# Patient Record
Sex: Female | Born: 1960 | Race: Black or African American | Hispanic: No | Smoking: Former smoker
Health system: Southern US, Community
[De-identification: ages and names within clinical notes are randomized; demographics above are authoritative.]

## PROBLEM LIST (undated history)

## (undated) DIAGNOSIS — E1142 Type 2 diabetes mellitus with diabetic polyneuropathy: Secondary | ICD-10-CM

## (undated) DIAGNOSIS — J449 Chronic obstructive pulmonary disease, unspecified: Secondary | ICD-10-CM

## (undated) DIAGNOSIS — R42 Dizziness and giddiness: Secondary | ICD-10-CM

## (undated) DIAGNOSIS — R799 Abnormal finding of blood chemistry, unspecified: Secondary | ICD-10-CM

## (undated) DIAGNOSIS — Z973 Presence of spectacles and contact lenses: Secondary | ICD-10-CM

## (undated) DIAGNOSIS — E785 Hyperlipidemia, unspecified: Secondary | ICD-10-CM

## (undated) DIAGNOSIS — S82852A Displaced trimalleolar fracture of left lower leg, initial encounter for closed fracture: Secondary | ICD-10-CM

## (undated) DIAGNOSIS — M797 Fibromyalgia: Secondary | ICD-10-CM

## (undated) DIAGNOSIS — T8859XA Other complications of anesthesia, initial encounter: Secondary | ICD-10-CM

## (undated) DIAGNOSIS — D573 Sickle-cell trait: Secondary | ICD-10-CM

## (undated) DIAGNOSIS — R011 Cardiac murmur, unspecified: Secondary | ICD-10-CM

## (undated) DIAGNOSIS — T4145XA Adverse effect of unspecified anesthetic, initial encounter: Secondary | ICD-10-CM

## (undated) DIAGNOSIS — J42 Unspecified chronic bronchitis: Secondary | ICD-10-CM

## (undated) DIAGNOSIS — E0500 Thyrotoxicosis with diffuse goiter without thyrotoxic crisis or storm: Secondary | ICD-10-CM

## (undated) DIAGNOSIS — F419 Anxiety disorder, unspecified: Secondary | ICD-10-CM

## (undated) DIAGNOSIS — H9191 Unspecified hearing loss, right ear: Secondary | ICD-10-CM

## (undated) DIAGNOSIS — E039 Hypothyroidism, unspecified: Secondary | ICD-10-CM

## (undated) DIAGNOSIS — E119 Type 2 diabetes mellitus without complications: Secondary | ICD-10-CM

## (undated) DIAGNOSIS — L0390 Cellulitis, unspecified: Secondary | ICD-10-CM

## (undated) DIAGNOSIS — I10 Essential (primary) hypertension: Secondary | ICD-10-CM

## (undated) DIAGNOSIS — A630 Anogenital (venereal) warts: Secondary | ICD-10-CM

## (undated) DIAGNOSIS — M1990 Unspecified osteoarthritis, unspecified site: Secondary | ICD-10-CM

## (undated) DIAGNOSIS — R51 Headache: Secondary | ICD-10-CM

## (undated) HISTORY — PX: FRACTURE SURGERY: SHX138

## (undated) HISTORY — DX: Anogenital (venereal) warts: A63.0

## (undated) HISTORY — DX: Abnormal finding of blood chemistry, unspecified: R79.9

## (undated) HISTORY — DX: Unspecified hearing loss, right ear: H91.91

---

## 1984-09-30 HISTORY — PX: APPENDECTOMY: SHX54

## 1989-05-31 HISTORY — PX: DILATION AND CURETTAGE OF UTERUS: SHX78

## 1998-01-17 ENCOUNTER — Encounter: Admission: RE | Admit: 1998-01-17 | Discharge: 1998-01-17 | Payer: Self-pay | Admitting: Internal Medicine

## 1998-01-18 ENCOUNTER — Encounter: Admission: RE | Admit: 1998-01-18 | Discharge: 1998-01-18 | Payer: Self-pay | Admitting: Hematology and Oncology

## 1998-01-27 ENCOUNTER — Encounter: Admission: RE | Admit: 1998-01-27 | Discharge: 1998-01-27 | Payer: Self-pay | Admitting: Internal Medicine

## 1998-01-31 ENCOUNTER — Encounter: Admission: RE | Admit: 1998-01-31 | Discharge: 1998-05-01 | Payer: Self-pay | Admitting: Surgery

## 1998-02-28 ENCOUNTER — Encounter: Admission: RE | Admit: 1998-02-28 | Discharge: 1998-02-28 | Payer: Self-pay | Admitting: Hematology and Oncology

## 1998-05-17 ENCOUNTER — Encounter: Admission: RE | Admit: 1998-05-17 | Discharge: 1998-05-17 | Payer: Self-pay | Admitting: Internal Medicine

## 1998-10-05 ENCOUNTER — Encounter: Admission: RE | Admit: 1998-10-05 | Discharge: 1998-10-05 | Payer: Self-pay | Admitting: Internal Medicine

## 1998-11-01 ENCOUNTER — Encounter: Admission: RE | Admit: 1998-11-01 | Discharge: 1998-11-01 | Payer: Self-pay | Admitting: Surgery

## 1998-11-03 ENCOUNTER — Ambulatory Visit (HOSPITAL_COMMUNITY): Admission: RE | Admit: 1998-11-03 | Discharge: 1998-11-03 | Payer: Self-pay | Admitting: Internal Medicine

## 1998-11-03 ENCOUNTER — Encounter: Payer: Self-pay | Admitting: Internal Medicine

## 1998-11-20 ENCOUNTER — Encounter: Admission: RE | Admit: 1998-11-20 | Discharge: 1998-11-20 | Payer: Self-pay | Admitting: Internal Medicine

## 1999-01-03 ENCOUNTER — Encounter: Admission: RE | Admit: 1999-01-03 | Discharge: 1999-01-03 | Payer: Self-pay | Admitting: Internal Medicine

## 1999-01-04 ENCOUNTER — Encounter: Admission: RE | Admit: 1999-01-04 | Discharge: 1999-01-04 | Payer: Self-pay | Admitting: Hematology and Oncology

## 1999-06-13 ENCOUNTER — Encounter: Admission: RE | Admit: 1999-06-13 | Discharge: 1999-06-13 | Payer: Self-pay | Admitting: Hematology and Oncology

## 1999-10-31 ENCOUNTER — Encounter: Admission: RE | Admit: 1999-10-31 | Discharge: 1999-10-31 | Payer: Self-pay | Admitting: Hematology and Oncology

## 2000-03-21 ENCOUNTER — Encounter: Admission: RE | Admit: 2000-03-21 | Discharge: 2000-03-21 | Payer: Self-pay | Admitting: Internal Medicine

## 2000-04-18 ENCOUNTER — Encounter: Admission: RE | Admit: 2000-04-18 | Discharge: 2000-04-18 | Payer: Self-pay | Admitting: Internal Medicine

## 2000-10-10 ENCOUNTER — Encounter: Admission: RE | Admit: 2000-10-10 | Discharge: 2000-10-10 | Payer: Self-pay | Admitting: Internal Medicine

## 2000-10-10 ENCOUNTER — Ambulatory Visit (HOSPITAL_COMMUNITY): Admission: RE | Admit: 2000-10-10 | Discharge: 2000-10-10 | Payer: Self-pay | Admitting: Internal Medicine

## 2000-10-29 ENCOUNTER — Encounter: Admission: RE | Admit: 2000-10-29 | Discharge: 2000-10-29 | Payer: Self-pay | Admitting: Hematology and Oncology

## 2000-10-29 ENCOUNTER — Encounter: Admission: RE | Admit: 2000-10-29 | Discharge: 2001-01-27 | Payer: Self-pay | Admitting: *Deleted

## 2000-11-14 ENCOUNTER — Encounter: Admission: RE | Admit: 2000-11-14 | Discharge: 2000-11-14 | Payer: Self-pay | Admitting: Internal Medicine

## 2001-01-16 ENCOUNTER — Encounter: Admission: RE | Admit: 2001-01-16 | Discharge: 2001-01-16 | Payer: Self-pay

## 2001-03-05 ENCOUNTER — Encounter: Admission: RE | Admit: 2001-03-05 | Discharge: 2001-03-05 | Payer: Self-pay | Admitting: Obstetrics

## 2001-05-25 ENCOUNTER — Encounter: Admission: RE | Admit: 2001-05-25 | Discharge: 2001-05-25 | Payer: Self-pay | Admitting: Internal Medicine

## 2001-10-06 ENCOUNTER — Encounter: Admission: RE | Admit: 2001-10-06 | Discharge: 2001-10-06 | Payer: Self-pay | Admitting: Obstetrics & Gynecology

## 2001-10-19 ENCOUNTER — Encounter: Admission: RE | Admit: 2001-10-19 | Discharge: 2001-10-19 | Payer: Self-pay

## 2002-07-13 ENCOUNTER — Encounter: Admission: RE | Admit: 2002-07-13 | Discharge: 2002-07-13 | Payer: Self-pay | Admitting: Internal Medicine

## 2002-07-20 ENCOUNTER — Encounter: Admission: RE | Admit: 2002-07-20 | Discharge: 2002-07-20 | Payer: Self-pay | Admitting: Internal Medicine

## 2002-08-10 ENCOUNTER — Encounter: Admission: RE | Admit: 2002-08-10 | Discharge: 2002-08-10 | Payer: Self-pay | Admitting: Internal Medicine

## 2002-12-28 ENCOUNTER — Encounter: Admission: RE | Admit: 2002-12-28 | Discharge: 2002-12-28 | Payer: Self-pay | Admitting: Internal Medicine

## 2002-12-28 ENCOUNTER — Ambulatory Visit (HOSPITAL_COMMUNITY): Admission: RE | Admit: 2002-12-28 | Discharge: 2002-12-28 | Payer: Self-pay | Admitting: Internal Medicine

## 2003-01-11 ENCOUNTER — Encounter: Admission: RE | Admit: 2003-01-11 | Discharge: 2003-01-11 | Payer: Self-pay | Admitting: Internal Medicine

## 2003-01-25 ENCOUNTER — Ambulatory Visit (HOSPITAL_COMMUNITY): Admission: RE | Admit: 2003-01-25 | Discharge: 2003-01-25 | Payer: Self-pay | Admitting: Internal Medicine

## 2003-02-04 ENCOUNTER — Encounter: Admission: RE | Admit: 2003-02-04 | Discharge: 2003-02-04 | Payer: Self-pay | Admitting: Internal Medicine

## 2003-03-11 ENCOUNTER — Encounter: Admission: RE | Admit: 2003-03-11 | Discharge: 2003-03-11 | Payer: Self-pay | Admitting: Internal Medicine

## 2003-05-03 ENCOUNTER — Encounter: Admission: RE | Admit: 2003-05-03 | Discharge: 2003-05-03 | Payer: Self-pay | Admitting: Internal Medicine

## 2003-05-12 ENCOUNTER — Ambulatory Visit (HOSPITAL_COMMUNITY): Admission: RE | Admit: 2003-05-12 | Discharge: 2003-05-12 | Payer: Self-pay | Admitting: Internal Medicine

## 2004-01-05 ENCOUNTER — Encounter: Admission: RE | Admit: 2004-01-05 | Discharge: 2004-01-05 | Payer: Self-pay | Admitting: Internal Medicine

## 2004-01-05 ENCOUNTER — Ambulatory Visit (HOSPITAL_COMMUNITY): Admission: RE | Admit: 2004-01-05 | Discharge: 2004-01-05 | Payer: Self-pay | Admitting: Internal Medicine

## 2004-01-10 ENCOUNTER — Encounter: Admission: RE | Admit: 2004-01-10 | Discharge: 2004-01-10 | Payer: Self-pay | Admitting: Internal Medicine

## 2004-02-16 ENCOUNTER — Encounter: Admission: RE | Admit: 2004-02-16 | Discharge: 2004-02-16 | Payer: Self-pay | Admitting: Internal Medicine

## 2004-02-22 ENCOUNTER — Encounter: Admission: RE | Admit: 2004-02-22 | Discharge: 2004-02-22 | Payer: Self-pay | Admitting: Internal Medicine

## 2004-04-09 ENCOUNTER — Encounter: Admission: RE | Admit: 2004-04-09 | Discharge: 2004-04-09 | Payer: Self-pay | Admitting: Internal Medicine

## 2004-04-09 ENCOUNTER — Ambulatory Visit: Payer: Self-pay | Admitting: Internal Medicine

## 2004-10-29 ENCOUNTER — Ambulatory Visit: Payer: Self-pay | Admitting: Family Medicine

## 2005-04-18 ENCOUNTER — Ambulatory Visit: Payer: Self-pay | Admitting: Internal Medicine

## 2005-09-17 ENCOUNTER — Ambulatory Visit: Payer: Self-pay | Admitting: Internal Medicine

## 2005-09-19 ENCOUNTER — Ambulatory Visit: Payer: Self-pay | Admitting: Internal Medicine

## 2005-12-05 ENCOUNTER — Ambulatory Visit: Payer: Self-pay | Admitting: Internal Medicine

## 2006-05-08 ENCOUNTER — Ambulatory Visit: Payer: Self-pay | Admitting: Hospitalist

## 2006-05-16 ENCOUNTER — Ambulatory Visit (HOSPITAL_COMMUNITY): Admission: RE | Admit: 2006-05-16 | Discharge: 2006-05-16 | Payer: Self-pay | Admitting: Hospitalist

## 2006-07-31 DIAGNOSIS — G4700 Insomnia, unspecified: Secondary | ICD-10-CM

## 2006-07-31 DIAGNOSIS — E1165 Type 2 diabetes mellitus with hyperglycemia: Secondary | ICD-10-CM

## 2006-07-31 DIAGNOSIS — R809 Proteinuria, unspecified: Secondary | ICD-10-CM

## 2006-07-31 DIAGNOSIS — J988 Other specified respiratory disorders: Secondary | ICD-10-CM | POA: Insufficient documentation

## 2006-07-31 DIAGNOSIS — F32A Depression, unspecified: Secondary | ICD-10-CM | POA: Insufficient documentation

## 2006-07-31 DIAGNOSIS — E1140 Type 2 diabetes mellitus with diabetic neuropathy, unspecified: Secondary | ICD-10-CM | POA: Insufficient documentation

## 2006-07-31 DIAGNOSIS — F329 Major depressive disorder, single episode, unspecified: Secondary | ICD-10-CM

## 2006-07-31 DIAGNOSIS — M25519 Pain in unspecified shoulder: Secondary | ICD-10-CM | POA: Insufficient documentation

## 2006-07-31 DIAGNOSIS — D573 Sickle-cell trait: Secondary | ICD-10-CM | POA: Insufficient documentation

## 2006-07-31 DIAGNOSIS — G5600 Carpal tunnel syndrome, unspecified upper limb: Secondary | ICD-10-CM | POA: Insufficient documentation

## 2006-07-31 DIAGNOSIS — M7150 Other bursitis, not elsewhere classified, unspecified site: Secondary | ICD-10-CM | POA: Insufficient documentation

## 2006-07-31 HISTORY — DX: Proteinuria, unspecified: R80.9

## 2006-07-31 HISTORY — DX: Insomnia, unspecified: G47.00

## 2006-08-14 ENCOUNTER — Ambulatory Visit: Payer: Self-pay | Admitting: Hospitalist

## 2006-08-14 LAB — CONVERTED CEMR LAB
BUN: 9 mg/dL (ref 6–23)
CO2: 24 meq/L (ref 19–32)
Calcium: 10.1 mg/dL (ref 8.4–10.5)
Chloride: 104 meq/L (ref 96–112)
Cholesterol: 147 mg/dL (ref 0–200)
Creatinine, Ser: 0.6 mg/dL (ref 0.40–1.20)
Glucose, Bld: 104 mg/dL — ABNORMAL HIGH (ref 70–99)
HDL: 52 mg/dL (ref >39)
LDL Cholesterol: 85 mg/dL (ref 0–99)
Potassium: 4 meq/L (ref 3.5–5.3)
Sodium: 136 meq/L (ref 135–145)
TSH: 0.092 microintl units/mL — ABNORMAL LOW (ref 0.350–5.50)
Total CHOL/HDL Ratio: 2.8
Triglycerides: 49 mg/dL (ref <150)
VLDL: 10 mg/dL (ref 0–40)

## 2006-09-30 HISTORY — PX: ANKLE FRACTURE SURGERY: SHX122

## 2006-10-01 ENCOUNTER — Emergency Department (HOSPITAL_COMMUNITY): Admission: EM | Admit: 2006-10-01 | Discharge: 2006-10-02 | Payer: Self-pay | Admitting: Emergency Medicine

## 2006-10-13 DIAGNOSIS — M1289 Other specific arthropathies, not elsewhere classified, multiple sites: Secondary | ICD-10-CM | POA: Insufficient documentation

## 2006-10-13 DIAGNOSIS — D649 Anemia, unspecified: Secondary | ICD-10-CM | POA: Insufficient documentation

## 2006-10-13 DIAGNOSIS — E890 Postprocedural hypothyroidism: Secondary | ICD-10-CM | POA: Insufficient documentation

## 2006-11-25 ENCOUNTER — Telehealth (INDEPENDENT_AMBULATORY_CARE_PROVIDER_SITE_OTHER): Payer: Self-pay | Admitting: *Deleted

## 2007-01-05 ENCOUNTER — Telehealth: Payer: Self-pay | Admitting: *Deleted

## 2007-03-03 ENCOUNTER — Encounter (INDEPENDENT_AMBULATORY_CARE_PROVIDER_SITE_OTHER): Payer: Self-pay | Admitting: Hospitalist

## 2007-03-24 ENCOUNTER — Ambulatory Visit: Payer: Self-pay | Admitting: Hospitalist

## 2007-03-24 LAB — CONVERTED CEMR LAB
Blood Glucose, Fingerstick: 102
Hgb A1c MFr Bld: 8.5 %

## 2007-03-30 DIAGNOSIS — E785 Hyperlipidemia, unspecified: Secondary | ICD-10-CM | POA: Insufficient documentation

## 2007-03-30 LAB — CONVERTED CEMR LAB
ALT: 14 units/L (ref 0–35)
AST: 15 units/L (ref 0–37)
Albumin: 4.3 g/dL (ref 3.5–5.2)
Alkaline Phosphatase: 109 units/L (ref 39–117)
BUN: 7 mg/dL (ref 6–23)
Basophils Absolute: 0.1 10*3/uL (ref 0.0–0.1)
Basophils Relative: 1 % (ref 0–1)
CO2: 24 meq/L (ref 19–32)
Calcium: 9.9 mg/dL (ref 8.4–10.5)
Chloride: 105 meq/L (ref 96–112)
Cholesterol: 189 mg/dL (ref 0–200)
Creatinine, Ser: 0.74 mg/dL (ref 0.40–1.20)
Creatinine, Urine: 174.3 mg/dL
Eosinophils Absolute: 0.1 10*3/uL (ref 0.0–0.7)
Eosinophils Relative: 1 % (ref 0–5)
Glucose, Bld: 78 mg/dL (ref 70–99)
HCT: 37.9 % (ref 36.0–46.0)
HDL: 51 mg/dL (ref >39)
Hemoglobin: 12.6 g/dL (ref 12.0–15.0)
LDL Cholesterol: 122 mg/dL — ABNORMAL HIGH (ref 0–99)
Lymphocytes Relative: 30 % (ref 12–46)
Lymphs Abs: 2.7 10*3/uL (ref 0.7–3.3)
MCHC: 33.2 g/dL (ref 30.0–36.0)
MCV: 78 fL (ref 78.0–100.0)
Microalb Creat Ratio: 191.6 mg/g — ABNORMAL HIGH (ref 0.0–30.0)
Microalb, Ur: 33.4 mg/dL — ABNORMAL HIGH (ref 0.00–1.89)
Monocytes Absolute: 0.6 10*3/uL (ref 0.2–0.7)
Monocytes Relative: 7 % (ref 3–11)
Neutro Abs: 5.7 10*3/uL (ref 1.7–7.7)
Neutrophils Relative %: 62 % (ref 43–77)
Platelets: 249 10*3/uL (ref 150–400)
Potassium: 3.7 meq/L (ref 3.5–5.3)
RBC: 4.86 M/uL (ref 3.87–5.11)
RDW: 15.6 % — ABNORMAL HIGH (ref 11.5–14.0)
Sodium: 139 meq/L (ref 135–145)
TSH: 20.086 microintl units/mL — ABNORMAL HIGH (ref 0.350–5.50)
Total Bilirubin: 1.3 mg/dL — ABNORMAL HIGH (ref 0.3–1.2)
Total CHOL/HDL Ratio: 3.7
Total Protein: 7.9 g/dL (ref 6.0–8.3)
Triglycerides: 82 mg/dL (ref <150)
VLDL: 16 mg/dL (ref 0–40)
WBC: 9.2 10*3/uL (ref 4.0–10.5)

## 2007-04-01 ENCOUNTER — Ambulatory Visit (HOSPITAL_COMMUNITY): Admission: RE | Admit: 2007-04-01 | Discharge: 2007-04-01 | Payer: Self-pay | Admitting: Internal Medicine

## 2007-09-09 ENCOUNTER — Telehealth: Payer: Self-pay | Admitting: *Deleted

## 2007-09-21 ENCOUNTER — Telehealth: Payer: Self-pay | Admitting: *Deleted

## 2007-12-08 ENCOUNTER — Ambulatory Visit: Payer: Self-pay | Admitting: Hospitalist

## 2007-12-08 DIAGNOSIS — G619 Inflammatory polyneuropathy, unspecified: Secondary | ICD-10-CM | POA: Insufficient documentation

## 2007-12-08 DIAGNOSIS — J40 Bronchitis, not specified as acute or chronic: Secondary | ICD-10-CM | POA: Insufficient documentation

## 2007-12-08 DIAGNOSIS — G622 Polyneuropathy due to other toxic agents: Secondary | ICD-10-CM

## 2007-12-08 LAB — CONVERTED CEMR LAB
Blood Glucose, Fingerstick: 168
Hgb A1c MFr Bld: 8.1 %

## 2007-12-14 ENCOUNTER — Encounter (INDEPENDENT_AMBULATORY_CARE_PROVIDER_SITE_OTHER): Payer: Self-pay | Admitting: Hospitalist

## 2007-12-16 ENCOUNTER — Ambulatory Visit: Payer: Self-pay | Admitting: Hospitalist

## 2007-12-18 LAB — CONVERTED CEMR LAB
ALT: 15 units/L (ref 0–35)
AST: 17 units/L (ref 0–37)
Albumin: 4.3 g/dL (ref 3.5–5.2)
Alkaline Phosphatase: 99 units/L (ref 39–117)
BUN: 12 mg/dL (ref 6–23)
CO2: 22 meq/L (ref 19–32)
Calcium: 10.1 mg/dL (ref 8.4–10.5)
Chloride: 105 meq/L (ref 96–112)
Cholesterol: 185 mg/dL (ref 0–200)
Creatinine, Ser: 0.65 mg/dL (ref 0.40–1.20)
Glucose, Bld: 154 mg/dL — ABNORMAL HIGH (ref 70–99)
HCT: 36.5 % (ref 36.0–46.0)
HDL: 56 mg/dL (ref >39)
Hemoglobin: 11.9 g/dL — ABNORMAL LOW (ref 12.0–15.0)
LDL Cholesterol: 118 mg/dL — ABNORMAL HIGH (ref 0–99)
MCHC: 32.6 g/dL (ref 30.0–36.0)
MCV: 80.6 fL (ref 78.0–100.0)
Platelets: 281 10*3/uL (ref 150–400)
Potassium: 4.2 meq/L (ref 3.5–5.3)
RBC: 4.53 M/uL (ref 3.87–5.11)
RDW: 14.8 % (ref 11.5–15.5)
Sodium: 136 meq/L (ref 135–145)
TSH: 13.379 microintl units/mL — ABNORMAL HIGH (ref 0.350–5.50)
Total Bilirubin: 1 mg/dL (ref 0.3–1.2)
Total CHOL/HDL Ratio: 3.3
Total Protein: 8 g/dL (ref 6.0–8.3)
Triglycerides: 57 mg/dL (ref <150)
VLDL: 11 mg/dL (ref 0–40)
WBC: 7.6 10*3/uL (ref 4.0–10.5)

## 2008-02-15 ENCOUNTER — Encounter (INDEPENDENT_AMBULATORY_CARE_PROVIDER_SITE_OTHER): Payer: Self-pay | Admitting: Hospitalist

## 2008-06-10 ENCOUNTER — Telehealth: Payer: Self-pay | Admitting: Internal Medicine

## 2008-06-13 ENCOUNTER — Encounter (INDEPENDENT_AMBULATORY_CARE_PROVIDER_SITE_OTHER): Payer: Self-pay | Admitting: Internal Medicine

## 2008-06-13 ENCOUNTER — Ambulatory Visit: Payer: Self-pay | Admitting: Internal Medicine

## 2008-06-13 ENCOUNTER — Encounter: Payer: Self-pay | Admitting: Internal Medicine

## 2008-06-13 LAB — CONVERTED CEMR LAB
Blood Glucose, AC Bkfst: 62 mg/dL
Hgb A1c MFr Bld: 10.2 %

## 2008-06-14 LAB — CONVERTED CEMR LAB
Basophils Absolute: 0.1 10*3/uL (ref 0.0–0.1)
Basophils Relative: 1 % (ref 0–1)
Creatinine, Urine: 177.1 mg/dL
Eosinophils Absolute: 0.1 10*3/uL (ref 0.0–0.7)
Eosinophils Relative: 1 % (ref 0–5)
HCT: 36.1 % (ref 36.0–46.0)
Hemoglobin: 11.6 g/dL — ABNORMAL LOW (ref 12.0–15.0)
Lymphocytes Relative: 16 % (ref 12–46)
Lymphs Abs: 1.5 10*3/uL (ref 0.7–4.0)
MCHC: 32.1 g/dL (ref 30.0–36.0)
MCV: 79.2 fL (ref 78.0–100.0)
Microalb Creat Ratio: 129.9 mg/g — ABNORMAL HIGH (ref 0.0–30.0)
Microalb, Ur: 23 mg/dL — ABNORMAL HIGH (ref 0.00–1.89)
Monocytes Absolute: 0.8 10*3/uL (ref 0.1–1.0)
Monocytes Relative: 8 % (ref 3–12)
Neutro Abs: 7.2 10*3/uL (ref 1.7–7.7)
Neutrophils Relative %: 75 % (ref 43–77)
Platelets: 246 10*3/uL (ref 150–400)
RBC: 4.56 M/uL (ref 3.87–5.11)
RDW: 15.7 % — ABNORMAL HIGH (ref 11.5–15.5)
TSH: 12.641 microintl units/mL — ABNORMAL HIGH (ref 0.350–4.50)
WBC: 9.6 10*3/uL (ref 4.0–10.5)

## 2008-06-15 ENCOUNTER — Telehealth (INDEPENDENT_AMBULATORY_CARE_PROVIDER_SITE_OTHER): Payer: Self-pay | Admitting: *Deleted

## 2008-06-20 ENCOUNTER — Telehealth: Payer: Self-pay | Admitting: Internal Medicine

## 2008-07-07 ENCOUNTER — Encounter: Payer: Self-pay | Admitting: Internal Medicine

## 2008-07-07 ENCOUNTER — Ambulatory Visit: Payer: Self-pay | Admitting: Infectious Disease

## 2008-07-07 LAB — CONVERTED CEMR LAB
Blood Glucose, Fingerstick: 100
Blood Glucose, Home Monitor: 1 mg/dL

## 2008-07-11 LAB — CONVERTED CEMR LAB
Cholesterol: 166 mg/dL (ref 0–200)
Free T4: 1.63 ng/dL (ref 0.89–1.80)
HDL: 60 mg/dL (ref >39)
IgE (Immunoglobulin E), Serum: 8.2 intl units/mL (ref 0.0–180.0)
LDL Cholesterol: 96 mg/dL (ref 0–99)
T3, Free: 2.4 pg/mL (ref 2.3–4.2)
Total CHOL/HDL Ratio: 2.8
Triglycerides: 48 mg/dL (ref <150)
VLDL: 10 mg/dL (ref 0–40)

## 2008-07-19 ENCOUNTER — Encounter: Payer: Self-pay | Admitting: Internal Medicine

## 2008-07-21 ENCOUNTER — Encounter: Payer: Self-pay | Admitting: Internal Medicine

## 2008-07-21 ENCOUNTER — Ambulatory Visit: Payer: Self-pay | Admitting: Internal Medicine

## 2008-07-21 LAB — CONVERTED CEMR LAB
Anti Nuclear Antibody(ANA): NEGATIVE
BUN: 6 mg/dL (ref 6–23)
Blood Glucose, Fingerstick: 171
CO2: 26 meq/L (ref 19–32)
Calcium: 9.6 mg/dL (ref 8.4–10.5)
Chloride: 103 meq/L (ref 96–112)
Creatinine, Ser: 0.55 mg/dL (ref 0.40–1.20)
Glucose, Bld: 167 mg/dL — ABNORMAL HIGH (ref 70–99)
Potassium: 3.6 meq/L (ref 3.5–5.3)
Sed Rate: 15 mm/hr (ref 0–22)
Sodium: 135 meq/L (ref 135–145)

## 2008-07-26 ENCOUNTER — Ambulatory Visit (HOSPITAL_COMMUNITY): Admission: RE | Admit: 2008-07-26 | Discharge: 2008-07-26 | Payer: Self-pay | Admitting: Internal Medicine

## 2008-07-27 ENCOUNTER — Ambulatory Visit (HOSPITAL_COMMUNITY): Admission: RE | Admit: 2008-07-27 | Discharge: 2008-07-27 | Payer: Self-pay | Admitting: Internal Medicine

## 2008-09-08 ENCOUNTER — Ambulatory Visit: Payer: Self-pay | Admitting: Internal Medicine

## 2008-09-08 DIAGNOSIS — E663 Overweight: Secondary | ICD-10-CM | POA: Insufficient documentation

## 2008-09-08 LAB — CONVERTED CEMR LAB
Blood Glucose, Fingerstick: 326
Hgb A1c MFr Bld: 9.2 %

## 2008-10-21 ENCOUNTER — Telehealth (INDEPENDENT_AMBULATORY_CARE_PROVIDER_SITE_OTHER): Payer: Self-pay | Admitting: *Deleted

## 2008-12-12 ENCOUNTER — Telehealth: Payer: Self-pay | Admitting: Internal Medicine

## 2009-01-17 ENCOUNTER — Ambulatory Visit: Payer: Self-pay | Admitting: Internal Medicine

## 2009-01-17 LAB — CONVERTED CEMR LAB
Bilirubin Urine: NEGATIVE
Glucose, Urine, Semiquant: >=1000
Ketones, urine, test strip: NEGATIVE
Nitrite: NEGATIVE
Protein, U semiquant: 30
Specific Gravity, Urine: 1.01
Urobilinogen, UA: 0.2
WBC Urine, dipstick: NEGATIVE
pH: 5.5

## 2009-03-07 ENCOUNTER — Telehealth: Payer: Self-pay | Admitting: Internal Medicine

## 2009-03-20 ENCOUNTER — Telehealth (INDEPENDENT_AMBULATORY_CARE_PROVIDER_SITE_OTHER): Payer: Self-pay | Admitting: *Deleted

## 2009-06-21 ENCOUNTER — Encounter: Payer: Self-pay | Admitting: Internal Medicine

## 2009-06-21 ENCOUNTER — Ambulatory Visit: Payer: Self-pay | Admitting: Internal Medicine

## 2009-06-21 DIAGNOSIS — K644 Residual hemorrhoidal skin tags: Secondary | ICD-10-CM | POA: Insufficient documentation

## 2009-06-21 LAB — CONVERTED CEMR LAB
Beta hcg, urine, semiquantitative: NEGATIVE
Bilirubin Urine: NEGATIVE
Blood Glucose, Fingerstick: 307
Glucose, Urine, Semiquant: >=1000
Hgb A1c MFr Bld: 10.7 %
Nitrite: POSITIVE
Specific Gravity, Urine: 1.02
Urobilinogen, UA: 0.2
WBC Urine, dipstick: NEGATIVE
pH: 5

## 2009-06-22 ENCOUNTER — Encounter: Payer: Self-pay | Admitting: Internal Medicine

## 2009-06-22 LAB — CONVERTED CEMR LAB
Candida species: NEGATIVE
Chlamydia, DNA Probe: NEGATIVE
GC Probe Amp, Genital: NEGATIVE
Gardnerella vaginalis: NEGATIVE

## 2009-06-26 ENCOUNTER — Telehealth (INDEPENDENT_AMBULATORY_CARE_PROVIDER_SITE_OTHER): Payer: Self-pay | Admitting: *Deleted

## 2009-06-26 DIAGNOSIS — R87619 Unspecified abnormal cytological findings in specimens from cervix uteri: Secondary | ICD-10-CM | POA: Insufficient documentation

## 2009-06-28 ENCOUNTER — Telehealth: Payer: Self-pay | Admitting: *Deleted

## 2009-07-05 ENCOUNTER — Ambulatory Visit: Payer: Self-pay | Admitting: Internal Medicine

## 2009-07-05 ENCOUNTER — Encounter: Payer: Self-pay | Admitting: Internal Medicine

## 2009-07-05 LAB — CONVERTED CEMR LAB: Blood Glucose, Fingerstick: 170

## 2009-07-06 ENCOUNTER — Encounter: Payer: Self-pay | Admitting: Internal Medicine

## 2009-07-06 LAB — CONVERTED CEMR LAB
Candida species: NEGATIVE
Gardnerella vaginalis: NEGATIVE

## 2009-07-19 ENCOUNTER — Other Ambulatory Visit: Admission: RE | Admit: 2009-07-19 | Discharge: 2009-07-19 | Payer: Self-pay | Admitting: Obstetrics and Gynecology

## 2009-07-19 ENCOUNTER — Ambulatory Visit: Payer: Self-pay | Admitting: Obstetrics and Gynecology

## 2009-10-19 ENCOUNTER — Telehealth: Payer: Self-pay | Admitting: Internal Medicine

## 2009-10-27 ENCOUNTER — Telehealth: Payer: Self-pay | Admitting: Internal Medicine

## 2009-11-13 ENCOUNTER — Ambulatory Visit: Payer: Self-pay | Admitting: Internal Medicine

## 2009-11-13 LAB — CONVERTED CEMR LAB
ALT: 14 units/L (ref 0–35)
AST: 18 units/L (ref 0–37)
Albumin: 4 g/dL (ref 3.5–5.2)
Alkaline Phosphatase: 95 units/L (ref 39–117)
BUN: 7 mg/dL (ref 6–23)
Basophils Absolute: 0 10*3/uL (ref 0.0–0.1)
Basophils Relative: 1 % (ref 0–1)
Bilirubin Urine: NEGATIVE
Blood Glucose, AC Bkfst: 222 mg/dL
CO2: 23 meq/L (ref 19–32)
Calcium: 9.7 mg/dL (ref 8.4–10.5)
Casts: NONE SEEN /lpf
Chloride: 104 meq/L (ref 96–112)
Cholesterol: 174 mg/dL (ref 0–200)
Creatinine, Ser: 0.59 mg/dL (ref 0.40–1.20)
Creatinine, Urine: 121.3 mg/dL
Crystals: NONE SEEN
Eosinophils Absolute: 0.2 10*3/uL (ref 0.0–0.7)
Eosinophils Relative: 3 % (ref 0–5)
Glucose, Bld: 220 mg/dL — ABNORMAL HIGH (ref 70–99)
HCT: 30.9 % — ABNORMAL LOW (ref 36.0–46.0)
HDL: 46 mg/dL (ref >39)
Hemoglobin: 9.8 g/dL — ABNORMAL LOW (ref 12.0–15.0)
Hgb A1c MFr Bld: 8.4 %
Ketones, ur: 15 mg/dL — AB
LDL Cholesterol: 113 mg/dL — ABNORMAL HIGH (ref 0–99)
Leukocytes, UA: NEGATIVE
Lymphocytes Relative: 24 % (ref 12–46)
Lymphs Abs: 1.9 10*3/uL (ref 0.7–4.0)
MCHC: 31.7 g/dL (ref 30.0–36.0)
MCV: 71.4 fL — ABNORMAL LOW (ref >=78.0)
Microalb Creat Ratio: 135.4 mg/g — ABNORMAL HIGH (ref 0.0–30.0)
Microalb, Ur: 16.43 mg/dL — ABNORMAL HIGH (ref 0.00–1.89)
Monocytes Absolute: 0.7 10*3/uL (ref 0.1–1.0)
Monocytes Relative: 8 % (ref 3–12)
Neutro Abs: 5.1 10*3/uL (ref 1.7–7.7)
Neutrophils Relative %: 65 % (ref 43–77)
Nitrite: POSITIVE — AB
Platelets: 384 10*3/uL (ref 150–400)
Potassium: 4.3 meq/L (ref 3.5–5.3)
Protein, ur: 100 mg/dL — AB
RBC: 4.33 M/uL (ref 3.87–5.11)
RDW: 18 % — ABNORMAL HIGH (ref 11.5–15.5)
Sodium: 138 meq/L (ref 135–145)
Specific Gravity, Urine: 1.017 (ref 1.005–1.0)
Total Bilirubin: 0.9 mg/dL (ref 0.3–1.2)
Total CHOL/HDL Ratio: 3.8
Total Protein: 7.5 g/dL (ref 6.0–8.3)
Triglycerides: 73 mg/dL (ref <150)
Urine Glucose: >1000 mg/dL — AB
Urobilinogen, UA: 1 (ref 0.0–1.0)
VLDL: 15 mg/dL (ref 0–40)
WBC: 7.9 10*3/uL (ref 4.0–10.5)
pH: 5.5 (ref 5.0–8.0)

## 2009-12-12 ENCOUNTER — Telehealth: Payer: Self-pay | Admitting: *Deleted

## 2009-12-25 ENCOUNTER — Telehealth: Payer: Self-pay | Admitting: Internal Medicine

## 2009-12-28 ENCOUNTER — Telehealth: Payer: Self-pay | Admitting: Internal Medicine

## 2010-01-18 ENCOUNTER — Ambulatory Visit: Payer: Self-pay | Admitting: Obstetrics & Gynecology

## 2010-01-23 ENCOUNTER — Ambulatory Visit (HOSPITAL_COMMUNITY): Admission: RE | Admit: 2010-01-23 | Discharge: 2010-01-23 | Payer: Self-pay | Admitting: Obstetrics & Gynecology

## 2010-02-08 ENCOUNTER — Ambulatory Visit: Payer: Self-pay | Admitting: Internal Medicine

## 2010-02-08 LAB — CONVERTED CEMR LAB
ALT: 17 units/L (ref 0–35)
ANA Titer 1: 1:1280 {titer} — ABNORMAL HIGH
AST: 21 units/L (ref 0–37)
Albumin: 4.6 g/dL (ref 3.5–5.2)
Alkaline Phosphatase: 64 units/L (ref 39–117)
Anti Nuclear Antibody(ANA): POSITIVE — AB
BUN: 6 mg/dL (ref 6–23)
Basophils Absolute: 0.1 10*3/uL (ref 0.0–0.1)
Basophils Relative: 2 % — ABNORMAL HIGH (ref 0–1)
Blood Glucose, AC Bkfst: 81 mg/dL
CO2: 23 meq/L (ref 19–32)
Calcium: 10.4 mg/dL (ref 8.4–10.5)
Chloride: 101 meq/L (ref 96–112)
Creatinine, Ser: 0.74 mg/dL (ref 0.40–1.20)
Eosinophils Absolute: 0.3 10*3/uL (ref 0.0–0.7)
Eosinophils Relative: 5 % (ref 0–5)
Free T4: 0.48 ng/dL — ABNORMAL LOW (ref 0.80–1.80)
Glucose, Bld: 101 mg/dL — ABNORMAL HIGH (ref 70–99)
HCT: 31.9 % — ABNORMAL LOW (ref 36.0–46.0)
Hemoglobin: 10 g/dL — ABNORMAL LOW (ref 12.0–15.0)
Hgb A1c MFr Bld: 6.5 %
Lymphocytes Relative: 18 % (ref 12–46)
Lymphs Abs: 1 10*3/uL (ref 0.7–4.0)
MCHC: 31.3 g/dL (ref 30.0–36.0)
MCV: 71.4 fL — ABNORMAL LOW (ref >=78.0)
Monocytes Absolute: 0.5 10*3/uL (ref 0.1–1.0)
Monocytes Relative: 9 % (ref 3–12)
Neutro Abs: 4 10*3/uL (ref 1.7–7.7)
Neutrophils Relative %: 67 % (ref 43–77)
Platelets: 356 10*3/uL (ref 150–400)
Potassium: 3.9 meq/L (ref 3.5–5.3)
RBC: 4.47 M/uL (ref 3.87–5.11)
RDW: 18.6 % — ABNORMAL HIGH (ref 11.5–15.5)
Sodium: 137 meq/L (ref 135–145)
TSH: 69.887 microintl units/mL — ABNORMAL HIGH (ref 0.350–4.5)
Total Bilirubin: 1.7 mg/dL — ABNORMAL HIGH (ref 0.3–1.2)
Total Protein: 7.7 g/dL (ref 6.0–8.3)
Vitamin B-12: 497 pg/mL (ref 211–911)
WBC: 5.9 10*3/uL (ref 4.0–10.5)

## 2010-02-09 ENCOUNTER — Telehealth: Payer: Self-pay | Admitting: Internal Medicine

## 2010-02-09 ENCOUNTER — Encounter: Payer: Self-pay | Admitting: Internal Medicine

## 2010-02-09 DIAGNOSIS — R799 Abnormal finding of blood chemistry, unspecified: Secondary | ICD-10-CM

## 2010-02-09 HISTORY — DX: Abnormal finding of blood chemistry, unspecified: R79.9

## 2010-03-12 ENCOUNTER — Observation Stay (HOSPITAL_COMMUNITY): Admission: EM | Admit: 2010-03-12 | Discharge: 2010-03-12 | Payer: Self-pay | Admitting: Emergency Medicine

## 2010-03-12 ENCOUNTER — Emergency Department (HOSPITAL_COMMUNITY): Admission: EM | Admit: 2010-03-12 | Discharge: 2010-03-12 | Payer: Self-pay | Admitting: Emergency Medicine

## 2010-03-20 ENCOUNTER — Telehealth: Payer: Self-pay | Admitting: Internal Medicine

## 2010-03-20 ENCOUNTER — Telehealth: Payer: Self-pay | Admitting: *Deleted

## 2010-03-26 ENCOUNTER — Telehealth: Payer: Self-pay | Admitting: Internal Medicine

## 2010-03-29 ENCOUNTER — Telehealth: Payer: Self-pay | Admitting: Internal Medicine

## 2010-04-23 ENCOUNTER — Ambulatory Visit: Payer: Self-pay | Admitting: Internal Medicine

## 2010-04-23 LAB — CONVERTED CEMR LAB
ALT: 11 units/L (ref 0–35)
AST: 15 units/L (ref 0–37)
Albumin: 4.6 g/dL (ref 3.5–5.2)
Alkaline Phosphatase: 99 units/L (ref 39–117)
BUN: 6 mg/dL (ref 6–23)
Basophils Absolute: 0.1 10*3/uL (ref 0.0–0.1)
Basophils Relative: 1 % (ref 0–1)
CO2: 19 meq/L (ref 19–32)
Calcium: 10.6 mg/dL — ABNORMAL HIGH (ref 8.4–10.5)
Chloride: 103 meq/L (ref 96–112)
Creatinine, Ser: 0.8 mg/dL (ref 0.40–1.20)
ENA SM Ab Ser-aCnc: <1 (ref <30)
Eosinophils Absolute: 0.1 10*3/uL (ref 0.0–0.7)
Eosinophils Relative: 1 % (ref 0–5)
Free T4: 1.99 ng/dL — ABNORMAL HIGH (ref 0.80–1.80)
Glucose, Bld: 123 mg/dL — ABNORMAL HIGH (ref 70–99)
HCT: 29.3 % — ABNORMAL LOW (ref 36.0–46.0)
Hemoglobin: 9.2 g/dL — ABNORMAL LOW (ref 12.0–15.0)
Lymphocytes Relative: 22 % (ref 12–46)
Lymphs Abs: 2.1 10*3/uL (ref 0.7–4.0)
MCHC: 31.4 g/dL (ref 30.0–36.0)
MCV: 73.8 fL — ABNORMAL LOW (ref >=78.0)
Monocytes Absolute: 0.8 10*3/uL (ref 0.1–1.0)
Monocytes Relative: 8 % (ref 3–12)
Neutro Abs: 6.7 10*3/uL (ref 1.7–7.7)
Neutrophils Relative %: 69 % (ref 43–77)
Platelets: 390 10*3/uL (ref 150–400)
Potassium: 4.6 meq/L (ref 3.5–5.3)
RBC: 3.97 M/uL (ref 3.87–5.11)
RDW: 17 % — ABNORMAL HIGH (ref 11.5–15.5)
Sodium: 137 meq/L (ref 135–145)
T3, Free: 2.3 pg/mL (ref 2.3–4.2)
TSH: 3.895 microintl units/mL (ref 0.350–4.5)
Total Bilirubin: 1.2 mg/dL (ref 0.3–1.2)
Total Protein: 8 g/dL (ref 6.0–8.3)
WBC: 9.8 10*3/uL (ref 4.0–10.5)
ds DNA Ab: 3 (ref <30)

## 2010-05-04 ENCOUNTER — Telehealth: Payer: Self-pay | Admitting: *Deleted

## 2010-05-07 ENCOUNTER — Ambulatory Visit: Payer: Self-pay | Admitting: Internal Medicine

## 2010-05-07 LAB — CONVERTED CEMR LAB
Beta hcg, urine, semiquantitative: NEGATIVE
Hep B Core Total Ab: NEGATIVE
Mumps IgG: 1.64 — ABNORMAL HIGH
Rubella: 38.4 intl units/mL — ABNORMAL HIGH
Rubeola IgG: 5.58 — ABNORMAL HIGH

## 2010-05-08 ENCOUNTER — Telehealth: Payer: Self-pay | Admitting: *Deleted

## 2010-05-14 ENCOUNTER — Telehealth: Payer: Self-pay | Admitting: Internal Medicine

## 2010-05-16 ENCOUNTER — Ambulatory Visit (HOSPITAL_COMMUNITY): Admission: RE | Admit: 2010-05-16 | Discharge: 2010-05-16 | Payer: Self-pay | Admitting: Internal Medicine

## 2010-05-22 ENCOUNTER — Ambulatory Visit: Payer: Self-pay | Admitting: Internal Medicine

## 2010-05-22 ENCOUNTER — Encounter: Payer: Self-pay | Admitting: Internal Medicine

## 2010-05-22 LAB — CONVERTED CEMR LAB
Blood Glucose, Fingerstick: 176
Chlamydia, DNA Probe: NEGATIVE
GC Probe Amp, Genital: NEGATIVE

## 2010-05-23 ENCOUNTER — Telehealth: Payer: Self-pay | Admitting: Internal Medicine

## 2010-05-23 ENCOUNTER — Encounter: Payer: Self-pay | Admitting: Internal Medicine

## 2010-05-23 LAB — CONVERTED CEMR LAB
Candida species: NEGATIVE
Gardnerella vaginalis: NEGATIVE

## 2010-05-24 ENCOUNTER — Ambulatory Visit: Payer: Self-pay | Admitting: Internal Medicine

## 2010-05-24 LAB — CONVERTED CEMR LAB
ALT: 8 units/L (ref 0–35)
AST: 15 units/L (ref 0–37)
Albumin: 4.2 g/dL (ref 3.5–5.2)
Alkaline Phosphatase: 85 units/L (ref 39–117)
BUN: 7 mg/dL (ref 6–23)
Basophils Absolute: 0.1 10*3/uL (ref 0.0–0.1)
Basophils Relative: 1 % (ref 0–1)
Blood Glucose, AC Bkfst: 116 mg/dL
CO2: 23 meq/L (ref 19–32)
Calcium: 10 mg/dL (ref 8.4–10.5)
Chloride: 103 meq/L (ref 96–112)
Creatinine, Ser: 0.67 mg/dL (ref 0.40–1.20)
Eosinophils Absolute: 0.2 10*3/uL (ref 0.0–0.7)
Eosinophils Relative: 3 % (ref 0–5)
Glucose, Bld: 104 mg/dL — ABNORMAL HIGH (ref 70–99)
HCT: 30.1 % — ABNORMAL LOW (ref 36.0–46.0)
Hemoglobin: 9.4 g/dL — ABNORMAL LOW (ref 12.0–15.0)
Hgb A1c MFr Bld: 7 %
Lymphocytes Relative: 23 % (ref 12–46)
Lymphs Abs: 1.9 10*3/uL (ref 0.7–4.0)
MCHC: 31.2 g/dL (ref 30.0–36.0)
MCV: 70.7 fL — ABNORMAL LOW (ref >=78.0)
Monocytes Absolute: 0.6 10*3/uL (ref 0.1–1.0)
Monocytes Relative: 7 % (ref 3–12)
Neutro Abs: 5.3 10*3/uL (ref 1.7–7.7)
Neutrophils Relative %: 67 % (ref 43–77)
Platelets: 397 10*3/uL (ref 150–400)
Potassium: 4.1 meq/L (ref 3.5–5.3)
RBC: 4.26 M/uL (ref 3.87–5.11)
RDW: 16.4 % — ABNORMAL HIGH (ref 11.5–15.5)
Sodium: 137 meq/L (ref 135–145)
TSH: 24.085 microintl units/mL — ABNORMAL HIGH (ref 0.350–4.5)
Total Bilirubin: 0.9 mg/dL (ref 0.3–1.2)
Total Protein: 7.4 g/dL (ref 6.0–8.3)
WBC: 8 10*3/uL (ref 4.0–10.5)

## 2010-07-13 ENCOUNTER — Encounter: Payer: Self-pay | Admitting: Internal Medicine

## 2010-08-21 ENCOUNTER — Telehealth: Payer: Self-pay | Admitting: Internal Medicine

## 2010-08-28 ENCOUNTER — Telehealth: Payer: Self-pay | Admitting: Internal Medicine

## 2010-08-30 ENCOUNTER — Telehealth (INDEPENDENT_AMBULATORY_CARE_PROVIDER_SITE_OTHER): Payer: Self-pay | Admitting: *Deleted

## 2010-08-30 ENCOUNTER — Ambulatory Visit: Payer: Self-pay | Admitting: Internal Medicine

## 2010-08-30 LAB — CONVERTED CEMR LAB
Basophils Absolute: 0.1 10*3/uL (ref 0.0–0.1)
Basophils Relative: 1 % (ref 0–1)
Blood Glucose, Fingerstick: 64
Eosinophils Absolute: 0.2 10*3/uL (ref 0.0–0.7)
Eosinophils Relative: 2 % (ref 0–5)
Free T4: 1.2 ng/dL (ref 0.80–1.80)
HCT: 29.1 % — ABNORMAL LOW (ref 36.0–46.0)
Hemoglobin: 9 g/dL — ABNORMAL LOW (ref 12.0–15.0)
Hgb A1c MFr Bld: 7.1 %
Lymphocytes Relative: 24 % (ref 12–46)
Lymphs Abs: 1.8 10*3/uL (ref 0.7–4.0)
MCHC: 30.9 g/dL (ref 30.0–36.0)
MCV: 66.3 fL — ABNORMAL LOW (ref >=78.0)
Monocytes Absolute: 0.5 10*3/uL (ref 0.1–1.0)
Monocytes Relative: 7 % (ref 3–12)
Neutro Abs: 5.1 10*3/uL (ref 1.7–7.7)
Neutrophils Relative %: 67 % (ref 43–77)
Platelets: 424 10*3/uL — ABNORMAL HIGH (ref 150–400)
RBC: 4.39 M/uL (ref 3.87–5.11)
RDW: 19.1 % — ABNORMAL HIGH (ref 11.5–15.5)
TSH: 14.457 microintl units/mL — ABNORMAL HIGH (ref 0.350–4.5)
WBC: 7.6 10*3/uL (ref 4.0–10.5)

## 2010-09-27 ENCOUNTER — Telehealth: Payer: Self-pay | Admitting: *Deleted

## 2010-10-21 IMAGING — CT CT CHEST W/O CM
2 of 7 series · 15 of 36 positions shown, 18 images · non-contrast
Comparison: None

CLINICAL DATA: Chronic cough

CT CHEST WITHOUT CONTRAST
TECHNIQUE: Multidetector CT imaging of the chest was performed
following the standard protocol without IV contrast.

[Series 6: chest w/o 5.0 st · axial · non-contrast · 0.60mm/px · z∈[+1134,+1364]mm · 12 of 54 slices shown, 15 images]
[im 4/54  mediastinal]
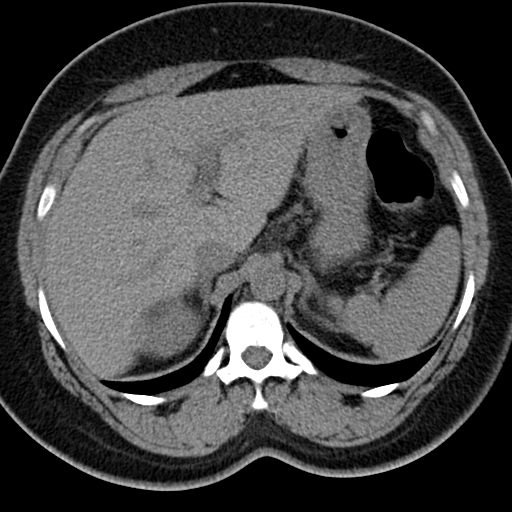
[im 4/54  lung]
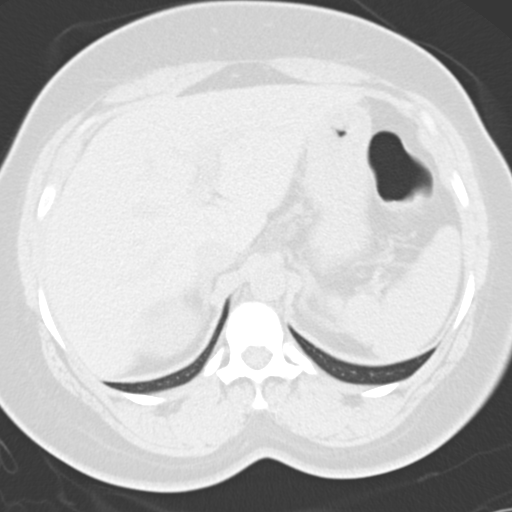
[im 8/54  lung]
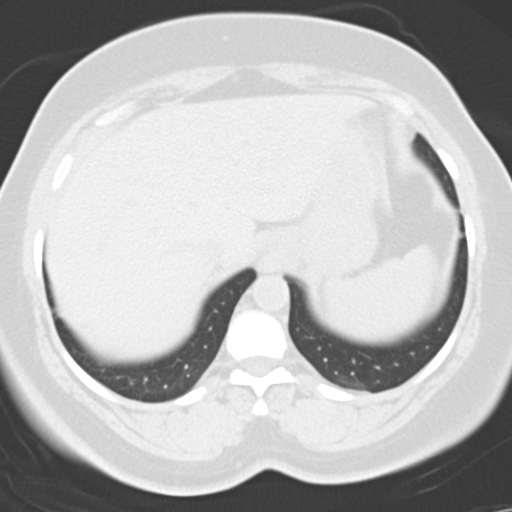
[im 12/54  lung]
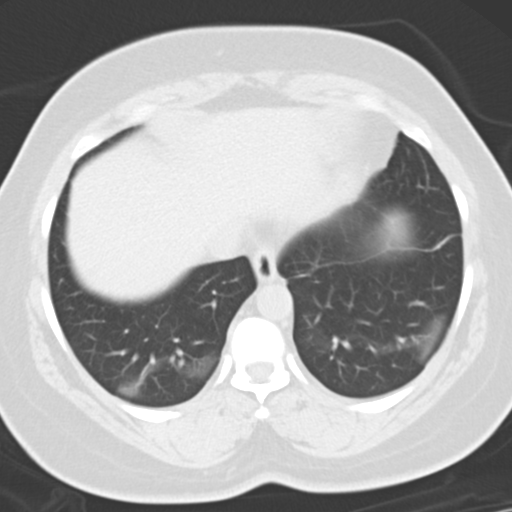
[im 16/54  lung]
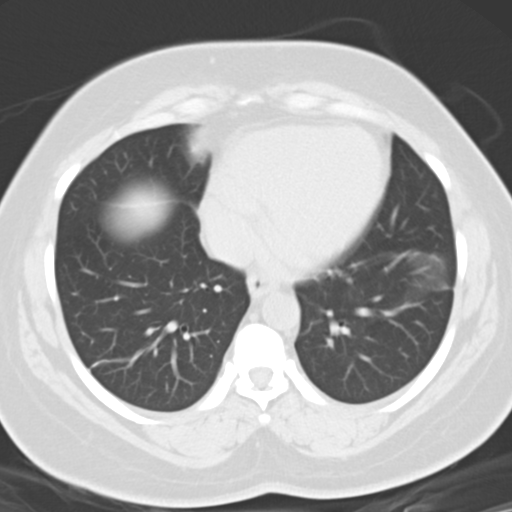
[im 19/54  mediastinal]
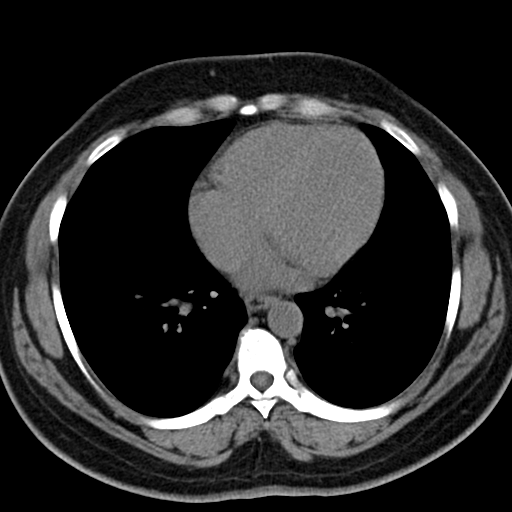
[im 19/54  lung]
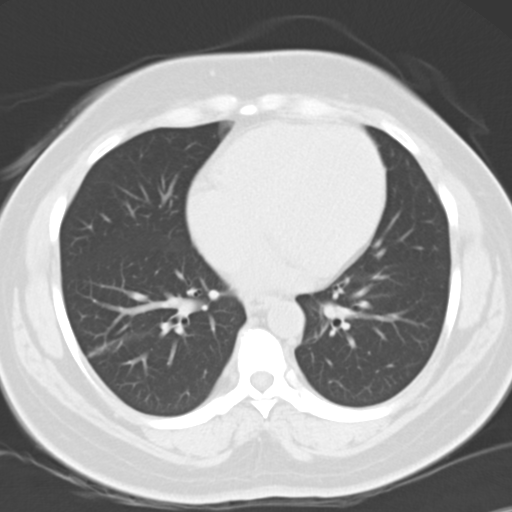
[im 23/54  lung]
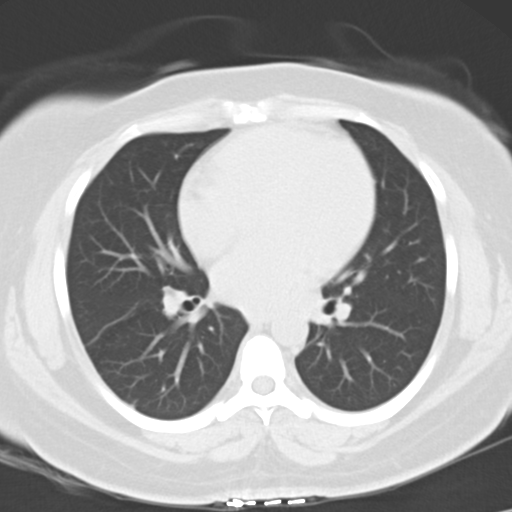
[im 31/54  lung]
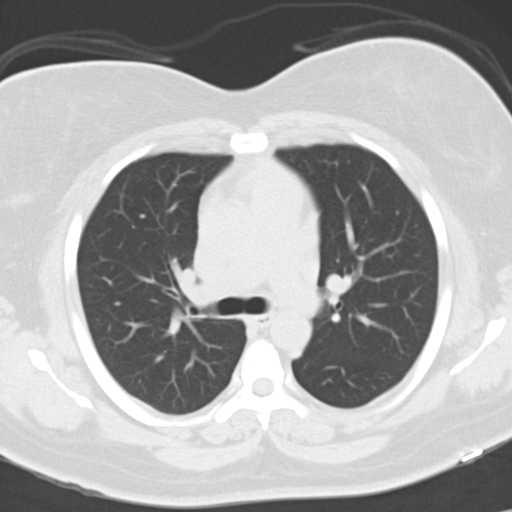
[im 35/54  lung]
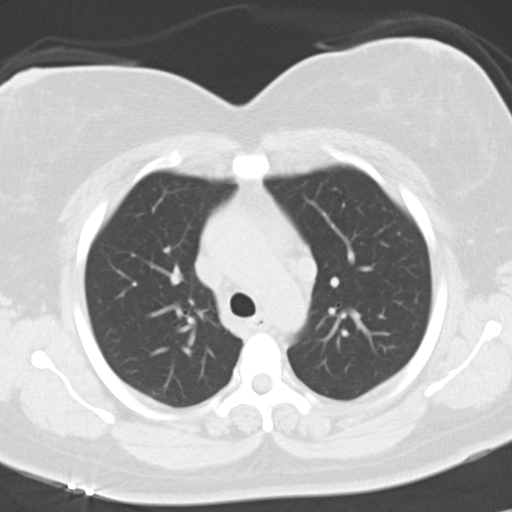
[im 38/54  mediastinal]
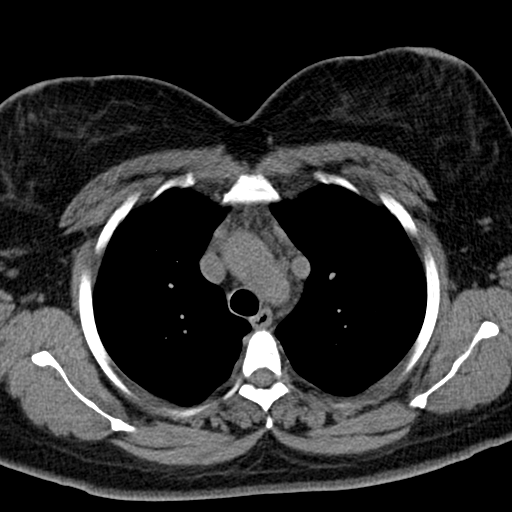
[im 38/54  lung]
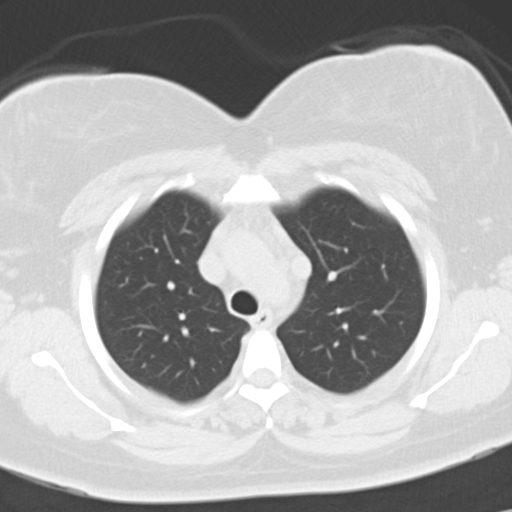
[im 42/54  lung]
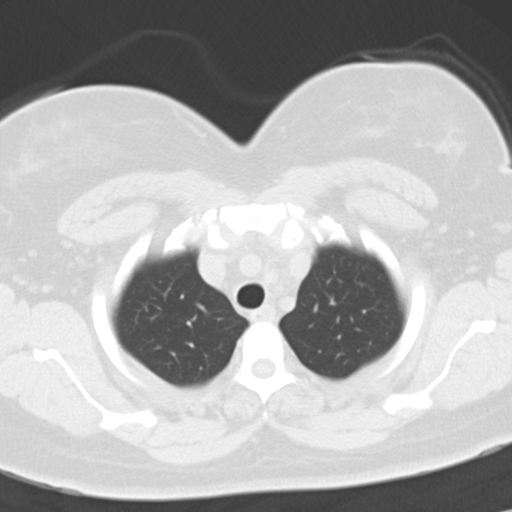
[im 46/54  lung]
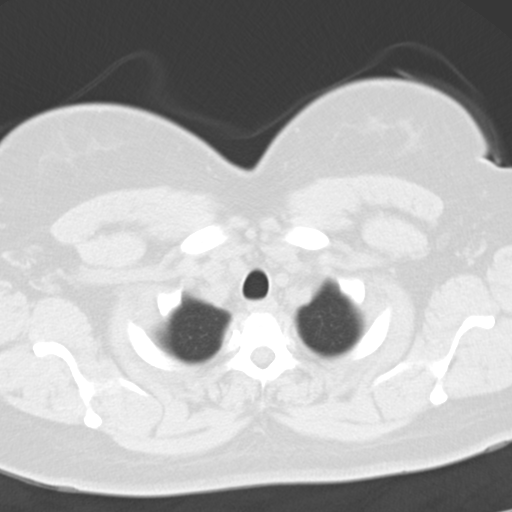
[im 50/54  lung]
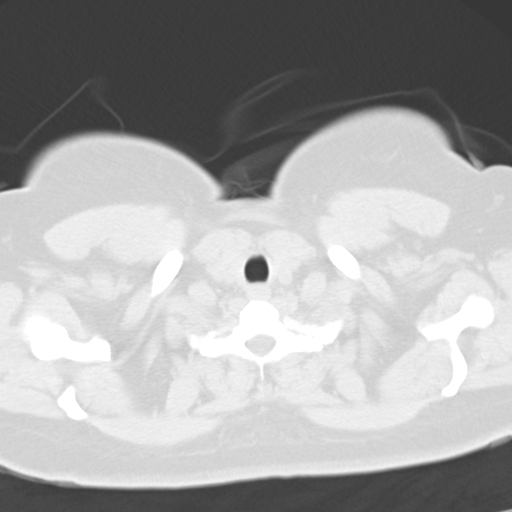

[Series 602: coronal · coronal · 0.60mm/px · 3 of 69 slices shown]
[im 14/69  lung]
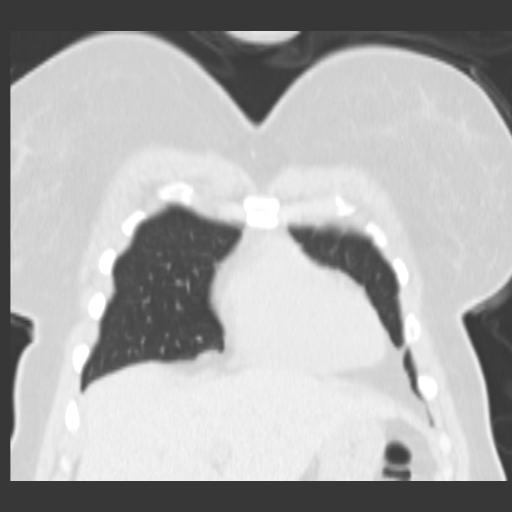
[im 28/69  lung]
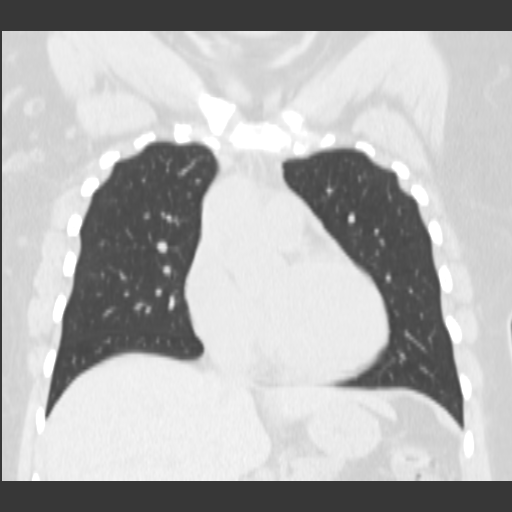
[im 41/69  lung]
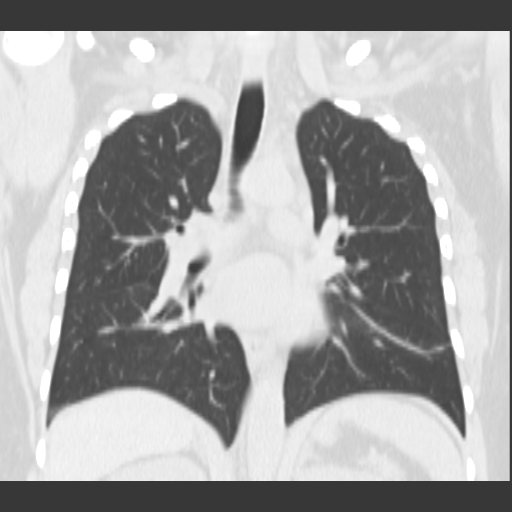

[15 of 36 positions shown; findings below may reference images not displayed]

FINDINGS: No enlarged axillary or supraclavicular lymph nodes.

There is a left sided superior vena cava.

No enlarged mediastinal or hilar lymph nodes.

No pericardial or pleural fluid.

Scar versus subsegmental atelectasis is noted at the lung bases.

The lungs are otherwise clear.  No pulmonary nodule or mass.

High resolution images shows no features to suggest interstitial
lung disease.  There is no honeycombing or bronchiectasis.

Review of the upper abdomen is unremarkable.
IMPRESSION: 1.  No active cardiopulmonary disease.
2.  No evidence for interstitial lung disease.
3.  Subsegmental atelectasis versus scarring at the lung bases.

## 2010-10-30 NOTE — Assessment & Plan Note (Signed)
Summary: acute-vaginal problems(golding)/cfb   Vital Signs:  Patient profile:   50 year old female Height:      63 inches (160.02 cm) Weight:      170.8 pounds (77.64 kg) BMI:     30.37 Temp:     98.3 degrees F oral Pulse rate:   61 / minute BP sitting:   150 / 73  (right arm)  Vitals Entered By: Morrison Old RN (May 22, 2010 2:45 PM) CC: Vaginal "bumps".  Right  "ovary"  pain. Also still has vaginal itching. Is Patient Diabetic? Yes Did you bring your meter with you today? No Pain Assessment Patient in pain? yes     Location: vaginal Intensity: 9 Type: burning/throbbing Onset of pain  Intermittent Nutritional Status BMI of > 30 = obese CBG Result 176  Have you ever been in a relationship where you felt threatened, hurt or afraid?No   Does patient need assistance? Functional Status Self care Ambulation Normal   Primary Care Provider:  Rhea Pink  DO  CC:  Vaginal "bumps".  Right  "ovary"  pain. Also still has vaginal itching.Marland Kitchen  History of Present Illness: 1. C/o painful and "itchy" "boil" to her vaginal labia for 4 days. Denies any dysuria, rash, fever, chills, abdominal or back pain. Denies other concerns.  Depression History:      The patient denies a depressed mood most of the day and a diminished interest in her usual daily activities.         Preventive Screening-Counseling & Management  Alcohol-Tobacco     Alcohol drinks/day: 0     Smoking Status: quit     Year Quit: 1990's  Caffeine-Diet-Exercise     Does Patient Exercise: yes     Type of exercise: walking     Times/week: 3  Problems Prior to Update: 1)  Monilial Vaginitis  (ICD-112.1) 2)  Bacterial Vaginitis  (ICD-616.10) 3)  Need Proph Vacc&inoculat Agnst Oth Spec Disease  (ICD-V05.8) 4)  Hepatitis B Immunity  (ICD-V05.8) 5)  Ana Positive  (ICD-790.99) 6)  Urinary Tract Infection  (ICD-599.0) 7)  Pap Smear, Abnormal  (ICD-795.00) 8)  Vaginal Discharge  (ICD-623.5) 9)  External  Hemorrhoids  (ICD-455.3) 10)  Obesity, Mild  (ICD-278.02) 11)  Diabetes Mellitus, Type II  (ICD-250.00) 12)  Proteinuria  (ICD-791.0) 13)  Neuropathy, Nos  (ICD-357.9) 14)  Hyperlipidemia  (ICD-272.4) 15)  Hypothyroidism  (ICD-244.9) 16)  Anemia-nos  (ICD-285.9) 17)  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) 18)  Airway Obstruction  (ICD-519.8) 19)  Bronchitis Not Specified As Acute or Chronic  (ICD-490) 20)  Shoulder Pain  (ICD-719.41) 21)  Bursitis  (ICD-727.3) 22)  Depression  (ICD-311) 23)  Insomnia  (ICD-780.52) 24)  Arthropathy Nec, 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) 25)  Carpal Tunnel Syndrome  (ICD-354.0)  Current Problems (verified): 1)  Vulvar Abscess  (ICD-616.4) 2)  Monilial Vaginitis  (ICD-112.1) 3)  Bacterial Vaginitis  (ICD-616.10) 4)  Need Proph Vacc&inoculat Agnst Oth Spec Disease  (ICD-V05.8) 5)  Hepatitis B Immunity  (ICD-V05.8) 6)  Ana Positive  (ICD-790.99) 7)  Urinary Tract Infection  (ICD-599.0) 8)  Pap Smear, Abnormal  (ICD-795.00) 9)  Vaginal Discharge  (ICD-623.5) 10)  External Hemorrhoids  (ICD-455.3) 11)  Obesity, Mild  (ICD-278.02) 12)  Diabetes Mellitus, Type II  (ICD-250.00) 13)  Proteinuria  (ICD-791.0) 14)  Neuropathy, 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 Take Two Tabs By Mouth Twice Daily 2)  Ambien 10 Mg  Tabs (Zolpidem Tartrate) .... Take 1 Tab By Mouth At Bedtime 3)  Synthroid 175 Mcg Tabs (Levothyroxine Sodium) .... Take 1 Tablet By Mouth Once A Day in The Morning  With An Empty Stomach. 4)  Famotidine 20 Mg Tabs (Famotidine) .... Take 1 Tablet By Mouth Two Times A Day 5)  Pen Needles 31g X 8 Mm Misc (Insulin Pen Needle) .... Use To Inject Insulin 4x Daily Before Meals and Bedtime 6)  Lantus Solostar 100 Unit/ml Soln (Insulin Glargine) .... Inject 30 Units Every Evening  ~ 9 Pm 7)  Metformin Hcl 1000 Mg Tabs (Metformin Hcl) .... Take 1 Tablet By Mouth Two Times A Day 8)  Glucotrol Xl 10 Mg Xr24h-Tab (Glipizide) .... Take 1 Tablet By Mouth Once A Day 9)  Wellbutrin Sr 150 Mg Xr12h-Tab (Bupropion Hcl) .... Take 1 Tablet By Mouth Two Times A Day 10)  Losartan Potassium 25 Mg Tabs (Losartan Potassium) .... Take 1 Tablet By Mouth Once A Day 11)  Tramadol Hcl 50 Mg Tabs (Tramadol Hcl) .... Take One Tab By Mouth Every 6 Hours As Needed Pain 12)  Lortab 10-500 Mg Tabs (Hydrocodone-Acetaminophen) .... Take 1 Tablet By Mouth Once A Day At Bedtime 13)  Novolog Flexpen 100 Unit/ml Soln (Insulin Aspart) .... Inject 3-4 Units 15 Minutes Prior To Eating Each Meal and At Bedtime As Directed 14)  Fluconazole 150 Mg Tabs (Fluconazole) .... Take One Tablet By Mouth Every Other Day X 3 15)  Metronidazole 500 Mg Tabs (Metronidazole) .... Take 1 Tablet By Mouth Two Times A Day For 7 Days 16)  Clotrimazole 1 % Crea (Clotrimazole) .... Apply To Rash and Intravaginall At Bedtime For 7 Days  Allergies (verified): 1)  ! 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 MULTIPLE SITES ARTHROPATHY -  L shoulder bursitis (subacromial/ subdeltoid) - MRI 05/16/06    - neg xray 2/00. Irreg contour acromion, otw nl- 4/05    - s/p steroid injection, 2/00    - s/p ortho referral  to [facility], Dr. 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  Risk Factors: Alcohol Use: 0 (05/22/2010) Exercise: yes (05/22/2010)  Risk Factors: Smoking Status: quit (05/22/2010)  Review of Systems  The patient denies anorexia, fever, weight loss, weight gain, vision loss, decreased hearing, hoarseness, chest pain, syncope, dyspnea on exertion, peripheral edema, prolonged cough, headaches, hemoptysis, abdominal pain, melena, hematochezia, severe indigestion/heartburn, hematuria, incontinence, muscle weakness, transient blindness, difficulty walking, depression, unusual weight change, abnormal bleeding, and enlarged lymph nodes.    Physical Exam  General:  Well-developed,well-nourished,in no acute distress; alert,appropriate and cooperative throughout examination; overweight-appearing.   Head:  no abnormalities observed.   Eyes:  vision grossly intact, corneas and lenses clear, and no injection.   Ears:  R ear  normal and L ear normal.   Nose:  no nasal discharge.   Mouth:  pharynx pink and moist, no erythema, no exudates, no posterior lymphoid hypertrophy, no lesions, and no erosions.   Neck:  No deformities, masses, or tenderness noted. Lungs:  Normal respiratory effort, chest expands symmetrically. Lungs are clear to auscultation, no crackles or wheezes. Heart:  Normal rate and regular rhythm. S1 and S2 normal without gallop, murmur, click, rub or other extra sounds. Abdomen:  Bowel sounds positive,abdomen soft and non-tender without masses, organomegaly or hernias noted. Rectal:  normal sphincter tone and external hemorrhoid(s).   Genitalia:  Right labia majora at 11 o'clock position a 5 cm in diameter abscess, erythematous, edematous and very TTP; no discharge. Also questionable left labia majora bartholin cyst infected. Vaginal canal with a very smll amount of clear discharge; moist and with rugae; no CMT; no adnexal massess palpated. Wet prep:+ "clue cells," no pseudohyphae. Msk:  no joint tenderness, no joint swelling, no joint warmth, no redness over joints, no joint instability, and no crepitation.   Pulses:  R and L carotid,radial,femoral,dorsalis pedis and posterior tibial pulses are full and equal bilaterally Extremities:  No clubbing, cyanosis, edema, or deformity noted with normal full range of motion of all joints.   Neurologic:  alert & oriented X3, strength normal in all extremities, and gait normal.   Skin:  Intact without suspicious lesions or rashes Cervical Nodes:  No lymphadenopathy noted Inguinal Nodes:  No significant adenopathy Psych:  Cognition and judgment appear intact. Alert and cooperative with normal attention span and concentration. No apparent delusions, illusions, hallucinations   Impression & Recommendations:  Problem # 1:  BACTERIAL VAGINITIS (ICD-616.10) per Wet prep, decreasednumber of clue cells; Whiff test +. Her updated medication list for this problem  includes:    Clindamycin Hcl 150 Mg Caps (Clindamycin hcl) .Marland Kitchen... Take 1 tablet by mouth two times a day for 10 days with meals  Discussed symptomatic relief and treatment options.   Problem # 2:  VULVAR ABSCESS (ICD-616.4)  Right labia majora abscess ->5cm in length and 1 cm deep; TTP; erythematous,edematous, no discharge. Refer to GYN for I&D. In meantime, will Rx clindamycin by mouth; pain managment; apply heat compresses to the area as needed.  Orders: Gynecologic Referral (Gyn)  Problem # 3:  DIABETES MELLITUS, TYPE II (ICD-250.00) Medications, diet, exercise, foot care reviewed with the patient. Will follow up in 3-4 months. Her updated medication list for this problem includes:  Lantus Solostar 100 Unit/ml Soln (Insulin glargine) ..... Inject 30 units every evening  ~ 9 pm    Metformin Hcl 1000 Mg Tabs (Metformin hcl) .Marland Kitchen... Take 1 tablet by mouth two times a day    Glucotrol Xl 10 Mg Xr24h-tab (Glipizide) .Marland Kitchen... Take 1 tablet by mouth once a day    Losartan Potassium 25 Mg Tabs (Losartan potassium) .Marland Kitchen... Take 1 tablet by mouth once a day    Novolog Flexpen 100 Unit/ml Soln (Insulin aspart) ..... Inject 3-4 units 15 minutes prior to eating each meal and at bedtime as directed  Orders: Capillary Blood Glucose/CBG [id number])  Labs Reviewed: Creat: 0.80 (04/23/2010)     Last Eye Exam: Results: Normal. Location:[facility] Dr. Mayford Knife (11/19/2007) Reviewed HgBA1c results: 7.0 (04/23/2010)  6.5 (02/08/2010)  Medications Added to Medication List This Visit: 1)  Metronidazole 500 Mg Tabs (Metronidazole) .... Take 1 tablet by mouth two times a day for 7 days 2)  Clindamycin Hcl 150 Mg Caps (Clindamycin hcl) .... Take 1 tablet by mouth two times a day for 10 days with meals 3)  Lidocaine-hydrocortisone Ace 3-0.5 % Crea (Lidocaine-hydrocortisone ace) .... Apply to vaginal labia q 6 hrs as needed for itching 4)  Fluconazole 150 Mg Tabs (Fluconazole) .... Take 1 tablet by  mouth once a dayx 1  Complete Medication List: 1)  Neurontin 600 Mg Tabs (Gabapentin) .... Take two tabs by mouth twice daily 2)  Ambien 10 Mg Tabs (Zolpidem tartrate) .... Take 1 tab by mouth at bedtime 3)  Synthroid 175 Mcg Tabs (Levothyroxine sodium) .... Take 1 tablet by mouth once a day in the morning with an empty stomach. 4)  Famotidine 20 Mg Tabs (Famotidine) .... Take 1 tablet by mouth two times a day 5)  Pen Needles 31g X 8 Mm Misc (Insulin pen needle) .... Use to inject insulin 4x daily before meals and bedtime 6)  Lantus Solostar 100 Unit/ml Soln (Insulin glargine) .... Inject 30 units every evening  ~ 9 pm 7)  Metformin Hcl 1000 Mg Tabs (Metformin hcl) .... Take 1 tablet by mouth two times a day 8)  Glucotrol Xl 10 Mg Xr24h-tab (Glipizide) .... Take 1 tablet by mouth once a day 9)  Wellbutrin Sr 150 Mg Xr12h-tab (Bupropion hcl) .... Take 1 tablet by mouth two times a day 10)  Losartan Potassium 25 Mg Tabs (Losartan potassium) .... Take 1 tablet by mouth once a day 11)  Tramadol Hcl 50 Mg Tabs (Tramadol hcl) .... Take one tab by mouth every 6 hours as needed pain 12)  Lortab 10-500 Mg Tabs (Hydrocodone-acetaminophen) .... Take 1 tablet by mouth once a day at bedtime 13)  Novolog Flexpen 100 Unit/ml Soln (Insulin aspart) .... Inject 3-4 units 15 minutes prior to eating each meal and at bedtime as directed 14)  Clotrimazole 1 % Crea (Clotrimazole) .... Apply to rash and intravaginall at bedtime for 7 days 15)  Clindamycin Hcl 150 Mg Caps (Clindamycin hcl) .... Take 1 tablet by mouth two times a day for 10 days with meals 16)  Lidocaine-hydrocortisone Ace 3-0.5 % Crea (Lidocaine-hydrocortisone ace) .... Apply to vaginal labia q 6 hrs as needed for itching 17)  Fluconazole 150 Mg Tabs (Fluconazole) .... Take 1 tablet by mouth once a dayx 1  Patient Instructions: 1)  Please, pick up your prescriptions from the pharmacy. 2)  Apply cream to vaginal labia every 8 hours as needed. 3)   Refill Fluconazole only if needed. 4)  Please, follow up  with your gynecologist. 5)  Call with any questions. 6)  Please, follow up with [place] on as needed basis; otherwise, in 4 months. Prescriptions: FLUCONAZOLE 150 MG TABS (FLUCONAZOLE) Take 1 tablet by mouth once a dayx 1  #1 x 0   Entered and Authorized by:   Milana Obey MD   Signed by:   Milana Obey MD on 05/22/2010   Method used:   Electronically to        [facility] [phone]* (retail)       [address] [place], [place]  [address]       Ph: [phone] or [phone]       Fax: [phone]   RxID:   [id number] METRONIDAZOLE 500 MG TABS (METRONIDAZOLE) Take 1 tablet by mouth two times a day for 7 days  #14 x 0   Entered and Authorized by:   Milana Obey MD   Signed by:   Milana Obey MD on 05/22/2010   Method used:   Electronically to        [facility] [phone]* (retail)       [address] [place], [place]  [address]       Ph: [phone] or [phone]       Fax: [phone]   RxID:   [id number] LIDOCAINE-HYDROCORTISONE ACE 3-0.5 % CREA (LIDOCAINE-HYDROCORTISONE ACE) apply to vaginal labia q 6 hrs as needed for itching  #45 gm x 0   Entered and Authorized by:   Milana Obey MD   Signed by:   Milana Obey MD on 05/22/2010   Method used:   Electronically to        [facility] [phone]* (retail)       [place], [place]  [address]       Ph: [phone] or [phone]       Fax: [phone]   RxID:   [id number] CLINDAMYCIN HCL 150 MG CAPS (CLINDAMYCIN HCL) Take 1 tablet by mouth two times a day for 10 days with meals  #20 x 0   Entered and Authorized by:   Milana Obey MD   Signed by:   Milana Obey MD on 05/22/2010   Method used:   Electronically to        [facility] [phone]* (retail)       [place],    [address]       Ph: [phone] or [phone]       Fax: [phone]   RxID:   [id number]   Prevention & Chronic Care Immunizations   Influenza vaccine: refuses  (07/07/2008)   Influenza vaccine due: 05/31/2010    Tetanus booster: 05/07/2010: Tdap   Tetanus booster due: 05/07/2020    Pneumococcal vaccine: Not documented  Other Screening   Pap smear: **GLANDULAR CELL ABNORMALITY: Atypical glandular cells, see  (06/21/2009)   Pap smear action/deferral: Ordered  (06/21/2009)    Mammogram: ASSESSMENT: Negative - BI-RADS 1^MM DIGITAL SCREENING  (05/16/2010)   Mammogram action/deferral: Ordered  (05/07/2010)   Mammogram due: 03/31/2009   Smoking status: quit  (05/22/2010)  Diabetes Mellitus   HgbA1C: 7.0  (04/23/2010)  Hemoglobin A1C due: 11/07/2010    Eye exam: Results: Normal. Location:[facility] Dr. Mayford Knife  (11/19/2007)   Diabetic eye exam action/deferral: Ophthalmology referral  (05/07/2010)   Eye exam due: 11/2008    Foot exam: yes  (05/07/2010)   High risk foot: No  (06/13/2008)   Foot care education: Not documented   Foot exam due: 08/07/2010    Urine microalbumin/creatinine ratio: 135.4  (11/13/2009)   Urine microalbumin/cr due: 11/13/2010  Lipids   Total Cholesterol: 174  (11/13/2009)   LDL: 113  (11/13/2009)   LDL Direct: Not documented   HDL: 46  (11/13/2009)   Triglycerides: 73  (11/13/2009)   Lipid panel due: 11/07/2010    SGOT (AST): 15  (04/23/2010)   SGPT (ALT): 11  (04/23/2010)   Alkaline phosphatase: 99  (04/23/2010)   Total bilirubin: 1.2  (04/23/2010)   Liver panel due: 11/07/2010  Self-Management Support :   Personal Goals (by the next clinic visit) :     Personal A1C goal: 7  (02/08/2010)     Personal blood pressure goal: 130/80  (02/08/2010)     Personal LDL goal: 100  (02/08/2010)    Patient will work on the following items until the next clinic visit to reach self-care goals:     Medications and monitoring: take  my medicines every day, bring all of my medications to every visit  (05/22/2010)     Eating: eat more vegetables, use fresh or frozen vegetables, eat baked foods instead of fried foods  (05/22/2010)     Activity: take a 30 minute walk every day  (05/22/2010)    Diabetes self-management support: Written self-care plan  (05/22/2010)   Diabetes care plan printed   Last medical nutrition therapy: 07/07/2008    Lipid self-management support: , Written self-care plan  (05/22/2010)   Lipid self-care plan printed.

## 2010-10-30 NOTE — Progress Notes (Signed)
Summary: prior authorization-Cozaar/gp  Phone Note Outgoing Call   Summary of Call: Prior Authorization for Cozaar 25mg  (Losartan Potassium) has been approved x 1 yr; from 03/20/10 to 03/20/11.  [place] made awared.  Initial call taken by: Morrison Old RN,  March 20, 2010 3:32 PM

## 2010-10-30 NOTE — Progress Notes (Signed)
Summary: refill/gg  Phone Note Refill Request  on October 19, 2009 3:36 PM  Refills Requested: Medication #1:  METFORMIN HCL 500 MG TABS Take 1 tablet by mouth two times a day   Last Refilled: 09/19/2009  Medication #2:  GLIPIZIDE 5 MG TABS Take 1 tablet by mouth once a day.   Last Refilled: 09/19/2009  Method Requested: Electronic Initial call taken by: Gevena Cotton RN,  October 19, 2009 3:36 PM  Follow-up for Phone Call        Refill approved-nurse to complete    Prescriptions: GLIPIZIDE 5 MG TABS (GLIPIZIDE) Take 1 tablet by mouth once a day  #30 x 3   Entered and Authorized by:   Rhea Pink  DO   Signed by:   Rhea Pink  DO on 10/19/2009   Method used:   Electronically to        [facility] [phone]* (retail)       [place], [place]  [address]       Ph: [phone] or [phone]       Fax: [phone]   RxID:   [id number] METFORMIN HCL 500 MG TABS (METFORMIN HCL) Take 1 tablet by mouth two times a day  #60 x 3   Entered and Authorized by:   Rhea Pink  DO   Signed by:   Rhea Pink  DO on 10/19/2009   Method used:   Electronically to        [facility] [phone]* (retail)       [place], [place]  [address]       Ph: [phone] or [phone]       Fax: [phone]   RxID:   [id number]

## 2010-10-30 NOTE — Progress Notes (Signed)
Summary: RTC  Phone Note Call from Patient Message from:  Patient  Caller: Patient Call For: Rhea Pink  DO Summary of Call: Pt called said that Dr. Hilma Favors had called her and left a message for her to call the [facility]. Sander Nephew RN  Feb 09, 2010 4:44 PM   Follow-up for Phone Call        Please tell Sistine that [facility] EVERYDAY no exceptions- she essentially has no thyroid function based on teh labs I have gotten and will need to medicine daily- no wonder she feels so bad!!! Follow-up by: Rhea Pink  DO,  Feb 13, 2010 2:36 PM  Additional Follow-up for Phone Call Additional follow up Details #1::        Patient has returned phone call several times.  She states she has not spoken with anyone yet about her labs.  She states that she is very concerned that she has not heard from anyone and she also has some questions.   She can be reached at the number listed [phone]. Additional Follow-up by: Enedina Finner,  Feb 21, 2010 10:47 AM  New Problems: ANA POSITIVE (ICD-790.99)   Additional Follow-up for Phone Call Additional follow up Details #2::    I spoke with Issabelle at length this afternoon about her abnormal lab findings.Please have her schedule in July and call her to come in for more labs(enerted) in next few days. Follow-up by: Rhea Pink  DO,  March 01, 2010 2:47 PM  Additional Follow-up for Phone Call Additional follow up Details #3:: Details for Additional Follow-up Action Taken: Call to pt to schedule a lab appointment as soon as possible.  Message left for pt to call the Clinics ad to ask for ladys  Called patient and left follow up appointment per dr Hilma Favors for July 04/05/2010 at 10:00am on patient answering machine. Additional Follow-up by: Sander Nephew RN,  March 01, 2010 4:22 PM  New Problems: ANA POSITIVE (ICD-790.99)

## 2010-10-30 NOTE — Progress Notes (Signed)
  Phone Note Outgoing Call   Action Taken: Phone Call Completed Reason for Call: Confirm/change Appt Details for Reason: Diabetes F/u Details of Request: Sch/Appt Summary of Call: Called pt to sch and appt with her PCP Dr. Hilma Favors for her Diabetes Check up.  Pt did sch her appt for 11/10/2008. Initial call taken by: Enedina Finner,  October 21, 2008 11:32 AM

## 2010-10-30 NOTE — Miscellaneous (Signed)
  Clinical Lists Changes  Orders: Added new Test order of T- GC Chlamydia [id number]) - Signed Added new Test order of T-Wet Prep by [facility] [phone]) - Signed

## 2010-10-30 NOTE — Progress Notes (Signed)
Summary: Refill/gh  Phone Note Refill Request Message from:  Fax from Pharmacy on March 07, 2009 11:06 AM  Refills Requested: Medication #1:  AMBIEN 10 MG  TABS Take 1 tab by mouth at bedtime   Last Refilled: 0  Method Requested: Fax to [facility] Initial call taken by: Sander Nephew RN,  March 07, 2009 11:06 AM  Follow-up for Phone Call        Refill approved-nurse to complete Follow-up by: Rhea Pink  DO,  March 08, 2009 8:23 AM  Additional Follow-up for Phone Call Additional follow up Details #1::        Rx faxed to pharmacy Additional Follow-up by: Sander Nephew RN,  March 08, 2009 11:39 AM      Prescriptions: AMBIEN 10 MG  TABS (ZOLPIDEM TARTRATE) Take 1 tab by mouth at bedtime  #30 x 3   Entered and Authorized by:   Rhea Pink  DO   Signed by:   Rhea Pink  DO on 03/08/2009   Method used:   Telephoned to ...       [facility] [phone]* (retail)       [place], [place]  [address]       Ph: [phone] or [phone]       Fax: [phone]   RxID:   [id number]

## 2010-10-30 NOTE — Assessment & Plan Note (Signed)
Summary: checkup, needs med refills/ pcp-Ahlijah Raia/hla   Vital Signs:  Patient profile:   50 year old female Height:      63 inches (160.02 cm) Weight:      195.04 pounds (88.65 kg) BMI:     34.67 Temp:     99.1 degrees F (37.28 degrees C) oral Pulse rate:   108 / minute BP sitting:   125 / 69  (right arm)  Vitals Entered By: Sander Nephew RN (November 13, 2009 11:13 AM) CC: Depression Is Patient Diabetic? No Pain Assessment Patient in pain? yes     Location: fingers, toes Intensity: 4 Type: numbness Onset of pain  Intermittent Nutritional Status BMI of > 30 = obese  Have you ever been in a relationship where you felt threatened, hurt or afraid?No   Does patient need assistance? Functional Status Self care Ambulation Normal Comments Check up.     Primary Care Provider:  Rhea Pink  DO  CC:  Depression.  History of Present Illness: Prentice comes in today for routine follow-up. I have not seen her in quite sometime and she has not been managing her chronic disease well at all. She has not been taking any of her medications. In the past I have done an extensive work-up for a chronic cough which has resolved on its own. 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  Worsening problems with depression since her mother died in 01/20/2007, her brother died in Jan 19, 2009. Significant depression. Also complains of urinary symptoms- dysuria and ?hematuria.  Depression History:      The patient denies a depressed mood most of the day but notes a diminished interest in her usual daily activities.  Positive alarm features for depression include insomnia, fatigue (loss of energy), feelings of worthlessness (guilt), and impaired concentration (indecisiveness).        The patient denies that she feels like life is not worth living, denies that she wishes that she were dead, and denies that she has thought about ending her life.        Comments:  Out of work.  Preventive Screening-Counseling &  Management  Alcohol-Tobacco     Alcohol drinks/day: 0     Smoking Status: quit     Year Quit: 1990's  Current Medications (verified): 1)  Neurontin 600 Mg  Tabs (Gabapentin) .... Take One Tablet Twice Daily 2)  Trazodone Hcl 100 Mg Tabs (Trazodone Hcl) .... Take 1 Tab By Mouth At Bedtime 3)  Ambien 10 Mg  Tabs (Zolpidem Tartrate) .... Take 1 Tab By Mouth At Bedtime 4)  Synthroid 175 Mcg Tabs (Levothyroxine Sodium) .... Take 1 Tablet By Mouth Once A Day in The Morning With An Empty Stomach. 5)  Famotidine 20 Mg Tabs (Famotidine) .... Take 1 Tablet By Mouth Two Times A Day 6)  Pen Needles 31g X 8 Mm Misc (Insulin Pen Needle) .... Use To Inject Insulin 4x Daily Before Meals and Bedtime 7)  Lantus Solostar 100 Unit/ml Soln (Insulin Glargine) .... Inject 15 Units Every Evening  ~ 9 Pm 8)  Metformin Hcl 1000 Mg Tabs (Metformin Hcl) .... Take 1 Tablet By Mouth Two Times A Day 9)  Glucotrol Xl 10 Mg Xr24h-Tab (Glipizide) .... Take 1 Tablet By Mouth Once A Day 10)  Ambien 10 Mg Tabs (Zolpidem Tartrate) .... Take 1 Tablet By Mouth Once A Day At Bedtime 11)  Wellbutrin Sr 150 Mg Xr12h-Tab (Bupropion Hcl) .... Take 1 Tablet By Mouth Two Times A Day  Allergies (verified): 1)  ! Ace Inhibitors  Past History:  Past Medical History: Reviewed history from 09/08/2008 and no changes required. 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 MULTIPLE SITES ARTHROPATHY -  L shoulder bursitis (subacromial/ subdeltoid) - MRI 05/16/06    - neg xray 2/00. Irreg contour acromion, otw nl- 4/05    - s/p steroid injection, 2/00    - s/p ortho referral to [place], Dr. Shanna Cisco, 12/00 -  R shoulder pain 6/01 w/ neg RF, ANA, ACE nl, CRP nl    - MRI- supraspinatus/infraspinats tendinopathy- 05/16/06 - Neg R and lower  knee 2 view / bilateral knee pain RIGHT SHOULDER LIPOMA 8/07  ANEMIA-NOS  SICKLE-CELL TRAIT, per pt report testing in 70 or 80's ANEMIA, MICROCYTIC  CARPAL TUNNEL SYNDROME - no records AIRWAY OBSTRUCTION w/ BD response (see cough description below)  PROTEINURIA  COUGH, CHRONIC -  Neg CXR, 3/04 -  FEV65, FVC73, Ratio 69- Spiro 4/04  INSOMNIA - failed Ambien. Currently on Ambien CR trial APPENDECTOMY, HX OF  DEPRESSION, since 4/00 -  Elavil 4 wks 4/00- 5/00 then 8/00, pt stopped on own -  Paxil 5/00- 6/01, swithced to Zoloft 1/03 -  Currently off meds  Review of Systems  The patient denies anorexia, weight loss, vision loss, hoarseness, chest pain, syncope, peripheral edema, prolonged cough, headaches, hemoptysis, and severe indigestion/heartburn.    Physical Exam  General:  Well-developed,well-nourished,in no acute distress; alert,appropriate and cooperative throughout examination Lungs:  Normal respiratory effort, chest expands symmetrically. Lungs are clear to auscultation, no crackles or wheezes. Heart:  Normal rate and regular rhythm. S1 and S2 normal without gallop, murmur, click, rub or other extra sounds. Abdomen:  soft, non-tender, and normal bowel sounds.   Extremities:  No clubbing, cyanosis, edema, or deformity noted with normal full range of motion of all joints.    Diabetes Management Exam:    Foot Exam (with socks and/or shoes not present):       Sensory-Pinprick/Light touch:          Left medial foot (L-4): normal          Left dorsal foot (L-5): normal          Left lateral foot (S-1): normal       Sensory-Monofilament:          Left foot: normal       Inspection:          Left foot: normal       Nails:          Left foot: normal    Foot Exam by Podiatrist:       Date: 11/21/2009       Results: no diabetic findings       Done by: Nance Pew Exam:       Eye Exam not due   Impression & Recommendations:  Problem # 1:  DEPRESSION (ICD-311) Will  start her on trial of Wellbutrin today for depression. Will add on trazadone for sleep.  Her updated medication list for this problem includes:    Trazodone Hcl 100 Mg Tabs (Trazodone hcl) .Marland Kitchen... Take 1 tab by mouth at bedtime    Wellbutrin Sr 150 Mg Xr12h-tab (Bupropion hcl) .Marland Kitchen... Take 1 tablet by mouth two times a day  Problem # 2:  DIABETES MELLITUS, TYPE II (ICD-250.00) Better, but still poorly controlled with an A1C of 8.4. Will check her urine for protien today. Encouraged her to consistently use her medication.  Her updated medication list for this problem includes:    Lantus Solostar 100 Unit/ml Soln (Insulin glargine) ..... Inject 15 units every evening  ~ 9 pm    Metformin Hcl 1000 Mg Tabs (Metformin hcl) .Marland Kitchen... Take 1 tablet by mouth two times a day    Glucotrol Xl 10 Mg Xr24h-tab (Glipizide) .Marland Kitchen... Take 1 tablet by mouth once a day    Losartan Potassium 25 Mg Tabs (Losartan potassium) .Marland Kitchen... Take 1 tablet by mouth once a day  Orders: T- Capillary Blood Glucose [id number]) T-Hgb A1C (in-house) [id number]) T-Culture, Urine [id number]) T-Comprehensive Metabolic Panel ([id number]) T-Lipid Profile [id number]) T-CBC w/Diff [id number]) T-Urinalysis [id number]) T-Urine Microalbumin w/creat. ratio [phone])  Labs Reviewed: Creat: 0.55 (07/21/2008)     Last Eye Exam: Results: Normal. Location:[facility] Dr[facility] Mayford Knife (11/19/2007) Reviewed HgBA1c results: 8.4 (11/13/2009)  10.7 (06/21/2009)  Problem # 3:  URINARY TRACT INFECTION (ICD-599.0)  Will check UA today.Encouraged to push clear liquids, get enough rest, and take acetaminophen as needed. Will call her with results.  Problem # 4:  PROTEINURIA (ICD-791.0) will check Urine microalbumin today- will likely need to be on an ACE   Problem # 5:  HYPERLIPIDEMIA (ICD-272.4)  Will check lipids- has not been taking a statin.  Labs Reviewed: SGOT: 17 (12/16/2007)   SGPT: 15 (12/16/2007)   HDL:60  (07/07/2008), 56 (12/16/2007)  LDL:96 (07/07/2008), 118 (12/16/2007)  Chol:166 (07/07/2008), 185 (12/16/2007)  Trig:48 (07/07/2008), 57 (12/16/2007)  Complete Medication List: 1)  Neurontin 600 Mg Tabs (Gabapentin) .... Take one tablet twice daily 2)  Trazodone Hcl 100 Mg Tabs (Trazodone hcl) .... Take 1 tab by mouth at bedtime 3)  Ambien 10 Mg Tabs (Zolpidem tartrate) .... Take 1 tab by mouth at bedtime 4)  Synthroid 175 Mcg Tabs (Levothyroxine sodium) .... Take 1 tablet by mouth once a day in the morning with an empty stomach. 5)  Famotidine 20 Mg Tabs (Famotidine) .... Take 1 tablet by mouth two times a day 6)  Pen Needles 31g X 8 Mm Misc (Insulin pen needle) .... Use to inject insulin 4x daily before meals and bedtime 7)  Lantus Solostar 100 Unit/ml Soln (Insulin glargine) .... Inject 15 units every evening  ~ 9 pm 8)  Metformin Hcl 1000 Mg Tabs (Metformin hcl) .... Take 1 tablet by mouth two times a day 9)  Glucotrol Xl 10 Mg Xr24h-tab (Glipizide) .... Take 1 tablet by mouth once a day 10)  Ambien 10 Mg Tabs (Zolpidem tartrate) .... Take 1 tablet by mouth once a day at bedtime 11)  Wellbutrin Sr 150 Mg Xr12h-tab (Bupropion hcl) .... Take 1 tablet by mouth two times a day 12)  Losartan Potassium 25 Mg Tabs (Losartan potassium) .... Take 1 tablet by mouth once a day    Patient Instructions: 1)  Please schedule a follow-up appointment in 1 month. Prescriptions: AMBIEN 10 MG  TABS (ZOLPIDEM TARTRATE) Take 1 tab by mouth at bedtime  #30 x 3   Entered and Authorized by:   Rhea Pink  DO   Signed by:   Rhea Pink  DO on 11/13/2009   Method used:   Print then Give to Patient   RxID:   [id number] WELLBUTRIN SR 150 MG XR12H-TAB (BUPROPION HCL) Take 1 tablet by mouth two times a day  #60  x 1   Entered and Authorized by:   Rhea Pink  DO   Signed by:   Rhea Pink  DO on 11/13/2009   Method used:   Print then Give to Patient   RxID:   [id number] GLUCOTROL XL 10 MG XR24H-TAB  (GLIPIZIDE) Take 1 tablet by mouth once a day  #31 x 3   Entered and Authorized by:   Rhea Pink  DO   Signed by:   Rhea Pink  DO on 11/13/2009   Method used:   Print then Give to Patient   RxID:   [id number] METFORMIN HCL 1000 MG TABS (METFORMIN HCL) Take 1 tablet by mouth two times a day  #60 x 3   Entered and Authorized by:   Rhea Pink  DO   Signed by:   Rhea Pink  DO on 11/13/2009   Method used:   Print then Give to Patient   RxID:   [id number] TRAZODONE HCL 100 MG TABS (TRAZODONE HCL) Take 1 tab by mouth at bedtime  #30 x 3   Entered and Authorized by:   Rhea Pink  DO   Signed by:   Rhea Pink  DO on 11/13/2009   Method used:   Print then Give to Patient   RxID:   [id number]   Prevention & Chronic Care Immunizations   Influenza vaccine: refuses  (07/07/2008)    Tetanus booster: Not documented    Pneumococcal vaccine: Not documented  Other Screening   Pap smear: **GLANDULAR CELL ABNORMALITY: Atypical glandular cells, see  (06/21/2009)   Pap smear action/deferral: Ordered  (06/21/2009)    Mammogram:  Assessment: BIRADS 1. Location: [place].     (04/01/2007)   Mammogram action/deferral: Screening mammogram in 1 year.     (04/01/2007)   Mammogram due: 03/2008   Smoking status: quit  (11/13/2009)  Diabetes Mellitus   HgbA1C: 8.4  (11/13/2009)    Eye exam: Results: Normal. Location:[facility] Dr. Mayford Knife  (11/19/2007)   Eye exam due: 11/2008    Foot exam: yes  (11/13/2009)   High risk foot: No  (06/13/2008)   Foot care education: Not documented    Urine microalbumin/creatinine ratio: 129.9  (06/13/2008)  Lipids   Total Cholesterol: 166  (07/07/2008)   LDL: 96  (07/07/2008)   LDL Direct: Not documented   HDL: 60  (07/07/2008)   Triglycerides: 48  (07/07/2008)    SGOT (AST): 17  (12/16/2007)   SGPT (ALT): 15  (12/16/2007) CMP ordered    Alkaline phosphatase: 99  (12/16/2007)    Total bilirubin: 1.0  (12/16/2007)  Self-Management Support :    Patient will work on the following items until the next clinic visit to reach self-care goals:     Medications and monitoring: take my medicines every day, check my blood sugar, bring all of my medications to every visit, examine my feet every day  (11/13/2009)     Eating: drink diet soda or water instead of juice or soda, eat more vegetables, eat foods that are low in salt, eat baked foods instead of fried foods  (11/13/2009)     Activity: take a 30 minute walk every day  (11/13/2009)    Diabetes self-management support: Not documented   Last medical nutrition therapy: 07/07/2008    Lipid self-management support: Not documented   Process Orders Check Orders Results:     [facility]: ABN not required for this insurance Tests Sent for requisitioning (November 21, 2009 4:19  PM):     11/13/2009: [facility] -- T-Culture, Urine [id number] (signed)     11/13/2009: [facility] -- T-Comprehensive Metabolic Panel [id number] (signed)     11/13/2009: [facility] -- T-Lipid Profile [phone] (signed)     11/13/2009: [facility] -- T-CBC w/Diff [id number] (signed)     11/13/2009: [facility] -- T-Urinalysis [id number] (signed)     11/13/2009: [facility] -- T-Urine Microalbumin w/creat. ratio [[id number]] (signed)    Laboratory Results   Blood Tests   Date/Time Received: November 13, 2009 11:57 AM Date/Time Reported: Maryan Rued  November 13, 2009 11:57 AM  HGBA1C: 8.4%   (Normal Range: Non-Diabetic - 3-6%   Control Diabetic - 6-8%) CBG Fasting:: 222mg /dL

## 2010-10-30 NOTE — Assessment & Plan Note (Signed)
Summary: ACUTE-VAGINAL DISCHARGE AND HEMMORRHOIDS/(GOLDING)/CFB   Vital Signs:  Patient profile:   50 year old female Height:      63 inches (160.02 cm) Weight:      186.5 pounds (89.86 kg) BMI:     35.15 Temp:     98.8 degrees F (37.11 degrees C) oral Pulse rate:   109 / minute BP sitting:   144 / 71  (right arm) Cuff size:   regular  Vitals Entered By: Lucky Rathke NT II (June 21, 2009 9:25 AM) CC: HEMMORRHOIDS /  ? YEAST INFECTION     /  UNABLE TO AFFORD MEDICATIONS  /  PATIENT TO SEE [facility]  / REFUSE FLU SHOT Is Patient Diabetic? Yes  Pain Assessment Patient in pain? yes     Location: butt/throat/ ear Intensity:     8.5 Type: sharp Onset of pain  last week Nutritional Status BMI of > 30 = obese CBG Result 307  Have you ever been in a relationship where you felt threatened, hurt or afraid?No   Does patient need assistance? Functional Status Self care Ambulation Normal Comments HENMMORRHOIDS   / ? YEAST INFECTION    / UNABLE TO AFFORD MEDICATIONS  / PATIENT TO SEE [facility]  /  REFRUSE FLU SHOT   CC:  HEMMORRHOIDS /  ? YEAST INFECTION     /  UNABLE TO AFFORD MEDICATIONS  /  PATIENT TO SEE [facility]  / REFUSE FLU SHOT.  History of Present Illness: 50 yr old woman with pmhx as described below comes to the clinic complaining of vaginal discharge, itching, and pain on defecation since 2 weeks. No new partners. Associated with diarrhea, 3-4 times a day for a week resolved on sunday. Patient states to have had a fever of 100 two weeks ago. Fever was only experienced for one day. Denies chills, abdominal pain, n/v. Last time patient had sexual intercourse was 4 months ago.  Last menstrual cycle was the end of August. Patient is regular. Period last for 5-7 days.   Patient can not afford humulin or lantus.   Patient always has diarrhea the week before her period.  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 Take One Tablet Twice Daily 2)  Amitriptyline Hcl 25 Mg  Tabs (Amitriptyline Hcl) .... Take Three  Tablets At Bedtime 3)  Ambien 10 Mg  Tabs (Zolpidem Tartrate) .... Take 1 Tab By Mouth At Bedtime 4)  Synthroid 175 Mcg Tabs (Levothyroxine Sodium) .... Take 1 Tablet By Mouth Once A Day in The Morning  With An Empty Stomach. 5)  Albuterol 90 Mcg/act  Aers (Albuterol) .... One Puff Every 4 Hours As Needed 6)  Tussionex Pennkinetic Er 8-10 Mg/22ml Lqcr (Chlorpheniramine-Hydrocodone) .Marland Kitchen.. 1 Tsp Every 12 Hours 7)  Simply Saline 0.9 % Aers (Saline) .... Use Daily As Directed and As Needed 8)  Famotidine 20 Mg Tabs (Famotidine) .... Take 1 Tablet By Mouth Two Times A Day 9)  Pen Needles 31g X 8 Mm Misc (Insulin Pen Needle) .... Use To Inject Insulin 4x Daily Before Meals and Bedtime 10)  Humalog Kwikpen 100 Unit/ml Soln (Insulin Lispro (Human)) .... Inject 15 Units Before Breakfast, Lunch and Dinner. Keep Carbohydrate Intake Between 50-60 Grams Each Meal. 11)  Lantus Solostar 100 Unit/ml Soln (Insulin Glargine) .... Inject 50 Units Every Evening  ~ 9 Pm  Current Medications (verified): 1)   Neurontin 600 Mg  Tabs (Gabapentin) .... Take One Tablet Twice Daily 2)  Amitriptyline Hcl 25 Mg  Tabs (Amitriptyline Hcl) .... Take Three  Tablets At Bedtime 3)  Ambien 10 Mg  Tabs (Zolpidem Tartrate) .... Take 1 Tab By Mouth At Bedtime 4)  Synthroid 175 Mcg Tabs (Levothyroxine Sodium) .... Take 1 Tablet By Mouth Once A Day in The Morning With An Empty Stomach. 5)  Albuterol 90 Mcg/act  Aers (Albuterol) .... One Puff Every 4 Hours As Needed 6)  Simply Saline 0.9 % Aers (Saline) .... Use Daily As Directed and As Needed 7)  Famotidine 20 Mg Tabs (Famotidine) .... Take 1 Tablet By Mouth Two Times A Day 8)  Pen Needles 31g X 8 Mm Misc (Insulin Pen Needle) .... Use To Inject Insulin 4x Daily Before Meals and Bedtime 9)  Humalog Kwikpen 100 Unit/ml Soln (Insulin Lispro (Human)) .... Inject 15 Units Before Breakfast, Lunch and Dinner. Keep Carbohydrate Intake Between 50-60 Grams Each Meal. 10)  Lantus Solostar 100 Unit/ml Soln (Insulin Glargine) .... Inject 50 Units Every Evening  ~ 9 Pm  Allergies: 1)  ! 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 MULTIPLE SITES ARTHROPATHY -  L shoulder bursitis (subacromial/ subdeltoid) - MRI 05/16/06    - neg xray 2/00. Irreg contour acromion, otw nl- 4/05    - s/p steroid injection, 2/00    - s/p ortho referral to [facility], Dr. Shanna Cisco, 12/00 -  R shoulder pain 6/01 w/ neg RF, ANA, ACE nl, CRP nl    - MRI- supraspinatus/infraspinats tendinopathy- 05/16/06 - Neg R and lower knee 2 view / bilateral knee pain RIGHT SHOULDER LIPOMA 8/07  ANEMIA-NOS  SICKLE-CELL TRAIT, per pt report testing in 70 or 80's ANEMIA, MICROCYTIC  CARPAL TUNNEL SYNDROME - no records AIRWAY OBSTRUCTION  w/ BD response (see cough description below)  PROTEINURIA  COUGH, CHRONIC -  Neg CXR, 3/04 -  FEV65, FVC73, Ratio 69- Spiro 4/04  INSOMNIA - failed Ambien. 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  Risk Factors: Alcohol Use: 0 (06/21/2009) Exercise: yes (06/21/2009)  Risk Factors: Smoking Status: quit (06/21/2009)  Family History: Reviewed history from 01/17/2009 and no changes required. Cancer on father side, unknown Mother: HTN, thyroid problem  Social History: Reviewed history from 07/07/2008 and no changes required. 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  Review of Systems       The patient complains of headaches.  The patient denies fever, chest pain, dyspnea on exertion, hemoptysis, abdominal pain, melena, hematochezia, hematuria, muscle weakness, difficulty walking, and unusual weight change.    Physical Exam  General:  NAD Mouth:  MMM Neck:  supple.   Lungs:  normal respiratory effort, no intercostal retractions, no accessory muscle use, normal breath sounds, no crackles, and no wheezes.   Heart:  normal rate, regular rhythm, no murmur, and no JVD.   Abdomen:  soft, non-tender, and normal bowel sounds.   Rectal:  external hemorrhoid(s).   Genitalia:  vaginal discharge.  blood from cervical os, skin tear between labia majora and minora Msk:  normal ROM, no joint tenderness, no joint swelling, no joint warmth, and no redness over joints.    Extremities:  no edema or cyanosis Neurologic:  Nonfocal Psych:  normally interactive.     Impression & Recommendations:  Problem # 1:  VAGINAL DISCHARGE (ICD-623.5)  Exam suggestive of  Bacterial Vaginosis. Will start treatment with Flagyl 500mg  by mouth two times a day for 7 days. Will review labs and reasses. Patient will return to clinic in two weeks. Patient also have a skin tear between labia majora and minora. On follow up with also order HSV and HIV test.  Orders: T-Culture, Urine [id number]) T-Urinalysis Dipstick only [id number]) T-Chlamydia & GC Probe, Genital ([id number]) T-Urine Pregnancy (in -house) [id number]) T-Wet Prep by Molecular Probe [phone])  Problem # 2:  EXTERNAL HEMORRHOIDS (ICD-455.3) On exam external hemorrhoids where noted but also patient complains of pain on defecation. Patient will be started on stool softeners, told to do sitz baths, and use preparation H. Will reasses in two weeks.  Problem # 3:  DIABETES MELLITUS, TYPE II (ICD-250.00) Patient was instructed to call the MAP program to get medications. She was also told to check sugars at least two times a day and  bring meter with her during the next visit. Will reasses in two weeks.  Her updated medication list for this problem includes:    Humalog Kwikpen 100 Unit/ml Soln (Insulin lispro (human)) ..... Inject 15 units before breakfast, lunch and dinner. keep carbohydrate intake between 50-60 grams each meal.    Lantus Solostar 100 Unit/ml Soln (Insulin glargine) ..... Inject 50 units every evening  ~ 9 pm  Orders: T- Capillary Blood Glucose [id number]) T-Hgb A1C (in-house) [id number])  Labs Reviewed: Creat: 0.55 (07/21/2008)     Last Eye Exam: Results: Normal. Location:[facility] Kn[facility] (11/19/2007) Reviewed HgBA1c results: 10.7 (06/21/2009)  9.2 (09/08/2008)  Problem # 4:  HYPERLIPIDEMIA (ICD-272.4) Will check FLP and cmet during the next visit.  Labs Reviewed:  SGOT: 17 (12/16/2007)   SGPT: 15 (12/16/2007)   HDL:60 (07/07/2008), 56 (12/16/2007)  LDL:96 (07/07/2008), 118 (12/16/2007)  Chol:166 (07/07/2008), 185 (12/16/2007)  Trig:48 (07/07/2008), 57 (12/16/2007)  Problem # 5:  HYPOTHYROIDISM (ICD-244.9) Will recheck TSH during next visit and reasses.  Her updated medication list for this problem includes:  Synthroid 175 Mcg Tabs (Levothyroxine sodium) .Marland Kitchen... Take 1 tablet by mouth once a day in the morning with an empty stomach.  Labs Reviewed: TSH: 12.641 (06/13/2008)    HgBA1c: 10.7 (06/21/2009) Chol: 166 (07/07/2008)   HDL: 60 (07/07/2008)   LDL: 96 (07/07/2008)   TG: 48 (07/07/2008)  Problem # 6:  Preventive Health Care (ICD-V70.0) Will order mammogram during the next visit.  Complete Medication List: 1)  Neurontin 600 Mg Tabs (Gabapentin) .... Take one tablet twice daily 2)  Amitriptyline Hcl 25 Mg Tabs (Amitriptyline hcl) .... Take three  tablets at bedtime 3)  Ambien 10 Mg Tabs (Zolpidem tartrate) .... Take 1 tab by mouth at bedtime 4)  Synthroid 175 Mcg Tabs (Levothyroxine sodium) .... Take 1 tablet by mouth once a day in the morning with an empty stomach. 5)  Albuterol 90 Mcg/act Aers (Albuterol) .... One puff every 4 hours as needed 6)  Simply Saline 0.9 % Aers (Saline) .... Use daily as directed and as needed 7)  Famotidine 20 Mg Tabs (Famotidine) .... Take 1 tablet by mouth two times a day 8)  Pen Needles 31g X 8 Mm Misc (Insulin pen needle) .... Use to inject insulin 4x daily before meals and bedtime 9)  Humalog Kwikpen 100 Unit/ml Soln (Insulin lispro (human)) .... Inject 15 units before breakfast, lunch and dinner. keep carbohydrate intake between 50-60 grams each meal. 10)  Lantus Solostar 100 Unit/ml Soln (Insulin glargine) .... Inject 50 units every evening  ~ 9 pm 11)  Colace 100 Mg Caps (Docusate sodium) .... Take 1 tablet by mouth twice a day 12)  Flagyl 500 Mg Tabs (Metronidazole) .... Take 1 tablet by mouth twice a day  for 7 days  Other Orders: T-PAP [facility]) [phone])  Patient Instructions: 1)  Please schedule a follow-up appointment in 2 weeks. 2)  Take all medication as indicated. 3)  You will be called with any abnormalities in the tests scheduled or performed today.  If you don't hear from [place] within a week from when the test was performed, you can assume that your test was normal.  4)  Call MAP program for possible assistance with medication. 5)  Do warm sitz baths. Prescriptions: FLAGYL 500 MG TABS (METRONIDAZOLE) Take 1 tablet by mouth twice a day for 7 days  #14 x 0   Entered and Authorized by:   Rudie Meyer MD   Signed by:   Rudie Meyer MD on 06/22/2009   Method used:   Electronically to        [facility] [phone]* (retail)       [place], [place]  [address]       Ph: [phone] or [phone]       Fax: [phone]   RxID:   [id number] COLACE 100 MG CAPS (DOCUSATE SODIUM) Take 1 tablet by mouth twice a day  #60 x 3   Entered and Authorized by:   Rudie Meyer MD   Signed by:   Rudie Meyer MD on 06/21/2009   Method used:   Print then Give to Patient   RxID:   [id number]   Laboratory Results   Urine Tests  Date/Time Received: June 21, 2009 11:37 AM  Date/Time Reported: lela s.  June 21, 2009 11:38 AM   Routine Urinalysis   Color: yellow Appearance: Clear Glucose: >=1000   (Normal Range: Negative) Bilirubin: negative   (  Normal Range: Negative) Ketone: large (80)   (Normal Range: Negative) Spec. Gravity: 1.020   (Normal Range: 1.003-1.035) Blood: trace-lysed   (Normal Range: Negative) pH: 5.0   (Normal Range: 5.0-8.0) Protein: trace   (Normal Range: Negative) Urobilinogen: 0.2   (Normal Range: 0-1) Nitrite: positive   (Normal Range: Negative) Leukocyte Esterace: negative   (Normal Range: Negative)    Urine HCG: negative Comments: Urine Specific Gravity 1.026  Melvia Heaps  June 21, 2009 11:38 AM   Blood Tests   Date/Time Received: June 21, 2009 9:57 AM  Date/Time Reported: Melvia Heaps  June 21, 2009 9:57 AM   HGBA1C: 10.7%   (Normal Range: Non-Diabetic - 3-6%   Control Diabetic - 6-8%) CBG Random:: 307mg /dL  Date/Time Received:  Date/Time Reported:     Process Orders Check Orders Results:     [facility]: [id number] not required for this insurance Tests Sent for requisitioning (June 22, 2009 4:50 PM):     06/21/2009: [facility] -- T-Culture, Urine [id number] (signed)     06/21/2009: [facility] -- T-Chlamydia & GC Probe, Genital [[id number]] (signed)     06/21/2009: [facility] -- T-Wet Prep by Molecular Probe [phone] (signed)    Prevention & Chronic Care Immunizations   Influenza vaccine: refuses  (07/07/2008)    Tetanus booster: Not documented    Pneumococcal vaccine: Not documented  Other Screening   Pap smear: Not documented   Pap smear action/deferral: Ordered  (06/21/2009)    Mammogram:  Assessment: BIRADS 1. Location: [facility].     (04/01/2007)   Mammogram action/deferral: Screening mammogram in 1 year.     (04/01/2007)   Mammogram due: 03/2008   Smoking status: quit  (06/21/2009)  Diabetes Mellitus   HgbA1C: 10.7  (06/21/2009)    Eye exam: Results: Normal. Location:[facility] Dr. Mayford Knife  (11/19/2007)   Eye exam due: 11/2008    Foot exam: yes  (01/17/2009)   High risk foot: No  (06/13/2008)   Foot care education: Not documented    Urine microalbumin/creatinine ratio: 129.9  (06/13/2008)  Lipids   Total Cholesterol: 166  (07/07/2008)   LDL: 96  (07/07/2008)   LDL Direct: Not documented   HDL: 60  (07/07/2008)   Triglycerides: 48  (07/07/2008)    SGOT (AST): 17  (12/16/2007)   SGPT (ALT): 15  (12/16/2007)   Alkaline phosphatase: 99  (12/16/2007)   Total  bilirubin: 1.0  (12/16/2007)  Self-Management Support :    Diabetes self-management support: Not documented   Last medical nutrition therapy: 07/07/2008    Lipid self-management support: Not documented    Nursing Instructions: Pap smear today

## 2010-10-30 NOTE — Progress Notes (Signed)
Summary: refill/ hla  Phone Note Refill Request Message from:  Fax from Pharmacy on December 25, 2009 11:16 AM  Refills Requested: Medication #1:  SYNTHROID 175 MCG TABS Take 1 tablet by mouth once a day In the morning with an empty stomach.   Last Refilled: 05/25/2009 Initial call taken by: Freddy Finner RN,  December 25, 2009 11:16 AM  Follow-up for Phone Call        Refill approved-nurse to complete Follow-up by: Rhea Pink  DO,  December 25, 2009 4:17 PM    Prescriptions: SYNTHROID 175 MCG TABS (LEVOTHYROXINE SODIUM) Take 1 tablet by mouth once a day In the morning with an empty stomach.  #31 x 11   Entered and Authorized by:   Rhea Pink  DO   Signed by:   Rhea Pink  DO on 12/25/2009   Method used:   Electronically to        [facility] [phone]* (retail)       [address] [place], [place]  [address]       Ph: [phone] or [phone]       Fax: [phone]   RxID:   [id number]

## 2010-10-30 NOTE — Progress Notes (Signed)
Summary: prior authorization-Zolpidem/gp  Phone Note Other Incoming   Summary of Call: Prior Authorization is required for Zolpidem 10mg ; [place] form needs to be completed and signed.  I will put in MD's box. Thanks Initial call taken by: Morrison Old RN,  March 20, 2010 3:37 PM  Follow-up for Phone Call        quantity limit exceeded. Will hold med and re-evaluate patient. Follow-up by: Rhea Pink  DO,  April 17, 2010 2:56 PM

## 2010-10-30 NOTE — Progress Notes (Signed)
  Phone Note Outgoing Call   Call placed by: Lucky Rathke NT II,  May 08, 2010 3:39 PM Call placed to: Patient Details for Reason: EYE REFERRAL Summary of Call: SPOKE WITH Kayla [name], SHE SAID SHE WILL MAKE HER OWN APPT . SHE WILL CALL [facility] WITH THE APPT. Woodward Klem NT II  May 08, 2010 3:40 PM

## 2010-10-30 NOTE — Progress Notes (Signed)
Summary: refill/gg     [facility]  Phone Note Refill Request  on September 09, 2007 12:59 PM  Refills Requested: Medication #1:  AMBIEN CR 12.5 MG  TBCR Take one tablet at bedtime   Last Refilled: 03/18/2007 Refill is for zolpidem 10 mg take 1/2 to 1 tab at hs  Initial call taken by: Gevena Cotton RN,  September 09, 2007 1:05 PM  Follow-up for Phone Call        Please contact pharmacy to find out which one she has been getting filled the regular AMBIEN or the AMBIEN CR.  Follow-up by: Luane School MD,  September 14, 2007 10:10 AM  Additional Follow-up for Phone Call Additional follow up Details #1::        Pt got  zolpidem 10 mg in June.  Then she just filled ambien 12.5mg  on 09/03/07 per pharmacy. So she does not need this refill. Additional Follow-up by: Gevena Cotton RN,  September 14, 2007 3:26 PM    Additional Follow-up for Phone Call Additional follow up Details #2::    Ok, we will deny new refill request.  Follow-up by: Luane School MD,  September 14, 2007 11:40 PM  Additional Follow-up for Phone Call Additional follow up Details #3:: Details for Additional Follow-up Action Taken: done Additional Follow-up by: Gevena Cotton RN,  September 15, 2007 9:22 AM    Prescriptions: AMBIEN CR 12.5 MG  TBCR (ZOLPIDEM TARTRATE) Take one tablet at bedtime  #30 x 3   Entered and Authorized by:   Luane School MD   Signed by:   Luane School MD on 09/14/2007   Method used:   Telephoned to ...         [id number]

## 2010-10-30 NOTE — Progress Notes (Signed)
Summary: med refill/gp  Phone Note Refill Request Message from:  Fax from Pharmacy on December 28, 2009 9:54 AM  Refills Requested: Medication #1:  NEURONTIN 600 MG  TABS Take one tablet twice daily   Last Refilled: 03/22/2009  Method Requested: Electronic Initial call taken by: Morrison Old RN,  December 28, 2009 9:54 AM  Follow-up for Phone Call        Refill approved-nurse to complete Follow-up by: Rhea Pink  DO,  December 28, 2009 1:39 PM    Prescriptions: NEURONTIN 600 MG  TABS (GABAPENTIN) Take one tablet twice daily  #90 x 0   Entered and Authorized by:   Rhea Pink  DO   Signed by:   Rhea Pink  DO on 12/28/2009   Method used:   Electronically to        [facility] [phone]* (retail)       [place], [place]  [address]       Ph: [phone] or [phone]       Fax: [phone]   RxID:   [id number]

## 2010-10-30 NOTE — Progress Notes (Signed)
Summary: PREVENTIVE COLONOSCOPY  Phone Note Outgoing Call   Summary of Call: Patient is still under the age of 25.  No information in regards to a colonoscopy ever being sch or performed in EMR or ECHART. Initial call taken by: Enedina Finner,  August 30, 2010 10:29 AM

## 2010-10-30 NOTE — Progress Notes (Signed)
Summary: med refill/gp  Phone Note Refill Request Message from:  Patient on March 26, 2010 1:50 PM  Refills Requested: Medication #1:  NEURONTIN 600 MG  TABS Take two tabs by mouth twice daily  Medication #2:  GLUCOTROL XL 10 MG XR24H-TAB Take 1 tablet by mouth once a day Also wants to be restarted on Humalog Kwikpen - she has medicaid now and states she can afford the kwipen now.   Method Requested: Electronic Initial call taken by: Morrison Old RN,  March 26, 2010 1:51 PM  Follow-up for Phone Call        Refill approved-nurse to complete Follow-up by: Rhea Pink  DO,  March 27, 2010 11:35 AM    New/Updated Medications: HUMALOG KWIKPEN 100 UNIT/ML SOLN (INSULIN LISPRO (HUMAN)) Inject 3-4 units with meals as directed Prescriptions: GLUCOTROL XL 10 MG XR24H-TAB (GLIPIZIDE) Take 1 tablet by mouth once a day  #31 x 3   Entered and Authorized by:   Rhea Pink  DO   Signed by:   Rhea Pink  DO on 03/27/2010   Method used:   Electronically to        [facility] [phone]* (retail)       [address] [place], [place]  [address]       Ph: [phone] or [phone]       Fax: [phone]   RxID:   [id number] NEURONTIN 600 MG  TABS (GABAPENTIN) Take two tabs by mouth twice daily  #120 x 5   Entered and Authorized by:   Rhea Pink  DO   Signed by:   Rhea Pink  DO on 03/27/2010   Method used:   Electronically to        [facility] [phone]* (retail)       [place], [place]  [address]       Ph: [phone] or [phone]       Fax: [phone]   RxID:   [id number] HUMALOG KWIKPEN 100 UNIT/ML SOLN (INSULIN LISPRO (HUMAN)) Inject 3-4 units with meals as directed  #1 box x 11   Entered and Authorized by:   Rhea Pink  DO   Signed by:   Rhea Pink  DO on 03/27/2010   Method used:   Electronically to        [facility] [phone]* (retail)       [place], [place]  [address]       Ph: [phone] or [phone]       Fax: [phone]   RxID:   [id number]

## 2010-10-30 NOTE — Progress Notes (Signed)
Summary: refill/gg  Phone Note Refill Request  on March 20, 2009 3:35 PM  Refills Requested: Medication #1:  NEURONTIN 600 MG  TABS Take one tablet twice daily   Last Refilled: 02/12/2009  Method Requested: Electronic Initial call taken by: Gevena Cotton RN,  March 20, 2009 3:37 PM  Follow-up for Phone Call        Prescription sent electronically to patient's pharmacy.  Follow-up by: Rico Sheehan DO,  March 22, 2009 3:50 PM      Prescriptions: NEURONTIN 600 MG  TABS (GABAPENTIN) Take one tablet twice daily  #90 x 0   Entered and Authorized by:   Rico Sheehan DO   Signed by:   Rico Sheehan DO on 03/22/2009   Method used:   Electronically to        [facility] [phone]* (retail)       [place], [place]  [address]       Ph: [phone] or [phone]       Fax: [phone]   RxID:   [id number]

## 2010-10-30 NOTE — Progress Notes (Signed)
Summary: phone/gg  *  Phone Note Call from Patient   Caller: Patient Summary of Call: Pt called and states the pills she was given for infection has not stopped the external itching and she is getting raw. Can we give her something else? Pt # [id number] Initial call taken by: Gevena Cotton RN,  May 14, 2010 12:57 PM  Follow-up for Phone Call        Rx Called In Follow-up by: Milana Obey MD,  May 14, 2010 4:59 PM    New/Updated Medications: CLOTRIMAZOLE 1 % CREA (CLOTRIMAZOLE) apply to rash and intravaginall at bedtime for 7 days Prescriptions: CLOTRIMAZOLE 1 % CREA (CLOTRIMAZOLE) apply to rash and intravaginall at bedtime for 7 days  #1 tub x 0   Entered and Authorized by:   Milana Obey MD   Signed by:   Milana Obey MD on 05/14/2010   Method used:   Electronically to        [facility] [phone]* (retail)       [address] [place], [place]  [address]       Ph: [phone] or [phone]       Fax: [phone]   RxID:   [id number]

## 2010-10-30 NOTE — Progress Notes (Signed)
Summary: Cough  Phone Note Call from Patient   Caller: Patient Call For: Rhea Pink  DO Summary of Call: Call frompt left message with Lela that she has a bad cough and wants to get something for it.  Would like the cough medication that she was prescribed before.   Pt uses the [facility] in [place]. Sander Nephew RN  August 21, 2010 1:53 PM  Initial call taken by: Sander Nephew RN,  August 21, 2010 1:53 PM  Follow-up for Phone Call        ok to call in cough medicine. If symptoms don not improve in 5-7 days she can get teh antibiotic filled. if she has fevers or shortness of breath she needs to come in for evaluation. Follow-up by: Rhea Pink  DO,  August 21, 2010 3:19 PM  Additional Follow-up for Phone Call Additional follow up Details #1::        scripts sent electronically Additional Follow-up by: Rhea Pink  DO,  August 22, 2010 10:45 AM    Additional Follow-up for Phone Call Additional follow up Details #2::    Rx called in and pt informed of instructions. Patient/caller verbalizes understanding of these instructions.  Follow-up by: Gevena Cotton RN,  August 22, 2010 11:52 AM  New/Updated Medications: [name] ER 10-8 MG/5ML LQCR (HYDROCOD POLST-CHLORPHEN POLST) Take 1 tsp by mouth at bedtime for cough Prescriptions: TUSSIONEX PENNKINETIC ER 10-8 MG/5ML LQCR (HYDROCOD POLST-CHLORPHEN POLST) Take 1 tsp by mouth at bedtime for cough  #150 x 0   Entered and Authorized by:   Rhea Pink  DO   Signed by:   Rhea Pink  DO on 08/21/2010   Method used:   Telephoned to ...       [facility] [phone]* (retail)       [place], [place]  [address]       Ph: [phone] or [phone]       Fax: [phone]   RxID:   [id number]

## 2010-10-30 NOTE — Assessment & Plan Note (Signed)
Summary: (ACUTE-GOLDING)2 WEEK F/U PER VEGA/CH   Vital Signs:  Patient profile:   50 year old female Height:      63 inches (160.02 cm) Weight:      190.3 pounds (84.77 kg) BMI:     33.16 Temp:     99.0 degrees F (37.22 degrees C) oral Pulse rate:   104 / minute BP sitting:   118 / 75  (right arm) Cuff size:   large  Vitals Entered By: Lucky Rathke NT II (July 05, 2009 11:39 AM) CC: FOLLOW UP APPT  / HAS APPT FOR THE GYN CLINIC  / STILL HAVE VAG. [facility] Is Patient Diabetic? Yes  Nutritional Status BMI of > 30 = obese CBG Result 170  Have you ever been in a relationship where you felt threatened, hurt or afraid?No   Does patient need assistance? Functional Status Self care Ambulation Normal Comments DM  / FOLLOW UP APPT   Primary Care Provider:  Rhea Pink  DO  CC:  FOLLOW UP APPT  / HAS APPT FOR THE GYN CLINIC  / STILL HAVE VAG. ITCH.  History of Present Illness: 50 yr old woman with pmhx as described below comes to the clinic complaining of vaginal discharge, itching, and pain on defecation since 2 weeks. No new partners. Denies chills, abdominal pain, n/v. Last time patient had sexual intercourse was 4 months ago.  Last menstrual cycle was the end of August. Patient is regular. Period last for 5-7 days.   Patient can not afford humulin or lantus and hence has not been taking any insulin for the last 2 months. She states that she has applied for the MAP and wasn't qualified. She denies any other complaints.  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 Take One Tablet Twice Daily 2)  Amitriptyline Hcl 25 Mg  Tabs (Amitriptyline Hcl) .... Take Three  Tablets At Bedtime 3)  Ambien 10 Mg  Tabs (Zolpidem Tartrate) .... Take 1 Tab By Mouth At Bedtime 4)  Synthroid 175 Mcg Tabs (Levothyroxine Sodium) .... Take 1 Tablet By Mouth Once A Day in The Morning With An Empty Stomach. 5)  Albuterol 90 Mcg/act  Aers (Albuterol) .... One Puff Every 4 Hours As Needed 6)  Simply Saline 0.9 % Aers (Saline) .... Use Daily As Directed and As Needed 7)  Famotidine 20 Mg Tabs (Famotidine) .... Take 1 Tablet By Mouth Two Times A Day 8)  Pen  Needles 31g X 8 Mm Misc (Insulin Pen Needle) .... Use To Inject Insulin 4x Daily Before Meals and Bedtime 9)  Humalog Kwikpen 100 Unit/ml Soln (Insulin Lispro (Human)) .... Inject 15 Units Before Breakfast, Lunch and Dinner. Keep Carbohydrate Intake Between 50-60 Grams Each Meal. 10)  Lantus Solostar 100 Unit/ml Soln (Insulin Glargine) .... Inject 50 Units Every Evening  ~ 9 Pm 11)  Colace 100 Mg Caps (Docusate Sodium) .... Take 1 Tablet By Mouth Twice A Day 12)  Flagyl 500 Mg Tabs (Metronidazole) .... Take 1 Tablet By Mouth Twice A Day For 7 Days  Allergies: 1)  ! 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 MULTIPLE SITES ARTHROPATHY -  L shoulder bursitis (subacromial/ subdeltoid) - MRI 05/16/06    - neg xray 2/00. Irreg contour acromion, otw nl- 4/05    - s/p steroid injection, 2/00    - s/p ortho referral to [place], Dr. 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 Currently on Ambien CR trial APPENDECTOMY, HX OF  DEPRESSION, since 4/00 -  Elavil 4 wks 4/00- 5/00 then 8/00, pt stopped on own -  Paxil 5/00- 6/01, swithced to Zoloft 1/03 -  Currently off meds  Family History: Last updated: 01/17/2009 Cancer on father side, unknown Mother: HTN, thyroid problem  Family History: Reviewed history from 01/17/2009 and no changes required. Cancer on father side, unknown Mother: HTN, thyroid problem  Social History: Reviewed history from 07/07/2008 and no changes required. 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  Review of Systems      See HPI  Physical Exam  General:  Well-developed,well-nourished,in no acute distress; alert,appropriate and cooperative throughout examination Neck:  No deformities, masses, or tenderness noted. Lungs:  Normal respiratory effort, chest expands symmetrically. Lungs are clear to auscultation, no crackles or wheezes. Heart:  Normal rate and regular rhythm. S1 and S2 normal without gallop, murmur, click, rub  or other extra sounds. Abdomen:  soft, non-tender, and normal bowel sounds.   Genitalia:  normal introitus, no external lesions, no vaginal or cervical lesions, no vaginal atrophy, no friaility or hemorrhage, and whitish colored thick vaginal discharge noted and sent for labs. Msk:  No deformity or scoliosis noted of thoracic or lumbar spine.   Pulses:  R radial normal.   Extremities:  No clubbing, cyanosis, edema, or deformity noted with normal full range of motion of all joints.   Neurologic:  alert & oriented X3, strength normal in all extremities, and gait normal.     Impression & Recommendations:  Problem # 1:  VAGINAL DISCHARGE (ICD-623.5)  Thick whitish colored discharge. Given itching and discharge, suspect yeast infection. Will send wet prep and  willfollow up. Will treat accordingly.  Orders: T-Wet Prep by [facility] [phone])  Problem # 2:  DIABETES MELLITUS, TYPE II (ICD-250.00)  Uncontrolled. Patient doesn't like insulin syringes and only prefers pens. Will fax her details to [facility] and see if she gets any help from there. Will start her on Metformin and Glipizide therapy. Will give Lantus solostar samples from the clinic. Since I am starting her on oral agents, will decrease the lantus strength to 15 units. Will follow up in 1 month. Will also stop the humalog as she is not taking it. Discussed with Barnabas Harries, who also agreed with the plan. The following medications were removed from the medication list:    Humalog Kwikpen 100 Unit/ml Soln (Insulin lispro (human)) ..... Inject 15 units before breakfast, lunch and dinner. keep carbohydrate intake between 50-60 grams each meal. Her updated medication list for this problem includes:    Lantus Solostar 100 Unit/ml Soln (Insulin glargine) ..... Inject 15 units every evening  ~ 9 pm    Metformin Hcl 500 Mg Tabs (Metformin hcl) .Marland Kitchen... Take 1 tablet by mouth two times a day    Glipizide 5 Mg Tabs  (Glipizide) .Marland Kitchen... Take 1 tablet by mouth once a day  Orders: Capillary Blood Glucose/CBG [id number])  The following medications were removed from the medication list:    Humalog Kwikpen 100 Unit/ml Soln (Insulin lispro (human)) ..... Inject 15 units before breakfast, lunch and dinner. keep carbohydrate intake between 50-60 grams each meal. Her updated medication list for this problem includes:    Lantus Solostar 100 Unit/ml Soln (Insulin glargine) ..... Inject 15 units every evening  ~ 9 pm    Metformin Hcl 500 Mg Tabs (Metformin hcl) .Marland Kitchen... Take 1 tablet by mouth two times a day    Glipizide 5 Mg Tabs (Glipizide) .Marland Kitchen... Take 1 tablet by mouth once a day   Labs Reviewed: Creat: 0.55 (07/21/2008)     Last Eye Exam: Results: Normal. Location:[facility] Dr. Mayford Knife (11/19/2007) Reviewed HgBA1c results: 10.7 (06/21/2009)  9.2 (09/08/2008)   Problem # 3:  PAP SMEAR, ABNORMAL (ICD-795.00) Has an appointment with Gyn on oct 20th. Patient is aware of this. Will fllow up on their recs.  Problem # 4:  EXTERNAL HEMORRHOIDS (ICD-455.3) No active issues.  Complete Medication List: 1)  Neurontin 600 Mg Tabs (Gabapentin) .... Take one tablet twice daily 2)  Amitriptyline Hcl 25 Mg Tabs (Amitriptyline hcl) .... Take three  tablets at bedtime 3)  Ambien 10 Mg Tabs (Zolpidem tartrate) .... Take 1 tab by mouth at bedtime 4)  Synthroid 175 Mcg Tabs (Levothyroxine sodium) .... Take 1 tablet by mouth once a day in the morning with an empty stomach. 5)  Albuterol 90 Mcg/act Aers (Albuterol) .... One puff every 4 hours as needed 6)  Simply Saline 0.9 % Aers (Saline) .... Use daily as directed and as needed 7)  Famotidine 20 Mg Tabs (Famotidine) .... Take 1 tablet by mouth two times a day 8)  Pen Needles 31g X 8 Mm Misc (Insulin pen needle) .... Use to inject insulin 4x daily before meals and bedtime 9)  Lantus Solostar 100 Unit/ml Soln (Insulin glargine) .... Inject 15 units every evening  ~  9 pm 10)  Colace 100 Mg Caps (Docusate sodium) .... Take 1 tablet by mouth twice a day 11)  Flagyl 500 Mg Tabs (Metronidazole) .... Take 1 tablet by mouth twice a day for 7 days 12)  Metformin Hcl 500 Mg  Tabs (Metformin hcl) .... Take 1 tablet by mouth two times a day 13)  Glipizide 5 Mg Tabs (Glipizide) .... Take 1 tablet by mouth once a day Capillary Blood Glucose/CBG [id number]) T-Wet Prep by [facility] [phone])  Patient Instructions: 1)  Please schedule a follow-up appointment in 1 month. 2)  Start taking Metformin and Glipizide as advised below. 3)  It is important that you exercise regularly at least 20 minutes 5 times a week. If you develop chest pain, have severe difficulty breathing, or feel very tired , stop exercising immediately and seek medical attention. 4)  You need to lose weight. Consider a lower calorie diet and regular exercise.  5)  Check your blood sugars regularly. If your readings are usually above : or below 70 you should contact our office. Prescriptions: GLIPIZIDE 5 MG TABS (GLIPIZIDE) Take 1 tablet by mouth once a day  #30 x 3   Entered and Authorized by:   Yolonda Kida MD   Signed by:   Yolonda Kida MD on 07/05/2009   Method used:   Print then Give to Patient   RxID:   [id number] METFORMIN HCL 500 MG TABS (METFORMIN HCL) Take 1 tablet by mouth two times a day  #60 x 3   Entered and Authorized by:   Yolonda Kida MD   Signed by:   Yolonda Kida MD on 07/05/2009   Method used:   Print then Give to Patient   [id number]  Process Orders Check Orders Results:     [facility]: [id number] not required for this insurance Tests Sent for requisitioning (July 05, 2009 1:47 PM):     07/05/2009: [facility] -- T-Wet Prep by [facility] [phone] (signed)    Process Orders Check Orders Results:     [facility]: [id number] not required for this insurance Tests Sent for requisitioning (July 05, 2009 1:47 PM):     07/05/2009: [facility] -- T-Wet Prep [facility] [phone] (signed)     Impression & Recommendations:  Orders: T-Wet Prep by [facility] [phone])  The following medications were removed from the medication list:    Humalog Kwikpen 100 Unit/ml Soln (Insulin lispro (human)) ..... Inject 15 units before breakfast, lunch and dinner. keep carbohydrate intake between 50-60 grams each meal. Her updated medication list for this problem includes:    Lantus Solostar 100 Unit/ml Soln (Insulin glargine) ..... Inject 15 units every evening  ~ 9 pm    Metformin Hcl 500 Mg Tabs (Metformin hcl) .Marland Kitchen... Take 1 tablet by mouth two times a day    Glipizide 5 Mg Tabs (Glipizide) .Marland Kitchen... Take 1 tablet by mouth once a day Orders: Capillary Blood Glucose/CBG [id number])  The following medications were removed from the medication list:    Humalog Kwikpen 100 Unit/ml Soln (Insulin lispro (human)) ..... Inject 15 units before breakfast, lunch and dinner. keep carbohydrate intake between 50-60 grams each meal.  Her updated medication list for this problem includes:    Lantus Solostar 100 Unit/ml Soln (Insulin glargine) ..... Inject 15 units every evening  ~ 9 pm    Metformin Hcl 500 Mg Tabs (Metformin hcl) .Marland Kitchen... Take 1 tablet by mouth two times a day    Glipizide 5 Mg Tabs (Glipizide) .Marland Kitchen... Take 1 tablet by mouth once a day  The following medications were removed from the medication list:    Humalog Kwikpen 100 Unit/ml Soln (Insulin lispro (human)) ..... Inject 15 units before  breakfast, lunch and dinner. keep carbohydrate intake between 50-60 grams each meal.  Her updated medication list for this problem includes:    Lantus Solostar 100 Unit/ml Soln (Insulin glargine) ..... Inject 15 units every evening  ~ 9 pm    Metformin Hcl 500 Mg Tabs (Metformin hcl) .Marland Kitchen... Take 1 tablet by mouth two times a day    Glipizide 5 Mg Tabs (Glipizide) .Marland Kitchen... Take 1 tablet by  mouth once a day   Complete Medication List: 1)  Neurontin 600 Mg Tabs (Gabapentin) .... Take one tablet twice daily 2)  Amitriptyline Hcl 25 Mg Tabs (Amitriptyline hcl) .... Take three  tablets at bedtime 3)  Ambien 10 Mg Tabs (Zolpidem tartrate) .... Take 1 tab by mouth at bedtime 4)  Synthroid 175 Mcg Tabs (Levothyroxine sodium) .... Take 1 tablet by mouth once a day in the morning with an empty stomach. 5)  Albuterol 90 Mcg/act Aers (Albuterol) .... One puff every 4 hours as needed 6)  Simply Saline 0.9 % Aers (Saline) .... Use daily as directed and as needed 7)  Famotidine 20 Mg Tabs (Famotidine) .... Take 1 tablet by mouth two times a day 8)  Pen Needles 31g X 8 Mm Misc (Insulin pen needle) .... Use to inject insulin 4x daily before meals and bedtime 9)  Lantus Solostar 100 Unit/ml Soln (Insulin glargine) .... Inject 15 units every evening  ~ 9 pm 10)  Colace 100 Mg Caps (Docusate sodium) .... Take 1 tablet by mouth twice a day 11)  Flagyl 500 Mg Tabs (Metronidazole) .... Take 1 tablet by mouth twice a day for 7 days 12)  Metformin Hcl 500 Mg Tabs (Metformin hcl) .... Take 1 tablet by mouth two times a day 13)  Glipizide 5 Mg Tabs (Glipizide) .... Take 1 tablet by mouth once a day

## 2010-10-30 NOTE — Progress Notes (Signed)
Summary: refill request/dms  Phone Note Refill Request Message from:  Fax from Pharmacy on November 25, 2006 9:02 AM  Refills Requested: Medication #1:  AMBIEN 5 MG TABS take by mouth 5-10mg  tablet at bedtime an needed. Preload says 5mg . Request says 10mg . Last dictation says 12.5 CR. Please change/refill dose as appropriate.   Method Requested: Fax to [facility] Initial call taken by: Pollyann Samples,  November 25, 2006 9:04 AM  Follow-up for Phone Call        Continue Ambien 10 mg tablets- 1-2 tablets at bedtime as needed. Refill approved. Nurse to complete.  Follow-up by: Luane School MD,  November 25, 2006 9:39 AM  Additional Follow-up for Phone Call Additional follow up Details #1::        Rx faxed to pharmacy Additional Follow-up by: Pollyann Samples,  November 25, 2006 11:25 AM  New/Updated Medications: AMBIEN 10 MG TABS (ZOLPIDEM TARTRATE) Take 1/2 to 1 tablet at bedtime as needed for sleep  New/Updated Medications: AMBIEN 10 MG TABS (ZOLPIDEM TARTRATE) Take 1/2 to 1 tablet at bedtime as needed for sleep  Prescriptions: AMBIEN 10 MG TABS (ZOLPIDEM TARTRATE) Take 1/2 to 1 tablet at bedtime as needed for sleep  #30 x 3   Entered by:   Luane School MD   Authorized by:   Bertha Stakes MD   Signed by:   Luane School MD on 11/25/2006   Method used:   Telephoned to ...       [facility]       [address] [place], [place]  [address]       Ph: [phone] or [phone]       Fax: [phone]   RxID:   [id number]

## 2010-10-30 NOTE — Assessment & Plan Note (Signed)
Summary: check up [mkj]   Vital Signs:  Patient Profile:   50 Years Old Female Height:     63 inches (160.02 cm) Weight:      214.6 pounds (97.55 kg) BMI:     38.15 Temp:     98.0 degrees F (36.67 degrees C) oral Pulse rate:   93 / minute BP sitting:   118 / 70  (right arm)  Pt. in pain?   no  Vitals Entered By: Hilda Blades Ditzler RN (July 07, 2008 9:27 AM)              Is Patient Diabetic? Yes  Nutritional Status BMI of > 30 = obese CBG Result 100  Have you ever been in a relationship where you felt threatened, hurt or afraid?denies   Does patient need assistance? Functional Status Self care Ambulation Normal     Chief Complaint:  To meet Dr Hilma Favors, cough x 10 years, and tingling in hands and feet and to ck right upper back - inc in size.Marland Kitchen  History of Present Illness: Kayla [name] is a 50 year old w/PMH sig for DM2, Hypothyroidism, and HLD who comes in today for routine care and for continued evaluation of a chronic cough. She has a long hx of chronic cough which has had a very disjointed evaluation. 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 She says that chemicals, perfumes, or damp moldy environments will make her have severe dyspnea, wheezing and nasal congestion. Her cough has been present for months to >year. She is s/p radiation for Graves dx. She has never been evaluated for GERD. No documented PFTs. Only empiric tx for allergies and asthma and occasional treatment of acute bronchitis with short abx courses. Her cough preceded starting an ACE Inhibitor. She reports overall poor control of her DM2 and is frustrated by her current regimen.    Updated Prior Medication List: NEURONTIN 600 MG  TABS (GABAPENTIN) Take one tablet twice daily AMITRIPTYLINE HCL 25 MG  TABS (AMITRIPTYLINE HCL) Take three  tablets at  bedtime AMBIEN 10 MG  TABS (ZOLPIDEM TARTRATE) Take 1 tab by mouth at bedtime SYNTHROID 175 MCG TABS (LEVOTHYROXINE SODIUM) Take 1 tablet by mouth once a day In the morning with an empty stomach. ACTOS 45 MG  TABS (PIOGLITAZONE HCL) Take 1 tablet by mouth once a day ALBUTEROL 90 MCG/ACT  AERS (ALBUTEROL) One puff every 4 hours as needed PRAVASTATIN SODIUM 40 MG  TABS (PRAVASTATIN SODIUM) Take 1 tablet by mouth once a day LISINOPRIL 5 MG TABS (LISINOPRIL) Take 1 tablet by mouth once a day TUSSIONEX PENNKINETIC ER 8-10 MG/5ML LQCR (CHLORPHENIRAMINE-HYDROCODONE) 1 tsp every 12 hours SIMPLY SALINE 0.9 % AERS (SALINE) use daily as directed and as needed ATROVENT 0.06 % SOLN (IPRATROPIUM BROMIDE) Use Intra-nasally two times a day as directed  Current Allergies (reviewed today): ! 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       MULTIPLE SITES ARTHROPATHY       -  L shoulder bursitis (subacromial/ subdeltoid) - MRI 05/16/06          - neg xray 2/00. Irreg contour acromion, otw nl- 4/05          - s/p steroid injection, 2/00          - s/p ortho referral to [facility], Dr. 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   Risk Factors: Tobacco use:  quit    Year quit:  1990's Alcohol use:  no Exercise:  yes    Times per week:  3    Type:  walking Seatbelt use:  100 %  Mammogram History:    Date of Last Mammogram:  04/01/2007   Review of Systems      See HPI   Physical Exam  General:     Well-developed,well-nourished,in no acute distress; alert,appropriate and cooperative throughout examination Mouth:     tonsilar erythema no plaques Neck:     supple, no adenopathy Lungs:     Normal respiratory effort, chest expands symmetrically. Lungs are clear to auscultation, no crackles or wheezes. Heart:     Normal rate and regular rhythm. S1 and S2 normal without gallop, murmur, click, rub or other extra sounds. Abdomen:     soft and non-tender.   Extremities:     No edema  Skin:     Intact without suspicious lesions or rashes    Impression & Recommendations:  Problem # 1:  HYPOTHYROIDISM (ICD-244.9) Hx of radiation ablation and resulting hypothyroid state. She has been very hypothyroid on prior visits with a TSH of >13. She has had lethargy and weight gain as well.  Will recheck thyroid levels today.  F/U in 1-2 weeks.  Her updated medication list for this problem includes:  Synthroid 175 Mcg Tabs (Levothyroxine sodium) .Marland Kitchen... Take 1 tablet by mouth once a day in the morning with an empty stomach.  Orders: T-T4, Free [phone]) T3, Free [phone]) T-T3, Free [phone])   Problem # 2:  BRONCHITIS NOT SPECIFIED AS ACUTE OR CHRONIC  (ICD-490) Recurrent. Diagnosis update to reflect a Chronic Cough. No active infection.  Her updated medication list for this problem includes:    Albuterol 90 Mcg/act Aers (Albuterol) ..... One puff every 4 hours as needed    Tussionex Pennkinetic Er 8-10 Mg/31ml Lqcr (Chlorpheniramine-hydrocodone) .Marland Kitchen... 1 tsp every 12 hours  Orders: PFT Baseline-Pre/Post Bronchodiolator (PFT Baseline-Pre/Pos)   Problem # 3:  DIABETES MELLITUS, TYPE II (ICD-250.00) Not on an ideal regimen.She is on Actos and 70/30. Her A1C was 10 last month. She has increased her insulin on her own.  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 She is concerned about wt. gain and insulin. She does not routinely check her glucose levels. Other than her [id number] I am not certian what her daily/weekly trends are as a whole.  Plan: DME F/U in 1-2 weeks -->at follow up we need to verify that all of her yearly screenings have been completed.    The following medications were removed from the medication list:    Humalog Mix 75/25 Pen 75-25 % Susp (Insulin lisp prot & lisp (hum)) .Marland Kitchen... Take 65 units two times a day  Her updated medication list for this problem includes:    Actos 45 Mg Tabs (Pioglitazone hcl) .Marland Kitchen... Take 1 tablet by mouth once a day    Lisinopril 5 Mg Tabs (Lisinopril) .Marland Kitchen... Take 1 tablet by mouth once a day    Humalog Kwikpen 100 Unit/ml Soln (Insulin lispro (human)) ..... Inject 15 units before breakfast, lunch and dinner. keep carbohydrate intake between 50-60 grams each meal.    Lantus Solostar 100 Unit/ml Soln (Insulin glargine) ..... Inject 40 units every evening  ~ 9 pm  Orders: Capillary Blood Glucose [id number]) Fingerstick [id number])  Labs Reviewed: HgBA1c:  10.2 (06/13/2008)   Creat: 0.65 (12/16/2007)   Microalbumin: 23.00 (06/13/2008)  Last Eye Exam: Results: Normal. Location:[facility] Dr. Mayford Knife (11/19/2007)   Problem # 4:  COUGH, CHRONIC (ICD-786.2) Her cough has been present for months to >year. Cough is worse w/ exposures in environment and at night but persists through most of the day. She is s/p radiation for Graves dx. She has never been evaluated for GERD or empirically tried treatment. No documented PFTs. Only empiric tx for allergies and asthma and occasional treatment of acute bronchitis with short abx courses. Her cough preceded starting on an ACE. Last CXR was in 2004-normal. Minimal relief with antihistamines. Brochodilators are intermittently helpful. She has never taken oral steroids. No relief with nasal steroids for post nasal gtt. No formal allergy eval.  I think her cough is mutifactorial. She has some clinical features that make me think reflux might be playing a role such as the irritated throat and worsening of symptoms at night--she may need ph monitoring since she asymptomatic other than her cough. She also clearly has a reative airway due to environmental exposures.  Plan: Obtain PFT's and verify Asthma Check IgE Refer to Allergy Specialist for full evaluation at  her next visit pending results Avoid inhaled steroids/dry powders as to not worsen irritation and cause infection Tussionex for hs symptom control Famotidine for reflux-->can try PPI if this is not helpful-->may eventually need pH monitoring Continue albuterol Ok to continue ACE Use twice daily saline sinus flush as directed for PND  Problem # 5:  Preventive Health Care (ICD-V70.0) Last mammogram 7/08. Schedule at next appointment. Check Lipids when fasting.  Complete Medication List: 1)  Neurontin 600 Mg Tabs (Gabapentin) .... Take one tablet twice daily 2)  Amitriptyline Hcl 25 Mg Tabs (Amitriptyline hcl) .... Take three  tablets at  bedtime 3)  Ambien 10 Mg Tabs (Zolpidem tartrate) .... Take 1 tab by mouth at bedtime 4)  Synthroid 175 Mcg Tabs (Levothyroxine sodium) .... Take 1 tablet by mouth once a day in the morning with an empty stomach. 5)  Actos 45 Mg Tabs (Pioglitazone hcl) .... Take 1 tablet by mouth once a day 6)  Albuterol 90 Mcg/act Aers (Albuterol) .... One puff every 4 hours as needed 7)  Pravastatin Sodium 40 Mg Tabs (Pravastatin sodium) .... Take 1 tablet by mouth once a day 8)  Lisinopril 5 Mg Tabs (Lisinopril) .... Take 1 tablet by mouth once a day 9)  Tussionex Pennkinetic Er 8-10 Mg/42ml Lqcr (Chlorpheniramine-hydrocodone) .Marland Kitchen.. 1 tsp every 12 hours 10)  Simply Saline 0.9 % Aers (Saline) .... Use daily as directed and as needed 11)  Atrovent 0.06 % Soln (Ipratropium bromide) .... Use intra-nasally two times a day as directed 12)  Famotidine 20 Mg Tabs (Famotidine) .... Take 1 tablet by mouth two times a day 13)  Pen Needles 31g X 8 Mm Misc (Insulin pen needle) .... Use to inject insulin 4x daily before meals and bedtime 14)  Humalog Kwikpen 100 Unit/ml Soln (Insulin lispro (human)) .... Inject 15 units before breakfast, lunch and dinner. keep carbohydrate intake between 50-60 grams each meal. 15)  Lantus Solostar 100 Unit/ml Soln (Insulin glargine) .... Inject 40 units every evening  ~ 9 pm  Other Orders: T-IgE (Immunoglobulin E) [id number])   Patient Instructions: 1)  Meet with Diabetes Education to explore choices of Insulin/medication. 2)  Will set up PFT's. 3)  Start Ipatropium nasal spray. Continue albuterol. 4)  We are checking T4,T3 today. 5)  Use SIMPLY SALINE NASAL SPAY daily. 6)  Refilled Tussionex today.   Prescriptions: LANTUS SOLOSTAR 100 UNIT/ML SOLN (INSULIN GLARGINE) inject 40 units every evening  ~ 9 pm Brand medically necessary #1 box x 6   Entered by:   Barnabas Harries RD,CDE   Authorized by:   Rhea Pink  DO   Signed by:   Rhea Pink  DO on 07/07/2008   Method used:    Electronically to        [facility] [phone]* (retail)       [place], [place]  [address]       Ph: [phone] or [phone]       Fax: [phone]   RxID:   [id number] HUMALOG KWIKPEN 100 UNIT/ML SOLN (INSULIN LISPRO (HUMAN)) inject 15 units before breakfast, lunch and dinner. keep carbohydrate intake between 50-60 grams each meal.  #1 box x 6   Entered by:   Barnabas Harries RD,CDE   Authorized by:   Rhea Pink  DO   Signed by:   Rhea Pink  DO on 07/07/2008   Method used:   Electronically  to        [facility] [phone]* (retail)       [place], [place]  [address]       Ph: [phone] or [phone]       Fax: [phone]   RxID:   [id number] PEN NEEDLES 31G X 8 MM MISC (INSULIN PEN NEEDLE) use to inject insulin 4x daily before meals and bedtime  #200 x 7   Entered by:   Barnabas Harries RD,CDE   Authorized by:   Rhea Pink  DO   Signed by:   Rhea Pink  DO on 07/07/2008   Method used:   Telephoned to ...       [facility] (retail)       [address]       [place], [place]  [address]       Ph: [phone]       Fax: [phone]   RxID:   [id number] AMBIEN 10 MG  TABS (ZOLPIDEM TARTRATE) Take 1 tab by mouth at bedtime  #30 x 3   Entered and Authorized by:   Rhea Pink  DO   Signed by:   Rhea Pink  DO on 07/07/2008   Method used:   Print then Give to Patient   RxID:   [id number] AMBIEN 10 MG  TABS (ZOLPIDEM TARTRATE) Take 1 tab by mouth at bedtime  #30 x 3   Entered and Authorized by:   Rhea Pink  DO   Signed by:   Rhea Pink  DO on 07/07/2008   Method used:   Print then Give to Patient   [id number]   Handout requested. FAMOTIDINE 20 MG TABS (FAMOTIDINE) Take 1 tablet by mouth two times a day  #60 x 3   Entered and Authorized by:   Rhea Pink  DO   Signed by:   Rhea Pink  DO on 07/07/2008   Method used:    Print then Give to Patient   RxID:   [id number] ATROVENT 0.06 % SOLN (IPRATROPIUM BROMIDE) Use Intra-nasally two times a day as directed  #1 x 6   Entered and Authorized by:   Rhea Pink  DO   Signed by:   Rhea Pink  DO on 07/07/2008   Method used:   Print then Give to Patient   [id number] SIMPLY SALINE 0.9 % AERS (SALINE) use daily as directed and as needed  #1 x prn   Entered and Authorized by:   Rhea Pink  DO   Signed by:   Rhea Pink  DO on 07/07/2008   Method used:   Print then Give to Patient   RxID:   [id number] [place] ER 8-10 MG/5ML LQCR (CHLORPHENIRAMINE-HYDROCODONE) 1 tsp every 12 hours  #4 oz x 3   Entered and Authorized by:   Rhea Pink  DO   Signed by:   Rhea Pink  DO on 07/07/2008   Method used:   Print then Give to Patient   [id number]  ]

## 2010-10-30 NOTE — Miscellaneous (Signed)
Summary: Patient arrived 45 minutes late for appointment  Clinical Lists Changes  Orders: Added new Test order of T- * Misc. Laboratory test [phone]) - Signed Added new Test order of T-Hgb A1C (in-house) [phone]) - Signed  Appended Document: Results of HGB A1C    Lab Visit  Laboratory Results   Blood Tests   Date/Time Received: April 23, 2010 11:20 AM  Date/Time Reported: Kayla [name]  April 23, 2010 11:20 AM   HGBA1C: 7.0%   (Normal Range: Non-Diabetic - 3-6%   Control Diabetic - 6-8%)    Orders Today:

## 2010-10-30 NOTE — Assessment & Plan Note (Signed)
Summary: vaginal itching/gg   Vital Signs:  Patient profile:   50 year old female Height:      63 inches (160.02 cm) Weight:      174.4 pounds (79.27 kg) BMI:     31.01 Temp:     98.8 degrees F (37.11 degrees C) oral Pulse rate:   87 / minute BP sitting:   126 / 69  (right arm) Cuff size:   regular  Vitals Entered By: Lucky Rathke NT II (May 07, 2010 2:22 PM) CC: DM / VAGINAL ITCHING / SMELL TO URINE  / URINE OBTAINED FOR DIPPING/ MAMMOGRAM / FOOT REFERRAL-DM Is Patient Diabetic? Yes Did you bring your meter with you today? No Pain Assessment Patient in pain? no      Nutritional Status BMI of > 30 = obese  Have you ever been in a relationship where you felt threatened, hurt or afraid?No   Does patient need assistance? Functional Status Self care Ambulation Normal   Primary Care Provider:  Rhea Pink  DO  CC:  DM / VAGINAL ITCHING / SMELL TO URINE  / URINE OBTAINED FOR DIPPING/ MAMMOGRAM / FOOT REFERRAL-DM.  History of Present Illness: Vaginal irritation for 4 days with thick, yelowish and malodorous discharge. Patient is not sexually active for 2 years. LMP 2 months ago--irregular"lately." Denies dyparenunia, hematuria, dyysuria,frequency or urgency. patient is a diabetic.  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) 10)  Hyperlipidemia  (ICD-272.4) 11)  Hypothyroidism  (ICD-244.9) 12)  Anemia-nos   (ICD-285.9) 13)  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) 14)  Airway Obstruction  (ICD-519.8) 15)  Bronchitis Not Specified As Acute or Chronic  (ICD-490) 16)  Shoulder Pain  (ICD-719.41) 17)  Bursitis  (ICD-727.3) 18)  Depression  (ICD-311) 19)  Insomnia  (ICD-780.52) 20)  Arthropathy Nec, 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) 21)  Carpal Tunnel Syndrome  (ICD-354.0)  Current Problems (verified): 1)  Monilial Vaginitis  (ICD-112.1) 2)  Bacterial Vaginitis  (ICD-616.10) 3)  Need Proph Vacc&inoculat Agnst Oth Spec Disease  (ICD-V05.8) 4)  Hepatitis B Immunity  (ICD-V05.8) 5)  Ana Positive  (ICD-790.99) 6)  Urinary Tract Infection  (ICD-599.0) 7)  Pap Smear, Abnormal  (ICD-795.00) 8)  Vaginal Discharge  (ICD-623.5) 9)  External Hemorrhoids  (ICD-455.3) 10)  Obesity, Mild  (ICD-278.02) 11)  Diabetes Mellitus, Type II  (ICD-250.00) 12)  Proteinuria  (ICD-791.0) 13)  Neuropathy, 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 Take Two Tabs By Mouth Twice Daily 2)  Ambien 10 Mg  Tabs (Zolpidem Tartrate) .... Take 1 Tab By Mouth At Bedtime 3)  Synthroid 175 Mcg Tabs (Levothyroxine Sodium) .... Take 1 Tablet By Mouth Once A Day in The Morning With An Empty Stomach. 4)  Famotidine 20 Mg Tabs (Famotidine) .... Take 1 Tablet By Mouth Two Times A Day 5)  Pen Needles 31g X 8 Mm Misc (Insulin Pen Needle) .... Use To Inject Insulin 4x Daily Before Meals and Bedtime 6)  Lantus Solostar 100 Unit/ml Soln (Insulin Glargine) .... Inject 30  Units Every Evening  ~ 9 Pm 7)  Metformin Hcl 1000 Mg Tabs (Metformin Hcl) .... Take 1 Tablet By Mouth Two Times A Day 8)  Glucotrol Xl 10 Mg Xr24h-Tab (Glipizide) .... Take 1 Tablet By Mouth Once A Day 9)  Wellbutrin Sr 150 Mg Xr12h-Tab (Bupropion Hcl) .... Take 1 Tablet By Mouth Two Times A Day 10)  Losartan Potassium 25 Mg Tabs (Losartan Potassium) .... Take 1 Tablet By Mouth Once A Day 11)  Tramadol Hcl 50 Mg Tabs (Tramadol Hcl) .... Take One Tab By Mouth Every 6 Hours As Needed Pain 12)  Lortab 10-500 Mg Tabs (Hydrocodone-Acetaminophen) .... Take 1 Tablet By Mouth Once A Day At Bedtime 13)  Novolog Flexpen 100 Unit/ml Soln (Insulin Aspart) .... Inject 3-4 Units 15 Minutes Prior To Eating Each Meal and At Bedtime As Directed  Current Medications (verified): 1)  Neurontin 600 Mg  Tabs (Gabapentin) .... Take Two Tabs By Mouth Twice Daily 2)  Ambien 10 Mg  Tabs (Zolpidem Tartrate) .... Take 1 Tab By Mouth At Bedtime 3)  Synthroid 175 Mcg Tabs (Levothyroxine Sodium) .... Take 1 Tablet By Mouth Once A Day in The Morning With An Empty Stomach. 4)  Famotidine 20 Mg Tabs (Famotidine) .... Take 1 Tablet By Mouth Two Times A Day 5)  Pen Needles 31g X 8 Mm Misc (Insulin Pen Needle) .... Use To Inject Insulin 4x Daily Before Meals and Bedtime 6)  Lantus Solostar 100 Unit/ml Soln (Insulin Glargine) .... Inject 30 Units Every Evening  ~ 9 Pm 7)  Metformin Hcl 1000 Mg Tabs (Metformin Hcl) .... Take 1 Tablet By Mouth Two Times A Day 8)  Glucotrol Xl 10 Mg Xr24h-Tab (Glipizide) .... Take 1 Tablet By Mouth Once A Day 9)  Wellbutrin Sr 150 Mg Xr12h-Tab (Bupropion Hcl) .... Take 1 Tablet By Mouth Two Times A Day 10)  Losartan Potassium 25 Mg Tabs (Losartan Potassium) .... Take 1 Tablet By Mouth Once A Day 11)  Tramadol Hcl 50 Mg Tabs (Tramadol Hcl) .... Take One Tab By Mouth Every 6 Hours As Needed Pain 12)  Lortab 10-500 Mg Tabs (Hydrocodone-Acetaminophen) .... Take 1 Tablet By Mouth Once A Day At Bedtime 13)   Novolog Flexpen 100 Unit/ml Soln (Insulin Aspart) .... Inject 3-4 Units 15 Minutes Prior To Eating Each Meal and At Bedtime As Directed 14)  Fluconazole 150 Mg Tabs (Fluconazole) .... Take One Tablet By Mouth Every Other Day X 3 15)  Metronidazole 500 Mg Tabs (Metronidazole) .... Take 1 Tablet By Mouth Two Times A Day For 7 Days  Allergies (verified): 1)  ! 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 MULTIPLE SITES ARTHROPATHY -  L shoulder bursitis (subacromial/ subdeltoid) - MRI 05/16/06    - neg xray 2/00. Irreg contour acromion, otw nl- 4/05    - s/p steroid injection, 2/00    - s/p ortho referral to [place], Dr. 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  Risk Factors: Alcohol Use: 0 (05/07/2010) Exercise: yes (05/07/2010)  Risk Factors: Smoking Status: quit (05/07/2010)  Review of Systems       per HPI  Physical Exam  General:  Well-developed,well-nourished,in no acute distress; alert,appropriate and cooperative throughout examination Head:  Normocephalic and atraumatic without obvious abnormalities. No apparent alopecia or balding. Eyes:  No corneal or conjunctival inflammation noted. EOMI. Perrla. Funduscopic exam benign, without hemorrhages, exudates or papilledema. Vision grossly normal. Ears:  External ear exam shows no significant lesions or deformities.  Otoscopic examination reveals clear canals, tympanic membranes are intact bilaterally without bulging, retraction, inflammation or discharge. Hearing is grossly normal bilaterally. Nose:  External nasal examination shows no deformity or inflammation. Nasal mucosa are pink and moist without lesions or exudates. Mouth:  Oral mucosa and oropharynx without lesions or exudates.  Teeth in good repair. Neck:  No deformities, masses, or tenderness noted. Lungs:  Normal respiratory effort, chest expands symmetrically. Lungs are clear to auscultation, no crackles or wheezes. Heart:  Normal rate and regular rhythm. S1 and S2 normal without gallop, murmur, click, rub or other extra sounds. Abdomen:  Bowel sounds positive,abdomen soft and non-tender without masses, organomegaly or hernias noted. Rectal:  No external abnormalities noted. Normal sphincter tone. No rectal masses or tenderness. Genitalia:  normal introitus, no external lesions, mucosa pink and moist, no vaginal or cervical lesions, no vaginal atrophy, no friaility or hemorrhage, normal uterus size and position, no adnexal masses or tenderness, and vaginal discharge.  normal introitus, no external lesions, mucosa pink and moist, no vaginal or cervical lesions, no  vaginal atrophy, no friaility or hemorrhage, normal uterus size and position, no adnexal masses or tenderness, and vaginal discharge.  normal introitus, no external lesions, mucosa pink and moist, no vaginal or cervical lesions, no vaginal atrophy, no friaility or hemorrhage, normal uterus size and position, and no adnexal masses or tenderness.   Msk:  No deformity or scoliosis noted of thoracic or lumbar spine.   Pulses:  R and L carotid,radial,femoral,dorsalis pedis and posterior tibial pulses are full and equal bilaterally Extremities:  No clubbing, cyanosis,  edema, or deformity noted with normal full range of motion of all joints.   Neurologic:  No cranial nerve deficits noted. Station and gait are normal. Plantar reflexes are down-going bilaterally. DTRs are symmetrical throughout. Sensory, motor and coordinative functions appear intact. Skin:  Intact without suspicious lesions or rashes Psych:  Cognition and judgment appear intact. Alert and cooperative with normal attention span and concentration. No apparent delusions, illusions, hallucinations  Diabetes Management Exam:    Foot Exam (with socks and/or shoes not present):       Sensory-Pinprick/Light touch:          Left medial foot (L-4): diminished          Left dorsal foot (L-5): normal          Left lateral foot (S-1): normal          Right medial foot (L-4): normal          Right dorsal foot (L-5): diminished          Right lateral foot (S-1): diminished       Sensory-Monofilament:          Right foot: normal       Inspection:          Left foot: normal          Right foot: normal       Nails:          Left foot: thickened          Right foot: thickened   Impression & Recommendations:  Problem # 1:  MONILIAL VAGINITIS (ICD-112.1)  Her updated medication list for this problem includes:    Fluconazole 150 Mg Tabs (Fluconazole) .Marland Kitchen... Take one tablet by mouth every other day x 3  Discussed treatment regimen and preventive  measures.   Her updated medication list for this problem includes:    Fluconazole 150 Mg Tabs (Fluconazole) .Marland Kitchen... Take one tablet by mouth every other day x 3  Her updated medication list for this problem includes:    Fluconazole 150 Mg Tabs (Fluconazole) .Marland Kitchen... Take one tablet by mouth every other day x 3  Problem # 2:  BACTERIAL VAGINITIS (ICD-616.10)  Her updated medication list for this problem includes:    Metronidazole 500 Mg Tabs (Metronidazole) .Marland Kitchen... Take 1 tablet by mouth two times a day for 7 days  Discussed symptomatic relief and treatment options.   Her updated medication list for this problem includes:    Metronidazole 500 Mg Tabs (Metronidazole) .Marland Kitchen... Take 1 tablet by mouth two times a day for 7 days    Her updated medication list for this problem includes:    Metronidazole 500 Mg Tabs (Metronidazole) .Marland Kitchen... Take 1 tablet by mouth two times a day for 7 days  Orders: T-Urine Pregnancy (in -house) [phone])  Problem # 3:  DIABETES MELLITUS, TYPE II (ICD-250.00)  Her updated medication list for this problem includes:    Lantus Solostar 100 Unit/ml Soln (Insulin glargine) ..... Inject 30 units every evening  ~ 9 pm    Metformin Hcl 1000 Mg Tabs (Metformin hcl) .Marland Kitchen... Take 1 tablet by mouth two times a day    Glucotrol Xl 10 Mg Xr24h-tab (Glipizide) .Marland Kitchen... Take 1 tablet by mouth once a day    Losartan Potassium 25 Mg Tabs (Losartan potassium) .Marland Kitchen... Take 1 tablet by mouth once a day    Novolog Flexpen 100 Unit/ml Soln (Insulin aspart) ..... Inject 3-4 units 15 minutes prior to eating each meal and at  bedtime as directed  Orders: Ophthalmology Referral (Ophthalmology) Podiatry Referral (Podiatry)  Labs Reviewed: Creat: 0.80 (04/23/2010)     Last Eye Exam: Results: Normal. Location:[facility] Knife (11/19/2007) Reviewed HgBA1c results: 7.0 (04/23/2010)  6.5 (02/08/2010)  Her updated medication list for this problem includes:    Lantus Solostar  100 Unit/ml Soln (Insulin glargine) ..... Inject 30 units every evening  ~ 9 pm    Metformin Hcl 1000 Mg Tabs (Metformin hcl) .Marland Kitchen... Take 1 tablet by mouth two times a day    Glucotrol Xl 10 Mg Xr24h-tab (Glipizide) .Marland Kitchen... Take 1 tablet by mouth once a day    Losartan Potassium 25 Mg Tabs (Losartan potassium) .Marland Kitchen... Take 1 tablet by mouth once a day    Novolog Flexpen 100 Unit/ml Soln (Insulin aspart) ..... Inject 3-4 units 15 minutes prior to eating each meal and at bedtime as directed  Her updated medication list for this problem includes:    Lantus Solostar 100 Unit/ml Soln (Insulin glargine) ..... Inject 30 units every evening  ~ 9 pm    Metformin Hcl 1000 Mg Tabs (Metformin hcl) .Marland Kitchen... Take 1 tablet by mouth two times a day    Glucotrol Xl 10 Mg Xr24h-tab (Glipizide) .Marland Kitchen... Take 1 tablet by mouth once a day    Losartan Potassium 25 Mg Tabs (Losartan potassium) .Marland Kitchen... Take 1 tablet by mouth once a day    Novolog Flexpen 100 Unit/ml Soln (Insulin aspart) ..... Inject 3-4 units 15 minutes prior to eating each meal and at bedtime as directed  Complete Medication List: 1)  Neurontin 600 Mg Tabs (Gabapentin) .... Take two tabs by mouth twice daily 2)  Ambien 10 Mg Tabs (Zolpidem tartrate) .... Take 1 tab by mouth at bedtime 3)  Synthroid 175 Mcg Tabs (Levothyroxine sodium) .... Take 1 tablet by mouth once a day in the morning with an empty stomach. 4)  Famotidine 20 Mg Tabs (Famotidine) .... Take 1 tablet by mouth two times a day 5)  Pen Needles 31g X 8 Mm Misc (Insulin pen needle) .... Use to inject insulin 4x daily before meals and bedtime 6)  Lantus Solostar 100 Unit/ml Soln (Insulin glargine) .... Inject 30 units every evening  ~ 9 pm 7)  Metformin Hcl 1000 Mg Tabs (Metformin hcl) .... Take 1 tablet by mouth two times a day 8)  Glucotrol Xl 10 Mg Xr24h-tab (Glipizide) .... Take 1 tablet by mouth once a day 9)  Wellbutrin Sr 150 Mg Xr12h-tab (Bupropion hcl) .... Take 1 tablet by mouth two times a  day 10)  Losartan Potassium 25 Mg Tabs (Losartan potassium) .... Take 1 tablet by mouth once a day 11)  Tramadol Hcl 50 Mg Tabs (Tramadol hcl) .... Take one tab by mouth every 6 hours as needed pain 12)  Lortab 10-500 Mg Tabs (Hydrocodone-acetaminophen) .... Take 1 tablet by mouth once a day at bedtime 13)  Novolog Flexpen 100 Unit/ml Soln (Insulin aspart) .... Inject 3-4 units 15 minutes prior to eating each meal and at bedtime as directed 14)  Fluconazole 150 Mg Tabs (Fluconazole) .... Take one tablet by mouth every other day x 3 15)  Metronidazole 500 Mg Tabs (Metronidazole) .... Take 1 tablet by mouth two times a day for 7 days  Other Orders: Mammogram (Screening) (Mammo) T-Measles (Rubeola) Antibody IgG [id number]) T-Mumps Virus Antibody, IgG [id number]) T-Hepatitis B Core Antibody [id number]) TB Skin Test [id number]) Admin 1st Vaccine [id number]) Tdap => 35yrs IM [id number]) Admin of Any Addtl Vaccine [id number])  T-Rubella Antibody [id number])  Patient Instructions: 1)  Please avoid drinking alcohol while taking antibiotics. First take metronidasole. 2)  Once it is finished, sstart taking Fluconazole. 3)  Please, follow up in 7 days if noimprovement. Prescriptions: METRONIDAZOLE 500 MG TABS (METRONIDAZOLE) Take 1 tablet by mouth two times a day for 7 days  #14 x 0   Entered and Authorized by:   Milana Obey MD   Signed by:   Milana Obey MD on 05/07/2010   Method used:   Electronically to        [facility] [phone]* (retail)       [place], [place]  [address]       Ph: [phone] or [phone]       Fax: [phone]   RxID:   [id number] FLUCONAZOLE 150 MG TABS (FLUCONAZOLE) Take one tablet by mouth every other day x 3  #3 x 0   Entered and Authorized by:   Milana Obey MD   Signed by:   Milana Obey MD on 05/07/2010   Method used:   Electronically to        [facility] [phone]* (retail)       [place], [place]  [address]       Ph: [phone] or [phone]       Fax: [phone]   RxID:   [id number]  Process Orders Check Orders Results:     [facility]: ABN not required for this insurance Order queued for requisitioning for [facility]: May 07, 2010 3:56 PM  Tests Sent for requisitioning (May 07, 2010 4:23 PM):     05/07/2010: [facility] -- T-Measles (Rubeola) Antibody IgG [phone] (signed)     05/07/2010: [facility] -- T-Mumps Virus Antibody, IgG [id number] (signed)     05/07/2010: [facility] -- T-Hepatitis B Core Antibody [id number] (signed)     05/07/2010: [facility] -- Ludger Nutting Antibody [id number] (signed)     Prevention & Chronic Care Immunizations   Influenza vaccine: refuses  (07/07/2008)   Influenza vaccine due: 05/31/2010    Tetanus booster: 05/07/2010: Tdap   Tetanus booster due: 05/07/2020    Pneumococcal vaccine: Not documented  Other Screening   Pap smear: **GLANDULAR CELL ABNORMALITY: Atypical glandular cells, see  (06/21/2009)   Pap smear action/deferral: Ordered  (06/21/2009)    Mammogram:  Assessment: BIRADS 1. Location: [place].     (04/01/2007)   Mammogram action/deferral: Ordered  (05/07/2010)   Mammogram due: 03/31/2009   Smoking status: quit  (05/07/2010)  Diabetes Mellitus   HgbA1C: 7.0  (04/23/2010)   Hemoglobin A1C due: 11/07/2010    Eye exam: Results: Normal. Location:[facility] Dr[facility] May[facility] Knife  (11/19/2007)   Diabetic eye exam action/deferral: Ophthalmology referral  (05/07/2010)   Eye exam due: 11/2008    Foot exam: yes  (05/07/2010)   High risk foot: No  (06/13/2008)   Foot care education: Not documented   Foot exam due: 08/07/2010    Urine microalbumin/creatinine ratio: 135.4  (11/13/2009)   Urine microalbumin/cr due: 11/13/2010    Diabetes  flowsheet reviewed?: Yes   Progress toward A1C goal: Unchanged  Lipids   Total Cholesterol: 174  (11/13/2009)   LDL: 113  (11/13/2009)   LDL Direct: Not documented  HDL: 46  (11/13/2009)   Triglycerides: 73  (11/13/2009)   Lipid panel due: 11/07/2010    SGOT (AST): 15  (04/23/2010)   SGPT (ALT): 11  (04/23/2010)   Alkaline phosphatase: 99  (04/23/2010)   Total bilirubin: 1.2  (04/23/2010)   Liver panel due: 11/07/2010    Lipid flowsheet reviewed?: Yes   Progress toward LDL goal: At goal    Stage of readiness to change (lipid management): Maintenance  Self-Management Support :   Personal Goals (by the next clinic visit) :     Personal A1C goal: 7  (02/08/2010)     Personal blood pressure goal: 130/80  (02/08/2010)     Personal LDL goal: 100  (02/08/2010)    Patient will work on the following items until the next clinic visit to reach self-care goals:     Medications and monitoring: take my medicines every day, check my blood sugar, examine my feet every day  (05/07/2010)     Eating: drink diet soda or water instead of juice or soda, eat more vegetables, use fresh or frozen vegetables, eat foods that are low in salt, eat baked foods instead of fried foods, eat fruit for snacks and desserts, limit or avoid alcohol  (05/07/2010)     Activity: take a 30 minute walk every day  (11/13/2009)    Diabetes self-management support: Resources for patients handout  (05/07/2010)   Last medical nutrition therapy: 07/07/2008    Lipid self-management support: Resources for patients handout  (05/07/2010)        Resource handout printed.   Nursing Instructions: Give tetanus booster today Schedule screening mammogram (see order) Refer for screening diabetic eye exam (see order) MMR titer TDap Heb B (1st dose)     Immunizations Administered:  PPD Skin Test:    Vaccine Type: PPD    Site: left forearm    Mfr: Sanofi Pasteur    Dose: 0.1 ml    Route: ID    Given by: Sander Nephew  RN    Exp. Date: 08/02/2011    Lot #: [id number]  Tetanus Vaccine:    Vaccine Type: Tdap    Site: left deltoid    Mfr: GlaxoSmithKline    Dose: 0.5 ml    Route: IM    Given by: Sander Nephew RN    Exp. Date: 03/29/2012    Lot #: [id number]    VIS given: 08/18/07 version given May 07, 2010.    Vital Signs:  Patient profile:   50 year old female Height:      63 inches (160.02 cm) Weight:      174.4 pounds (79.27 kg) BMI:     31.01 Temp:     98.8 degrees F (37.11 degrees C) oral Pulse rate:   87 / minute BP sitting:   126 / 69  (right arm) Cuff size:   regular  Vitals Entered By: Lucky Rathke NT II (May 07, 2010 2:22 PM)   Process Orders Check Orders Results:     [facility]: ABN not required for this insurance Order queued for requisitioning for [facility]: May 07, 2010 3:56 PM  Tests Sent for requisitioning (May 07, 2010 4:23 PM):     05/07/2010: [facility] -- T-Measles (Rubeola) Antibody IgG [phone] (signed)     05/07/2010: [facility] -- T-Mumps Virus Antibody, IgG [id number] (signed)     05/07/2010: [facility] -- T-Hepatitis B Core Antibody [id number] (signed)     05/07/2010:  [facility] -- T-Rubella Antibody [id number] (signed)    Laboratory Results   Urine Tests  Date/Time Received: May 07, 2010 4:04 PM Date/Time Reported: Maryan Rued  May 07, 2010 4:04 PM     Urine HCG: negative    Appended Document: vaginal itching/gg    Laboratory Results   Urine Tests  Date/Time Received: 08/08/011     14:43 Date/Time Reported: 08/08/011     14:46  Routine Urinalysis   Color: yellow Appearance: Clear Glucose: negative   (Normal Range: Negative) Bilirubin: negative   (Normal Range: Negative) Ketone: negative   (Normal Range: Negative) Spec. Gravity: <1.005   (Normal Range: 1.003-1.035) Blood: negative   (Normal Range: Negative) pH: 5.5    (Normal Range: 5.0-8.0) Protein: negative   (Normal Range: Negative) Urobilinogen: 0.2   (Normal Range: 0-1) Nitrite: negative   (Normal Range: Negative) Leukocyte Esterace: trace   (Normal Range: Negative)        Appended Document: vaginal itching/gg TB skin test read and was NEGATIVE

## 2010-10-30 NOTE — Letter (Signed)
Summary: Generic Letter  [facility]  [address]   [place], [place] [address]   Phone: [phone]  Fax: [phone]    [phone]  Immunization Certification  PATIENT: [facility]   Ms Updegraff has the following as of 05/23/10:  Varicella Antibody Titer: Positive 05/09/10 Measles titer:positive 05/09/10 Mumps titer: positive 05/09/10 Rubella Titers: Positive 05/09/10 Completion of Hep B Vaccination series. Booster given 05/23/10. 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  Sincerely,   Rhea Pink  DO

## 2010-10-30 NOTE — Consult Note (Signed)
Summary: [facility]:Dr. Aplington  [facility]:Dr. Aplington   Imported By: Bonner Puna 05/07/2007 10:43:00  _____________________________________________________________________  External Attachment:    Type:   Image     Comment:   External Document

## 2010-10-30 NOTE — Progress Notes (Signed)
Summary: phone/gg  Phone Note Call from Patient   Caller: Patient Summary of Call: pt called with c/o vaginal itching and small amount of white d/c.  She has tried [employer] without relief.   Appointment given for monday for evaluation. Initial call taken by: Gevena Cotton RN,  May 04, 2010 2:11 PM

## 2010-10-30 NOTE — Progress Notes (Signed)
Summary: Refill/gh  Phone Note Refill Request Message from:  Pharmacy on June 15, 2008 2:35 PM  Refills Requested: Medication #1:  HUMALOG MIX 75/25 PEN 75-25 %  SUSP Take 65 Units two times a day Pt would like # 30 instead of 15.    Initial call taken by: Sander Nephew RN,  June 15, 2008 2:36 PM  Follow-up for Phone Call        e-script sent      Prescriptions: HUMALOG MIX 75/25 PEN 75-25 %  SUSP (INSULIN LISP PROT & LISP (HUM)) Take 65 Units two times a day  #1 bottle x 3   Entered and Authorized by:   Thomes Lolling MD   Signed by:   Thomes Lolling MD on 06/15/2008   Method used:   Electronically to        [facility] [phone]* (retail)       [place], [place]  [address]       Ph: [phone] or [phone]       Fax: [phone]   RxID:   [id number]

## 2010-10-30 NOTE — Assessment & Plan Note (Signed)
Summary: EST-ROUTINE CHECKUP/CH   Vital Signs:  Patient profile:   50 year old female Height:      63 inches (160.02 cm) Weight:      171.9 pounds (78.14 kg) BMI:     30.56 Temp:     97.8 degrees F (36.56 degrees C) oral Pulse rate:   72 / minute BP sitting:   127 / 65  (left arm)  Vitals Entered By: Mateo Flow Deborra Medina) (May 24, 2010 11:20 AM) CC: pt c/o bilateral foot pain off/on x 1 yr, wants to know why her lips/gums turning pink, still having vaginal "issues", needs forms completed Is Patient Diabetic? Yes Did you bring your meter with you today? Yes Pain Assessment Patient in pain? yes     Location: feet Intensity: 7 Type: throbbing/tingling Onset of pain  Intermittent x 1 yr Nutritional Status BMI of > 30 = obese  Have you ever been in a relationship where you felt threatened, hurt or afraid?No   Does patient need assistance? Functional Status Self care Ambulation Normal   Primary Care Provider:  Rhea Pink  DO  CC:  pt c/o bilateral foot pain off/on x 1 yr, wants to know why her lips/gums turning pink, still having vaginal "issues", and needs forms completed.  History of Present Illness: Kayla [name] comes in today for a follow-up appointment. She has multiple issues today.  1st major issue: Unresolved labial/vulvar abscess, much worse since her last visit. She did not get her antibiotics filled and was never able to get a GYN appointment. She also refused to go to the ED at [facility] for evaluation. Today we are dealing with crisis managment. Pain is worse, lesion is larger, now has extensive inguinal adenopathy and even some lower pelvic pain. She has pain with sitting and walking, she uses warm compresses several times a day for relief. She has been treated with Fluconazole, Metronidazole, topical clotrimazole, lidocaine/HC topical, and lastly a script was given to her for oral Clindamycin. She did not follow through with teh plan of care at her last appointment  with Dr. Stanford Scotland by going to the ED.  2nd issue: peripheral neuropathy. Stocking glove distribution. pain, numbness and tingling in feet and hands. On large doses of neurontin with moderate pain relief. Tramadol helpful as well.  3rd issue: Profound Hypothyroidism. s/p Thyroid RAI X2. Has fluctuated between hypo and hyper for several years. Autoimmune Thyroiditis.  4th issue: Diabetes- was under very poor control secondary to severe depression. She is doing much better and has gotten her A1C down to 7 at last check. Using Lantus and Novolog, doesnt reliably check her CBGs. Understands nutritional goals. Has refused DM edu.  5th issue: Depression- much better on Wellbutrin. Life issues have also settled down as well.  6th issue: Vaccination records. Requesting information for starting Phlebotomy classses. No records of her immunizations here- titers drawn at her last visit. She needs Hep B booster.  Current Medications (verified): 1)  Neurontin 600 Mg  Tabs (Gabapentin) .... Take Two Tabs By Mouth Twice Daily 2)  Ambien Cr 12.5 Mg Cr-Tabs (Zolpidem Tartrate) .... Take 1 Tablet By Mouth Once A Day 3)  Synthroid 175 Mcg Tabs (Levothyroxine Sodium) .... Take 1 Tablet By Mouth Once A Day in The Morning With An Empty Stomach. 4)  Famotidine 20 Mg Tabs (Famotidine) .... Take 1 Tablet By Mouth Two Times A Day 5)  Pen Needles 31g X 8 Mm Misc (Insulin Pen Needle) .... Use To Inject Insulin 4x Daily Before  Meals and Bedtime 6)  Lantus Solostar 100 Unit/ml Soln (Insulin Glargine) .... Inject 30 Units Every Evening  ~ 9 Pm 7)  Metformin Hcl 1000 Mg Tabs (Metformin Hcl) .... Take 1 Tablet By Mouth Two Times A Day 8)  Glucotrol Xl 10 Mg Xr24h-Tab (Glipizide) .... Take 1 Tablet By Mouth Once A Day 9)  Wellbutrin Sr 150 Mg Xr12h-Tab (Bupropion Hcl) .... Take 1 Tablet By Mouth Two Times A Day 10)  Losartan Potassium 25 Mg Tabs (Losartan Potassium) .... Take 1 Tablet By Mouth Once A Day 11)  Tramadol Hcl 50 Mg  Tabs (Tramadol Hcl) .... Take One Tab By Mouth Every 6 Hours As Needed Pain 12)  Lortab 10-500 Mg Tabs (Hydrocodone-Acetaminophen) .... Take 1 Tablet By Mouth Once A Day At Bedtime 13)  Novolog Flexpen 100 Unit/ml Soln (Insulin Aspart) .... Inject 3-4 Units 15 Minutes Prior To Eating Each Meal and At Bedtime As Directed 14)  Clotrimazole 1 % Crea (Clotrimazole) .... Apply To Rash and Intravaginall At Bedtime For 7 Days 15)  Clindamycin Hcl 150 Mg Caps (Clindamycin Hcl) .... Take 1 Tablet By Mouth Two Times A Day For 10 Days With Meals 16)  Lidocaine-Hydrocortisone Ace 3-0.5 % Crea (Lidocaine-Hydrocortisone Ace) .... Apply To Vaginal Labia Q 6 Hrs As Needed For Itching 17)  Fluconazole 150 Mg Tabs (Fluconazole) .... Take 1 Tablet By Mouth Once A Dayx 1 18)  Bactrim Ds 800-160 Mg Tabs (Sulfamethoxazole-Trimethoprim) .... Take 1 Tablet By Mouth Two Times A Day 19)  Hydrocodone-Acetaminophen 10-660 Mg Tabs (Hydrocodone-Acetaminophen) .... Take 1 Tablet By Mouth Every 6 Hours As Needed Pain  Allergies (verified): 1)  ! 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 Current Student in Phlebotomy. Mother of 2 sons. No ETOH No illicit drugs No remote substance abuse  Review of Systems      See HPI  Physical Exam  General:  Well-developed,well-nourished,in no acute distress; alert,appropriate and cooperative throughout examination; overweight-appearing.   Neck:  Supple, no masses, thyroid soft, ?mildly enlarged Chest Wall:  no tenderness and no mass.   Lungs:  normal respiratory effort and normal breath sounds.   Heart:  normal rate, regular rhythm, and no murmur.   Abdomen:  soft, non-tender, no distention, and no masses.   Genitalia:  There is a very large, tense abscess on the upper portion of her right labia. Very tender, guarding. Central area of purulent material entraped and no active drainage, loculated appearing.  Excoriations along perineum and skin folds, vaginal opening is inflammed, no obvious discharge. Msk:  no joint swelling and no joint deformities.   Pulses:  R and L carotid,radial,femoral,dorsalis pedis and posterior tibial pulses are full and equal bilaterally Extremities:  No clubbing, cyanosis, edema, or deformity noted with normal full range of motion of all joints.   Neurologic:  alert & oriented X3, strength normal in all extremities, and gait normal.    Slightly diminished sensation to light touch distal phalanges and toes. No specific deratomal distribution and bilateral. Skin:  see GNY Cervical Nodes:  neg Axillary Nodes:  neg Inguinal Nodes:  Bilateral painfully enlarged nodes, warm to touch, no redness   Impression & Recommendations:  Problem # 1:  VULVAR ABSCESS (ICD-616.4) I am very concerned about the status of this untreated vulvar/labial abscess, there is likely deep extension of this lesion into the deeper tissue layers possibly fascia with tracking and purlulent loculations. She may need surgical I&D and for  this to be done under sedation at least given teh high level of her current pain just with palpation. At her last appointment she was advised to go to the ED because community GYN's were not taking new Medicaid patients. She sis not do this and also has not yet started an antibiotic- this has undoubtedly progressed significantly since her last appointment as this is now a very urgent issue. I prepared today to do the I&D to provide her with at least some relief and drainage, but fortunately we were able to get her an appointment tomorrow AM with Dr. Mancel Bale, a local GYN for a complete evaluation. I have stressed to her the importance of starting her antibiotics immediately when she leaves the office today. Will use Bactrim DS for 7 days in addition to Clindamycin previously prescribed. This will cover possible MRSA and Anerobes.   She had negative wet prep and STD testing. She  has never been test for genital herpes. Not sexually active in last 6 months. I think her external excoriations and skin friability are from over use of hot/warm compresses, cleansers and general inflammation being caused by the chronicity of the labial abscess- likely a secondary dermatitis.     Problem # 2:  ANA POSITIVE (ICD-790.99) Likely related and completely explained by her autoimmune thyroid disease. Anti DSDNA neg as well as all other markers for rhuem dx.  Problem # 3:  DIABETES MELLITUS, TYPE II (ICD-250.00) Will check her A1C today and a CBG. Doing much better with insulin dosing. No CBGs records available for review today- multiple acute issues today.  Her updated medication list for this problem includes:    Lantus Solostar 100 Unit/ml Soln (Insulin glargine) ..... Inject 30 units every evening  ~ 9 pm    Metformin Hcl 1000 Mg Tabs (Metformin hcl) .Marland Kitchen... Take 1 tablet by mouth two times a day    Glucotrol Xl 10 Mg Xr24h-tab (Glipizide) .Marland Kitchen... Take 1 tablet by mouth once a day    Losartan Potassium 25 Mg Tabs (Losartan potassium) .Marland Kitchen... Take 1 tablet by mouth once a day    Novolog Flexpen 100 Unit/ml Soln (Insulin aspart) ..... Inject 3-4 units 15 minutes prior to eating each meal and at bedtime as directed  Orders: T-Hgb A1C (in-house) [id number]) T- Capillary Blood Glucose [id number])  Labs Reviewed: Creat: 0.80 (04/23/2010)     Last Eye Exam: Results: Normal. Location:[facility] Dr. Mayford Knife (11/19/2007) Reviewed HgBA1c results: 7.0 (05/24/2010)  7.0 (04/23/2010)  Problem # 4:  HYPOTHYROIDISM (ICD-244.9) Will check TSH today.   Her updated medication list for this problem includes:    Synthroid 175 Mcg Tabs (Levothyroxine sodium) .Marland Kitchen... Take 1 tablet by mouth once a day in the morning with an empty stomach.  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 Will increase neurontin to three times a day dosing. Will need to review PN work-up from her past records.  Problem # 6:  SICKLE-CELL TRAIT (ICD-282.5) She appears to have a chronic baseline anemia. 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 Will check CBC today.  Problem # 7:  Preventive Health Care (ICD-V70.0) Flu shot today. Immunization titers review and school paperwork completed.  Complete Medication List: 1)  Neurontin 600 Mg Tabs (Gabapentin) .... Take two tabs by mouth twice daily 2)  Ambien Cr 12.5 Mg Cr-tabs (Zolpidem tartrate) .... Take 1 tablet by mouth once a day 3)  Synthroid 175 Mcg Tabs (  Levothyroxine sodium) .... Take 1 tablet by mouth once a day in the morning with an empty stomach. 4)  Famotidine 20 Mg Tabs (Famotidine) .... Take 1 tablet by mouth two times a day 5)  Pen Needles 31g X 8 Mm Misc (Insulin pen needle) .... Use to inject insulin 4x daily before meals and bedtime 6)  Lantus Solostar 100 Unit/ml Soln (Insulin glargine) .... Inject 30 units every evening  ~ 9 pm 7)  Metformin Hcl 1000 Mg Tabs (Metformin hcl) .... Take 1 tablet by mouth two times a day 8)  Glucotrol Xl 10 Mg Xr24h-tab (Glipizide) .... Take 1 tablet by mouth once a day 9)  Wellbutrin Sr 150 Mg Xr12h-tab (Bupropion hcl) .... Take 1 tablet by mouth two times a day 10)  Losartan Potassium 25 Mg Tabs (Losartan potassium) .... Take 1 tablet by mouth once a day 11)  Tramadol Hcl 50 Mg Tabs (Tramadol hcl) .... Take one tab by mouth every 6 hours as needed pain 12)  Lortab 10-500 Mg Tabs (Hydrocodone-acetaminophen) .... Take 1 tablet by mouth once a day at bedtime 13)  Novolog Flexpen 100 Unit/ml Soln (Insulin aspart) .... Inject 3-4 units 15 minutes prior to eating each meal and at bedtime as directed 14)  Clotrimazole 1 % Crea (Clotrimazole) .... Apply to rash and intravaginall at bedtime for 7 days 15)  Clindamycin Hcl 150 Mg Caps (Clindamycin hcl) .... Take 1 tablet by mouth two times a day for 10  days with meals 16)  Lidocaine-hydrocortisone Ace 3-0.5 % Crea (Lidocaine-hydrocortisone ace) .... Apply to vaginal labia q 6 hrs as needed for itching 17)  Fluconazole 150 Mg Tabs (Fluconazole) .... Take 1 tablet by mouth once a dayx 1 18)  Bactrim Ds 800-160 Mg Tabs (Sulfamethoxazole-trimethoprim) .... Take 1 tablet by mouth two times a day 19)  Hydrocodone-acetaminophen 10-660 Mg Tabs (Hydrocodone-acetaminophen) .... Take 1 tablet by mouth every 6 hours as needed pain  Other Orders: T-CBC w/Diff [id number]) Influenza Vaccine NON MCR [id number]) Hepatitis B Vaccine >89yrs [id number]) Admin 1st Vaccine [id number])  Patient Instructions: 1)  Keep appointment with OB/GYN tomorrow. 2)  Start antibiotic immediately. 3)  Warm compresses. 4)  Demaplast benzocaine topical spray. Prescriptions: AMBIEN CR 12.5 MG CR-TABS (ZOLPIDEM TARTRATE) Take 1 tablet by mouth once a day  #30 x 3   Entered and Authorized by:   Rhea Pink  DO   Signed by:   Rhea Pink  DO on 05/24/2010   Method used:   Print then Give to Patient   RxID:   [id number] HYDROCODONE-ACETAMINOPHEN 10-660 MG TABS (HYDROCODONE-ACETAMINOPHEN) Take 1 tablet by mouth every 6 hours as needed pain  #60 x 1   Entered and Authorized by:   Rhea Pink  DO   Signed by:   Rhea Pink  DO on 05/24/2010   Method used:   Print then Give to Patient   RxID:   [id number] BACTRIM DS 800-160 MG TABS (SULFAMETHOXAZOLE-TRIMETHOPRIM) Take 1 tablet by mouth two times a day  #14 x 0   Entered and Authorized by:   Rhea Pink  DO   Signed by:   Rhea Pink  DO on 05/24/2010   Method used:   Electronically to        [facility] [phone]* (retail)       [address] [place], [place]  [address]       Ph: [phone] or  [phone]       Fax: [phone]   RxID:   [id number]  Process Orders Check Orders Results:     [facility]: ABN not required for this insurance Tests Sent for  requisitioning (May 24, 2010 11:48 PM):     05/24/2010: [facility] -- T-Comprehensive Metabolic Panel [id number] (signed)     05/24/2010: [facility] -- T-CBC w/Diff [id number] (signed)     05/24/2010: [facility] -- T-TSH [phone] (signed)     Prevention & Chronic Care Immunizations   Influenza vaccine: Fluvax Non-MCR  (05/24/2010)   Influenza vaccine due: 05/31/2010    Tetanus booster: 05/07/2010: Tdap   Tetanus booster due: 05/07/2020    Pneumococcal vaccine: Not documented  Other Screening   Pap smear: **GLANDULAR CELL ABNORMALITY: Atypical glandular cells, see  (06/21/2009)   Pap smear action/deferral: Ordered  (06/21/2009)    Mammogram: ASSESSMENT: Negative - BI-RADS 1^MM DIGITAL SCREENING  (05/16/2010)   Mammogram action/deferral: Ordered  (05/07/2010)   Mammogram due: 03/31/2009   Smoking status: quit  (05/22/2010)  Diabetes Mellitus   HgbA1C: 7.0  (05/24/2010)   Hemoglobin A1C due: 11/07/2010    Eye exam: Results: Normal. Location:[facility] Dr. Mayford Knife  (11/19/2007)   Diabetic eye exam action/deferral: Ophthalmology referral  (05/07/2010)   Eye exam due: 11/2008    Foot exam: yes  (05/07/2010)   High risk foot: No  (06/13/2008)   Foot care education: Not documented   Foot exam due: 08/07/2010    Urine microalbumin/creatinine ratio: 135.4  (11/13/2009)   Urine microalbumin/cr due: 11/13/2010  Lipids   Total Cholesterol: 174  (11/13/2009)   LDL: 113  (11/13/2009)   LDL Direct: Not documented   HDL: 46  (11/13/2009)   Triglycerides: 73  (11/13/2009)   Lipid panel due: 11/07/2010    SGOT (AST): 15  (04/23/2010)   SGPT (ALT): 11  (04/23/2010) CMP ordered    Alkaline phosphatase: 99  (04/23/2010)   Total bilirubin: 1.2  (04/23/2010)   Liver panel due: 11/07/2010  Self-Management Support :   Personal Goals (by the next clinic visit) :     Personal A1C goal: 7   (02/08/2010)     Personal blood pressure goal: 130/80  (02/08/2010)     Personal LDL goal: 100  (02/08/2010)    Patient will work on the following items until the next clinic visit to reach self-care goals:     Medications and monitoring: take my medicines every day  (05/24/2010)     Eating: eat foods that are low in salt, eat baked foods instead of fried foods  (05/24/2010)     Activity: take a 30 minute walk every day  (05/24/2010)    Diabetes self-management support: Written self-care plan  (05/22/2010)   Last medical nutrition therapy: 07/07/2008    Lipid self-management support: , Written self-care plan  (05/22/2010)    Nursing Instructions: Needs Flu shot Need Hep B shot #1 or instructions on where top get it done.   Laboratory Results   Blood Tests   Date/Time Received: May 24, 2010 1:09 PM  Date/Time Reported: Lenoria Farrier  May 24, 2010 1:10 PM   HGBA1C: 7.0%   (Normal Range: Non-Diabetic - 3-6%   Control Diabetic - 6-8%) CBG Fasting:: 116mg /dL       Immunizations Administered:  Influenza Vaccine # 1:    Vaccine Type: Fluvax Non-MCR    Site: right deltoid    Mfr: GlaxoSmithKline    Dose: 0.5 ml  Route: IM    Given by: Mateo Flow ([place])    Exp. Date: 03/30/2011    Lot #: [id number]    VIS given: 04/23/07 version given May 24, 2010.  Hepatitis B Vaccine # 4:    Vaccine Type: HepB Adult    Site: left deltoid    Mfr: Merck    Dose: 1.0 ml    Route: IM    Given by: Mateo Flow ([place])    Exp. Date: 01/28/2012    Lot #: [id number]    VIS given: 04/16/06 version given May 24, 2010.  Flu Vaccine Consent Questions:    Do you have a history of severe allergic reactions to this vaccine? no    Any prior history of allergic reactions to egg and/or gelatin? no    Do you have a sensitivity to the preservative Thimersol? no    Do you have a past history of Guillan-Barre Syndrome? no    Do you currently have an acute febrile illness?  no    Have you ever had a severe reaction to latex? no    Vaccine information given and explained to patient? yes    Are you currently pregnant? no

## 2010-10-30 NOTE — Consult Note (Signed)
Summary: [facility]: Eye Health Exam  [facility]: Eye Health Exam   Imported By: Bonner Puna 02/15/2008 16:32:11  _____________________________________________________________________  External Attachment:    Type:   Image     Comment:   External Document

## 2010-10-30 NOTE — Assessment & Plan Note (Signed)
Summary: NEED MEDICATION/ SB.   Vital Signs:  Patient Profile:   50 Years Old Female Height:     63 inches (160.02 cm) Weight:      215.5 pounds (97.95 kg) BMI:     38.31 Temp:     99.1 degrees F (37.28 degrees C) oral Pulse rate:   100 / minute BP sitting:   114 / 63  (right arm)  Pt. in pain?   yes    Location:   headache    Intensity:   8    Type:       sharp  Vitals Entered By: Nadine Counts Deborra Medina) (December 08, 2007 2:57 PM)              Is Patient Diabetic? Yes  Nutritional Status BMI of > 30 = obese CBG Result 168  Have you ever been in a relationship where you felt threatened, hurt or afraid?No   Does patient need assistance? Functional Status Self care Ambulation Normal     Chief Complaint:  c/o headache for 3 days, med refill, tiredness, numbness in fingers, and toes tingled.  History of Present Illness: Today is December 08, 2007. Kayla [name] was last seen June 2008. 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 The MRI also commented on bursitis and tendinopathy.   She continues to have bilateral numbness in hands and down both legs. Sugars have been running 150-170. She takes 55 units of insulin at night, but will not take more because she says that she feels woozy if she does.  She also complains of 2 weeks of symptomatic bronchitis. She does not feel like her symptoms are improving. She has not smoked since the 1990's.        Prior Medication List:  NEURONTIN 600 MG  TABS (GABAPENTIN) Take one tablet 2-3 times daily AMITRIPTYLINE HCL 25 MG  TABS (AMITRIPTYLINE HCL) Take one tablet at bedtime AMBIEN 10 MG  TABS (ZOLPIDEM TARTRATE) Take 1 tab by mouth at bedtime LEVOTHROID 150 MCG  TABS (LEVOTHYROXINE  SODIUM) Take one tablet daily ACTOS 45 MG  TABS (PIOGLITAZONE HCL) Take 1 tablet by mouth once a day HUMALOG MIX 75/25 PEN 75-25 %  SUSP (INSULIN LISP PROT & LISP (HUM)) Take 40 Units two times a day   Updated Prior Medication List: NEURONTIN 600 MG  TABS (GABAPENTIN) Take one tablet twice daily AMITRIPTYLINE HCL 25 MG  TABS (AMITRIPTYLINE HCL) Take three  tablets at bedtime AMBIEN 10 MG  TABS (ZOLPIDEM TARTRATE) Take 1 tab by mouth at bedtime LEVOTHROID 150 MCG  TABS (LEVOTHYROXINE SODIUM) Take one tablet daily ACTOS 45 MG  TABS (PIOGLITAZONE HCL) Take 1 tablet by mouth once a day HUMALOG MIX 75/25 PEN 75-25 %  SUSP (INSULIN LISP PROT & LISP (HUM)) Take 55 Units two times a day AMBIEN 10 MG  TABS (ZOLPIDEM TARTRATE) Take one pill at bedtime  Current Allergies (reviewed today): ! ACE INHIBITORS  Past Medical History:    Reviewed history from 07/31/2006 and no changes required:       HYPOTHYROIDISM secondary to radiation for Grave's disease 1997       DIABETES MELLITUS, TYPE II        -  Microalbuminuria- M/C 258mg /g 12/06       -  Nl  foot exam 12/06       -  Nutrition education 5/99       -  Eye exam 7/07- per pt report, awaiting records, ?h/o retinopathy?       MULTIPLE SITES ARTHROPATHY       -  L shoulder bursitis (subacromial/ subdeltoid) - MRI 05/16/06          - neg xray 2/00. Irreg contour acromion, otw nl- 4/05          - s/p steroid injection, 2/00          - s/p ortho referral to [place], Dr. 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 Currently on Ambien CR trial       APPENDECTOMY, HX OF        DEPRESSION, since 4/00       -  Elavil 4 wks 4/00- 5/00 then 8/00, pt stopped on own       -  Paxil 5/00- 6/01, swithced to Zoloft 1/03       -  Currently off meds         Past Surgical History:    Reviewed history from 07/31/2006 and no changes required:       Appendectomy, year unknown    Risk Factors: Tobacco use:  quit    Year quit:  1990's Alcohol use:  no Exercise:  yes    Times per week:  3    Type:  walking Seatbelt use:  100 %  Mammogram History:     Date of Last Mammogram:  04/01/2007    Results:   Assessment: BIRADS 1. Location: [facility].        Physical Exam  General:     Well-developed,well-nourished,in no acute distress; alert,appropriate and cooperative throughout examination Lungs:     Normal respiratory effort, chest expands symmetrically. Lungs are clear to auscultation, no crackles or wheezes. Heart:     normal rate, regular rhythm, and no murmur.   Msk:     She has a large right mass on the right shoulder that sits right under the bra strap. It is non-tender and non-erythematous.  Extremities:     No edema     Impression & Recommendations:  Problem # 1:  DIABETES MELLITUS, TYPE II (ICD-250.00) Assessment: Unchanged Her last HgbA1c was 8.5 in June 2008. Today, it is better, but we discussed getting it below 7.0. I think that her neuropathy symptoms are a complication of her Diabetes. I did tell her  that she could take increased doses of Amitriptyline from 50 to 75 mg at bedtime. She was also informed that she could increase her Neurontin to two times a day or three times a day. I will have her return in 3 MONTHS for f/u. I will also have her talk to Barnabas Harries for other means of improving her diabetes. The regimen that she is taking for her DM is a bit unorthodox. She  changed it because she states that she was having low sugars on the two times a day dosing. I did not make any changes to her medicines today. We will need to establish more continuity between visits in order to best adjust her insulin.   Her updated medication list for this problem includes:    Actos 45 Mg Tabs (Pioglitazone hcl) .Marland Kitchen... Take 1 tablet by mouth once a day    Humalog Mix 75/25 Pen 75-25 % Susp (Insulin lisp prot & lisp (hum)) .Marland Kitchen... Take 55 units two times a day  Orders: T-Hgb A1C (in-house) [phone]) T- Capillary Blood Glucose [id number]) Diabetic Clinic Referral (Diabetic)  Labs Reviewed: HgBA1c: 8.1 (12/08/2007)   Creat: 0.74 (03/24/2007)      Problem # 2:  HYPOTHYROIDISM (ICD-244.9) Assessment: Comment Only Check a TSH.  Her updated medication list for this problem includes:    Levothroid 150 Mcg Tabs (Levothyroxine sodium) .Marland Kitchen... Take one tablet daily  Orders: T-TSH [id number])   Problem # 3:  SHOULDER PAIN (ICD-719.41) Assessment: Deteriorated Her right shoulder mass that sits right under her bra strap continues to expand in size per her report. She has seen Dr. Shellia Carwin for a ankle fracture in the past. I will set her up with him again, given the location of her mass so close to the shoulder. Her MRI in 2007, was not helpful, and further imaging does not seem warranted at this time. Given the close proximity to her right shoulder, I suspect that orthopaedics can further evaluate. If not, she may need a general surgery referral.    Problem # 4:  Preventive Health Care (ICD-V70.0) TC:189 (03/24/2007 9:35:00 PM) HDL:51 (03/24/2007 9:35:00 PM) [id number] (03/24/2007 9:35:00 PM) Trig:82 (03/24/2007 9:35:00 PM)  SGOT:15 (03/24/2007 9:35:00 PM) SPGT:14 (03/24/2007 9:35:00 PM)   Mammogram:April 01, 2007 [id number] TO BE DONE Flu: Vax:Did not come during flu season last year   Problem # 5:  BRONCHITIS NOT SPECIFIED AS ACUTE OR CHRONIC (ICD-490) Assessment:  Deteriorated Patient's symptoms have not improved in 2 wks. Unclear if this represents a viral process vs an atypical infection (such as pertussis). Given her health care exposure, the worsening of symptoms over the past 2 wks, I will treat with 6 days of antibiotics. Patient also given albuterol for the bronchospastic component.   Her updated medication list for this problem includes:    Albuterol 90 Mcg/act Aers (Albuterol) ..... One puff every 4 hours as needed    Zithromax Z-pak 250 Mg Tabs (Azithromycin)   Problem # 6:  HYPERLIPIDEMIA (ICD-272.4) Assessment: Comment Only We will have her return for labs since she is not fasting today.  Future Orders: T-Comprehensive Metabolic Panel ([id number]) ... 12/15/2007 T-Lipid Profile [id number]) ... 12/15/2007  Labs Reviewed: Chol: 189 (03/24/2007)   HDL: 51 (03/24/2007)   LDL: 122 (03/24/2007)   TG: 82 (03/24/2007) SGOT: 15 (03/24/2007)   SGPT: 14 (03/24/2007)   Problem # 7:  ANEMIA-NOS (ICD-285.9) Assessment: Comment Only Will check CBC today.   Problem # 8:  INSOMNIA (ICD-780.52) Assessment: Unchanged Her pharmacy stated that she needs  2 prescriptions. They only give her one at a time when 30 tablets are written for. However, she needs a stated that she needs a separate prescription for the medicines that she must pay for on her own. I therefore, gave one prescription for 30 tablets and another for 20 tablets. She is also on Amitriptyline (for neuropathy) at night which will likely help as well. I do not feel comfortable going beyond 75 mg of Amitriptyline at this time while she is on the Ambien. Of note, she is also taking Neurontin during the day, although she states that she has no sleepiness with this medicine.   Her updated medication list for this problem includes:    Ambien 10 Mg Tabs (Zolpidem tartrate) .Marland Kitchen... Take 1 tab by mouth at bedtime    Ambien 10 Mg Tabs (Zolpidem tartrate) .Marland Kitchen... Take one pill at bedtime   Problem #  9:  NEUROPATHY, NOS (ICD-357.9) Assessment: Deteriorated Likely related to her DM. I performed NCS in the past, but the records are not in EMR. I will need to review her paper chart to see if we can have the records scanned into the system. Continue her Neurontin and Amitriptyline as per above. Her Neurontin can be used two times a day and even three times a day if necessary. For now, she was instructed to increase to two times a day, but I gave her the liberty to increase over time, if there is no change in her symptoms. She was also told that she could take 3 Amitriptyline tablets instead of 2 if necessary (50 to 75 mg). Of course, the most important thing is to control her sugars. We talked at length about this.   Complete Medication List: 1)  Neurontin 600 Mg Tabs (Gabapentin) .... Take one tablet twice daily 2)  Amitriptyline Hcl 25 Mg Tabs (Amitriptyline hcl) .... Take three  tablets at bedtime 3)  Ambien 10 Mg Tabs (Zolpidem tartrate) .... Take 1 tab by mouth at bedtime 4)  Levothroid 150 Mcg Tabs (Levothyroxine sodium) .... Take one tablet daily 5)  Actos 45 Mg Tabs (Pioglitazone hcl) .... Take 1 tablet by mouth once a day 6)  Humalog Mix 75/25 Pen 75-25 % Susp (Insulin lisp prot & lisp (hum)) .... Take 55 units two times a day 7)  Ambien 10 Mg Tabs (Zolpidem tartrate) .... Take one pill at bedtime 8)  Albuterol 90 Mcg/act Aers (Albuterol) .... One puff every 4 hours as needed 9)  Zithromax Z-pak 250 Mg Tabs (Azithromycin)  Other Orders: T-CBC No Diff [id number])   Patient Instructions: 1)  Please schedule a follow-up appointment in 3 months.    Prescriptions: AMBIEN 10 MG  TABS (ZOLPIDEM TARTRATE) Take one pill at bedtime  #20 x 1   Entered and Authorized by:   Luane School MD   Signed by:   Luane School MD on 12/08/2007   Method used:   Print then Give to Patient   RxID:   [id number] AMBIEN 10 MG  TABS (ZOLPIDEM TARTRATE) Take 1 tab by mouth at bedtime  #31 x 1    Entered and Authorized by:   Luane School MD   Signed by:   Luane School MD on 12/08/2007   Method used:   Print then Give to Patient   [id number] AMITRIPTYLINE HCL 25 MG  TABS (AMITRIPTYLINE HCL) Take three  tablets at bedtime  #90 x 3   Entered and Authorized by:   Luane School MD  Signed by:   Luane School MD on 12/08/2007   Method used:   Print then Give to Patient   RxID:   [id number] NEURONTIN 600 MG  TABS (GABAPENTIN) Take one tablet twice daily  #62 x 6   Entered and Authorized by:   Luane School MD   Signed by:   Luane School MD on 12/08/2007   Method used:   Print then Give to Patient   RxID:   [id number] ZITHROMAX Z-PAK 250 MG  TABS (AZITHROMYCIN)   #6 x 0   Entered and Authorized by:   Luane School MD   Signed by:   Luane School MD on 12/08/2007   Method used:   Electronically sent to ...       [facility] [id number]       [place], [place]  [address]       Ph: [phone] or [phone]       Fax: [phone]   RxID:   [id number] ALBUTEROL 90 MCG/ACT  AERS (ALBUTEROL) One puff every 4 hours as needed  #1 x 3   Entered and Authorized by:   Luane School MD   Signed by:   Luane School MD on 12/08/2007   Method used:   Electronically sent to ...       [facility] [id number]*       [place], [place]  [address]       Ph: [phone] or [phone]       Fax: [phone]   RxID:   [id number] HUMALOG MIX 75/25 PEN 75-25 %  SUSP (INSULIN LISP PROT & LISP (HUM)) Take 55 Units two times a day  #1 mo supply x 6   Entered and Authorized by:   Luane School MD   Signed by:   Luane School MD on 12/08/2007   Method used:   Electronically sent to ...       [facility] [id number]       [place], [place]  [address]       Ph: [phone] or [phone]       Fax: [phone]   RxID:    [id number] ACTOS 45 MG  TABS (PIOGLITAZONE HCL) Take 1 tablet by mouth once a day  #30 x 6   Entered and Authorized by:   Luane School MD   Signed by:   Luane School MD on 12/08/2007   Method used:   Electronically sent to ...       [facility] [id number]*       [place], [place]  [address]       Ph: [phone] or [phone]       Fax: [phone]   RxID:   [id number] LEVOTHROID 150 MCG  TABS (LEVOTHYROXINE SODIUM) Take one tablet daily  #31 x 6   Entered and Authorized by:   Luane School MD   Signed by:   Luane School MD on 12/08/2007   Method used:   Electronically sent to ...       [facility] [address] [phone]*  [place],   [address]       Ph: [phone] or [phone]       Fax: [phone]   RxID:   [id number]  ]  Last LDL:                                                 122 (03/24/2007 9:35:00 PM)        Diabetic Foot Exam Foot Inspection Is there a history of a foot ulcer?              No Is there a foot ulcer now?              No Can the patient see the bottom of their feet?          Yes Are the shoes appropriate in style and fit?          Yes Is there swelling or an abnormal foot shape?          No Are the toenails long?                No Are the toenails thick?                Yes Are the toenails ingrown?              No Is there heavy callous build-up?              Yes Is there pain in the calf muscle (Intermittent claudication) when walking?    No    10-g (5.07) Semmes-Weinstein Monofilament Test Performed by: CMA (AAMA)          Right Foot          Left Foot Site 1         normal         normal Site 4         normal         normal Site 5         normal         normal Site 6         normal         normal  Laboratory Results   Blood Tests   Date/Time Recieved: December 08, 2007 3:01 PM Date/Time Reported:  ..................................................................Marland KitchenMaryan Rued  December 08, 2007 3:01 PM    HGBA1C: 8.1%   (Normal Range: Non-Diabetic - 3-6%   Control Diabetic - 6-8%) CBG Random: 168       Procedures Next Due Date:    Mammogram: 03/2008   Procedures Next Due Date:    Mammogram: 03/2008  Appended Document: NEED MEDICATION/ SB.    Clinical Lists Changes  Medications: Added new medication of TUSSIONEX PENNKINETIC ER 8-10 MG/5ML  LQCR (CHLORPHENIRAMINE-HYDROCODONE) Take 70mL by mouth every 12 hours as needed for cough (max is 10 mL per day). - Signed Changed medication from ZITHROMAX Z-PAK 250 MG  TABS (AZITHROMYCIN) to ZITHROMAX Z-PAK 250 MG  TABS (AZITHROMYCIN) Take one pill by mouth daily - Signed Rx of TUSSIONEX PENNKINETIC ER 8-10 MG/5ML  LQCR (CHLORPHENIRAMINE-HYDROCODONE) Take 70mL by mouth every 12 hours as needed for cough (max is 10 mL per day).;  #150 mL x 0;  Signed;  Entered by:  Luane School MD;  Authorized by: Luane School MD;  Method used: Telephoned to [facility] [phone]*, [address] [place], [place], [place]  [address], Ph: [phone] or [phone], Fax: [phone] Rx of ZITHROMAX Z-PAK 250 MG  TABS (AZITHROMYCIN) Take one pill by mouth daily;  #6 x 0;  Signed;  Entered by: Luane School MD;  Authorized by: Luane School MD;  Method used: Telephoned to [facility] [address] [phone]*, [address], [place], [place], [place]  [address], Ph: [phone] or [phone], Fax: [phone]    Prescriptions: ZITHROMAX Z-PAK 250 MG  TABS (AZITHROMYCIN) Take one pill by mouth daily  #6 x 0   Entered and Authorized by:   Luane School MD   Signed by:   Luane School MD on 12/14/2007   Method used:   Telephoned to ...       [facility] [id number]*       [place], [place]  [address]       Ph: [phone] or [phone]       Fax: [phone]   RxID:   [id number] [name] ER 8-10 MG/5ML  LQCR (CHLORPHENIRAMINE-HYDROCODONE) Take 23mL by mouth every 12 hours as needed for cough (max is 10 mL per day).  #150 mL x 0   Entered and Authorized by:   Luane School MD   Signed by:   Luane School MD on 12/14/2007   Method used:   Telephoned to ...       [facility] [id number]*       [place], [place]  [address]       Ph: [phone] or [phone]       Fax: [phone]   RxID:   [id number]  Prescriptions for Tussionex ER 8-10 mg/5 ml by mouth every 12 hours as needed fro cough(max of 10 ml per day.  dispense 150 ml with no refills. and Zithromax Z pak 250 mg 1 tablet dail # 6 with no refills called to [facility] at [phone] per order of Dr. Sharlet Salina.  ..................................................................Marland KitchenSander Nephew RN  December 14, 2007 2:38 PM

## 2010-10-30 NOTE — Progress Notes (Signed)
Summary: Prior Authorization-Denial Humalog Flex Pen  Phone Note Outgoing Call   Call placed by: Sander Nephew RN,  March 29, 2010 2:30 PM Call placed to: Insurer Summary of Call: Call for Prior Authorization of Humalog Flexpen.  Denied pt will need to try for 30 days Novalog Flexpen, Novalog Vial or the Humalog vial.  Sander Nephew RN  March 29, 2010 2:32 PM  Initial call taken by: Sander Nephew RN,  March 29, 2010 2:32 PM  Follow-up for Phone Call        Will change to Novolog Follow-up by: Rhea Pink  DO,  April 23, 2010 10:42 AM    New/Updated Medications: NOVOLOG FLEXPEN 100 UNIT/ML SOLN (INSULIN ASPART) Inject 3-4 units 15 minutes prior to eating each meal and at bedtime as directed Prescriptions: NOVOLOG FLEXPEN 100 UNIT/ML SOLN (INSULIN ASPART) Inject 3-4 units 15 minutes prior to eating each meal and at bedtime as directed  #1 mo x 11   Entered and Authorized by:   Rhea Pink  DO   Signed by:   Rhea Pink  DO on 04/23/2010   Method used:   Electronically to        [facility] [phone]* (retail)       [place], [place]  [address]       Ph: [phone] or [phone]       Fax: [phone]   RxID:   [id number]

## 2010-10-30 NOTE — Progress Notes (Signed)
Summary: cough syrup change due to cost/ hla  Phone Note Call from Patient   Summary of Call: pt calls to say ins will not pay for cough syrup ordered, it is $90.00, could you prescribe something cheaper. Initial call taken by: Freddy Finner RN,  August 28, 2010 11:35 AM  Follow-up for Phone Call        addressed at visit Follow-up by: Rhea Pink  DO,  September 02, 2010 10:33 PM

## 2010-10-30 NOTE — Assessment & Plan Note (Signed)
Summary: EST-CK/FU/MEDS/CFB   Vital Signs:  Patient Profile:   50 Years Old Female Height:     63 inches (160.02 cm) Weight:      206.0 pounds (93.64 kg) BMI:     36.62 Temp:     98.0 degrees F (36.67 degrees C) oral Pulse rate:   88 / minute BP sitting:   109 / 75  (right arm) Cuff size:   large  Pt. in pain?   yes    Location:   ankle/hand    Intensity:   6    Type:       aching  Vitals Entered By: Lucky Rathke (March 24, 2007 1:55 PM)              Is Patient Diabetic? Yes  Nutritional Status normal CBG Result 102  Does patient need assistance? Functional Status Self care Ambulation Normal   Chief Complaint:  medication refill/ CBG_A1c done / routine checkup DM / followup on thyroid / patient is fasting.Marland Kitchen  History of Present Illness: Kayla [name] is a 50 yo female with DM, Hypothyroidism, Anemia. She continues to have bilateral numbness in hands and down both legs. Sugars have been running >200-300. Lowest in low 100's. Has been on 50 Units and the 180's. Goes to [facility] in [place]. She is uptodate on her eye exam, and has not had a mammogram in 2 years. She is also past due on her pap smear. She wants to know about losing weight, despite exercising (walking) and eating frequent small meals.   Current Allergies: ! ACE INHIBITORS    Risk Factors:  Tobacco use:  quit    Year quit:  1990's Alcohol use:  no Exercise:  yes    Times per week:  3    Type:  walking Seatbelt use:  100 %    Physical Exam  General:     Well-developed,well-nourished,in no acute distress; alert,appropriate and cooperative throughout examination Mouth:     Oral mucosa and oropharynx without lesions or exudates.  Teeth in good repair. Lungs:     Normal respiratory effort, chest expands symmetrically. Lungs are clear to auscultation, no crackles or wheezes. Heart:     Normal rate and regular rhythm. S1 and S2 normal without gallop, murmur, click, rub or other extra sounds. Extremities:      trace left pedal edema and trace right pedal edema.  She has an ACE bandage on the Right ankle secondary to previous fx.     Impression & Recommendations:  Problem # 1:  DIABETES MELLITUS, TYPE II (ICD-250.00) Increase Insulin to 50 units two times a day. She has sugars in the 200-300 range. She has self-titrate to 50, and states that her sugars are better controlled at this dose. Return in 3 months.  The following medications were removed from the medication list:    Avandia 4 Mg Tabs (Rosiglitazone maleate) .Marland Kitchen... Take one tablet by mouth two times a day  Her updated medication list for this problem includes:    Actos 45 Mg Tabs (Pioglitazone hcl) .Marland Kitchen... Take 1 tablet by mouth once a day    Humalog Mix 75/25 Pen 75-25 % Susp (Insulin lisp prot & lisp (hum)) .Marland Kitchen... Take 40 units two times a day  Orders: T- Capillary Blood Glucose [id number]) T-Hgb A1C (in-house) [id number])  Labs Reviewed: HgBA1c: 8.5 (03/24/2007)   Creat: 0.60 (08/14/2006)      Problem # 2:  HYPOTHYROIDISM (ICD-244.9) Last TSH was low, and her levothyroxine was decreased from 175  to 150 micrograms.Check TSH today. Her updated medication list for this problem includes:    Levothroid 150 Mcg Tabs (Levothyroxine sodium) .Marland Kitchen... Take one tablet daily  Labs Reviewed: TSH: 0.092 (08/14/2006)    HgBA1c: 8.5 (03/24/2007) Chol: 147 (08/14/2006)   HDL: 52 (08/14/2006)   LDL: 85 (08/14/2006)   TG: 49 (08/14/2006)  Orders: T-TSH [id number])   Problem # 3:  ANEMIA-NOS (ICD-285.9) Check her CBC today to followup. It was 11 in August 2007. TSH: 0.092 (08/14/2006)  Orders: T-CBC w/Diff [id number])   Problem # 4:  FRACTURE, ANKLE, RIGHT (ICD-824.8) Assessment: Unchanged History of this since our last visit. Nothing to do today. Obtain records from Ortho.   Problem # 5:  OBESITY NOS (ICD-278.00) Discussed techniques to lose weight. Unclear if insulin is contributing. Continue low calorie diet, frequent small meals,  and exercise. May need to increase exercise intensity to lose more weight. Currently walks daily. Has recent fx, which prevents her from doing more strenuous  exercises.   Medications Added to Medication List This Visit: 1)  Neurontin 600 Mg Tabs (Gabapentin) .... Take one tablet 2-3 times daily 2)  Amitriptyline Hcl 25 Mg Tabs (Amitriptyline hcl) .... Take one tablet at bedtime 3)  Ambien Cr 12.5 Mg Tbcr (Zolpidem tartrate) .... Take one tablet at bedtime 4)  Levothroid 150 Mcg Tabs (Levothyroxine sodium) .... Take one tablet daily 5)  Actos 45 Mg Tabs (Pioglitazone hcl) .... Take 1 tablet by mouth once a day 6)  Humalog Mix 75/25 Pen 75-25 % Susp (Insulin lisp prot & lisp (hum)) .... Take 40 units two times a day  Other Orders: T-Comprehensive Metabolic Panel ([id number]) T-Lipid Profile [id number]) T-Urine Microalbumin w/creat. ratio [id number] / [id number])   Patient Instructions: 1)  Please schedule a follow-up appointment in 3 months. 2)  It is important that you exercise regularly at least 20 minutes 5 times a week. If you develop chest pain, have severe difficulty breathing, or feel very tired , stop exercising immediately and seek medical attention.  3)  For further weight loss, consider a lower calorie diet. Watch the carbohydrates and continue frequent small meals. Your goal is 1 lb a week. In one month, therefore, you should lose about 4-5 lbs.  4)  Check your blood sugars regularly. If your readings are usually above : or below 70 you should contact our office. 5)  It is important that your Diabetic A1c level is checked every 3 months. 6)  See your eye doctor yearly to check for diabetic eye damage. 7)  Check your feet each night for sore areas, calluses or signs of infection.    Prescriptions: HUMALOG MIX 75/25 PEN 75-25 %  SUSP (INSULIN LISP PROT & LISP (HUM)) Take 40 Units two times a day  #1 mo supply x 5   Entered and Authorized by:   Luane School MD   Signed by:    Luane School MD on 03/24/2007   Method used:   Print then Give to Patient   RxID:   [id number] ACTOS 45 MG  TABS (PIOGLITAZONE HCL) Take 1 tablet by mouth once a day  #30 x 5   Entered and Authorized by:   Luane School MD   Signed by:   Luane School MD on 03/24/2007   Method used:   Print then Give to Patient   [id number] LEVOTHROID 150 MCG  TABS (LEVOTHYROXINE SODIUM) Take one tablet daily  #30 x 5   Entered and Authorized by:  Luane School MD   Signed by:   Luane School MD on 03/24/2007   Method used:   Print then Give to Patient   RxID:   [id number] AMBIEN CR 12.5 MG  TBCR (ZOLPIDEM TARTRATE) Take one tablet at bedtime  #30 x 3   Entered and Authorized by:   Luane School MD   Signed by:   Luane School MD on 03/24/2007   Method used:   Print then Give to Patient   [id number] AMITRIPTYLINE HCL 25 MG  TABS (AMITRIPTYLINE HCL) Take one tablet at bedtime  #30 x 5   Entered and Authorized by:   Luane School MD   Signed by:   Luane School MD on 03/24/2007   Method used:   Print then Give to Patient   RxID:   [id number] NEURONTIN 600 MG  TABS (GABAPENTIN) Take one tablet 2-3 times daily  #90 x 5   Entered and Authorized by:   Luane School MD   Signed by:   Luane School MD on 03/24/2007   Method used:   Print then Give to Patient   RxID:   [id number]    Vital Signs:  Patient Profile:   50 Years Old Female Height:     63 inches (160.02 cm) Weight:      206.0 pounds (93.64 kg) BMI:     36.62 Temp:     98.0 degrees F (36.67 degrees C) oral Pulse rate:   88 / minute BP sitting:   109 / 75 Cuff size:   large    Location:   ankle/hand    Intensity:   6    Type:       aching             CBG Result 102     Laboratory Results   Blood Tests   Date/Time Recieved: March 24, 2007 2:12 PM  Date/Time Reported: ..................................................................Marland KitchenMeta Moore  March 24, 2007 2:12  PM   HGBA1C: 8.5%   (Normal Range: Non-Diabetic - 3-6%   Control Diabetic - 6-8%) CBG Random: 102  CBC

## 2010-10-30 NOTE — Progress Notes (Signed)
  Phone Note Outgoing Call   Call placed by: Lucky Rathke NT II,  June 28, 2009 2:11 PM Call placed to: Patient Details for Reason: GYN APPT Summary of Call: SPO[facility] [place], [place] / [place] / OCT 20, 010, @ 2:45 PM.  PATIENT IS AWARE OF THIS APPT/ APPT LETTER MAILED TO THE PATIENT.  LELA STURDIVANT NTII

## 2010-10-30 NOTE — Assessment & Plan Note (Signed)
Summary: C/O foul smelling urine.   Vital Signs:  Patient profile:   50 year old female Height:      63 inches (160.02 cm) Weight:      197.7 pounds (89.86 kg) BMI:     35.15 Temp:     97.4 degrees F (36.33 degrees C) oral Pulse rate:   84 / minute BP sitting:   131 / 79  (right arm)  Vitals Entered By: Hilda Blades Ditzler RN (January 17, 2009 3:37 PM) Is Patient Diabetic? Yes  Pain Assessment Patient in pain? yes     Location: legs Intensity: 5 Nutritional Status BMI of > 30 = obese Nutritional Status Detail appetite ok  Have you ever been in a relationship where you felt threatened, hurt or afraid?denies   Does patient need assistance? Functional Status Self care Ambulation Normal Comments Pt does not want CBG or A1C done today. Past  2 days - smell to urine. Denies burning , back pain, vag discharge or itching. Just finish pd.   History of Present Illness: 50 yo female with hypothyroidism, DM2, HLD, HTN, sickle cell trait came in for regular follow up. She reports feeling constantly tired and she contributes it to stress and her hypothyroidism. She also report foul smelling urine she has noticed since last week. She denies dysuria, hematuria, frequency, or urgency, no fever, no chills, no other systemic concerns. She does not want any antibiotics since she is tired of medications and she reports stopping Lisinopril, pravastatin, spiriva, atrovent. She thinks she is taking too many medicines and they all make her feel worse. She denies depression, no recent weight loss or gain. No recent sicknesses.   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 Take One Tablet Twice Daily 2)  Amitriptyline Hcl 25 Mg  Tabs (Amitriptyline Hcl) .... Take Three  Tablets At Bedtime 3)  Ambien 10 Mg  Tabs (Zolpidem Tartrate) .... Take 1 Tab By Mouth At Bedtime 4)  Synthroid 175 Mcg Tabs (Levothyroxine Sodium) .... Take 1 Tablet By Mouth Once A Day in The Morning With An Empty Stomach. 5)  Actos 45 Mg  Tabs (Pioglitazone Hcl) .... Take 1 Tablet By Mouth Once A Day 6)  Albuterol 90 Mcg/act  Aers (Albuterol) .... One Puff Every 4 Hours As Needed 7)  Pravastatin Sodium 40 Mg  Tabs (Pravastatin Sodium) .... Take 1 Tablet By Mouth Once A Day 8)  Lisinopril 5 Mg Tabs (Lisinopril) .... Take 1  Tablet By Mouth Once A Day 9)  Tussionex Pennkinetic Er 8-10 Mg/8ml Lqcr (Chlorpheniramine-Hydrocodone) .Marland Kitchen.. 1 Tsp Every 12 Hours 10)  Simply Saline 0.9 % Aers (Saline) .... Use Daily As Directed and As Needed 11)  Atrovent 0.06 % Soln (Ipratropium Bromide) .... Use Intra-Nasally Two Times A Day As Directed 12)  Famotidine 20 Mg Tabs (Famotidine) .... Take 1 Tablet By Mouth Two Times A Day 13)  Pen Needles 31g X 8 Mm Misc (Insulin Pen Needle) .... Use To Inject Insulin 4x Daily Before Meals and Bedtime 14)  Humalog Kwikpen 100 Unit/ml Soln (Insulin Lispro (Human)) .... Inject 15 Units Before Breakfast, Lunch and Dinner. Keep Carbohydrate Intake Between  50-60 Grams Each Meal. 15)  Lantus Solostar 100 Unit/ml Soln (Insulin Glargine) .... Inject 40 Units Every Evening  ~ 9 Pm 16)  Prednisone (Pak) 10 Mg Tabs (Prednisone) .... Start At 60mg  Taper Over 6 Days- Pak 17)  Spiriva Handihaler 18 Mcg Caps (Tiotropium Bromide Monohydrate) .... One Puff Daily As Directed  Current Medications (verified): 1)  Neurontin 600 Mg  Tabs (Gabapentin) .... Take One Tablet Twice Daily 2)  Amitriptyline Hcl 25 Mg  Tabs (Amitriptyline Hcl) .... Take Three  Tablets At Bedtime 3)  Ambien 10 Mg  Tabs (Zolpidem Tartrate) .... Take 1 Tab By Mouth At Bedtime 4)  Synthroid 175 Mcg Tabs (Levothyroxine Sodium) .... Take 1 Tablet By Mouth Once A Day in The Morning With An Empty Stomach. 5)  Albuterol 90 Mcg/act  Aers (Albuterol) .... One Puff Every 4 Hours As Needed 6)  Tussionex Pennkinetic Er 8-10 Mg/30ml Lqcr (Chlorpheniramine-Hydrocodone) .Marland Kitchen.. 1 Tsp Every 12 Hours 7)  Simply Saline 0.9 % Aers (Saline) .... Use Daily As Directed and As Needed 8)  Famotidine 20 Mg Tabs (Famotidine) .... Take 1 Tablet By Mouth Two Times A Day 9)  Pen Needles 31g X 8 Mm Misc (Insulin Pen Needle) .... Use To Inject Insulin 4x Daily Before Meals and Bedtime 10)  Humalog Kwikpen 100 Unit/ml Soln (Insulin Lispro (Human)) .... Inject 15 Units Before Breakfast, Lunch and Dinner. Keep Carbohydrate Intake Between 50-60 Grams Each Meal. 11)  Lantus Solostar 100 Unit/ml Soln (Insulin Glargine) .... Inject 50 Units Every Evening  ~ 9 Pm  Allergies: 1)  ! 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    MULTIPLE SITES ARTHROPATHY    -  L shoulder bursitis (subacromial/ subdeltoid) - MRI 05/16/06       - neg xray 2/00. Irreg  contour acromion, otw nl- 4/05       - s/p steroid injection, 2/00       - s/p ortho referral to [place], Dr. Shanna Cisco, 12/00    -  R shoulder pain 6/01 w/ neg RF, ANA, ACE nl, CRP nl       - MRI- supraspinatus/infraspinats tendinopathy- 05/16/06    - Neg R and lower knee 2 view / bilateral knee pain    RIGHT SHOULDER LIPOMA 8/07     ANEMIA-NOS     SICKLE-CELL TRAIT, per pt report testing in 70 or 80's    ANEMIA, MICROCYTIC     CARPAL TUNNEL SYNDROME -  no records    AIRWAY OBSTRUCTION w/ BD response (see cough description below)     PROTEINURIA     COUGH, CHRONIC    -  Neg CXR, 3/04    -  FEV65, FVC73, Ratio 69- Spiro 4/04     INSOMNIA - failed Ambien. 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  Review of Systems  The patient denies fever, weight loss, weight gain, chest pain, syncope,  dyspnea on exertion, peripheral edema, abdominal pain, difficulty walking, depression, unusual weight change, and abnormal bleeding.    Physical Exam  General:  alert, well-developed, well-nourished, and well-hydrated.   Lungs:  normal respiratory effort, no intercostal retractions, no accessory muscle use, normal breath sounds, no crackles, and no wheezes.   Heart:  normal rate, regular rhythm, no murmur, and no JVD.   Abdomen:  soft, non-tender, normal bowel sounds, no distention, no masses, no guarding, and no rigidity.   Pulses:  R and L carotid,radial,femoral,dorsalis pedis and posterior tibial pulses are full and equal bilaterally Extremities:  No clubbing, cyanosis, edema, or deformity noted with normal full range of motion of all joints.   Psych:  Oriented X3, memory intact for recent and remote, normally interactive, good eye contact, not anxious appearing, and not depressed appearing.    Diabetes Management Exam:    Foot Exam (with socks and/or shoes not present):       Sensory-Pinprick/Light touch:          Left medial foot (L-4): normal          Left dorsal foot (L-5): normal          Left lateral foot (S-1): normal          Right medial foot (L-4): normal          Right dorsal foot (L-5): normal          Right lateral foot (S-1): normal       Sensory-Monofilament:          Left foot: normal  Right foot: normal       Inspection:          Left foot: normal          Right foot: normal       Nails:          Left foot: normal          Right foot: normal   Impression & Recommendations:  Problem # 1:  DIABETES MELLITUS, TYPE II (ICD-250.00) Patient reports being told she does not need to take Actos any longer and looking back through the records, I cant's find any statement indicating this so I am not sure where did she get this idea from. In addition the patient has been taking 50 units of Lantus instead of perscribed 40 units once daily. Also instead of taking  Humalog as perscribed she  takes 50 units all at once before her largest meal. Again, none of these were recommendations so I am not sure why is this patient doing so. She seem to have rather good insight into her problem and wants to have her sugar levels better controlled. I have discussed with her the importance of taking her insulin exactly as perscribed since what she is doing is very dangerous and can result in death if she takes too much at one time. She did not bring her monitor with her and reports not checking her sugars regularly for the past few weeks since she has been busy. She also refuses HgBA1C check today. I have made an agreement with her to come back in 2 weeks and until than she should monitor her sugar levels at least 3 times per day. We also agrred that she will take humalog exactly as perscribed with specific attention to proper timing (15 units 15 minutes before meals). On next appointment will check HgBA1C and will review her glucose meter and will adjust the insulin dosing as necessary. She is refusing to take Actos so I told her to leave it as it is for now and will discuss this issue on her next appointment when we review glucose levels.  The following medications were removed from the medication list:    Actos 45 Mg Tabs (Pioglitazone hcl) .Marland Kitchen... Take 1 tablet by mouth once a day Her updated medication list for this problem includes:    Humalog Kwikpen 100 Unit/ml Soln (Insulin lispro (human)) ..... Inject 15 units before breakfast, lunch and dinner. keep carbohydrate intake between 50-60 grams each meal.    Lantus Solostar 100 Unit/ml Soln (Insulin glargine) ..... Inject 50 units every evening  ~ 9 pm  Problem # 2:  PROTEINURIA (ICD-791.0) Refuses to have any labs done today, so will assess the urine  microalb/Cr next time.   Problem # 3:  HYPERLIPIDEMIA (B2193296.4) She stopped taking pravastatin so I am not sure what the FLP status is at this point. Again she refuses any lab  today but has agreed to have it done on her next visit.  The following medications were removed from the medication list:    Pravastatin Sodium 40 Mg Tabs (Pravastatin sodium) .Marland Kitchen... Take 1 tablet by mouth once a day  Labs Reviewed: SGOT: 17 (12/16/2007)   SGPT: 15 (12/16/2007)   HDL:60 (07/07/2008), 56 (12/16/2007)  LDL:96 (07/07/2008), 118 (12/16/2007)  Chol:166 (07/07/2008), 185 (12/16/2007)  Trig:48 (07/07/2008), 57 (12/16/2007)  Problem # 4:  HYPOTHYROIDISM (ICD-244.9) Will check TSH in 2 weeks.  Her updated medication list for this problem includes:    Synthroid 175 Mcg  Tabs (Levothyroxine sodium) .Marland Kitchen... Take 1 tablet by mouth once a day in the morning with an empty stomach.  Labs Reviewed: TSH: 12.641 (06/13/2008)    HgBA1c: 9.2 (09/08/2008) Chol: 166 (07/07/2008)   HDL: 60 (07/07/2008)   LDL: 96 (07/07/2008)   TG: 48 (07/07/2008)  Problem # 5:  ANEMIA-NOS (ICD-285.9) Will check CBC in 2 weeks as well.  Hgb: 11.6 (06/13/2008)   Hct: 36.1 (06/13/2008)   Platelets: 246 (06/13/2008) RBC: 4.56 (06/13/2008)   RDW: 15.7 (06/13/2008)   WBC: 9.6 (06/13/2008) MCV: 79.2 (06/13/2008)   MCHC: 32.1 (06/13/2008) TSH: 12.641 (06/13/2008)  Problem # 6:  INSOMNIA (ICD-780.52) Continue same regimen.  Her updated medication list for this problem includes:    Ambien 10 Mg Tabs (Zolpidem tartrate) .Marland Kitchen... Take 1 tab by mouth at bedtime  Problem # 7:  Preventive Health Care (ICD-V70.0) Due for Pap Smear at this time but refuses it. Has agreed to have it done on her next visit.   Complete Medication List: 1)  Neurontin 600 Mg Tabs (Gabapentin) .... Take one tablet twice daily 2)  Amitriptyline Hcl 25 Mg Tabs (Amitriptyline hcl) .... Take three  tablets at bedtime 3)  Ambien 10 Mg Tabs (Zolpidem tartrate) .... Take 1 tab by mouth at bedtime 4)  Synthroid 175 Mcg Tabs (Levothyroxine sodium) .... Take 1 tablet by mouth once a day in the morning with an empty stomach. 5)  Albuterol 90 Mcg/act Aers  (Albuterol) .... One puff every 4 hours as needed 6)  Tussionex Pennkinetic Er 8-10 Mg/75ml Lqcr (Chlorpheniramine-hydrocodone) .Marland Kitchen.. 1 tsp every 12 hours 7)  Simply Saline 0.9 % Aers (Saline) .... Use daily as directed and as needed 8)  Famotidine 20 Mg Tabs (Famotidine) .... Take 1 tablet by mouth two times a day 9)  Pen Needles 31g X 8 Mm Misc (Insulin pen needle) .... Use to inject insulin 4x daily before meals and bedtime 10)  Humalog Kwikpen 100 Unit/ml Soln (Insulin lispro (human)) .... Inject 15 units before breakfast, lunch and dinner. keep carbohydrate intake between 50-60 grams each meal. 11)  Lantus Solostar 100 Unit/ml Soln (Insulin glargine) .... Inject 50 units every evening  ~ 9 pm  Patient Instructions: 1)  Please schedule a follow-up appointment in 2 - 3 weeks for labs. 2)  CMP prior to visit, ICD-9: 3)  Lipid Panel prior to visit, ICD-9: 4)  TSH prior to visit, ICD-9: 5)  CBC w/ Diff prior to visit, ICD-9: 6)  HbgA1C prior to visit, ICD-9: 7)  Urine Microalbumin prior to visit, ICD-9: 8)  You need to have a Pap Smear to prevent cervical cancer, on next visit.   Laboratory Results   Urine Tests  Date/Time Received: 01/17/09 3:54PM Date/Time Reported: same  Routine Urinalysis   Color: yellow Appearance: Clear Glucose: >=1000   (Normal Range: Negative) Bilirubin: negative   (Normal Range: Negative) Ketone: negative   (Normal Range: Negative) Spec. Gravity: 1.010   (Normal Range: 1.003-1.035) Blood: small   (Normal Range: Negative) pH: 5.5   (Normal Range: 5.0-8.0) Protein: 30   (Normal Range: Negative) Urobilinogen: 0.2   (Normal Range: 0-1) Nitrite: negative   (Normal Range: Negative) Leukocyte Esterace: negative   (Normal Range: Negative)

## 2010-10-30 NOTE — Medication Information (Signed)
Summary: ZOLPIDEM  ZOLPIDEM   Imported By: Garlan Fillers 08/13/2010 14:46:44  _____________________________________________________________________  External Attachment:    Type:   Image     Comment:   External Document

## 2010-10-30 NOTE — Progress Notes (Signed)
Summary: Refill/gh  Phone Note Refill Request Message from:  Fax from Pharmacy on October 27, 2009 3:40 PM  Refills Requested: Medication #1:  AMBIEN 10 MG  TABS Take 1 tab by mouth at bedtime   Last Refilled: 09/14/2009  Method Requested: Electronic Initial call taken by: Sander Nephew RN,  October 27, 2009 3:41 PM  Follow-up for Phone Call        patient needs to be seen prior to refill. Follow-up by: Rhea Pink  DO,  November 02, 2009 2:23 PM  Additional Follow-up for Phone Call Additional follow up Details #1::        Flag to C. [facility]. Additional Follow-up by: Sander Nephew RN,  November 03, 2009 4:46 PM

## 2010-10-30 NOTE — Consult Note (Signed)
Summary: Opthamology Report    Ophthalmology Exam  Procedure date:  11/19/2007  Findings:      Results: Normal. Location:[facility]  Procedures Next Due Date:    Ophthalmology Exam: 11/2008   Ophthalmology Exam  Procedure date:  11/19/2007  Findings:      Results: Normal. Location:[facility] [address]  Procedures Next Due Date:    Ophthalmology Exam: 11/2008

## 2010-10-30 NOTE — Letter (Signed)
Summary: [facility] Dept.: Referral  [facility] Dept.: Referral   Imported By: Bonner Puna 07/06/2009 15:00:14  _____________________________________________________________________  External Attachment:    Type:   Image     Comment:   External Document

## 2010-10-30 NOTE — Progress Notes (Signed)
  Phone Note Call from Patient   Caller: [facility] [place] Call For: [facility] Summary of Call: [facility] CALLED AND WOULD LIKE FOR DR Clare AT MR:[id number]. SENT A FLAG TO DR Hilma Favors. Lela Sturdivant NT II  December 12, 2009 4:43 PM

## 2010-10-30 NOTE — Assessment & Plan Note (Signed)
Summary: 2WK FU/PER DR Alin Chavira/VS   Vital Signs:  Patient Profile:   50 Years Old Female Height:     63 inches (160.02 cm) Weight:      210.0 pounds (95.45 kg) BMI:     37.33 Temp:     97.8 degrees F (36.56 degrees C) oral Pulse rate:   97 / minute BP sitting:   131 / 82  (right arm)  Pt. in pain?   no  Vitals Entered By: Hilda Blades Ditzler RN (July 21, 2008 10:55 AM)              Is Patient Diabetic? Yes Nutritional Status BMI of > 30 = obese Nutritional Status Detail appetite good CBG Result 171  Have you ever been in a relationship where you felt threatened, hurt or afraid?denies   Does patient need assistance? Functional Status Self care Ambulation Normal     Chief Complaint:  FU -  no change with cough. PFT resch 07/26/08 and hands still tingle.Marland Kitchen  History of Present Illness: Neyla comes in today for follow up on her chronic cough. On her last visit, I started her on Combivent, H2 Blocker, Saline nasal flushes -all at an empiric attempt to treat an underlying sourse for her cough such as asthma, GERD, or post-nasal gtt. She is not improved today- essentially no different. See detailed prior note.    Prior Medication List:  NEURONTIN 600 MG  TABS (GABAPENTIN) Take one tablet twice daily AMITRIPTYLINE HCL 25 MG  TABS (AMITRIPTYLINE HCL) Take three  tablets at bedtime AMBIEN 10 MG  TABS (ZOLPIDEM TARTRATE) Take 1 tab by mouth at bedtime SYNTHROID 175 MCG TABS (LEVOTHYROXINE SODIUM) Take 1 tablet by mouth once a day In the morning with an empty stomach. ACTOS 45 MG  TABS (PIOGLITAZONE HCL) Take 1 tablet by mouth once a day ALBUTEROL 90 MCG/ACT  AERS (ALBUTEROL) One puff every 4 hours as needed PRAVASTATIN SODIUM 40 MG  TABS (PRAVASTATIN SODIUM) Take 1 tablet by mouth once a day LISINOPRIL 5 MG TABS (LISINOPRIL) Take 1 tablet by mouth once a day TUSSIONEX PENNKINETIC ER 8-10 MG/5ML LQCR (CHLORPHENIRAMINE-HYDROCODONE) 1 tsp every 12 hours SIMPLY SALINE 0.9 % AERS (SALINE)  use daily as directed and as needed ATROVENT 0.06 % SOLN (IPRATROPIUM BROMIDE) Use Intra-nasally two times a day as directed FAMOTIDINE 20 MG TABS (FAMOTIDINE) Take 1 tablet by mouth two times a day PEN NEEDLES 31G X 8 MM MISC (INSULIN PEN NEEDLE) use to inject insulin 4x daily before meals and bedtime HUMALOG KWIKPEN 100 UNIT/ML SOLN (INSULIN LISPRO (HUMAN)) inject 15 units before breakfast, lunch and dinner. keep carbohydrate intake between 50-60 grams each meal. LANTUS SOLOSTAR 100 UNIT/ML SOLN (INSULIN GLARGINE) inject 40 units every evening  ~ 9 pm [BMN]   Current Allergies (reviewed today): ! ACE INHIBITORS    Risk Factors: Tobacco use:  quit    Year quit:  1990's Alcohol use:  no Exercise:  yes    Times per week:  3    Type:  walking Seatbelt use:  100 %  Mammogram History:    Date of Last Mammogram:  04/01/2007   Review of Systems      See HPI   Physical Exam  General:     Well-developed,well-nourished,in no acute distress; alert,appropriate and cooperative throughout examination Lungs:     Normal respiratory effort, chest expands symmetrically. Lungs are clear to auscultation, no crackles or wheezes. Heart:     Normal rate and regular rhythm. S1 and S2  normal without gallop, murmur, click, rub or other extra sounds. Msk:     normal ROM and no joint deformities.   Extremities:     No edema     Impression & Recommendations:  Problem # 1:  COUGH, CHRONIC (ICD-786.2) Will now do a short course of prednisone - Pred Pak and obtain a high resolution CT of her chest. Otherwise will refer to an Allergy Specialist. 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   Plan: Pred pak taper- education re: blood sugars High res CT of chest ANA, sed rate to start  Await PFT results  Orders:  T-Sed Rate (Automated) [id number]) Janit Pagan ([id number]) T-Basic Metabolic Panel (99991111) CT (CT)  Orders: T-Sed Rate (Automated) [id number]) Janit Pagan [id number]) CT (CT) T-Basic Metabolic Panel (99991111)   Problem # 2:  DIABETES MELLITUS, TYPE II (ICD-250.00) She met with Butch Penny - she thinks she is doing much better. Will recheck A1C in 2 months.  Her updated medication list for this problem includes:    Actos 45 Mg Tabs (Pioglitazone hcl) .Marland Kitchen... Take 1 tablet by mouth once a day    Lisinopril 5 Mg Tabs (Lisinopril) .Marland Kitchen... Take 1 tablet by mouth once a day    Humalog Kwikpen 100 Unit/ml Soln (Insulin lispro (human)) ..... Inject 15 units before breakfast, lunch and dinner. keep carbohydrate intake between 50-60 grams each meal.    Lantus Solostar 100 Unit/ml Soln (Insulin glargine) ..... Inject 40 units every evening  ~ 9 pm  Orders: Capillary Blood Glucose ([id number]) Fingerstick [id number])   Problem # 3:  HYPERLIPIDEMIA (ICD-272.4) Continue statin at current dose. Near goal. LFT's normal.  Her updated medication list for this problem includes:    Pravastatin Sodium 40 Mg Tabs (Pravastatin sodium) .Marland Kitchen... Take 1 tablet by mouth once a day  Labs Reviewed: Chol: 166 (07/07/2008)   HDL: 60 (07/07/2008)   LDL: 96 (07/07/2008)   TG: 48 (07/07/2008) SGOT: 17 (12/16/2007)   SGPT: 15 (12/16/2007)   Complete Medication List: 1)  Neurontin 600 Mg Tabs (Gabapentin) .... Take one tablet twice daily 2)  Amitriptyline Hcl 25 Mg Tabs (Amitriptyline hcl) .... Take three  tablets at bedtime 3)  Ambien 10 Mg Tabs (Zolpidem tartrate) .... Take 1 tab by mouth at bedtime 4)  Synthroid 175 Mcg Tabs (Levothyroxine sodium) .... Take 1 tablet by mouth once a day in the morning with an empty stomach. 5)  Actos 45 Mg Tabs (Pioglitazone hcl) .... Take 1 tablet by mouth once a day 6)  Albuterol 90 Mcg/act Aers (Albuterol) .... One puff every 4 hours as needed 7)  Pravastatin Sodium 40 Mg Tabs  (Pravastatin sodium) .... Take 1 tablet by mouth once a day 8)  Lisinopril 5 Mg Tabs (Lisinopril) .... Take 1 tablet by mouth once a day 9)  Tussionex Pennkinetic Er 8-10 Mg/30ml Lqcr (Chlorpheniramine-hydrocodone) .Marland Kitchen.. 1 tsp every 12 hours 10)  Simply Saline 0.9 % Aers (Saline) .... Use daily as directed and as needed 11)  Atrovent 0.06 % Soln (Ipratropium bromide) .... Use intra-nasally two times a day as directed 12)  Famotidine 20 Mg Tabs (Famotidine) .... Take 1 tablet by mouth two times a day 13)  Pen Needles 31g X 8 Mm Misc (Insulin pen needle) .... Use to inject insulin 4x daily before meals and bedtime 14)  Humalog Kwikpen 100  Unit/ml Soln (Insulin lispro (human)) .... Inject 15 units before breakfast, lunch and dinner. keep carbohydrate intake between 50-60 grams each meal. 15)  Lantus Solostar 100 Unit/ml Soln (Insulin glargine) .... Inject 40 units every evening  ~ 9 pm 16)  Levaquin 500 Mg Tabs (Levofloxacin) .... Take 1 tablet by mouth once a day 17)  Prednisone (pak) 10 Mg Tabs (Prednisone) .... Start at 60mg  taper over 6 days- pak   Patient Instructions: 1)  Please schedule a follow-up appointment in 1-2 months sooner if not better   Prescriptions: PEN NEEDLES 31G X 8 MM MISC (INSULIN PEN NEEDLE) use to inject insulin 4x daily before meals and bedtime  #1 box x 11   Entered and Authorized by:   Rhea Pink  DO   Signed by:   Rhea Pink  DO on 07/21/2008   Method used:   Printed then faxed to ...       [facility] (retail)       [address]       [place], [place]  [address]       Ph: [phone]       Fax: [phone]   RxID:   [id number] PREDNISONE (PAK) 10 MG TABS (PREDNISONE) Start at 60mg  taper over 6 days- pak  #1 pak x 0   Entered and Authorized by:   Rhea Pink  DO   Signed by:   Rhea Pink  DO on 07/21/2008   Method used:   Electronically to        [facility] [phone]* (retail)       [place], [place]  [address]       Ph: [phone] or [phone]       Fax: [phone]   RxID:   [id number] LEVAQUIN 500 MG TABS (LEVOFLOXACIN) Take 1 tablet by mouth once a day  #7 x 0   Entered and Authorized by:   Rhea Pink  DO   Signed by:   Rhea Pink  DO on 07/21/2008   Method used:   Electronically to        [facility] [phone]* (retail)       [place], [place]  [address]       Ph: [phone] or [phone]       Fax: [phone]   RxID:   [id number]  ]

## 2010-10-30 NOTE — Progress Notes (Signed)
Summary: phone note-GYN referral/gp  Phone Note Outgoing Call   Summary of Call: [facility]. clinic was called this am for a GYN referral appt. ordered per Dr. Stanford Scotland; pt. needs to be seen this week.  [facility] cannot see pt. this week; suggested pt. go to [facility] then f/u with them. This was discussed w/the pt. yesterday as an option. [facility] was unable to see pt. this week.  I called pt. and left message (pt.'s request)  to go to [facility] and to call if she has any questions. Initial call taken by: Morrison Old RN,  May 23, 2010 9:06 AM  Follow-up for Phone Call        Provider notified Follow-up by: Milana Obey MD,  May 23, 2010 9:13 AM

## 2010-10-30 NOTE — Progress Notes (Signed)
Summary: Refill/gh  Phone Note Refill Request Message from:  Fax from Pharmacy on June 26, 2009 9:52 AM  Refills Requested: Medication #1:  SYNTHROID 175 MCG TABS Take 1 tablet by mouth once a day In the morning with an empty stomach.   Last Refilled: 05/25/2009  Method Requested: Electronic Initial call taken by: Sander Nephew RN,  June 26, 2009 9:53 AM  Follow-up for Phone Call        RX denied. She needs a visit to check her TSH - last one was 12 in 9/09 Follow-up by: Adrian Prows MD,  June 26, 2009 10:01 AM  Additional Follow-up for Phone Call Additional follow up Details #1::        Pt has an appointment 07/05/2009. Additional Follow-up by: Sander Nephew RN,  June 28, 2009 9:12 AM    Prescriptions: SYNTHROID 175 MCG TABS (LEVOTHYROXINE SODIUM) Take 1 tablet by mouth once a day In the morning with an empty stomach.  #31 x 11   Entered and Authorized by:   Adrian Prows MD   Signed by:   Adrian Prows MD on 06/28/2009   Method used:   Electronically to        [facility] [phone]* (retail)       [address] [place], [place]  [address]       Ph: [phone] or [phone]       Fax: [phone]   RxID:   [id number]

## 2010-10-30 NOTE — Progress Notes (Signed)
Summary: Medication  Phone Note Call from Patient   Caller: Patient Call For: Kayla Pink  DO Summary of Call: Pt called would like to get a liquid cough medication instead of the pills.  Is doing a lot of coughing to the point of throwing up.  The liquid per pt does better. Sander Nephew RN  June 20, 2008 9:50 AM  Initial call taken by: Sander Nephew RN,  June 20, 2008 9:50 AM    New/Updated Medications: [name] ER 8-10 MG/5ML LQCR (CHLORPHENIRAMINE-HYDROCODONE) 1 tsp every 12 hours   Prescriptions: TUSSIONEX PENNKINETIC ER 8-10 MG/5ML LQCR (CHLORPHENIRAMINE-HYDROCODONE) 1 tsp every 12 hours  #4 oz x 1   Entered and Authorized by:   Milta Deiters MD   Signed by:   Milta Deiters MD on 06/22/2008   Method used:   Print then Give to Patient   RxID:   [id number]

## 2010-10-30 NOTE — Assessment & Plan Note (Signed)
Summary: EST-CK/FU/MEDS/CFB   Vital Signs:  Patient profile:   50 year old female Weight:      183.1 pounds (83.23 kg) BMI:     32.55 Temp:     98.3 degrees F (36.83 degrees C) oral BP sitting:   123 / 61  (left arm) Cuff size:   regular  Vitals Entered By: Lucky Rathke NT II (Feb 08, 2010 11:13 AM) CC:  ALL OVER PAIN  / MEDICATION REFILL   Is Patient Diabetic? Yes Did you bring your meter with you today? No Pain Assessment Patient in pain? yes     Location: ALL OVER Intensity: 10+ Type: SHARP/ACHE /TINGLING Onset of pain  Chronic Nutritional Status BMI of > 30 = obese  Have you ever been in a relationship where you felt threatened, hurt or afraid? NO   Does patient need assistance? Functional Status Self care Ambulation Normal   Primary Care Provider:  Rhea Pink  DO  CC:   ALL OVER PAIN  / MEDICATION REFILL  .  History of Present Illness: Kayla [name] comes in today with multiple complaints as follows: Significantly reduced appetite Chronic neuropathic pain in feet and hands Severe fatigue and depression Elevated CBGs    Preventive Screening-Counseling & Management  Alcohol-Tobacco     Alcohol drinks/day: 0     Smoking Status: quit     Year Quit: 1990's  Caffeine-Diet-Exercise     Does Patient Exercise: yes     Type of exercise: walking     Times/week: 3  Current Medications (verified): 1)  Neurontin 600 Mg  Tabs (Gabapentin) .... Take Two Tabs By Mouth Twice Daily 2)  Ambien 10 Mg  Tabs (Zolpidem Tartrate) .... Take 1 Tab By Mouth At Bedtime 3)  Synthroid 175 Mcg Tabs (Levothyroxine Sodium) .... Take 1 Tablet By Mouth Once A Day in The Morning With An Empty Stomach. 4)  Famotidine 20 Mg Tabs (Famotidine) .... Take 1 Tablet By Mouth Two Times A Day 5)  Pen Needles 31g X 8 Mm Misc (Insulin Pen Needle) .... Use To Inject Insulin 4x Daily Before Meals and Bedtime 6)  Lantus Solostar 100 Unit/ml Soln (Insulin Glargine) .... Inject 30 Units Every Evening  ~  9 Pm 7)  Metformin Hcl 1000 Mg Tabs (Metformin Hcl) .... Take 1 Tablet By Mouth Two Times A Day 8)  Glucotrol Xl 10 Mg Xr24h-Tab (Glipizide) .... Take 1 Tablet By Mouth Once A Day 9)  Wellbutrin Sr 150 Mg Xr12h-Tab (Bupropion Hcl) .... Take 1 Tablet By Mouth Two Times A Day 10)  Losartan Potassium 25 Mg Tabs (Losartan Potassium) .... Take 1 Tablet By Mouth Once A Day 11)  Tramadol Hcl 50 Mg Tabs (Tramadol Hcl) .... Take One Tab By Mouth Every 6 Hours As Needed Pain 12)  Lortab 10-500 Mg Tabs (Hydrocodone-Acetaminophen) .... Take 1 Tablet By Mouth Once A Day At Bedtime  Allergies (verified): 1)  ! Ace Inhibitors  Review of Systems      See HPI  Physical Exam  General:  Well-developed,well-nourished,in no acute distress; alert,appropriate and cooperative throughout examination Neck:  No deformities, masses, or tenderness noted. Lungs:  Normal respiratory effort, chest expands symmetrically. Lungs are clear to auscultation, no crackles or wheezes. Heart:  Normal rate and regular rhythm. S1 and S2 normal without gallop, murmur, click, rub or other extra sounds. Abdomen:  soft, non-tender, and normal bowel sounds.   Msk:  No deformity or scoliosis noted of thoracic or lumbar spine.   Extremities:  trace  LE edema Neurologic:  alert & oriented X3, cranial nerves II-XII intact, and strength normal in all extremities.  Hyporeflexive. Skin:  no rashes.   Psych:   depressed affect.     Impression & Recommendations:  Problem # 1:  DIABETES MELLITUS, TYPE II (ICD-250.00) Will check A1C today. Has been out of insulin for a few days. Reduced apetite, but higher than expected CBGs. Severe neuropathic pain in hands and feet.  Her updated medication list for this problem includes:    Lantus Solostar 100 Unit/ml Soln (Insulin glargine) ..... Inject 30 units every evening  ~ 9 pm    Metformin Hcl 1000 Mg Tabs (Metformin hcl) .Marland Kitchen... Take 1 tablet by mouth two times a day    Glucotrol Xl 10 Mg Xr24h-tab  (Glipizide) .Marland Kitchen... Take 1 tablet by mouth once a day    Losartan Potassium 25 Mg Tabs (Losartan potassium) .Marland Kitchen... Take 1 tablet by mouth once a day  Orders: T- Capillary Blood Glucose [id number]) T-Hgb A1C (in-house) [id number]) T-CBC w/Diff [id number]) T-Comprehensive Metabolic Panel ([id number]) T-Vitamin B12 [phone]) T-Antinuclear Antib (ANA) [id number])  Labs Reviewed: Creat: 0.59 (11/13/2009)     Last Eye Exam: Results: Normal. Location: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 Will start Gabapentin for neuropathic pain.  Problem # 3:  HYPOTHYROIDISM (ICD-244.9) Will check thyroid today. Patient reports not taking her thyroid medicine for at [facility] 3 months. Could explain her symptoms.  Her updated medication list for this problem includes:    Synthroid 175 Mcg Tabs (Levothyroxine sodium) .Marland Kitchen... Take 1 tablet by mouth once a day in the morning with an empty stomach.  Problem # 4:  ANEMIA-NOS (ICD-285.9) Will repeat CBC today.  Problem # 5:  HYPERLIPIDEMIA (P102836.4) Recheck lipids in 6 months. Ideally need to get LDL below 100. Will wait to start stain until we get her baseline problems resolved. Labs Reviewed: SGOT: 18 (11/13/2009)   SGPT: 14 (11/13/2009)   HDL:46 (11/13/2009), 60 (07/07/2008)  LDL:113 (11/13/2009), 96 (07/07/2008)  Chol:174 (11/13/2009), 166 (07/07/2008)  Trig:73 (11/13/2009), 48 (07/07/2008)  Problem # 6:  PROTEINURIA (ICD-791.0) Will Start Losartan- cough with ACE in past.  Problem # 7:  CARPAL TUNNEL SYNDROME (ICD-354.0) Recommended hand stretching, occasional NSAIDS and ice. Labs Reviewed: TSH: 12.641 (06/13/2008)   HgBA1c: 6.5 (02/08/2010)  Complete Medication List: 1)  Neurontin 600 Mg Tabs (Gabapentin) .... Take two tabs by mouth twice daily 2)  Ambien 10 Mg Tabs (Zolpidem tartrate) .... Take 1 tab by mouth at  bedtime 3)  Synthroid 175 Mcg Tabs (Levothyroxine sodium) .... Take 1 tablet by mouth once a day in the morning with an empty stomach. 4)  Famotidine 20 Mg Tabs (Famotidine) .... Take 1 tablet by mouth two times a day 5)  Pen Needles 31g X 8 Mm Misc (Insulin pen needle) .... Use to inject insulin 4x daily before meals and bedtime 6)  Lantus Solostar 100 Unit/ml Soln (Insulin glargine) .... Inject 30 units every evening  ~ 9 pm 7)  Metformin Hcl 1000 Mg Tabs (Metformin hcl) .... Take 1 tablet by mouth two times a day 8)  Glucotrol Xl 10 Mg Xr24h-tab (Glipizide) .... Take 1 tablet by mouth once a day 9)  Wellbutrin Sr 150 Mg Xr12h-tab (Bupropion hcl) .... Take 1 tablet by mouth two times a day 10)  Losartan Potassium 25 Mg Tabs (Losartan potassium) .... Take 1 tablet by mouth once a day  11)  Tramadol Hcl 50 Mg Tabs (Tramadol hcl) .... Take one tab by mouth every 6 hours as needed pain 12)  Lortab 10-500 Mg Tabs (Hydrocodone-acetaminophen) .... Take 1 tablet by mouth once a day at bedtime  Other Orders: T-Thyroid Binding Globulin [id number]) T-TSH [id number]) T-T4, Free [id number])  Patient Instructions: 1)  Please schedule a follow-up appointment in 1 month. 2)  Make appointment with Barnabas Harries. Prescriptions: SYNTHROID 175 MCG TABS (LEVOTHYROXINE SODIUM) Take 1 tablet by mouth once a day In the morning with an empty stomach.  #31 x 11   Entered and Authorized by:   Rhea Pink  DO   Signed by:   Rhea Pink  DO on 02/13/2010   Method used:   Electronically to        [facility] [phone]* (retail)       [address] [place], [place]  [address]       Ph: [phone] or [phone]       Fax: [phone]   RxID:   [id number] LORTAB 10-500 MG TABS (HYDROCODONE-ACETAMINOPHEN) Take 1 tablet by mouth once a day at bedtime  #31 x 3   Entered and Authorized by:   Rhea Pink  DO   Signed by:   Rhea Pink  DO on 02/08/2010   Method used:    Print then Give to Patient   RxID:   [id number] TRAMADOL HCL 50 MG TABS (TRAMADOL HCL) Take one tab by mouth every 6 hours as needed pain  #90 x 0   Entered and Authorized by:   Rhea Pink  DO   Signed by:   Rhea Pink  DO on 02/08/2010   Method used:   Print then Give to Patient   RxID:   [id number] AMBIEN 10 MG  TABS (ZOLPIDEM TARTRATE) Take 1 tab by mouth at bedtime  #31 x 5   Entered and Authorized by:   Rhea Pink  DO   Signed by:   Rhea Pink  DO on 02/08/2010   Method used:   Print then Give to Patient   RxID:   [id number] NEURONTIN 600 MG  TABS (GABAPENTIN) Take two tabs by mouth twice daily  #120 x 3   Entered and Authorized by:   Rhea Pink  DO   Signed by:   Rhea Pink  DO on 02/08/2010   Method used:   Electronically to        [facility] [phone]* (retail)       [place], [place]  [address]       Ph: [phone] or [phone]       Fax: [phone]   RxID:   [id number]  Process Orders Check Orders Results:     [facility]: ABN not required for this insurance Tests Sent for requisitioning (March 12, 2010 10:31 AM):     02/08/2010: [facility] -- T-Thyroid Binding Globulin [id number] (signed)     02/08/2010: [facility] -- T-TSH [id number] (signed)     02/08/2010: [facility] -- [place], [place] [[id number]] (signed)     02/08/2010: [facility] -- T-CBC w/Diff [id number] (signed)     02/08/2010: [facility] -- T-Comprehensive Metabolic Panel [id number] (signed)     02/08/2010: [facility] -- T-Vitamin B12 [phone] (signed)  02/08/2010: [facility] -- T-Antinuclear Antib (ANA) [id number] (signed)    Prevention & Chronic Care Immunizations   Influenza vaccine: refuses  (07/07/2008)    Tetanus booster: Not documented    Pneumococcal vaccine:  Not documented  Other Screening   Pap smear: **GLANDULAR CELL ABNORMALITY: Atypical glandular cells, see  (06/21/2009)   Pap smear action/deferral: Ordered  (06/21/2009)    Mammogram:  Assessment: BIRADS 1. Location: [facility].     (04/01/2007)   Mammogram action/deferral: Screening mammogram in 1 year.     (04/01/2007)   Mammogram due: 03/2008   Smoking status: quit  (02/08/2010)  Diabetes Mellitus   HgbA1C: 6.5  (02/08/2010)    Eye exam: Results: Normal. Location:[facility] Knife  (11/19/2007)   Eye exam due: 11/2008    Foot exam: yes  (11/13/2009)   High risk foot: No  (06/13/2008)   Foot care education: Not documented    Urine microalbumin/creatinine ratio: 135.4  (11/13/2009)  Lipids   Total Cholesterol: 174  (11/13/2009)   LDL: 113  (11/13/2009)   LDL Direct: Not documented   HDL: 46  (11/13/2009)   Triglycerides: 73  (11/13/2009)    SGOT (AST): 18  (11/13/2009)   SGPT (ALT): 14  (11/13/2009) CMP ordered    Alkaline phosphatase: 95  (11/13/2009)   Total bilirubin: 0.9  (11/13/2009)  Self-Management Support :   Personal Goals (by the next clinic visit) :     Personal A1C goal: 7  (02/08/2010)     Personal blood pressure goal: 130/80  (02/08/2010)     Personal LDL goal: 100  (02/08/2010)    Patient will work on the following items until the next clinic visit to reach self-care goals:     Medications and monitoring: take my medicines every day, check my blood sugar, examine my feet every day  (02/08/2010)     Eating: drink diet soda or water instead of juice or soda, use fresh or frozen vegetables, eat foods that are low in salt, eat baked foods instead of fried foods, eat fruit for snacks and desserts, limit or avoid alcohol  (02/08/2010)     Activity: take a 30 minute walk every day  (11/13/2009)    Diabetes self-management support: Resources for patients handout  (02/08/2010)   Last medical nutrition therapy:  07/07/2008    Lipid self-management support: Resources for patients handout  (02/08/2010)     Self-management comments: PATIENT UNABLE TO [place] handout printed.  Laboratory Results   Blood Tests   Date/Time Received: Feb 08, 2010 11:36 AM Date/Time Reported: Maryan Rued  Feb 08, 2010 11:36 AM   HGBA1C: 6.5%   (Normal Range: Non-Diabetic - 3-6%   Control Diabetic - 6-8%) CBG Fasting:: 81mg /dL

## 2010-11-01 NOTE — Progress Notes (Signed)
Summary: prior authorization-Ambien ER (changed)//kg  Phone Note From Pharmacy   Caller: [facility] [phone]* Reason for Call: Medication not on formulary Request: Needs authorization from insurer Summary of Call: Received faxed request for prior authorization on pt's ambien  er 12.5mg .  Contacted pharmacy to inquire about med and ambien 10mg  was filled on 09/06/10 with 3 additional refills by Dr Lynnae January.  PA was only needed on the extended release.  No Pa needed at this time, phone call complete. Initial call taken by: Mateo Flow Deborra Medina),  September 27, 2010 1:46 PM

## 2010-11-01 NOTE — Assessment & Plan Note (Signed)
Summary: EST-ROUTINE CHECKUP/CH   Vital Signs:  Patient profile:   50 year old female Height:      63 inches (160.02 cm) Weight:      166.7 pounds (75.77 kg) BMI:     29.64 Temp:     97.7 degrees F (36.50 degrees C) Pulse rate:   63 / minute BP sitting:   128 / 70  (right arm) Cuff size:   regular  Vitals Entered By: Mateo Flow Deborra Medina) (August 30, 2010 2:08 PM) CC: f/u cough, Depression, med refill Is Patient Diabetic? No Pain Assessment Patient in pain? yes     Location: "all over" Intensity: 6 Onset of pain  Chronic CBG Result 64  Does patient need assistance? Functional Status Self care Ambulation Normal   Primary Care Provider:  Rhea Pink  DO  CC:  f/u cough, Depression, and med refill.  History of Present Illness: Kayla [name] comes in for routine f/u on her DM and thyroid issues. doing fairly well- still having difficulty with sleep and neuropathy.  Depression History:      The patient is having a depressed mood most of the day but denies diminished interest in her usual daily activities.        The patient denies that she has thought about ending her life.        Preventive Screening-Counseling & Management  Alcohol-Tobacco     Alcohol drinks/day: 0     Smoking Status: quit     Year Quit: 1990's  Current Medications (verified): 1)  Neurontin 600 Mg  Tabs (Gabapentin) .... Take Two Tabs By Mouth Twice Daily 2)  Ambien Cr 12.5 Mg Cr-Tabs (Zolpidem Tartrate) .... Take 1 Tablet By Mouth Once A Day 3)  Synthroid 175 Mcg Tabs (Levothyroxine Sodium) .... Take 1 Tablet By Mouth Once A Day in The Morning With An Empty Stomach. 4)  Famotidine 20 Mg Tabs (Famotidine) .... Take 1 Tablet By Mouth Two Times A Day 5)  Pen Needles 31g X 8 Mm Misc (Insulin Pen Needle) .... Use To Inject Insulin 4x Daily Before Meals and Bedtime 6)  Lantus Solostar 100 Unit/ml Soln (Insulin Glargine) .... Inject 30 Units Every Evening  ~ 9 Pm 7)  Metformin Hcl 1000 Mg Tabs (Metformin  Hcl) .... Take 1 Tablet By Mouth Two Times A Day 8)  Glucotrol Xl 10 Mg Xr24h-Tab (Glipizide) .... Take 1 Tablet By Mouth Once A Day 9)  Wellbutrin Sr 150 Mg Xr12h-Tab (Bupropion Hcl) .... Take 1 Tablet By Mouth Two Times A Day 10)  Losartan Potassium 25 Mg Tabs (Losartan Potassium) .... Take 1 Tablet By Mouth Once A Day 11)  Tramadol Hcl 50 Mg Tabs (Tramadol Hcl) .... Take One Tab By Mouth Every 6 Hours As Needed Pain 12)  Lortab 10-500 Mg Tabs (Hydrocodone-Acetaminophen) .... Take 1 Tablet By Mouth Once A Day At Bedtime 13)  Novolog Flexpen 100 Unit/ml Soln (Insulin Aspart) .... Inject 3-4 Units 15 Minutes Prior To Eating Each Meal and At Bedtime As Directed 14)  Clotrimazole 1 % Crea (Clotrimazole) .... Apply To Rash and Intravaginall At Bedtime For 7 Days 15)  Clindamycin Hcl 150 Mg Caps (Clindamycin Hcl) .... Take 1 Tablet By Mouth Two Times A Day For 10 Days With Meals 16)  Fluconazole 150 Mg Tabs (Fluconazole) .... Take 1 Tablet By Mouth Once A Dayx 1 17)  Hydrocodone-Acetaminophen 10-660 Mg Tabs (Hydrocodone-Acetaminophen) .... Take 1 Tablet By Mouth Every 6 Hours As Needed Pain 18)  Cheratussin Ac 100-10  Mg/81ml Syrp (Guaifenesin-Codeine) .... Take One Tsp Q6 Hours As Needed For Cough 19)  Benzonatate 200 Mg Caps (Benzonatate) .... Take 1 Tablet By Mouth Two Times A Day As Needed For Cough  Allergies (verified): 1)  ! Ace Inhibitors  Review of Systems      See HPI   Impression & Recommendations:  Problem # 1:  VULVAR ABSCESS (ICD-616.4) Resolved-followed by GYN.  Problem # 2:  DIABETES MELLITUS, TYPE II (ICD-250.00) Much better control. No change to current regimen. 7.0 is a good goal for her right now. Greater than 25 minutes spent in face to face discussion with this patient and in review of the medical record. Greater than 50% of this time was spent in education, counseling and providing instruction on complex medication and medical management issues related to her DM.  Her  updated medication list for this problem includes:    Lantus Solostar 100 Unit/ml Soln (Insulin glargine) ..... Inject 30 units every evening  ~ 9 pm    Metformin Hcl 1000 Mg Tabs (Metformin hcl) .Marland Kitchen... Take 1 tablet by mouth two times a day    Glucotrol Xl 10 Mg Xr24h-tab (Glipizide) .Marland Kitchen... Take 1 tablet by mouth once a day    Losartan Potassium 25 Mg Tabs (Losartan potassium) .Marland Kitchen... Take 1 tablet by mouth once a day    Novolog Flexpen 100 Unit/ml Soln (Insulin aspart) ..... Inject 3-4 units 15 minutes prior to eating each meal and at bedtime as directed  Orders: T- Capillary Blood Glucose [id number]) T-Hgb A1C (in-house) [id number])  Labs Reviewed: Creat: 0.67 (05/24/2010)     Last Eye Exam: Results: Normal. Location:[facility] Kn[facility] (11/19/2007) Reviewed HgBA1c results: 7.1 (08/30/2010)  7.0 (05/24/2010)  Problem # 3:  ANA POSITIVE (ICD-790.99) Due to thyroid disease. Will consider referral to Endocrinology.  Problem # 4:  HYPOTHYROIDISM (ICD-244.9) Needs f/u labs today.  Her updated medication list for this problem includes:    Synthroid 175 Mcg Tabs (Levothyroxine sodium) .Marland Kitchen... Take 1 tablet by mouth once a day in the morning with an empty stomach.  Orders: T-TSH [id number]) T-T4, Free [id number])  Problem # 5:  URI (ICD-465.9) Will treat symptomatically. The following medications were removed from the medication list:    Tussionex Pennkinetic Er 10-8 Mg/60ml Lqcr (Hydrocod polst-chlorphen polst) .Marland Kitchen... Take 1 tsp by mouth at bedtime for cough Her updated medication list for this problem includes:    Cheratussin Ac 100-10 Mg/89ml Syrp (Guaifenesin-codeine) .Marland Kitchen... Take one tsp q6 hours as needed for cough    Benzonatate 200 Mg Caps (Benzonatate) .Marland Kitchen... Take 1 tablet by mouth two times a day as needed for cough  Complete Medication List: 1)  Neurontin 600 Mg Tabs (Gabapentin) .... Take two tabs by mouth twice daily 2)  Ambien 10 Mg Tabs (Zolpidem tartrate)  .... Take one pill by mouth at bedtime as needed for insomnia. 3)  Synthroid 175 Mcg Tabs (Levothyroxine sodium) .... Take 1 tablet by mouth once a day in the morning with an empty stomach. 4)  Famotidine 20 Mg Tabs (Famotidine) .... Take 1 tablet by mouth two times a day 5)  Pen Needles 31g X 8 Mm Misc (Insulin pen needle) .... Use to inject insulin 4x daily before meals and bedtime 6)  Lantus Solostar 100 Unit/ml Soln (Insulin glargine) .... Inject 30 units every evening  ~ 9 pm 7)  Metformin Hcl 1000 Mg Tabs (Metformin hcl) .... Take 1 tablet by mouth two times a day 8)  Glucotrol Xl  10 Mg Xr24h-tab (Glipizide) .... Take 1 tablet by mouth once a day 9)  Wellbutrin Sr 150 Mg Xr12h-tab (Bupropion hcl) .... Take 1 tablet by mouth two times a day 10)  Losartan Potassium 25 Mg Tabs (Losartan potassium) .... Take 1 tablet by mouth once a day 11)  Tramadol Hcl 50 Mg Tabs (Tramadol hcl) .... Take one tab by mouth every 6 hours as needed pain 12)  Lortab 10-500 Mg Tabs (Hydrocodone-acetaminophen) .... Take 1 tablet by mouth once a day at bedtime 13)  Novolog Flexpen 100 Unit/ml Soln (Insulin aspart) .... Inject 3-4 units 15 minutes prior to eating each meal and at bedtime as directed 14)  Clotrimazole 1 % Crea (Clotrimazole) .... Apply to rash and intravaginall at bedtime for 7 days 15)  Clindamycin Hcl 150 Mg Caps (Clindamycin hcl) .... Take 1 tablet by mouth two times a day for 10 days with meals 16)  Fluconazole 150 Mg Tabs (Fluconazole) .... Take 1 tablet by mouth once a dayx 1 17)  Hydrocodone-acetaminophen 10-660 Mg Tabs (Hydrocodone-acetaminophen) .... Take 1 tablet by mouth every 6 hours as needed pain 18)  Cheratussin Ac 100-10 Mg/50ml Syrp (Guaifenesin-codeine) .... Take one tsp q6 hours as needed for cough 19)  Benzonatate 200 Mg Caps (Benzonatate) .... Take 1 tablet by mouth two times a day as needed for cough  Other Orders: T-CBC w/Diff [id number])   Patient Instructions: 1)  Please  schedule a follow-up appointment in 3 months. Prescriptions: AMBIEN CR 12.5 MG CR-TABS (ZOLPIDEM TARTRATE) Take 1 tablet by mouth once a day  #30 x 3   Entered and Authorized by:   Rhea Pink  DO   Signed by:   Rhea Pink  DO on 08/30/2010   Method used:   Print then Give to Patient   RxID:   [id number] BENZONATATE 200 MG CAPS (BENZONATATE) Take 1 tablet by mouth two times a day as needed for cough  #30 x 0   Entered and Authorized by:   Rhea Pink  DO   Signed by:   Rhea Pink  DO on 08/30/2010   Method used:   Print then Give to Patient   RxID:   [id number] CHERATUSSIN AC 100-10 MG/5ML SYRP (GUAIFENESIN-CODEINE) Take one tsp q6 hours as needed for cough  #230ml x 0   Entered and Authorized by:   Rhea Pink  DO   Signed by:   Rhea Pink  DO on 08/30/2010   Method used:   Print then Give to Patient   RxID:   [id number]    Orders Added: 1)  T- Capillary Blood Glucose [[id number]] 2)  T-Hgb A1C (in-house) [[id number]] 3)  T-CBC w/Diff [id number] 4)  T-TSH [id number] 5)  T-T4, Free [id number] 6)  Est. Patient Level IV [id number]   Process Orders Check Orders Results:     [facility]: [id number] not required for this insurance Tests Sent for requisitioning (October 11, 2010 10:28 PM):     08/30/2010: [facility] -- T-CBC w/Diff [id number] (signed)     08/30/2010: [facility] -- T-TSH [phone] (signed)     08/30/2010: [facility] -- T-T4, [place] [[id number]] (signed)     Process Orders Check Orders Results:     [facility]: ABN not required for this insurance Tests Sent for requisitioning (October 11, 2010 10:28 PM):     08/30/2010: [facility] -- T-CBC w/Diff [id number] (signed)     08/30/2010: [facility] -- T-TSH [phone] (signed)  08/30/2010: [facility] -- [place], [place] [[id number]] (signed)    Laboratory  Results   Blood Tests   Date/Time Received: August 30, 2010 2:46 PM Date/Time Reported: Lenoria Farrier  August 30, 2010 2:46 PM   HGBA1C: 7.1%   (Normal Range: Non-Diabetic - 3-6%   Control Diabetic - 6-8%) CBG Random:: 64mg /dL  Comments: CBG repeated for verification. 2nd collection result of 71.  Reported to [place]  by Maryan Rued, PBT @ [facility]  August 30, 2010 2:49 PM

## 2010-12-11 LAB — GLUCOSE, CAPILLARY
Glucose-Capillary: 64 mg/dL — ABNORMAL LOW (ref 70–99)
Glucose-Capillary: 71 mg/dL (ref 70–99)

## 2010-12-14 LAB — GLUCOSE, CAPILLARY
Glucose-Capillary: 116 mg/dL — ABNORMAL HIGH (ref 70–99)
Glucose-Capillary: 176 mg/dL — ABNORMAL HIGH (ref 70–99)

## 2010-12-17 LAB — CBC
HCT: 24.2 % — ABNORMAL LOW (ref 36.0–46.0)
Hemoglobin: 8.1 g/dL — ABNORMAL LOW (ref 12.0–15.0)
MCHC: 33.3 g/dL (ref 30.0–36.0)
MCV: 75.2 fL — ABNORMAL LOW (ref 78.0–100.0)
Platelets: 298 10*3/uL (ref 150–400)
RBC: 3.22 MIL/uL — ABNORMAL LOW (ref 3.87–5.11)
RDW: 20.8 % — ABNORMAL HIGH (ref 11.5–15.5)
WBC: 18.1 10*3/uL — ABNORMAL HIGH (ref 4.0–10.5)

## 2010-12-17 LAB — POCT I-STAT 3, VENOUS BLOOD GAS (G3P V)
Acid-base deficit: 3 mmol/L — ABNORMAL HIGH (ref 0.0–2.0)
Bicarbonate: 22.8 mEq/L (ref 20.0–24.0)
O2 Saturation: 28 %
TCO2: 24 mmol/L (ref 0–100)
pCO2, Ven: 40.7 mmHg — ABNORMAL LOW (ref 45.0–50.0)
pH, Ven: 7.357 — ABNORMAL HIGH (ref 7.250–7.300)
pO2, Ven: 19 mmHg — CL (ref 30.0–45.0)

## 2010-12-17 LAB — DIFFERENTIAL
Band Neutrophils: 0 % (ref 0–10)
Basophils Absolute: 0 10*3/uL (ref 0.0–0.1)
Basophils Relative: 0 % (ref 0–1)
Blasts: 0 %
Eosinophils Absolute: 0 10*3/uL (ref 0.0–0.7)
Eosinophils Relative: 0 % (ref 0–5)
Lymphocytes Relative: 10 % — ABNORMAL LOW (ref 12–46)
Lymphs Abs: 1.8 10*3/uL (ref 0.7–4.0)
Metamyelocytes Relative: 0 %
Monocytes Absolute: 1.8 10*3/uL — ABNORMAL HIGH (ref 0.1–1.0)
Monocytes Relative: 10 % (ref 3–12)
Myelocytes: 0 %
Neutro Abs: 14.5 10*3/uL — ABNORMAL HIGH (ref 1.7–7.7)
Neutrophils Relative %: 80 % — ABNORMAL HIGH (ref 43–77)
Promyelocytes Absolute: 0 %
nRBC: 0 /100 WBC

## 2010-12-17 LAB — BASIC METABOLIC PANEL
BUN: 13 mg/dL (ref 6–23)
CO2: 20 mEq/L (ref 19–32)
Calcium: 10.5 mg/dL (ref 8.4–10.5)
Chloride: 103 mEq/L (ref 96–112)
Creatinine, Ser: 1.09 mg/dL (ref 0.4–1.2)
GFR calc Af Amer: >60 mL/min (ref >60)
GFR calc non Af Amer: 54 mL/min — ABNORMAL LOW (ref >60)
Glucose, Bld: 335 mg/dL — ABNORMAL HIGH (ref 70–99)
Potassium: 3.6 mEq/L (ref 3.5–5.1)
Sodium: 135 mEq/L (ref 135–145)

## 2010-12-17 LAB — GLUCOSE, CAPILLARY
Glucose-Capillary: 291 mg/dL — ABNORMAL HIGH (ref 70–99)
Glucose-Capillary: 322 mg/dL — ABNORMAL HIGH (ref 70–99)
Glucose-Capillary: 333 mg/dL — ABNORMAL HIGH (ref 70–99)
Glucose-Capillary: 336 mg/dL — ABNORMAL HIGH (ref 70–99)
Glucose-Capillary: 392 mg/dL — ABNORMAL HIGH (ref 70–99)

## 2010-12-17 LAB — POCT I-STAT, CHEM 8
BUN: 13 mg/dL (ref 6–23)
Calcium, Ion: 1.4 mmol/L — ABNORMAL HIGH (ref 1.12–1.32)
Chloride: 104 mEq/L (ref 96–112)
Creatinine, Ser: 0.9 mg/dL (ref 0.4–1.2)
Glucose, Bld: 407 mg/dL — ABNORMAL HIGH (ref 70–99)
HCT: 28 % — ABNORMAL LOW (ref 36.0–46.0)
Hemoglobin: 9.5 g/dL — ABNORMAL LOW (ref 12.0–15.0)
Potassium: 4 mEq/L (ref 3.5–5.1)
Sodium: 132 mEq/L — ABNORMAL LOW (ref 135–145)
TCO2: 20 mmol/L (ref 0–100)

## 2010-12-17 LAB — POCT URINALYSIS DIP (DEVICE)
Bilirubin Urine: NEGATIVE
Glucose, UA: >=1000 mg/dL — AB
Ketones, ur: 80 mg/dL — AB
Nitrite: POSITIVE — AB
Protein, ur: 30 mg/dL — AB
Specific Gravity, Urine: 1.01 (ref 1.005–1.030)
Urobilinogen, UA: 0.2 mg/dL (ref 0.0–1.0)
pH: 6 (ref 5.0–8.0)

## 2010-12-17 LAB — POCT PREGNANCY, URINE: Preg Test, Ur: NEGATIVE

## 2010-12-18 LAB — GLUCOSE, CAPILLARY: Glucose-Capillary: 81 mg/dL (ref 70–99)

## 2010-12-19 LAB — GLUCOSE, CAPILLARY: Glucose-Capillary: 222 mg/dL — ABNORMAL HIGH (ref 70–99)

## 2011-01-03 LAB — POCT PREGNANCY, URINE: Preg Test, Ur: NEGATIVE

## 2011-01-03 LAB — GLUCOSE, CAPILLARY: Glucose-Capillary: 170 mg/dL — ABNORMAL HIGH (ref 70–99)

## 2011-01-04 ENCOUNTER — Other Ambulatory Visit: Payer: Self-pay | Admitting: Internal Medicine

## 2011-01-04 LAB — GLUCOSE, CAPILLARY: Glucose-Capillary: 307 mg/dL — ABNORMAL HIGH (ref 70–99)

## 2011-01-10 ENCOUNTER — Ambulatory Visit: Payer: Self-pay | Admitting: Internal Medicine

## 2011-01-14 ENCOUNTER — Ambulatory Visit (INDEPENDENT_AMBULATORY_CARE_PROVIDER_SITE_OTHER): Payer: Medicaid Other | Admitting: Internal Medicine

## 2011-01-14 ENCOUNTER — Ambulatory Visit: Payer: Self-pay | Admitting: Internal Medicine

## 2011-01-14 ENCOUNTER — Other Ambulatory Visit (HOSPITAL_COMMUNITY)
Admission: RE | Admit: 2011-01-14 | Discharge: 2011-01-14 | Disposition: A | Discharge status: Home / Self Care | Payer: Medicaid Other | Source: Ambulatory Visit | Attending: Internal Medicine | Admitting: Internal Medicine

## 2011-01-14 ENCOUNTER — Telehealth: Payer: Self-pay | Admitting: *Deleted

## 2011-01-14 VITALS — BP 141/72 | HR 79 | Temp 98.9°F | Wt 166.5 lb

## 2011-01-14 DIAGNOSIS — Z124 Encounter for screening for malignant neoplasm of cervix: Secondary | ICD-10-CM

## 2011-01-14 DIAGNOSIS — N390 Urinary tract infection, site not specified: Secondary | ICD-10-CM

## 2011-01-14 DIAGNOSIS — Z01419 Encounter for gynecological examination (general) (routine) without abnormal findings: Secondary | ICD-10-CM | POA: Insufficient documentation

## 2011-01-14 DIAGNOSIS — E119 Type 2 diabetes mellitus without complications: Secondary | ICD-10-CM

## 2011-01-14 DIAGNOSIS — N898 Other specified noninflammatory disorders of vagina: Secondary | ICD-10-CM

## 2011-01-14 LAB — POCT URINALYSIS DIPSTICK
Bilirubin, UA: NEGATIVE
Glucose, UA: >=1000
Ketones, UA: NEGATIVE
Nitrite, UA: NEGATIVE
Protein, UA: 30
Spec Grav, UA: 1.01
Urobilinogen, UA: 0.2
pH, UA: 6.5

## 2011-01-14 LAB — GLUCOSE, CAPILLARY: Glucose-Capillary: 435 mg/dL — ABNORMAL HIGH (ref 70–99)

## 2011-01-14 LAB — POCT GLYCOSYLATED HEMOGLOBIN (HGB A1C): Hemoglobin A1C: 8.6

## 2011-01-14 MED ORDER — CLOTRIMAZOLE 1 % VA CREA: 1.0000 | TOPICAL_CREAM | Freq: Two times a day (BID) | VAGINAL | Status: AC

## 2011-01-14 MED ORDER — PHENAZOPYRIDINE HCL 100 MG PO TABS: 100.0000 mg | ORAL_TABLET | Freq: Three times a day (TID) | ORAL | Status: AC | PRN

## 2011-01-14 MED ORDER — FLUCONAZOLE 150 MG PO TABS: 150.0000 mg | ORAL_TABLET | Freq: Once | ORAL | Status: AC

## 2011-01-14 MED ORDER — NITROFURANTOIN MONOHYD MACRO 100 MG PO CAPS: 100.0000 mg | ORAL_CAPSULE | Freq: Two times a day (BID) | ORAL | Status: AC

## 2011-01-14 NOTE — Patient Instructions (Signed)
Urinary Tract Infection (UTI) Infections of the urinary tract can start in several places. A bladder infection (cystitis), a kidney infection (pyelonephritis), and a prostate infection (prostatitis) are different types of urinary tract infections. They usually get better if treated with medicines (antibiotics) that kill germs. Take all the medicine until it is gone. You or your child may feel better in a few days, but TAKE ALL MEDICINE or the infection may not respond and may become more difficult to treat. HOME CARE INSTRUCTIONS  Drink enough water and fluids to keep the urine clear or pale yellow. Cranberry juice is especially recommended, in addition to large amounts of water.   Avoid caffeine, tea, and carbonated beverages. They tend to irritate the bladder.   Alcohol may irritate the prostate.   Only take over-the-counter or prescription medicines for pain, discomfort, or fever as directed by your caregiver.  FINDING OUT THE RESULTS OF YOUR TEST Not all test results are available during your visit. If your or your child's test results are not back during the visit, make an appointment with your caregiver to find out the results. Do not assume everything is normal if you have not heard from your caregiver or the medical facility. It is important for you to follow up on all test results. TO PREVENT FURTHER INFECTIONS:  Empty the bladder often. Avoid holding urine for long periods of time.   After a bowel movement, women should cleanse from front to back. Use each tissue only once.   Empty the bladder before and after sexual intercourse.  SEEK MEDICAL CARE IF:  There is back pain.   You or your child has an oral temperature above 101F.   Your baby is older than 3 months with a rectal temperature of 100.5 F (38.1 C) or higher for more than 1 day.   Your or your child's problems (symptoms) are no better in 3 days. Return sooner if you or your child is getting worse.  SEEK IMMEDIATE  MEDICAL CARE IF:  There is severe back pain or lower abdominal pain.   You or your child develops chills.   You or your child has an oral temperature above 101F, not controlled by medicine.   Your baby is older than 3 months with a rectal temperature of 102 F (38.9 C) or higher.   Your baby is 53 months old or younger with a rectal temperature of 100.4 F (38 C) or higher.   There is nausea or vomiting.   There is continued burning or discomfort with urination.  MAKE SURE YOU:  Understand these instructions.   Will watch this condition.   Will get help right away if you or your child is not doing well or gets worse.  Document Released: 06/26/2005 Document Re-Released: 12/11/2009 [facility] Patient Information 2011 [place].   See your PCP next week for a follow up appointment. Seek medicare care if you feel worse by calling clinic.

## 2011-01-14 NOTE — Telephone Encounter (Signed)
Pt calls and states she must be seen, states she has for 2 wks, itching burning, vaginally, possibly increased frequency urinating, she states now she is having a thick discharge, her labia and vagina are swollen and it hurts when she voids. Denies fevers, denies anything being used to alleviate, voiding aggravates. appt given w/ dr Cathren Laine at 1045.

## 2011-01-15 LAB — GC/CHLAMYDIA PROBE AMP, GENITAL
Chlamydia, DNA Probe: NEGATIVE
GC Probe Amp, Genital: NEGATIVE

## 2011-01-15 LAB — WET PREP BY MOLECULAR PROBE
Candida species: POSITIVE — AB
Gardnerella vaginalis: POSITIVE — AB
Trichomonas vaginosis: NEGATIVE

## 2011-01-15 NOTE — Assessment & Plan Note (Signed)
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 Patient mentions that she has been missing her doses on Lantus and regular NovoLog with meals. Patient agrees to be more compliant with her regimen.

## 2011-01-15 NOTE — Progress Notes (Signed)
  Subjective:    Patient ID: Kayla [name], female    DOB: [birth year], 50 y.o.   MRN: [id number]  HPI Patient is a 50 year old female who comes in with chief complaint of vaginal discharge. The discharge is described as white cheesy and is not associated with any foul smell. Discharge is associated with itching. Patient has had similar type of discharge and the past and was given Diflucan. Patient also complains that she has been urinating more frequently in the past few days. No complaint of abdominal pain. There is no blood noted from the discharge or in urine. Patient is also due for a Pap smear. Patient's diabetes is poorly controlled with A1c of 8.6 today. 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 Patient requests GC chlamydia and says that she is still sexually active.   Review of Systems  Constitutional: Negative for fever, activity change and appetite change.  HENT: Negative for sore throat.   Respiratory: Negative for cough and shortness of breath.   Cardiovascular: Negative for chest pain and leg swelling.  Gastrointestinal: Negative for nausea, abdominal pain, diarrhea, constipation and abdominal distention.  Genitourinary: Positive for dysuria, urgency, frequency, vaginal discharge and difficulty urinating. Negative for hematuria.  Neurological: Negative for dizziness and headaches.  Psychiatric/Behavioral: Negative for suicidal ideas and behavioral problems.       Objective:   Physical Exam  Constitutional: She is oriented to person, place, and time. She appears well-developed and well-nourished.  HENT:  Head: Normocephalic and atraumatic.  Eyes: Conjunctivae and EOM are normal. Pupils are equal, round, and reactive to light. No scleral icterus.  Neck: Normal range of motion. Neck supple. No JVD present. No thyromegaly present.  Cardiovascular: Normal rate, regular rhythm, normal heart sounds and intact distal pulses.  Exam reveals no gallop and no  friction rub.   No murmur heard. Pulmonary/Chest: Effort normal and breath sounds normal. No respiratory distress. She has no wheezes. She has no rales.  Abdominal: Soft. Bowel sounds are normal. She exhibits no distension and no mass. There is no tenderness. There is no rebound and no guarding.  Genitourinary: Vaginal discharge found.       White, cheesy vaginal discharge noted, pap smear done from the cervix, mild bleeding noted from the cervical aperture.  Musculoskeletal: Normal range of motion. She exhibits no edema and no tenderness.  Lymphadenopathy:    She has no cervical adenopathy.  Neurological: She is alert and oriented to person, place, and time.  Psychiatric: She has a normal mood and affect. Her behavior is normal.          Assessment & Plan:

## 2011-01-15 NOTE — Assessment & Plan Note (Signed)
Given that patient's diabetes is poorly controlled and the typical characteristics, I think this is most likely an yeast infection. I would prescribe a one-time dose of Diflucan and clotrimazole vaginal cream for 7 days. I have checked GC, chlamydia just because patient is still sexually active. Since patient is due for a Pap smear I would go ahead and do it today. Patient was advised to control her blood glucose better to decrease her future episodes of yeast infection.

## 2011-01-16 ENCOUNTER — Telehealth: Payer: Self-pay | Admitting: Internal Medicine

## 2011-01-16 NOTE — Telephone Encounter (Signed)
We tried to contact the patient multiple times but the numbers which are in the chart are unreachable. Patient's Gardnerella vaginalis was positive and she is supposed to be started on Flagyl 500 mg twice a day for next 7 days. I will try to send a letter out to her house explaining about the results.

## 2011-01-17 NOTE — Telephone Encounter (Signed)
She has an OBGYN-I think Jannifer Rodney needs to be seen for this ASAP. If GYN cannot see her she will need to be seen by first available resident.

## 2011-01-24 ENCOUNTER — Ambulatory Visit (INDEPENDENT_AMBULATORY_CARE_PROVIDER_SITE_OTHER): Payer: Medicaid Other | Admitting: Internal Medicine

## 2011-01-24 ENCOUNTER — Encounter: Payer: Self-pay | Admitting: Internal Medicine

## 2011-01-24 ENCOUNTER — Other Ambulatory Visit: Payer: Self-pay | Admitting: *Deleted

## 2011-01-24 DIAGNOSIS — N390 Urinary tract infection, site not specified: Secondary | ICD-10-CM

## 2011-01-24 DIAGNOSIS — F329 Major depressive disorder, single episode, unspecified: Secondary | ICD-10-CM

## 2011-01-24 DIAGNOSIS — R809 Proteinuria, unspecified: Secondary | ICD-10-CM

## 2011-01-24 DIAGNOSIS — K5900 Constipation, unspecified: Secondary | ICD-10-CM

## 2011-01-24 DIAGNOSIS — IMO0002 Reserved for concepts with insufficient information to code with codable children: Secondary | ICD-10-CM

## 2011-01-24 DIAGNOSIS — M792 Neuralgia and neuritis, unspecified: Secondary | ICD-10-CM

## 2011-01-24 DIAGNOSIS — E119 Type 2 diabetes mellitus without complications: Secondary | ICD-10-CM

## 2011-01-24 DIAGNOSIS — H9190 Unspecified hearing loss, unspecified ear: Secondary | ICD-10-CM

## 2011-01-24 DIAGNOSIS — N760 Acute vaginitis: Secondary | ICD-10-CM

## 2011-01-24 DIAGNOSIS — H6690 Otitis media, unspecified, unspecified ear: Secondary | ICD-10-CM

## 2011-01-24 DIAGNOSIS — IMO0001 Reserved for inherently not codable concepts without codable children: Secondary | ICD-10-CM

## 2011-01-24 DIAGNOSIS — A499 Bacterial infection, unspecified: Secondary | ICD-10-CM

## 2011-01-24 DIAGNOSIS — B9689 Other specified bacterial agents as the cause of diseases classified elsewhere: Secondary | ICD-10-CM

## 2011-01-24 DIAGNOSIS — R030 Elevated blood-pressure reading, without diagnosis of hypertension: Secondary | ICD-10-CM

## 2011-01-24 DIAGNOSIS — E039 Hypothyroidism, unspecified: Secondary | ICD-10-CM

## 2011-01-24 LAB — GLUCOSE, CAPILLARY: Glucose-Capillary: 250 mg/dL — ABNORMAL HIGH (ref 70–99)

## 2011-01-24 LAB — LIPID PANEL
Cholesterol: 217 mg/dL — ABNORMAL HIGH (ref 0–200)
HDL: 59 mg/dL (ref >39)
LDL Cholesterol: 138 mg/dL — ABNORMAL HIGH (ref 0–99)
Total CHOL/HDL Ratio: 3.7 Ratio
Triglycerides: 102 mg/dL (ref <150)
VLDL: 20 mg/dL (ref 0–40)

## 2011-01-24 LAB — CBC WITH DIFFERENTIAL/PLATELET
Basophils Absolute: 0.1 10*3/uL (ref 0.0–0.1)
Basophils Relative: 1 % (ref 0–1)
Eosinophils Absolute: 0.1 10*3/uL (ref 0.0–0.7)
Eosinophils Relative: 1 % (ref 0–5)
HCT: 31.2 % — ABNORMAL LOW (ref 36.0–46.0)
Hemoglobin: 9.9 g/dL — ABNORMAL LOW (ref 12.0–15.0)
Lymphocytes Relative: 19 % (ref 12–46)
Lymphs Abs: 2 10*3/uL (ref 0.7–4.0)
MCH: 24.1 pg — ABNORMAL LOW (ref 26.0–34.0)
MCHC: 31.7 g/dL (ref 30.0–36.0)
MCV: 76.1 fL — ABNORMAL LOW (ref 78.0–100.0)
Monocytes Absolute: 0.5 10*3/uL (ref 0.1–1.0)
Monocytes Relative: 5 % (ref 3–12)
Neutro Abs: 7.9 10*3/uL — ABNORMAL HIGH (ref 1.7–7.7)
Neutrophils Relative %: 74 % (ref 43–77)
Platelets: 276 10*3/uL (ref 150–400)
RBC: 4.1 MIL/uL (ref 3.87–5.11)
RDW: 22.5 % — ABNORMAL HIGH (ref 11.5–15.5)
WBC: 10.4 10*3/uL (ref 4.0–10.5)

## 2011-01-24 LAB — BASIC METABOLIC PANEL
BUN: 9 mg/dL (ref 6–23)
CO2: 24 mEq/L (ref 19–32)
Calcium: 10.9 mg/dL — ABNORMAL HIGH (ref 8.4–10.5)
Chloride: 103 mEq/L (ref 96–112)
Creat: 1.09 mg/dL (ref 0.40–1.20)
Glucose, Bld: 215 mg/dL — ABNORMAL HIGH (ref 70–99)
Potassium: 4.3 mEq/L (ref 3.5–5.3)
Sodium: 139 mEq/L (ref 135–145)

## 2011-01-24 LAB — T4, FREE: Free T4: 0.14 ng/dL — ABNORMAL LOW (ref 0.80–1.80)

## 2011-01-24 LAB — TSH: TSH: 120.716 u[IU]/mL — ABNORMAL HIGH (ref 0.350–4.500)

## 2011-01-24 MED ORDER — LEVOTHYROXINE SODIUM 75 MCG PO TABS: 75.0000 ug | ORAL_TABLET | Freq: Every day | ORAL | Status: DC

## 2011-01-24 MED ORDER — FLUCONAZOLE 150 MG PO TABS: 150.0000 mg | ORAL_TABLET | Freq: Once | ORAL | Status: AC

## 2011-01-24 MED ORDER — CIPROFLOXACIN-HYDROCORTISONE 0.2-1 % OT SUSP: 3.0000 [drp] | Freq: Two times a day (BID) | OTIC | Status: DC

## 2011-01-24 MED ORDER — AMITRIPTYLINE HCL 150 MG PO TABS: 150.0000 mg | ORAL_TABLET | Freq: Every day | ORAL | Status: DC

## 2011-01-24 MED ORDER — INSULIN GLARGINE 100 UNIT/ML ~~LOC~~ SOLN: SUBCUTANEOUS | Status: DC

## 2011-01-24 MED ORDER — METRONIDAZOLE 250 MG PO TABS: 250.0000 mg | ORAL_TABLET | Freq: Two times a day (BID) | ORAL | Status: AC

## 2011-01-24 MED ORDER — POLYETHYLENE GLYCOL 3350 17 GM/SCOOP PO POWD: 17.0000 g | Freq: Every day | ORAL | Status: AC

## 2011-01-24 MED ORDER — GABAPENTIN 600 MG PO TABS: 600.0000 mg | ORAL_TABLET | Freq: Three times a day (TID) | ORAL | Status: DC

## 2011-01-24 NOTE — Progress Notes (Signed)
  Subjective:    Patient ID: Kayla [name], female    DOB: 21-Jun-1961, 50 y.o.   MRN: [id number]  HPI    Review of Systems     Objective:   Physical Exam        Assessment & Plan:

## 2011-01-25 ENCOUNTER — Telehealth: Payer: Self-pay | Admitting: *Deleted

## 2011-01-25 LAB — URINALYSIS

## 2011-01-25 MED ORDER — CIPROFLOXACIN HCL 0.2 % OT SOLN: 0.2000 mL | Freq: Two times a day (BID) | OTIC | Status: AC

## 2011-01-25 NOTE — Telephone Encounter (Signed)
Pt calls to say she cannot get her ear drops "needs some approval" i called the pharm, it needs a prior auth, pharm states that insurance may cover plain cipro ear drops w/o hydrocortisone. If not the Josem Kaufmann will take 48 to 72 hrs and w/ this being Friday... Please consider and advise, also has some refills that need approval

## 2011-01-26 LAB — URINE CULTURE: Colony Count: 25000

## 2011-01-28 ENCOUNTER — Telehealth: Payer: Self-pay | Admitting: *Deleted

## 2011-01-28 NOTE — Telephone Encounter (Signed)
Open in error

## 2011-01-31 ENCOUNTER — Encounter: Payer: Self-pay | Admitting: Internal Medicine

## 2011-01-31 MED ORDER — METFORMIN HCL 1000 MG PO TABS: 1000.0000 mg | ORAL_TABLET | Freq: Two times a day (BID) | ORAL | Status: DC

## 2011-01-31 MED ORDER — GLIPIZIDE ER 10 MG PO TB24: 10.0000 mg | ORAL_TABLET | Freq: Every day | ORAL | Status: DC

## 2011-01-31 MED ORDER — ZOLPIDEM TARTRATE 10 MG PO TABS: 10.0000 mg | ORAL_TABLET | Freq: Every evening | ORAL | Status: DC | PRN

## 2011-01-31 NOTE — Telephone Encounter (Signed)
Ambien called in.

## 2011-02-11 ENCOUNTER — Telehealth: Payer: Self-pay | Admitting: *Deleted

## 2011-02-11 NOTE — Telephone Encounter (Signed)
Attempts to call pt to notify her of her Podiatry appointment.  Message left on pt's voicemail to call the [facility].  Call to dr. Mellody Drown office.  Pt was not contacted about appointment to reschedule when pt calls the Clinics.

## 2011-02-11 NOTE — Telephone Encounter (Signed)
Reviewed-appropriate action taken. Paper scripts given at her appointment.

## 2011-02-11 NOTE — Telephone Encounter (Signed)
Noted  

## 2011-02-12 ENCOUNTER — Telehealth: Payer: Self-pay | Admitting: *Deleted

## 2011-02-12 NOTE — Telephone Encounter (Signed)
Scheduled for GYN appointment with Dr. Mancel Bale on 02/15/2011 at 10:45 Am.  Pt is aware of appointment.

## 2011-02-20 ENCOUNTER — Telehealth: Payer: Self-pay | Admitting: *Deleted

## 2011-02-20 ENCOUNTER — Ambulatory Visit (INDEPENDENT_AMBULATORY_CARE_PROVIDER_SITE_OTHER): Payer: Medicaid Other | Admitting: Internal Medicine

## 2011-02-20 ENCOUNTER — Encounter: Payer: Self-pay | Admitting: Internal Medicine

## 2011-02-20 DIAGNOSIS — E119 Type 2 diabetes mellitus without complications: Secondary | ICD-10-CM

## 2011-02-20 DIAGNOSIS — K5900 Constipation, unspecified: Secondary | ICD-10-CM | POA: Insufficient documentation

## 2011-02-20 DIAGNOSIS — H9190 Unspecified hearing loss, unspecified ear: Secondary | ICD-10-CM

## 2011-02-20 DIAGNOSIS — IMO0001 Reserved for inherently not codable concepts without codable children: Secondary | ICD-10-CM

## 2011-02-20 DIAGNOSIS — R030 Elevated blood-pressure reading, without diagnosis of hypertension: Secondary | ICD-10-CM

## 2011-02-20 LAB — GLUCOSE, CAPILLARY: Glucose-Capillary: 109 mg/dL — ABNORMAL HIGH (ref 70–99)

## 2011-02-20 MED ORDER — POLYETHYLENE GLYCOL 3350 17 G PO PACK: 17.0000 g | PACK | Freq: Every day | ORAL | Status: AC

## 2011-02-20 MED ORDER — OLMESARTAN 10 MG HALF TABLET: 10.0000 mg | ORAL_TABLET | Freq: Every day | ORAL | Status: DC

## 2011-02-20 NOTE — Patient Instructions (Addendum)
We will make a referral for your hearing testing.  Once that is complete we will have your follow up with an Ear, Nose, and Throat Specialist.    Start Olmesartan 20 mg tablets take 1/2 tablet daily at bedtime.    Follow up with your PCP in 2-3 months for a recheck of your blood pressure and your diabetes.

## 2011-02-20 NOTE — Progress Notes (Signed)
  Subjective:    Patient ID: Kayla [name], female    DOB: 11-Jan-1961, 50 y.o.   MRN: [id number]  HPI  Kayla [name] is a 50 year old woman who presents to clinic today complaining of problems with her ear.  She states that she has been having problems hearing in her right ear.  She states that it has been going on for about a month or so.  She denies any ear pain, drainage, trauma, congestions, or ringing in her ears.  She states that her hearing has an echo and it is very hard to hear.  She has not seen an ENT in the past.  She has has been having issues with her Gabapentin with excessive sleepiness so much so that she has been taking 300 mg in the AM and 300 at noon, and then 600 mg at night.  She denies any increase in her neuropathic pain since decreasing the dose.  She also has stopped taking her losartan.  She states that it makes her sick.  When asked to elaborate she is unable to tell me any specific symptoms and just states that it makes her sick.  She denies nausea, coughing, dizziness, vomiting, diarrhea, constipation, or headaches.    Review of Systems    Constitutional: Denies fever, chills, diaphoresis, appetite change and fatigue.  HEENT: Positive for  hearing loss Denies photophobia, eye pain, redness, ear pain, congestion, sore throat, rhinorrhea, sneezing, mouth sores, trouble swallowing, neck pain, neck stiffness and tinnitus.   Respiratory: Denies SOB, DOE, cough, chest tightness,  and wheezing.   Cardiovascular: Denies chest pain, palpitations and leg swelling.  Gastrointestinal: Denies nausea, vomiting, abdominal pain, diarrhea, constipation, blood in stool and abdominal distention.  Genitourinary: Denies dysuria, urgency, frequency, hematuria, flank pain and difficulty urinating.  Musculoskeletal: Denies myalgias, back pain, joint swelling, arthralgias and gait problem.  Skin: Denies pallor, rash and wound.  Neurological: Denies dizziness, seizures, syncope, weakness,  light-headedness, numbness and headaches.  Hematological: Denies adenopathy. Easy bruising, personal or family bleeding history  Psychiatric/Behavioral: Denies suicidal ideation, mood changes, confusion, nervousness, sleep disturbance and agitation   Objective:   Physical Exam    Constitutional: Vital signs reviewed.  Patient is a well-developed and well-nourished woman in no acute distress and cooperative with exam. Alert and oriented x3.  Head: Normocephalic and atraumatic Ear: TM normal bilaterally, ear canals are free of cerecum.  Weber test shows decreased hearing on the right.  Gross examination of her hearing shows a decrease in hearing of the right ear. Mouth: no erythema or exudates, MMM Eyes: PERRL, EOMI, conjunctivae normal, No scleral icterus.  Neck: Supple, Trachea midline normal ROM, No JVD, mass, thyromegaly, or carotid bruit present.  Cardiovascular: RRR, S1 normal, S2 normal, no MRG, pulses symmetric and intact bilaterally Pulmonary/Chest: CTAB, no wheezes, rales, or rhonchi Abdominal: Soft. Non-tender, non-distended, bowel sounds are normal, no masses, organomegaly, or guarding present.  GU: no CVA tenderness Musculoskeletal: No joint deformities, erythema, or stiffness, ROM full and no nontender Hematology: no cervical, inginal, or axillary adenopathy.  Neurological: A&O x3, Strenght is normal and symmetric bilaterally, cranial nerve II-XII are grossly intact, no focal motor deficit, sensory intact to light touch bilaterally.  Skin: Warm, dry and intact. No rash, cyanosis, or clubbing.  Psychiatric: Normal mood and affect. speech and behavior is normal. Judgment and thought content normal. Cognition and memory are normal.       Assessment & Plan:

## 2011-02-20 NOTE — Telephone Encounter (Signed)
Received faxed request from pharmacy regarding pt's Zolpidem 10mg .  Prior authorization now needed in order for pt to continue getting.  It's on the preferred formulary, but pt must meed "criteria".  Attempted to initiate PA over the phone, but unable to do so, MD must complete the form.  Once completed, it must be faxed back and medicaid will make a decision.  Pt here today during visit with Dr Obie Dredge and she states she takes the [place] every night.  Will place faxed form in PCP's box for completion.KayeGoldston,[id number]/23/201211:57 AM

## 2011-02-21 ENCOUNTER — Telehealth: Payer: Self-pay | Admitting: *Deleted

## 2011-02-21 NOTE — Telephone Encounter (Signed)
[facility] had called yesterday to clarify instruction on Benicar. I talked to Dr. Obie Dredge; instruction is 1 tab (10 mg) at bedtime.  VO given to [place] at [facility].

## 2011-02-26 NOTE — Assessment & Plan Note (Signed)
BP Readings from Last 3 Encounters:  02/20/11 140/83  01/24/11 133/74  01/14/11 141/72   Her blood pressure is elevated today above her goal.  As a diabetic her goal is 130/80 and would benefit from an ACE or ARB.  She has been unable to tolerate the cough with an ACE inhibitor and is having side effects from [place].  We will have her try a small dose of olmesartan at bedtime to see if she can tolerate the medication and have her follow up for her blood pressure.  If she is unable to tolerate the medication at bedtime she was encouraged to call us before she stops it.

## 2011-02-26 NOTE — Assessment & Plan Note (Signed)
Lab Results  Component Value Date   HGBA1C 8.6 01/14/2011   HGBA1C 7.1 08/30/2010   HGBA1C 7.0 05/24/2010   Lab Results  Component Value Date   MICROALBUR 16.43* 11/13/2009   LDLCALC 138* 01/24/2011   CREATININE 1.09 01/24/2011   A1C is rising some.  She is encouraged to continue to take her medications and work hard at weight loss and watching her diet.

## 2011-02-26 NOTE — Assessment & Plan Note (Signed)
She has some objective findings of hearing loss on the right. TM is normal on both side without evidence of swelling or perforation.  No findings of dizziness or vertigo that would suggest CNS manifestation.  We will have her scheduled for audiometry testing and when that is complete will have her follow up with an ENT specialist.

## 2011-02-28 ENCOUNTER — Telehealth: Payer: Self-pay | Admitting: *Deleted

## 2011-02-28 NOTE — Telephone Encounter (Signed)
Pharm [facility] and pt aware Benicar was approved for one year till 02/28/12. Hilda Blades Abi Shoults RN 02/28/11 4:30PM

## 2011-03-05 NOTE — Telephone Encounter (Signed)
I will complete PA.

## 2011-03-19 NOTE — Telephone Encounter (Signed)
Kayla [name], I am not sure If I did this or not-I filled out a big stack of paperwork last week and it may have been in there. Let me know and I will complete the PA. Thanks! BG

## 2011-03-21 ENCOUNTER — Ambulatory Visit: Payer: Medicaid Other | Attending: Internal Medicine | Admitting: Audiology

## 2011-03-21 DIAGNOSIS — H918X9 Other specified hearing loss, unspecified ear: Secondary | ICD-10-CM | POA: Insufficient documentation

## 2011-04-13 ENCOUNTER — Other Ambulatory Visit: Payer: Self-pay | Admitting: Internal Medicine

## 2011-04-15 ENCOUNTER — Other Ambulatory Visit: Payer: Self-pay | Admitting: *Deleted

## 2011-04-15 MED ORDER — ZOLPIDEM TARTRATE 10 MG PO TABS: 10.0000 mg | ORAL_TABLET | Freq: Every evening | ORAL | Status: DC | PRN

## 2011-04-15 NOTE — Telephone Encounter (Signed)
Faxed to the [employer] in [place] per pt request.

## 2011-04-18 ENCOUNTER — Encounter: Payer: Self-pay | Admitting: *Deleted

## 2011-04-18 ENCOUNTER — Encounter: Payer: Self-pay | Admitting: Internal Medicine

## 2011-04-18 NOTE — Telephone Encounter (Signed)
Note opened in error. Yvonna Alanis, RN, 04/18/2011, 11:44 A

## 2011-05-10 ENCOUNTER — Encounter: Payer: Self-pay | Admitting: Internal Medicine

## 2011-05-10 ENCOUNTER — Ambulatory Visit (INDEPENDENT_AMBULATORY_CARE_PROVIDER_SITE_OTHER): Payer: Medicaid Other | Admitting: Internal Medicine

## 2011-05-10 VITALS — BP 140/77 | HR 101 | Temp 98.8°F | Wt 174.8 lb

## 2011-05-10 DIAGNOSIS — G4700 Insomnia, unspecified: Secondary | ICD-10-CM

## 2011-05-10 DIAGNOSIS — R030 Elevated blood-pressure reading, without diagnosis of hypertension: Secondary | ICD-10-CM

## 2011-05-10 DIAGNOSIS — IMO0001 Reserved for inherently not codable concepts without codable children: Secondary | ICD-10-CM

## 2011-05-10 DIAGNOSIS — E119 Type 2 diabetes mellitus without complications: Secondary | ICD-10-CM

## 2011-05-10 LAB — GLUCOSE, CAPILLARY
Glucose-Capillary: 56 mg/dL — ABNORMAL LOW (ref 70–99)
Glucose-Capillary: 58 mg/dL — ABNORMAL LOW (ref 70–99)

## 2011-05-10 LAB — POCT GLYCOSYLATED HEMOGLOBIN (HGB A1C): Hemoglobin A1C: 7.1

## 2011-05-10 MED ORDER — ACCU-CHEK NANO SMARTVIEW W/DEVICE KIT: 1.0000 | PACK | Freq: Three times a day (TID) | Status: DC

## 2011-05-10 MED ORDER — ACCU-CHEK FASTCLIX LANCETS MISC: 1.0000 | Freq: Three times a day (TID) | Status: DC

## 2011-05-10 MED ORDER — INSULIN GLARGINE 100 UNIT/ML ~~LOC~~ SOLN: SUBCUTANEOUS | Status: DC

## 2011-05-10 MED ORDER — GLUCOSE BLOOD VI STRP: ORAL_STRIP | Status: AC

## 2011-05-10 MED ORDER — INSULIN ASPART 100 UNIT/ML ~~LOC~~ SOLN: SUBCUTANEOUS | Status: DC

## 2011-05-10 MED ORDER — ZOLPIDEM TARTRATE 10 MG PO TABS: 10.0000 mg | ORAL_TABLET | Freq: Every evening | ORAL | Status: DC | PRN

## 2011-05-10 NOTE — Progress Notes (Signed)
  Subjective:    Patient ID: Kayla [name], female    DOB: 10-May-1961, 50 y.o.   MRN: [id number]  HPI: 50 year old woman with past medical history significant for type 2 diabetes, hyperlipidemia, hypothyroidism comes to the clinic for a followup visit.  She states that she went to the pharmacy today and had some problems with her medication.  She states that she has been taking 10- 15 units of NovoLog with each meal 40 units of Lantus her blood sugar. She states that her blood sugars have been running high lately sometimes they're as high as 500. She takes extra glipizide only when she sees her blood sugar in 500s. She states that she has tried Scientist, water quality for a month but stopped after that because it made her feel weird.    Review of Systems  Constitutional: Negative for fever, chills, activity change and fatigue.  HENT: Negative for nosebleeds, congestion, rhinorrhea, postnasal drip and tinnitus.   Eyes: Negative for visual disturbance.  Respiratory: Negative for apnea, cough, choking, chest tightness and shortness of breath.   Cardiovascular: Negative for chest pain, palpitations and leg swelling.  Gastrointestinal: Negative for abdominal pain.  Genitourinary: Negative for dysuria, urgency and difficulty urinating.  Musculoskeletal: Negative for arthralgias.  Neurological: Negative for dizziness, facial asymmetry and headaches.  Hematological: Negative for adenopathy.       Objective:   Physical Exam  Constitutional: She is oriented to person, place, and time. She appears well-developed and well-nourished. No distress.  HENT:  Head: Normocephalic and atraumatic.  Mouth/Throat: No oropharyngeal exudate.  Eyes: Conjunctivae and EOM are normal. Pupils are equal, round, and reactive to light.  Neck: Normal range of motion. Neck supple. No JVD present. No tracheal deviation present. No thyromegaly present.  Cardiovascular: Normal rate, regular rhythm, normal heart sounds and intact distal  pulses.  Exam reveals no gallop and no friction rub.   No murmur heard. Pulmonary/Chest: Effort normal and breath sounds normal. No stridor. No respiratory distress. She has no wheezes. She has no rales. She exhibits no tenderness.  Abdominal: Soft. Bowel sounds are normal. She exhibits no distension and no mass. There is no tenderness. There is no rebound and no guarding.  Musculoskeletal: Normal range of motion. She exhibits no edema and no tenderness.  Lymphadenopathy:    She has no cervical adenopathy.  Neurological: She is alert and oriented to person, place, and time. She has normal reflexes. She displays normal reflexes. No cranial nerve deficit. Coordination normal.  Skin: She is not diaphoretic.          Assessment & Plan:

## 2011-05-10 NOTE — Patient Instructions (Signed)
Please schedule a follow up appointment in 2 weeks. Please get your glucometer with the next clinic appointment. I have called in your prescriptions at  Your pharmacy.

## 2011-05-12 NOTE — Assessment & Plan Note (Signed)
She took Benicar for one month but stopped after that because of its side effects-could not explain that to me. She does not want to try any other ACE inhibitor or any other type of antihypertensive agent.

## 2011-05-12 NOTE — Assessment & Plan Note (Signed)
She did not get her glucose meter today. States that recently her blood sugars have been up to as high as 500 as she has been undergoing a lot of stress lately. Her regimen includes 15 units of NovoLog with each meal ,  40 units of Lantus and glipizide. Her blood sugar in the clinic was 58 as apparently she missed her snack/meal.  She has not been taking ACE inhibitors -Benicar because of its side effects. Does not want to even try a different one. Plan: -She was advised to cut down on her NovoLog from 15 to  5-10 units depending on her blood sugars readings.  -She was advised not to take glipizide for now. -She was advised to followup in our clinic in 2 weeks and get her glucose meter along with her.  -For her low blood sugars in the clinic she was given some glucose tablets. -Her prescription for meter, strips, lancets and insulin were called to the pharmacy.

## 2011-05-14 ENCOUNTER — Encounter: Payer: Self-pay | Admitting: Internal Medicine

## 2011-05-14 NOTE — Assessment & Plan Note (Signed)
Fluctuates between good control and periods of complete disease neglect- usually when her depression is bad or she has life stress.

## 2011-05-14 NOTE — Assessment & Plan Note (Signed)
Encouraged her to restart medication for her depression.

## 2011-05-14 NOTE — Assessment & Plan Note (Signed)
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

## 2011-05-14 NOTE — Assessment & Plan Note (Signed)
Encouraged her to take her ACE-I.

## 2011-05-14 NOTE — Assessment & Plan Note (Addendum)
Will continue to monitor. Needs to be on ARB if possible, question if ACE-I caused cough in past- not entirely clear.

## 2011-05-15 ENCOUNTER — Other Ambulatory Visit: Payer: Self-pay | Admitting: *Deleted

## 2011-05-15 MED ORDER — GLUCOSE BLOOD VI STRP: ORAL_STRIP | Status: AC

## 2011-05-15 MED ORDER — ACCU-CHEK AVIVA PLUS W/DEVICE KIT: 1.0000 | PACK | Status: AC

## 2011-05-15 MED ORDER — ACCU-CHEK SOFTCLIX LANCETS MISC: Status: DC

## 2011-07-02 LAB — GLUCOSE, CAPILLARY
Glucose-Capillary: 100 — ABNORMAL HIGH
Glucose-Capillary: 171 — ABNORMAL HIGH

## 2011-07-03 LAB — GLUCOSE, CAPILLARY
Glucose-Capillary: 62 — ABNORMAL LOW
Glucose-Capillary: 86

## 2011-07-05 LAB — GLUCOSE, CAPILLARY: Glucose-Capillary: 326 mg/dL — ABNORMAL HIGH (ref 70–99)

## 2011-08-28 ENCOUNTER — Telehealth: Payer: Self-pay | Admitting: *Deleted

## 2011-08-28 ENCOUNTER — Encounter: Payer: Medicaid Other | Admitting: Internal Medicine

## 2011-08-28 NOTE — Telephone Encounter (Signed)
Pt has been scheduled per sharonb. For 11/29 at [place]

## 2011-08-28 NOTE — Telephone Encounter (Signed)
Pt calls and leaves message that she has UTI x 3weeks and is getting "real sick" with it, desires appt, appt obtained for 1400 today w/ dr schooler, attempted to call pt back and got answering machine, lm to call clinic asap

## 2011-08-29 ENCOUNTER — Encounter: Payer: Self-pay | Admitting: Internal Medicine

## 2011-08-29 ENCOUNTER — Ambulatory Visit (INDEPENDENT_AMBULATORY_CARE_PROVIDER_SITE_OTHER): Payer: Self-pay | Admitting: Internal Medicine

## 2011-08-29 ENCOUNTER — Ambulatory Visit: Payer: Medicaid Other | Admitting: Internal Medicine

## 2011-08-29 VITALS — BP 107/65 | HR 84 | Temp 97.5°F | Ht 62.0 in | Wt 178.5 lb

## 2011-08-29 DIAGNOSIS — N390 Urinary tract infection, site not specified: Secondary | ICD-10-CM

## 2011-08-29 DIAGNOSIS — E119 Type 2 diabetes mellitus without complications: Secondary | ICD-10-CM

## 2011-08-29 DIAGNOSIS — E785 Hyperlipidemia, unspecified: Secondary | ICD-10-CM

## 2011-08-29 LAB — GLUCOSE, CAPILLARY
Glucose-Capillary: 46 mg/dL — ABNORMAL LOW (ref 70–99)
Glucose-Capillary: 52 mg/dL — ABNORMAL LOW (ref 70–99)
Glucose-Capillary: 78 mg/dL (ref 70–99)

## 2011-08-29 LAB — POCT GLYCOSYLATED HEMOGLOBIN (HGB A1C): Hemoglobin A1C: 8.8

## 2011-08-29 MED ORDER — SULFAMETHOXAZOLE-TRIMETHOPRIM 800-160 MG PO TABS: 1.0000 | ORAL_TABLET | Freq: Two times a day (BID) | ORAL | Status: AC

## 2011-08-29 MED ORDER — METFORMIN HCL 1000 MG PO TABS: 1000.0000 mg | ORAL_TABLET | Freq: Two times a day (BID) | ORAL | Status: DC

## 2011-08-29 MED ORDER — PRAVASTATIN SODIUM 20 MG PO TABS: 20.0000 mg | ORAL_TABLET | Freq: Every day | ORAL | Status: DC

## 2011-08-29 MED ORDER — MICONAZOLE 1 1200 & 2 MG & % VA KIT: 1.0000 | PACK | Freq: Every day | VAGINAL | Status: DC

## 2011-08-29 NOTE — Patient Instructions (Addendum)
It was nice to meet you today Ms. Presley.  As we discussed: #1 Start taking pravastatin 20 mg daily for your high cholesterol  #2 your blood sugar was too low today.  Your hemoglobin A1c is 8.8, increased from your last level of 7.1. Stop taking glipizide Take metformin 1000 mg twice a day with meals Take 40 units of Lantus at bedtime This regimen should keep your blood sugar from dropping too low. When things get settled and you are not under as much stress, we will re-address adding another medicine for your diabetes.   #3 we have sent in a prescription for Bactrim DS. If you develop a yeast infection, you may fill the prescription for fluconazole and take as prescribed.  Return for followup appointment in 3-4 months.

## 2011-08-29 NOTE — Assessment & Plan Note (Signed)
Patient complaining of dysuria and strong smelling urine for approximately 3 weeks. Urine dipstick demonstrating positive glucose, small bilirubin, trace ketones, large blood, greater than 300 protein, 2 urobilinogen, positive for nitrates, small leukocytes. Denies abdominal pain or fever. Will prescribe Bactrim DS which patient reports is more effective than nitroglycerin for a total. In addition given her history of subsequent vaginal candidiasis following antibiotic treatment will prescribe fluconazole 150 mg x1

## 2011-08-29 NOTE — Progress Notes (Signed)
  Subjective:    Patient ID: Kayla [name], female    DOB: Sep 22, 1961, 50 y.o.   MRN: [id number]  HPI Patient presents to clinic with complaints of "another UTI". She describes discomfort on urination associated with strong smell for nearly 3 weeks. She denies fevers, chills, shortness of breath, chest pain, abdominal or vaginal pain. Her history is significant for diabetes, hypothyroidism, hyperlipidemia, sickle cell trait and several UTIs over the past year. Of note her fingerstick glucose was 46 today with increased to 52 after a cup of orange juice. She reports that she forgot to take her 40 units of Lantus the night prior and thus took 40 units of Lantus and 7 units of NovoLog this morning without eating. Her hemoglobin A1c today is 8.8 increased from 7.1 in August 2012. In addition she also reports that she takes metformin 1000 mg 3 times a day as opposed to the prescribed twice a day and glipizide 10 mg daily.   Review of Systems As per history of present illness otherwise    Objective:   Physical Exam  Constitutional: She is oriented to person, place, and time. She appears well-developed and well-nourished. No distress.  HENT:  Head: Normocephalic and atraumatic.  Neck: Normal range of motion. Neck supple. No thyromegaly present.  Cardiovascular: Normal rate, regular rhythm and normal heart sounds.   Pulmonary/Chest: Effort normal and breath sounds normal.  Abdominal: Soft. Bowel sounds are normal.  Neurological: She is alert and oriented to person, place, and time.  Skin: Skin is warm and dry.  Psychiatric: She has a normal mood and affect. Her behavior is normal. Judgment and thought content normal.          Assessment & Plan:  #1 hypoglycemia: Patient was given orange juice and Graham patch crackers which raised her glucose to 78 before leaving the clinic.  A review of her past glucose levels this appears to be a trend given episodes of hypoglycemia alternating with  hyperglycemia due to her mismanagement of her medications. Review of her anti-glycemic regimen she reports that she is then stockpiling medications just in case she needs to adjust her regimen and may not be able to afford medications at that time. She does not check her glucose levels at home but is adamant that she knows when her glucose is "out of whack" thus will treat herself according to her wishes. We agreed to we will stop the glipizide and only continue with the Lantus 40 units each bedtime in addition to metformin thousand milligrams twice a day. She reports that she prefers to take the metformin 1000 mg 3 times a day but will consider a hearing to be prescribed regimen.  I expressed that once things in her life has settled down and she is not under so much stress we will consider adding another agent for better control. Hemoglobin A1c today 8.8  #2 urinary tract infection: Prescribed Bactrim DS in addition to fluconazole 150 mg x1 for probable vaginal candidiasis if needed.

## 2011-08-29 NOTE — Assessment & Plan Note (Signed)
Lipid panel April 2012 with cholesterol 217, triglyceride 102, HDL 59, LDL 138 and given diabetic condition and hypertension 10 year risk of cardiovascular disease greater than 11%. Will start pravastatin 20 mg daily, will recheck lipid panel at f/u in 3-4 months.

## 2011-08-29 NOTE — Assessment & Plan Note (Addendum)
Poorly controlled,  patient initially with capillary blood glucose of 46 this morning she reports that she forgot to take her 40 units of Lantus last night thus took it this morning in addition to 7 units of aspart. She also reports that she is continue to take metform 1000 mg 3 times a day even though this was discontinued and was previously only prescribed bid.  She reports that she prefers the pills and only takes the insulin when she knows her "sugar is out of control".  Hemoglobin A1c 8.8. CBG 78 at time of discharge from the clinic today.  Plan to DC glipizide, continue Lantus 40 units each bedtime, patient reports that he does not want to discontinue use of insulin aspart, will re\re prescribe metformin 1000 mg twice a day although patient reports that she would prefer to use it 3 times a day.

## 2011-08-29 NOTE — Assessment & Plan Note (Signed)
Reports dysuria strong smell for over 2-1/2 weeks urine dipstick negative for glucose small bilirubin, trace ketones, large blood a, greater than 300 protein, 2.0 urobilinogen, positive for nitrites, small leukocytes. Patient reports nitroglycerin phenytoin was ineffective for her last UTI this will prescribe Bactrim DS today with Monistat since patient reports subsequent candidal infection after antibiotic therapy for her last UTI.

## 2011-12-20 ENCOUNTER — Other Ambulatory Visit: Payer: Self-pay | Admitting: Internal Medicine

## 2011-12-20 ENCOUNTER — Other Ambulatory Visit: Payer: Self-pay | Admitting: *Deleted

## 2011-12-20 DIAGNOSIS — G4700 Insomnia, unspecified: Secondary | ICD-10-CM

## 2011-12-20 DIAGNOSIS — E1165 Type 2 diabetes mellitus with hyperglycemia: Secondary | ICD-10-CM

## 2011-12-20 DIAGNOSIS — E785 Hyperlipidemia, unspecified: Secondary | ICD-10-CM

## 2011-12-21 ENCOUNTER — Other Ambulatory Visit: Payer: Self-pay | Admitting: Internal Medicine

## 2011-12-21 DIAGNOSIS — G4700 Insomnia, unspecified: Secondary | ICD-10-CM

## 2011-12-21 MED ORDER — ZOLPIDEM TARTRATE 10 MG PO TABS: 10.0000 mg | ORAL_TABLET | Freq: Every evening | ORAL | Status: DC | PRN

## 2011-12-24 NOTE — Telephone Encounter (Signed)
Rx called in to pharmacy. 

## 2012-01-15 ENCOUNTER — Encounter: Payer: Self-pay | Admitting: Internal Medicine

## 2012-03-04 ENCOUNTER — Encounter: Payer: Self-pay | Admitting: Internal Medicine

## 2012-03-04 ENCOUNTER — Ambulatory Visit (INDEPENDENT_AMBULATORY_CARE_PROVIDER_SITE_OTHER): Payer: Self-pay | Admitting: Internal Medicine

## 2012-03-04 ENCOUNTER — Other Ambulatory Visit: Payer: Self-pay | Admitting: Internal Medicine

## 2012-03-04 VITALS — BP 125/72 | HR 54 | Temp 98.2°F | Ht 62.0 in | Wt 171.3 lb

## 2012-03-04 DIAGNOSIS — E039 Hypothyroidism, unspecified: Secondary | ICD-10-CM

## 2012-03-04 DIAGNOSIS — E1142 Type 2 diabetes mellitus with diabetic polyneuropathy: Secondary | ICD-10-CM | POA: Insufficient documentation

## 2012-03-04 DIAGNOSIS — F329 Major depressive disorder, single episode, unspecified: Secondary | ICD-10-CM

## 2012-03-04 DIAGNOSIS — Z79899 Other long term (current) drug therapy: Secondary | ICD-10-CM

## 2012-03-04 DIAGNOSIS — R300 Dysuria: Secondary | ICD-10-CM

## 2012-03-04 DIAGNOSIS — G619 Inflammatory polyneuropathy, unspecified: Secondary | ICD-10-CM

## 2012-03-04 DIAGNOSIS — E1149 Type 2 diabetes mellitus with other diabetic neurological complication: Secondary | ICD-10-CM

## 2012-03-04 DIAGNOSIS — E119 Type 2 diabetes mellitus without complications: Secondary | ICD-10-CM

## 2012-03-04 DIAGNOSIS — H9190 Unspecified hearing loss, unspecified ear: Secondary | ICD-10-CM

## 2012-03-04 DIAGNOSIS — E1139 Type 2 diabetes mellitus with other diabetic ophthalmic complication: Secondary | ICD-10-CM

## 2012-03-04 DIAGNOSIS — E785 Hyperlipidemia, unspecified: Secondary | ICD-10-CM

## 2012-03-04 DIAGNOSIS — E1165 Type 2 diabetes mellitus with hyperglycemia: Secondary | ICD-10-CM

## 2012-03-04 DIAGNOSIS — H6120 Impacted cerumen, unspecified ear: Secondary | ICD-10-CM

## 2012-03-04 DIAGNOSIS — G4700 Insomnia, unspecified: Secondary | ICD-10-CM

## 2012-03-04 LAB — POCT GLYCOSYLATED HEMOGLOBIN (HGB A1C): Hemoglobin A1C: 6.3

## 2012-03-04 LAB — POCT URINALYSIS DIPSTICK
Bilirubin, UA: NEGATIVE
Blood, UA: NEGATIVE
Glucose, UA: NEGATIVE
Ketones, UA: NEGATIVE
Leukocytes, UA: NEGATIVE
Nitrite, UA: NEGATIVE
Protein, UA: >=300
Spec Grav, UA: 1.015
Urobilinogen, UA: 1
pH, UA: 6.5

## 2012-03-04 NOTE — Progress Notes (Signed)
Subjective:     Patient ID: Kayla [name], female   DOB: 11/25/1960, 51 y.o.   MRN: [id number]  HPI  Pt presents for f/u of diabetes with last HgbA1c 8.08 Aug 2011.  Has h/o mismanagement and stock piling of diabetes meds with hypoglycemic and hyperglycemic events.  Glipizide stopped in Nov 2012.  Should be on Lantus 40 units and metformin 1000mg  bid but only taking the insulin and no other medications for several reasons including recently moved, monetary, and other life stressors that she would not elaborate upon.   Review of Systems  Constitutional: Negative for fever and chills.  HENT: Positive for hearing loss.        Decreased hearing in right ear  Eyes: Negative for visual disturbance.  Respiratory: Negative for shortness of breath.   Cardiovascular: Negative for chest pain.  Genitourinary: Positive for dysuria.  Neurological: Negative for seizures and syncope.       Tingling of fingers and feet has increased since not taking Gabapentin       Objective:   Physical Exam  Constitutional: She is oriented to person, place, and time. She appears well-developed and well-nourished. No distress.  HENT:  Head: Normocephalic and atraumatic.  Right Ear: Hearing, tympanic membrane, external ear and ear canal normal.  Left Ear: Hearing, tympanic membrane, external ear and ear canal normal.       Partial obstruction of right EAM with cerumen  Eyes: Conjunctivae and EOM are normal. Pupils are equal, round, and reactive to light.  Neck: Normal range of motion. Neck supple.  Cardiovascular: Normal rate, regular rhythm, normal heart sounds and intact distal pulses.   No murmur heard. Pulmonary/Chest: Effort normal and breath sounds normal.  Abdominal: Soft. Bowel sounds are normal.  Musculoskeletal: Normal range of motion. She exhibits no edema.  Neurological: She is alert and oriented to person, place, and time.  Skin: Skin is warm and dry.       Assessment:     1. Diabetes Mellitus  with peripheral neuropathy: better control on Lantus 40 mg qhs and SSI 2. Hypothyroidim: not on levothyroxine currently 3. Hyperlipid: not on pravastatin 4. Insomnia: stable 5. Depression: stable 6. Dysuria: thinks she has a "yeast infection", urine dipstick with ++ protein, no bacteria or yeast noted 7. Excessive Cerumen: right EAM, likely contributing to ear fullness    Plan:     Cont Lantus 40 qhs,   Encourage glucometer use Diabetic foot exam, encourage resumption of gabapentin for neuropathy Check urine microalbumin/crear Check BMET Check TSH Check Lipids Check U/A Remove cerumen impaction of right ear per nursing Resume prior meds and reassess at 3 month follow-up

## 2012-03-04 NOTE — Progress Notes (Signed)
[facility] would not allow Dr Michail Sermon to sign the HM foot exam within her visit encounter so this orders only encounter was created to get credit for the foot exam Dr Michail Sermon and Regino Schultze performed.

## 2012-03-04 NOTE — Patient Instructions (Addendum)
Start taking all of your prescribed medicines again.  They are at your [facility]. If your medicines need to be changed or labs are abnormal we will contact you. You may use monistat over the counter. Return to see me in 3 months.

## 2012-03-05 DIAGNOSIS — H6120 Impacted cerumen, unspecified ear: Secondary | ICD-10-CM | POA: Insufficient documentation

## 2012-03-05 LAB — TSH: TSH: 163.689 u[IU]/mL — ABNORMAL HIGH (ref 0.350–4.500)

## 2012-03-05 LAB — BASIC METABOLIC PANEL
BUN: 11 mg/dL (ref 6–23)
CO2: 29 mEq/L (ref 19–32)
Calcium: 10.3 mg/dL (ref 8.4–10.5)
Chloride: 104 mEq/L (ref 96–112)
Creat: 1.24 mg/dL — ABNORMAL HIGH (ref 0.50–1.10)
Glucose, Bld: 123 mg/dL — ABNORMAL HIGH (ref 70–99)
Potassium: 3.8 mEq/L (ref 3.5–5.3)
Sodium: 140 mEq/L (ref 135–145)

## 2012-03-05 LAB — GLUCOSE, CAPILLARY: Glucose-Capillary: 132 mg/dL — ABNORMAL HIGH (ref 70–99)

## 2012-03-05 LAB — LIPID PANEL
Cholesterol: 220 mg/dL — ABNORMAL HIGH (ref 0–200)
HDL: 67 mg/dL (ref >39)
LDL Cholesterol: 127 mg/dL — ABNORMAL HIGH (ref 0–99)
Total CHOL/HDL Ratio: 3.3 Ratio
Triglycerides: 131 mg/dL (ref <150)
VLDL: 26 mg/dL (ref 0–40)

## 2012-03-05 LAB — MICROALBUMIN / CREATININE URINE RATIO
Creatinine, Urine: 198.5 mg/dL
Microalb Creat Ratio: 544.4 mg/g — ABNORMAL HIGH (ref 0.0–30.0)
Microalb, Ur: 108.06 mg/dL — ABNORMAL HIGH (ref 0.00–1.89)

## 2012-03-05 LAB — T4, FREE: Free T4: 0.28 ng/dL — ABNORMAL LOW (ref 0.80–1.80)

## 2012-03-23 ENCOUNTER — Telehealth: Payer: Self-pay | Admitting: *Deleted

## 2012-03-25 ENCOUNTER — Encounter: Payer: Self-pay | Admitting: Internal Medicine

## 2012-03-25 ENCOUNTER — Inpatient Hospital Stay: Admission: RE | Admit: 2012-03-25 | Payer: Self-pay | Source: Ambulatory Visit

## 2012-03-25 ENCOUNTER — Ambulatory Visit (INDEPENDENT_AMBULATORY_CARE_PROVIDER_SITE_OTHER): Payer: Self-pay | Admitting: Internal Medicine

## 2012-03-25 ENCOUNTER — Other Ambulatory Visit (HOSPITAL_COMMUNITY): Admission: RE | Admit: 2012-03-25 | Payer: Self-pay | Source: Ambulatory Visit | Admitting: Internal Medicine

## 2012-03-25 VITALS — BP 134/69 | HR 65 | Temp 97.7°F | Ht 62.0 in | Wt 169.6 lb

## 2012-03-25 DIAGNOSIS — L750 Bromhidrosis: Secondary | ICD-10-CM | POA: Insufficient documentation

## 2012-03-25 DIAGNOSIS — E119 Type 2 diabetes mellitus without complications: Secondary | ICD-10-CM

## 2012-03-25 DIAGNOSIS — N899 Noninflammatory disorder of vagina, unspecified: Secondary | ICD-10-CM

## 2012-03-25 DIAGNOSIS — E139 Other specified diabetes mellitus without complications: Secondary | ICD-10-CM

## 2012-03-25 LAB — GLUCOSE, CAPILLARY: Glucose-Capillary: 51 mg/dL — ABNORMAL LOW (ref 70–99)

## 2012-03-25 NOTE — Progress Notes (Signed)
Subjective:     Patient ID: Kayla [name], female   DOB: [birth year], 51 y.o.   MRN: [id number]  HPI Patient is a 51-year-old woman with diabetes, hypertension, hyperlipidemia who presents with urinary odor.  Patient says she's having odor in her urine and vagina for the past 4 weeks. She was seen in the office on 03/04/12 with a negative urine dipstick. She says this odor similar to when she has bladder infections in the past. She denies any dysuria or urinary frequency. She denies any hematuria, abdominal pain, pruritus.  Review of Systems Patient not taking statin    Objective:   Physical Exam Gen: NAD Abd: soft, NT/ND Gu: normal external genatalia, white discharge in vault, nl appearing cervix    Assessment:         Plan:

## 2012-03-25 NOTE — Progress Notes (Signed)
CBG- 51 and asymptomatic; pt states she has not eaten since 8PM yesterday. Has candy bar w/her and will eat breakfast when she leaves the clinic.  MD aware.

## 2012-03-25 NOTE — Assessment & Plan Note (Addendum)
Patient with 3-4 weeks of urinary odor and vaginal odor. Differential includes UTI, BV, yeast infection. Of note, the patient's urine dip in the office on 6/5 did not indicate infection. - UA - Urine culture - Wet prep - GC/CT - Call patient with treatment plan based on results

## 2012-03-26 LAB — URINALYSIS, MICROSCOPIC ONLY
Casts: NONE SEEN
Crystals: NONE SEEN
WBC, UA: >50 WBC/hpf — AB (ref <3)

## 2012-03-26 LAB — URINALYSIS, ROUTINE W REFLEX MICROSCOPIC
Bilirubin Urine: NEGATIVE
Glucose, UA: NEGATIVE mg/dL
Hgb urine dipstick: NEGATIVE
Ketones, ur: NEGATIVE mg/dL
Nitrite: POSITIVE — AB
Protein, ur: 100 mg/dL — AB
Specific Gravity, Urine: 1.015 (ref 1.005–1.030)
Urobilinogen, UA: 1 mg/dL (ref 0.0–1.0)
pH: 6.5 (ref 5.0–8.0)

## 2012-03-27 ENCOUNTER — Telehealth: Payer: Self-pay | Admitting: Internal Medicine

## 2012-03-27 LAB — URINE CULTURE: Colony Count: 40000

## 2012-03-27 NOTE — Telephone Encounter (Signed)
Pt informed and still c/o slight sweet odor from vaginal area.  No other c/o. She will call with any changes. She did want the results of TSH test and I told her her doctor would call with those.

## 2012-03-27 NOTE — Telephone Encounter (Signed)
Pt saw Dr Tressie Stalker last week for urine and vaginal odor. Wet prep and GC / CT were negative. UA was not clean catch. Urine cx not diagnostic - only 40,000 colonies. I tried to call pt to F/U on sxs and got answering machine. Since her sxs were atypical and W/U negative, no tx for now. If yo uare able to reach her, would you pls ask her about her sxs? Thanks

## 2012-03-30 ENCOUNTER — Other Ambulatory Visit: Payer: Self-pay | Admitting: Internal Medicine

## 2012-03-30 DIAGNOSIS — E039 Hypothyroidism, unspecified: Secondary | ICD-10-CM

## 2012-03-30 MED ORDER — LEVOTHYROXINE SODIUM 75 MCG PO TABS: 75.0000 ug | ORAL_TABLET | Freq: Every day | ORAL | Status: DC

## 2012-03-30 NOTE — Telephone Encounter (Signed)
Dr Michail Sermon ordered TSH at her last visit 6/5 and TSH was elevated but I cannot determine what Dr Michail Sermon planned to do with the result - synthroid is not on her med list but does have h/o hypothyroidism. Will forward to Dr Michail Sermon to F?U.

## 2012-03-31 ENCOUNTER — Telehealth: Payer: Self-pay | Admitting: *Deleted

## 2012-03-31 NOTE — Telephone Encounter (Signed)
Call to pt to inform her of need to restart her Synthroid.  Message left for pt to call the Clinics and to ask for [facility].  Sander Nephew, RN 03/31/2012 9:06 AM.

## 2012-04-01 ENCOUNTER — Telehealth: Payer: Self-pay | Admitting: *Deleted

## 2012-04-01 NOTE — Telephone Encounter (Signed)
[facility] call to patient x2.   Spoke with patient given information to restart her Synthroid 75 mcg.  Pt said that se has tried Monistat with no relief from her itching would like to have Diflucan sent to her pharmacy.  Pt uses [facility] in [place].  Sander Nephew ,RN 04/01/2012  4:09 PM

## 2012-04-18 IMAGING — US US PELVIS COMPLETE MODIFY
1 series · 14 of 25 positions shown · non-contrast
Comparison: None.

CLINICAL DATA: Pelvic pain

TRANSABDOMINAL AND TRANSVAGINAL ULTRASOUND OF PELVIS
TECHNIQUE: Both transabdominal and transvaginal ultrasound
examinations of the pelvis were performed including evaluation of
the uterus, ovaries, adnexal regions, and pelvic cul-de-sac.

[Series 1: us transvaginal non-ob · 14 of 70 slices shown]
[im 1/70]
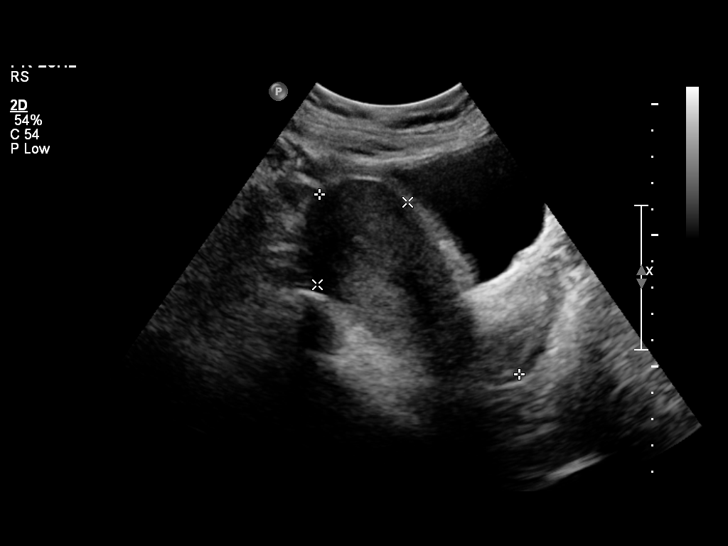
[im 6/70]
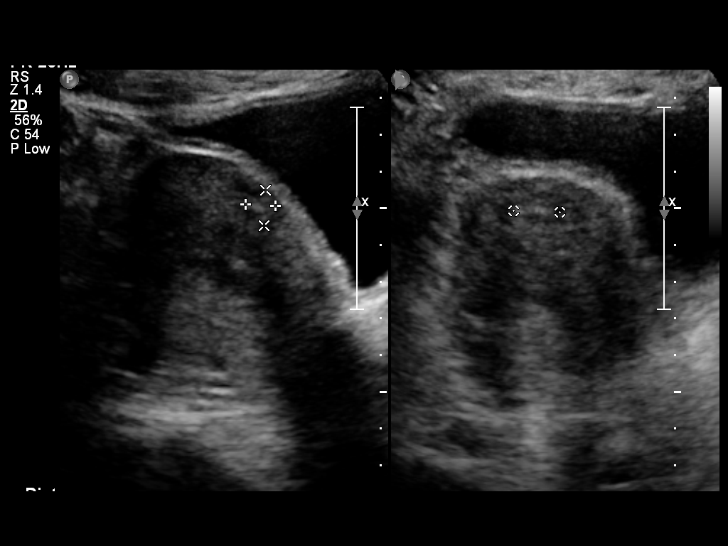
[im 12/70]
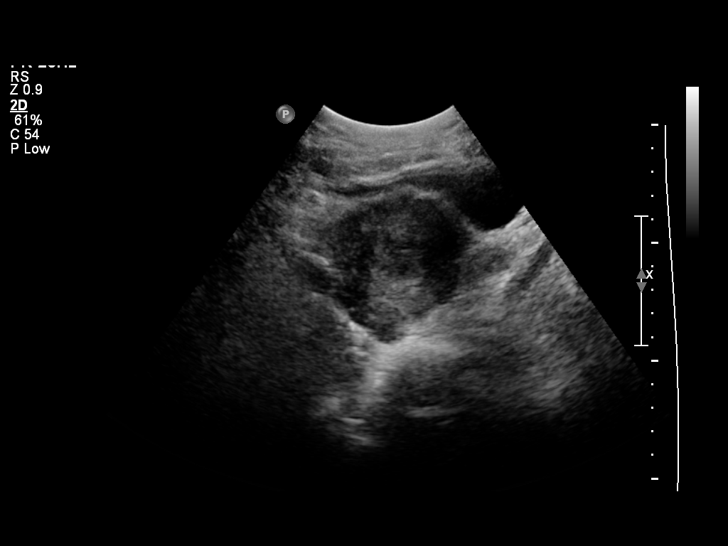
[im 18/70]
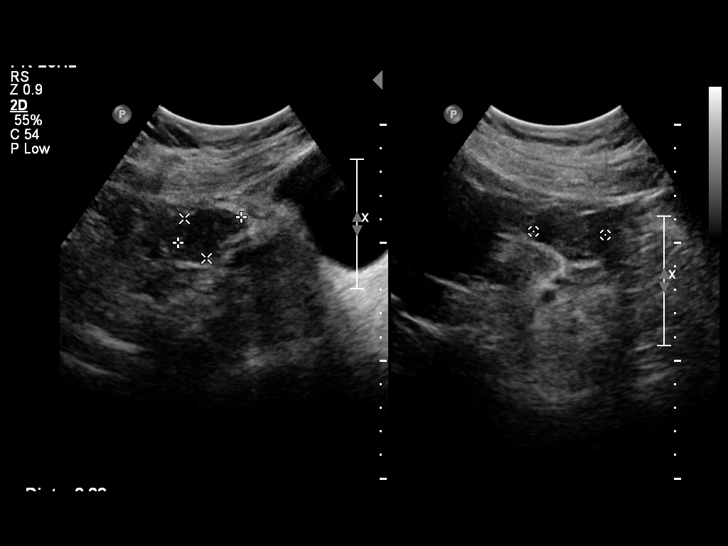
[im 24/70]
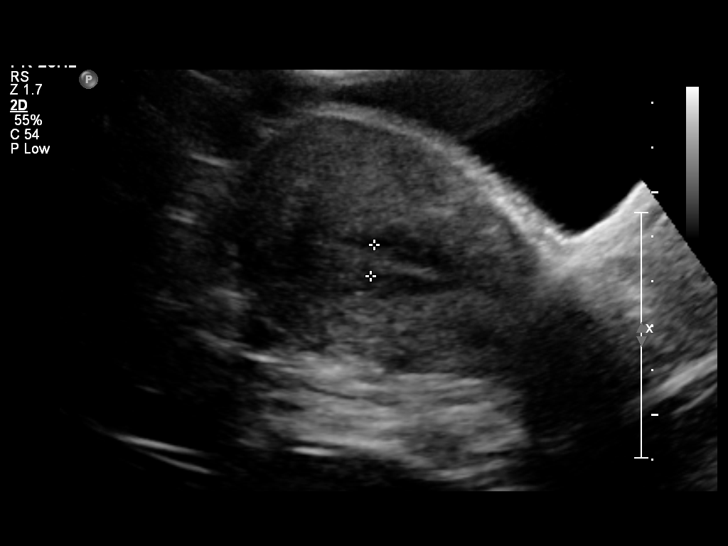
[im 26/70]
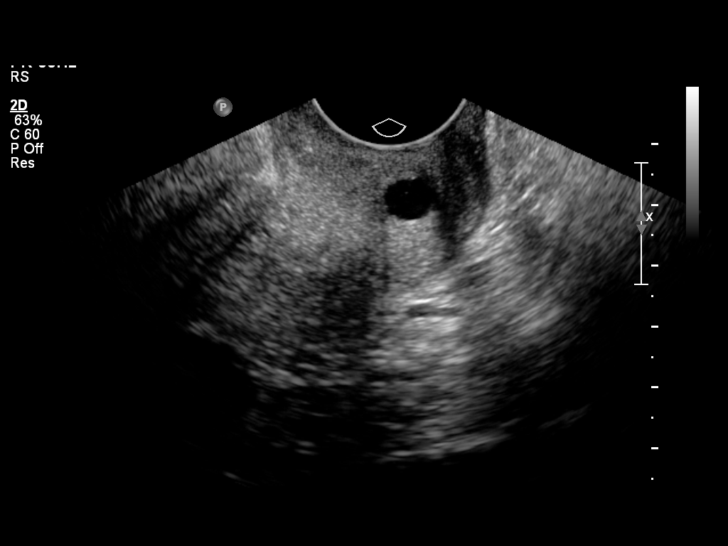
[im 32/70]
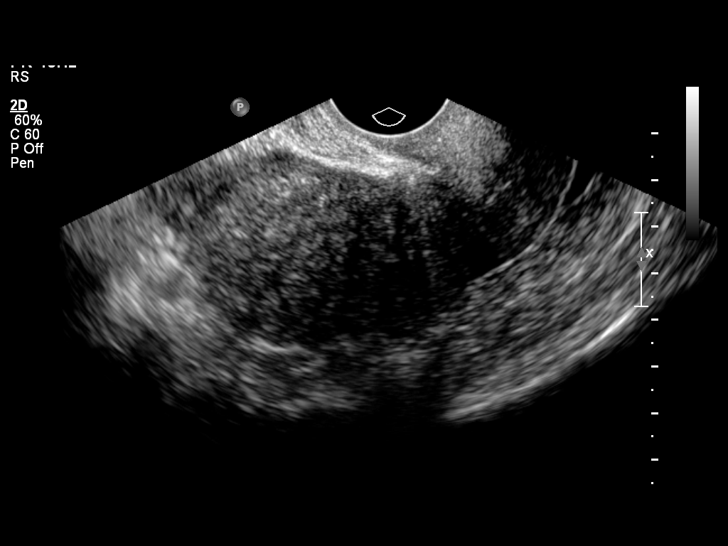
[im 38/70]
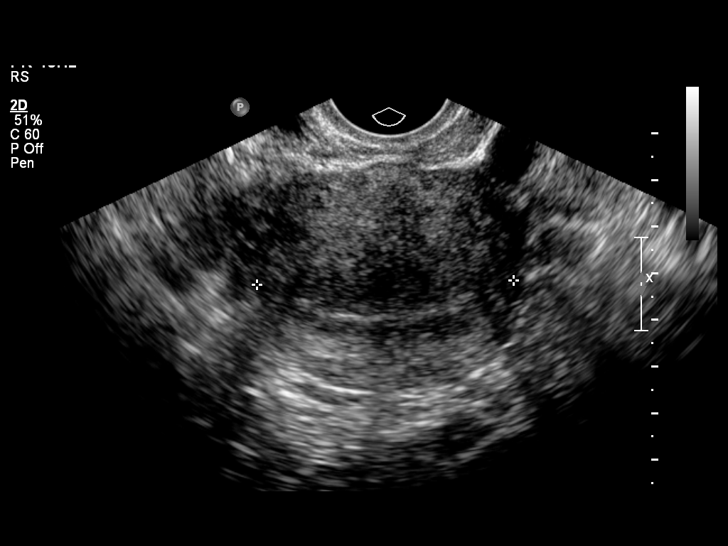
[im 44/70]
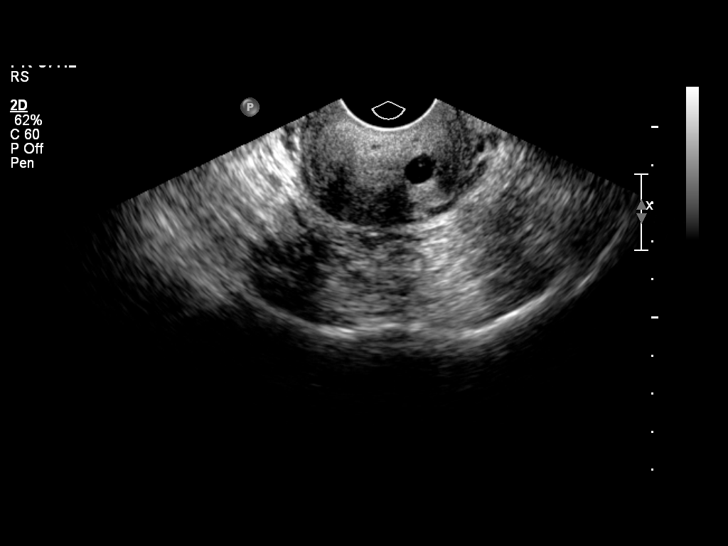
[im 47/70]
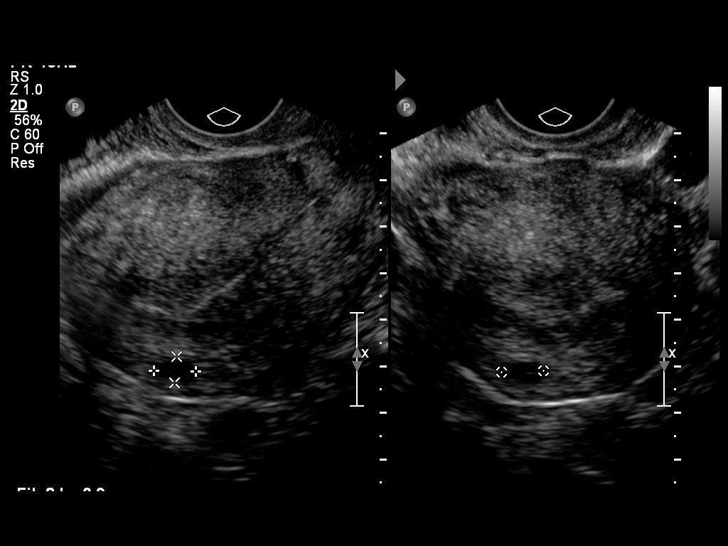
[im 52/70]
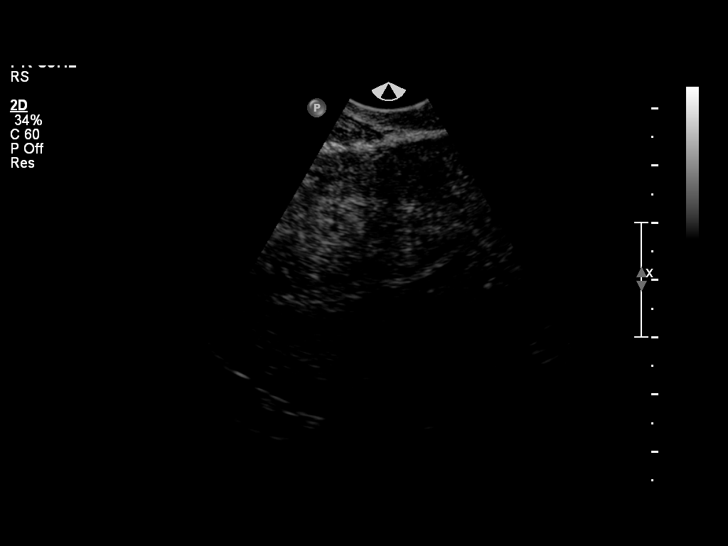
[im 58/70]
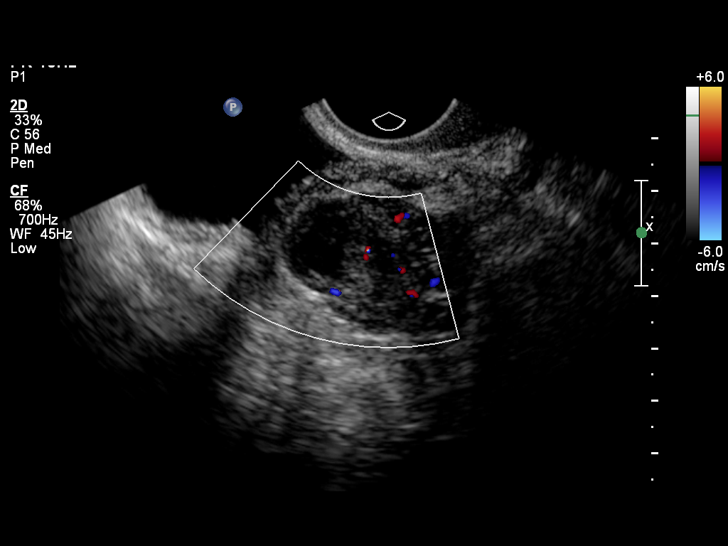
[im 64/70]
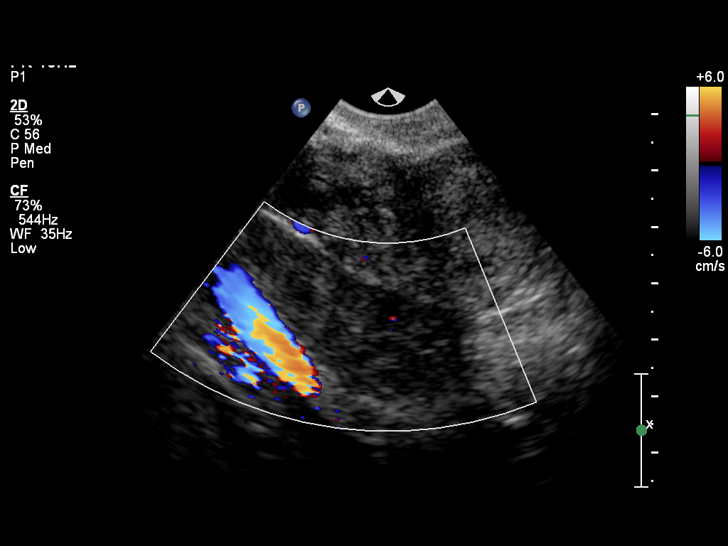
[im 70/70]
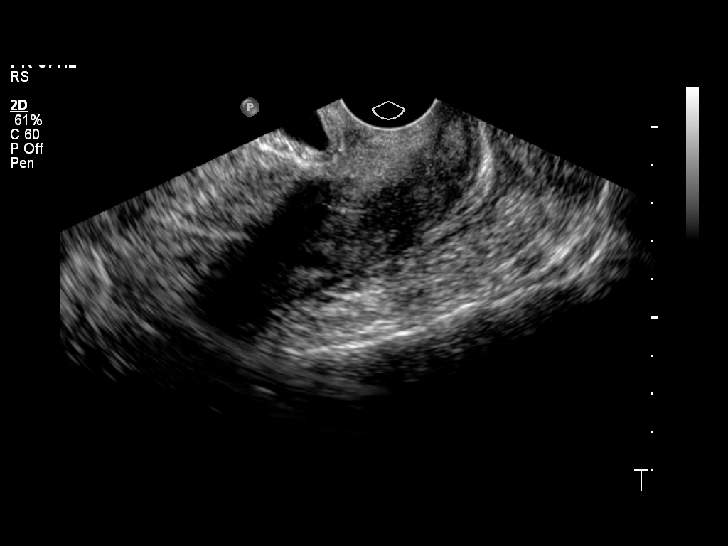

[14 of 25 positions shown; findings below may reference images not displayed]

FINDINGS: The uterus is normal in overall size, measuring 10.3 x
5.4 x 5.5 cm.  The endometrial stripe is thin and homogeneous,
measuring 7 mm in width.  The myometrial echotexture is
heterogeneous, likely related to an element of adenomyosis.  There
are several tiny myometrial fibroids, all measuring less than 1 cm.
Both ovaries have a normal size and appearance.  The left ovary
measures 2.3 x 2.5 x 3.3 cm, and the right ovary measures 3.2 x
x 3.2 cm.  There are no adnexal masses or free pelvic fluid.
IMPRESSION: The myometrial echotexture is heterogeneous, suggesting an element
of adenomyosis.  There also several sub centimeter myometrial
fibroids.

## 2012-07-21 ENCOUNTER — Ambulatory Visit (INDEPENDENT_AMBULATORY_CARE_PROVIDER_SITE_OTHER): Payer: Self-pay | Admitting: Internal Medicine

## 2012-07-21 ENCOUNTER — Encounter: Payer: Self-pay | Admitting: Licensed Clinical Social Worker

## 2012-07-21 ENCOUNTER — Encounter: Payer: Self-pay | Admitting: Internal Medicine

## 2012-07-21 VITALS — BP 132/82 | HR 69 | Temp 98.1°F | Ht 62.0 in | Wt 166.8 lb

## 2012-07-21 DIAGNOSIS — F329 Major depressive disorder, single episode, unspecified: Secondary | ICD-10-CM

## 2012-07-21 DIAGNOSIS — Z79899 Other long term (current) drug therapy: Secondary | ICD-10-CM

## 2012-07-21 DIAGNOSIS — M797 Fibromyalgia: Secondary | ICD-10-CM

## 2012-07-21 DIAGNOSIS — M792 Neuralgia and neuritis, unspecified: Secondary | ICD-10-CM

## 2012-07-21 DIAGNOSIS — G619 Inflammatory polyneuropathy, unspecified: Secondary | ICD-10-CM

## 2012-07-21 DIAGNOSIS — F3289 Other specified depressive episodes: Secondary | ICD-10-CM

## 2012-07-21 DIAGNOSIS — D649 Anemia, unspecified: Secondary | ICD-10-CM

## 2012-07-21 DIAGNOSIS — H9209 Otalgia, unspecified ear: Secondary | ICD-10-CM

## 2012-07-21 DIAGNOSIS — E119 Type 2 diabetes mellitus without complications: Secondary | ICD-10-CM

## 2012-07-21 DIAGNOSIS — E1165 Type 2 diabetes mellitus with hyperglycemia: Secondary | ICD-10-CM

## 2012-07-21 DIAGNOSIS — E785 Hyperlipidemia, unspecified: Secondary | ICD-10-CM

## 2012-07-21 DIAGNOSIS — E1142 Type 2 diabetes mellitus with diabetic polyneuropathy: Secondary | ICD-10-CM

## 2012-07-21 DIAGNOSIS — G4700 Insomnia, unspecified: Secondary | ICD-10-CM

## 2012-07-21 DIAGNOSIS — E039 Hypothyroidism, unspecified: Secondary | ICD-10-CM

## 2012-07-21 LAB — CBC
HCT: 34.7 % — ABNORMAL LOW (ref 36.0–46.0)
Hemoglobin: 11.9 g/dL — ABNORMAL LOW (ref 12.0–15.0)
MCH: 32.5 pg (ref 26.0–34.0)
MCHC: 34.3 g/dL (ref 30.0–36.0)
MCV: 94.8 fL (ref 78.0–100.0)
Platelets: 114 10*3/uL — ABNORMAL LOW (ref 150–400)
RBC: 3.66 MIL/uL — ABNORMAL LOW (ref 3.87–5.11)
RDW: 13.7 % (ref 11.5–15.5)
WBC: 6.6 10*3/uL (ref 4.0–10.5)

## 2012-07-21 LAB — BASIC METABOLIC PANEL
BUN: 10 mg/dL (ref 6–23)
CO2: 27 mEq/L (ref 19–32)
Calcium: 9.9 mg/dL (ref 8.4–10.5)
Chloride: 100 mEq/L (ref 96–112)
Creat: 1.27 mg/dL — ABNORMAL HIGH (ref 0.50–1.10)
Glucose, Bld: 270 mg/dL — ABNORMAL HIGH (ref 70–99)
Potassium: 4 mEq/L (ref 3.5–5.3)
Sodium: 135 mEq/L (ref 135–145)

## 2012-07-21 LAB — TSH: TSH: 107.325 u[IU]/mL — ABNORMAL HIGH (ref 0.350–4.500)

## 2012-07-21 LAB — POCT GLYCOSYLATED HEMOGLOBIN (HGB A1C): Hemoglobin A1C: 11.8

## 2012-07-21 LAB — GLUCOSE, CAPILLARY: Glucose-Capillary: 308 mg/dL — ABNORMAL HIGH (ref 70–99)

## 2012-07-21 LAB — SEDIMENTATION RATE: Sed Rate: 15 mm/hr (ref 0–22)

## 2012-07-21 MED ORDER — INSULIN GLARGINE 100 UNIT/ML ~~LOC~~ SOLN: SUBCUTANEOUS | Status: DC

## 2012-07-21 MED ORDER — INSULIN ASPART 100 UNIT/ML ~~LOC~~ SOLN: SUBCUTANEOUS | Status: DC

## 2012-07-21 MED ORDER — AMITRIPTYLINE HCL 150 MG PO TABS: 150.0000 mg | ORAL_TABLET | Freq: Every day | ORAL | Status: DC

## 2012-07-21 MED ORDER — METFORMIN HCL 1000 MG PO TABS: 1000.0000 mg | ORAL_TABLET | Freq: Two times a day (BID) | ORAL | Status: DC

## 2012-07-21 MED ORDER — LEVOTHYROXINE SODIUM 75 MCG PO TABS: 75.0000 ug | ORAL_TABLET | Freq: Every day | ORAL | Status: DC

## 2012-07-21 NOTE — Patient Instructions (Addendum)
Fill your medications and take them as prescribed. The amitriptyline should help with the fibromyalgia but will make you drowsy as before. In the future, we may try different medications such as Cymbalta or Effexor which cost more. Return to clinic in 3-4 months for a recheck.

## 2012-07-21 NOTE — Progress Notes (Unsigned)
Kayla [name] was referred to CSW for mental health referrals.  Kayla [name] has dx of Fibromyalgia and Depression.  Kayla [name] is currently unemployed and has gone through job training without success.  Kayla [name] is in the process of applying for Disability, Kayla [name]'s interview is in Nov 2013.  Kayla [name] has renewed her [place] today.  CSW explained benefits of [employer] and ability to obtain Rx for $6.  Kayla [name] has financial hardship where she can not afford her medications.  Kayla [name] states "even if it were $1, I couldn't afford it".  CSW provided Kayla [name] with information to [facility] and encouraged Kayla [name] to apply to their program, as oftentimes some medications can be obtained for free.  Several of Kayla [name]' medications can be obtained for $4 at [facility], Kayla [name] provided with $4 med listing per her request.  Lastly, Kayla [name] has an Accu-chek but the battery is currently dead.  CSW had $9 of donated funds available and provided to Kayla [name].  CSW discussed mental health services available in the area.  Once Kayla [name] has obtains her medications, CSW encouraged Kayla [name] to explore [place], [employer] or [employer] of the [place] for counseling/psychiatry services.  Kayla [name] informed Kayla [name] has pharmacy that can assist with mental health prescriptions in building.

## 2012-07-21 NOTE — Progress Notes (Signed)
  Subjective:    Patient ID: Kayla [name], female    DOB: 09-04-1961, 51 y.o.   MRN: [id number]  HPI Hx significant poorly controlled DM Type2 with neuropathy, hypothyroidism,and depression who presents for follow-up. She has been out of medicications for well over a month.  Complaining of right ear discomfort (something rolling in her ear) when she changes positions.  C/o diffuse nonspecific pain and fatigue. Denies fever, chest pain, shortness of breath, change in bowel habits, anxiety or  increased depression.  States that she is unemployed which is contributing to her stress.   Review of Systems  Constitutional: Positive for fatigue. Negative for fever.  HENT: Positive for ear pain and neck pain. Negative for congestion and sinus pressure.   Respiratory: Negative for cough and shortness of breath.   Cardiovascular: Negative for chest pain, palpitations and leg swelling.  Gastrointestinal: Negative for nausea, abdominal pain, diarrhea and blood in stool.  Genitourinary: Negative for vaginal discharge.  Musculoskeletal: Positive for myalgias.  Skin: Negative for rash.  Neurological: Negative for dizziness, weakness, numbness and headaches.  Psychiatric/Behavioral: Positive for dysphoric mood. Negative for confusion and agitation.       Objective:   Physical Exam  Constitutional: She is oriented to person, place, and time. She appears well-developed and well-nourished. No distress.  HENT:  Head: Normocephalic and atraumatic.  Eyes: Conjunctivae normal and EOM are normal. Pupils are equal, round, and reactive to light.  Neck: Normal range of motion. Neck supple. No thyromegaly present.  Cardiovascular: Normal rate and regular rhythm.   Murmur heard. Pulmonary/Chest: Effort normal and breath sounds normal.  Musculoskeletal: Normal range of motion. She exhibits edema.       Trace bilateral LE edema  Neurological: She is alert and oriented to person, place, and time.  Skin: Skin is warm  and dry.  Psychiatric: Her speech is normal and behavior is normal. Judgment and thought content normal. Her affect is blunt. Cognition and memory are normal. She exhibits a depressed mood.          Assessment & Plan:  1. Fibromyalgia likely: cant afford SSNI or Effexor: Bilateral symmetry of pain with tenderness to palpation at [facility], Costochondral jxn, Lateral epicondyle, Medial knee, Nape of neck, and trapezius. -check CBC to r/o anemia -check ESR r/o inflammation (not elevated with fibromyalgia) -Check BMET and TSH -resume Amitriptyline 150 mg qhs -SW consult for available resources and possible cognitive behavioral therapy  2. DM2: poorly controlled HgbA1c 11.8, hasnt been on Metformin nor Lantus in over 1 month -samples of Lantus and Novolog given today along with prescription  3. Hypothyroidism: restart Synthroid, check TSH today  4. Ear discomfort: likely secondary to otoliths, pt w/o complaints of dizziness or vertigo to suggest BPPV -cont to monitor, no insurance thus will defer ENT assessment until [employer]

## 2012-07-23 ENCOUNTER — Other Ambulatory Visit: Payer: Self-pay | Admitting: Internal Medicine

## 2012-07-23 DIAGNOSIS — E1165 Type 2 diabetes mellitus with hyperglycemia: Secondary | ICD-10-CM

## 2012-07-23 DIAGNOSIS — E119 Type 2 diabetes mellitus without complications: Secondary | ICD-10-CM

## 2012-07-23 DIAGNOSIS — E1142 Type 2 diabetes mellitus with diabetic polyneuropathy: Secondary | ICD-10-CM

## 2012-07-23 MED ORDER — INSULIN ASPART 100 UNIT/ML ~~LOC~~ SOLN: SUBCUTANEOUS | Status: DC

## 2012-07-23 MED ORDER — INSULIN GLARGINE 100 UNIT/ML ~~LOC~~ SOLN: SUBCUTANEOUS | Status: DC

## 2012-08-17 ENCOUNTER — Telehealth: Payer: Self-pay | Admitting: *Deleted

## 2012-08-17 ENCOUNTER — Observation Stay (HOSPITAL_COMMUNITY)
Admission: AD | Admit: 2012-08-17 | Discharge: 2012-08-18 | DRG: 556 | Disposition: A | Discharge status: Home / Self Care | Payer: MEDICAID | Source: Ambulatory Visit | Attending: Internal Medicine | Admitting: Internal Medicine

## 2012-08-17 ENCOUNTER — Ambulatory Visit (INDEPENDENT_AMBULATORY_CARE_PROVIDER_SITE_OTHER): Payer: Self-pay | Admitting: Internal Medicine

## 2012-08-17 ENCOUNTER — Encounter: Payer: Self-pay | Admitting: Internal Medicine

## 2012-08-17 ENCOUNTER — Inpatient Hospital Stay (HOSPITAL_COMMUNITY): Payer: Self-pay

## 2012-08-17 ENCOUNTER — Encounter (HOSPITAL_COMMUNITY): Payer: Self-pay | Admitting: Internal Medicine

## 2012-08-17 VITALS — BP 139/85 | HR 77 | Temp 99.1°F | Wt 171.4 lb

## 2012-08-17 DIAGNOSIS — E785 Hyperlipidemia, unspecified: Secondary | ICD-10-CM | POA: Diagnosis present

## 2012-08-17 DIAGNOSIS — E1140 Type 2 diabetes mellitus with diabetic neuropathy, unspecified: Secondary | ICD-10-CM | POA: Diagnosis present

## 2012-08-17 DIAGNOSIS — E119 Type 2 diabetes mellitus without complications: Secondary | ICD-10-CM

## 2012-08-17 DIAGNOSIS — G4700 Insomnia, unspecified: Secondary | ICD-10-CM

## 2012-08-17 DIAGNOSIS — E669 Obesity, unspecified: Secondary | ICD-10-CM | POA: Insufficient documentation

## 2012-08-17 DIAGNOSIS — R42 Dizziness and giddiness: Secondary | ICD-10-CM

## 2012-08-17 DIAGNOSIS — E1149 Type 2 diabetes mellitus with other diabetic neurological complication: Secondary | ICD-10-CM

## 2012-08-17 DIAGNOSIS — R296 Repeated falls: Secondary | ICD-10-CM

## 2012-08-17 DIAGNOSIS — IMO0001 Reserved for inherently not codable concepts without codable children: Secondary | ICD-10-CM | POA: Insufficient documentation

## 2012-08-17 DIAGNOSIS — F32A Depression, unspecified: Secondary | ICD-10-CM | POA: Diagnosis present

## 2012-08-17 DIAGNOSIS — W19XXXA Unspecified fall, initial encounter: Secondary | ICD-10-CM

## 2012-08-17 DIAGNOSIS — R6889 Other general symptoms and signs: Secondary | ICD-10-CM

## 2012-08-17 DIAGNOSIS — E1142 Type 2 diabetes mellitus with diabetic polyneuropathy: Secondary | ICD-10-CM | POA: Insufficient documentation

## 2012-08-17 DIAGNOSIS — R7989 Other specified abnormal findings of blood chemistry: Secondary | ICD-10-CM | POA: Insufficient documentation

## 2012-08-17 DIAGNOSIS — D649 Anemia, unspecified: Secondary | ICD-10-CM | POA: Diagnosis present

## 2012-08-17 DIAGNOSIS — Z9181 History of falling: Secondary | ICD-10-CM | POA: Insufficient documentation

## 2012-08-17 DIAGNOSIS — F329 Major depressive disorder, single episode, unspecified: Secondary | ICD-10-CM

## 2012-08-17 DIAGNOSIS — M792 Neuralgia and neuritis, unspecified: Secondary | ICD-10-CM

## 2012-08-17 DIAGNOSIS — IMO0002 Reserved for concepts with insufficient information to code with codable children: Secondary | ICD-10-CM

## 2012-08-17 DIAGNOSIS — E162 Hypoglycemia, unspecified: Secondary | ICD-10-CM

## 2012-08-17 DIAGNOSIS — E039 Hypothyroidism, unspecified: Secondary | ICD-10-CM

## 2012-08-17 DIAGNOSIS — M797 Fibromyalgia: Secondary | ICD-10-CM | POA: Diagnosis present

## 2012-08-17 DIAGNOSIS — H9190 Unspecified hearing loss, unspecified ear: Secondary | ICD-10-CM

## 2012-08-17 DIAGNOSIS — Z658 Other specified problems related to psychosocial circumstances: Secondary | ICD-10-CM

## 2012-08-17 DIAGNOSIS — R87619 Unspecified abnormal cytological findings in specimens from cervix uteri: Secondary | ICD-10-CM

## 2012-08-17 DIAGNOSIS — H6120 Impacted cerumen, unspecified ear: Secondary | ICD-10-CM

## 2012-08-17 DIAGNOSIS — Z0000 Encounter for general adult medical examination without abnormal findings: Secondary | ICD-10-CM

## 2012-08-17 DIAGNOSIS — E890 Postprocedural hypothyroidism: Secondary | ICD-10-CM | POA: Diagnosis present

## 2012-08-17 HISTORY — DX: Type 2 diabetes mellitus without complications: E11.9

## 2012-08-17 HISTORY — DX: Sickle-cell trait: D57.3

## 2012-08-17 HISTORY — DX: Hyperlipidemia, unspecified: E78.5

## 2012-08-17 HISTORY — DX: Dizziness and giddiness: R42

## 2012-08-17 HISTORY — DX: Repeated falls: R29.6

## 2012-08-17 HISTORY — DX: Hypothyroidism, unspecified: E03.9

## 2012-08-17 HISTORY — DX: Fibromyalgia: M79.7

## 2012-08-17 LAB — GLUCOSE, CAPILLARY
Glucose-Capillary: 56 mg/dL — ABNORMAL LOW (ref 70–99)
Glucose-Capillary: 66 mg/dL — ABNORMAL LOW (ref 70–99)
Glucose-Capillary: 75 mg/dL (ref 70–99)
Glucose-Capillary: 128 mg/dL — ABNORMAL HIGH (ref 70–99)
Glucose-Capillary: 171 mg/dL — ABNORMAL HIGH (ref 70–99)
Glucose-Capillary: 184 mg/dL — ABNORMAL HIGH (ref 70–99)

## 2012-08-17 LAB — COMPREHENSIVE METABOLIC PANEL
ALT: 173 U/L — ABNORMAL HIGH (ref 0–35)
AST: 191 U/L — ABNORMAL HIGH (ref 0–37)
Albumin: 3.6 g/dL (ref 3.5–5.2)
Alkaline Phosphatase: 227 U/L — ABNORMAL HIGH (ref 39–117)
BUN: 14 mg/dL (ref 6–23)
CO2: 29 mEq/L (ref 19–32)
Calcium: 10.5 mg/dL (ref 8.4–10.5)
Chloride: 98 mEq/L (ref 96–112)
Creat: 1.02 mg/dL (ref 0.50–1.10)
Glucose, Bld: 85 mg/dL (ref 70–99)
Potassium: 4 mEq/L (ref 3.5–5.3)
Sodium: 136 mEq/L (ref 135–145)
Total Bilirubin: 2 mg/dL — ABNORMAL HIGH (ref 0.3–1.2)
Total Protein: 7.8 g/dL (ref 6.0–8.3)

## 2012-08-17 LAB — CBC WITH DIFFERENTIAL/PLATELET
Basophils Absolute: 0.1 10*3/uL (ref 0.0–0.1)
Basophils Relative: 1 % (ref 0–1)
Eosinophils Absolute: 0.2 10*3/uL (ref 0.0–0.7)
Eosinophils Relative: 2 % (ref 0–5)
HCT: 31.2 % — ABNORMAL LOW (ref 36.0–46.0)
Hemoglobin: 11.3 g/dL — ABNORMAL LOW (ref 12.0–15.0)
Lymphocytes Relative: 22 % (ref 12–46)
Lymphs Abs: 2 10*3/uL (ref 0.7–4.0)
MCH: 34.2 pg — ABNORMAL HIGH (ref 26.0–34.0)
MCHC: 36.2 g/dL — ABNORMAL HIGH (ref 30.0–36.0)
MCV: 94.5 fL (ref 78.0–100.0)
Monocytes Absolute: 0.4 10*3/uL (ref 0.1–1.0)
Monocytes Relative: 5 % (ref 3–12)
Neutro Abs: 6.2 10*3/uL (ref 1.7–7.7)
Neutrophils Relative %: 70 % (ref 43–77)
Platelets: 154 10*3/uL (ref 150–400)
RBC: 3.3 MIL/uL — ABNORMAL LOW (ref 3.87–5.11)
RDW: 12.8 % (ref 11.5–15.5)
WBC: 8.9 10*3/uL (ref 4.0–10.5)

## 2012-08-17 LAB — BLOOD GAS, ARTERIAL
Acid-Base Excess: 3.2 mmol/L — ABNORMAL HIGH (ref 0.0–2.0)
Bicarbonate: 27.7 mEq/L — ABNORMAL HIGH (ref 20.0–24.0)
Drawn by: 318431
O2 Content: 2 L/min
O2 Saturation: 98.7 %
Patient temperature: 98.6
TCO2: 29.2 mmol/L (ref 0–100)
pCO2 arterial: 46.6 mmHg — ABNORMAL HIGH (ref 35.0–45.0)
pH, Arterial: 7.392 (ref 7.350–7.450)
pO2, Arterial: 93.8 mmHg (ref 80.0–100.0)

## 2012-08-17 LAB — T4, FREE: Free T4: 0.22 ng/dL — ABNORMAL LOW (ref 0.80–1.80)

## 2012-08-17 LAB — LIPASE, BLOOD: Lipase: 67 U/L — ABNORMAL HIGH (ref 11–59)

## 2012-08-17 LAB — GAMMA GT: GGT: 1019 U/L — ABNORMAL HIGH (ref 7–51)

## 2012-08-17 LAB — TSH: TSH: 134.166 u[IU]/mL — ABNORMAL HIGH (ref 0.350–4.500)

## 2012-08-17 LAB — PROTIME-INR
INR: 1 (ref 0.00–1.49)
Prothrombin Time: 13.1 seconds (ref 11.6–15.2)

## 2012-08-17 MED ORDER — AMITRIPTYLINE HCL 75 MG PO TABS: 150.0000 mg | ORAL_TABLET | Freq: Every day | ORAL | Status: DC

## 2012-08-17 MED ORDER — SODIUM CHLORIDE 0.9 % IJ SOLN
3.0000 mL | Freq: Two times a day (BID) | INTRAMUSCULAR | Status: DC
  Administered 2012-08-18: 3 mL via INTRAVENOUS

## 2012-08-17 MED ORDER — INSULIN GLARGINE 100 UNIT/ML ~~LOC~~ SOLN: 20.0000 [IU] | Freq: Every day | SUBCUTANEOUS | Status: DC

## 2012-08-17 MED ORDER — ZOLPIDEM TARTRATE 5 MG PO TABS: 5.0000 mg | ORAL_TABLET | Freq: Every evening | ORAL | Status: DC | PRN

## 2012-08-17 MED ORDER — LORAZEPAM 1 MG PO TABS
2.0000 mg | ORAL_TABLET | Freq: Once | ORAL | Status: AC
  Administered 2012-08-17: 1 mg via ORAL
  Filled 2012-08-17: qty 2

## 2012-08-17 MED ORDER — AMITRIPTYLINE HCL 75 MG PO TABS
150.0000 mg | ORAL_TABLET | Freq: Every day | ORAL | Status: DC
  Administered 2012-08-17: 150 mg via ORAL
  Filled 2012-08-17 (×3): qty 2

## 2012-08-17 MED ORDER — ENOXAPARIN SODIUM 30 MG/0.3ML ~~LOC~~ SOLN
30.0000 mg | SUBCUTANEOUS | Status: DC
  Administered 2012-08-18: 30 mg via SUBCUTANEOUS
  Filled 2012-08-17 (×2): qty 0.3

## 2012-08-17 MED ORDER — ASPIRIN 325 MG PO TABS
325.0000 mg | ORAL_TABLET | Freq: Once | ORAL | Status: DC
  Filled 2012-08-17: qty 1

## 2012-08-17 MED ORDER — INSULIN ASPART 100 UNIT/ML ~~LOC~~ SOLN
0.0000 [IU] | Freq: Three times a day (TID) | SUBCUTANEOUS | Status: DC
  Administered 2012-08-18 (×2): 3 [IU] via SUBCUTANEOUS

## 2012-08-17 MED ORDER — SODIUM CHLORIDE 0.9 % IJ SOLN: 3.0000 mL | INTRAMUSCULAR | Status: DC | PRN

## 2012-08-17 MED ORDER — FLUMAZENIL 0.5 MG/5ML IV SOLN
0.2000 mg | INTRAVENOUS | Status: AC
  Administered 2012-08-18: 0.2 mg via INTRAVENOUS
  Filled 2012-08-17 (×2): qty 5

## 2012-08-17 MED ORDER — SODIUM CHLORIDE 0.9 % IV SOLN: 250.0000 mL | INTRAVENOUS | Status: DC | PRN

## 2012-08-17 MED ORDER — LEVOTHYROXINE SODIUM 100 MCG IV SOLR
37.5000 ug | Freq: Every day | INTRAVENOUS | Status: DC
  Filled 2012-08-17: qty 1.9

## 2012-08-17 MED ORDER — LEVOTHYROXINE SODIUM 75 MCG PO TABS
75.0000 ug | ORAL_TABLET | Freq: Every day | ORAL | Status: DC
  Filled 2012-08-17: qty 1

## 2012-08-17 NOTE — Assessment & Plan Note (Signed)
On Ambien 10 mg qhs

## 2012-08-17 NOTE — Assessment & Plan Note (Signed)
Noted on exam b/l ear canals

## 2012-08-17 NOTE — Progress Notes (Signed)
Subjective:    Patient ID: Kayla [name], female    DOB: 06-29-1961, 51 y.o.   MRN: [id number]  HPI Comments: 51 y.o significant PMH of not hypothyroidism s/p ablation with thyroid not under control, uncontrolled DM with neuropathy, HLD, obesity, depression, abnormal pap, fibromyalgia syndrome.  She presents for worsening falls within the last 6 months with multiple falls at home and outside of her home.  She fell yesterday prior to visit backwards and hit her mid back and buttock area.  She denies pain in these but the areas are sore.  Falls are increasing in frequency.  She is dizzy, lightheaded, off balance, hearing loss in right ear (noted since 06/2011), pressure and sensation of something in right ear x 1 year, notes white contents in ear not wax.  She has tried ear drops in the past which have not helped.  She has not seen ENT.  She feels like the room is spinning which is worsening and has trouble bending over to pick up objects.  She is off balance and feels lightheaded and dizzy with walking.  Worsening dizziness with turning her head too fast, lying down x 1 month.  She has to wait for this feeling to go away.  She denies loss of consciousness with the episodes.  She thinks the right side of her face gets stuck and twitches and this happed last weekend and lasted 5-10 minutes.  She reports taking Amitryptiline 150 mg qhs but her last dose was last week.  Per [facility] she has been on this medication at least since 12/2010 which has side effects of orthostatic hypotension, syncope, balance issues.  She is also not on benzodiazepenes or antidepressants which can cause falls.    She was initially hypoglycemic 56 in the clinic with repeat value 66 then she was given OJ and crackers.  She is taking Lantus 40 units and Novolog 3-5 units after breakfast and after lunch.  She did not bring her meter today.  She does report skipping meals.  She is not taking Metformin 1000 mg bid.   She has hypothyroidism and is  only taking her Synthroid 75 mcq every other day because it "makes her feel crazy"  She has chronic pain syndrome and is taking Elavil 150 mg intermittently.  She is not taking Neurontin 600 mg tid but she states the Neurontin helped her neuropathy pain but the prescription expired.    She has insomnia and takes Ambien 10 mg qhs for sleep  SH: She denies alcohol and smoking.  She lives alone. Currently unemployed. [employer].  She has a son and daughter who check on her intermittently and her daughter is a Quarry manager.  She lives on the 1st floor of an apartment.    Health maintenance: She declines flu shot.  She needs referrals for colonoscopy, mammogram, eye exam, and Ob/GYN     Review of Systems  Constitutional:       She is cold all the time  Respiratory: Negative for shortness of breath.   Cardiovascular: Negative for chest pain.  Gastrointestinal: Positive for constipation. Negative for nausea, vomiting and abdominal pain.  Genitourinary: Negative for dysuria.  Musculoskeletal: Positive for gait problem.  Neurological: Positive for dizziness, light-headedness and headaches. Negative for syncope.       Mild h/a today  Hematological: Does not bruise/bleed easily.  Psychiatric/Behavioral: Positive for sleep disturbance and decreased concentration.       Objective:   Physical Exam  Nursing note and vitals reviewed. Constitutional:  She is oriented to person, place, and time. She appears well-developed and well-nourished. She is cooperative. No distress.  HENT:  Head: Normocephalic and atraumatic.  Right Ear: Tympanic membrane normal. No drainage. Decreased hearing is noted.  Left Ear: No drainage. No decreased hearing is noted.  Nose: Right sinus exhibits no maxillary sinus tenderness. Left sinus exhibits no maxillary sinus tenderness.  Mouth/Throat: Oropharynx is clear and moist and mucous membranes are normal. Abnormal dentition. No oropharyngeal exudate.       Increased cerumen to  ears b/l  Mild maxillary and ethmoid ttp  Eyes: Conjunctivae normal are normal. Pupils are equal, round, and reactive to light. Right eye exhibits no discharge. Left eye exhibits no discharge. No scleral icterus.  Cardiovascular: Normal rate, regular rhythm, S1 normal and S2 normal.   No murmur heard. Pulmonary/Chest: Effort normal and breath sounds normal. No respiratory distress. She has no wheezes.  Abdominal: Soft. Bowel sounds are normal. She exhibits no distension. There is no tenderness.       Obese ab  Musculoskeletal:       Arms:      Mild right upper back pain with ttp  Mild ttp lumbar region  Neurological: She is alert and oriented to person, place, and time. Coordination abnormal.       Slowed finger to nose right hand Increased sensation V1, V2, V3 of face right>left No ataxia with gait 5/5 upper body, shoulder shrug and hand grip strength Slowed rapid alternating movements right hand>left  No tongue deviation or facial droop   Skin: Skin is warm, dry and intact. No bruising and no rash noted. She is not diaphoretic.  Psychiatric: She exhibits a depressed mood.          Assessment & Plan:  Admit to observation for vertigo an recurrent falls r/o infarction with MRI. Also consider BPPV

## 2012-08-17 NOTE — Assessment & Plan Note (Addendum)
Patient is due and needs referrals for eye exam, mammogram, pap smear (12/2010 negative though patient reports history of abnormal pap smear), and colonoscopy.  She declines flu shot today

## 2012-08-17 NOTE — Assessment & Plan Note (Signed)
Lipid Panel     Component Value Date/Time   CHOL 220* 03/04/2012 1616   TRIG 131 03/04/2012 1616   HDL 67 03/04/2012 1616   CHOLHDL 3.3 03/04/2012 1616   VLDL 26 03/04/2012 1616   LDLCALC 127* 03/04/2012 1616   LDL not at goal M127 Consider diet and exercise modifications in the future

## 2012-08-17 NOTE — Telephone Encounter (Signed)
Pt calls and states she has had multiple falls, sat fell in bathtub, did not go to ED. i ask how long she had been falling and she states"a minute", i ask if this meant a week or more, she stated "i don't know, a while" she is scheduled for 1445 today per chilonb, dr Aundra Dubin

## 2012-08-17 NOTE — Progress Notes (Addendum)
Brief Interval Progress Note  S: We were called to the patient's room around 11 pm for somnolence.  Per nursing report, since returning from MRI (after getting Ativan PO 1 mg at 20:17), the patient has been excessively somnolent, and only responsive to painful stimuli, though the patient is moving all 4 extremities in bed spontaneously, "tossing and turning" in bed and occasionally trying to pull at IV site.  Symptoms have been stable, and are not improving or worsening.  Several members of the care team are concerned that the patient may require stepdown care, so we are asked to evaluate.   O: O2 sats have remained in the mid 90's on O2 by [place] at 2L, but O2 sats drop to 89 on room air. BP has been stable, and HR has been mildly tachcardic (90-110's).  Filed Vitals:   08/17/12 1814 08/17/12 2246  BP: 142/71 119/62  Pulse: 58 101  Temp: 99 F (37.2 C)   TempSrc: Oral   Height: 5\' 2"  (1.575 m)   Weight: 170 lb 14.4 oz (77.52 kg)   SpO2: 99% 100%   PEX General: lying in bed, sedated, unresponsive to voice, responds to pain by localizing movements of bilateral UE and verbal responses HEENT: pupils equal, round, but only minimally reactive to light, unable to assess extraocular movements, oropharynx clear Neck: supple, no lymphadenopathy, no JVD Lungs: significant loud ronchi heard bilaterally with or without stethoscope ("upper airway" sounds).  About 1 out of every 20 breaths, patient has brief apnea episode lasting about 5 seconds, with no compromise of O2 sats (?OSA) Heart: regular rate and rhythm, no murmurs, gallops, or rubs, though exam limited by loud breath sounds Abdomen: soft, non-tender, non-distended, normal bowel sounds Extremities: no cyanosis, clubbing, or edema Neurologic: Moves both arms in purposeful localizing movements in response to painful stimuli, says "oh help me" in response to painful stimuli, spontaneously moves all 4 extremities in bed every few seconds  A/P The  patient is a 51 yo woman, presenting for evaluation of dizziness, now with symptoms of somnolence.  1) Somnolence - likely multifactorial, with components of ativan usage (possibly complicated by mildly abnormal liver function), hypothyroidism, and possibly OSA (given upper airway breath sounds).  Less likely etiologies include tylenol overdose (given new elevation in LFT's, though elevation is mild).  MRI brain shows no obvious acute abnormality (called radiology for prelim read).  No evidence of hypoglyemia (CBG = 171) or acidosis (ABG unremarkable). -gave flumazenil 0.2 mg, with mild improvement in mental status, with more spontaneous movements and some spontaneous speech -checking stat CBC, bmet, LFT's, UDS, acetaminophen level -per staff concerns, will transfer to stepdown to more closely monitor patient's respiratory status.  Signed, Kayla [name], PGY2 Pgr [phone] 08/18/2012, 12:04 AM  Addendum: CBC, bmet, acetaminophen level unremarkable. LFT's mildly downtrending.  Per patient's nurse in stepdown, patient is stable, still with upper airway breath sounds, but with O2 sats of 100% on supplemental O2.  Will continue to monitor

## 2012-08-17 NOTE — Patient Instructions (Signed)
Fall Prevention and Home Safety  Falls cause injuries and can affect all age groups. It is possible to prevent falls.    HOW TO PREVENT FALLS   Wear shoes with rubber soles that do not have an opening for your toes.   Keep the inside and outside of your house well lit.   Use night lights throughout your home.   Remove clutter from floors.   Clean up floor spills.   Remove throw rugs or fasten them to the floor with carpet tape.   Do not place electrical cords across pathways.   Put grab bars by your tub, shower, and toilet. Do not use towel bars as grab bars.   Put handrails on both sides of the stairway. Fix loose handrails.   Do not climb on stools or stepladders, if possible.   Do not wax your floors.   Repair uneven or unsafe sidewalks, walkways, or stairs.   Keep items you use a lot within reach.   Be aware of pets.   Keep emergency numbers next to the telephone.   Put smoke detectors in your home and near bedrooms.  Ask your doctor what other things you can do to prevent falls.  Document Released: 07/13/2009 Document Revised: 03/17/2012 Document Reviewed: 12/17/2011  [employer] Patient Information 2013 [employer].

## 2012-08-17 NOTE — Assessment & Plan Note (Signed)
Unemployed is major stressor previously worked in Counsellor, radiology, Personnel officer to the clinic.

## 2012-08-17 NOTE — Assessment & Plan Note (Signed)
This is not an acute issue but needs to be addressed referred for repeat pap after acute issues resolved

## 2012-08-17 NOTE — Assessment & Plan Note (Addendum)
Patient is taking Levothyroxine every other day 75 mcg because she states "it makes her feel crazy" last tsh 107.325 06/2012 and last free t4 0.28 03/04/2012 Will repeat studies today. Advised compliance Uncontrolled hypothyroidism may be contributing to muscular dysfunction and cognitive impairment (i.e. Decreased concentration per patient)

## 2012-08-17 NOTE — H&P (Signed)
Hospital Admission Note Date: 08/17/2012  Patient name: Kayla [name] Medical record number: [id number] Date of birth: October 20, 1960 Age: 51 y.o. Gender: female PCP: Dorian Heckle, MD  Medical Service: [place]  Attending physician:  Dr. Ellwood Dense    1st Contact: Dr. Sandy Salaam  Pager: [phone] 2nd Contact: Dr. Michail Sermon  Pager: [phone] After 5 pm or weekends: 1st Contact:  Intern on call  Pager: [phone] 2nd Contact:  Resident on call Pager: [phone]  Chief Complaint: Dizziness  History of Present Illness: Kayla [name] is a 51 y.o w PMH of poorly controlled hypothyroidism s/p ablation, uncontrolled DM with neuropathy (last A1c 11.8 on 07/21/12), HLD, obesity, depression, and fibromyalgia presenting with several days of acute worsening of chronic dizziness. Over the past 6 months, she has presented to the outpatient clinic with complaints with multiple falls, now increasing in frequency. Her most recent fall was yesterday, as she was trying to get out of the shower and hit her mid back and buttock area. She reports that immediately prior to falls, she feels dizzy, and has a sensation that the room is spinning. This often happens when she gets out of the shower, but also with quick standing or sitting up from lying down. She denies any visual changes or LOC with episodes. She denies any palpitations.   She says that dizziness now occur daily, and that several days last week, she had to remain lying in bed due to her symptoms.  In addition to her dizziness, she also reports hearing loss, a sensation of fullness and tinnitus in R ear. She reports that she occasionally feels numbness of R side of mouth, but this sensations lasts just a few seconds. 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  She reports chronic aching joints from her fibromyalgia.  She denies CP, cough, SOB, acute visual changes, visual field  deficits, fever, chills, weight loss, abdominal pain, nausea, vomiting, diarrhea, constipation, melena, hematochezia, dysuria, hematuria.    Meds: Prescriptions prior to admission  Medication Sig Dispense Refill  . ACCU-CHEK FASTCLIX LANCETS MISC 1 each by Does not apply route 4 (four) times daily -  before meals and at bedtime.  100 each  11  . amitriptyline (ELAVIL) 150 MG tablet Take 1 tablet (150 mg total) by mouth at bedtime.  30 tablet  11  . Blood Glucose Monitoring Suppl (ACCU-CHEK AVIVA PLUS) W/DEVICE KIT 1 kit by Does not apply route as directed.  1 kit  0  . Blood Glucose Monitoring Suppl (ACCU-CHEK NANO SMARTVIEW) W/DEVICE KIT 1 each by Does not apply route 4 (four) times daily -  before meals and at bedtime.  1 kit  0  . insulin aspart (NOVOLOG FLEXPEN) 100 UNIT/ML injection Inject 5-10 units into the skin 15 minutes before each meal.  3 mL  10  . insulin glargine (LANTUS SOLOSTAR) 100 UNIT/ML injection Inject 40 units at bedtime  15 mL  10  . levothyroxine (SYNTHROID) 75 MCG tablet Take 1 tablet (75 mcg total) by mouth daily.  30 tablet  11  . zolpidem (AMBIEN) 10 MG tablet Take 1 tablet (10 mg total) by mouth at bedtime as needed.  30 tablet  3                         Allergies: Allergies as of 08/17/2012 - Review Complete 08/17/2012  Allergen Reaction Noted  . Ace inhibitors  07/31/2006  .  Sulfites Rash 03/04/2012   Past Medical History  Diagnosis Date  . ANA POSITIVE 02/09/2010  . Type 2 diabetes mellitus   . Hyperlipidemia   . Dizziness     Multiple falls at home  . Diabetic neuropathy     Of hands and feet  . Sickle cell trait   . Fibromyalgia   . Hypothyroidism    Past Surgical History  Procedure Date  . Appendectomy    Family History  Problem Relation Age of Onset  . Emphysema Mother   . Cancer Mother     unknown  . Cancer Father     pancreatic   . Down syndrome Brother   . Cancer Maternal Aunt     cancer  . Cancer Other     breast cancer  aunt   History   Social History  . Marital Status: Single    Spouse Name: N/A    Number of Children: N/A  . Years of Education: N/A   Occupational History  . Not on file.   Social History Main Topics  . Smoking status: Former Smoker -- 5 years    Types: Cigarettes    Quit date: 09/30/1990  . Smokeless tobacco: Not on file  . Alcohol Use: No     Comment: Former use, denies heavy use/dependence/abuse  . Drug Use: No  . Sexually Active: Not Currently   Other Topics Concern  . Not on file   Social History Narrative   Lives in [place], alone. Looking for a job. Has a son in town who checks in on her.     Review of Systems: 10 pt ROS performed, pertinent positives and negatives noted in HPI  Physical Exam: Blood pressure 142/71, pulse 58, temperature 99 F (37.2 C), temperature source Oral, height 5\' 2"  (1.575 m), weight 170 lb 14.4 oz (77.52 kg), SpO2 99.00%. Vitals reviewed. General: resting in bed, NAD HEENT: PERRL, EOMI, no scleral icterus Cardiac: RRR, no rubs, murmurs or gallops Pulm: clear to auscultation bilaterally, no wheezes, rales, or rhonchi Abd: soft, nontender, nondistended, BS present Ext: warm and well perfused, no pedal edema Neuro:  Mental status: A+Ox4, mood/affect appropriate CN: CN 2-12 intact in detail. Visual fields full to confrontation, no nystagmus Motor: 5/5 strength bilateral upper and lower extremities Sensory: Decreased sensation and hyperesthesia of bilateral feet, decreased sensation of bilateral hands.  Reflexes: 2+ patellar and brachioradialis Cerebellar: Romberg +. No dysmetria or dysdiadochokinesia  Patient unable to complete Dix-Hallpike maneuver   Lab results: Basic Metabolic Panel:  [facility] 08/17/12 1616  NA 136  K 4.0  CL 98  CO2 29  GLUCOSE 85  BUN 14  CREATININE 1.02  CALCIUM 10.5  MG --  PHOS --   Liver Function Tests:  [facility] 08/17/12 1616  AST 191*  ALT 173*  ALKPHOS 227*  BILITOT 2.0*  PROT 7.8    ALBUMIN 3.6   CBG:  [facility] 08/17/12 1726 08/17/12 1629 08/17/12 1447 08/17/12 1445  GLUCAP 128* 75 66* 56*   Other results: EKG: pending  Assessment & Plan by Problem: Kayla [name] is a 51 y.o w PMH of poorly controlled hypothyroidism s/p ablation, uncontrolled DM with neuropathy, HLD, obesity, depression, and fibromyalgia presenting with several days of acute worsening of chronic dizziness concerning for posterior circulation stroke vs Meniere's disease vs BPPV.  1) Dizziness Ms. Bodley presents with acute worsening of chronic dizziness. Her description of the room spinning and precipitation of symptoms with positional change is suggestive of benign paroxysmal positional  vertigo. However, she also describes right-sided ear fullness tinnitus and decreased hearing acuity, which be consistent with Mnire's disease. In addition, the subacute worsening of her symptoms is concerning for posterior circulation disease, and she certainly at increased risk for ischemic disease given her poorly controlled diabetes. Fortunately she has no focal neurologic deficits on exam. Diabetic autonomic neuropathy leading to orthostasis may also be a factor. -Will order MRI/MRA of head to evaluate posterior circulation - Orthostatic blood pressures - PT/OT vestibular to evaluate and treat - May benefit from aspirin therapy as an outpatient even if MRI negative given multiple risk factors for ischemic brain disease.  2) Elevated liver function tests Patient presents with AST of 191, ALT of 173, total bilirubin of 2, and alkaline phosphatase of 227. These were not present on prior laboratory testing. Etiology is not clear at this point. Pattern would be consistent with cholestasis, however there are no clinical signs or symptoms to support this diagnosis. She denies any abdominal pain and she has no tenderness on exam, in particular Murphy sign was negative. She also has no nausea or vomiting. She does have a history  of autoimmune disease, including Graves disease. She had a positive ANA in 2011 with a titer of 1:1280. She had negative double stranded DNA anti-ENA at the time. Given this history, she could be at risk for autoimmune hepatitis, but LFT elevation is fairly modest. She denies any ingestions. She denies any alcohol use. -Will check INR to better assess synthetic function of liver -Will check GGT in setting of elevated alkaline phosphatase -Will check acute hepatitis serologies as well as HIV antibody  3) Hypothyroidism Patient with history of Graves' disease status post ablation x2 now with very poorly controlled hypothyroidism. Last TSH in October of this year was 107 and free T4 was 0.28. Patient reports intermittent compliance with Synthroid, she says it makes her feel "yucky." Supposed to be taking 31mcg every other day. -TSH and free T4 pending -Will restart Synthroid, will reinforce necessity of compliance with medications the patient prior to discharge  4) Type 2 diabetes mellitus, uncontrolled Patient's last hemoglobin A1c was 11.8 in October of this year, up from 6.3 in June of this year. She reports taking Lantus 40 units at night and playful NovoLog with meals. I'm concerned that patient may not be taking his medications correctly, as at first she reported to me that she was taking 40 units of NovoLog at night and Lantus with meals. She was hypoglycemic with blood sugar 56 in the clinic. - Will provide sliding-scale insulin as patient with labile blood sugars and recent hypoglycemia in the clinic, hold basal insulin for now - Patient will need further education on diabetes medications per discharge  5) Fibromyalgia Patient reports a history of fibromyalgia for which she takes amitriptyline daily. She  does not have documented rheumatologic disease. She has no joint effusions examination today. -Continue amitriptyline while patient -Patient may need further rheumatologic evaluation if  indicated after discharge  6) Chronic normocytic anemia On patient's prior clinic visit in October she was noted to have a hemoglobin of 11.9 with normal MCV of 94. This appears to be above her baseline of around 9 per chart review. May be 2/2 chronic disease in setting of uncontrolled DM w nephropathy. Repeat CBC, iron studies, retic ct,  B12, and folate. It appears that she's had anemia workup in the past, but this issue does not appear to have been addressed in clinic visits over the past few  years.  -Followup labs -Will delve deeper into her records to determine full extent of prior work-up   Dispo: Disposition is deferred at this time, awaiting improvement of current medical problems. Anticipated discharge in approximately day(s).   The patient does have a current PCP (SCHOOLER, KAREN, MD), therefore will be requiring OPC follow-up after discharge.   The patient does not have transportation limitations that hinder transportation to clinic appointments.  Signed: Tonia Brooms 08/17/2012, 7:05 PM

## 2012-08-17 NOTE — Assessment & Plan Note (Addendum)
Orthostatic vitals negative: lying 154/85 (68), sitting 143/88 (70), standing 139/85 (77) Multiple falls over the last 6 months getting more frequent.  Normal pattern is to fall forward but Sunday prior to visit fell backwards Denies LOC with falls, Reports dizziness, lightheadedness, orthostatic changes, (right ear pressure, sensation of something in her right ear-x 1 year), decreased hearing right ear, mild headache today, right sided weakness intermittent x 1 month, +room spinning/vertigo symptoms, dizziness with positional changes.    Plan CMET, anemia panel, TSH, free T4 Admit for observation for vertigo and recurrent falls to get MRI w/o contrast to r/o infarction Ddx includes BPPV-may need to see ENT in the future, vs infarction vs thyroid dysfunction causing Neuro symptoms i.e muscle weakness vs Neurological vs other.  Have her inpatient evaluated by PT/OT for safety, balance issues with recommendations If this is related to ear problem BPPV or inner ear dysfunction she may need referral to ENT.

## 2012-08-17 NOTE — Assessment & Plan Note (Addendum)
11.8 HA1C 06/2012 Hypoglycemic on todays visit 56 then 66 then 70s then 100s after giving OJ and crackers Patient stated she had nothing to eat today Monitor cbg  She is taking Lantus 40 units qhs and Novolog 3-5 units after breakfast and after lunch.  She is not taking Metformin 1000 mg bid  She did not bring in meter which was encouraged for future visits to get DM under control Patient encouraged not to skip meals

## 2012-08-17 NOTE — Assessment & Plan Note (Signed)
Noted 06/2011 hearing test with mild decreased hearing loss right ear Patient has not yet followed with ENT-consider referral for balance issues and right ear pressure and recurrent falls for Ddx BPPV

## 2012-08-18 ENCOUNTER — Encounter: Payer: Self-pay | Admitting: Obstetrics & Gynecology

## 2012-08-18 ENCOUNTER — Inpatient Hospital Stay (HOSPITAL_COMMUNITY): Payer: Self-pay

## 2012-08-18 ENCOUNTER — Encounter (HOSPITAL_COMMUNITY): Payer: Self-pay | Admitting: *Deleted

## 2012-08-18 ENCOUNTER — Encounter: Payer: Self-pay | Admitting: Internal Medicine

## 2012-08-18 DIAGNOSIS — R42 Dizziness and giddiness: Principal | ICD-10-CM

## 2012-08-18 DIAGNOSIS — Z9181 History of falling: Secondary | ICD-10-CM

## 2012-08-18 DIAGNOSIS — E1149 Type 2 diabetes mellitus with other diabetic neurological complication: Secondary | ICD-10-CM

## 2012-08-18 DIAGNOSIS — E039 Hypothyroidism, unspecified: Secondary | ICD-10-CM

## 2012-08-18 LAB — URINALYSIS, ROUTINE W REFLEX MICROSCOPIC
Bilirubin Urine: NEGATIVE
Glucose, UA: NEGATIVE mg/dL
Hgb urine dipstick: NEGATIVE
Ketones, ur: NEGATIVE mg/dL
Nitrite: NEGATIVE
Protein, ur: 30 mg/dL — AB
Specific Gravity, Urine: 1.008 (ref 1.005–1.030)
Urobilinogen, UA: 1 mg/dL (ref 0.0–1.0)
pH: 7 (ref 5.0–8.0)

## 2012-08-18 LAB — HEPATIC FUNCTION PANEL
ALT: 151 U/L — ABNORMAL HIGH (ref 0–35)
AST: 163 U/L — ABNORMAL HIGH (ref 0–37)
Albumin: 3.3 g/dL — ABNORMAL LOW (ref 3.5–5.2)
Alkaline Phosphatase: 208 U/L — ABNORMAL HIGH (ref 39–117)
Bilirubin, Direct: 0.4 mg/dL — ABNORMAL HIGH (ref 0.0–0.3)
Indirect Bilirubin: 1.4 mg/dL — ABNORMAL HIGH (ref 0.3–0.9)
Total Bilirubin: 1.8 mg/dL — ABNORMAL HIGH (ref 0.3–1.2)
Total Protein: 7.4 g/dL (ref 6.0–8.3)

## 2012-08-18 LAB — CBC
HCT: 29 % — ABNORMAL LOW (ref 36.0–46.0)
HCT: 31 % — ABNORMAL LOW (ref 36.0–46.0)
Hemoglobin: 10.3 g/dL — ABNORMAL LOW (ref 12.0–15.0)
Hemoglobin: 10.9 g/dL — ABNORMAL LOW (ref 12.0–15.0)
MCH: 33.9 pg (ref 26.0–34.0)
MCH: 33.9 pg (ref 26.0–34.0)
MCHC: 35.2 g/dL (ref 30.0–36.0)
MCHC: 35.5 g/dL (ref 30.0–36.0)
MCV: 95.4 fL (ref 78.0–100.0)
MCV: 96.3 fL (ref 78.0–100.0)
Platelets: 134 10*3/uL — ABNORMAL LOW (ref 150–400)
Platelets: 136 10*3/uL — ABNORMAL LOW (ref 150–400)
RBC: 3.04 MIL/uL — ABNORMAL LOW (ref 3.87–5.11)
RBC: 3.22 MIL/uL — ABNORMAL LOW (ref 3.87–5.11)
RDW: 13 % (ref 11.5–15.5)
RDW: 13.1 % (ref 11.5–15.5)
WBC: 7.9 10*3/uL (ref 4.0–10.5)
WBC: 8.5 10*3/uL (ref 4.0–10.5)

## 2012-08-18 LAB — BASIC METABOLIC PANEL
BUN: 13 mg/dL (ref 6–23)
CO2: 28 mEq/L (ref 19–32)
Calcium: 10.2 mg/dL (ref 8.4–10.5)
Chloride: 100 mEq/L (ref 96–112)
Creatinine, Ser: 0.94 mg/dL (ref 0.50–1.10)
GFR calc Af Amer: 81 mL/min — ABNORMAL LOW (ref >90)
GFR calc non Af Amer: 70 mL/min — ABNORMAL LOW (ref >90)
Glucose, Bld: 199 mg/dL — ABNORMAL HIGH (ref 70–99)
Potassium: 3.5 mEq/L (ref 3.5–5.1)
Sodium: 136 mEq/L (ref 135–145)

## 2012-08-18 LAB — GLUCOSE, CAPILLARY
Glucose-Capillary: 169 mg/dL — ABNORMAL HIGH (ref 70–99)
Glucose-Capillary: 153 mg/dL — ABNORMAL HIGH (ref 70–99)

## 2012-08-18 LAB — URINE MICROSCOPIC-ADD ON

## 2012-08-18 LAB — COMPREHENSIVE METABOLIC PANEL
ALT: 151 U/L — ABNORMAL HIGH (ref 0–35)
AST: 157 U/L — ABNORMAL HIGH (ref 0–37)
Albumin: 3.4 g/dL — ABNORMAL LOW (ref 3.5–5.2)
Alkaline Phosphatase: 215 U/L — ABNORMAL HIGH (ref 39–117)
BUN: 15 mg/dL (ref 6–23)
CO2: 26 mEq/L (ref 19–32)
Calcium: 10.3 mg/dL (ref 8.4–10.5)
Chloride: 99 mEq/L (ref 96–112)
Creatinine, Ser: 0.91 mg/dL (ref 0.50–1.10)
GFR calc Af Amer: 84 mL/min — ABNORMAL LOW (ref >90)
GFR calc non Af Amer: 72 mL/min — ABNORMAL LOW (ref >90)
Glucose, Bld: 222 mg/dL — ABNORMAL HIGH (ref 70–99)
Potassium: 4 mEq/L (ref 3.5–5.1)
Sodium: 133 mEq/L — ABNORMAL LOW (ref 135–145)
Total Bilirubin: 1.8 mg/dL — ABNORMAL HIGH (ref 0.3–1.2)
Total Protein: 7.6 g/dL (ref 6.0–8.3)

## 2012-08-18 LAB — RAPID URINE DRUG SCREEN, HOSP PERFORMED
Amphetamines: NOT DETECTED
Barbiturates: NOT DETECTED
Benzodiazepines: NOT DETECTED
Cocaine: NOT DETECTED
Opiates: NOT DETECTED
Tetrahydrocannabinol: NOT DETECTED

## 2012-08-18 LAB — CORTISOL: Cortisol, Plasma: 8 ug/dL

## 2012-08-18 LAB — MRSA PCR SCREENING: MRSA by PCR: NEGATIVE

## 2012-08-18 LAB — HEPATITIS PANEL, ACUTE
HCV Ab: NEGATIVE
Hep A IgM: NEGATIVE
Hep B C IgM: NEGATIVE
Hepatitis B Surface Ag: NEGATIVE

## 2012-08-18 LAB — HIV ANTIBODY (ROUTINE TESTING W REFLEX): HIV: NONREACTIVE

## 2012-08-18 LAB — ACETAMINOPHEN LEVEL: Acetaminophen (Tylenol), Serum: <15 ug/mL (ref 10–30)

## 2012-08-18 MED ORDER — MECLIZINE HCL 25 MG PO TABS: 25.0000 mg | ORAL_TABLET | Freq: Four times a day (QID) | ORAL | Status: DC | PRN

## 2012-08-18 MED ORDER — LEVOTHYROXINE SODIUM 100 MCG IV SOLR
50.0000 ug | Freq: Every day | INTRAVENOUS | Status: DC
  Administered 2012-08-18: 50 ug via INTRAVENOUS
  Filled 2012-08-18 (×2): qty 2.5

## 2012-08-18 MED ORDER — ENOXAPARIN SODIUM 40 MG/0.4ML ~~LOC~~ SOLN
40.0000 mg | SUBCUTANEOUS | Status: DC
  Filled 2012-08-18: qty 0.4

## 2012-08-18 MED ORDER — LEVOTHYROXINE SODIUM 100 MCG IV SOLR: 50.0000 ug | Freq: Every day | INTRAVENOUS | Status: DC

## 2012-08-18 NOTE — Progress Notes (Signed)
In and out cath done to obtain urine specimen for drug screen and ua,obtained 700cc of yellow urine.

## 2012-08-18 NOTE — Evaluation (Signed)
Physical Therapy Evaluation Patient Details Name: Kayla [name] MRN: [id number] DOB: 1961-07-29 Today's Date: 08/18/2012 Time: [id number] PT Time Calculation (min): 40 min  PT Assessment / Plan / Recommendation Clinical Impression  Pt. admitted with dizziness and h/o multiple falls; Pt. would benefit from acute PT to address safe mobility so that pt. may return home and to work and further address issues with dizziness. Pt reports falling at least 3x/week with transitions and when looking down at floor. [email or web address] of dizziness with vertical head movements > horizontal head movements. No nystagmus was noted during eval. Pt. given x1 exercises to perform throughout the day in sitting for horizontal and vertical. These exercises were performed during eval; with  horizontal head turns dizziness lasted 18 sec, 6 sec, 70min 13 sec and with vertical head turns dizziness lasted 43 sec, 30 sec. Difficult to judge when pts. symptoms completely subsided secondary to pt. having difficulty following directions and not communicating fully with PT about dizziness. Pt able to visually track in all directions without nystagmus or dizziness. Pt does report episodes at home occur mostly with quick transitions to standing or with bending over and then righting herself.    PT Assessment  Patient needs continued PT services    Follow Up Recommendations  Home health PT, initial supervision with mobility unless all goals met       Barriers to Discharge Decreased caregiver support Daughter works; does not have set schedule    Equipment Recommendations  None recommended by PT - To be determined based on progression      Frequency Min 3X/week    Precautions / Restrictions Precautions Precautions: Fall Restrictions Weight Bearing Restrictions: No   Pertinent Vitals/Pain BP 11/96 lying in bed at start of eval; 129/74 sitting EOB; 116/70 standing; 112/78 after ambulation O2 at 94% room air HR 89 Dizziness 3/10  ambulating in hallway; 5/10 looking up with amb; 4/10 horizontal head turns with amb; 5/10 with x1 exercises horizontal head turns and up to 8/10 with vertical head turns      Mobility  Bed Mobility Bed Mobility: Rolling Right;Rolling Left;Supine to Sit;Sitting - Scoot to [employer] of Bed Rolling Right: 6: Modified independent (Device/Increase time) Rolling Left: 6: Modified independent (Device/Increase time) Supine to Sit: 6: Modified independent (Device/Increase time) Sitting - Scoot to Edge of Bed: 6: Modified independent (Device/Increase time) Details for Bed Mobility Assistance: No dizziness with rolling. Pt. did complain of dizziness when moving from sidelying -> sit; pt. tried to lay back down put PT encouraged her to sit up and communicate when dizziness went away. Stating dizziness only a few seconds but did not rate Transfers Transfers: Sit to Stand;Stand to Sit Sit to Stand: 4: Min guard;From bed Stand to Sit: 4: Min assist;With armrests;To chair/3-in-1 Details for Transfer Assistance: Pt. required min guard for safety due to dizziness with sit -> stand. MinA with stand ->sit to control descent. Ambulation/Gait min assist Ambulation Distance (Feet): 200 Feet Assistive device: None Ambulation/Gait Assistance Details: Pt. instructed on gaze stabilization to decrease dizziness; pt. reported that this did not decrease her symptoms. Pt. required increased time with turns; would not state she was feeling dizzy unless asked. Unsteady gait with periods of dizziness and assist to maintain balance or use of handrail Gait Pattern: unsteady gait with dizziness and decreased stride length Stairs: No           PT Diagnosis: Difficulty walking  PT Problem List: Decreased balance;Decreased mobility PT Treatment Interventions: Gait training;Functional mobility training;Therapeutic activities;Therapeutic exercise;Balance  training;Patient/family education   PT Goals Acute Rehab PT Goals PT Goal  Formulation: With patient Time For Goal Achievement: 08/25/12 Potential to Achieve Goals: Good Pt will Ambulate: >150 feet;with modified independence PT Goal: Ambulate - Progress: Goal set today Pt will Go Up / Down Stairs: 3-5 stairs;with rail(s) PT Goal: Up/Down Stairs - Progress: Goal set today Pt will Perform Home Exercise Program: Independently (Perform x1 exercises) PT Goal: Perform Home Exercise Program - Progress: Goal set today Additional Goals Additional Goal #1: Pt. will be able to complete full vestibular eval in order to to be safe with ADLs. PT Goal: Additional Goal #1 - Progress: Goal set today  Visit Information  Last PT Received On: 08/18/12 Assistance Needed: +1    Subjective Data  Subjective: "What time is it?" Patient Stated Goal: Return to work and return home   Prior Robinson Lives With: Alone Available Help at Discharge:  (Daughter but works varying schedule) Type of Home: Apartment Home Access: Stairs to enter Technical brewer of Steps: 3 Entrance Stairs-Rails: Right Home Layout: One level Bathroom Shower/Tub: Chiropodist: [place]: Hand-held shower hose Prior Function Level of Independence: Independent Able to Take Stairs?: Yes Vocation: Full time employment 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 would lose balance when  asked to look up/down; stated she felt dizzy when looking left/right.  End of Session PT - End of Session Equipment Utilized During Treatment: Gait belt Activity Tolerance: Patient tolerated treatment well Patient left: in chair;with call bell/phone within reach Nurse Communication: Mobility status    Earma Reading SPT 08/18/2012, 8:58 AM  Elwyn Reach, [place]

## 2012-08-18 NOTE — Progress Notes (Signed)
Order given to transfer pt to [place] given to receiving RN.

## 2012-08-18 NOTE — Progress Notes (Signed)
Pt transfered  to 2616.

## 2012-08-18 NOTE — Progress Notes (Signed)
Rapid RN paged to room to evaluate pt.

## 2012-08-18 NOTE — Progress Notes (Signed)
Occupational Therapy Note:  Note order written for PT/OT vestibular eval.  Spoke with PT who reports that pt is unable to complete BADLs due to dizziness, and unsure if this will improve with vestibular treatment, or if ADL deficits will  persist.   Please order OT if as indicated.  PT will assess and treat tomorrow which may provide a better indication of need.  Thanks!  Lucille Passy, OTR/L [phone]

## 2012-08-18 NOTE — Progress Notes (Signed)
Md on call paged.

## 2012-08-18 NOTE — Discharge Summary (Signed)
[facility] Discharge Note  Name: Kayla [name] MRN: [id number] DOB: 1961/04/15 51 y.o.  Date of Admission: 08/17/2012  5:47 PM Date of Discharge: 08/18/2012 Attending Physician: Madilyn Fireman, MD  Discharge Diagnosis: Dizziness, likely secondary to BPPV Elevated liver function tests Hypothyroidism Type 2 DM Fibromyalgia Chronic normocytic anemia    Discharge Medications:   Medication List     As of 08/18/2012 11:43 AM    STOP taking these medications         gabapentin 600 MG tablet   Commonly known as: NEURONTIN      TAKE these medications         ACCU-CHEK FASTCLIX LANCETS Misc   1 each by Does not apply route 4 (four) times daily -  before meals and at bedtime.      ACCU-CHEK NANO SMARTVIEW W/DEVICE Kit   1 each by Does not apply route 4 (four) times daily -  before meals and at bedtime.      ACCU-CHEK AVIVA PLUS W/DEVICE Kit   1 kit by Does not apply route as directed.      amitriptyline 150 MG tablet   Commonly known as: ELAVIL   Take 1 tablet (150 mg total) by mouth at bedtime.      insulin glargine 100 UNIT/ML injection   Commonly known as: LANTUS   Inject 40 units at bedtime      levothyroxine 75 MCG tablet   Commonly known as: SYNTHROID, LEVOTHROID   Take 1 tablet (75 mcg total) by mouth daily.      meclizine 25 MG tablet   Commonly known as: ANTIVERT   Take 1 tablet (25 mg total) by mouth every 6 (six) hours as needed for dizziness.      metFORMIN 1000 MG tablet   Commonly known as: GLUCOPHAGE   Take 1 tablet (1,000 mg total) by mouth 2 (two) times daily with a meal.      NOVOLOG FLEXPEN 100 UNIT/ML injection   Generic drug: insulin aspart   Inject 3-5 Units into the skin 3 (three) times daily before meals.      zolpidem 10 MG tablet   Commonly known as: AMBIEN   Take 1 tablet (10 mg total) by mouth at bedtime as needed.        Disposition and follow-up:   [name] was discharged from [facility] in good condition.  At the hospital follow up visit please address 1) Dizziness Likely 2/2 BPPV. Please assess symptom improvement, encourage pt to follow-up w ENT referral. Meclizine added for symptomatic improvement. 2) LFT elevation Cause unknown (read #2 for full workup). Please check CMP at clinic visit and further work up for LFT elevation as needed.  3) Poor medication compliance Concern that pt is using insulin and synthroid sporadically and not as dosed (see course below). Please review appropriate insulin and synthroid dosing with patient and follow-up as needed to monitor response to therapies.   Follow-up Appointments:      Discharge Orders    Future Appointments: Provider: Department: Dept Phone: Center:   08/24/2012 3:00 PM Hadassah Pais, MD [place] [phone] [facility]   09/28/2012 4:30 PM [place] [phone] [facility]   10/12/2012 10:30 AM Gatha Mayer, MD [place] [phone] [facility]     Future Orders Please Complete By Expires   Diet - low sodium heart healthy      Increase activity slowly  Discharge instructions      Comments:   1. Please take meclizine 25mg  every 6 hours as needed for dizziness. 2. Please follow up in our clinic on 08/24/12 (Monday) with Dr. Posey Pronto at 3:00 pm. 3. Please resume your diabetes medications as prescribed, Lantus 40U at night, and novolog with meals. Please start taking your Synthroid daily, 5mcg. 4. Please DO NOT take Tylenol.   Call MD for:  persistant nausea and vomiting      Call MD for:  severe uncontrolled pain      Call MD for:  persistant dizziness or light-headedness         Consultations:    Procedures Performed:  Mr [name] Contrast  08/18/2012  *RADIOLOGY REPORT*  Clinical Data:  51 year old female with dizziness increasing in severity.  Vertigo.  Comparison: None.  MRI HEAD WITHOUT  CONTRAST  Technique: Multiplanar, multiecho pulse sequences of the brain and surrounding structures were obtained according to standard protocol without intravenous contrast.  Findings: No abnormal or restricted diffusion to indicate acute infarct. No midline shift, ventriculomegaly, mass effect, evidence of mass lesion, extra-axial collection or acute intracranial hemorrhage.  Cervicomedullary junction and pituitary are within normal limits.  Major intracranial vascular flow voids are preserved.   Mild for age scattered nonspecific cerebral white matter T2 and FLAIR hyperintensity.  Otherwise gray and white matter signal is within normal limits.  No cortical encephalomalacia.  Negative visualized cervical spine.  Noncontrast IAC images demonstrate no mass associated with the cisternal or intracanilicular seventh or eighth cranial nerves segments.  Signal in the bilateral cochlea and vestibular structures appears preserved.  There is minimal mastoid air cell fluid, greater on the right. There is also right petrous apex fluid suspected.  Negative visualized nasopharynx.  Paranasal sinuses are clear. Visualized orbit soft tissues are within normal limits.  Negative scalp soft tissues. Visualized bone marrow signal is within normal limits.  IMPRESSION: 1.  Mild mastoid and right petrous apex fluid, probably post inflammatory and usually not of clinical significance. 2. No acute intracranial abnormality.  Mild nonspecific cerebral white matter signal changes. 3.  MRA findings are below.  MRA HEAD WITHOUT CONTRAST  Technique: Angiographic images of the Circle of Willis were obtained using MRA technique without  intravenous contrast.  Findings: Antegrade flow in the posterior circulation with codominant distal vertebral arteries.  Both PICA vessels are patent.  Tortuous but otherwise normal vertebrobasilar junction and proximal basilar artery.  No basilar stenosis.  SCA and PCA origins are within normal limits.  Both  posterior communicating arteries are present.  Suggestion of right PCA P2 segment narrowing on the MIP images is not correlated on the source images.  Bilateral PCA branches are within normal limits.  Antegrade flow in both ICA siphons.  There may be a small persistent right trigeminal artery (series 10 image 62) or less likely a prominent venous vessel simulating that appearance.  Mild irregularity of the supraclinoid segments greater on the left compatible with atherosclerosis.  No ICA stenosis.  Ophthalmic and posterior communicating artery origins are within normal limits; right posterior communicating artery infundibulum.  Carotid termini are patent.  MCA and ACA origins are within normal limits.  Diminutive anterior communicating artery.  Visualized ACA branches are within normal limits.  Visualized bilateral MCA branches are within normal limits.  IMPRESSION: 1.  Negative posterior circulation except for possible small persistent right trigeminal artery (normal anatomic variant). 2.  Supraclinoid ICA atherosclerosis without significant ICA stenosis.  Small right posterior  communicating artery infundibulum. Otherwise negative anterior circulation.   Original Report Authenticated By: Roselyn Reef, M.D.    Mr Brain Wo Contrast  08/18/2012  *RADIOLOGY REPORT*  Clinical Data:  51 year old female with dizziness increasing in severity.  Vertigo.  Comparison: None.  MRI HEAD WITHOUT CONTRAST  Technique: Multiplanar, multiecho pulse sequences of the brain and surrounding structures were obtained according to standard protocol without intravenous contrast.  Findings: No abnormal or restricted diffusion to indicate acute infarct. No midline shift, ventriculomegaly, mass effect, evidence of mass lesion, extra-axial collection or acute intracranial hemorrhage.  Cervicomedullary junction and pituitary are within normal limits.  Major intracranial vascular flow voids are preserved.   Mild for age scattered nonspecific  cerebral white matter T2 and FLAIR hyperintensity.  Otherwise gray and white matter signal is within normal limits.  No cortical encephalomalacia.  Negative visualized cervical spine.  Noncontrast IAC images demonstrate no mass associated with the cisternal or intracanilicular seventh or eighth cranial nerves segments.  Signal in the bilateral cochlea and vestibular structures appears preserved.  There is minimal mastoid air cell fluid, greater on the right. There is also right petrous apex fluid suspected.  Negative visualized nasopharynx.  Paranasal sinuses are clear. Visualized orbit soft tissues are within normal limits.  Negative scalp soft tissues. Visualized bone marrow signal is within normal limits.  IMPRESSION: 1.  Mild mastoid and right petrous apex fluid, probably post inflammatory and usually not of clinical significance. 2. No acute intracranial abnormality.  Mild nonspecific cerebral white matter signal changes. 3.  MRA findings are below.  MRA HEAD WITHOUT CONTRAST  Technique: Angiographic images of the Circle of Willis were obtained using MRA technique without  intravenous contrast.  Findings: Antegrade flow in the posterior circulation with codominant distal vertebral arteries.  Both PICA vessels are patent.  Tortuous but otherwise normal vertebrobasilar junction and proximal basilar artery.  No basilar stenosis.  SCA and PCA origins are within normal limits.  Both posterior communicating arteries are present.  Suggestion of right PCA P2 segment narrowing on the MIP images is not correlated on the source images.  Bilateral PCA branches are within normal limits.  Antegrade flow in both ICA siphons.  There may be a small persistent right trigeminal artery (series 10 image 62) or less likely a prominent venous vessel simulating that appearance.  Mild irregularity of the supraclinoid segments greater on the left compatible with atherosclerosis.  No ICA stenosis.  Ophthalmic and posterior communicating  artery origins are within normal limits; right posterior communicating artery infundibulum.  Carotid termini are patent.  MCA and ACA origins are within normal limits.  Diminutive anterior communicating artery.  Visualized ACA branches are within normal limits.  Visualized bilateral MCA branches are within normal limits.  IMPRESSION: 1.  Negative posterior circulation except for possible small persistent right trigeminal artery (normal anatomic variant). 2.  Supraclinoid ICA atherosclerosis without significant ICA stenosis.  Small right posterior communicating artery infundibulum. Otherwise negative anterior circulation.   Original Report Authenticated By: Roselyn Reef, M.D.    US Abdomen Complete  08/18/2012  *RADIOLOGY REPORT*  Clinical Data:  History of elevated liver function tests. History of prior appendectomy.  ABDOMINAL ULTRASOUND COMPLETE  Comparison:  CT 07/27/2008.  Findings:  Gallbladder: No shadowing gallstones or echogenic sludge. No gallbladder wall thickening or pericholecystic fluid. The gallbladder wall thickness measured 2.1 mm. No sonographic Murphy's sign according to the ultrasound technologist.  CBD: Normal in caliber measuring 3.6 mm. No choledocholithiasis is evident.  Liver:  Normal size and echotexture without focal parenchymal abnormality. Portal vein is patent with hepatopetal flow.  IVC:  Patent throughout its visualized course in the abdomen.  Pancreas:  Although the pancreas is difficult to visualize in its entirety, no focal pancreatic abnormality is identified.  Spleen:  Normal size and echotexture without focal abnormality. Length is 6.1  Right kidney:  No hydronephrosis.  Well-preserved cortex.  Normal parenchymal echotexture without focal abnormalities.  Right renal length is 10.9 cm.  Left kidney:  No hydronephrosis.  Well-preserved cortex.  Normal parenchymal echotexture without focal abnormalities.  Left renal length is 10.6 cm.  Aorta:  Maximum diameter is 2.2 cm.  No  aneurysm is evident.  Ascites:  None.  IMPRESSION: No abdominal pathology was demonstrated.   Original Report Authenticated By: Shanon Brow Call     Admission HPI:  Ms. Majewski is a 51 y.o w PMH of poorly controlled hypothyroidism s/p ablation, uncontrolled DM with neuropathy (last A1c 11.8 on 07/21/12), HLD, obesity, depression, and fibromyalgia presenting with several days of acute worsening of chronic dizziness.  Over the past 6 months, she has presented to the outpatient clinic with complaints with multiple falls, now increasing in frequency. Her most recent fall was yesterday, as she was trying to get out of the shower and hit her mid back and buttock area. She reports that immediately prior to falls, she feels dizzy, and has a sensation that the room is spinning. This often happens when she gets out of the shower, but also with quick standing or sitting up from lying down. She denies any visual changes or LOC with episodes. She denies any palpitations. She says that dizziness now occur daily, and that several days last week, she had to remain lying in bed due to her symptoms.  In addition to her dizziness, she also reports hearing loss, a sensation of fullness and tinnitus in R ear. She reports that she occasionally feels numbness of R side of mouth, but this sensations lasts just a few seconds. 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  She reports chronic aching joints from her fibromyalgia.  She denies CP, cough, SOB, acute visual changes, visual field deficits, fever, chills, weight loss, abdominal pain, nausea, vomiting, diarrhea, constipation, melena, hematochezia, dysuria, hematuria.    Hospital Course by problem list:  1) Dizziness, likely secondary to BPPV Ms. Lascola presented with acute worsening of chronic dizziness. She was admitted to the inpatient service due to concern in the outpatient clinic that  acute on chronic dizziness may represent posterior circulation disease. She had no focal neurological deficits and MRI/MRA was negative for any posterior circulation infarct/anomaly. Her description of the room spinning and precipitation of symptoms with positional change was suggestive of benign paroxysmal positional vertigo. Dix-hallpike could not be performed due to patient feeling too dizzy to participate. She also described right-sided ear fullness tinnitus and decreased hearing acuity, which could consistent with Mnire's disease. PT/OT did not find any clear vestibular cause of symptoms. They recommended home PT 3x week to assess falls. She was provided prescription for meclizine for symptomatic relief.     2) Elevated liver function tests  Patient presented with AST of 191, ALT of 173, total bilirubin of 2, and alkaline phosphatase of 227 and GGT 1019. These were not present on prior laboratory testing. She denied any abdominal pain and she had no tenderness on exam, in particular Murphy sign was  negative.Pattern would be consistent with cholestasis, however there were no clinical signs or symptoms to support this diagnosis and abdominal ultrasound did not reveal any biliary disease. Hepatitis serologies negative, HIV nonreactive.  She does have a history of autoimmune disease, including Graves disease. She had a positive ANA in 2011 with a titer of 1:1280. She had negative double stranded DNA anti-ENA at the time. Given this history, she could be at risk for autoimmune hepatitis, but LFT elevation is fairly modest. She denied any ingestions (specifically tylenol) or new medications (such as statin). She denied any alcohol use. Enzymes downtrended. Recommend follow up CMP at outpatient visit.     3) Hypothyroidism  Patient with history of Graves' disease status post ablation x2 now with very poorly controlled hypothyroidism. Last TSH in October of this year was 107 and free T4 was 0.28. Repeat TSH  134 and free T4 0.22. Patient reports intermittent compliance with Synthroid, she says it makes her feel "yucky." Supposed to be taking 2mcg every other day. Synthroid 71mcg daily was started on admission and continued on discharge. She will need follow-up to evaluate compliance and response.   4) Type 2 diabetes mellitus, uncontrolled  Patient's last hemoglobin A1c was 11.8 in October of this year, up from 6.3 in June of this year. She reported taking Lantus 40 units at night and 3-5 units of NovoLog with meals. There was concern that patient may not be taking her medications correctly, as at first she reported to me that she was taking 40 units of NovoLog at night and Lantus with meals. She was hypoglycemic with blood sugar 56 in the clinic. No hypoglycemia during admission. Education was provided about appropriate insulin use and she was discharged on home regimen.   5) Fibromyalgia  Patient reports a history of fibromyalgia for which she takes amitriptyline daily. She does not have documented rheumatologic disease. She has no joint effusions on examination.   6) Chronic normocytic anemia  On patient's prior clinic visit in October she was noted to have a hemoglobin of 11.9 with normal MCV of 94. This appears to be above her baseline of around 9 per chart review. Likely 2/2 chronic disease. Anemia panel here unrevealing.   Discharge Vitals:  BP 112/78  Pulse 81  Temp 98.6 F (37 C) (Oral)  Resp 12  Ht 5\' 2"  (1.575 m)  Wt 169 lb 12.1 oz (77 kg)  BMI 31.05 kg/m2  SpO2 96%  Physical exam General: resting in bed, NAD  HEENT: PERRL, EOMI, no scleral icterus  Cardiac: RRR, no rubs, murmurs or gallops  Pulm: clear to auscultation bilaterally, no wheezes, rales, or rhonchi  Abd: soft, nontender, nondistended, BS present  Ext: warm and well perfused, no pedal edema  Neuro: alert and oriented X3, cranial nerves II-XII grossly intact, strength and sensation to light touch equal in bilateral  upper and lower extremities. Dizzy with sitting up quickly. No nystagmus  Discharge Labs:  Results for orders placed during the hospital encounter of 08/17/12 (from the past 24 hour(s))  PROTIME-INR     Status: Normal   Collection Time   08/17/12  9:08 PM      Component Value Range   Prothrombin Time 13.1  11.6 - 15.2 seconds   INR 1.00  0.00 - 1.49  GAMMA GT     Status: Abnormal   Collection Time   08/17/12  9:08 PM      Component Value Range   GGT 1019 (*) 7 - 51  U/L  HIV ANTIBODY (ROUTINE TESTING)     Status: Normal   Collection Time   08/17/12  9:08 PM      Component Value Range   HIV NON REACTIVE  NON REACTIVE  CBC WITH DIFFERENTIAL     Status: Abnormal   Collection Time   08/17/12  9:08 PM      Component Value Range   WBC 8.9  4.0 - 10.5 K/uL   RBC 3.30 (*) 3.87 - 5.11 MIL/uL   Hemoglobin 11.3 (*) 12.0 - 15.0 g/dL   HCT 31.2 (*) 36.0 - 46.0 %   MCV 94.5  78.0 - 100.0 fL   MCH 34.2 (*) 26.0 - 34.0 pg   MCHC 36.2 (*) 30.0 - 36.0 g/dL   RDW 12.8  11.5 - 15.5 %   Platelets 154  150 - 400 K/uL   Neutrophils Relative 70  43 - 77 %   Neutro Abs 6.2  1.7 - 7.7 K/uL   Lymphocytes Relative 22  12 - 46 %   Lymphs Abs 2.0  0.7 - 4.0 K/uL   Monocytes Relative 5  3 - 12 %   Monocytes Absolute 0.4  0.1 - 1.0 K/uL   Eosinophils Relative 2  0 - 5 %   Eosinophils Absolute 0.2  0.0 - 0.7 K/uL   Basophils Relative 1  0 - 1 %   Basophils Absolute 0.1  0.0 - 0.1 K/uL  LIPASE, BLOOD     Status: Abnormal   Collection Time   08/17/12  9:08 PM      Component Value Range   Lipase 67 (*) 11 - 59 U/L  GLUCOSE, CAPILLARY     Status: Abnormal   Collection Time   08/17/12 10:09 PM      Component Value Range   Glucose-Capillary 171 (*) 70 - 99 mg/dL  GLUCOSE, CAPILLARY     Status: Abnormal   Collection Time   08/17/12 11:04 PM      Component Value Range   Glucose-Capillary 184 (*) 70 - 99 mg/dL  BLOOD GAS, ARTERIAL     Status: Abnormal   Collection Time   08/17/12 11:30 PM       Component Value Range   O2 Content 2.0     Delivery systems NASAL CANNULA     pH, Arterial 7.392  7.350 - 7.450   pCO2 arterial 46.6 (*) 35.0 - 45.0 mmHg   pO2, Arterial 93.8  80.0 - 100.0 mmHg   Bicarbonate 27.7 (*) 20.0 - 24.0 mEq/L   TCO2 29.2  0 - 100 mmol/L   Acid-Base Excess 3.2 (*) 0.0 - 2.0 mmol/L   O2 Saturation 98.7     Patient temperature 98.6     Collection site LEFT RADIAL     Drawn by [id number]     Sample type ARTERIAL     Allens test (pass/fail) PASS  PASS  CBC     Status: Abnormal   Collection Time   08/17/12 11:45 PM      Component Value Range   WBC 8.5  4.0 - 10.5 K/uL   RBC 3.04 (*) 3.87 - 5.11 MIL/uL   Hemoglobin 10.3 (*) 12.0 - 15.0 g/dL   HCT 29.0 (*) 36.0 - 46.0 %   MCV 95.4  78.0 - 100.0 fL   MCH 33.9  26.0 - 34.0 pg   MCHC 35.5  30.0 - 36.0 g/dL   RDW 13.1  11.5 - 15.5 %   Platelets 134 (*) 150 -  400 K/uL  ACETAMINOPHEN LEVEL     Status: Normal   Collection Time   08/17/12 11:46 PM      Component Value Range   Acetaminophen (Tylenol), Serum <15.0  10 - 30 ug/mL  BASIC METABOLIC PANEL     Status: Abnormal   Collection Time   08/17/12 11:46 PM      Component Value Range   Sodium 136  135 - 145 mEq/L   Potassium 3.5  3.5 - 5.1 mEq/L   Chloride 100  96 - 112 mEq/L   CO2 28  19 - 32 mEq/L   Glucose, Bld 199 (*) 70 - 99 mg/dL   BUN 13  6 - 23 mg/dL   Creatinine, Ser 0.94  0.50 - 1.10 mg/dL   Calcium 10.2  8.4 - 10.5 mg/dL   GFR calc non Af Amer 70 (*) >90 mL/min   GFR calc Af Amer 81 (*) >90 mL/min  HEPATIC FUNCTION PANEL     Status: Abnormal   Collection Time   08/17/12 11:46 PM      Component Value Range   Total Protein 7.4  6.0 - 8.3 g/dL   Albumin 3.3 (*) 3.5 - 5.2 g/dL   AST 163 (*) 0 - 37 U/L   ALT 151 (*) 0 - 35 U/L   Alkaline Phosphatase 208 (*) 39 - 117 U/L   Total Bilirubin 1.8 (*) 0.3 - 1.2 mg/dL   Bilirubin, Direct 0.4 (*) 0.0 - 0.3 mg/dL   Indirect Bilirubin 1.4 (*) 0.3 - 0.9 mg/dL  URINALYSIS, ROUTINE W REFLEX MICROSCOPIC      Status: Abnormal   Collection Time   08/18/12 12:26 AM      Component Value Range   Color, Urine YELLOW  YELLOW   APPearance CLOUDY (*) CLEAR   Specific Gravity, Urine 1.008  1.005 - 1.030   pH 7.0  5.0 - 8.0   Glucose, UA NEGATIVE  NEGATIVE mg/dL   Hgb urine dipstick NEGATIVE  NEGATIVE   Bilirubin Urine NEGATIVE  NEGATIVE   Ketones, ur NEGATIVE  NEGATIVE mg/dL   Protein, ur 30 (*) NEGATIVE mg/dL   Urobilinogen, UA 1.0  0.0 - 1.0 mg/dL   Nitrite NEGATIVE  NEGATIVE   Leukocytes, UA SMALL (*) NEGATIVE  URINE RAPID DRUG SCREEN (HOSP PERFORMED)     Status: Normal   Collection Time   08/18/12 12:26 AM      Component Value Range   Opiates NONE DETECTED  NONE DETECTED   Cocaine NONE DETECTED  NONE DETECTED   Benzodiazepines NONE DETECTED  NONE DETECTED   Amphetamines NONE DETECTED  NONE DETECTED   Tetrahydrocannabinol NONE DETECTED  NONE DETECTED   Barbiturates NONE DETECTED  NONE DETECTED  URINE MICROSCOPIC-ADD ON     Status: Abnormal   Collection Time   08/18/12 12:26 AM      Component Value Range   Squamous Epithelial / LPF FEW (*) RARE   WBC, UA 3-6  <3 WBC/hpf   Bacteria, UA MANY (*) RARE  MRSA PCR SCREENING     Status: Normal   Collection Time   08/18/12  1:13 AM      Component Value Range   MRSA by PCR NEGATIVE  NEGATIVE  COMPREHENSIVE METABOLIC PANEL     Status: Abnormal   Collection Time   08/18/12  4:55 AM      Component Value Range   Sodium 133 (*) 135 - 145 mEq/L   Potassium 4.0  3.5 - 5.1 mEq/L  Chloride 99  96 - 112 mEq/L   CO2 26  19 - 32 mEq/L   Glucose, Bld 222 (*) 70 - 99 mg/dL   BUN 15  6 - 23 mg/dL   Creatinine, Ser 0.91  0.50 - 1.10 mg/dL   Calcium 10.3  8.4 - 10.5 mg/dL   Total Protein 7.6  6.0 - 8.3 g/dL   Albumin 3.4 (*) 3.5 - 5.2 g/dL   AST 157 (*) 0 - 37 U/L   ALT 151 (*) 0 - 35 U/L   Alkaline Phosphatase 215 (*) 39 - 117 U/L   Total Bilirubin 1.8 (*) 0.3 - 1.2 mg/dL   GFR calc non Af Amer 72 (*) >90 mL/min   GFR calc Af Amer 84 (*) >90  mL/min  CBC     Status: Abnormal   Collection Time   08/18/12  4:55 AM      Component Value Range   WBC 7.9  4.0 - 10.5 K/uL   RBC 3.22 (*) 3.87 - 5.11 MIL/uL   Hemoglobin 10.9 (*) 12.0 - 15.0 g/dL   HCT 31.0 (*) 36.0 - 46.0 %   MCV 96.3  78.0 - 100.0 fL   MCH 33.9  26.0 - 34.0 pg   MCHC 35.2  30.0 - 36.0 g/dL   RDW 13.0  11.5 - 15.5 %   Platelets 136 (*) 150 - 400 K/uL  GLUCOSE, CAPILLARY     Status: Abnormal   Collection Time   08/18/12  8:41 AM      Component Value Range   Glucose-Capillary 169 (*) 70 - 99 mg/dL   Comment 1 Notify RN      Signed: Tonia Brooms 08/18/2012, 11:43 AM   Time Spent on Discharge: 85min Services Ordered on Discharge: home health PT Equipment Ordered on Discharge: none

## 2012-08-18 NOTE — Care Management Note (Signed)
    Page 1 of 1   08/18/2012     12:58:19 PM   CARE MANAGEMENT NOTE 08/18/2012  Patient:  EMAHNI, MEINHART   Account Number:  [id number]  Date Initiated:  08/18/2012  Documentation initiated by:  Babette Relic  Subjective/Objective Assessment:   51 yr old frmale adm with dx of dizziness; lives alone     DC Planning Services  CM consult      [facility] Choice  HOME HEALTH   Choice offered to / List presented to:  C-1 Patient        [place] arranged  [place] PT      [place].   Status of service:  Completed, signed off  Discharge Disposition:  [place]  Per UR Regulation:  Reviewed for med. necessity/level of care/duration of stay  Comments:  PCP: Dr Dorian Heckle  08/18/12 [place] RN BSN MSN CCM PT recommends home health PT, discussed with pt who agrees. Provided list of [facility] agencies, referral made per choice.

## 2012-08-18 NOTE — H&P (Signed)
Internal Medicine Teaching Service Attending Note Date: 08/18/2012  Patient name: Kayla [name]  Medical record number: [id number]  Date of birth: 1960/10/30   Chief Complaint: Dizziness . History of Present Illness The patient, Kayla [name], is a 51 y.o. year old female who comes in with the chief complaint of dizziness for over one year now. She has a PMH as mentioned below.She says her dizziness is present all the time, but gets worse intermittently. It worsens with movement of her head, especially looking down. She denies nausea, vomiting or headaches. She does complain of neck pain. She came in yesterday because she felt dizzy and had a fall. [name] is an uncontrolled diabetic with significant peripheral neuropathy who is non-compliant with her medications, according to her PCP Dr. Michail Sermon. She denies loss of consciousness.   Rest, I agree with Dr. Thora Lance HPI.   Past Medical History   has a past medical history of ANA POSITIVE (02/09/2010); Type 2 diabetes mellitus; Hyperlipidemia; Dizziness; Diabetic neuropathy; Sickle cell trait; Fibromyalgia; and Hypothyroidism.  Medications  Reviewed  Family History family history includes Cancer in her father, maternal aunt, mother, and other; Down syndrome in her brother; and Emphysema in her mother.  Social History  reports that she quit smoking about 21 years ago. Her smoking use included Cigarettes. She quit after 5 years of use. She does not have any smokeless tobacco history on file. She reports that she does not drink alcohol or use illicit drugs.  Review of Systems Positive for - dizziness Negative for - chest pain, jaw pain, headache, cough, SOB, acute visual changes, visual field deficits, fever, chills, weight loss, abdominal pain, nausea, vomiting, diarrhea, constipation, melena, hematochezia, dysuria, hematuria.   Vital Signs: Filed Vitals:   08/18/12 1200  BP: 140/72  Pulse: 80  Temp: 98.4 F (36.9 C)  Resp: 17     Physical Exam:  I met with patient around 9 am today.  Vitals reviewed.  General: Resting in bed. HEENT: PERRL, EOMI, no scleral icterus. Heart: RRR, no rubs, murmurs or gallops. Lungs: Clear to auscultation bilaterally, no wheezes, rales, or rhonchi. Abdomen: Soft, nontender, nondistended, BS present. Extremities: Warm, no pedal edema. Neuro: Alert and oriented X3, cranial nerves II-XII grossly intact,  Strength. Hyperasthesia in lower extremities, from toes to mid calves. Dull sensation on the dorsum of hands bilaterally.    Lab results: CMP     Component Value Date/Time   NA 133* 08/18/2012 0455   K 4.0 08/18/2012 0455   CL 99 08/18/2012 0455   CO2 26 08/18/2012 0455   GLUCOSE 222* 08/18/2012 0455   BUN 15 08/18/2012 0455   CREATININE 0.91 08/18/2012 0455   CREATININE 1.02 08/17/2012 1616   CALCIUM 10.3 08/18/2012 0455   PROT 7.6 08/18/2012 0455   ALBUMIN 3.4* 08/18/2012 0455   AST 157* 08/18/2012 0455   ALT 151* 08/18/2012 0455   ALKPHOS 215* 08/18/2012 0455   BILITOT 1.8* 08/18/2012 0455   GFRNONAA 72* 08/18/2012 0455   GFRAA 84* 08/18/2012 0455   CBC    Component Value Date/Time   WBC 7.9 08/18/2012 0455   RBC 3.22* 08/18/2012 0455   HGB 10.9* 08/18/2012 0455   HCT 31.0* 08/18/2012 0455   PLT 136* 08/18/2012 0455   MCV 96.3 08/18/2012 0455   MCH 33.9 08/18/2012 0455   MCHC 35.2 08/18/2012 0455   RDW 13.0 08/18/2012 0455   LYMPHSABS 2.0 08/17/2012 2108   MONOABS 0.4 08/17/2012 2108   EOSABS 0.2 08/17/2012 2108  BASOSABS 0.1 08/17/2012 2108    Imaging results:  Mr Virgel Paling [id number] Contrast  08/18/2012  *RADIOLOGY REPORT*  Clinical Data:  51 year old female with dizziness increasing in severity.  Vertigo.  Comparison: None.  MRI HEAD WITHOUT CONTRAST  Technique: Multiplanar, multiecho pulse sequences of the brain and surrounding structures were obtained according to standard protocol without intravenous contrast.  Findings: No abnormal or restricted  diffusion to indicate acute infarct. No midline shift, ventriculomegaly, mass effect, evidence of mass lesion, extra-axial collection or acute intracranial hemorrhage.  Cervicomedullary junction and pituitary are within normal limits.  Major intracranial vascular flow voids are preserved.   Mild for age scattered nonspecific cerebral white matter T2 and FLAIR hyperintensity.  Otherwise gray and white matter signal is within normal limits.  No cortical encephalomalacia.  Negative visualized cervical spine.  Noncontrast IAC images demonstrate no mass associated with the cisternal or intracanilicular seventh or eighth cranial nerves segments.  Signal in the bilateral cochlea and vestibular structures appears preserved.  There is minimal mastoid air cell fluid, greater on the right. There is also right petrous apex fluid suspected.  Negative visualized nasopharynx.  Paranasal sinuses are clear. Visualized orbit soft tissues are within normal limits.  Negative scalp soft tissues. Visualized bone marrow signal is within normal limits.  IMPRESSION: 1.  Mild mastoid and right petrous apex fluid, probably post inflammatory and usually not of clinical significance. 2. No acute intracranial abnormality.  Mild nonspecific cerebral white matter signal changes. 3.  MRA findings are below.  MRA HEAD WITHOUT CONTRAST  Technique: Angiographic images of the Circle of Willis were obtained using MRA technique without  intravenous contrast.  Findings: Antegrade flow in the posterior circulation with codominant distal vertebral arteries.  Both PICA vessels are patent.  Tortuous but otherwise normal vertebrobasilar junction and proximal basilar artery.  No basilar stenosis.  SCA and PCA origins are within normal limits.  Both posterior communicating arteries are present.  Suggestion of right PCA P2 segment narrowing on the MIP images is not correlated on the source images.  Bilateral PCA branches are within normal limits.  Antegrade flow  in both ICA siphons.  There may be a small persistent right trigeminal artery (series 10 image 62) or less likely a prominent venous vessel simulating that appearance.  Mild irregularity of the supraclinoid segments greater on the left compatible with atherosclerosis.  No ICA stenosis.  Ophthalmic and posterior communicating artery origins are within normal limits; right posterior communicating artery infundibulum.  Carotid termini are patent.  MCA and ACA origins are within normal limits.  Diminutive anterior communicating artery.  Visualized ACA branches are within normal limits.  Visualized bilateral MCA branches are within normal limits.  IMPRESSION: 1.  Negative posterior circulation except for possible small persistent right trigeminal artery (normal anatomic variant). 2.  Supraclinoid ICA atherosclerosis without significant ICA stenosis.  Small right posterior communicating artery infundibulum. Otherwise negative anterior circulation.   Original Report Authenticated By: Roselyn Reef, M.D.    Mr Brain Wo Contrast  08/18/2012  *RADIOLOGY REPORT*  Clinical Data:  51 year old female with dizziness increasing in severity.  Vertigo.  Comparison: None.  MRI HEAD WITHOUT CONTRAST  Technique: Multiplanar, multiecho pulse sequences of the brain and surrounding structures were obtained according to standard protocol without intravenous contrast.  Findings: No abnormal or restricted diffusion to indicate acute infarct. No midline shift, ventriculomegaly, mass effect, evidence of mass lesion, extra-axial collection or acute intracranial hemorrhage.  Cervicomedullary junction and pituitary are within normal limits.  Major intracranial vascular flow voids are preserved.   Mild for age scattered nonspecific cerebral white matter T2 and FLAIR hyperintensity.  Otherwise gray and white matter signal is within normal limits.  No cortical encephalomalacia.  Negative visualized cervical spine.  Noncontrast IAC images  demonstrate no mass associated with the cisternal or intracanilicular seventh or eighth cranial nerves segments.  Signal in the bilateral cochlea and vestibular structures appears preserved.  There is minimal mastoid air cell fluid, greater on the right. There is also right petrous apex fluid suspected.  Negative visualized nasopharynx.  Paranasal sinuses are clear. Visualized orbit soft tissues are within normal limits.  Negative scalp soft tissues. Visualized bone marrow signal is within normal limits.  IMPRESSION: 1.  Mild mastoid and right petrous apex fluid, probably post inflammatory and usually not of clinical significance. 2. No acute intracranial abnormality.  Mild nonspecific cerebral white matter signal changes. 3.  MRA findings are below.  MRA HEAD WITHOUT CONTRAST  Technique: Angiographic images of the Circle of Willis were obtained using MRA technique without  intravenous contrast.  Findings: Antegrade flow in the posterior circulation with codominant distal vertebral arteries.  Both PICA vessels are patent.  Tortuous but otherwise normal vertebrobasilar junction and proximal basilar artery.  No basilar stenosis.  SCA and PCA origins are within normal limits.  Both posterior communicating arteries are present.  Suggestion of right PCA P2 segment narrowing on the MIP images is not correlated on the source images.  Bilateral PCA branches are within normal limits.  Antegrade flow in both ICA siphons.  There may be a small persistent right trigeminal artery (series 10 image 62) or less likely a prominent venous vessel simulating that appearance.  Mild irregularity of the supraclinoid segments greater on the left compatible with atherosclerosis.  No ICA stenosis.  Ophthalmic and posterior communicating artery origins are within normal limits; right posterior communicating artery infundibulum.  Carotid termini are patent.  MCA and ACA origins are within normal limits.  Diminutive anterior communicating  artery.  Visualized ACA branches are within normal limits.  Visualized bilateral MCA branches are within normal limits.  IMPRESSION: 1.  Negative posterior circulation except for possible small persistent right trigeminal artery (normal anatomic variant). 2.  Supraclinoid ICA atherosclerosis without significant ICA stenosis.  Small right posterior communicating artery infundibulum. Otherwise negative anterior circulation.   Original Report Authenticated By: Roselyn Reef, M.D.    US Abdomen Complete  08/18/2012  *RADIOLOGY REPORT*  Clinical Data:  History of elevated liver function tests. History of prior appendectomy.  ABDOMINAL ULTRASOUND COMPLETE  Comparison:  CT 07/27/2008.  Findings:  Gallbladder: No shadowing gallstones or echogenic sludge. No gallbladder wall thickening or pericholecystic fluid. The gallbladder wall thickness measured 2.1 mm. No sonographic Murphy's sign according to the ultrasound technologist.  CBD: Normal in caliber measuring 3.6 mm. No choledocholithiasis is evident.  Liver:  Normal size and echotexture without focal parenchymal abnormality. Portal vein is patent with hepatopetal flow.  IVC:  Patent throughout its visualized course in the abdomen.  Pancreas:  Although the pancreas is difficult to visualize in its entirety, no focal pancreatic abnormality is identified.  Spleen:  Normal size and echotexture without focal abnormality. Length is 6.1  Right kidney:  No hydronephrosis.  Well-preserved cortex.  Normal parenchymal echotexture without focal abnormalities.  Right renal length is 10.9 cm.  Left kidney:  No hydronephrosis.  Well-preserved cortex.  Normal parenchymal echotexture without focal abnormalities.  Left renal length is 10.6 cm.  Aorta:  Maximum diameter is 2.2 cm.  No aneurysm is evident.  Ascites:  None.  IMPRESSION: No abdominal pathology was demonstrated.   Original Report Authenticated By: Shanon Brow Call     Assessment and Plan:  Dizziness, likely differentials:    1. BPPV or Meniere's disease: She has positional increase in her vertigo, which lasts for a few minutes, which favors BPPV. We could not get Dix Hallpike maneuver since the patient felt very dizzy even to do it. The duration is chronic thus it does not sound like inflammatory conditions like Vestibular neuronitis. In the triad of Meniers's disease (vertigo, hearing loss, tinnitus), she reports tinnitus, but no hearing loss.    2. Cardiac origin: Unlikely, as she has not demonstrated any arrhythmias, she is not orthostatic.   3. Stroke, or posterior circulation abnormality: Ruled out with MRI and MRA.   Given the negative work up since admission up to the time that I saw her, I would think that the patient is ruled out cardiac and acute stroke - wise.    Elevated LFTs: We obtained a GB ultraound which does not show any pathology. She does not give any history of any acute injury of insult that could cause this finding. She has not started any new medication and she is absolutely no abdomen related symptoms. I think at this time we do not know what is causing this rise, but we can trend them. Even when the patient is discharged, it would be advised to follow the LFTs soon.  For her Diabetes and Hypothyroidism: Non-compliance is the issue here. I agree with Dr. Thora Lance plan on this. Her anemia could be a part of her uncontrolled thyroid disease.  Thanks, Madilyn Fireman, MD 11/19/20134:07 PM

## 2012-08-18 NOTE — Progress Notes (Signed)
Pt d/c home (daughter to pick up pt).  Pt IV and tele d/c'd.  D/c instructions given, pt verbalizes understanding.  Pt to be wheeled out via volunteer.  Loleta Dicker, RN

## 2012-08-18 NOTE — Progress Notes (Signed)
Subjective: Interval events: overnight, night team was called for somnolence. After receiving 1mg  po ativan in MRI, was sedated and unresponsive to voice with decreased reactivity of pupils.Status improved significantly with flumazenil, at baseline mental status in the morning. STAT labs demonstrated no new derangements. MRI/MRA negative for stroke/posterior circulation abnormality  This morning, pt says dizziness somewhat improved. Was evaluated by PT who recommended home health PT for fall assessment/prevention. Denies CP, SOB, N/V, dizziness, abdominal pain, diarrhea.  Objective: Vital signs in last 24 hours: Filed Vitals:   08/18/12 0300 08/18/12 0400 08/18/12 0827 08/18/12 0844  BP: 112/61 120/57 111/96 112/78  Pulse: 96 95 89 81  Temp: 98 F (36.7 C) 98.2 F (36.8 C)  98.6 F (37 C)  TempSrc: Oral Oral  Oral  Resp: 17 18  12   Height:      Weight:      SpO2: 100% 99% 94% 96%   Weight change:   Intake/Output Summary (Last 24 hours) at 08/18/12 1143 Last data filed at 08/18/12 1000  Gross per 24 hour  Intake    240 ml  Output      0 ml  Net    240 ml  Vitals reviewed. General: resting in bed, NAD HEENT: PERRL, EOMI, no scleral icterus Cardiac: RRR, no rubs, murmurs or gallops Pulm: clear to auscultation bilaterally, no wheezes, rales, or rhonchi Abd: soft, nontender, nondistended, BS present Ext: warm and well perfused, no pedal edema Neuro: alert and oriented X3, cranial nerves II-XII grossly intact, strength and sensation to light touch equal in bilateral upper and lower extremities. Dizzy with sitting up quickly. No nystagmus  Lab Results: Basic Metabolic Panel:  Lab [id number] 0455 08/17/12 2346  NA 133* 136  K 4.0 3.5  CL 99 100  CO2 26 28  GLUCOSE 222* 199*  BUN 15 13  CREATININE 0.91 0.94  CALCIUM 10.3 10.2  MG -- --  PHOS -- --   Liver Function Tests:  Lab 08/18/12 0455 08/17/12 2346  AST 157* 163*  ALT 151* 151*  ALKPHOS 215* 208*  BILITOT 1.8*  1.8*  PROT 7.6 7.4  ALBUMIN 3.4* 3.3*    Lab 08/17/12 2108  LIPASE 67*  AMYLASE --   CBC:  Lab 08/18/12 0455 08/17/12 2345 08/17/12 2108  WBC 7.9 8.5 --  NEUTROABS -- -- 6.2  HGB 10.9* 10.3* --  HCT 31.0* 29.0* --  MCV 96.3 95.4 --  PLT 136* 134* --   CBG:  Lab 08/18/12 0841 08/17/12 2304 08/17/12 2209 08/17/12 1726 08/17/12 1629 08/17/12 1447  GLUCAP 169* 184* 171* 128* 75 66*   Thyroid Function Tests:  Lab 08/17/12 1616  TSH 134.166*  T4TOTAL --  FREET4 0.22*  T3FREE --  THYROIDAB --   Coagulation:  Lab 08/17/12 2108  LABPROT 13.1  INR 1.00   Anemia Panel:  Lab 08/17/12 1616  VITAMINB12 883  FOLATE --  FERRITIN 125  TIBC 393  IRON 144  RETICCTPCT 2.6*   Urine Drug Screen: Drugs of Abuse     Component Value Date/Time   LABOPIA NONE DETECTED 08/18/2012 0026   COCAINSCRNUR NONE DETECTED 08/18/2012 0026   LABBENZ NONE DETECTED 08/18/2012 0026   AMPHETMU NONE DETECTED 08/18/2012 0026   THCU NONE DETECTED 08/18/2012 0026   LABBARB NONE DETECTED 08/18/2012 0026    Urinalysis:  Lab 08/18/12 0026  COLORURINE YELLOW  LABSPEC 1.008  PHURINE 7.0  GLUCOSEU NEGATIVE  HGBUR NEGATIVE  BILIRUBINUR NEGATIVE  KETONESUR NEGATIVE  PROTEINUR 30*  UROBILINOGEN 1.0  NITRITE NEGATIVE  LEUKOCYTESUR SMALL*    Micro Results: Recent Results (from the past 240 hour(s))  MRSA PCR SCREENING     Status: Normal   Collection Time   08/18/12  1:13 AM      Component Value Range Status Comment   MRSA by PCR NEGATIVE  NEGATIVE Final    Studies/Results: Mr [name] Contrast  08/18/2012  *RADIOLOGY REPORT*  Clinical Data:  51 year old female with dizziness increasing in severity.  Vertigo.  Comparison: None.  MRI HEAD WITHOUT CONTRAST  Technique: Multiplanar, multiecho pulse sequences of the brain and surrounding structures were obtained according to standard protocol without intravenous contrast.  Findings: No abnormal or restricted diffusion to indicate acute  infarct. No midline shift, ventriculomegaly, mass effect, evidence of mass lesion, extra-axial collection or acute intracranial hemorrhage.  Cervicomedullary junction and pituitary are within normal limits.  Major intracranial vascular flow voids are preserved.   Mild for age scattered nonspecific cerebral white matter T2 and FLAIR hyperintensity.  Otherwise gray and white matter signal is within normal limits.  No cortical encephalomalacia.  Negative visualized cervical spine.  Noncontrast IAC images demonstrate no mass associated with the cisternal or intracanilicular seventh or eighth cranial nerves segments.  Signal in the bilateral cochlea and vestibular structures appears preserved.  There is minimal mastoid air cell fluid, greater on the right. There is also right petrous apex fluid suspected.  Negative visualized nasopharynx.  Paranasal sinuses are clear. Visualized orbit soft tissues are within normal limits.  Negative scalp soft tissues. Visualized bone marrow signal is within normal limits.  IMPRESSION: 1.  Mild mastoid and right petrous apex fluid, probably post inflammatory and usually not of clinical significance. 2. No acute intracranial abnormality.  Mild nonspecific cerebral white matter signal changes. 3.  MRA findings are below.  MRA HEAD WITHOUT CONTRAST  Technique: Angiographic images of the Circle of Willis were obtained using MRA technique without  intravenous contrast.  Findings: Antegrade flow in the posterior circulation with codominant distal vertebral arteries.  Both PICA vessels are patent.  Tortuous but otherwise normal vertebrobasilar junction and proximal basilar artery.  No basilar stenosis.  SCA and PCA origins are within normal limits.  Both posterior communicating arteries are present.  Suggestion of right PCA P2 segment narrowing on the MIP images is not correlated on the source images.  Bilateral PCA branches are within normal limits.  Antegrade flow in both ICA siphons.  There  may be a small persistent right trigeminal artery (series 10 image 62) or less likely a prominent venous vessel simulating that appearance.  Mild irregularity of the supraclinoid segments greater on the left compatible with atherosclerosis.  No ICA stenosis.  Ophthalmic and posterior communicating artery origins are within normal limits; right posterior communicating artery infundibulum.  Carotid termini are patent.  MCA and ACA origins are within normal limits.  Diminutive anterior communicating artery.  Visualized ACA branches are within normal limits.  Visualized bilateral MCA branches are within normal limits.  IMPRESSION: 1.  Negative posterior circulation except for possible small persistent right trigeminal artery (normal anatomic variant). 2.  Supraclinoid ICA atherosclerosis without significant ICA stenosis.  Small right posterior communicating artery infundibulum. Otherwise negative anterior circulation.   Original Report Authenticated By: Roselyn Reef, M.D.    Mr Brain Wo Contrast  08/18/2012  *RADIOLOGY REPORT*  Clinical Data:  51 year old female with dizziness increasing in severity.  Vertigo.  Comparison: None.  MRI HEAD WITHOUT CONTRAST  Technique: Multiplanar, multiecho pulse sequences of  the brain and surrounding structures were obtained according to standard protocol without intravenous contrast.  Findings: No abnormal or restricted diffusion to indicate acute infarct. No midline shift, ventriculomegaly, mass effect, evidence of mass lesion, extra-axial collection or acute intracranial hemorrhage.  Cervicomedullary junction and pituitary are within normal limits.  Major intracranial vascular flow voids are preserved.   Mild for age scattered nonspecific cerebral white matter T2 and FLAIR hyperintensity.  Otherwise gray and white matter signal is within normal limits.  No cortical encephalomalacia.  Negative visualized cervical spine.  Noncontrast IAC images demonstrate no mass associated with  the cisternal or intracanilicular seventh or eighth cranial nerves segments.  Signal in the bilateral cochlea and vestibular structures appears preserved.  There is minimal mastoid air cell fluid, greater on the right. There is also right petrous apex fluid suspected.  Negative visualized nasopharynx.  Paranasal sinuses are clear. Visualized orbit soft tissues are within normal limits.  Negative scalp soft tissues. Visualized bone marrow signal is within normal limits.  IMPRESSION: 1.  Mild mastoid and right petrous apex fluid, probably post inflammatory and usually not of clinical significance. 2. No acute intracranial abnormality.  Mild nonspecific cerebral white matter signal changes. 3.  MRA findings are below.  MRA HEAD WITHOUT CONTRAST  Technique: Angiographic images of the Circle of Willis were obtained using MRA technique without  intravenous contrast.  Findings: Antegrade flow in the posterior circulation with codominant distal vertebral arteries.  Both PICA vessels are patent.  Tortuous but otherwise normal vertebrobasilar junction and proximal basilar artery.  No basilar stenosis.  SCA and PCA origins are within normal limits.  Both posterior communicating arteries are present.  Suggestion of right PCA P2 segment narrowing on the MIP images is not correlated on the source images.  Bilateral PCA branches are within normal limits.  Antegrade flow in both ICA siphons.  There may be a small persistent right trigeminal artery (series 10 image 62) or less likely a prominent venous vessel simulating that appearance.  Mild irregularity of the supraclinoid segments greater on the left compatible with atherosclerosis.  No ICA stenosis.  Ophthalmic and posterior communicating artery origins are within normal limits; right posterior communicating artery infundibulum.  Carotid termini are patent.  MCA and ACA origins are within normal limits.  Diminutive anterior communicating artery.  Visualized ACA branches are  within normal limits.  Visualized bilateral MCA branches are within normal limits.  IMPRESSION: 1.  Negative posterior circulation except for possible small persistent right trigeminal artery (normal anatomic variant). 2.  Supraclinoid ICA atherosclerosis without significant ICA stenosis.  Small right posterior communicating artery infundibulum. Otherwise negative anterior circulation.   Original Report Authenticated By: Roselyn Reef, M.D.    US Abdomen Complete  08/18/2012  *RADIOLOGY REPORT*  Clinical Data:  History of elevated liver function tests. History of prior appendectomy.  ABDOMINAL ULTRASOUND COMPLETE  Comparison:  CT 07/27/2008.  Findings:  Gallbladder: No shadowing gallstones or echogenic sludge. No gallbladder wall thickening or pericholecystic fluid. The gallbladder wall thickness measured 2.1 mm. No sonographic Murphy's sign according to the ultrasound technologist.  CBD: Normal in caliber measuring 3.6 mm. No choledocholithiasis is evident.  Liver:  Normal size and echotexture without focal parenchymal abnormality. Portal vein is patent with hepatopetal flow.  IVC:  Patent throughout its visualized course in the abdomen.  Pancreas:  Although the pancreas is difficult to visualize in its entirety, no focal pancreatic abnormality is identified.  Spleen:  Normal size and echotexture without focal abnormality. Length  is 6.1  Right kidney:  No hydronephrosis.  Well-preserved cortex.  Normal parenchymal echotexture without focal abnormalities.  Right renal length is 10.9 cm.  Left kidney:  No hydronephrosis.  Well-preserved cortex.  Normal parenchymal echotexture without focal abnormalities.  Left renal length is 10.6 cm.  Aorta:  Maximum diameter is 2.2 cm.  No aneurysm is evident.  Ascites:  None.  IMPRESSION: No abdominal pathology was demonstrated.   Original Report Authenticated By: Shanon Brow Call    Medications: I have reviewed the patient's current medications. Scheduled Meds:   . amitriptyline   150 mg Oral QHS  . aspirin  325 mg Oral Once  . enoxaparin (LOVENOX) injection  40 mg Subcutaneous Q24H  . [COMPLETED] flumazenil  0.2 mg Intravenous STAT  . insulin aspart  0-15 Units Subcutaneous TID WC  . insulin glargine  20 Units Subcutaneous QHS  . levothyroxine  50 mcg Intravenous QAC breakfast  . [COMPLETED] LORazepam  2 mg Oral Once  . sodium chloride  3 mL Intravenous Q12H  . [DISCONTINUED] amitriptyline  150 mg Oral QHS  . [DISCONTINUED] enoxaparin (LOVENOX) injection  30 mg Subcutaneous Q24H  . [DISCONTINUED] levothyroxine  38 mcg Intravenous Daily  . [DISCONTINUED] levothyroxine  50 mcg Intravenous QAC breakfast  . [DISCONTINUED] levothyroxine  75 mcg Oral QAC breakfast   Continuous Infusions:  PRN Meds:.sodium chloride, sodium chloride, [DISCONTINUED] zolpidem Assessment/Plan: Kayla [name] is a 51 y.o w PMH of poorly controlled hypothyroidism s/p ablation, uncontrolled DM with neuropathy, HLD, obesity, depression, and fibromyalgia presenting with several days of acute worsening of chronic dizziness concerning for posterior circulation stroke vs Meniere's disease vs BPPV.   1) Dizziness  Kayla [name] presents with acute worsening of chronic dizziness. Her description of the room spinning and precipitation of symptoms with positional change is suggestive of benign paroxysmal positional vertigo. However, she also describes right-sided ear fullness tinnitus and decreased hearing acuity, which be consistent with Mnire's disease. MRI/MRA was negative for any posterior circulation infarct/anomaly. PT/OT did not find any clear vestibular cause of symptoms. Recommend home PT 3x week to assess falls. - Home PT - Meclizine for symptomatic treatment  2) Elevated liver function tests  Patient presents with AST of 191, ALT of 173, total bilirubin of 2, and alkaline phosphatase of 227. These were not present on prior laboratory testing. Etiology is not clear at this point. Pattern would be  consistent with cholestasis, however there are no clinical signs or symptoms to support this diagnosis. She denies any abdominal pain and she has no tenderness on exam, in particular Murphy sign was negative. She also has no nausea or vomiting. She does have a history of autoimmune disease, including Graves disease. She had a positive ANA in 2011 with a titer of 1:1280. She had negative double stranded DNA anti-ENA at the time. Given this history, she could be at risk for autoimmune hepatitis, but LFT elevation is fairly modest. She denies any ingestions. She denies any alcohol use.  - GGT is 1019, abdominal ultrasound shows no pathology - Hepatitis serologies negative, HIV nonreactive - likely will need follow-up as outpatient. No acute interventions  3) Hypothyroidism  Patient with history of Graves' disease status post ablation x2 now with very poorly controlled hypothyroidism. Last TSH in October of this year was 107 and free T4 was 0.28. Patient reports intermittent compliance with Synthroid, she says it makes her feel "yucky." Supposed to be taking 51mcg every other day.  -TSH 134 and free T4 0.22 -Will restart  Synthroid 53mcg daily, will reinforce necessity of compliance with medications the patient prior to discharge   4) Type 2 diabetes mellitus, uncontrolled  Patient's last hemoglobin A1c was 11.8 in October of this year, up from 6.3 in June of this year. She reports taking Lantus 40 units at night and playful NovoLog with meals. I'm concerned that patient may not be taking his medications correctly, as at first she reported to me that she was taking 40 units of NovoLog at night and Lantus with meals. She was hypoglycemic with blood sugar 56 in the clinic.  - Lantus 20U, SSI coverage - educated about appropriate insulin use. Will follow up.    5) Fibromyalgia  Patient reports a history of fibromyalgia for which she takes amitriptyline daily. She does not have documented rheumatologic  disease. She has no joint effusions examination today.  -Continue amitriptyline while patient   6) Chronic normocytic anemia  On patient's prior clinic visit in October she was noted to have a hemoglobin of 11.9 with normal MCV of 94. This appears to be above her baseline of around 9 per chart review. May be 2/2 chronic disease in setting of uncontrolled DM w nephropathy. Repeat CBC, iron studies, retic ct, B12, and folate. It appears that she's had anemia workup in the past, but this issue does not appear to have been addressed in clinic visits over the past few years.  - Iron panel/vit B unremarkable, folate pending  Dispo: Disposition is deferred at this time, awaiting improvement of current medical problems.  Anticipated discharge in approximately  day(s).   The patient does have a current PCP (SCHOOLER, KAREN, MD), therefore will be requiring [facility]PC follow-up after discharge.   The patient does not have transportation limitations that hinder transportation to clinic appointments.  .Services Needed at time of discharge: Y = Yes, Blank = No PT:   OT:   RN:   Equipment:   Other:     LOS: 1 day   Tonia Brooms 08/18/2012, 11:43 AM

## 2012-08-18 NOTE — Progress Notes (Signed)
Pt found in bed with food all over her chest and bed very sedated,unrespnsive to voice but responsive to  painful stimuli,breathing through her mouth snoring VS BP 119/60,Hr 101 O2 sat 96% on room air,started oxygen at 2L, O2 up to 100%,cbg 171,attempted to start IV but she pulls her arms back,IV RN paged.

## 2012-08-18 NOTE — Progress Notes (Signed)
I saw patient and discussed her care with resident Dr. Aundra Dubin.  I agree with the plan to admit patient as outlined in her note.

## 2012-08-19 LAB — ANEMIA PANEL
%SAT: 37 % (ref 20–55)
ABS Retic: 82.9 10*3/uL (ref 19.0–186.0)
Ferritin: 125 ng/mL (ref 10–291)
Folate: 12.4 ng/mL (ref >5.4)
Iron: 144 ug/dL (ref 42–145)
RBC.: 3.19 MIL/uL — ABNORMAL LOW (ref 3.87–5.11)
Retic Ct Pct: 2.6 % — ABNORMAL HIGH (ref 0.4–2.3)
TIBC: 393 ug/dL (ref 250–470)
UIBC: 249 ug/dL (ref 125–400)
Vitamin B-12: 883 pg/mL (ref 211–911)

## 2012-08-19 NOTE — Discharge Summary (Signed)
Internal Medicine Teaching Service Attending Note Date: 08/19/2012  Patient name: Kayla [name]  Medical record number: [id number]  Date of birth: December 24, 1960   I evaluated the patient and discussed the discharge plan with my resident team.  I agree with the discharge documentation and disposition.  Thank you for working with me on this patient's care.    Thanks Madilyn Fireman 08/19/2012, 4:02 PM

## 2012-08-20 ENCOUNTER — Other Ambulatory Visit: Payer: Self-pay | Admitting: Internal Medicine

## 2012-08-20 LAB — URINE CULTURE: Colony Count: >=100000

## 2012-08-20 MED ORDER — CIPROFLOXACIN HCL 250 MG PO TABS: 250.0000 mg | ORAL_TABLET | Freq: Two times a day (BID) | ORAL | Status: AC

## 2012-08-24 ENCOUNTER — Encounter: Payer: Self-pay | Admitting: Internal Medicine

## 2012-08-24 ENCOUNTER — Ambulatory Visit (INDEPENDENT_AMBULATORY_CARE_PROVIDER_SITE_OTHER): Payer: Self-pay | Admitting: Internal Medicine

## 2012-08-24 VITALS — BP 146/77 | HR 82 | Temp 97.0°F | Ht 62.0 in | Wt 170.6 lb

## 2012-08-24 DIAGNOSIS — E1165 Type 2 diabetes mellitus with hyperglycemia: Secondary | ICD-10-CM

## 2012-08-24 DIAGNOSIS — H9190 Unspecified hearing loss, unspecified ear: Secondary | ICD-10-CM

## 2012-08-24 DIAGNOSIS — R7401 Elevation of levels of liver transaminase levels: Secondary | ICD-10-CM

## 2012-08-24 DIAGNOSIS — Z658 Other specified problems related to psychosocial circumstances: Secondary | ICD-10-CM

## 2012-08-24 DIAGNOSIS — G621 Alcoholic polyneuropathy: Secondary | ICD-10-CM

## 2012-08-24 DIAGNOSIS — E1142 Type 2 diabetes mellitus with diabetic polyneuropathy: Secondary | ICD-10-CM

## 2012-08-24 DIAGNOSIS — E1140 Type 2 diabetes mellitus with diabetic neuropathy, unspecified: Secondary | ICD-10-CM

## 2012-08-24 DIAGNOSIS — R42 Dizziness and giddiness: Secondary | ICD-10-CM

## 2012-08-24 DIAGNOSIS — R296 Repeated falls: Secondary | ICD-10-CM

## 2012-08-24 DIAGNOSIS — Z733 Stress, not elsewhere classified: Secondary | ICD-10-CM

## 2012-08-24 DIAGNOSIS — M792 Neuralgia and neuritis, unspecified: Secondary | ICD-10-CM

## 2012-08-24 DIAGNOSIS — E1149 Type 2 diabetes mellitus with other diabetic neurological complication: Secondary | ICD-10-CM

## 2012-08-24 LAB — GLUCOSE, CAPILLARY: Glucose-Capillary: 101 mg/dL — ABNORMAL HIGH (ref 70–99)

## 2012-08-24 MED ORDER — GABAPENTIN 600 MG PO TABS: 600.0000 mg | ORAL_TABLET | Freq: Three times a day (TID) | ORAL | Status: DC

## 2012-08-24 NOTE — Patient Instructions (Signed)
Please make a follow up appointment in 2-3 weeks. Follow up with ENT.  Keep taking all medications regularly. Start taking Metformin 1000 mg twice daily.

## 2012-08-24 NOTE — Assessment & Plan Note (Signed)
Refer to social worker to help with recent stress started with unemployment.

## 2012-08-24 NOTE — Progress Notes (Signed)
  Subjective:    Patient ID: Kayla [name], female    DOB: 1961/07/23, 51 y.o.   MRN: [id number]  HPI patient is a 51 year woman with hypothyroidism, DM 2, depression,  dizziness who comes the clinic for hospital followup visit. Patient was recently admitted to hospital for dizziness and discharged on 08/17/2012. She reports having continuous dizziness- not related to any position. It's getting worse lately and is associated with falls. Recent extensive workup in hospital including MRI/MRA brain negative for any pathology. Does not fit diagnosis for BPPV also. Had audiometry done last year- but did not followup with ENT or vestibular rehabilitation due to financial issues.  Patient denies any fever, chills, nausea, vomiting, abdominal pain, chest pain, short of breath.  She says that she has started taking Synthroid since hospital discharge although she does not like taking it. Also she takes Lantus 40 units at bedtime and NovoLog 3- 5 units before meal. Also she has not been taking metformin since hospital discharge as she thought that she will be switched to metformin once her A1c gets better.  She wants help with her recent psychosocial stressors which has been associated with her losing job. Will refer her to our Education officer, museum.   Review of Systems    as per history of present illness. Objective:   Physical Exam  General: Mildly anxious patient. HEENT: PERRL, EOMI, no scleral icterus. Bilateral cerumen impaction. Right tympanic membrane visible and clear. Left TM not visible. No discharge. Cardiac: S1, S2, RRR, no rubs, murmurs or gallops Pulm: clear to auscultation bilaterally, moving normal volumes of air Abd: soft, nontender, nondistended, BS present Ext: warm and well perfused, no pedal edema Neuro: alert and oriented X3, cranial nerves II-XII grossly intact       Assessment & Plan:

## 2012-08-24 NOTE — Assessment & Plan Note (Signed)
Patient in Lantus and NovoLog as expected but not taking metformin since hospital discharge. Advised her to start taking metformin again. She verbalized understanding.

## 2012-08-24 NOTE — Assessment & Plan Note (Addendum)
Chronic issue associated with hearing loss. Audiometric evaluation done last year. Did not have followup with ENT or vestibular rehabilitation due to financial issues. Due to increasing falls dizziness- refer to ENT for further evaluation with audiometric exam and possible vestibular rehabilitation. Might consider working up for chronic Lyme disease in future considering patient's overall symptomatology.

## 2012-08-25 LAB — COMPLETE METABOLIC PANEL WITH GFR
ALT: 136 U/L — ABNORMAL HIGH (ref 0–35)
AST: 135 U/L — ABNORMAL HIGH (ref 0–37)
Albumin: 3.9 g/dL (ref 3.5–5.2)
Alkaline Phosphatase: 215 U/L — ABNORMAL HIGH (ref 39–117)
BUN: 14 mg/dL (ref 6–23)
CO2: 28 mEq/L (ref 19–32)
Calcium: 10.7 mg/dL — ABNORMAL HIGH (ref 8.4–10.5)
Chloride: 103 mEq/L (ref 96–112)
Creat: 1.13 mg/dL — ABNORMAL HIGH (ref 0.50–1.10)
GFR, Est African American: 65 mL/min
GFR, Est Non African American: 57 mL/min — ABNORMAL LOW
Glucose, Bld: 59 mg/dL — ABNORMAL LOW (ref 70–99)
Potassium: 3.9 mEq/L (ref 3.5–5.3)
Sodium: 136 mEq/L (ref 135–145)
Total Bilirubin: 1.4 mg/dL — ABNORMAL HIGH (ref 0.3–1.2)
Total Protein: 7.7 g/dL (ref 6.0–8.3)

## 2012-08-25 NOTE — Progress Notes (Signed)
Quick Note:  Urine Culture growing sensitive E Coli. Dr. Dorian Heckle called in a course of Ciprofloxacin for the patient the day I received this lab report. Ms Byer has already picked it up from [facility]. ______

## 2012-09-01 ENCOUNTER — Telehealth: Payer: Self-pay | Admitting: Licensed Clinical Social Worker

## 2012-09-01 ENCOUNTER — Telehealth: Payer: Self-pay | Admitting: *Deleted

## 2012-09-01 NOTE — Telephone Encounter (Signed)
Pt states now living in [place] - never got Rx for Cipro - wants Rx called into [facility]. Rx was called in for  Cipro 250mg  #6 per Dr Sandy Salaam 08/20/12. Hilda Blades Ociel Retherford RN 09/01/12 3PM

## 2012-09-01 NOTE — Telephone Encounter (Signed)
Kayla [name] was referred to CSW for community resources for financial/social stressors.  CSW placed called to pt.  CSW unable to leave message as pt's voice mailbox was full.  CSW will refer Kayla [name] to [employer] and [employer] for [employer] and MAP for add'l prescription assistance.

## 2012-09-01 NOTE — Telephone Encounter (Signed)
CSW received return call from Ms. Elliston.  Pt states she is currently behind on her rent, utilities are paid up.  Ms. Takala went to [employer] and [employer] and was denied rental assistance due to being unemployed.  Pt has not previously applied for public housing, wait list is currently closed.  Pt was given shelter information from above agencies. Pt church declined financial assistance.  Ms. Gieck is concerned as to where she will keep her belongings if she has to go to a shelter.  Pt has a 49 year old son, who has moved and is staying with a friend.  CSW referred Ms. Boatwright to [employer].  Informing Ms. Cumbo [facility] specializes with the homeless or close to homeless populations and may have add'l resources.  Informed Ms. Lebo to apply for MAP, as some pharmaceutical companies may be able to offer her medications free of charge.  Unfortunately, CSW is unaware of additional financial assistance programs at this time.

## 2012-09-02 ENCOUNTER — Encounter: Payer: Self-pay | Admitting: Licensed Clinical Social Worker

## 2012-09-04 ENCOUNTER — Ambulatory Visit (INDEPENDENT_AMBULATORY_CARE_PROVIDER_SITE_OTHER): Payer: No Typology Code available for payment source | Admitting: Obstetrics & Gynecology

## 2012-09-04 ENCOUNTER — Encounter: Payer: Self-pay | Admitting: Obstetrics & Gynecology

## 2012-09-04 VITALS — BP 149/91 | HR 72 | Temp 97.7°F | Resp 12 | Ht 62.0 in | Wt 173.0 lb

## 2012-09-04 DIAGNOSIS — N951 Menopausal and female climacteric states: Secondary | ICD-10-CM

## 2012-09-04 DIAGNOSIS — R87619 Unspecified abnormal cytological findings in specimens from cervix uteri: Secondary | ICD-10-CM

## 2012-09-04 DIAGNOSIS — IMO0002 Reserved for concepts with insufficient information to code with codable children: Secondary | ICD-10-CM | POA: Insufficient documentation

## 2012-09-04 LAB — FOLLICLE STIMULATING HORMONE: FSH: 8.2 m[IU]/mL

## 2012-09-04 NOTE — Progress Notes (Signed)
Patient ID: Kayla [name], female   DOB: 1960-11-13, 51 y.o.   MRN: [id number]  Chief Complaint  Patient presents with  . Abnormal Pap Smear    HPI Kayla [name] is a 51 y.o. female.  Referred for f/u pap after she had AGUS in 2010, with colposcopy and EMB benign. Last pap 2012 normal. HPI  Past Medical History  Diagnosis Date  . ANA POSITIVE 02/09/2010  . Type 2 diabetes mellitus   . Hyperlipidemia   . Dizziness     Multiple falls at home  . Diabetic neuropathy     Of hands and feet  . Sickle cell trait   . Fibromyalgia   . Hypothyroidism   . Deafness in right ear 2012    in the process of treatment    Past Surgical History  Procedure Date  . Appendectomy     Family History  Problem Relation Age of Onset  . Emphysema Mother   . Cancer Mother     unknown  . Hypertension Mother   . Cancer Father     pancreatic   . Diabetes Father   . Hypertension Father   . Down syndrome Brother   . Cancer Maternal Aunt     cancer  . Cancer Other     breast cancer aunt  . Stroke Sister   . Diabetes Sister     Social History History  Substance Use Topics  . Smoking status: Former Smoker -- 5 years    Types: Cigarettes    Quit date: 09/30/1990  . Smokeless tobacco: Not on file  . Alcohol Use: No     Comment: Former use, denies heavy use/dependence/abuse    Allergies  Allergen Reactions  . Ace Inhibitors   . Sulfites Rash    Current Outpatient Prescriptions  Medication Sig Dispense Refill  . ACCU-CHEK FASTCLIX LANCETS MISC 1 each by Does not apply route 4 (four) times daily -  before meals and at bedtime.  100 each  11  . Blood Glucose Monitoring Suppl (ACCU-CHEK AVIVA PLUS) W/DEVICE KIT 1 kit by Does not apply route as directed.  1 kit  0  . Blood Glucose Monitoring Suppl (ACCU-CHEK NANO SMARTVIEW) W/DEVICE KIT 1 each by Does not apply route 4 (four) times daily -  before meals and at bedtime.  1 kit  0  . gabapentin (NEURONTIN) 600 MG tablet Take 1 tablet (600 mg  total) by mouth 3 (three) times daily.  90 tablet  11  . insulin aspart (NOVOLOG FLEXPEN) 100 UNIT/ML injection Inject 3-5 Units into the skin 3 (three) times daily before meals.      . insulin glargine (LANTUS SOLOSTAR) 100 UNIT/ML injection Inject 40 units at bedtime  15 mL  10  . levothyroxine (SYNTHROID) 75 MCG tablet Take 1 tablet (75 mcg total) by mouth daily.  30 tablet  11  . metFORMIN (GLUCOPHAGE) 1000 MG tablet Take 1 tablet (1,000 mg total) by mouth 2 (two) times daily with a meal.  60 tablet  6  . zolpidem (AMBIEN) 10 MG tablet Take 1 tablet (10 mg total) by mouth at bedtime as needed.  30 tablet  3  . meclizine (ANTIVERT) 25 MG tablet Take 1 tablet (25 mg total) by mouth every 6 (six) hours as needed for dizziness.  20 tablet  0  Implanon in place 2 years. Rare bleeding. Norplant in place left arm approximately 20 years  Review of Systems Review of Systems  Genitourinary: Positive for vaginal bleeding (spotting  today). Negative for vaginal discharge and pelvic pain.    Blood pressure 149/91, pulse 72, temperature 97.7 F (36.5 C), temperature source Oral, resp. rate 12, height 5\' 2"  (1.575 m), weight 173 lb (78.472 kg).  Physical Exam Physical Exam  Constitutional: She appears well-developed and well-nourished. No distress.  Abdominal: Soft.  Genitourinary: Vagina normal. No vaginal discharge found.       Dark blood, cervix normal, pap done  Skin: Skin is warm and dry.  Psychiatric: She has a normal mood and affect. Her behavior is normal.    Data Reviewed Pap and biopsies  Assessment    H/O AGUS pap Irregular menses with Implanon Perimenopause    Plan    Yearly pap smear   [place] Needs plan to deal with subdermal implants in her arms    ARNOLD,JAMES 09/04/2012, 11:54 AM

## 2012-09-04 NOTE — Patient Instructions (Addendum)
Contraceptive Implant Information A contraceptive implant is a plastic rod that is inserted under the skin. It is usually inserted under the skin of your upper arm. It continually releases small amounts of progestin (synthetic progesterone) into the bloodstream. This prevents an egg from being released from the ovary. It also thickens the cervical mucus to prevent sperm from entering the cervix, and it thins the uterine lining to prevent a fertilized egg from attaching to the uterus. They can be effective for up to 3 years. Implants do not provide protection against sexually transmitted diseases (STDs).  The procedure to insert an implant usually takes about 10 minutes. There may be minor bruising, swelling, and discomfort at the insertion site for a couple days. The implant begins to work within the first day. Other contraceptive protection should be used for 2 weeks. Follow up with your caregiver to get rechecked as directed. Your caregiver will make sure you are a good candidate for the contraceptive implant. Discuss with your caregiver the possible side effects of the implant ADVANTAGES  It prevents pregnancy for up to 3 years.  It is easily reversible.  It is convenient.  The progestins may protect against uterine and ovarian cancer.  It can be used when breastfeeding.  It can be used by women who cannot take estrogen. DISADVANTAGES  You may have irregular or unplanned vaginal bleeding.  You may develop side effects, including headache, weight gain, acne, breast tenderness, or mood changes.  You may have tissue or nerve damage after insertion (rare).  It may be difficult and uncomfortable to remove.  Certain medications may interfere with the effectiveness of the implants. REMOVAL OF IMPLANT The implant should be removed in 3 years or as directed by your caregiver. The implants effect wears off in a few hours after removal. Your ability to get pregnant (fertility) is restored  within a couple of weeks. New implants can be inserted as soon as the old ones are removed if desired. DO NOT GET THE IMPLANT IF:   You are pregnant.  You have a history of breast cancer, osteoporosis, blood clots, heart disease, diabetes, high blood pressure, liver disease, tumors, or stroke.   You have undiagnosed vaginal bleeding.  You have overly sensitive to certain parts of the implant. Document Released: 09/05/2011 Document Revised: 12/09/2011 Document Reviewed: 09/05/2011 [facility] Patient Information 2013 [place].

## 2012-09-28 ENCOUNTER — Ambulatory Visit (AMBULATORY_SURGERY_CENTER): Payer: Self-pay | Admitting: *Deleted

## 2012-09-28 VITALS — Ht 62.0 in | Wt 172.8 lb

## 2012-09-28 DIAGNOSIS — Z1211 Encounter for screening for malignant neoplasm of colon: Secondary | ICD-10-CM

## 2012-10-12 ENCOUNTER — Ambulatory Visit (AMBULATORY_SURGERY_CENTER): Payer: Self-pay | Admitting: Internal Medicine

## 2012-10-12 ENCOUNTER — Encounter: Payer: Self-pay | Admitting: Internal Medicine

## 2012-10-12 VITALS — BP 134/69 | HR 63 | Temp 97.9°F | Resp 15

## 2012-10-12 DIAGNOSIS — R933 Abnormal findings on diagnostic imaging of other parts of digestive tract: Secondary | ICD-10-CM

## 2012-10-12 DIAGNOSIS — Z538 Procedure and treatment not carried out for other reasons: Secondary | ICD-10-CM

## 2012-10-12 DIAGNOSIS — K6289 Other specified diseases of anus and rectum: Secondary | ICD-10-CM

## 2012-10-12 DIAGNOSIS — Z1211 Encounter for screening for malignant neoplasm of colon: Secondary | ICD-10-CM

## 2012-10-12 LAB — GLUCOSE, CAPILLARY
Glucose-Capillary: 166 mg/dL — ABNORMAL HIGH (ref 70–99)
Glucose-Capillary: 170 mg/dL — ABNORMAL HIGH (ref 70–99)

## 2012-10-12 MED ORDER — SODIUM CHLORIDE 0.9 % IV SOLN: 500.0000 mL | INTRAVENOUS | Status: DC

## 2012-10-12 NOTE — Progress Notes (Signed)
Called to room to assist during endoscopic procedure.  Patient ID and intended procedure confirmed with present staff. Received instructions for my participation in the procedure from the performing physician.  

## 2012-10-12 NOTE — Op Note (Signed)
[facility]  [address] [place], [address]   COLONOSCOPY PROCEDURE REPORT  PATIENT: Kayla [name], Kayla [name]  MR#: [id number] BIRTHDATE: [birth year] , 51  yrs. old GENDER: Female ENDOSCOPIST: Gatha Mayer, MD, [facility] REFERRED BY:   Dorian Heckle, MD PROCEDURE DATE:  10/12/2012 PROCEDURE:   Colonoscopy with biopsy ASA CLASS:   Class II INDICATIONS:average risk screening. MEDICATIONS: propofol (Diprivan) 200mg  IV, MAC sedation, administered by CRNA, and These medications were titrated to patient response per physician's verbal order  DESCRIPTION OF PROCEDURE:   After the risks benefits and alternatives of the procedure were thoroughly explained, informed consent was obtained.  A digital rectal exam revealed no abnormalities of the rectum.   The LB [id number]  endoscope was introduced through the anus and advanced to the cecum, which was identified by both the appendix and ileocecal valve. No adverse events experienced.   The quality of the prep was Suprep poor  The instrument was then slowly withdrawn as the colon was fully examined.      COLON FINDINGS: Abnormal mucosa was found in the anal canal, it was nodular.  Multiple biopsies were performed using cold forceps. The colon mucosa was otherwise poorly seen due to poor prep. Retroflexed views revealed nodular anal mucosa. The time to cecum=2 minutes 45 seconds.  Withdrawal time=9 minutes 24 seconds.  The scope was withdrawn and the procedure completed. COMPLICATIONS: There were no complications.  ENDOSCOPIC IMPRESSION: 1.   Abnormal mucosa (nodular) was found in the anal canal biopsies were performed using cold forceps 2.   The colon mucosa was not seen well due to poor prep  RECOMMENDATIONS: 1.  Office will call with the biopsy results. 2.   Will need a repeat colonoscopy with more prep (? double).   eSigned:  Gatha Mayer, MD, [facility] 10/12/2012 11:24 AM   cc: The Patient    and Dorian Heckle,  MD

## 2012-10-12 NOTE — Progress Notes (Signed)
Patient did not have preoperative order for IV antibiotic SSI prophylaxis. (G8918)  Patient did not experience any of the following events: a burn prior to discharge; a fall within the facility; wrong site/side/patient/procedure/implant event; or a hospital transfer or hospital admission upon discharge from the facility. (G8907)  

## 2012-10-12 NOTE — Patient Instructions (Addendum)
The colonoscopy prep was not good enough to allow me to see the colon clearly - the colonoscopy will need to be repeated with extra prep most likely.  There were some changes to the lining of the anal canal that I took biopsies of.  My office will contact you with these results and plans for repeating the colonoscopy.  Thank you for choosing me and [facility].  Gatha Mayer, MD, FACG   YOU HAD AN ENDOSCOPIC PROCEDURE TODAY AT [facility]: Refer to the procedure report that was given to you for any specific questions about what was found during the examination.  If the procedure report does not answer your questions, please call your gastroenterologist to clarify.  If you requested that your care partner not be given the details of your procedure findings, then the procedure report has been included in a sealed envelope for you to review at your convenience later.  YOU SHOULD EXPECT: Some feelings of bloating in the abdomen. Passage of more gas than usual.  Walking can help get rid of the air that was put into your GI tract during the procedure and reduce the bloating. If you had a lower endoscopy (such as a colonoscopy or flexible sigmoidoscopy) you may notice spotting of blood in your stool or on the toilet paper. If you underwent a bowel prep for your procedure, then you may not have a normal bowel movement for a few days.  DIET: Your first meal following the procedure should be a light meal and then it is ok to progress to your normal diet.  A half-sandwich or bowl of soup is an example of a good first meal.  Heavy or fried foods are harder to digest and may make you feel nauseous or bloated.  Likewise meals heavy in dairy and vegetables can cause extra gas to form and this can also increase the bloating.  Drink plenty of fluids but you should avoid alcoholic beverages for 24 hours.  ACTIVITY: Your care partner should take you home directly after the procedure.   You should plan to take it easy, moving slowly for the rest of the day.  You can resume normal activity the day after the procedure however you should NOT DRIVE or use heavy machinery for 24 hours (because of the sedation medicines used during the test).    SYMPTOMS TO REPORT IMMEDIATELY: A gastroenterologist can be reached at any hour.  During normal business hours, 8:30 AM to 5:00 PM Monday through Friday, call [phone].  After hours and on weekends, please call the GI answering service at [phone] who will take a message and have the physician on call contact you.   Following lower endoscopy (colonoscopy or flexible sigmoidoscopy):  Excessive amounts of blood in the stool  Significant tenderness or worsening of abdominal pains  Swelling of the abdomen that is new, acute  Fever of 100F or higher    FOLLOW UP: If any biopsies were taken you will be contacted by phone or by letter within the next 1-3 weeks.  Call your gastroenterologist if you have not heard about the biopsies in 3 weeks.  Our staff will call the home number listed on your records the next business day following your procedure to check on you and address any questions or concerns that you may have at that time regarding the information given to you following your procedure. This is a courtesy call and so if there is no answer at the  home number and we have not heard from you through the emergency physician on call, we will assume that you have returned to your regular daily activities without incident.  SIGNATURES/CONFIDENTIALITY: You and/or your care partner have signed paperwork which will be entered into your electronic medical record.  These signatures attest to the fact that that the information above on your After Visit Summary has been reviewed and is understood.  Full responsibility of the confidentiality of this discharge information lies with you and/or your care-partner.

## 2012-10-13 ENCOUNTER — Telehealth: Payer: Self-pay

## 2012-10-13 NOTE — Telephone Encounter (Signed)
Left a message on the pt's a.m. At 7:43 at # [phone] to please call if any questions or concerns. Maw

## 2012-10-15 NOTE — Progress Notes (Signed)
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

## 2012-11-09 ENCOUNTER — Encounter: Payer: Self-pay | Admitting: Internal Medicine

## 2012-11-09 ENCOUNTER — Ambulatory Visit (INDEPENDENT_AMBULATORY_CARE_PROVIDER_SITE_OTHER): Payer: Self-pay | Admitting: Internal Medicine

## 2012-11-09 VITALS — BP 132/76 | HR 82 | Ht 62.0 in | Wt 167.0 lb

## 2012-11-09 DIAGNOSIS — K5909 Other constipation: Secondary | ICD-10-CM

## 2012-11-09 DIAGNOSIS — Z1211 Encounter for screening for malignant neoplasm of colon: Secondary | ICD-10-CM

## 2012-11-09 DIAGNOSIS — K5900 Constipation, unspecified: Secondary | ICD-10-CM

## 2012-11-09 DIAGNOSIS — A630 Anogenital (venereal) warts: Secondary | ICD-10-CM

## 2012-11-09 HISTORY — DX: Anogenital (venereal) warts: A63.0

## 2012-11-09 MED ORDER — MOVIPREP 100 G PO SOLR: ORAL | Status: DC

## 2012-11-09 NOTE — Patient Instructions (Addendum)
You have been scheduled for a colonoscopy with propofol. Please follow written instructions given to you at your visit today.  Please use the moviprep kit you have been given today. If you use inhalers (even only as needed) or a CPAP machine, please bring them with you on the day of your procedure.  Take Miralax twice a day.  Thank you for choosing me and [facility].  Gatha Mayer, M.D., [facility]

## 2012-11-09 NOTE — Progress Notes (Signed)
Subjective:    Patient ID: Kayla [name], female    DOB: [birth year], 52 y.o.   MRN: [id number]  HPI The patient presents in follow-up after an incomplete colonoscopy due to poor prep. She also had HPV anal warts diagnosed. She says she has had genital warts in past - treated by GYN. Reports chronic constipation moving her bowels 2-3 x/week x years.  Allergies  Allergen Reactions  . Ace Inhibitors     Dizziness, nausea  . Sulfites Rash   Outpatient Prescriptions Prior to Visit  Medication Sig Dispense Refill  . ACCU-CHEK FASTCLIX LANCETS MISC 1 each by Does not apply route 4 (four) times daily -  before meals and at bedtime.  100 each  11  . Blood Glucose Monitoring Suppl (ACCU-CHEK AVIVA PLUS) W/DEVICE KIT 1 kit by Does not apply route as directed.  1 kit  0  . Blood Glucose Monitoring Suppl (ACCU-CHEK NANO SMARTVIEW) W/DEVICE KIT 1 each by Does not apply route 4 (four) times daily -  before meals and at bedtime.  1 kit  0  . gabapentin (NEURONTIN) 600 MG tablet Take 1 tablet (600 mg total) by mouth 3 (three) times daily.  90 tablet  11  . insulin aspart (NOVOLOG FLEXPEN) 100 UNIT/ML injection Inject 3-5 Units into the skin 3 (three) times daily before meals.      . insulin glargine (LANTUS SOLOSTAR) 100 UNIT/ML injection Inject 40 units at bedtime  15 mL  10  . levothyroxine (SYNTHROID) 75 MCG tablet Take 1 tablet (75 mcg total) by mouth daily.  30 tablet  11  . meclizine (ANTIVERT) 25 MG tablet Take 1 tablet (25 mg total) by mouth every 6 (six) hours as needed for dizziness.  20 tablet  0  . metFORMIN (GLUCOPHAGE) 1000 MG tablet Take 1 tablet (1,000 mg total) by mouth 2 (two) times daily with a meal.  60 tablet  6  . zolpidem (AMBIEN) 10 MG tablet Take 1 tablet (10 mg total) by mouth at bedtime as needed.  30 tablet  3   No facility-administered medications prior to visit.   Past Medical History  Diagnosis Date  . ANA POSITIVE 02/09/2010  . Type 2 diabetes mellitus   . Hyperlipidemia    . Dizziness     Multiple falls at home  . Diabetic neuropathy     Of hands and feet  . Sickle cell trait   . Fibromyalgia   . Hypothyroidism   . Deafness in right ear 2012    in the process of treatment  Anal warts Past Surgical History  Procedure Laterality Date  . Appendectomy  1986  . Cesarean section  1986, 1993  Incomplete colonoscopy History   Social History  . Marital Status: Single    Spouse Name: N/A    Number of Children: N/A  . Years of Education: N/A   Social History Main Topics  . Smoking status: Former Smoker -- 5 years    Types: Cigarettes    Quit date: 09/30/1990  . Smokeless tobacco: Never Used  . Alcohol Use: No     Comment: Former use, denies heavy use/dependence/abuse  . Drug Use: No  . Sexually Active: Not Currently   Other Topics Concern  . None   Social History Narrative   Lives in [place], alone. Looking for a job. Has a son in town who checks in on her.     Review of Systems As above    Objective:   Physical Exam WDWN  NAD    Assessment & Plan:   1. Constipation, chronic    2. Special screening for malignant neoplasms, colon  Ambulatory referral to Gastroenterology   Ambulatory referral to Gastroenterology  3. Anal warts     1. MiraLax bid 2. Dulcolax #4 and 4 doses MiraLax day before colon prep 3. MoviPrep day before/off colonoscopy 4. Eventual surgical referral re: anal warts  [id number], Santiago Glad, MD

## 2012-11-16 ENCOUNTER — Telehealth: Payer: Self-pay | Admitting: *Deleted

## 2012-11-16 NOTE — Telephone Encounter (Signed)
Dr Michail Sermon will need to give input first since the attendings have not seen this pt. But since Dr Michail Sermon has not seen pt since OCt, the pt may need to make an appt for assessment.

## 2012-11-16 NOTE — Telephone Encounter (Signed)
[facility] calls to ask for additional information from her medical records in the form of eval for her falling and dizziness, has she ever seen a neurologist? If not they can pay for the visit, provide transportation visit would be to [facility]. Write referral on prescription pad sheet and fax it to: attn- tracy at [facility] fax # [phone], her ph# is [phone], after recing the fax she will call clinic and give info for the referral to be sent to [facility]. This will have to be done by an attending. i am sending this to attending of day and dr schooler

## 2012-11-17 NOTE — Telephone Encounter (Signed)
Pt called and states someone called her, i see nothing that anyone had tried to call her back but i did look at dr schooler's schedule and she does have opening 2/24, i ask pt if this would work for her for rt visit and re-eval for [facility], she is very agreeable and is scheduled for 2/24 at 1315, she is made aware of parking and travel to clinic and states she will come early enough to allow time to be on time for visit

## 2012-11-23 ENCOUNTER — Encounter: Payer: Self-pay | Admitting: Internal Medicine

## 2012-11-23 ENCOUNTER — Ambulatory Visit (INDEPENDENT_AMBULATORY_CARE_PROVIDER_SITE_OTHER): Payer: Self-pay | Admitting: Internal Medicine

## 2012-11-23 VITALS — BP 157/88 | HR 83 | Temp 98.2°F | Ht 62.0 in | Wt 166.6 lb

## 2012-11-23 DIAGNOSIS — Z658 Other specified problems related to psychosocial circumstances: Secondary | ICD-10-CM

## 2012-11-23 DIAGNOSIS — H538 Other visual disturbances: Secondary | ICD-10-CM | POA: Insufficient documentation

## 2012-11-23 DIAGNOSIS — M797 Fibromyalgia: Secondary | ICD-10-CM

## 2012-11-23 DIAGNOSIS — IMO0001 Reserved for inherently not codable concepts without codable children: Secondary | ICD-10-CM

## 2012-11-23 DIAGNOSIS — F329 Major depressive disorder, single episode, unspecified: Secondary | ICD-10-CM

## 2012-11-23 DIAGNOSIS — A630 Anogenital (venereal) warts: Secondary | ICD-10-CM

## 2012-11-23 DIAGNOSIS — M792 Neuralgia and neuritis, unspecified: Secondary | ICD-10-CM

## 2012-11-23 DIAGNOSIS — E1142 Type 2 diabetes mellitus with diabetic polyneuropathy: Secondary | ICD-10-CM

## 2012-11-23 DIAGNOSIS — Z0000 Encounter for general adult medical examination without abnormal findings: Secondary | ICD-10-CM

## 2012-11-23 DIAGNOSIS — Z9114 Patient's other noncompliance with medication regimen: Secondary | ICD-10-CM

## 2012-11-23 DIAGNOSIS — Z79899 Other long term (current) drug therapy: Secondary | ICD-10-CM

## 2012-11-23 DIAGNOSIS — E039 Hypothyroidism, unspecified: Secondary | ICD-10-CM

## 2012-11-23 DIAGNOSIS — E119 Type 2 diabetes mellitus without complications: Secondary | ICD-10-CM

## 2012-11-23 DIAGNOSIS — R42 Dizziness and giddiness: Secondary | ICD-10-CM

## 2012-11-23 DIAGNOSIS — E1140 Type 2 diabetes mellitus with diabetic neuropathy, unspecified: Secondary | ICD-10-CM

## 2012-11-23 DIAGNOSIS — G4700 Insomnia, unspecified: Secondary | ICD-10-CM

## 2012-11-23 LAB — POCT GLYCOSYLATED HEMOGLOBIN (HGB A1C): Hemoglobin A1C: 11.8

## 2012-11-23 LAB — GLUCOSE, CAPILLARY: Glucose-Capillary: 427 mg/dL — ABNORMAL HIGH (ref 70–99)

## 2012-11-23 MED ORDER — METFORMIN HCL 1000 MG PO TABS: 1000.0000 mg | ORAL_TABLET | Freq: Two times a day (BID) | ORAL | Status: DC

## 2012-11-23 MED ORDER — ZOLPIDEM TARTRATE 5 MG PO TABS: 5.0000 mg | ORAL_TABLET | Freq: Every evening | ORAL | Status: DC | PRN

## 2012-11-23 MED ORDER — INSULIN ASPART 100 UNIT/ML ~~LOC~~ SOLN: 3.0000 [IU] | Freq: Three times a day (TID) | SUBCUTANEOUS | Status: DC

## 2012-11-23 MED ORDER — LEVOTHYROXINE SODIUM 75 MCG PO TABS: 75.0000 ug | ORAL_TABLET | Freq: Every day | ORAL | Status: DC

## 2012-11-23 MED ORDER — INSULIN GLARGINE 100 UNIT/ML ~~LOC~~ SOLN: SUBCUTANEOUS | Status: DC

## 2012-11-23 MED ORDER — GABAPENTIN 600 MG PO TABS: 600.0000 mg | ORAL_TABLET | Freq: Three times a day (TID) | ORAL | Status: DC

## 2012-11-23 MED ORDER — MECLIZINE HCL 25 MG PO TABS: 25.0000 mg | ORAL_TABLET | Freq: Four times a day (QID) | ORAL | Status: DC | PRN

## 2012-11-23 NOTE — Patient Instructions (Addendum)
General Instructions: There are several issues which we need to address: 1. It is important to keep your appointment with Dr. Carlean Purl for your repeat colonoscopy next week. 2. Dr. Pollie Friar office will contact you for the full hearing test.  If you do not get a call in the next 2 weeks, contact his office. 3. You need to take your medicines as prescribed as best you can, even though this may require you to make several trips to the [facility] or require you to spend a few dollars on the medication from [place] because your uncontrolled diabetes and thyroid disease may be contributing if not causing your symptoms of depressing, dizziness, and muscle aches. 4.  Once you are taking your medications and your thyroid and diabetes levels are at least close to normal, we may be able to get a specialist to further evaluate you for fibromyalgia.  They will not do this until your other medical conditions are addressed first. 5. It would benefit your to see a psychologist or counselor for your mood.  I have given you a handout for [facility] which offers free and confidential services.  6. We will get your mammogram and eye exam scheduled. 7. See me back after you have been taking the Synthroid for several weeks    Treatment Goals:  Goals (1 Years of Data) as of 11/23/12         As of Today 11/09/12 10/12/12 10/12/12 10/12/12     Blood Pressure    . Blood Pressure < 140/90  157/88 132/76 134/69 122/62 109/47     Result Component    . HEMOGLOBIN A1C < 7.0          . LDL CALC < 100            Progress Toward Treatment Goals:  Treatment Goal 11/23/2012  Hemoglobin A1C unchanged    Self Care Goals & Plans:  Self Care Goal 11/23/2012  Manage my medications take my medicines as prescribed; bring my medications to every visit; refill my medications on time  Monitor my health keep track of my blood glucose  Eat healthy foods drink diet soda or water instead of juice or soda; eat more  vegetables; eat foods that are low in salt    Home Blood Glucose Monitoring 11/23/2012  Check my blood sugar 3 times a day  When to check my blood sugar before meals     Care Management & Community Referrals:  Referral 11/23/2012  Referrals made for care management support social worker  Referrals made to community resources -

## 2012-11-23 NOTE — Assessment & Plan Note (Addendum)
Lab Results  Component Value Date   HGBA1C 11.8 07/21/2012   HGBA1C 6.3 03/04/2012   HGBA1C 8.8 08/29/2011     Assessment:  Diabetes control: poor control (HgbA1C >9%)  Progress toward A1C goal:  unchanged  Comments: noncompliant with medications  Plan:  Medications:  continue current medications  Home glucose monitoring:   Frequency: 3 times a day   Timing: before meals  Instruction/counseling given: reminded to bring medications to each visit  Educational resources provided: brochure  Self management tools provided:    Other plans: will give sample of Lantus pen and needles today

## 2012-11-23 NOTE — Progress Notes (Signed)
Subjective:    Patient ID: Kayla [name], female    DOB: [birth year], 52 y.o.   MRN: [id number]  HPI  Pt presents for referral for neurologist for her fibromyaglia on recommendation per her [facility].  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 She reports that she does participate in the [facility] drug assistance program at the health department but has not been going to get her medications regularly. In addition she reports that she hasn't been able to get a few dollars to fill her prescription at [facility] drug under that program specifically for levothyroxin or meclizine. She reports that she has not taken levothyroxin in February. She has been seen by GI Dr. Carlean Purl for anal warts and had colonoscopy that was inconclusive secondary to poor bowel prep. She also has been evaluated by ENT Dr. Lucia Gaskins and supposed to followup after for audiology testing.   Review of Systems  Constitutional: Positive for chills and fatigue. Negative for fever.  HENT: Positive for hearing loss and tinnitus.        Right ear fullness  Eyes: Positive for visual disturbance.       Eyes stay blurry  Respiratory: Positive for shortness of breath. Negative for cough.   Cardiovascular: Positive for leg swelling. Negative for chest pain.  Gastrointestinal: Positive for constipation. Negative for rectal pain.  Genitourinary: Positive for genital sores. Negative for hematuria.  Musculoskeletal: Positive for myalgias.  Neurological: Positive for dizziness and headaches.       Dizziness improved since using "techniques"; more headaches  Psychiatric/Behavioral: Positive for suicidal ideas, sleep disturbance, dysphoric mood and decreased concentration.       No plan for SI, difficulty getting and staying asleep       Objective:   Physical Exam  Constitutional: She is oriented to person, place, and time. She appears well-developed and well-nourished. No distress.  HENT:  Head: Normocephalic and atraumatic.  Right Ear: External ear normal.  Left Ear: External ear normal.  Eyes: Conjunctivae and EOM are normal. Pupils are equal, round, and reactive to light.  Neck: Normal range of motion. Neck supple. No thyromegaly present.  Cardiovascular: Normal rate, regular rhythm, normal heart sounds and intact distal pulses.   Pulmonary/Chest: Effort normal and breath sounds normal.  Abdominal: Soft. Bowel sounds are normal.  Musculoskeletal: Normal range of motion. She exhibits no edema and no tenderness.  Neurological: She is alert and oriented to person, place, and time.  Skin: Skin is warm and dry.  Psychiatric: Her speech is normal and behavior is normal. Her mood appears not anxious. Her affect is not angry. She exhibits a depressed mood.          Assessment & Plan:   #1 diabetes mellitus type 2, uncontrolled: Hemoglobin A1c 11.8 today which is unchanged from October 2013, patient was given sample of Lantus pen again and injected 40 units during exam.  #2 hypothyroidism: not taking levothyroxine, will refill today, last level elevated  #3 anal warts: colonoscopy rescheduled for early March per Dr. Finis Bud better prep  #4 dizziness and tinnitus: improved, pt didn't fill meclizine prescription, evaluated by Dr. Lucia Gaskins of ENT, due for full audiologic assessment  #5 f/u in ~3-4 weeks after taking levothyroxine and will re check TSH; discussed importance of compliance and need for Thyroid and diabetes to have better control before evaluation by specialists  There are several issues which we need to address: 1. It is important to keep your appointment with Dr. Carlean Purl for your repeat colonoscopy next week. 2. Dr. Pollie Friar office will contact you for the full hearing test.  If you do not get a call in the next 2  weeks, contact his office. 3. You need to take your medicines as prescribed as best you can, even though this may require you to make several trips to the Health Department or require you to spend a few dollars on the medication from [place] because your uncontrolled diabetes and thyroid disease may be contributing if not causing your symptoms of depressing, dizziness, and muscle aches. 4.  Once you are taking your medications and your thyroid and diabetes levels are at least close to normal, we may be able to get a specialist to further evaluate you for fibromyalgia.  They will not do this until your other medical conditions are addressed first. 5. It would benefit your to see a psychologist or counselor for your mood.  I have given you a handout for [facility] which offers free and confidential services.  6. See me back in another 3 months.

## 2012-11-24 ENCOUNTER — Ambulatory Visit (HOSPITAL_COMMUNITY)
Admission: RE | Admit: 2012-11-24 | Discharge: 2012-11-24 | Disposition: A | Discharge status: Home / Self Care | Payer: Self-pay | Source: Ambulatory Visit | Attending: Internal Medicine | Admitting: Internal Medicine

## 2012-11-24 DIAGNOSIS — Z1231 Encounter for screening mammogram for malignant neoplasm of breast: Secondary | ICD-10-CM | POA: Insufficient documentation

## 2012-11-24 DIAGNOSIS — Z0000 Encounter for general adult medical examination without abnormal findings: Secondary | ICD-10-CM

## 2012-12-01 ENCOUNTER — Telehealth: Payer: Self-pay | Admitting: Internal Medicine

## 2012-12-01 NOTE — Telephone Encounter (Signed)
No charge. 

## 2012-12-03 ENCOUNTER — Encounter: Payer: Self-pay | Admitting: Internal Medicine

## 2012-12-14 ENCOUNTER — Other Ambulatory Visit: Payer: Self-pay

## 2012-12-21 ENCOUNTER — Ambulatory Visit (INDEPENDENT_AMBULATORY_CARE_PROVIDER_SITE_OTHER): Payer: No Typology Code available for payment source | Admitting: Dietician

## 2012-12-21 DIAGNOSIS — E1140 Type 2 diabetes mellitus with diabetic neuropathy, unspecified: Secondary | ICD-10-CM

## 2012-12-21 DIAGNOSIS — E1149 Type 2 diabetes mellitus with other diabetic neurological complication: Secondary | ICD-10-CM

## 2012-12-21 DIAGNOSIS — E1142 Type 2 diabetes mellitus with diabetic polyneuropathy: Secondary | ICD-10-CM

## 2012-12-21 DIAGNOSIS — E1165 Type 2 diabetes mellitus with hyperglycemia: Secondary | ICD-10-CM

## 2012-12-21 NOTE — Progress Notes (Signed)
Patient was here for retinal photo. She also reported that she is having a diffiluct time affording ambien, gabapentin and possibly synthroid.

## 2012-12-28 ENCOUNTER — Ambulatory Visit (INDEPENDENT_AMBULATORY_CARE_PROVIDER_SITE_OTHER): Payer: No Typology Code available for payment source | Admitting: Internal Medicine

## 2012-12-28 ENCOUNTER — Encounter: Payer: Self-pay | Admitting: Internal Medicine

## 2012-12-28 VITALS — BP 142/83 | HR 86 | Temp 97.8°F | Ht 62.0 in | Wt 177.7 lb

## 2012-12-28 DIAGNOSIS — E1140 Type 2 diabetes mellitus with diabetic neuropathy, unspecified: Secondary | ICD-10-CM

## 2012-12-28 DIAGNOSIS — E1142 Type 2 diabetes mellitus with diabetic polyneuropathy: Secondary | ICD-10-CM

## 2012-12-28 DIAGNOSIS — Z9119 Patient's noncompliance with other medical treatment and regimen: Secondary | ICD-10-CM

## 2012-12-28 DIAGNOSIS — E1149 Type 2 diabetes mellitus with other diabetic neurological complication: Secondary | ICD-10-CM

## 2012-12-28 DIAGNOSIS — F3289 Other specified depressive episodes: Secondary | ICD-10-CM

## 2012-12-28 DIAGNOSIS — F329 Major depressive disorder, single episode, unspecified: Secondary | ICD-10-CM

## 2012-12-28 DIAGNOSIS — R300 Dysuria: Secondary | ICD-10-CM

## 2012-12-28 DIAGNOSIS — E039 Hypothyroidism, unspecified: Secondary | ICD-10-CM

## 2012-12-28 DIAGNOSIS — H538 Other visual disturbances: Secondary | ICD-10-CM

## 2012-12-28 DIAGNOSIS — Z9114 Patient's other noncompliance with medication regimen: Secondary | ICD-10-CM

## 2012-12-28 DIAGNOSIS — Z91199 Patient's noncompliance with other medical treatment and regimen due to unspecified reason: Secondary | ICD-10-CM

## 2012-12-28 LAB — GLUCOSE, CAPILLARY: Glucose-Capillary: 80 mg/dL (ref 70–99)

## 2012-12-28 MED ORDER — INSULIN GLARGINE 100 UNIT/ML ~~LOC~~ SOLN: SUBCUTANEOUS | Status: DC

## 2012-12-28 NOTE — Assessment & Plan Note (Signed)
Pt hasnt been taking Synthroid.  States that she will retrieve from [facility] and is willing to return in 1 month for TSH recheck. Of note, pt specifically request for "30 more days" before we check her TSH

## 2012-12-28 NOTE — Progress Notes (Signed)
  Subjective:    Patient ID: Kayla [name], female    DOB: [birth year], 52 y.o.   MRN: [id number]  HPI   Pt with DM Type 2 poorly controlled with neuropathy secondary to poor compliance with medications, hypertension, hypothyroidism and depression for 1 month f/u.  She reports that she has been taking Lantus but has not been taking the Synthroid because she had not obtained it from the [facility].  States that she would like 30 more days before TSH recheck.  C/o sensation of "something in (her) left foot".  Of note she is also due for a repeat retinal imaging as there were technical difficulties on the last attempt.   Review of Systems  Constitutional: Positive for fatigue.  Eyes: Positive for visual disturbance.       Blurry  Respiratory: Negative for chest tightness and shortness of breath.   Cardiovascular: Negative for chest pain.  Gastrointestinal: Negative for diarrhea and constipation.  Genitourinary: Positive for dysuria.       Reports "strong smell"  Skin: Negative for rash.  Neurological: Positive for numbness.       Right foot       Objective:   Physical Exam  Constitutional: She is oriented to person, place, and time. She appears well-developed and well-nourished. No distress.  HENT:  Head: Normocephalic and atraumatic.  Eyes: Conjunctivae and EOM are normal. Pupils are equal, round, and reactive to light.  Neck: Normal range of motion. Neck supple.  Cardiovascular: Normal rate, regular rhythm and normal heart sounds.   Pulmonary/Chest: Effort normal and breath sounds normal.  Abdominal: Soft. Bowel sounds are normal. She exhibits no distension. There is no tenderness.  Musculoskeletal: Normal range of motion. She exhibits no edema and no tenderness.  Neurological: She is alert and oriented to person, place, and time.  Skin: Skin is warm and dry.  Psychiatric: She has a normal mood and affect.          Assessment & Plan:  1. DM Type 2 with neuropathy:  poorly controlled, last HgbA1c 11.8, cbg today 80 -will conduct DM foot exam today, explained to pt that poorly controlled diabetes likely worsening her neuropathy -will give another Lantus pen sample with pens -check Retinal Exam today  2. Hypothyroidism: pt states she will begin Synthroid and return for TSH in 1 month, explained to pt that uncontrolled hypothyroidism also can increase her neuropathic symptoms  3. Anal warts: pt to f/u with Dr. Carlean Purl for repeat screening colonoscopy due to prior poor bowel prep; scheduled for 12/03/3012  4. Preventive Care: mammogram done 11/24/2012; colonoscopy as above  5. Dysuria: dipstick positive for protein, trace blood, small leuks -send for U/A -address need for ARB at next f/u (reported ACEi intolerance), previously on olmesartan in 2012

## 2012-12-28 NOTE — Assessment & Plan Note (Signed)
Lab Results  Component Value Date   HGBA1C 11.8 11/23/2012   HGBA1C 11.8 07/21/2012   HGBA1C 6.3 03/04/2012     Assessment:  Diabetes control: poor control (HgbA1C >9%)  Progress toward A1C goal:  deteriorated  Comments: [facility],  cbg within normal limits today  Plan:  Medications:  continue current medications  Home glucose monitoring:   Frequency:     Timing:    Instruction/counseling given: reminded to get eye exam and discussed foot care  Educational resources provided: brochure  Self management tools provided:    Other plans: will give another Lantus sample pen and needles today, repeat Retinal Screen today, repeat diabetic foot exam given c/o foot discomfort

## 2012-12-28 NOTE — Patient Instructions (Signed)
General Instructions: It is important to reschedule your colonoscopy. Continue to take the Lantus as prescribed. We gave you another sample today. We will get a repeat Retinal Scan. It is important to take your thyroid medicine.  If this and your diabetes is not controlled, you will continue to have worsening neuropathy (tingling, pain, burning, discomfort in your feet). Return in 1 month to check your TSH.    Treatment Goals:  Goals (1 Years of Data) as of 12/28/12         As of Today 11/23/12 11/09/12 10/12/12 10/12/12     Blood Pressure    . Blood Pressure < 140/90  142/83 157/88 132/76 134/69 122/62     Result Component    . HEMOGLOBIN A1C < 7.0   11.8       . LDL CALC < 100            Progress Toward Treatment Goals:  Treatment Goal 12/28/2012  Hemoglobin A1C deteriorated    Self Care Goals & Plans:  Self Care Goal 12/28/2012  Manage my medications -  Monitor my health -  Eat healthy foods drink diet soda or water instead of juice or soda; eat more vegetables; eat foods that are low in salt; eat baked foods instead of fried foods    Home Blood Glucose Monitoring 11/23/2012  Check my blood sugar 3 times a day  When to check my blood sugar before meals     Care Management & Community Referrals:  Referral 12/28/2012  Referrals made for care management support diabetes educator  Referrals made to community resources -

## 2012-12-29 LAB — URINALYSIS, ROUTINE W REFLEX MICROSCOPIC
Bilirubin Urine: NEGATIVE
Glucose, UA: NEGATIVE mg/dL
Hgb urine dipstick: NEGATIVE
Ketones, ur: NEGATIVE mg/dL
Nitrite: POSITIVE — AB
Protein, ur: 100 mg/dL — AB
Specific Gravity, Urine: 1.013 (ref 1.005–1.030)
Urobilinogen, UA: 1 mg/dL (ref 0.0–1.0)
pH: 6 (ref 5.0–8.0)

## 2012-12-29 LAB — URINALYSIS, MICROSCOPIC ONLY
Casts: NONE SEEN
Crystals: NONE SEEN
Squamous Epithelial / LPF: NONE SEEN
WBC, UA: >50 WBC/hpf — AB (ref <3)

## 2012-12-31 LAB — URINE CULTURE: Colony Count: >=100000

## 2013-01-01 ENCOUNTER — Encounter: Payer: Self-pay | Admitting: *Deleted

## 2013-01-02 ENCOUNTER — Encounter: Payer: Self-pay | Admitting: Internal Medicine

## 2013-01-02 DIAGNOSIS — R269 Unspecified abnormalities of gait and mobility: Secondary | ICD-10-CM | POA: Insufficient documentation

## 2013-01-07 ENCOUNTER — Ambulatory Visit (AMBULATORY_SURGERY_CENTER): Payer: Self-pay | Admitting: Internal Medicine

## 2013-01-07 ENCOUNTER — Other Ambulatory Visit: Payer: Self-pay | Admitting: Internal Medicine

## 2013-01-07 ENCOUNTER — Encounter: Payer: Self-pay | Admitting: Internal Medicine

## 2013-01-07 VITALS — BP 154/89 | HR 53 | Temp 97.9°F | Resp 18 | Ht 62.0 in | Wt 167.0 lb

## 2013-01-07 DIAGNOSIS — Z1211 Encounter for screening for malignant neoplasm of colon: Secondary | ICD-10-CM

## 2013-01-07 LAB — GLUCOSE, CAPILLARY
Glucose-Capillary: 85 mg/dL (ref 70–99)
Glucose-Capillary: 207 mg/dL — ABNORMAL HIGH (ref 70–99)

## 2013-01-07 MED ORDER — SODIUM CHLORIDE 0.9 % IV SOLN: 500.0000 mL | INTRAVENOUS | Status: DC

## 2013-01-07 MED ORDER — DEXTROSE 5 % IV SOLN: INTRAVENOUS | Status: DC

## 2013-01-07 NOTE — Op Note (Signed)
[place]  [address] [place], [id number]   COLONOSCOPY PROCEDURE REPORT  PATIENT: Kayla [name], Kayla [name]  MR#: [id number] BIRTHDATE: 19-Oct-1960 , 51  yrs. old GENDER: Female ENDOSCOPIST: Gatha Mayer, MD, [facility] REFERRED BY:  Dorian Heckle, MD PROCEDURE DATE:  01/07/2013 PROCEDURE:   Colonoscopy, screening ASA CLASS:   Class III INDICATIONS:average risk screening. MEDICATIONS: propofol (Diprivan) 300mg  IV, MAC sedation, administered by CRNA, and These medications were titrated to patient response per physician's verbal order  DESCRIPTION OF PROCEDURE:   After the risks benefits and alternatives of the procedure were thoroughly explained, informed consent was obtained.  A digital rectal exam revealed anal condyloma and A digital rectal exam revealed no rectal mass.   The LB [id number]  endoscope was introduced through the anus and advanced to the cecum, which was identified by both the appendix and ileocecal valve. No adverse events experienced.   The quality of the prep was adequate, using MoviPrep  The instrument was then slowly withdrawn as the colon was fully examined.      COLON FINDINGS: A normal appearing cecum, ileocecal valve, and appendiceal orifice were identified.  The ascending, hepatic flexure, transverse, splenic flexure, descending, sigmoid colon and rectum appeared unremarkable.  No polyps or cancers were seen. Retroflexed views revealed no rectal abnormalities but anal warts seen. The time to cecum=3 minutes 13 seconds.  Withdrawal time=16 minutes 37 seconds.  The scope was withdrawn and the procedure completed. COMPLICATIONS: There were no complications.  ENDOSCOPIC IMPRESSION: Normal colon - adequate prep with cleansing  RECOMMENDATIONS: Repeat Colonscopy in 10 years.   eSigned:  Gatha Mayer, MD, [facility] 01/07/2013 2:22 PM   cc: The Patient  and Dorian Heckle, MD

## 2013-01-07 NOTE — Patient Instructions (Addendum)
No polyps were seen today.  Next routine colonoscopy in about 10 years - 2024.  Thank you for choosing me and [facility].  Gatha Mayer, MD, FACG   YOU HAD AN ENDOSCOPIC PROCEDURE TODAY AT [facility]: Refer to the procedure report that was given to you for any specific questions about what was found during the examination.  If the procedure report does not answer your questions, please call your gastroenterologist to clarify.  If you requested that your care partner not be given the details of your procedure findings, then the procedure report has been included in a sealed envelope for you to review at your convenience later.  YOU SHOULD EXPECT: Some feelings of bloating in the abdomen. Passage of more gas than usual.  Walking can help get rid of the air that was put into your GI tract during the procedure and reduce the bloating. If you had a lower endoscopy (such as a colonoscopy or flexible sigmoidoscopy) you may notice spotting of blood in your stool or on the toilet paper. If you underwent a bowel prep for your procedure, then you may not have a normal bowel movement for a few days.  DIET: Your first meal following the procedure should be a light meal and then it is ok to progress to your normal diet.  A half-sandwich or bowl of soup is an example of a good first meal.  Heavy or fried foods are harder to digest and may make you feel nauseous or bloated.  Likewise meals heavy in dairy and vegetables can cause extra gas to form and this can also increase the bloating.  Drink plenty of fluids but you should avoid alcoholic beverages for 24 hours.  ACTIVITY: Your care partner should take you home directly after the procedure.  You should plan to take it easy, moving slowly for the rest of the day.  You can resume normal activity the day after the procedure however you should NOT DRIVE or use heavy machinery for 24 hours (because of the sedation medicines used during  the test).    SYMPTOMS TO REPORT IMMEDIATELY: A gastroenterologist can be reached at any hour.  During normal business hours, 8:30 AM to 5:00 PM Monday through Friday, call [phone].  After hours and on weekends, please call the GI answering service at [phone] who will take a message and have the physician on call contact you.   Following lower endoscopy (colonoscopy or flexible sigmoidoscopy):  Excessive amounts of blood in the stool  Significant tenderness or worsening of abdominal pains  Swelling of the abdomen that is new, acute  Fever of 100F or higher  FOLLOW UP: If any biopsies were taken you will be contacted by phone or by letter within the next 1-3 weeks.  Call your gastroenterologist if you have not heard about the biopsies in 3 weeks.  Our staff will call the home number listed on your records the next business day following your procedure to check on you and address any questions or concerns that you may have at that time regarding the information given to you following your procedure. This is a courtesy call and so if there is no answer at the home number and we have not heard from you through the emergency physician on call, we will assume that you have returned to your regular daily activities without incident.  SIGNATURES/CONFIDENTIALITY: You and/or your care partner have signed paperwork which will be entered into your electronic medical  record.  These signatures attest to the fact that that the information above on your After Visit Summary has been reviewed and is understood.  Full responsibility of the confidentiality of this discharge information lies with you and/or your care-partner. 

## 2013-01-07 NOTE — Progress Notes (Signed)
Patient did not have preoperative order for IV antibiotic SSI prophylaxis. (G8918)  Patient did not experience any of the following events: a burn prior to discharge; a fall within the facility; wrong site/side/patient/procedure/implant event; or a hospital transfer or hospital admission upon discharge from the facility. (G8907)  

## 2013-01-08 ENCOUNTER — Telehealth: Payer: Self-pay

## 2013-01-08 NOTE — Telephone Encounter (Signed)
  Follow up Call-  Call back number 01/07/2013 10/12/2012 10/12/2012  Post procedure Call Back phone  # [phone]- please uses this number [phone]  Permission to leave phone message Yes Yes Yes     Patient questions:  Do you have a fever, pain , or abdominal swelling? no Pain Score  0 *  Have you tolerated food without any problems? yes  Have you been able to return to your normal activities? yes  Do you have any questions about your discharge instructions: Diet   no Medications  no Follow up visit  no  Do you have questions or concerns about your Care? no  Actions: * If pain score is 4 or above: No action needed, pain <4.

## 2013-01-08 NOTE — Addendum Note (Signed)
Addended by: Hulan Fray on: 01/08/2013 03:24 PM   Modules accepted: Orders

## 2013-01-18 ENCOUNTER — Telehealth: Payer: Self-pay | Admitting: *Deleted

## 2013-01-18 NOTE — Telephone Encounter (Signed)
Call from pt message that she had seen Vocational Rehabilitation and needed to get a referral to Neurology and a referral to ENT.  Called ENT pt did keep appointment in January of 2014.  Pt was referred by Dr. Pollie Friar office to [facility].  Call to Audiology no referral has been done or pt did not go for the appointment.  Call to pt informed her that she will need to come in to be renewded for the [facility] and that referrals can be made after this.  Dr Michail Sermon to be informed of the need for the referrals. Sander Nephew, RN

## 2013-01-25 ENCOUNTER — Encounter: Payer: Self-pay | Admitting: *Deleted

## 2013-01-25 ENCOUNTER — Encounter: Payer: Self-pay | Admitting: Internal Medicine

## 2013-01-25 NOTE — Progress Notes (Unsigned)
Called to Kayla [name]. Office, pt states since thurs she has pain and weakness mostly on R side, she states she feels horrible. Dr Cathren Laine eval for possible ED visit, he states she told him she couldn't pop her R shoulder in place like she usually does. Per dr Cathren Laine pt is scheduled for 1415 tomorrow dr Eula Fried, she is cautioned to be on time and even early for the appt

## 2013-01-25 NOTE — Progress Notes (Signed)
I talked to the patient while she was being evaluated for acute right sided arm weakness. She told me that she has had pain and weakness in her right arm and shoulder since last 3 weeks and since Thursday(5days prior to eval) has been having difficulty putting it back  In place which she was able to pop back in until Thursday. No open appointments for today. Patient given an appointment for tomorrow. Advised to visit urgent care center if pain is not relieved with OTC pain medications.   Adelfa Lozito

## 2013-01-26 ENCOUNTER — Encounter: Payer: Self-pay | Admitting: Internal Medicine

## 2013-01-26 ENCOUNTER — Ambulatory Visit (INDEPENDENT_AMBULATORY_CARE_PROVIDER_SITE_OTHER): Payer: No Typology Code available for payment source | Admitting: Internal Medicine

## 2013-01-26 VITALS — BP 135/72 | HR 75 | Temp 99.0°F | Ht 62.0 in | Wt 175.9 lb

## 2013-01-26 DIAGNOSIS — E1149 Type 2 diabetes mellitus with other diabetic neurological complication: Secondary | ICD-10-CM

## 2013-01-26 DIAGNOSIS — E1142 Type 2 diabetes mellitus with diabetic polyneuropathy: Secondary | ICD-10-CM

## 2013-01-26 DIAGNOSIS — G4700 Insomnia, unspecified: Secondary | ICD-10-CM

## 2013-01-26 DIAGNOSIS — M25519 Pain in unspecified shoulder: Secondary | ICD-10-CM

## 2013-01-26 DIAGNOSIS — E1140 Type 2 diabetes mellitus with diabetic neuropathy, unspecified: Secondary | ICD-10-CM

## 2013-01-26 DIAGNOSIS — M25511 Pain in right shoulder: Secondary | ICD-10-CM

## 2013-01-26 LAB — GLUCOSE, CAPILLARY: Glucose-Capillary: 142 mg/dL — ABNORMAL HIGH (ref 70–99)

## 2013-01-26 MED ORDER — TOPIRAMATE 25 MG PO TABS: 25.0000 mg | ORAL_TABLET | Freq: Every day | ORAL | Status: DC

## 2013-01-26 MED ORDER — TOPIRAMATE 25 MG PO TABS: 25.0000 mg | ORAL_TABLET | Freq: Two times a day (BID) | ORAL | Status: DC

## 2013-01-26 NOTE — Progress Notes (Signed)
Subjective:   Patient ID: Kayla [name] female   DOB: 03-Mar-1961 52 y.o.   MRN: [id number]  HPI: Kayla [name] is a 52 y.o. African American female with uncontrolled DM2 with severe neuropathy and hypothyroidism presents to clinic today for acute visit with complaints of severe diffuse neuropathic pain.  Her pain while chronic, is currently 10+/10 to the point that she can barely walk or even wear clothes.  The pain is constant, burning in nature with numbness and tingling and sharp.  Felt mainly in b/l feet, knees, arms, and shoulders.  She also has chronic right shoulder pain and limited mobility of the arm due to pain.  She claims she has been taking her gabapentin for up to 6 times a day recently with limited relief and has also tried amitriptyline and aleve in the past with no success.     Of note, Kayla [name] also reports being unemployed at this time with limited resources but she hopes that somehow this pain improves.  She denies any suicidal or homicidal ideation. She also claims she has chronic depression since she was a child that is stable and unchanged.  She says no medication has helped in the past and she has tried multiple antidepressants including TCAs.   She denies any recent trauma, fever, chills, N/V/D, abdominal pain, shortness of breath, or any urinary complaints at this time.   Past Medical History  Diagnosis Date  . ANA POSITIVE 02/09/2010  . Type 2 diabetes mellitus   . Hyperlipidemia   . Dizziness     Multiple falls at home  . Diabetic neuropathy     Of hands and feet  . Sickle cell trait   . Fibromyalgia   . Hypothyroidism   . Deafness in right ear 2012    in the process of treatment  . Anal warts 11/09/2012   Current Outpatient Prescriptions  Medication Sig Dispense Refill  . ACCU-CHEK FASTCLIX LANCETS MISC 1 each by Does not apply route 4 (four) times daily -  before meals and at bedtime.  100 each  11  . Blood Glucose Monitoring Suppl (ACCU-CHEK AVIVA PLUS)  W/DEVICE KIT 1 kit by Does not apply route as directed.  1 kit  0  . Blood Glucose Monitoring Suppl (ACCU-CHEK NANO SMARTVIEW) W/DEVICE KIT 1 each by Does not apply route 4 (four) times daily -  before meals and at bedtime.  1 kit  0  . gabapentin (NEURONTIN) 600 MG tablet Take 1 tablet (600 mg total) by mouth 3 (three) times daily.  90 tablet  11  . insulin aspart (NOVOLOG FLEXPEN) 100 UNIT/ML injection Inject 3-5 Units into the skin 3 (three) times daily before meals.  1 vial  6  . insulin glargine (LANTUS SOLOSTAR) 100 UNIT/ML injection Inject 40 units at bedtime  15 mL  10  . levothyroxine (SYNTHROID) 75 MCG tablet Take 1 tablet (75 mcg total) by mouth daily.  30 tablet  11  . metFORMIN (GLUCOPHAGE) 1000 MG tablet Take 1 tablet (1,000 mg total) by mouth 2 (two) times daily with a meal.  60 tablet  6  . zolpidem (AMBIEN) 5 MG tablet Take 10 mg by mouth at bedtime as needed.      . meclizine (ANTIVERT) 25 MG tablet Take 1 tablet (25 mg total) by mouth every 6 (six) hours as needed for dizziness.  20 tablet  0  . topiramate (TOPAMAX) 25 MG tablet Take 1 tablet (25 mg total) by mouth daily.  30 tablet  1   No current facility-administered medications for this visit.   Family History  Problem Relation Age of Onset  . Emphysema Mother   . Cancer Mother     unknown  . Hypertension Mother   . Cancer Father     pancreatic   . Diabetes Father   . Hypertension Father   . Down syndrome Brother   . Cancer Maternal Aunt     cancer  . Cancer Other     breast cancer aunt  . Stroke Sister   . Diabetes Sister   . Colon cancer Neg Hx   . Stomach cancer Neg Hx    History   Social History  . Marital Status: Single    Spouse Name: N/A    Number of Children: N/A  . Years of Education: N/A   Social History Main Topics  . Smoking status: Former Smoker -- 5 years    Types: Cigarettes    Quit date: 09/30/1990  . Smokeless tobacco: Never Used  . Alcohol Use: No     Comment: Former use, denies  heavy use/dependence/abuse  . Drug Use: No  . Sexually Active: Not Currently   Other Topics Concern  . None   Social History Narrative   Lives in [place], alone. Looking for a job. Has a son in town who checks in on her.    Review of Systems: Constitutional: Denies fever, chills, diaphoresis, appetite change and fatigue.  HEENT: Denies photophobia, eye pain, redness, hearing loss, ear pain, congestion, sore throat, rhinorrhea, sneezing, mouth sores, trouble swallowing, neck pain, neck stiffness and tinnitus.   Respiratory: Denies SOB, DOE, cough, chest tightness,  and wheezing.   Cardiovascular: Denies chest pain, palpitations and leg swelling.  Gastrointestinal: Denies nausea, vomiting, abdominal pain, diarrhea, constipation, blood in stool and abdominal distention.  Genitourinary: Denies dysuria, urgency, frequency, hematuria, flank pain and difficulty urinating.  Endocrine: Denies: hot or cold intolerance, sweats, changes in hair or nails, polyuria, polydipsia. Musculoskeletal: Diffuse neuropathic pain.  Denies myalgias, joint swelling, arthralgias and gait problem.  Skin: Denies pallor, rash and wound.  Neurological: Denies dizziness, seizures, syncope, weakness, light-headedness, numbness and headaches.  Hematological: Denies adenopathy. Easy bruising, personal or family bleeding history  Psychiatric/Behavioral: Chronic depression.  Denies suicidal ideation, mood changes, confusion, nervousness, sleep disturbance and agitation  Objective:  Physical Exam: Filed Vitals:   01/26/13 1409  BP: 135/72  Pulse: 75  Temp: 99 F (37.2 C)  TempSrc: Oral  Height: 5\' 2"  (1.575 m)  Weight: 175 lb 14.4 oz (79.788 kg)  SpO2: 99%   General: AAOX3, acute distress due to pain Head: Normocephalic and atraumatic.  Eyes: Vision grossly intact, PERRLA, EOMI  Mouth: Pharynx pink and moist, no erythema, and no exudates.  Neck: Supple, full ROM.  Lungs: Normal respiratory effort, CTA B/L, no  accessory muscle use, no crackles, and no wheezes. Heart: Normal rate, regular rhythm, no murmur, no gallop, and no rub.  Abdomen: Soft, non-tender, normal bowel sounds, non-distended, non-obese Msk: No joint swelling, no joint warmth, and no redness over joints. Extremely tender to palpation on all extremities even with light palpation  Pulses: 2+ DP/PT pulses bilaterally Extremities: No cyanosis, clubbing, edema Neurologic: Alert & oriented X3, cranial nerves II-XII intact, strength difficult to assess due to poor effort and complaints of pain, sensation intact and hypersensitive to light touch, and gait normal.  Skin: Turgor normal and no rashes.  Psych: Oriented X3, memory intact for recent and remote, normally interactive, good eye contact.  Assessment & Plan:  Discussed with Dr. Daryll Drown  Severe diabetic neuropathy: try Topamax

## 2013-01-26 NOTE — Patient Instructions (Addendum)
General Instructions: Start Topamax 25mg  daily for two weeks and call clinic and let us know if any improvement, no change or worsening.  We will slowly increase the dose of this medication if it is helping.    DO NOT TAKE MORE THAN 1800mg  of Neurontin per day!  If you start noticing worsening of your symptoms or of your depression, stop the medicine and call clinic right away [id number]  We have referred you to pain clinic and sports medicine today, please follow up with them  Please continue to work on your diabetes control, check your blood sugar three times a day and bring to clinic on follow up appointment with Dr. Michail Sermon    Treatment Goals:  Goals (1 Years of Data) as of 01/26/13         As of Today 01/07/13 01/07/13 01/07/13 01/07/13     Blood Pressure    . Blood Pressure < 140/90  135/72 154/89 154/102 110/78 153/67     Lifestyle    . Prevent Falls           Result Component    . HEMOGLOBIN A1C < 7.0          . LDL CALC < 100            Progress Toward Treatment Goals:  Treatment Goal 01/26/2013  Hemoglobin A1C unable to assess  Prevent falls unable to assess    Self Care Goals & Plans:  Self Care Goal 01/26/2013  Manage my medications take my medicines as prescribed  Monitor my health keep track of my blood glucose; bring my glucose meter and log to each visit  Eat healthy foods -    Home Blood Glucose Monitoring 01/26/2013  Check my blood sugar 3 times a day  When to check my blood sugar before meals     Care Management & Community Referrals:  Referral 01/26/2013  Referrals made for care management support other (see comment)  Referrals made to community resources -    Diabetic Neuropathy Diabetic neuropathy is a common complication caused by diabetes. Neuropathy is a term that means nerve disease or damage. If your diabetes is uncontrolled and you have high blood glucose (sugar) levels, over time, this can lead to damage to nerves throughout your body.  There are three types of diabetic neuropathy:   Peripheral.  Autonomic.  Focal. PERIPHERAL NEUROPATHY Peripheral neuropathy is the most common form of diabetic neuropathy. It causes damage to the nerves of the feet and legs and eventually the hands and arms.  SYMPTOMS  Peripheral neuropathy occurs slowly over time. The peripheral nerves sense touch, hot and cold, and pain. When these nerves no longer work:   Your feet become numb.  You can no longer feel pressure or pain in your feet.  You may have burning, stabbing or aching pain. This can lead to:  Thick calluses over pressure areas.  Pressure sores.  Ulcers. Ulcers can become infected with germs (bacteria) and can even lead to infection in the bones of the feet. DIAGNOSIS  The diagnosis of diabetic neuropathy is difficult at best. Sensory function testing can be done with:  Light touch using a monofilament.  Vibration with tuning fork.  Sharp sensation with pin prick Other tests that can help diagnose neuropathy are:  Nerve Conduction Velocities (NCV). This checks the transmission of electrical current through a nerve.  Electromyography (EMG). This shows how muscles respond to electrical signals transmitted by nearby nerves.  Quantitative sensory testing, which  is used to assess how your nerves respond to vibration and changes in temperature. AUTONOMIC NEUROPATHY The autonomic nervous system controls functions that you do not think about. Examples would be:   Heart beat.  Regulation of body temperature.  Blood pressure.  Urination.  Digestion.  Sweating.  Sexual function. SYMPTOMS  The symptoms of autonomic neuropathy vary depending on which nerves are affected.   There can be problems with digestion such as:  Feeling sick to your stomach (nausea).  Vomiting.  Bloating.  Constipation.  Diarrhea.  Abdominal pain.  Difficulty with urination may occur because of the inability to sense when your  bladder is full. You may have urine leakage (incontinence) or inability to empty your bladder completely (retention).  Palpitations or a feeling of an abnormal heart beat.  Blood pressure drops on arising (orthostatic hypotension). This can happen when you first sit up or stand up. It causes you to feel:  Dizzy.  Weak.  Faint.  Sexual functioning:  In men, inability to attain and maintain an erection.  In women, vaginal dryness and problems with decreased sexual desire and arousal. DIAGNOSIS  Diagnosis is often based on reported symptoms. Tell your medical caregiver if you experience:   Dizziness.  Constipation.  Diarrhea.  Inappropriate urination or inability to urinate.  Inability to get or maintain an erection. Tests that may be done include:  An EKG or Holter Monitor. These are tests that can help show problems with the heart rate or heart rhythm.  X-rays can be used to find if there are problems with your ability to properly empty food from your stomach into the small intestine after eating. FOCAL NEUROPATHY Focal neuropathy affects just one nerve tract and occurs suddenly. However, it usually improves by itself over time. It does not cause long term damage, and treatments are usually needed only until the problem improves. SYMPTOMS  Examples include:   Abnormal eye movements or abnormal alignment of both eyes.  Weakness in the wrist.  Foot drop, which results in inability to lift the foot properly. This causes abnormal walking or foot movement. DIAGNOSIS  Diagnosis is made based on your symptoms and what your caregiver finds on your exam. Other tests that may be done include:  Nerve Conduction Velocities (NCV). This checks the transmission of electrical current through a nerve.  Electromyography (EMG). This shows how muscles respond to electrical signals transmitted by nearby nerves.  Quantitative sensory testing, which is used to assess how your nerves  respond to vibration and changes in temperature. TREATMENT Once nerve damage occurs it cannot be reversed. The goal of treatment is to keep the disease from getting worse. If it gets worse, it will affect more nerve fibers. Controlling your blood (sugar) is the key. You will need to keep your blood glucose and A1c at the target range prescribed by your caregiver. Things that will help control blood glucose levels include:  Blood glucose monitoring.  Meal planning.  Physical activity.  Diabetes medication. Over time, maintaining lower blood glucose levels helps lessen symptoms. Sometimes, prescription pain medicine is needed. Focal neuropathy can be painful and unpredictable and occurs most often in older adults with diabetes.  SEEK MEDICAL CARE IF:   You develop peripheral nerve symptoms such as burning, numbness, or pain in your feet, legs or hands.  You develop autonomic nerve symptoms such as:  Dizziness.  Abnormal urinary control.  Inability to get an erection.  You develop focal nerve symptoms such as sudden abnormal eye movements  or sudden foot drop. Document Released: 11/25/2001 Document Revised: 12/09/2011 Document Reviewed: 02/24/2009 [facility] Patient Information 2013 [place].   Topiramate tablets for your neuropathy What is this medicine? TOPIRAMATE (toe PYRE a mate) is used to treat seizures in adults or children with epilepsy. It is also used for the prevention of migraine headaches. This medicine may be used for other purposes; ask your health care provider or pharmacist if you have questions. What should I tell my health care provider before I take this medicine? They need to know if you have any of these conditions: -cirrhosis of the liver or liver disease -diarrhea -glaucoma -kidney stones or kidney disease -lung disease like asthma, obstructive pulmonary disease, emphysema -metabolic acidosis -on a ketogenic diet -schedule for surgery or a  procedure -suicidal thoughts, plans, or attempt; a previous suicide attempt by you or a family member -an unusual or allergic reaction to topiramate, other medicines, foods, dyes, or preservatives -pregnant or trying to get pregnant -breast-feeding How should I use this medicine? Take this medicine by mouth with a glass of water. Follow the directions on the prescription label. Do not crush or chew. You may take this medicine with meals. Take your medicine at regular intervals. Do not take it more often than directed. Talk to your pediatrician regarding the use of this medicine in children. Special care may be needed. While this drug may be prescribed for children as young as 21 years of age for various seizure disorders, precautions do apply. Overdosage: If you think you have taken too much of this medicine contact a poison control center or emergency room at once. NOTE: This medicine is only for you. Do not share this medicine with others. What if I miss a dose? If you miss a dose, take it as soon as you can. If it is almost time for your next dose, take only that dose. Do not take double or extra doses. What may interact with this medicine? Do not take this medicine with any of the following medications: -probenecid This medicine may also interact with the following medications: -acetazolamide -amitriptyline -dichlorphenamide -digoxin -hydrochlorothiazide -lithium -medicines for pain, sleep, or muscle relaxation -methazolamide -other seizure or epilepsy medicines -pioglitazone -risperidone This list may not describe all possible interactions. Give your health care provider a list of all the medicines, herbs, non-prescription drugs, or dietary supplements you use. Also tell them if you smoke, drink alcohol, or use illegal drugs. Some items may interact with your medicine. What should I watch for while using this medicine? Visit your doctor or health care professional for regular checks  on your progress. Do not stop taking this medicine suddenly. This increases the risk of seizures if you are using this medicine to control epilepsy. Wear a medical identification bracelet or chain to say you have epilepsy or seizures, and carry a card that lists all your medicines. This medicine can decrease sweating and increase your body temperature. Watch for signs of deceased sweating or fever, especially in children. Avoid extreme heat, hot baths, and saunas. Be careful about exercising, especially in hot weather. Contact your health care provider right away if you notice a fever or decrease in sweating. You should drink plenty of fluids while taking this medicine. If you have had kidney stones in the past, this will help to reduce your chances of forming kidney stones. If you have stomach pain, with nausea or vomiting and yellowing of your eyes or skin, call your doctor immediately. You may get drowsy, dizzy,  or have blurred vision. Do not drive, use machinery, or do anything that needs mental alertness until you know how this medicine affects you. To reduce dizziness, do not sit or stand up quickly, especially if you are an older patient. Alcohol can increase drowsiness and dizziness. Avoid alcoholic drinks. If you notice blurred vision, eye pain, or other eye problems, seek medical attention at once for an eye exam. The use of this medicine may increase the chance of suicidal thoughts or actions. Pay special attention to how you are responding while on this medicine. Any worsening of mood, or thoughts of suicide or dying should be reported to your health care professional right away. This medicine may increase the chance of developing metabolic acidosis. If left untreated, this can cause kidney stones, bone disease, or slowed growth in children. Symptoms include breathing fast, fatigue, loss of appetite, irregular heartbeat, or loss of consciousness. Call your doctor immediately if you experience any  of these side effects. Also, tell your doctor about any surgery you plan on having while taking this medicine since this may increase your risk for metabolic acidosis. Birth control pills may not work properly while you are taking this medicine. Talk to your doctor about using an extra method of birth control. Women who become pregnant while using this medicine may enroll in the [place] Pregnancy Registry by calling [phone]. This registry collects information about the safety of antiepileptic drug use during pregnancy. What side effects may I notice from receiving this medicine? Side effects that you should report to your doctor or health care professional as soon as possible: -allergic reactions like skin rash, itching or hives, swelling of the face, lips, or tongue -decreased sweating and/or rise in body temperature -depression -difficulty breathing, fast or irregular breathing patterns -difficulty speaking -difficulty walking or controlling muscle movements -hearing impairment -redness, blistering, peeling or loosening of the skin, including inside the mouth -tingling, pain or numbness in the hands or feet -unusually weak or tired -worsening of mood, thoughts or actions of suicide or dying Side effects that usually do not require medical attention (report to your doctor or health care professional if they continue or are bothersome): -altered taste -back pain, joint or muscle aches and pains -diarrhea, or constipation -headache -loss of appetite -nausea -stomach upset, indigestion -tremors This list may not describe all possible side effects. Call your doctor for medical advice about side effects. You may report side effects to FDA at 1-800-FDA-1088. Where should I keep my medicine? Keep out of the reach of children. Store at room temperature between 15 and 30 degrees C (59 and 86 degrees F) in a tightly closed container. Protect from moisture. Throw  away any unused medicine after the expiration date. NOTE: This sheet is a summary. It may not cover all possible information. If you have questions about this medicine, talk to your doctor, pharmacist, or health care provider.  2013, [employer]. (11/12/2010 7:46:49 PM)

## 2013-01-26 NOTE — Assessment & Plan Note (Signed)
Limited mobility due to pain, unable to raise right arm greater than 15 degrees at this time.  Reports chronic right shoulder problems and ability to usually  "pop" back into place, however currently does not feel dislocation just pain.  Possible ligament injury or rotator cuff tear? Unable to go through any imaging at this time due to severe pain  -refer to sports medicine -f/u pain clinic -ice to shoulder prn -try using sling for support

## 2013-01-26 NOTE — Assessment & Plan Note (Addendum)
Presents with chronic severe uncontrolled polyneuropathy, notably worse on the dorsal surface of b/l feet and knees and arms.  She also has chronic right shoulder pain that is causing even more painful distress.  Pain today is 10+, tingling, burning, on and off numbness, and making it difficult to even walk.  She also reports hypersensitivity throughout.  Claims diabetes is more controlled now with CBGs no greater than 150 and expects her A1C to improve on next check.  She is compliant with her medications.  She is currently on gabapentin 600mg  TID, however admits to taking 1 pill up to 6 times a day recently with brief relief.    Discussed starting TCAs for chronic pain and to also help with depression, but she reports having tried amitriptyline in the past with little success and extreme drowsiness.  She is currently unemployed and will need to find a new job and cannot do so while being drowsy.  She cannot afford Lyrica and cymbalta makes her concern in regards to weight gain. Thus, we will try Topamax starting low dose and slowly titrate up to see if she has any improvement.  She may also benefit from possible weight loss with Topamax but has been educated on possible side effects including making depression worse and thus would need to stop use.  Other differentials for pain may also include possible fibromyalgia but difficult to assess all tender points due to pain from shoulder as well and extreme diffuse sensitivity.   -Topamax 25mg  qd -will refer to pain clinic due to severity of pain -advised not to drive if drowsy and monitor for increased drowsiness -if no improvement, consider trying TCA again, more affordable and may also help with depressive symptoms and monitor for drowsiness if she is willing to try it at that time  Lab Results  Component Value Date   HGBA1C 11.8 11/23/2012   HGBA1C 11.8 07/21/2012   HGBA1C 6.3 03/04/2012    Assessment: Diabetes control: poor control (HgbA1C  >9%) Progress toward A1C goal:  unable to assess Comments: recheck next month, May 2014 Plan: Medications:  Continue current medications: lantus 40 units qhs, novolog 3-5 units TID with meals, metformin 1000mg  BID Home glucose monitoring: Frequency: 3 times a day Timing: before meals Instruction/counseling given:reminded to bring meter to all visits, checked blood sugar daily, follow balanced diet, foot exams, eye exams

## 2013-01-27 NOTE — Addendum Note (Signed)
Addended by: Truddie Crumble on: 01/27/2013 02:42 PM   Modules accepted: Orders

## 2013-02-01 ENCOUNTER — Encounter: Payer: Self-pay | Admitting: Family Medicine

## 2013-02-01 ENCOUNTER — Ambulatory Visit (INDEPENDENT_AMBULATORY_CARE_PROVIDER_SITE_OTHER): Payer: No Typology Code available for payment source | Admitting: Family Medicine

## 2013-02-01 VITALS — BP 137/85 | HR 89 | Ht 62.0 in | Wt 175.0 lb

## 2013-02-01 DIAGNOSIS — M7500 Adhesive capsulitis of unspecified shoulder: Secondary | ICD-10-CM

## 2013-02-01 DIAGNOSIS — M7501 Adhesive capsulitis of right shoulder: Secondary | ICD-10-CM

## 2013-02-01 NOTE — Progress Notes (Signed)
INTERNAL MEDICINE TEACHING ATTENDING ADDENDUM - Sid Falcon, MD: I reviewed with the resident Dr. Eula Fried, Ms. Cardinas'  medical history, physical examination, diagnosis and results of tests and treatment and I agree with the patient's care as documented.

## 2013-02-05 DIAGNOSIS — M7501 Adhesive capsulitis of right shoulder: Secondary | ICD-10-CM | POA: Insufficient documentation

## 2013-02-05 NOTE — Progress Notes (Signed)
  Subjective:    Patient ID: Kayla [name], female    DOB: 06-25-1961, 52 y.o.   MRN: [id number]  HPI  Right shoulder pain chronic said has been worse over the last 3 weeks. She is to the point where she can barely use her right arm. She is right-hand dominant. Pain is located in the shoulder, worse with activity but some aching even at rest. No numbness or tingling it is unusual into her hands, she has pre-existing bilateral polyneuropathy. Her polyneuropathy in her hands this essentially unchanged. Feels like her shoulder pops out of place all the time.  PERTINENT  PMH / PSH: No history of documented shoulder dislocation, no history of shoulder surgery. Review of Systems Denies fever, sweats, chills.    Objective:   Physical Exam  Vital signs are reviewed GENERAL: Well-developed female no acute distress SHOULDER: Right. She will not elevate it above 90 in forward flexion or abduction. Passively I can elevated 210 of forward flexion but she has significant pain with this. Internal and external rotation strength is intact. Supraspinatus testing reveals intact strength but is painful. She's not able to perform liftoff test and cannot put her hand behind her back on the right side. Distally she has intact neurovascular status.      Assessment & Plan:  #1. Adhesive capsulitis, likely early in the process. Spent a long time greater than 50% of her 40 minute office visit talking about her normal course of frozen shoulder. I demonstrated exercises she is to do it I gave her a handout. I'll see her back in 3-4 weeks. At this time I don't think it would be beneficial to start high volume 0 corticosteroid injections into the glenohumeral joint but we would consider that in the future.

## 2013-02-09 ENCOUNTER — Encounter: Payer: Self-pay | Admitting: Dietician

## 2013-02-12 ENCOUNTER — Encounter: Payer: Self-pay | Admitting: Physical Medicine & Rehabilitation

## 2013-02-15 ENCOUNTER — Ambulatory Visit (INDEPENDENT_AMBULATORY_CARE_PROVIDER_SITE_OTHER): Payer: No Typology Code available for payment source | Admitting: Internal Medicine

## 2013-02-15 ENCOUNTER — Encounter: Payer: Self-pay | Admitting: Internal Medicine

## 2013-02-15 VITALS — BP 140/77 | HR 85 | Temp 98.7°F | Ht 62.0 in | Wt 173.0 lb

## 2013-02-15 DIAGNOSIS — F329 Major depressive disorder, single episode, unspecified: Secondary | ICD-10-CM

## 2013-02-15 DIAGNOSIS — E1140 Type 2 diabetes mellitus with diabetic neuropathy, unspecified: Secondary | ICD-10-CM

## 2013-02-15 DIAGNOSIS — E1142 Type 2 diabetes mellitus with diabetic polyneuropathy: Secondary | ICD-10-CM

## 2013-02-15 DIAGNOSIS — M7501 Adhesive capsulitis of right shoulder: Secondary | ICD-10-CM

## 2013-02-15 DIAGNOSIS — E039 Hypothyroidism, unspecified: Secondary | ICD-10-CM

## 2013-02-15 DIAGNOSIS — E785 Hyperlipidemia, unspecified: Secondary | ICD-10-CM

## 2013-02-15 DIAGNOSIS — M7500 Adhesive capsulitis of unspecified shoulder: Secondary | ICD-10-CM

## 2013-02-15 DIAGNOSIS — I10 Essential (primary) hypertension: Secondary | ICD-10-CM | POA: Insufficient documentation

## 2013-02-15 DIAGNOSIS — E1149 Type 2 diabetes mellitus with other diabetic neurological complication: Secondary | ICD-10-CM

## 2013-02-15 LAB — POCT GLYCOSYLATED HEMOGLOBIN (HGB A1C): Hemoglobin A1C: 5.9

## 2013-02-15 LAB — TSH: TSH: 63.72 u[IU]/mL — ABNORMAL HIGH (ref 0.350–4.500)

## 2013-02-15 LAB — LIPID PANEL
Cholesterol: 183 mg/dL (ref 0–200)
HDL: 48 mg/dL (ref >39)
LDL Cholesterol: 116 mg/dL — ABNORMAL HIGH (ref 0–99)
Total CHOL/HDL Ratio: 3.8 Ratio
Triglycerides: 97 mg/dL (ref <150)
VLDL: 19 mg/dL (ref 0–40)

## 2013-02-15 LAB — GLUCOSE, CAPILLARY: Glucose-Capillary: 234 mg/dL — ABNORMAL HIGH (ref 70–99)

## 2013-02-15 MED ORDER — TOPIRAMATE 50 MG PO TABS: 50.0000 mg | ORAL_TABLET | Freq: Two times a day (BID) | ORAL | Status: DC

## 2013-02-15 MED ORDER — OLMESARTAN MEDOXOMIL 20 MG PO TABS: 20.0000 mg | ORAL_TABLET | Freq: Every day | ORAL | Status: DC

## 2013-02-15 MED ORDER — TOPIRAMATE 25 MG PO TABS: 50.0000 mg | ORAL_TABLET | Freq: Every day | ORAL | Status: DC

## 2013-02-15 NOTE — Progress Notes (Signed)
Subjective:    Patient ID: Kayla [name], female    DOB: [birth year], 52 y.o.   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 Pt presents for general  follow-up.  At prior appt she had declined TSH check because she had been noncompliant with therapy.  She is willing to have this checked today.  She states that her depression is "the same" and has been lifelong for her and is unwilling to talk to counselors "so they can write down the thoughts in my head".  She was recently evaluated by Dr. Nori Riis of Sports Medicine and dx with adhesive capsulitis of the shoulder.  She was given exercises to do at home but steroidal injections were defered. She states that she has been doing the recommended exercises at home with some improvement in her shoulder mobility. She has been referred to the Pain Clinic with Dr. Tessa Lerner March 09, 2013.  She states that she has an appointment at [facility] for Neurology and Rheumatology. She has intolerance to ACEi inhibitors but would benefit from ARB therapy given her poorly controlled diabetes.  Barriers to ARBs are obtaining medications which are difficult to obtain with the [facility]) and noncompliance. She states that the Topimax helps with her arm pain but "wears off in 2 hours". She is able to get this from the [employer] on [address] with a coupon. (~$5). She reports compliance with her Metformin 1000 mg bid and Lantus 40 Units qd as well as meal time insulin.  Colonoscopy was normal per Dr. Carlean Purl with exception of anal warts.   Review of Systems  Constitutional: Positive for fatigue. Negative for fever.  HENT: Negative for congestion.   Respiratory: Positive for cough. Negative for shortness of breath.   Cardiovascular: Negative for chest pain and leg swelling.  Gastrointestinal:  Negative for diarrhea and constipation.  Genitourinary: Negative for dysuria.  Musculoskeletal: Positive for myalgias and arthralgias.  Skin: Negative for rash.  Neurological: Positive for numbness.  Psychiatric/Behavioral: Positive for dysphoric mood. Negative for suicidal ideas.       Objective:   Physical Exam  Constitutional: She is oriented to person, place, and time. She appears well-developed and well-nourished. No distress.  HENT:  Head: Normocephalic and atraumatic.  Neck: Normal range of motion. Neck supple. No thyromegaly present.  Cardiovascular: Normal rate, regular rhythm, normal heart sounds and intact distal pulses.   No murmur heard. Pulmonary/Chest: Effort normal and breath sounds normal.  Abdominal: Soft. Bowel sounds are normal. There is no tenderness.  Musculoskeletal:       Right shoulder: She exhibits tenderness.       Right wrist: She exhibits tenderness.       Left wrist: She exhibits tenderness.       Right hip: She exhibits tenderness.       Left hip: She exhibits tenderness.       Cervical back: She exhibits tenderness.       Thoracic back: She exhibits tenderness.       Lumbar back: She exhibits tenderness.  Multiple point tenderness to palpation  Neurological: She is alert and oriented to person, place, and time.  Skin: Skin is warm and dry.  Psychiatric: She has a normal mood and affect. Her behavior is normal. Judgment and thought content normal.          Assessment &  Plan:  1. DM Type2 with Neuropathy: intolerant Amitriptyline, on Gabapentin and Topimax, HgbA1c 5.9 -will increase Topimax to 50 mg qd -appt with [facility] -pt compliant with antiglycemics at this point, if continues will decrease regimen  2. Adhesive Capsulitis: managed per Sports Medicine  3. Hypothyroidism: check TSH today  4. Depression: stable  5. Fibromyalgia, likely -appt with [facility] and [facility]  6. UTI: s/p Cipro course,  resolved  7. Hypertension: start ARB therapy as pt intolerant to ACE causing weakness and nausea -olmesartan 20 mg available through [facility]  8. Hyperlipidemia: will check lipid panel

## 2013-02-15 NOTE — Patient Instructions (Addendum)
General Instructions:  Your diabetes control is much better.  If it continues in this pattern, we will decrease the Metformin at your next visit. Keep all of your appointments with Sports Medicine, Neurology, and  Pain Clinic. I will send a letter to [facility] about your difficulty with standing or sitting for long periods. Go to the [facility] for the new blood pressure medication which will help protect your kidneys and heart. Follow-up with me in 3 months.  Treatment Goals:  Goals (1 Years of Data) as of 02/15/13         As of Today 02/01/13 01/26/13 01/07/13 01/07/13     Blood Pressure    . Blood Pressure < 140/90  140/77 137/85 135/72 154/89 154/102     Lifestyle    . Prevent Falls           Result Component    . HEMOGLOBIN A1C < 7.0  5.9        . LDL CALC < 100            Progress Toward Treatment Goals:  Treatment Goal 02/15/2013  Hemoglobin A1C at goal  Prevent falls improved    Self Care Goals & Plans:  Self Care Goal 02/15/2013  Manage my medications take my medicines as prescribed; bring my medications to every visit; refill my medications on time  Monitor my health -  Eat healthy foods drink diet soda or water instead of juice or soda; eat more vegetables; eat foods that are low in salt    Home Blood Glucose Monitoring 02/15/2013  Check my blood sugar 3 times a day  When to check my blood sugar before meals     Care Management & Community Referrals:  Referral 01/26/2013  Referrals made for care management support other (see comment)  Referrals made to community resources -

## 2013-02-15 NOTE — Assessment & Plan Note (Addendum)
Lab Results  Component Value Date   HGBA1C 5.9 02/15/2013   HGBA1C 11.8 11/23/2012   HGBA1C 11.8 07/21/2012     Assessment: Diabetes control: good control (HgbA1C at goal) Progress toward A1C goal:  at goal Comments: compliant with medications, if next HGBA1c low will decrease dosing of either metformin or Lantus  Plan: Medications:  continue current medications Home glucose monitoring: Frequency: 3 times a day Timing: before meals Instruction/counseling given: reminded to bring blood glucose meter & log to each visit, reminded to bring medications to each visit and discussed diet Educational resources provided: brochure Self management tools provided: home glucose logbook Other plans: will continue Lantus 40 Units qd and Metformin 1000 mg bid, pt educated on hypoglycemic signs, will re-assess in 3 months. Also increased dose of Topimax to 50 mg qd.

## 2013-02-15 NOTE — Assessment & Plan Note (Signed)
  BP Readings from Last 3 Encounters:  02/15/13 140/77  02/01/13 137/85  01/26/13 135/72    Lab Results  Component Value Date   NA 136 08/24/2012   K 3.9 08/24/2012   CREATININE 1.13* 08/24/2012    Assessment: Blood pressure control:   Progress toward BP goal:    Comments: Elevated sbp >135 at last few appts.  Pt declining to start antihypertensive at this time.  I have convinced her  To take ARB if we can get it from [facility] as this will be renal and cardio protective given her poorly controlled DM. She is agreeable.  Plan: Medications:  Will start on olmesartan 20 mg.  Pt  will get from [facility]. Educational resources provided:   Self management tools provided:   Other plans: start ARB therapy today.  [facility] will give 14 day supply today then order 3 month supply with 1 year refill.

## 2013-02-22 NOTE — Progress Notes (Signed)
INTERNAL MEDICINE TEACHING ATTENDING ADDENDUM: I discussed this case with Dr. Michail Sermon soon after the patient visit. I have read the documentation and I agree with the plan of care. Please see the resident note for details of management.

## 2013-03-04 DIAGNOSIS — R768 Other specified abnormal immunological findings in serum: Secondary | ICD-10-CM | POA: Insufficient documentation

## 2013-03-04 DIAGNOSIS — IMO0001 Reserved for inherently not codable concepts without codable children: Secondary | ICD-10-CM | POA: Insufficient documentation

## 2013-03-04 DIAGNOSIS — M797 Fibromyalgia: Secondary | ICD-10-CM | POA: Insufficient documentation

## 2013-03-04 DIAGNOSIS — R894 Abnormal immunological findings in specimens from other organs, systems and tissues: Secondary | ICD-10-CM | POA: Insufficient documentation

## 2013-03-05 ENCOUNTER — Encounter
Payer: No Typology Code available for payment source | Attending: Physical Medicine & Rehabilitation | Admitting: Physical Medicine & Rehabilitation

## 2013-03-05 ENCOUNTER — Encounter: Payer: Self-pay | Admitting: Physical Medicine & Rehabilitation

## 2013-03-05 VITALS — BP 153/55 | HR 87 | Resp 14 | Ht 62.0 in | Wt 174.0 lb

## 2013-03-05 DIAGNOSIS — Z5987 Material hardship due to limited financial resources, not elsewhere classified: Secondary | ICD-10-CM | POA: Insufficient documentation

## 2013-03-05 DIAGNOSIS — M797 Fibromyalgia: Secondary | ICD-10-CM

## 2013-03-05 DIAGNOSIS — Z598 Other problems related to housing and economic circumstances: Secondary | ICD-10-CM | POA: Insufficient documentation

## 2013-03-05 DIAGNOSIS — Z5989 Other problems related to housing and economic circumstances: Secondary | ICD-10-CM | POA: Insufficient documentation

## 2013-03-05 DIAGNOSIS — IMO0001 Reserved for inherently not codable concepts without codable children: Secondary | ICD-10-CM

## 2013-03-05 DIAGNOSIS — F329 Major depressive disorder, single episode, unspecified: Secondary | ICD-10-CM

## 2013-03-05 DIAGNOSIS — M719 Bursopathy, unspecified: Secondary | ICD-10-CM | POA: Insufficient documentation

## 2013-03-05 DIAGNOSIS — E1142 Type 2 diabetes mellitus with diabetic polyneuropathy: Secondary | ICD-10-CM

## 2013-03-05 DIAGNOSIS — F3289 Other specified depressive episodes: Secondary | ICD-10-CM | POA: Insufficient documentation

## 2013-03-05 DIAGNOSIS — G8929 Other chronic pain: Secondary | ICD-10-CM | POA: Insufficient documentation

## 2013-03-05 DIAGNOSIS — M7500 Adhesive capsulitis of unspecified shoulder: Secondary | ICD-10-CM | POA: Insufficient documentation

## 2013-03-05 DIAGNOSIS — E1149 Type 2 diabetes mellitus with other diabetic neurological complication: Secondary | ICD-10-CM

## 2013-03-05 DIAGNOSIS — M67919 Unspecified disorder of synovium and tendon, unspecified shoulder: Secondary | ICD-10-CM | POA: Insufficient documentation

## 2013-03-05 DIAGNOSIS — Z79899 Other long term (current) drug therapy: Secondary | ICD-10-CM

## 2013-03-05 DIAGNOSIS — M25529 Pain in unspecified elbow: Secondary | ICD-10-CM | POA: Insufficient documentation

## 2013-03-05 DIAGNOSIS — E039 Hypothyroidism, unspecified: Secondary | ICD-10-CM | POA: Insufficient documentation

## 2013-03-05 DIAGNOSIS — E1140 Type 2 diabetes mellitus with diabetic neuropathy, unspecified: Secondary | ICD-10-CM

## 2013-03-05 DIAGNOSIS — Z5181 Encounter for therapeutic drug level monitoring: Secondary | ICD-10-CM

## 2013-03-05 DIAGNOSIS — M549 Dorsalgia, unspecified: Secondary | ICD-10-CM | POA: Insufficient documentation

## 2013-03-05 DIAGNOSIS — M25569 Pain in unspecified knee: Secondary | ICD-10-CM | POA: Insufficient documentation

## 2013-03-05 DIAGNOSIS — M7501 Adhesive capsulitis of right shoulder: Secondary | ICD-10-CM

## 2013-03-05 DIAGNOSIS — M79609 Pain in unspecified limb: Secondary | ICD-10-CM | POA: Insufficient documentation

## 2013-03-05 DIAGNOSIS — M542 Cervicalgia: Secondary | ICD-10-CM | POA: Insufficient documentation

## 2013-03-05 MED ORDER — GABAPENTIN 800 MG PO TABS: 800.0000 mg | ORAL_TABLET | Freq: Three times a day (TID) | ORAL | Status: DC

## 2013-03-05 MED ORDER — AMITRIPTYLINE HCL 10 MG PO TABS: 10.0000 mg | ORAL_TABLET | Freq: Every day | ORAL | Status: DC

## 2013-03-05 NOTE — Patient Instructions (Signed)
CONTINUE WITH YOUR SYNTHROID.  ADD A MULTIVITAMIN WITH VITAMIN D AND B12 TO YOUR DAILY REGIMEN  WE WILL CHECK LABWORK AT YOUR NEXT VISIT

## 2013-03-05 NOTE — Addendum Note (Signed)
Addended by: Amado Coe on: 03/05/2013 01:38 PM   Modules accepted: Orders

## 2013-03-05 NOTE — Progress Notes (Signed)
Subjective:    Patient ID: Kayla [name], female    DOB: 06/24/1961, 52 y.o.   MRN: [id number]  HPI  This is an initial visit for Mrs. Kayla [name] for chronic pain. She has had increasing pain literally for two decades or more. The pain has steadily increased over the last 10+ years especially. She complains of the most pain in her feet/hands/arms/elbows/knees. To a lesser extent she has pain in her neck and back. She's hasn't had xrays of her neck or back since before 2000. She was just at [facility] yesterday and was dx'ed with FMS.   She suffers from Type 2 DM with severe neuropathy which extends from her hands to elbows and from her feet to her knees. Diabetes has been better controlled. Her last hgb A1c is around 5. She's on gabapentin and topamax for this pain. The gabapentin helps ,but she has not always been able to take it consistently due to financial considerations. Her pain is worse at the end of the day, after she's stood or walked any long period of time, when she's stresssed, etc. For pain relief, she tries to limit her walking, uses diversion etc.    She is also hypothyroid and on synthroid. She had been off of for a time due to financial considerations. Her last TSH was 73 two weeks ago.    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 She still lives in [place] with her daughter, son, and granddaughter. She occasionally drinks ETOH. Doesn't smoke.   Pain Inventory Average Pain 7 Pain Right Now 7 My pain is intermittent, constant, sharp, burning, stabbing, tingling and aching  In the last 24 hours, has pain interfered with the following? General activity 6 Relation with others 5 Enjoyment of life 8 What TIME of day is your pain at its worst? constant Sleep (in general) Poor  Pain is worse with: walking, sitting and standing Pain improves with: pacing activities and medication Relief from Meds:  5  Mobility walk without assistance ability to climb steps?  yes do you drive?  yes  Function not employed: date last employed 04/12  Neuro/Psych weakness numbness tingling trouble walking dizziness depression anxiety  Prior Studies bone scan x-rays CT/MRI nerve study  Physicians involved in your care Primary care . Rheumatologist .   Family History  Problem Relation Age of Onset  . Emphysema Mother   . Cancer Mother     unknown  . Hypertension Mother   . Cancer Father     pancreatic   . Diabetes Father   . Hypertension Father   . Down syndrome Brother   . Cancer Maternal Aunt     cancer  . Cancer Other     breast cancer aunt  . Stroke Sister   . Diabetes Sister   . Colon cancer Neg Hx   . Stomach cancer Neg Hx    History   Social History  . Marital Status: Single    Spouse Name: N/A    Number of Children: N/A  . Years of Education: 12   Occupational History  .     Social History Main Topics  . Smoking status: Former Smoker -- 5 years    Types: Cigarettes    Quit date: 09/30/1990  . Smokeless tobacco: Never Used  . Alcohol Use: No     Comment: Former use, denies heavy use/dependence/abuse  . Drug Use: No  . Sexually Active: Not  Currently   Other Topics Concern  . None   Social History Narrative   Lives in [place], alone. Looking for a job. Has a son in town who checks in on her.    Past Surgical History  Procedure Laterality Date  . Appendectomy  1986  . Cesarean section  1986, 1993   Past Medical History  Diagnosis Date  . ANA POSITIVE 02/09/2010  . Type 2 diabetes mellitus   . Hyperlipidemia   . Dizziness     Multiple falls at home  . Diabetic neuropathy     Of hands and feet  . Sickle cell trait   . Fibromyalgia   . Hypothyroidism   . Deafness in right ear 2012    in the process of treatment  . Anal warts 11/09/2012   BP 153/55  Pulse 87  Resp 14  Ht 5\' 2"  (1.575 m)  Wt 174 lb (78.926 kg)  BMI 31.82 kg/m2   SpO2 97%     Review of Systems  Constitutional: Positive for appetite change and unexpected weight change.  Respiratory: Positive for cough.   Gastrointestinal: Positive for constipation.  Musculoskeletal: Positive for myalgias, arthralgias and gait problem.  Neurological: Positive for dizziness, weakness and numbness.  Psychiatric/Behavioral: Positive for confusion and dysphoric mood.  All other systems reviewed and are negative.       Objective:   Physical Exam  General: Alert and oriented x 3, No apparent distress HEENT: Head is normocephalic, atraumatic, PERRLA, EOMI, sclera anicteric, oral mucosa pink and moist, dentition intact, ext ear canals clear,  Neck: Supple without JVD or lymphadenopathy Heart: Reg rate and rhythm. No murmurs rubs or gallops Chest: CTA bilaterally without wheezes, rales, or rhonchi; no distress Abdomen: Soft, non-tender, non-distended, bowel sounds positive. Extremities: No clubbing, cyanosis, or edema. Pulses are 2+ Skin: Clean and intact without signs of breakdown Neuro: Pt is cognitively appropriate with normal insight, memory, and awareness. Cranial nerves 2-12 are intact. Sensory exam is diminished in a stocking glove distribution to above the elbows in both arms and above the knees in both legs. Distal limbs are almost allodynic.  Reflexes are 1+ in all 4's. Fine motor coordination is intact. No tremors. Motor function is limited due to pain. She was unable to fully grip with the right hand. Overall aside where she was limited by pain/ortho issues her strength was 4/5.  Musculoskeletal: she is limited with right shoulder rom and has difficulty with simple ABduction, IR/ER of the shoulder was also limited. She could assist her arm over her head but cannot get it there on its own. Posture was fair. She walks with antalgia on either leg. Psych: Pt's affect is appropriate but she is a little anxious. She was very pleasant and polite.          Assessment & Plan:  1. Type II DM with severe peripheral neuropathy 2. ?FMS 3. 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 Hypothyroidism 5. Depression  Plan: 1. Her biggest problem far and away his her neuropathy. WE will increase her gabapentin up to 800mg  qid ultimately. She may stay on the topamax also. Gave her info to help with financial assist for the gabapentin.  2. Added elavil 10-20mg  for sleep, pain, and mood. We will titrate this further if needed. 3. Needs to continue with her synthroid. Will recheck labs in about 6 weeks, including a FULL thyroid panel. Also would like to check B12, vitamin d, etc. Recommended vitamin supplementation in the meantime  4. Needs an antidepressant beyond the TCA. Consider celexa trial. 5. Follow up in 6 weeks. 45 minutes of face to face patient care time were spent during this visit. All questions were encouraged and answered.

## 2013-03-09 ENCOUNTER — Telehealth: Payer: Self-pay | Admitting: *Deleted

## 2013-03-09 NOTE — Telephone Encounter (Signed)
[facility] calls and states that protein in pt's urine was very hi and pt needs nephrology consult, faxed records , appt made dr ziemer 6/11 @ 1415, records given to dr ziemer

## 2013-03-10 ENCOUNTER — Encounter: Payer: Self-pay | Admitting: Dietician

## 2013-03-10 ENCOUNTER — Encounter: Payer: Self-pay | Admitting: Internal Medicine

## 2013-03-10 ENCOUNTER — Ambulatory Visit (INDEPENDENT_AMBULATORY_CARE_PROVIDER_SITE_OTHER): Payer: No Typology Code available for payment source | Admitting: Internal Medicine

## 2013-03-10 VITALS — BP 144/85 | HR 75 | Temp 98.1°F | Ht 62.0 in | Wt 176.7 lb

## 2013-03-10 DIAGNOSIS — E1149 Type 2 diabetes mellitus with other diabetic neurological complication: Secondary | ICD-10-CM

## 2013-03-10 DIAGNOSIS — R809 Proteinuria, unspecified: Secondary | ICD-10-CM

## 2013-03-10 DIAGNOSIS — E1140 Type 2 diabetes mellitus with diabetic neuropathy, unspecified: Secondary | ICD-10-CM

## 2013-03-10 DIAGNOSIS — E039 Hypothyroidism, unspecified: Secondary | ICD-10-CM

## 2013-03-10 DIAGNOSIS — E1142 Type 2 diabetes mellitus with diabetic polyneuropathy: Secondary | ICD-10-CM

## 2013-03-10 DIAGNOSIS — I10 Essential (primary) hypertension: Secondary | ICD-10-CM

## 2013-03-10 DIAGNOSIS — R894 Abnormal immunological findings in specimens from other organs, systems and tissues: Secondary | ICD-10-CM

## 2013-03-10 LAB — GLUCOSE, CAPILLARY: Glucose-Capillary: 358 mg/dL — ABNORMAL HIGH (ref 70–99)

## 2013-03-10 MED ORDER — LEVOTHYROXINE SODIUM 100 MCG PO TABS: 100.0000 ug | ORAL_TABLET | Freq: Every day | ORAL | Status: DC

## 2013-03-10 MED ORDER — OLMESARTAN MEDOXOMIL 40 MG PO TABS: 40.0000 mg | ORAL_TABLET | Freq: Every day | ORAL | Status: DC

## 2013-03-10 NOTE — Progress Notes (Signed)
Patient ID: Kayla [name], female   DOB: 1961-01-21, 52 y.o.   MRN: [id number]  Subjective:   Patient ID: Kayla [name] female   DOB: 08-Oct-1960 52 y.o.   MRN: [id number]  HPI: [name] is a 52 y.o. female with history of DM with neuropathy, fibromyalgia, fall risk presenting to the clinic for follow up of joint pains. She had been diagnosed with fibromyalgia in the passed and referred to [facility] rheumatology for evaluation and further workup for potentially uncharacterized connective tissue disorder. She did have one ANA done in 2011 with high titer 1:1280 and speckeld pattern. Labs at [facility] including direct coombs, lupus inhibitor, cardiolpiin Ab, beta-2-glyco Ab, antiSMith, anti-dsDNA, and C3/C4 were unrevealing. Repeat ANA negative. Work up notable only for protein:Cr ratio of 1908 mg/g. She has history of proteinuria, likely diabetic nephropathy +/- hypertensive nephropathy. No edema of legs or hypoalbuminemia. In regards to DM, stopped taking metformin in 1000 mg twice a day over the cord because she said her blood sugars were so well controlled at her last visit with an A1c of 5.9. She's continued Lantus 40 units at night and mealtime NovoLog. She's been 2 weeks without metformin. Her blood sugars have been increasing and she says she feels "off." Last TSH was 62, she's not had recent upper titration of her Synthroid. She's taking 75 mcg per day. At her last visit, was substituted and are due to inability to tolerate Ace inhibitor due to nonspecific side effects. She is on Benicar 20 mg daily. She's concerned that this is not controlling her blood pressure.   Past Medical History  Diagnosis Date  . ANA POSITIVE 02/09/2010  . Type 2 diabetes mellitus   . Hyperlipidemia   . Dizziness     Multiple falls at home  . Diabetic neuropathy     Of hands and feet  . Sickle cell trait   . Fibromyalgia   . Hypothyroidism   . Deafness in right ear 2012    in the process of treatment  . Anal warts  11/09/2012   Current Outpatient Prescriptions  Medication Sig Dispense Refill  . ACCU-CHEK FASTCLIX LANCETS MISC 1 each by Does not apply route 4 (four) times daily -  before meals and at bedtime.  100 each  11  . amitriptyline (ELAVIL) 10 MG tablet Take 1-2 tablets (10-20 mg total) by mouth at bedtime.  60 tablet  3  . Blood Glucose Monitoring Suppl (ACCU-CHEK AVIVA PLUS) W/DEVICE KIT 1 kit by Does not apply route as directed.  1 kit  0  . Blood Glucose Monitoring Suppl (ACCU-CHEK NANO SMARTVIEW) W/DEVICE KIT 1 each by Does not apply route 4 (four) times daily -  before meals and at bedtime.  1 kit  0  . gabapentin (NEURONTIN) 800 MG tablet Take 1 tablet (800 mg total) by mouth 4 (four) times daily - after meals and at bedtime.  120 tablet  4  . insulin aspart (NOVOLOG FLEXPEN) 100 UNIT/ML injection Inject 3-5 Units into the skin 3 (three) times daily before meals.  1 vial  6  . insulin glargine (LANTUS SOLOSTAR) 100 UNIT/ML injection Inject 40 units at bedtime  15 mL  10  . levothyroxine (SYNTHROID) 75 MCG tablet Take 1 tablet (75 mcg total) by mouth daily.  30 tablet  11  . metFORMIN (GLUCOPHAGE) 1000 MG tablet Take 1 tablet (1,000 mg total) by mouth 2 (two) times daily with a meal.  60 tablet  6  . olmesartan (BENICAR) 20 MG  tablet Take 1 tablet (20 mg total) by mouth daily.  30 tablet  12  . topiramate (TOPAMAX) 50 MG tablet Take 1 tablet (50 mg total) by mouth 2 (two) times daily.  30 tablet  6  . zolpidem (AMBIEN) 5 MG tablet Take 10 mg by mouth at bedtime as needed. Takes 2 (5mg ) tablets       No current facility-administered medications for this visit.   Family History  Problem Relation Age of Onset  . Emphysema Mother   . Cancer Mother     unknown  . Hypertension Mother   . Cancer Father     pancreatic   . Diabetes Father   . Hypertension Father   . Down syndrome Brother   . Cancer Maternal Aunt     cancer  . Cancer Other     breast cancer aunt  . Stroke Sister   .  Diabetes Sister   . Colon cancer Neg Hx   . Stomach cancer Neg Hx    History   Social History  . Marital Status: Single    Spouse Name: N/A    Number of Children: N/A  . Years of Education: 12   Occupational History  .     Social History Main Topics  . Smoking status: Former Smoker -- 5 years    Types: Cigarettes    Quit date: 09/30/1990  . Smokeless tobacco: Never Used  . Alcohol Use: No     Comment: Former use, denies heavy use/dependence/abuse  . Drug Use: No  . Sexually Active: Not Currently   Other Topics Concern  . None   Social History Narrative   Lives in [place], alone. Looking for a job. Has a son in town who checks in on her.    Review of Systems: 10 pt ROS performed, pertinent positives and negatives noted in HPI Objective:  Physical Exam: Filed Vitals:   03/10/13 1420  BP: 152/98  Pulse: 85  Temp: 98.1 F (36.7 C)  TempSrc: Oral  Height: 5\' 2"  (1.575 m)  Weight: 176 lb 11.2 oz (80.151 kg)  SpO2: 98%   Constitutional: Vital signs reviewed.  Patient is a well-developed and well-nourished female in no acute distress and cooperative with exam. Alert and oriented x3.  Head: Normocephalic and atraumatic Mouth: no erythema or exudates, MMM Eyes: PERRL, EOMI, conjunctivae normal, No scleral icterus.  Neck: Supple, Trachea midline normal ROM, no mass appreciated Cardiovascular: RRR, S1 normal, S2 normal, no MRG, pulses symmetric and intact bilaterally Pulmonary/Chest: CTAB, no wheezes, rales, or rhonchi Abdominal: Soft. Non-tender, non-distended, bowel sounds are normal, no masses, organomegaly, or guarding present.  Musculoskeletal: No joint deformities, erythema, or stiffness, ROM full and no nontender Hematology: no cervical adenopathy or visible bruising Neurological: A&O x3, cranial nerve II-XII are grossly intact, no focal motor deficit Skin: Warm, dry and intact. No rash, cyanosis, or clubbing.  Psychiatric: Normal mood and affect. Assessment &  Plan:   Please see problem-based charting for assessment and plan.

## 2013-03-10 NOTE — Assessment & Plan Note (Signed)
Lab Results  Component Value Date   TSH 63.720* 02/15/2013   On levothyroxine 46mcg. Will increase to 164mcg today, recheck TSH next visit

## 2013-03-10 NOTE — Assessment & Plan Note (Signed)
Random protein:Cr elevated 1908 at [facility]. Has history of proteinuria, likely diabetic nephropathy. Cr is 1.04 at [facility]. No evidence of edema, decompensation. - Encouraged compliance w ARB which i will uptitrate today, as well as better control DM, HTN - No indication for nephrology referral at this time

## 2013-03-10 NOTE — Assessment & Plan Note (Signed)
She had been diagnosed with fibromyalgia in the passed and referred to [facility] rheumatology for evaluation and further workup for potentially uncharacterized connective tissue disorder. She did have one ANA done in 2011 with high titer 1:1280 and speckeld pattern. Labs at [facility] including direct coombs, lupus inhibitor, cardiolpiin Ab, beta-2-glyco Ab, antiSMith, anti-dsDNA, and C3/C4 were unrevealing. Repeat ANA negative.  - No clear CTD at this time, ANA is now negative on repaet - Continue to treat fibromyalgia

## 2013-03-10 NOTE — Patient Instructions (Signed)
1. Start back metformin and keep taking insulin.  2. Increase your synthroid to 122mcg daily. I have written out a new prescription. 3. Increase benicar to 40mg  daily. I have written a new prescription. 4. Come back in 3 months

## 2013-03-10 NOTE — Assessment & Plan Note (Signed)
Lab Results  Component Value Date   HGBA1C 5.9 02/15/2013   HGBA1C 11.8 11/23/2012   HGBA1C 11.8 07/21/2012     Assessment: Progress toward A1C goal:  deteriorated Comments: Pt stopped taking metformin bc she thought she didn't need it   Plan: Medications:  RESTART metformin, continue lantus 40 mealtime novolog

## 2013-03-10 NOTE — Progress Notes (Signed)
Case discussed with Dr. Ziemer at the time of the visit.  We reviewed the resident's history and exam and pertinent patient test results.  I agree with the assessment, diagnosis and plan of care documented in the resident's note. 

## 2013-03-10 NOTE — Assessment & Plan Note (Signed)
BP Readings from Last 3 Encounters:  03/10/13 144/85  03/05/13 153/55  02/15/13 140/77    Lab Results  Component Value Date   NA 136 08/24/2012   K 3.9 08/24/2012   CREATININE 1.13* 08/24/2012    Assessment: Blood pressure control: mildly elevated Progress toward BP goal:  unchanged   Plan: Medications:  Increase olmesartan to 40mg  daily

## 2013-03-22 ENCOUNTER — Ambulatory Visit (INDEPENDENT_AMBULATORY_CARE_PROVIDER_SITE_OTHER): Payer: No Typology Code available for payment source | Admitting: Family Medicine

## 2013-03-22 VITALS — BP 151/79 | Ht 62.0 in | Wt 173.0 lb

## 2013-03-22 DIAGNOSIS — M7500 Adhesive capsulitis of unspecified shoulder: Secondary | ICD-10-CM

## 2013-03-22 DIAGNOSIS — M7501 Adhesive capsulitis of right shoulder: Secondary | ICD-10-CM

## 2013-03-22 DIAGNOSIS — E1140 Type 2 diabetes mellitus with diabetic neuropathy, unspecified: Secondary | ICD-10-CM

## 2013-03-22 DIAGNOSIS — E1142 Type 2 diabetes mellitus with diabetic polyneuropathy: Secondary | ICD-10-CM

## 2013-03-22 DIAGNOSIS — E1165 Type 2 diabetes mellitus with hyperglycemia: Secondary | ICD-10-CM

## 2013-03-22 DIAGNOSIS — E1149 Type 2 diabetes mellitus with other diabetic neurological complication: Secondary | ICD-10-CM

## 2013-03-22 DIAGNOSIS — M4712 Other spondylosis with myelopathy, cervical region: Secondary | ICD-10-CM

## 2013-03-22 MED ORDER — DIAZEPAM 5 MG PO TABS: ORAL_TABLET | ORAL | Status: DC

## 2013-03-23 ENCOUNTER — Ambulatory Visit
Admission: RE | Admit: 2013-03-23 | Discharge: 2013-03-23 | Disposition: A | Discharge status: Home / Self Care | Payer: No Typology Code available for payment source | Source: Ambulatory Visit | Attending: Family Medicine | Admitting: Family Medicine

## 2013-03-23 ENCOUNTER — Other Ambulatory Visit: Payer: No Typology Code available for payment source

## 2013-03-23 DIAGNOSIS — M4712 Other spondylosis with myelopathy, cervical region: Secondary | ICD-10-CM

## 2013-03-23 NOTE — Progress Notes (Signed)
  Subjective:    Patient ID: Kayla [name], female    DOB: 1961-05-27, 52 y.o.   MRN: [id number]  HPI  Followup right shoulder pain. A little bit better range of motion but pain is essentially the same. She's been trying to do the exercises regularly.  Review of Systems     Objective:   Physical Exam  Vital signs are reviewed GENERAL: Well-developed female no acute distress  Right. She will not elevate it above 90 in forward flexion or abduction. Passively I can elevated 210 of forward flexion but she has significant pain with this. Internal and external rotation strength is intact. Supraspinatus testing reveals intact strength but is painful. She's not able to perform lift off test and chem place her hands to the mid buttock portion at about L5. Notably her Grip strength is 4/5 on the right and 5 out of 5 on the left. Abduction and adduction and pincer grip were intact. Sensation to soft touch appears intact bilaterally. Radial pulses are 2+ bilaterally equal. The skin on both hands appears normal without rash. She has no thenar atrophy on the right.    Assessment & Plan:  #1. Shoulder pain with what appears to be a decent capsulitis. For this I would continue exercises and she's made some improvement already particularly with internal rotation. #2. I'm a little bit more depressed today by weakness on the right grip strength. It makes you wonder she has something else going on possibly from the neck they can also be contributing to her right shoulder pain. Given her underlying diagnosis of idiopathic peripheral neuropathy should not necessarily decrease her grip strength. I think we'll start with getting a cervical MRI and we'll see her back after that.

## 2013-03-29 ENCOUNTER — Telehealth: Payer: Self-pay | Admitting: Family Medicine

## 2013-03-29 NOTE — Telephone Encounter (Signed)
Left message with pt's daughter to ask pt to return my call.

## 2013-03-29 NOTE — Telephone Encounter (Signed)
Kayla [name] neck MRI shows a fair amount of degenerative changes--this could certainly be causing the symptoms in [name] rigt arm and hand of weakness and numbness. I would recommend evaluation by a neurosurgeon but she has no insurance---so I would recommend she get some insurance through the affordable care act if she can because at some point she may REALLy need surgery. UUnfortunaley until then I have nothing different to offer for the issues above. I woill continue to follow [name] for frozens houlder if she likes. THANKS! Dorcas Mcmurray

## 2013-03-29 NOTE — Telephone Encounter (Signed)
Spoke with pt- gave her message from Dr. Nori Riis.  Will send referral to [facility] to see if there are any neurosurgeons available.  Pt expressed understanding about looking into insurance through affordable care act.

## 2013-04-08 ENCOUNTER — Telehealth: Payer: Self-pay | Admitting: *Deleted

## 2013-04-08 ENCOUNTER — Other Ambulatory Visit: Payer: Self-pay

## 2013-04-08 NOTE — Telephone Encounter (Signed)
Call from pt would like to have Dr. Michail Sermon call her about the results of her MRI and visit with [facility].  Message to be sent to Dr.  Michail Sermon to call patient at home.  Wacey Zieger.  04/08/2013 2:21 PM

## 2013-04-09 NOTE — Telephone Encounter (Signed)
The MRI was ordered by Dr. Nori Riis of  [facility]. I can certainly review the results with her at her clinic visit on Monday but she would be better served by contacting Dr. Nori Riis and discussing a plan given the findings.

## 2013-04-14 ENCOUNTER — Telehealth: Payer: Self-pay | Admitting: *Deleted

## 2013-04-14 NOTE — Telephone Encounter (Signed)
Spoke with pt- advised her we received fax back stating [facility] does not have any neurosurgeons that participate with orange card.  Advised her the best option is still for her to look into getting insurance through affordable care act, so she can get into neurosurgeon.  Dr. Nori Riis will still see her for frozen shoulder.

## 2013-04-20 ENCOUNTER — Encounter: Payer: Self-pay | Admitting: Physical Medicine & Rehabilitation

## 2013-04-20 ENCOUNTER — Encounter
Payer: No Typology Code available for payment source | Attending: Physical Medicine & Rehabilitation | Admitting: Physical Medicine & Rehabilitation

## 2013-04-20 VITALS — BP 156/73 | HR 98 | Resp 14 | Ht 62.0 in | Wt 180.0 lb

## 2013-04-20 DIAGNOSIS — E1149 Type 2 diabetes mellitus with other diabetic neurological complication: Secondary | ICD-10-CM

## 2013-04-20 DIAGNOSIS — F329 Major depressive disorder, single episode, unspecified: Secondary | ICD-10-CM

## 2013-04-20 DIAGNOSIS — E1142 Type 2 diabetes mellitus with diabetic polyneuropathy: Secondary | ICD-10-CM | POA: Insufficient documentation

## 2013-04-20 DIAGNOSIS — M797 Fibromyalgia: Secondary | ICD-10-CM

## 2013-04-20 DIAGNOSIS — IMO0001 Reserved for inherently not codable concepts without codable children: Secondary | ICD-10-CM

## 2013-04-20 DIAGNOSIS — E1140 Type 2 diabetes mellitus with diabetic neuropathy, unspecified: Secondary | ICD-10-CM

## 2013-04-20 DIAGNOSIS — E039 Hypothyroidism, unspecified: Secondary | ICD-10-CM | POA: Insufficient documentation

## 2013-04-20 DIAGNOSIS — E559 Vitamin D deficiency, unspecified: Secondary | ICD-10-CM | POA: Insufficient documentation

## 2013-04-20 NOTE — Progress Notes (Signed)
Subjective:    Patient ID: Kayla [name], female    DOB: [birth year], 52 y.o.   MRN: [id number]  HPI  Kayla [name] is back regarding her neuropathy and chronic pain. She feels that the increase in gabapentin helps her pain, but she's not taking it on a schedule and more often than not, uses it when her pain flares. She receives the rx thru the health department. She did not make contact with the neurontin patient assist program. She didn't fill the elavil as it wasn't covered by the health department.   Another doctor rx'ed her cymbalta, but again, there was no payor source for this.   More often than not her pain has been better. Today her pain is a "6" which she finds quite tolerable. Shes' sleeping fairly well.    Pain Inventory Average Pain 6 Pain Right Now 7 My pain is burning, stabbing, tingling and aching  In the last 24 hours, has pain interfered with the following? General activity 5 Relation with others 4 Enjoyment of life 7 What TIME of day is your pain at its worst? varies Sleep (in general) Fair  Pain is worse with: some activites Pain improves with: medication Relief from Meds: 0  Mobility walk without assistance how many minutes can you walk? 60 ability to climb steps?  yes do you drive?  yes Do you have any goals in this area?  no  Function not employed: date last employed 12/2011 Do you have any goals in this area?  no  Neuro/Psych weakness numbness tingling trouble walking depression  Prior Studies Any changes since last visit?  no  Physicians involved in your care Any changes since last visit?  no   Family History  Problem Relation Age of Onset  . Emphysema Mother   . Cancer Mother     unknown  . Hypertension Mother   . Cancer Father     pancreatic   . Diabetes Father   . Hypertension Father   . Down syndrome Brother   . Cancer Maternal Aunt     cancer  . Cancer Other     breast cancer aunt  . Stroke Sister   . Diabetes Sister   .  Colon cancer Neg Hx   . Stomach cancer Neg Hx    History   Social History  . Marital Status: Single    Spouse Name: N/A    Number of Children: N/A  . Years of Education: 12   Occupational History  .     Social History Main Topics  . Smoking status: Former Smoker -- 5 years    Types: Cigarettes    Quit date: 09/30/1990  . Smokeless tobacco: Never Used  . Alcohol Use: No     Comment: Former use, denies heavy use/dependence/abuse  . Drug Use: No  . Sexually Active: Not Currently   Other Topics Concern  . None   Social History Narrative   Lives in [place], alone. Looking for a job. Has a son in town who checks in on her.    Past Surgical History  Procedure Laterality Date  . Appendectomy  1986  . Cesarean section  1986, 1993   Past Medical History  Diagnosis Date  . ANA POSITIVE 02/09/2010  . Type 2 diabetes mellitus   . Hyperlipidemia   . Dizziness     Multiple falls at home  . Diabetic neuropathy     Of hands and feet  . Sickle cell trait   . Fibromyalgia   .  Hypothyroidism   . Deafness in right ear 2012    in the process of treatment  . Anal warts 11/09/2012   BP 156/73  Pulse 98  Resp 14  Ht 5\' 2"  (1.575 m)  Wt 180 lb (81.647 kg)  BMI 32.91 kg/m2  SpO2 98%     Review of Systems  Constitutional: Positive for appetite change and unexpected weight change.  Respiratory: Positive for cough.   Gastrointestinal: Positive for constipation.  Musculoskeletal: Positive for gait problem.  Neurological: Positive for weakness and numbness.  Psychiatric/Behavioral: Positive for dysphoric mood.  All other systems reviewed and are negative.       Objective:   Physical Exam  General: Alert and oriented x 3, No apparent distress  HEENT: Head is normocephalic, atraumatic, PERRLA, EOMI, sclera anicteric, oral mucosa pink and moist, dentition intact, ext ear canals clear,  Neck: Supple without JVD or lymphadenopathy  Heart: Reg rate and rhythm. No murmurs  rubs or gallops  Chest: CTA bilaterally without wheezes, rales, or rhonchi; no distress  Abdomen: Soft, non-tender, non-distended, bowel sounds positive.  Extremities: No clubbing, cyanosis, or edema. Pulses are 2+  Skin: Clean and intact without signs of breakdown  Neuro: Pt is cognitively appropriate with normal insight, memory, and awareness. Cranial nerves 2-12 are intact. Sensory exam is diminished in a stocking glove distribution to above the elbows in both arms and above the knees in both legs. Distal limbs are less allodynic. Reflexes are 1+ in all 4's. Fine motor coordination is intact. No tremors. Motor function is limited due to pain. She was unable to fully grip with the right hand. Overall aside where she was limited by pain/ortho issues her strength was 4/5.  Musculoskeletal: she is limited with right shoulder rom and has difficulty with simple ABduction, IR/ER of the shoulder was somewhat limited. She could assist her arm over her head but cannot get it there on its own. Posture was fair. She walks with antalgia on either leg.  Psych: Pt's affect is appropriate and she's in good spirits, pleasant   Assessment & Plan:   1. Type II DM with severe peripheral neuropathy  2. ?FMS  3. 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 Hypothyroidism  5. Depression    Plan:  1. She eeds to take her gabapentin on scheduled. We reviewed this today.  2. Fill elavil 10-20mg  for sleep, pain, and mood. She will see if she can get this filled at [facility]  3. Recheck endocrine labs today including a FULL thyroid panel. Also would like to check B12, vitamin d, etc. Recommended vitamin supplementation in the meantime  4. Consider a trial of effexor for neuropathic pain and depression 5. Follow up in 2 months. 25 minutes of face to face patient care time were spent during this visit. All questions were encouraged and answered.

## 2013-04-20 NOTE — Patient Instructions (Signed)
CALL THE GABAPENTIN PATIENT ASSISTANCE AGAIN TO SEE IF YOU CAN GET SOME FINANCIAL AID.  CALL ME WITH ANY PROBLEMS OR QUESTIONS [id number]).  HAVE A GOOD DAY

## 2013-04-21 LAB — TSH: TSH: 30.764 u[IU]/mL — ABNORMAL HIGH (ref 0.350–4.500)

## 2013-04-21 LAB — VITAMIN B12: Vitamin B-12: 659 pg/mL (ref 211–911)

## 2013-04-23 LAB — VITAMIN D 1,25 DIHYDROXY
Vitamin D 1, 25 (OH)2 Total: 46 pg/mL (ref 18–72)
Vitamin D2 1, 25 (OH)2: <8 pg/mL
Vitamin D3 1, 25 (OH)2: 46 pg/mL

## 2013-04-26 ENCOUNTER — Ambulatory Visit: Payer: No Typology Code available for payment source | Admitting: Family Medicine

## 2013-05-18 ENCOUNTER — Other Ambulatory Visit: Payer: Self-pay | Admitting: *Deleted

## 2013-05-18 DIAGNOSIS — G4700 Insomnia, unspecified: Secondary | ICD-10-CM

## 2013-05-18 MED ORDER — ZOLPIDEM TARTRATE 5 MG PO TABS: 5.0000 mg | ORAL_TABLET | Freq: Every evening | ORAL | Status: DC | PRN

## 2013-05-19 NOTE — Telephone Encounter (Signed)
Ambien rx called to [facility].

## 2013-06-03 ENCOUNTER — Ambulatory Visit (INDEPENDENT_AMBULATORY_CARE_PROVIDER_SITE_OTHER): Payer: No Typology Code available for payment source | Admitting: Internal Medicine

## 2013-06-03 ENCOUNTER — Encounter: Payer: Self-pay | Admitting: Internal Medicine

## 2013-06-03 VITALS — BP 130/66 | HR 87 | Temp 97.0°F | Wt 178.3 lb

## 2013-06-03 DIAGNOSIS — N760 Acute vaginitis: Secondary | ICD-10-CM | POA: Insufficient documentation

## 2013-06-03 DIAGNOSIS — R87619 Unspecified abnormal cytological findings in specimens from cervix uteri: Secondary | ICD-10-CM

## 2013-06-03 MED ORDER — FLUCONAZOLE 150 MG PO TABS: 150.0000 mg | ORAL_TABLET | Freq: Every day | ORAL | Status: DC

## 2013-06-03 MED ORDER — METRONIDAZOLE 500 MG PO TABS: 500.0000 mg | ORAL_TABLET | Freq: Two times a day (BID) | ORAL | Status: DC

## 2013-06-03 NOTE — Assessment & Plan Note (Signed)
Patient with vaginal odor and discharge. Pelvic exam revealed creamy white exudate. Wet prep, GC Chlamydia probe obtained, Pap smear completed. Wet prep come back positive for Gardnerella. Patient was already empirically prescribed Flagyl 500 mg twice a day for one week, fluconazole  150 mg once. Patient encouraged to followup if symptoms do not resolve in one week.

## 2013-06-03 NOTE — Patient Instructions (Signed)
1.  Take Flagyl 500mg  twice a day for 1 week. 2.  Take Diflucan 150mg  once.

## 2013-06-03 NOTE — Assessment & Plan Note (Signed)
Pap smear collected today 

## 2013-06-03 NOTE — Progress Notes (Addendum)
Subjective:   Patient ID: Kayla [name] female   DOB: [birth year] 52 y.o.   MRN: [id number]  HPI: Kayla [name] is a 52 y.o. female who presents for an acute visit. She reports that she has had a vaginal odor for about 2 weeks, and then one week ago she developed some vaginal discharge. She felt like it might be a yeast infection so she tried Monistat over-the-counter. She presents today because of vaginal discharge and smell have not gotten any better. She denies any pain in her pelvic region or abdomen, she denies any dysuria. She reports that she is sexually active, last episode 3 months ago did not use condom. She uses an implant for birth control and has irregular periods.     Past Medical History  Diagnosis Date  . ANA POSITIVE 02/09/2010  . Type 2 diabetes mellitus   . Hyperlipidemia   . Dizziness     Multiple falls at home  . Diabetic neuropathy     Of hands and feet  . Sickle cell trait   . Fibromyalgia   . Hypothyroidism   . Deafness in right ear 2012    in the process of treatment  . Anal warts 11/09/2012   Current Outpatient Prescriptions  Medication Sig Dispense Refill  . ACCU-CHEK FASTCLIX LANCETS MISC 1 each by Does not apply route 4 (four) times daily -  before meals and at bedtime.  100 each  11  . Blood Glucose Monitoring Suppl (ACCU-CHEK AVIVA PLUS) W/DEVICE KIT 1 kit by Does not apply route as directed.  1 kit  0  . Blood Glucose Monitoring Suppl (ACCU-CHEK NANO SMARTVIEW) W/DEVICE KIT 1 each by Does not apply route 4 (four) times daily -  before meals and at bedtime.  1 kit  0  . fluconazole (DIFLUCAN) 150 MG tablet Take 1 tablet (150 mg total) by mouth daily.  1 tablet  0  . gabapentin (NEURONTIN) 800 MG tablet Take 1 tablet (800 mg total) by mouth 4 (four) times daily - after meals and at bedtime.  120 tablet  4  . insulin aspart (NOVOLOG FLEXPEN) 100 UNIT/ML injection Inject 3-5 Units into the skin 3 (three) times daily before meals.  1 vial  6  . insulin  glargine (LANTUS SOLOSTAR) 100 UNIT/ML injection Inject 40 units at bedtime  15 mL  10  . levothyroxine (SYNTHROID) 100 MCG tablet Take 1 tablet (100 mcg total) by mouth daily before breakfast.  30 tablet  11  . metFORMIN (GLUCOPHAGE) 1000 MG tablet Take 1 tablet (1,000 mg total) by mouth 2 (two) times daily with a meal.  60 tablet  6  . metroNIDAZOLE (FLAGYL) 500 MG tablet Take 1 tablet (500 mg total) by mouth 2 (two) times daily.  14 tablet  0  . olmesartan (BENICAR) 40 MG tablet Take 1 tablet (40 mg total) by mouth daily.  30 tablet  12  . zolpidem (AMBIEN) 5 MG tablet Take 1 tablet (5 mg total) by mouth at bedtime as needed.  30 tablet  3   No current facility-administered medications for this visit.   Family History  Problem Relation Age of Onset  . Emphysema Mother   . Cancer Mother     unknown  . Hypertension Mother   . Cancer Father     pancreatic   . Diabetes Father   . Hypertension Father   . Down syndrome Brother   . Cancer Maternal Aunt     cancer  . Cancer  Other     breast cancer aunt  . Stroke Sister   . Diabetes Sister   . Colon cancer Neg Hx   . Stomach cancer Neg Hx    History   Social History  . Marital Status: Single    Spouse Name: N/A    Number of Children: N/A  . Years of Education: 12   Occupational History  .     Social History Main Topics  . Smoking status: Former Smoker -- 5 years    Types: Cigarettes    Quit date: 09/30/1990  . Smokeless tobacco: Never Used  . Alcohol Use: No     Comment: Former use, denies heavy use/dependence/abuse  . Drug Use: No  . Sexual Activity: Not Currently   Other Topics Concern  . None   Social History Narrative   Lives in [place], alone. Looking for a job. Has a son in town who checks in on her.    Review of Systems: Review of Systems  Constitutional: Negative for fever, chills, weight loss and malaise/fatigue.  Eyes: Negative for blurred vision.  Respiratory: Negative for cough and shortness of  breath.   Cardiovascular: Negative for chest pain and leg swelling.  Gastrointestinal: Positive for constipation (chronic). Negative for nausea, vomiting, abdominal pain, diarrhea, blood in stool and melena.  Genitourinary: Negative for dysuria, urgency, frequency, hematuria and flank pain.  Musculoskeletal: Negative for myalgias.  Skin: Negative for itching and rash.  Neurological: Negative for dizziness and headaches.    Objective:  Physical Exam: Filed Vitals:   06/03/13 0837  BP: 130/66  Pulse: 87  Temp: 97 F (36.1 C)  TempSrc: Oral  Weight: 178 lb 4.8 oz (80.876 kg)  SpO2: 97%   Physical Exam  Nursing note and vitals reviewed. Constitutional: She is well-developed, well-nourished, and in no distress. No distress.  HENT:  Head: Normocephalic and atraumatic.  Cardiovascular: Normal rate and regular rhythm.   No murmur heard. Pulmonary/Chest: Effort normal and breath sounds normal. No respiratory distress. She has no wheezes.  Abdominal: Soft. Bowel sounds are normal. She exhibits no distension. There is no tenderness.  Genitourinary: Cervix normal, right adnexa normal and left adnexa normal. Cervix exhibits no motion tenderness and no tenderness. Right adnexum displays no tenderness. Left adnexum displays no tenderness. Vulva exhibits exudate. Vagina exhibits exudate. Creamy  fishy  white and vaginal discharge found.  Addendum: Nurse Sander Nephew assisted  Skin: She is not diaphoretic.    Assessment & Plan:  See Problem Based Assessment and Plan

## 2013-06-07 NOTE — Progress Notes (Signed)
I saw and evaluated the patient.  I personally confirmed the key portions of the history and exam documented by Dr. Hoffman and I reviewed pertinent patient test results.  The assessment, diagnosis, and plan were formulated together and I agree with the documentation in the resident's note. 

## 2013-06-15 ENCOUNTER — Other Ambulatory Visit: Payer: Self-pay | Admitting: *Deleted

## 2013-06-15 DIAGNOSIS — E1140 Type 2 diabetes mellitus with diabetic neuropathy, unspecified: Secondary | ICD-10-CM

## 2013-06-16 MED ORDER — INSULIN ASPART 100 UNIT/ML ~~LOC~~ SOLN: 3.0000 [IU] | Freq: Three times a day (TID) | SUBCUTANEOUS | Status: DC

## 2013-06-16 NOTE — Telephone Encounter (Signed)
Called to pharm 

## 2013-06-21 ENCOUNTER — Encounter
Payer: No Typology Code available for payment source | Attending: Physical Medicine & Rehabilitation | Admitting: Physical Medicine & Rehabilitation

## 2013-06-21 DIAGNOSIS — E039 Hypothyroidism, unspecified: Secondary | ICD-10-CM | POA: Insufficient documentation

## 2013-06-21 DIAGNOSIS — E1149 Type 2 diabetes mellitus with other diabetic neurological complication: Secondary | ICD-10-CM | POA: Insufficient documentation

## 2013-06-21 DIAGNOSIS — E1142 Type 2 diabetes mellitus with diabetic polyneuropathy: Secondary | ICD-10-CM | POA: Insufficient documentation

## 2013-07-05 ENCOUNTER — Encounter: Payer: No Typology Code available for payment source | Admitting: Internal Medicine

## 2013-07-08 ENCOUNTER — Ambulatory Visit (INDEPENDENT_AMBULATORY_CARE_PROVIDER_SITE_OTHER): Payer: No Typology Code available for payment source | Admitting: Internal Medicine

## 2013-07-08 ENCOUNTER — Encounter: Payer: Self-pay | Admitting: Internal Medicine

## 2013-07-08 VITALS — BP 136/75 | HR 82 | Temp 98.0°F | Ht 62.0 in | Wt 177.9 lb

## 2013-07-08 DIAGNOSIS — N898 Other specified noninflammatory disorders of vagina: Secondary | ICD-10-CM | POA: Insufficient documentation

## 2013-07-08 DIAGNOSIS — E1142 Type 2 diabetes mellitus with diabetic polyneuropathy: Secondary | ICD-10-CM

## 2013-07-08 DIAGNOSIS — E039 Hypothyroidism, unspecified: Secondary | ICD-10-CM

## 2013-07-08 DIAGNOSIS — E1149 Type 2 diabetes mellitus with other diabetic neurological complication: Secondary | ICD-10-CM

## 2013-07-08 DIAGNOSIS — E1140 Type 2 diabetes mellitus with diabetic neuropathy, unspecified: Secondary | ICD-10-CM

## 2013-07-08 LAB — POCT GLYCOSYLATED HEMOGLOBIN (HGB A1C): Hemoglobin A1C: 7.8

## 2013-07-08 LAB — GLUCOSE, CAPILLARY: Glucose-Capillary: 310 mg/dL — ABNORMAL HIGH (ref 70–99)

## 2013-07-08 MED ORDER — INSULIN GLARGINE 100 UNIT/ML ~~LOC~~ SOLN: SUBCUTANEOUS | Status: DC

## 2013-07-08 NOTE — Assessment & Plan Note (Signed)
Patient reported cyst can not be appreciated on physical exam. UTI not suspected due to lack of urinary symptoms.  Plan  Reassured the patient and advised that if symptoms persist for more than another 10 days to call clinic for reassessment.

## 2013-07-08 NOTE — Assessment & Plan Note (Signed)
Lab Results  Component Value Date   HGBA1C 7.8 07/08/2013   HGBA1C 5.9 02/15/2013   HGBA1C 11.8 11/23/2012     Assessment: Diabetes control: fair control Progress toward A1C goal:  deteriorated Comments: no meter today, she reports that she does not take the novolog as prescribed. CBG 300 today.  Plan: Medications:  Increased Lantus from 40 to 45 units qhs Home glucose monitoring: Frequency: 3 times a day Timing: before breakfast;before lunch;before dinner Instruction/counseling given: reminded to bring blood glucose meter & log to each visit Educational resources provided:   Self management tools provided:   Other plans: declined referral to [facility] Will follow up with PCP in one month  Advised to bring her meter on next visit

## 2013-07-08 NOTE — Patient Instructions (Addendum)
General Instructions: Ensure good personal hygiene If you develop pain or fevers or increased vaginal irritation, please come back to clinic  I have increased your lantus to 45 units at bedtime Please bring you glucose meter on next visit     Treatment Goals:  Goals (1 Years of Data) as of 07/08/13         As of Today 06/03/13 04/20/13 03/22/13 03/10/13     Blood Pressure    . Blood Pressure < 140/90  136/75 130/66 156/73 151/79 144/85    . Blood Pressure < 140/90  136/75 130/66 156/73 151/79 144/85     Lifestyle    . Prevent Falls           Result Component    . HEMOGLOBIN A1C < 7.0  7.8        . LDL CALC < 100            Progress Toward Treatment Goals:  Treatment Goal 07/08/2013  Hemoglobin A1C deteriorated  Blood pressure at goal  Prevent falls -    Self Care Goals & Plans:  Self Care Goal 07/08/2013  Manage my medications take my medicines as prescribed; bring my medications to every visit; refill my medications on time; follow the sick day instructions if I am sick  Monitor my health -  Eat healthy foods -    Home Blood Glucose Monitoring 07/08/2013  Check my blood sugar 3 times a day  When to check my blood sugar before breakfast; before lunch; before dinner     Care Management & Community Referrals:  Referral 07/08/2013  Referrals made for care management support diabetes educator  Referrals made to community resources -

## 2013-07-08 NOTE — Progress Notes (Signed)
Patient ID: Kayla [name], female   DOB: May 01, 1961, 52 y.o.   MRN: [id number]   Subjective:   HPI: Kayla [name] is a 52 y.o. with past medical history of diabetes, hyperlipidemia, fibromyalgia, hypothyroidism presents to the clinic with what she describes as a "vagina cyst" for 12 days.   It feels uncomfortable but without overt pain. She is unsure whether it's increasing in size or not. She denies urinary symptoms. No increased vagina discharge or bleeding. No fevers, chills, rigors, or increased fatigue. She has no flank pain. No abdominal pain. During her visit on 06/05/2013, she was found to have vaginitis and treated with flagyl and fluconazole. Her previous symptoms significantly improved since completing treatment.  Past Medical History  Diagnosis Date  . ANA POSITIVE 02/09/2010  . Type 2 diabetes mellitus   . Hyperlipidemia   . Dizziness     Multiple falls at home  . Diabetic neuropathy     Of hands and feet  . Sickle cell trait   . Fibromyalgia   . Hypothyroidism   . Deafness in right ear 2012    in the process of treatment  . Anal warts 11/09/2012   Current Outpatient Prescriptions  Medication Sig Dispense Refill  . ACCU-CHEK FASTCLIX LANCETS MISC 1 each by Does not apply route 4 (four) times daily -  before meals and at bedtime.  100 each  11  . Blood Glucose Monitoring Suppl (ACCU-CHEK AVIVA PLUS) W/DEVICE KIT 1 kit by Does not apply route as directed.  1 kit  0  . Blood Glucose Monitoring Suppl (ACCU-CHEK NANO SMARTVIEW) W/DEVICE KIT 1 each by Does not apply route 4 (four) times daily -  before meals and at bedtime.  1 kit  0  . DULoxetine (CYMBALTA) 30 MG capsule Take 60 mg by mouth daily.      . fluconazole (DIFLUCAN) 150 MG tablet Take 1 tablet (150 mg total) by mouth daily.  1 tablet  0  . gabapentin (NEURONTIN) 800 MG tablet Take 1 tablet (800 mg total) by mouth 4 (four) times daily - after meals and at bedtime.  120 tablet  4  . insulin aspart (NOVOLOG FLEXPEN) 100  UNIT/ML injection Inject 3-5 Units into the skin 3 (three) times daily before meals.  3 vial  4  . insulin glargine (LANTUS) 100 UNIT/ML injection Inject 45 units at bedtime  15 mL  10  . levothyroxine (SYNTHROID) 100 MCG tablet Take 1 tablet (100 mcg total) by mouth daily before breakfast.  30 tablet  11  . metFORMIN (GLUCOPHAGE) 1000 MG tablet Take 1 tablet (1,000 mg total) by mouth 2 (two) times daily with a meal.  60 tablet  6  . metroNIDAZOLE (FLAGYL) 500 MG tablet Take 1 tablet (500 mg total) by mouth 2 (two) times daily.  14 tablet  0  . olmesartan (BENICAR) 40 MG tablet Take 1 tablet (40 mg total) by mouth daily.  30 tablet  12  . zolpidem (AMBIEN) 5 MG tablet Take 1 tablet (5 mg total) by mouth at bedtime as needed.  30 tablet  3   No current facility-administered medications for this visit.   Family History  Problem Relation Age of Onset  . Emphysema Mother   . Cancer Mother     unknown  . Hypertension Mother   . Cancer Father     pancreatic   . Diabetes Father   . Hypertension Father   . Down syndrome Brother   . Cancer Maternal Aunt  cancer  . Cancer Other     breast cancer aunt  . Stroke Sister   . Diabetes Sister   . Colon cancer Neg Hx   . Stomach cancer Neg Hx    History   Social History  . Marital Status: Single    Spouse Name: N/A    Number of Children: N/A  . Years of Education: 12   Occupational History  .     Social History Main Topics  . Smoking status: Former Smoker -- 5 years    Types: Cigarettes    Quit date: 09/30/1990  . Smokeless tobacco: Never Used  . Alcohol Use: No  . Drug Use: No  . Sexual Activity: None   Other Topics Concern  . None   Social History Narrative   Lives in [place], alone. Looking for a job. Has a son in town who checks in on her.    Review of Systems: Constitutional: Denies fever, chills, diaphoresis, appetite change and fatigue.  Respiratory: Denies SOB, DOE, cough, chest tightness, and wheezing. Denies  chest pain. Cardiovascular: No chest pain, palpitations and leg swelling.  Gastrointestinal: No abdominal pain, nausea, vomiting, bloody stools Genitourinary: No dysuria, frequency, hematuria, or flank pain.  Musculoskeletal: No myalgias, back pain, joint swelling, arthralgias  Psych: No depression symptoms. No SI or SA.   Objective:  Physical Exam: Filed Vitals:   07/08/13 0935  BP: 136/75  Pulse: 82  Temp: 98 F (36.7 C)  TempSrc: Oral  Height: 5\' 2"  (1.575 m)  Weight: 177 lb 14.4 oz (80.695 kg)  SpO2: 97%   General: Well nourished. No acute distress.  HEENT: Normal oral mucosa. MMM.  Lungs: CTA bilaterally. Heart: RRR; no extra sounds or murmurs  Abdomen: Non-distended, normal BS, soft, nontender; no hepatosplenomegaly. Extremities: No pedal edema. No joint swelling or tenderness. Genital: vulva and vagina appear normal externally. No increased discharge. The area on the left labia majora where she points to have a 'vagina cyst' does not look swollen or tender. No signs of inflammation of the vaginal wall. Neurologic: Normal EOM,  Alert and oriented x3. No obvious neurologic/cranial nerve deficits.  Assessment & Plan:  I have discussed my assessment and plan  with  my attending in the clinic, Dr. Lynnae January  as detailed under problem based charting.

## 2013-07-11 NOTE — Progress Notes (Signed)
Case discussed with Dr.Kazibwe at the time of the visit.  We reviewed the resident's history and exam and pertinent patient test results.  I agree with the assessment, diagnosis, and plan of care documented in the resident's note.    

## 2013-07-21 ENCOUNTER — Telehealth: Payer: Self-pay | Admitting: *Deleted

## 2013-07-21 NOTE — Telephone Encounter (Signed)
Call from pt stating that she has a cough.  When asked if she was coughing up anything she replied no.  Pt said that she does not have a fever.  Cough is affecting her work.  Pt was offered an appointment for today ,staed she would like to come tomorrow.  Appointment scheduled for 10/23/204 9:14 AM.  Sander Nephew, RN 07/21/2013 11:36 AM.

## 2013-07-22 ENCOUNTER — Ambulatory Visit (INDEPENDENT_AMBULATORY_CARE_PROVIDER_SITE_OTHER): Payer: No Typology Code available for payment source | Admitting: Internal Medicine

## 2013-07-22 ENCOUNTER — Encounter: Payer: Self-pay | Admitting: Internal Medicine

## 2013-07-22 VITALS — BP 133/78 | HR 94 | Temp 97.8°F | Wt 176.0 lb

## 2013-07-22 DIAGNOSIS — N760 Acute vaginitis: Secondary | ICD-10-CM

## 2013-07-22 DIAGNOSIS — N390 Urinary tract infection, site not specified: Secondary | ICD-10-CM

## 2013-07-22 DIAGNOSIS — J209 Acute bronchitis, unspecified: Secondary | ICD-10-CM

## 2013-07-22 MED ORDER — METRONIDAZOLE 500 MG PO TABS: 500.0000 mg | ORAL_TABLET | Freq: Two times a day (BID) | ORAL | Status: DC

## 2013-07-22 MED ORDER — CEPHALEXIN 500 MG PO CAPS: 500.0000 mg | ORAL_CAPSULE | Freq: Two times a day (BID) | ORAL | Status: DC

## 2013-07-22 MED ORDER — FLUCONAZOLE 150 MG PO TABS: 150.0000 mg | ORAL_TABLET | Freq: Every day | ORAL | Status: DC

## 2013-07-22 MED ORDER — BENZONATATE 200 MG PO CAPS: 200.0000 mg | ORAL_CAPSULE | Freq: Three times a day (TID) | ORAL | Status: DC | PRN

## 2013-07-22 NOTE — Assessment & Plan Note (Addendum)
Assessment: Patient's symptoms are consistent with acute bronchitis. Most cases of acute bronchitis are due to viruses and antibiotics are not indicated. Absence of fever makes influenza or pneumonia unlikely. CXR is not indicated since pt has no abnormal vital signs (pulse >100/minute, respiratory rate >24, or temperature >38C), rales or signs of consolidation on chest examination, evidence of hypoxemia (eg, pulse oxygen assessment), mental confusion, or signs of systemic illness.  Plan: 1. Symptomatic treatment with Tessalon. 2. Educated the patient that antibiotics are not indicated in this case. (However, she will be treated with Keflex for her UTI) 3. Encouraged the patient to take plenty of fluids. 4. Informed the patient that in acute bronchitis cough generally persists two to three weeks, and airway hyperreactivity may last five to six weeks. 5. Advised the patient to come to the clinic if temp >100F, increased short of breath, chest pain, in case of hemoptysis, or if symptoms present over the next several days.

## 2013-07-22 NOTE — Progress Notes (Signed)
Patient ID: Kayla [name], female   DOB: Feb 21, 1961, 52 y.o.   MRN: [id number]  Subjective:   HPI: [name] is a 52 y.o. with past medical history of diabetes, hyperlipidemia, fibromyalgia, hypothyroidism presents to the clinic with  a dry cough for 3 weeks.  1) Cough: She denies chest pain, shortness of breath, fevers or chills. She reports that she has "recurrent bronchitis." and she feels that this is an acute flare of her bronchitis. She denies pleuritic chest pain. No wheezing. No sore throat, ear ache or drainage. No post nasal drip. She has not been in contact with persons with similar symptoms. She is a former smoker. 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  2)Vaginal discharge and UTI:   Patient reports recurrent vaginal infection. She reports that she feels she has an episode of the same currently. It started a few days ago with increased vaginal discharge. She also reports increased strong smell of her urine. She denies other urinary symptoms, like, dysuria, or increased urinary frequency, or hematuria. She doesn't have flank abdominal pain. Urine was noted to be turbid. Urine dipstick was positive for nitrites.   Past Medical History  Diagnosis Date  . ANA POSITIVE 02/09/2010  . Type 2 diabetes mellitus   . Hyperlipidemia   . Dizziness     Multiple falls at home  . Diabetic neuropathy     Of hands and feet  . Sickle cell trait   . Fibromyalgia   . Hypothyroidism   . Deafness in right ear 2012    in the process of treatment  . Anal warts 11/09/2012   Current Outpatient Prescriptions  Medication Sig Dispense Refill  . ACCU-CHEK FASTCLIX LANCETS MISC 1 each by Does not apply route 4 (four) times daily -  before meals and at bedtime.  100 each  11  . benzonatate (TESSALON) 200 MG capsule Take 1 capsule (200 mg total) by mouth 3 (three) times  daily as needed for cough.  42 capsule  0  . Blood Glucose Monitoring Suppl (ACCU-CHEK AVIVA PLUS) W/DEVICE KIT 1 kit by Does not apply route as directed.  1 kit  0  . Blood Glucose Monitoring Suppl (ACCU-CHEK NANO SMARTVIEW) W/DEVICE KIT 1 each by Does not apply route 4 (four) times daily -  before meals and at bedtime.  1 kit  0  . cephALEXin (KEFLEX) 500 MG capsule Take 1 capsule (500 mg total) by mouth 2 (two) times daily.  14 capsule  0  . DULoxetine (CYMBALTA) 30 MG capsule Take 60 mg by mouth daily.      . fluconazole (DIFLUCAN) 150 MG tablet Take 1 tablet (150 mg total) by mouth daily.  1 tablet  0  . gabapentin (NEURONTIN) 800 MG tablet Take 1 tablet (800 mg total) by mouth 4 (four) times daily - after meals and at bedtime.  120 tablet  4  . insulin aspart (NOVOLOG FLEXPEN) 100 UNIT/ML injection Inject 3-5 Units into the skin 3 (three) times daily before meals.  3 vial  4  . insulin glargine (LANTUS) 100 UNIT/ML injection Inject 45 units at bedtime  15 mL  10  . levothyroxine (SYNTHROID) 100 MCG tablet Take 1 tablet (100 mcg total) by mouth daily before breakfast.  30 tablet  11  . metFORMIN (GLUCOPHAGE) 1000 MG tablet Take 1 tablet (1,000 mg total) by  mouth 2 (two) times daily with a meal.  60 tablet  6  . metroNIDAZOLE (FLAGYL) 500 MG tablet Take 1 tablet (500 mg total) by mouth 2 (two) times daily.  14 tablet  0  . olmesartan (BENICAR) 40 MG tablet Take 1 tablet (40 mg total) by mouth daily.  30 tablet  12  . zolpidem (AMBIEN) 5 MG tablet Take 1 tablet (5 mg total) by mouth at bedtime as needed.  30 tablet  3   No current facility-administered medications for this visit.   Family History  Problem Relation Age of Onset  . Emphysema Mother   . Cancer Mother     unknown  . Hypertension Mother   . Cancer Father     pancreatic   . Diabetes Father   . Hypertension Father   . Down syndrome Brother   . Cancer Maternal Aunt     cancer  . Cancer Other     breast cancer aunt  .  Stroke Sister   . Diabetes Sister   . Colon cancer Neg Hx   . Stomach cancer Neg Hx    History   Social History  . Marital Status: Single    Spouse Name: N/A    Number of Children: N/A  . Years of Education: 12   Occupational History  .     Social History Main Topics  . Smoking status: Former Smoker -- 5 years    Types: Cigarettes    Quit date: 09/30/1990  . Smokeless tobacco: Never Used  . Alcohol Use: No  . Drug Use: No  . Sexual Activity: None   Other Topics Concern  . None   Social History Narrative   Lives in [place], alone. Looking for a job. Has a son in town who checks in on her.    Review of Systems: Constitutional: Denies fever, chills, diaphoresis, appetite change and fatigue.  Cardiovascular: No chest pain, palpitations and leg swelling.  Gastrointestinal: No abdominal pain, nausea, vomiting, bloody stools Musculoskeletal: No myalgias, back pain, joint swelling, arthralgias  Psych: No depression symptoms. No SI or SA.   Objective:  Physical Exam: Filed Vitals:   07/22/13 0923  BP: 133/78  Pulse: 94  Temp: 97.8 F (36.6 C)  TempSrc: Oral  Weight: 176 lb (79.833 kg)  SpO2: 98%   General: coughs repeatedly in the room.  Well nourished. No acute distress.  HEENT: Normal oral mucosa. MMM.  Lungs: CTA bilaterally. Heart: RRR; no extra sounds or murmurs  Abdomen: Non-distended, normal BS, soft, nontender; no hepatosplenomegaly. Pelvic exam: deferred  Extremities: No pedal edema. No joint swelling or tenderness.  Neurologic: Normal EOM,  Alert and oriented x3. No obvious neurologic/cranial nerve deficits.  Assessment & Plan:  I have discussed my assessment and plan  with  my attending in the clinic, Dr. Eppie Gibson  as detailed under problem based charting.

## 2013-07-22 NOTE — Assessment & Plan Note (Signed)
Patient's presentation with increased flow smell of her urine and a urine dipstick with positive nitrites is consistent with a UTI. It is non-complicated UTI.  Plan  - Keflex 500 mg bid for 7 days.  - follow up if symptoms persist.

## 2013-07-22 NOTE — Patient Instructions (Signed)
Please take your Keflex 500 mg twice a day for 7 days for your urine infection  Please take tessalon for your cough  Please take Diflucan and Flagyl for your vaginal infection  Please come back if you have fevers, shortness of breath

## 2013-07-22 NOTE — Assessment & Plan Note (Signed)
Patient reports recurrent vaginal infection. Last month she received treatment with improvement in symptoms. Pelvic exam and more testing not repeated today. Will treat her for both BV and yeast vaginal infection with metronidazole and Diflucan.

## 2013-07-26 NOTE — Progress Notes (Signed)
Case discussed with Dr. Kazibwe at the time of the visit.  We reviewed the resident's history and exam and pertinent patient test results.  I agree with the assessment, diagnosis and plan of care documented in the resident's note. 

## 2013-08-09 ENCOUNTER — Telehealth: Payer: Self-pay | Admitting: *Deleted

## 2013-08-09 ENCOUNTER — Encounter: Payer: Self-pay | Admitting: Internal Medicine

## 2013-08-09 ENCOUNTER — Ambulatory Visit (INDEPENDENT_AMBULATORY_CARE_PROVIDER_SITE_OTHER): Payer: Self-pay | Admitting: Internal Medicine

## 2013-08-09 VITALS — BP 143/73 | HR 68 | Temp 98.6°F | Ht 62.0 in | Wt 175.6 lb

## 2013-08-09 DIAGNOSIS — E1142 Type 2 diabetes mellitus with diabetic polyneuropathy: Secondary | ICD-10-CM

## 2013-08-09 DIAGNOSIS — I10 Essential (primary) hypertension: Secondary | ICD-10-CM

## 2013-08-09 DIAGNOSIS — E039 Hypothyroidism, unspecified: Secondary | ICD-10-CM

## 2013-08-09 DIAGNOSIS — F329 Major depressive disorder, single episode, unspecified: Secondary | ICD-10-CM

## 2013-08-09 DIAGNOSIS — Z9114 Patient's other noncompliance with medication regimen: Secondary | ICD-10-CM

## 2013-08-09 DIAGNOSIS — M797 Fibromyalgia: Secondary | ICD-10-CM

## 2013-08-09 DIAGNOSIS — M7501 Adhesive capsulitis of right shoulder: Secondary | ICD-10-CM

## 2013-08-09 DIAGNOSIS — J209 Acute bronchitis, unspecified: Secondary | ICD-10-CM

## 2013-08-09 DIAGNOSIS — G4700 Insomnia, unspecified: Secondary | ICD-10-CM

## 2013-08-09 DIAGNOSIS — E1140 Type 2 diabetes mellitus with diabetic neuropathy, unspecified: Secondary | ICD-10-CM

## 2013-08-09 DIAGNOSIS — E1149 Type 2 diabetes mellitus with other diabetic neurological complication: Secondary | ICD-10-CM

## 2013-08-09 LAB — GLUCOSE, CAPILLARY: Glucose-Capillary: 123 mg/dL — ABNORMAL HIGH (ref 70–99)

## 2013-08-09 LAB — TSH: TSH: 15.961 u[IU]/mL — ABNORMAL HIGH (ref 0.350–4.500)

## 2013-08-09 MED ORDER — LEVOTHYROXINE SODIUM 100 MCG PO TABS: 100.0000 ug | ORAL_TABLET | Freq: Every day | ORAL | Status: DC

## 2013-08-09 MED ORDER — INSULIN GLARGINE 100 UNIT/ML ~~LOC~~ SOLN: SUBCUTANEOUS | Status: DC

## 2013-08-09 MED ORDER — GABAPENTIN 800 MG PO TABS: 800.0000 mg | ORAL_TABLET | Freq: Three times a day (TID) | ORAL | Status: DC

## 2013-08-09 MED ORDER — DULOXETINE HCL 30 MG PO CPEP: 60.0000 mg | ORAL_CAPSULE | Freq: Every day | ORAL | Status: DC

## 2013-08-09 MED ORDER — INSULIN ASPART 100 UNIT/ML ~~LOC~~ SOLN: 3.0000 [IU] | Freq: Three times a day (TID) | SUBCUTANEOUS | Status: DC

## 2013-08-09 MED ORDER — ZOLPIDEM TARTRATE 5 MG PO TABS: 5.0000 mg | ORAL_TABLET | Freq: Every evening | ORAL | Status: DC | PRN

## 2013-08-09 NOTE — Progress Notes (Signed)
  Subjective:    Patient ID: Kayla [name], female    DOB: March 29, 1961, 52 y.o.   MRN: [id number]  HPI  Hx significant for Diabetes Mellitus Type 2 with neuropathy, hypothyroidism, hypertension, insomnia, depression, and recent cough and poor compliance with medications. Presents for refill of medication. States that she has not been taking her blood pressure medication for past 3 weeks thinking that it may have been causing a cough but now believes it was the dust from her temporary job in a [place]. Has not increased her Lantus to 45 units in the evening because she states her HgbA1c was only elevated bc she hadnt been eating right and had not been using meal time coverage insulin.  States that she is not as stress as before and has been following with [facility] for her depression which is doing well on cymbalta per pt report.  Review of Systems  Constitutional: Negative for fever.  HENT: Negative for congestion.   Respiratory: Positive for cough. Negative for shortness of breath.   Cardiovascular: Negative for chest pain, palpitations and leg swelling.  Gastrointestinal: Negative for constipation.  Genitourinary: Negative for dysuria.  Musculoskeletal: Positive for myalgias.  Skin: Negative for rash.  Neurological: Negative for weakness, light-headedness and headaches.  Psychiatric/Behavioral: Negative for dysphoric mood.       Objective:   Physical Exam  Constitutional: She is oriented to person, place, and time. She appears well-developed and well-nourished. No distress.  HENT:  Head: Normocephalic and atraumatic.  Eyes: Conjunctivae and EOM are normal. Pupils are equal, round, and reactive to light.  Neck: Neck supple. No thyromegaly present.  Cardiovascular: Normal rate, regular rhythm and normal heart sounds.   No murmur heard. Pulmonary/Chest: Effort normal and breath sounds normal.  Abdominal: Soft. Bowel sounds are normal.  Musculoskeletal: Normal range of motion. She exhibits  no edema.  Neurological: She is alert and oriented to person, place, and time.  Skin: Skin is warm and dry. No erythema.  Psychiatric: She has a normal mood and affect.          Assessment & Plan:  See separate problem list charting:  #1 DM Type 2, uncontrolled with neuropathy: last HgbA1c above goal at ~7, secondary to poor compliance -have refilled Lantus, pt will cont to take 40 units qhs but agress to increase if HgbA1c still elevated at next f/u -refilled Novolog pen for meal coverage 3-5 units qac -pt does not take the metformin (only when she runs out of insulin)--> will remove from med list -refilled gabapentin 800 mg tid (changed from qid)  #2 hypertension: above goal -resume olmersartan (benicar) 40 mg qd, refilled today  #3 hypothyroidism: last TSH >30, pt reports recent compliance with synthroid -check TSH today  #4 depression: stable on cymbalta -refilled today -f/u with [facility]

## 2013-08-09 NOTE — Assessment & Plan Note (Signed)
Lab Results  Component Value Date   HGBA1C 7.8 07/08/2013   HGBA1C 5.9 02/15/2013   HGBA1C 11.8 11/23/2012     Assessment: Diabetes control: fair control Progress toward A1C goal:  deteriorated Comments: has not been compliant with 45 units Lantus, and has not been using Novolog pen with meals, does not monitor with glucometer  Plan: Medications:  continue current medications Home glucose monitoring: Frequency:   Timing:   Instruction/counseling given: reminded to bring medications to each visit Educational resources provided:   Self management tools provided:   Other plans: Will cont Lantus 40 units qhs as pt will not increase to 45 units, states that she has not been eating right and hadnt been couldn't get Novolog pen thus doesn't need Lantus increase as prescribed on prior viist

## 2013-08-09 NOTE — Assessment & Plan Note (Signed)
BP Readings from Last 3 Encounters:  08/09/13 143/73  07/22/13 133/78  07/08/13 136/75    Lab Results  Component Value Date   NA 136 08/24/2012   K 3.9 08/24/2012   CREATININE 1.13* 08/24/2012    Assessment: Blood pressure control: mildly elevated Progress toward BP goal:  deteriorated Comments: hasnt taken Benicar for ~3 weeks  Plan: Medications:  continue current medications Educational resources provided:   Self management tools provided:   Other plans: pt will resume Benicar as she no longer thinks it was causing her cough

## 2013-08-09 NOTE — Assessment & Plan Note (Signed)
States hasnt sleep well in 10 years due to stress -requesting refill on ambien -advised to cont 5 mg qhs

## 2013-08-09 NOTE — Patient Instructions (Signed)
General Instructions: We have called in refills of several medications to the health department. We will check your TSH today. Take your insulin and blood pressure medication as prescribed. Follow-up with me in 3-4 months.   Treatment Goals:  Goals (1 Years of Data) as of 08/09/13         As of Today 07/22/13 07/08/13 06/03/13 04/20/13     Blood Pressure    . Blood Pressure < 140/90  143/73 133/78 136/75 130/66 156/73    . Blood Pressure < 140/90  143/73 133/78 136/75 130/66 156/73     Lifestyle    . Prevent Falls           Result Component    . HEMOGLOBIN A1C < 7.0    7.8      . LDL CALC < 100            Progress Toward Treatment Goals:  Treatment Goal 08/09/2013  Hemoglobin A1C deteriorated  Blood pressure deteriorated  Prevent falls -    Self Care Goals & Plans:  Self Care Goal 07/08/2013  Manage my medications take my medicines as prescribed; bring my medications to every visit; refill my medications on time; follow the sick day instructions if I am sick  Monitor my health -  Eat healthy foods -    Home Blood Glucose Monitoring 07/08/2013  Check my blood sugar 3 times a day  When to check my blood sugar before breakfast; before lunch; before dinner     Care Management & Community Referrals:  Referral 07/08/2013  Referrals made for care management support diabetes educator  Referrals made to community resources -

## 2013-08-09 NOTE — Telephone Encounter (Signed)
done

## 2013-08-09 NOTE — Assessment & Plan Note (Signed)
Stable on cymbalta, states stressors are less -followed by [facility]

## 2013-08-09 NOTE — Assessment & Plan Note (Signed)
Tsh check today -synthroid refilled

## 2013-08-11 NOTE — Progress Notes (Signed)
Case discussed with Dr. Michail Sermon soon after the resident saw the patient.  We reviewed the resident's history and exam and pertinent patient test results.  I agree with the assessment, diagnosis and plan of care documented in the resident's note.

## 2013-09-06 ENCOUNTER — Telehealth: Payer: Self-pay | Admitting: *Deleted

## 2013-09-06 NOTE — Telephone Encounter (Signed)
Fax from [facility] - they no longer have Neurontin 800mg  at no cost to the pt. They do have 300mg  available -if u consider changing to 300mg  2 caps QID; if so, they will  need new rx. Thanks

## 2013-09-09 ENCOUNTER — Other Ambulatory Visit: Payer: Self-pay | Admitting: Internal Medicine

## 2013-09-09 MED ORDER — GABAPENTIN 300 MG PO CAPS: 600.0000 mg | ORAL_CAPSULE | Freq: Four times a day (QID) | ORAL | Status: DC

## 2013-09-10 NOTE — Telephone Encounter (Signed)
Done - see encounter 12/11.

## 2013-12-08 ENCOUNTER — Encounter: Payer: Self-pay | Admitting: Internal Medicine

## 2013-12-21 ENCOUNTER — Encounter: Payer: Self-pay | Admitting: Internal Medicine

## 2014-02-23 ENCOUNTER — Encounter: Payer: Self-pay | Admitting: Internal Medicine

## 2014-02-23 ENCOUNTER — Ambulatory Visit (INDEPENDENT_AMBULATORY_CARE_PROVIDER_SITE_OTHER): Payer: Self-pay | Admitting: Internal Medicine

## 2014-02-23 VITALS — BP 155/77 | HR 71 | Temp 97.7°F | Ht 62.0 in | Wt 181.7 lb

## 2014-02-23 DIAGNOSIS — E1140 Type 2 diabetes mellitus with diabetic neuropathy, unspecified: Secondary | ICD-10-CM

## 2014-02-23 DIAGNOSIS — B302 Viral pharyngoconjunctivitis: Secondary | ICD-10-CM | POA: Insufficient documentation

## 2014-02-23 DIAGNOSIS — E1165 Type 2 diabetes mellitus with hyperglycemia: Secondary | ICD-10-CM

## 2014-02-23 DIAGNOSIS — E785 Hyperlipidemia, unspecified: Secondary | ICD-10-CM

## 2014-02-23 DIAGNOSIS — IMO0002 Reserved for concepts with insufficient information to code with codable children: Secondary | ICD-10-CM

## 2014-02-23 DIAGNOSIS — E1149 Type 2 diabetes mellitus with other diabetic neurological complication: Secondary | ICD-10-CM

## 2014-02-23 DIAGNOSIS — E1142 Type 2 diabetes mellitus with diabetic polyneuropathy: Secondary | ICD-10-CM

## 2014-02-23 LAB — GLUCOSE, CAPILLARY: Glucose-Capillary: 316 mg/dL — ABNORMAL HIGH (ref 70–99)

## 2014-02-23 LAB — POCT GLYCOSYLATED HEMOGLOBIN (HGB A1C): Hemoglobin A1C: 9.3

## 2014-02-23 MED ORDER — GUAIFENESIN-DM 100-10 MG/5ML PO SYRP: 5.0000 mL | ORAL_SOLUTION | ORAL | Status: DC | PRN

## 2014-02-23 MED ORDER — DIPHENHYDRAMINE HCL 25 MG PO CAPS: 25.0000 mg | ORAL_CAPSULE | Freq: Three times a day (TID) | ORAL | Status: DC | PRN

## 2014-02-23 MED ORDER — NAPHAZOLINE-PHENIRAMINE 0.025-0.3 % OP SOLN: 2.0000 [drp] | Freq: Four times a day (QID) | OPHTHALMIC | Status: DC | PRN

## 2014-02-23 NOTE — Patient Instructions (Signed)
HOME CARE  Wash your hands before and after applying medicated drops or creams.  Do not touch the drop or cream tube to your eye or eyelids.  Do not use your soft contacts. Throw them away. Use a new pair once recovery is complete.  Do not use your hard contacts. They need to be washed (sterilized) thoroughly after recovery is complete.  Put a cold cloth to your eye(s) if you have itching and burning. GET HELP RIGHT AWAY IF:   You are not feeling better in 2 to 3 days after treatment.  Your lids are sticky or stick together.  Fluid comes from the eye(s).  You become sensitive to light.  You have a temperature by mouth above 102 F (38.9 C).  You have pain in and around the eye(s).  You start to have vision problems. MAKE SURE YOU:   Understand these instructions.  Will watch your condition.  Will get help right away if you are not doing well or get worse. Use all the medications as recommended. Follow up in a week if symptoms persist or worsen. Check your fasting blood sugars daily and bring your glucometer to your next office visit.

## 2014-02-23 NOTE — Assessment & Plan Note (Signed)
A1C of 9.3 today. Patient didn't bring her glucometer to the office today. Patient states that she has not been taking her insulin last couple of days.  Plans: Recommended to check her fasting blood sugars and bring the values to her next office visit. Recommended to follow up with her PCP in 2-3 weeks. Educated about the importance of compliance to her insulin regimen.

## 2014-02-23 NOTE — Assessment & Plan Note (Addendum)
Patient symptoms and clinical exam suggestive of pharyngitis and conjunctivitis. Etiology is likely either viral or allergic. Suspect allergic etiology as she is complaining of itching of both eyes along with redness. No signs of bacterial co-infection. Discussed with the attending regarding further management and plan.  Plans: Topical antihistamines/decongestant eye drops for symptomatic relief. Oral antihistamines. Recommended to wash her eyes with cold water every few hours. Recommended to call the clinic if she develops worsening of symptoms or develops severe pain or notices any purulent discharge. Informed that it could be contagious and not to share handkerchiefs, tissues, towels, cosmetics, or bed sheets/pillows with uninfected family or friends. Recommended the role of hand washing in preventing the spread of infection. Guaifenesin/Dextromethorphan cough syrup as needed. Follow up as needed or if symptoms persist or worsen.

## 2014-02-23 NOTE — Progress Notes (Signed)
Subjective:   Patient ID: Kayla [name] female   DOB: 05-Jun-1961 53 y.o.   MRN: [id number]  HPI: Kayla [name] is a 52 y.o. woman with PMH significant for HTN, DM-II, HLD, Hypothyroidism comes to the office with CC of redness, itching in both eyes, sorethroat and cough x 2-3 days duration.  Patient reports that her symptoms started with redness/itching in her right eye 3 days ago. Few hours later she developed similar symptoms in the left eye. She also complains of clear watery discharge from both eyes, along with blurred vision. She reports burning discomfort in both eyes. She denies any trauma, purulent discharge from both eyes, using any contact lenses. She reports excessive tearing from both eyes, and eye being stuck in the morning.   She reports sore throat,  Bilateral ear pain along with cough. She complains of occasional yellowish colored sputum but denies any chest pain, SOB, fever, chills, body pains. She denies any other complaints.   Past Medical History  Diagnosis Date  . ANA POSITIVE 02/09/2010  . Type 2 diabetes mellitus   . Hyperlipidemia   . Dizziness     Multiple falls at home  . Diabetic neuropathy     Of hands and feet  . Sickle cell trait   . Fibromyalgia   . Hypothyroidism   . Deafness in right ear 2012    in the process of treatment  . Anal warts 11/09/2012   Current Outpatient Prescriptions  Medication Sig Dispense Refill  . ACCU-CHEK FASTCLIX LANCETS MISC 1 each by Does not apply route 4 (four) times daily -  before meals and at bedtime.  100 each  11  . Blood Glucose Monitoring Suppl (ACCU-CHEK AVIVA PLUS) W/DEVICE KIT 1 kit by Does not apply route as directed.  1 kit  0  . Blood Glucose Monitoring Suppl (ACCU-CHEK NANO SMARTVIEW) W/DEVICE KIT 1 each by Does not apply route 4 (four) times daily -  before meals and at bedtime.  1 kit  0  . diphenhydrAMINE (BENADRYL) 25 mg capsule Take 1 capsule (25 mg total) by mouth every 8 (eight) hours as needed.  60  capsule  0  . DULoxetine (CYMBALTA) 30 MG capsule Take 2 capsules (60 mg total) by mouth daily.  60 capsule  2  . gabapentin (NEURONTIN) 300 MG capsule Take 2 capsules (600 mg total) by mouth 4 (four) times daily.  240 capsule  2  . guaiFENesin-dextromethorphan (ROBITUSSIN DM) 100-10 MG/5ML syrup Take 5 mLs by mouth every 4 (four) hours as needed for cough.  118 mL  0  . insulin aspart (NOVOLOG FLEXPEN) 100 UNIT/ML injection Inject 3-5 Units into the skin 3 (three) times daily before meals.  3 vial  4  . insulin glargine (LANTUS) 100 UNIT/ML injection Inject 40 units at bedtime  15 mL  10  . levothyroxine (SYNTHROID, LEVOTHROID) 100 MCG tablet Take 1 tablet (100 mcg total) by mouth daily before breakfast.  30 tablet  11  . naphazoline-pheniramine (NAPHCON-A) 0.025-0.3 % ophthalmic solution Place 2 drops into both eyes 4 (four) times daily as needed for irritation.  15 mL  0  . olmesartan (BENICAR) 40 MG tablet Take 1 tablet (40 mg total) by mouth daily.  30 tablet  12  . zolpidem (AMBIEN) 5 MG tablet Take 1 tablet (5 mg total) by mouth at bedtime as needed.  30 tablet  3   No current facility-administered medications for this visit.   Family History  Problem Relation Age of  Onset  . Emphysema Mother   . Cancer Mother     unknown  . Hypertension Mother   . Cancer Father     pancreatic   . Diabetes Father   . Hypertension Father   . Down syndrome Brother   . Cancer Maternal Aunt     cancer  . Cancer Other     breast cancer aunt  . Stroke Sister   . Diabetes Sister   . Colon cancer Neg Hx   . Stomach cancer Neg Hx    History   Social History  . Marital Status: Single    Spouse Name: N/A    Number of Children: N/A  . Years of Education: 12   Occupational History  .     Social History Main Topics  . Smoking status: Former Smoker -- 5 years    Types: Cigarettes    Quit date: 09/30/1990  . Smokeless tobacco: Never Used  . Alcohol Use: No  . Drug Use: No  . Sexual Activity:  None   Other Topics Concern  . None   Social History Narrative   Lives in [place], alone. Looking for a job. Has a son in town who checks in on her.    Review of Systems: Pertinent items are noted in HPI. Objective:  Physical Exam: Filed Vitals:   02/23/14 1415  BP: 155/77  Pulse: 71  Temp: 97.7 F (36.5 C)  TempSrc: Oral  Height: '5\' 2"'  (1.575 m)  Weight: 181 lb 11.2 oz (82.419 kg)  SpO2: 98%   Constitutional: Vital signs reviewed.  Patient is a well-developed and well-nourished and is in no acute distress and cooperative with exam. Alert and oriented x3.  Head: Normocephalic and atraumatic Ear: TM normal bilaterally. Minimal wax noted in the left ear. Nose: No erythema or drainage noted.  Mildly swollen inferior turbinates noted. Mouth: Mild posterior pharyngeal erythema noted without any exudates. No tonsillar enlargement noted. Eyes: Moderate bilateral injection noted in both eyes. Watery discharge noted at the corners of the eye. Cornea appeared normal and no signs of iritis. No follicular lesions noted under the palpebral conjunctiva. No purulent discharge noted along the corners of the eye. Fundoscopy exam was within normal limits. Neck: No palpable cervical LN. Cardiovascular: RRR, S1 normal, S2 normal, no MRG. Pulmonary/Chest: normal respiratory effort, CTAB, no wheezes, rales, or rhonchi Neurological: A&O x3 Extremities: No pedal edema. Skin: Warm, dry and intact.  Psychiatric: Normal mood and affect.   Assessment & Plan:

## 2014-02-25 ENCOUNTER — Ambulatory Visit (INDEPENDENT_AMBULATORY_CARE_PROVIDER_SITE_OTHER): Payer: Self-pay | Admitting: Internal Medicine

## 2014-02-25 ENCOUNTER — Encounter: Payer: Self-pay | Admitting: Internal Medicine

## 2014-02-25 ENCOUNTER — Telehealth: Payer: Self-pay | Admitting: Internal Medicine

## 2014-02-25 ENCOUNTER — Other Ambulatory Visit: Payer: Self-pay | Admitting: Internal Medicine

## 2014-02-25 VITALS — BP 147/77 | HR 80 | Temp 98.1°F | Ht 62.0 in | Wt 185.3 lb

## 2014-02-25 DIAGNOSIS — B302 Viral pharyngoconjunctivitis: Secondary | ICD-10-CM

## 2014-02-25 DIAGNOSIS — E785 Hyperlipidemia, unspecified: Secondary | ICD-10-CM

## 2014-02-25 LAB — CBC WITH DIFFERENTIAL/PLATELET
Basophils Absolute: 0.1 10*3/uL (ref 0.0–0.1)
Basophils Relative: 1 % (ref 0–1)
Eosinophils Absolute: 0.2 10*3/uL (ref 0.0–0.7)
Eosinophils Relative: 2 % (ref 0–5)
HCT: 34.8 % — ABNORMAL LOW (ref 36.0–46.0)
Hemoglobin: 11.6 g/dL — ABNORMAL LOW (ref 12.0–15.0)
Lymphocytes Relative: 17 % (ref 12–46)
Lymphs Abs: 1.4 10*3/uL (ref 0.7–4.0)
MCH: 30.4 pg (ref 26.0–34.0)
MCHC: 33.3 g/dL (ref 30.0–36.0)
MCV: 91.1 fL (ref 78.0–100.0)
Monocytes Absolute: 0.4 10*3/uL (ref 0.1–1.0)
Monocytes Relative: 5 % (ref 3–12)
Neutro Abs: 6.2 10*3/uL (ref 1.7–7.7)
Neutrophils Relative %: 75 % (ref 43–77)
Platelets: 180 10*3/uL (ref 150–400)
RBC: 3.82 MIL/uL — ABNORMAL LOW (ref 3.87–5.11)
RDW: 14 % (ref 11.5–15.5)
WBC: 8.2 10*3/uL (ref 4.0–10.5)

## 2014-02-25 LAB — LIPID PANEL
Cholesterol: 312 mg/dL — ABNORMAL HIGH (ref 0–200)
HDL: 77 mg/dL (ref >39)
LDL Cholesterol: 205 mg/dL — ABNORMAL HIGH (ref 0–99)
Total CHOL/HDL Ratio: 4.1 Ratio
Triglycerides: 148 mg/dL (ref <150)
VLDL: 30 mg/dL (ref 0–40)

## 2014-02-25 LAB — C-REACTIVE PROTEIN: CRP: 0.7 mg/dL — ABNORMAL HIGH (ref <0.60)

## 2014-02-25 LAB — GLUCOSE, CAPILLARY: Glucose-Capillary: 339 mg/dL — ABNORMAL HIGH (ref 70–99)

## 2014-02-25 LAB — SEDIMENTATION RATE: Sed Rate: 61 mm/hr — ABNORMAL HIGH (ref 0–22)

## 2014-02-25 MED ORDER — ERYTHROMYCIN 5 MG/GM OP OINT: 1.0000 "application " | TOPICAL_OINTMENT | Freq: Four times a day (QID) | OPHTHALMIC | Status: DC

## 2014-02-25 NOTE — Progress Notes (Signed)
Patient ID: Kayla [name], female   DOB: 1961-05-26, 53 y.o.   MRN: [id number]   Subjective:   Patient ID: Kayla [name] female   DOB: Mar 06, 1961 53 y.o.   MRN: [id number]  HPI: [name] is a 53 y.o. with PMH of HTN, DM, Hypothyroidism, HLD, Depression. Pt previously presented in clinic 2 days ago with c/o of itchy eyes, bilaterally with watery eye discharge, also with headaches, sore throat and ear ache, with mild cough. Pt was managed a case pharyngoconjunctivitis- Possibly viral Vs Allergic, was given oral and topical antihistamines which she applied with no relief. Pt presented today with complaints of persistent Eye discharge now yellow that started yesterday, has to wash her face several times a day and yellowish drainage still recurs, redness of her eyes, eyes are stuck shut in the morning, itchiness and eye pain. She describes the eye pain as stabbing. Pt also complaints of photophobia and generalized headache. Headache is not the worst she has ever experienced, usually has neck pain from her fibromyalgia, but this has not changed in severity. Denies increase in normal body aches or myalgias, no loose stools, no nasal congestion, or sneezing. Endorses Chills but no fever. Pt says she hasn't had seasonal allergies in the past 3 years, but when she did it was usually coughing, no sneezing or itchy eyes.    Past Medical History  Diagnosis Date  . ANA POSITIVE 02/09/2010  . Type 2 diabetes mellitus   . Hyperlipidemia   . Dizziness     Multiple falls at home  . Diabetic neuropathy     Of hands and feet  . Sickle cell trait   . Fibromyalgia   . Hypothyroidism   . Deafness in right ear 2012    in the process of treatment  . Anal warts 11/09/2012   Current Outpatient Prescriptions  Medication Sig Dispense Refill  . ACCU-CHEK FASTCLIX LANCETS MISC 1 each by Does not apply route 4 (four) times daily -  before meals and at bedtime.  100 each  11  . Blood Glucose Monitoring Suppl (ACCU-CHEK  AVIVA PLUS) W/DEVICE KIT 1 kit by Does not apply route as directed.  1 kit  0  . Blood Glucose Monitoring Suppl (ACCU-CHEK NANO SMARTVIEW) W/DEVICE KIT 1 each by Does not apply route 4 (four) times daily -  before meals and at bedtime.  1 kit  0  . diphenhydrAMINE (BENADRYL) 25 mg capsule Take 1 capsule (25 mg total) by mouth every 8 (eight) hours as needed.  60 capsule  0  . DULoxetine (CYMBALTA) 30 MG capsule Take 2 capsules (60 mg total) by mouth daily.  60 capsule  2  . erythromycin ophthalmic ointment Place 1 application into both eyes every 6 (six) hours. Apply 1.25cm every 6 hours for 7 days.  3.5 g  0  . gabapentin (NEURONTIN) 300 MG capsule Take 2 capsules (600 mg total) by mouth 4 (four) times daily.  240 capsule  2  . guaiFENesin-dextromethorphan (ROBITUSSIN DM) 100-10 MG/5ML syrup Take 5 mLs by mouth every 4 (four) hours as needed for cough.  118 mL  0  . insulin aspart (NOVOLOG FLEXPEN) 100 UNIT/ML injection Inject 3-5 Units into the skin 3 (three) times daily before meals.  3 vial  4  . insulin glargine (LANTUS) 100 UNIT/ML injection Inject 40 units at bedtime  15 mL  10  . levothyroxine (SYNTHROID, LEVOTHROID) 100 MCG tablet Take 1 tablet (100 mcg total) by mouth daily before breakfast.  30  tablet  11  . naphazoline-pheniramine (NAPHCON-A) 0.025-0.3 % ophthalmic solution Place 2 drops into both eyes 4 (four) times daily as needed for irritation.  15 mL  0  . olmesartan (BENICAR) 40 MG tablet Take 1 tablet (40 mg total) by mouth daily.  30 tablet  12  . zolpidem (AMBIEN) 5 MG tablet Take 1 tablet (5 mg total) by mouth at bedtime as needed.  30 tablet  3   No current facility-administered medications for this visit.   Family History  Problem Relation Age of Onset  . Emphysema Mother   . Cancer Mother     unknown  . Hypertension Mother   . Cancer Father     pancreatic   . Diabetes Father   . Hypertension Father   . Down syndrome Brother   . Cancer Maternal Aunt     cancer  .  Cancer Other     breast cancer aunt  . Stroke Sister   . Diabetes Sister   . Colon cancer Neg Hx   . Stomach cancer Neg Hx    History   Social History  . Marital Status: Single    Spouse Name: N/A    Number of Children: N/A  . Years of Education: 12   Occupational History  .     Social History Main Topics  . Smoking status: Former Smoker -- 5 years    Types: Cigarettes    Quit date: 09/30/1990  . Smokeless tobacco: Never Used  . Alcohol Use: No  . Drug Use: No  . Sexual Activity: None   Other Topics Concern  . None   Social History Narrative   Lives in [place], alone. Looking for a job. Has a son in town who checks in on her.    Review of Systems: CONSTITUTIONAL- No night sweat or change in appetite. SKIN- No Rash, colour changes. HEAD- Has Headache, no dizziness. EYES- No Vision loss, pain, redness, double or blurred vision. EARS- No vertigo, hearing loss or ear discharge. Mouth/throat- Has Sorethroat. RESPIRATORY- No Cough or SOB. CARDIAC- No Palpitations, DOE, PND or chest pain. GI- No nausea, vomiting, diarrhoea, constipation, abd pain. URINARY- No Frequency, urgency, straining or dysuria. NEUROLOGIC- No Numbness, syncope, seizures. [facility]- Denies depression or anxiety.  Objective:  Physical Exam: Filed Vitals:   02/25/14 0946  BP: 147/77  Pulse: 80  Temp: 98.1 F (36.7 C)  TempSrc: Oral  Height: '5\' 2"'  (1.575 m)  Weight: 185 lb 4.8 oz (84.052 kg)  SpO2: 100%   GENERAL- alert, co-operative, appears as stated age, not in any distress. HEENT- Atraumatic, normocephalic, PERRL, EOMI, oral mucosa appears moist, no pharyngeal erythema or tonsilar enlargement,  neck supple, no cervical LN appreciated. Eye exam- Conjunctival injection, also involving the palpebral conjunctiva, with fine blood vessels, with yellowish discharge at the corners of both eyes. Ear exam- no drainage, ear drum not erythematous, some wax seen- left. CARDIAC- RRR, no murmurs, rubs or  gallops. RESP- Moving equal volumes of air, and clear to auscultation bilaterally, no wheezes or crackles. ABDOMEN- Soft, nontender, no guarding or rebound, no palpable masses or organomegaly, bowel sounds present. BACK- Normal curvature of the spine, No tenderness along the vertebrae, no CVA tenderness. NEURO- No obvious Cr N abnormality, strenght upper and lower extremities- 5/5, Gait- Normal. EXTREMITIES- pulse 2+, symmetric, minimal pitting pedal edema. SKIN- Warm, dry, No rash or lesion. PSYCH- Normal mood and affect, appropriate thought content and speech.   Assessment & Plan:  The patient's case and  plan of care was discussed with attending physician, Dr. Orie Fisherman.  Please see problem based charting for assessment and plan.

## 2014-02-25 NOTE — Assessment & Plan Note (Addendum)
Conjunctivitis most likely now bacterial- with now purulent discharge and redness, possibly secondary infection of initial viral conjunctivitis. Most likely organism- Staph aureus.  Sorethroat- does not appear inflammed, likely viral infection persisting. Pt also complained of eye pain and headache, there concern for other etiologies- like scleritis, or sequelae of other systemic conditions.  Plan- Erythromycin ointment- apply 1.25cm Q6H for 7 days, if bacterial pt should see improvement in 1-2 days. - Ophthalmology referral today considering complaints of eye pain. Pt has no insurance, working on [employer], pt says she will go to the eye center in [place] for now.  - CBC, ESR, CRP, UA.  - Follow up in 3-4 days.

## 2014-02-25 NOTE — Telephone Encounter (Signed)
  The patient called at about 7:15 AM. She reports that she was seen in clinic on 02/23/14, and diagnosed as possible viral  pharyngitis and conjunctivitis. She was treated with Topical antihistamines/decongestant eye drops and oral antihistamines. She has been using these medications. But she feels like her problem is getting worse. She has photophobia, blurry vision, discharge from her eyes which is yellow in color. She has chills, but no fever. I advised patient to call clinic after 8:00 AM and come to hospital for being reevaluated.  She agreed to do so. I will also sent a message to front desk.  Ivor Costa, MD PGY3, Internal Medicine Teaching Service Pager: [phone]

## 2014-02-25 NOTE — Patient Instructions (Signed)
General Instructions: We will be prescribing an eye ointment for you. Take 1.25cm or a half inch of the ointment and apply it four times a day for 7 days. You should see improvement in 1 to 2 days.  We will also be referring you an eye doctor.  We will run some tests here today, and will be contacted if we need to make any changes to the medications you are taking.   Please bring your medicines with you each time you come to clinic.  Medicines may include prescription medications, over-the-counter medications, herbal remedies, eye drops, vitamins, or other pills.   Progress Toward Treatment Goals:  Treatment Goal 08/09/2013  Hemoglobin A1C deteriorated  Blood pressure deteriorated  Prevent falls -    Self Care Goals & Plans:  Self Care Goal 02/23/2014  Manage my medications take my medicines as prescribed; bring my medications to every visit; refill my medications on time; follow the sick day instructions if I am sick  Monitor my health keep track of my blood glucose; keep track of my weight; check my feet daily  Eat healthy foods eat more vegetables; eat fruit for snacks and desserts; eat smaller portions  Be physically active find an activity I enjoy    Home Blood Glucose Monitoring 07/08/2013  Check my blood sugar 3 times a day  When to check my blood sugar before breakfast; before lunch; before dinner

## 2014-02-26 LAB — URINALYSIS, ROUTINE W REFLEX MICROSCOPIC
Bilirubin Urine: NEGATIVE
Glucose, UA: >1000 mg/dL — AB
Hgb urine dipstick: NEGATIVE
Ketones, ur: NEGATIVE mg/dL
Leukocytes, UA: NEGATIVE
Nitrite: NEGATIVE
Protein, ur: >300 mg/dL — AB
Specific Gravity, Urine: 1.023 (ref 1.005–1.030)
Urobilinogen, UA: 1 mg/dL (ref 0.0–1.0)
pH: 5.5 (ref 5.0–8.0)

## 2014-02-26 LAB — URINALYSIS, MICROSCOPIC ONLY
Casts: NONE SEEN
Crystals: NONE SEEN

## 2014-02-28 NOTE — Progress Notes (Signed)
INTERNAL MEDICINE TEACHING ATTENDING ADDENDUM - Kayla Contes, MD: I personally saw and evaluated [name] in this clinic visit in conjunction with the resident, Dr. Denton Brick. I have discussed patient's plan of care with medical resident during this visit. I have confirmed the physical exam findings and have read and agree with the clinic note including the plan with the following addition: - On exam pt with b/l purulent discharge from eyes associated with erythema and photophobia - Started on clindamycin ointment - Pt to follow up with opthal

## 2014-02-28 NOTE — Telephone Encounter (Signed)
Ambien rx called to [facility].

## 2014-03-02 ENCOUNTER — Telehealth: Payer: Self-pay | Admitting: Internal Medicine

## 2014-03-02 DIAGNOSIS — E785 Hyperlipidemia, unspecified: Secondary | ICD-10-CM

## 2014-03-02 NOTE — Telephone Encounter (Signed)
Called patient to inform about the lipid panel test results and wanted to find out what was the allergic reaction to the statins. The phone number listed on file is not in service. Her Diabetes puts her at CHD equivalent status and her hyperlipidemia needs to be treated. Statins are listed as allergic reaction but were stopped due to "non-compliance". I wanted to ask about what was the allergic reaction. I printed the lipid panel result and mailed it to the patient. I recommended to call the clinic in 3-4 weeks to make an appointment in [facility] to discuss the labs and further management of lipids.

## 2014-03-02 NOTE — Assessment & Plan Note (Signed)
Very high Total cholesterol and LDL cholesterol. Her Diabetes puts her at CHD equivalent status and her hyperlipidemia needs to be treated. Statins are listed as allergic reaction but were stopped due to "non-compliance". Called patient to inform about the lipid panel test results and wanted to find out what was the allergic reaction to the statins. The phone number listed on file is not in service.  Plans I printed the lipid panel result and mailed it to the patient. I recommended to call the clinic in 3-4 weeks to make an appointment in [facility] to discuss the labs and further management of lipids.

## 2014-03-02 NOTE — Telephone Encounter (Signed)
Called patient about results of tests, and to ask if she is still having eye pain. No response. Called pts emergency contact- Her daughter, she said pts eye is no longer crusting, but doesn't know if patient is having eye pain. Pt is supposed to see an ophthalmologist but has no insurance. Daughter has no other number for her mother. Complaints of eye pain, Increased ESR, CRP, and protein on Urinalysis, might be concerning for rheumatologic disease, scleritis, and will need ophthalmology evaluation. Pt should come in to clinic to complete [facility] card. Told pts daughter that Pt will need to come to clinic if persistent eye pain, and an Evaluation by an eye doctor is very important.   Bing Neighbors, MD. PGY-1

## 2014-03-10 NOTE — Progress Notes (Signed)
Case discussed with Dr. Boggala at the time of the visit.  We reviewed the resident's history and exam and pertinent patient test results.  I agree with the assessment, diagnosis, and plan of care documented in the resident's note. 

## 2014-03-14 ENCOUNTER — Other Ambulatory Visit: Payer: Self-pay | Admitting: *Deleted

## 2014-03-14 DIAGNOSIS — R809 Proteinuria, unspecified: Secondary | ICD-10-CM

## 2014-03-14 DIAGNOSIS — I10 Essential (primary) hypertension: Secondary | ICD-10-CM

## 2014-03-14 MED ORDER — GABAPENTIN 300 MG PO CAPS: 600.0000 mg | ORAL_CAPSULE | Freq: Four times a day (QID) | ORAL | Status: DC

## 2014-03-14 MED ORDER — OLMESARTAN MEDOXOMIL 40 MG PO TABS: 40.0000 mg | ORAL_TABLET | Freq: Every day | ORAL | Status: DC

## 2014-03-15 NOTE — Telephone Encounter (Signed)
Both Rx called in to pharmacy. 

## 2014-04-12 ENCOUNTER — Other Ambulatory Visit: Payer: Self-pay | Admitting: *Deleted

## 2014-04-12 DIAGNOSIS — IMO0002 Reserved for concepts with insufficient information to code with codable children: Secondary | ICD-10-CM

## 2014-04-12 DIAGNOSIS — E1165 Type 2 diabetes mellitus with hyperglycemia: Principal | ICD-10-CM

## 2014-04-12 DIAGNOSIS — E1140 Type 2 diabetes mellitus with diabetic neuropathy, unspecified: Secondary | ICD-10-CM

## 2014-04-12 MED ORDER — DULOXETINE HCL 30 MG PO CPEP: 60.0000 mg | ORAL_CAPSULE | Freq: Every day | ORAL | Status: DC

## 2014-04-12 NOTE — Telephone Encounter (Signed)
Needs appt next 30 days - PCP if possible - uncontrolled DM. Last routine appt was Sep 2014. Thanks

## 2014-04-13 NOTE — Telephone Encounter (Signed)
Called to pharm, sent to [facility] for appt and added note to bag at [facility] for appt

## 2014-04-15 ENCOUNTER — Encounter: Payer: Self-pay | Admitting: Internal Medicine

## 2014-05-26 ENCOUNTER — Encounter: Payer: Self-pay | Admitting: Internal Medicine

## 2014-06-27 ENCOUNTER — Inpatient Hospital Stay (HOSPITAL_COMMUNITY): Payer: Self-pay

## 2014-06-27 ENCOUNTER — Encounter: Payer: Self-pay | Admitting: Internal Medicine

## 2014-06-27 ENCOUNTER — Inpatient Hospital Stay (HOSPITAL_COMMUNITY)
Admission: AD | Admit: 2014-06-27 | Discharge: 2014-07-06 | DRG: 872 | Disposition: A | Discharge status: Home Health | Payer: Self-pay | Source: Ambulatory Visit | Attending: Oncology | Admitting: Oncology

## 2014-06-27 ENCOUNTER — Encounter (HOSPITAL_COMMUNITY): Payer: Self-pay | Admitting: Radiology

## 2014-06-27 ENCOUNTER — Ambulatory Visit (INDEPENDENT_AMBULATORY_CARE_PROVIDER_SITE_OTHER): Payer: Self-pay | Admitting: Internal Medicine

## 2014-06-27 ENCOUNTER — Other Ambulatory Visit: Payer: Self-pay | Admitting: Internal Medicine

## 2014-06-27 VITALS — BP 130/67 | HR 96 | Temp 98.4°F

## 2014-06-27 DIAGNOSIS — J9811 Atelectasis: Secondary | ICD-10-CM | POA: Diagnosis present

## 2014-06-27 DIAGNOSIS — Z6834 Body mass index (BMI) 34.0-34.9, adult: Secondary | ICD-10-CM

## 2014-06-27 DIAGNOSIS — Z87891 Personal history of nicotine dependence: Secondary | ICD-10-CM

## 2014-06-27 DIAGNOSIS — E1165 Type 2 diabetes mellitus with hyperglycemia: Secondary | ICD-10-CM | POA: Diagnosis present

## 2014-06-27 DIAGNOSIS — J449 Chronic obstructive pulmonary disease, unspecified: Secondary | ICD-10-CM | POA: Diagnosis present

## 2014-06-27 DIAGNOSIS — D72829 Elevated white blood cell count, unspecified: Secondary | ICD-10-CM

## 2014-06-27 DIAGNOSIS — R945 Abnormal results of liver function studies: Secondary | ICD-10-CM

## 2014-06-27 DIAGNOSIS — J9601 Acute respiratory failure with hypoxia: Secondary | ICD-10-CM

## 2014-06-27 DIAGNOSIS — A419 Sepsis, unspecified organism: Principal | ICD-10-CM

## 2014-06-27 DIAGNOSIS — R748 Abnormal levels of other serum enzymes: Secondary | ICD-10-CM | POA: Diagnosis present

## 2014-06-27 DIAGNOSIS — N184 Chronic kidney disease, stage 4 (severe): Secondary | ICD-10-CM | POA: Diagnosis present

## 2014-06-27 DIAGNOSIS — R591 Generalized enlarged lymph nodes: Secondary | ICD-10-CM | POA: Diagnosis present

## 2014-06-27 DIAGNOSIS — L02416 Cutaneous abscess of left lower limb: Secondary | ICD-10-CM | POA: Diagnosis present

## 2014-06-27 DIAGNOSIS — R740 Nonspecific elevation of levels of transaminase and lactic acid dehydrogenase [LDH]: Secondary | ICD-10-CM

## 2014-06-27 DIAGNOSIS — E034 Atrophy of thyroid (acquired): Secondary | ICD-10-CM

## 2014-06-27 DIAGNOSIS — N183 Chronic kidney disease, stage 3 unspecified: Secondary | ICD-10-CM

## 2014-06-27 DIAGNOSIS — R739 Hyperglycemia, unspecified: Secondary | ICD-10-CM

## 2014-06-27 DIAGNOSIS — E871 Hypo-osmolality and hyponatremia: Secondary | ICD-10-CM

## 2014-06-27 DIAGNOSIS — M797 Fibromyalgia: Secondary | ICD-10-CM

## 2014-06-27 DIAGNOSIS — R300 Dysuria: Secondary | ICD-10-CM | POA: Diagnosis present

## 2014-06-27 DIAGNOSIS — D649 Anemia, unspecified: Secondary | ICD-10-CM

## 2014-06-27 DIAGNOSIS — R7689 Other specified abnormal immunological findings in serum: Secondary | ICD-10-CM

## 2014-06-27 DIAGNOSIS — R7401 Elevation of levels of liver transaminase levels: Secondary | ICD-10-CM

## 2014-06-27 DIAGNOSIS — R51 Headache: Secondary | ICD-10-CM | POA: Diagnosis present

## 2014-06-27 DIAGNOSIS — E669 Obesity, unspecified: Secondary | ICD-10-CM | POA: Diagnosis present

## 2014-06-27 DIAGNOSIS — R110 Nausea: Secondary | ICD-10-CM

## 2014-06-27 DIAGNOSIS — G4700 Insomnia, unspecified: Secondary | ICD-10-CM

## 2014-06-27 DIAGNOSIS — Z794 Long term (current) use of insulin: Secondary | ICD-10-CM

## 2014-06-27 DIAGNOSIS — I10 Essential (primary) hypertension: Secondary | ICD-10-CM

## 2014-06-27 DIAGNOSIS — E1140 Type 2 diabetes mellitus with diabetic neuropathy, unspecified: Secondary | ICD-10-CM

## 2014-06-27 DIAGNOSIS — L03116 Cellulitis of left lower limb: Secondary | ICD-10-CM | POA: Diagnosis present

## 2014-06-27 DIAGNOSIS — E1149 Type 2 diabetes mellitus with other diabetic neurological complication: Secondary | ICD-10-CM

## 2014-06-27 DIAGNOSIS — I129 Hypertensive chronic kidney disease with stage 1 through stage 4 chronic kidney disease, or unspecified chronic kidney disease: Secondary | ICD-10-CM | POA: Diagnosis present

## 2014-06-27 DIAGNOSIS — L03119 Cellulitis of unspecified part of limb: Secondary | ICD-10-CM

## 2014-06-27 DIAGNOSIS — N390 Urinary tract infection, site not specified: Secondary | ICD-10-CM | POA: Diagnosis present

## 2014-06-27 DIAGNOSIS — R768 Other specified abnormal immunological findings in serum: Secondary | ICD-10-CM | POA: Diagnosis not present

## 2014-06-27 DIAGNOSIS — L0390 Cellulitis, unspecified: Secondary | ICD-10-CM

## 2014-06-27 DIAGNOSIS — IMO0001 Reserved for inherently not codable concepts without codable children: Secondary | ICD-10-CM

## 2014-06-27 DIAGNOSIS — E109 Type 1 diabetes mellitus without complications: Secondary | ICD-10-CM

## 2014-06-27 DIAGNOSIS — L03314 Cellulitis of groin: Secondary | ICD-10-CM

## 2014-06-27 DIAGNOSIS — D573 Sickle-cell trait: Secondary | ICD-10-CM | POA: Diagnosis present

## 2014-06-27 DIAGNOSIS — R0902 Hypoxemia: Secondary | ICD-10-CM | POA: Diagnosis not present

## 2014-06-27 DIAGNOSIS — K8301 Primary sclerosing cholangitis: Secondary | ICD-10-CM

## 2014-06-27 DIAGNOSIS — K5900 Constipation, unspecified: Secondary | ICD-10-CM

## 2014-06-27 DIAGNOSIS — R7989 Other specified abnormal findings of blood chemistry: Secondary | ICD-10-CM

## 2014-06-27 DIAGNOSIS — K7460 Unspecified cirrhosis of liver: Secondary | ICD-10-CM | POA: Diagnosis present

## 2014-06-27 DIAGNOSIS — R17 Unspecified jaundice: Secondary | ICD-10-CM | POA: Diagnosis present

## 2014-06-27 DIAGNOSIS — L02419 Cutaneous abscess of limb, unspecified: Secondary | ICD-10-CM

## 2014-06-27 DIAGNOSIS — E039 Hypothyroidism, unspecified: Secondary | ICD-10-CM

## 2014-06-27 DIAGNOSIS — E46 Unspecified protein-calorie malnutrition: Secondary | ICD-10-CM

## 2014-06-27 DIAGNOSIS — N189 Chronic kidney disease, unspecified: Secondary | ICD-10-CM

## 2014-06-27 DIAGNOSIS — L0291 Cutaneous abscess, unspecified: Secondary | ICD-10-CM

## 2014-06-27 DIAGNOSIS — E890 Postprocedural hypothyroidism: Secondary | ICD-10-CM | POA: Diagnosis present

## 2014-06-27 DIAGNOSIS — Y92538 Other ambulatory health services establishments as the place of occurrence of the external cause: Secondary | ICD-10-CM

## 2014-06-27 DIAGNOSIS — W1830XA Fall on same level, unspecified, initial encounter: Secondary | ICD-10-CM | POA: Diagnosis present

## 2014-06-27 DIAGNOSIS — N179 Acute kidney failure, unspecified: Secondary | ICD-10-CM

## 2014-06-27 DIAGNOSIS — R079 Chest pain, unspecified: Secondary | ICD-10-CM

## 2014-06-27 DIAGNOSIS — IMO0002 Reserved for concepts with insufficient information to code with codable children: Secondary | ICD-10-CM

## 2014-06-27 DIAGNOSIS — E1142 Type 2 diabetes mellitus with diabetic polyneuropathy: Secondary | ICD-10-CM | POA: Diagnosis present

## 2014-06-27 HISTORY — DX: Cardiac murmur, unspecified: R01.1

## 2014-06-27 HISTORY — DX: Unspecified chronic bronchitis: J42

## 2014-06-27 HISTORY — DX: Essential (primary) hypertension: I10

## 2014-06-27 HISTORY — DX: Headache: R51

## 2014-06-27 HISTORY — DX: Anxiety disorder, unspecified: F41.9

## 2014-06-27 HISTORY — DX: Type 2 diabetes mellitus with diabetic polyneuropathy: E11.42

## 2014-06-27 HISTORY — DX: Unspecified osteoarthritis, unspecified site: M19.90

## 2014-06-27 HISTORY — DX: Chronic obstructive pulmonary disease, unspecified: J44.9

## 2014-06-27 HISTORY — DX: Thyrotoxicosis with diffuse goiter without thyrotoxic crisis or storm: E05.00

## 2014-06-27 HISTORY — DX: Cellulitis, unspecified: L03.90

## 2014-06-27 LAB — COMPREHENSIVE METABOLIC PANEL
ALT: 26 U/L (ref 0–35)
AST: 30 U/L (ref 0–37)
Albumin: 2.7 g/dL — ABNORMAL LOW (ref 3.5–5.2)
Alkaline Phosphatase: 311 U/L — ABNORMAL HIGH (ref 39–117)
Anion gap: 19 — ABNORMAL HIGH (ref 5–15)
BUN: 19 mg/dL (ref 6–23)
CO2: 19 mEq/L (ref 19–32)
Calcium: 10.2 mg/dL (ref 8.4–10.5)
Chloride: 89 mEq/L — ABNORMAL LOW (ref 96–112)
Creatinine, Ser: 1.52 mg/dL — ABNORMAL HIGH (ref 0.50–1.10)
GFR calc Af Amer: 44 mL/min — ABNORMAL LOW (ref >90)
GFR calc non Af Amer: 38 mL/min — ABNORMAL LOW (ref >90)
Glucose, Bld: 374 mg/dL — ABNORMAL HIGH (ref 70–99)
Potassium: 4.3 mEq/L (ref 3.7–5.3)
Sodium: 127 mEq/L — ABNORMAL LOW (ref 137–147)
Total Bilirubin: 4.8 mg/dL — ABNORMAL HIGH (ref 0.3–1.2)
Total Protein: 8.1 g/dL (ref 6.0–8.3)

## 2014-06-27 LAB — CBC WITH DIFFERENTIAL/PLATELET
Basophils Absolute: 0 10*3/uL (ref 0.0–0.1)
Basophils Relative: 0 % (ref 0–1)
Eosinophils Absolute: 0 10*3/uL (ref 0.0–0.7)
Eosinophils Relative: 0 % (ref 0–5)
HCT: 30.4 % — ABNORMAL LOW (ref 36.0–46.0)
Hemoglobin: 10.9 g/dL — ABNORMAL LOW (ref 12.0–15.0)
Lymphocytes Relative: 5 % — ABNORMAL LOW (ref 12–46)
Lymphs Abs: 1.5 10*3/uL (ref 0.7–4.0)
MCH: 31 pg (ref 26.0–34.0)
MCHC: 35.9 g/dL (ref 30.0–36.0)
MCV: 86.4 fL (ref 78.0–100.0)
Monocytes Absolute: 1.5 10*3/uL — ABNORMAL HIGH (ref 0.1–1.0)
Monocytes Relative: 5 % (ref 3–12)
Neutro Abs: 27.1 10*3/uL — ABNORMAL HIGH (ref 1.7–7.7)
Neutrophils Relative %: 90 % — ABNORMAL HIGH (ref 43–77)
Platelets: 212 10*3/uL (ref 150–400)
RBC: 3.52 MIL/uL — ABNORMAL LOW (ref 3.87–5.11)
RDW: 13.9 % (ref 11.5–15.5)
WBC: 30.1 10*3/uL — ABNORMAL HIGH (ref 4.0–10.5)

## 2014-06-27 LAB — GLUCOSE, CAPILLARY
Glucose-Capillary: 384 mg/dL — ABNORMAL HIGH (ref 70–99)
Glucose-Capillary: 447 mg/dL — ABNORMAL HIGH (ref 70–99)
Glucose-Capillary: 307 mg/dL — ABNORMAL HIGH (ref 70–99)

## 2014-06-27 LAB — KETONES, QUALITATIVE: Acetone, Bld: NEGATIVE

## 2014-06-27 LAB — URINALYSIS, ROUTINE W REFLEX MICROSCOPIC
Glucose, UA: >1000 mg/dL — AB
Ketones, ur: 15 mg/dL — AB
Leukocytes, UA: NEGATIVE
Nitrite: POSITIVE — AB
Protein, ur: 100 mg/dL — AB
Specific Gravity, Urine: 1.031 — ABNORMAL HIGH (ref 1.005–1.030)
Urobilinogen, UA: 1 mg/dL (ref 0.0–1.0)
pH: 5.5 (ref 5.0–8.0)

## 2014-06-27 LAB — URINE MICROSCOPIC-ADD ON

## 2014-06-27 LAB — CREATININE, URINE, RANDOM: Creatinine, Urine: 98.7 mg/dL

## 2014-06-27 LAB — TROPONIN I: Troponin I: <0.3 ng/mL (ref <0.30)

## 2014-06-27 MED ORDER — VANCOMYCIN HCL IN DEXTROSE 750-5 MG/150ML-% IV SOLN
750.0000 mg | Freq: Two times a day (BID) | INTRAVENOUS | Status: DC
  Administered 2014-06-28 – 2014-06-29 (×3): 750 mg via INTRAVENOUS
  Filled 2014-06-27 (×4): qty 150

## 2014-06-27 MED ORDER — GABAPENTIN 300 MG PO CAPS
600.0000 mg | ORAL_CAPSULE | Freq: Four times a day (QID) | ORAL | Status: DC
  Administered 2014-06-27 – 2014-06-28 (×5): 600 mg via ORAL
  Filled 2014-06-27 (×9): qty 2

## 2014-06-27 MED ORDER — PIPERACILLIN-TAZOBACTAM 3.375 G IVPB
3.3750 g | Freq: Three times a day (TID) | INTRAVENOUS | Status: DC
  Administered 2014-06-27 – 2014-06-29 (×6): 3.375 g via INTRAVENOUS
  Filled 2014-06-27 (×9): qty 50

## 2014-06-27 MED ORDER — HYDROCODONE-ACETAMINOPHEN 5-325 MG PO TABS
1.0000 | ORAL_TABLET | Freq: Four times a day (QID) | ORAL | Status: DC | PRN
  Administered 2014-06-27: 2 via ORAL
  Administered 2014-06-27: 1 via ORAL
  Administered 2014-06-28 – 2014-07-06 (×6): 2 via ORAL
  Filled 2014-06-27 (×8): qty 2

## 2014-06-27 MED ORDER — PIPERACILLIN-TAZOBACTAM 3.375 G IVPB 30 MIN
3.3750 g | Freq: Once | INTRAVENOUS | Status: AC
  Administered 2014-06-27: 3.375 g via INTRAVENOUS
  Filled 2014-06-27: qty 50

## 2014-06-27 MED ORDER — NAPHAZOLINE-PHENIRAMINE 0.025-0.3 % OP SOLN
2.0000 [drp] | Freq: Four times a day (QID) | OPHTHALMIC | Status: DC | PRN
Start: 2014-06-27 — End: 2014-07-06
  Filled 2014-06-27: qty 15

## 2014-06-27 MED ORDER — PANTOPRAZOLE SODIUM 40 MG PO TBEC
40.0000 mg | DELAYED_RELEASE_TABLET | Freq: Every day | ORAL | Status: DC
  Administered 2014-06-27 – 2014-07-06 (×10): 40 mg via ORAL
  Filled 2014-06-27 (×10): qty 1

## 2014-06-27 MED ORDER — ENOXAPARIN SODIUM 40 MG/0.4ML ~~LOC~~ SOLN
40.0000 mg | SUBCUTANEOUS | Status: DC
  Filled 2014-06-27: qty 0.4

## 2014-06-27 MED ORDER — IOHEXOL 300 MG/ML  SOLN
80.0000 mL | Freq: Once | INTRAMUSCULAR | Status: AC | PRN
  Administered 2014-06-27: 80 mL via INTRAVENOUS

## 2014-06-27 MED ORDER — DULOXETINE HCL 60 MG PO CPEP
60.0000 mg | ORAL_CAPSULE | Freq: Every day | ORAL | Status: DC
  Administered 2014-06-27 – 2014-07-06 (×10): 60 mg via ORAL
  Filled 2014-06-27 (×10): qty 1

## 2014-06-27 MED ORDER — ENOXAPARIN SODIUM 40 MG/0.4ML ~~LOC~~ SOLN
40.0000 mg | SUBCUTANEOUS | Status: DC
  Administered 2014-06-27 – 2014-06-28 (×2): 40 mg via SUBCUTANEOUS
  Filled 2014-06-27 (×3): qty 0.4

## 2014-06-27 MED ORDER — IRBESARTAN 300 MG PO TABS: 300.0000 mg | ORAL_TABLET | Freq: Every day | ORAL | Status: DC

## 2014-06-27 MED ORDER — INSULIN ASPART 100 UNIT/ML ~~LOC~~ SOLN
0.0000 [IU] | Freq: Three times a day (TID) | SUBCUTANEOUS | Status: DC
  Administered 2014-06-27: 15 [IU] via SUBCUTANEOUS

## 2014-06-27 MED ORDER — ACETAMINOPHEN 325 MG PO TABS: 650.0000 mg | ORAL_TABLET | Freq: Every day | ORAL | Status: DC | PRN

## 2014-06-27 MED ORDER — VANCOMYCIN HCL 10 G IV SOLR
1500.0000 mg | Freq: Once | INTRAVENOUS | Status: AC
  Administered 2014-06-27: 1500 mg via INTRAVENOUS
  Filled 2014-06-27: qty 1500

## 2014-06-27 MED ORDER — SODIUM CHLORIDE 0.9 % IJ SOLN
3.0000 mL | Freq: Two times a day (BID) | INTRAMUSCULAR | Status: DC
  Administered 2014-06-27 – 2014-07-05 (×9): 3 mL via INTRAVENOUS

## 2014-06-27 MED ORDER — SODIUM CHLORIDE 0.9 % IV SOLN
INTRAVENOUS | Status: DC
  Administered 2014-06-27 (×2): via INTRAVENOUS

## 2014-06-27 MED ORDER — ACETAMINOPHEN 325 MG PO TABS
325.0000 mg | ORAL_TABLET | Freq: Every day | ORAL | Status: DC | PRN
  Filled 2014-06-27: qty 1

## 2014-06-27 MED ORDER — ONDANSETRON HCL 4 MG/2ML IJ SOLN: 4.0000 mg | Freq: Four times a day (QID) | INTRAMUSCULAR | Status: DC | PRN

## 2014-06-27 MED ORDER — LEVOTHYROXINE SODIUM 100 MCG PO TABS
100.0000 ug | ORAL_TABLET | Freq: Every day | ORAL | Status: DC
  Administered 2014-06-28 – 2014-07-06 (×9): 100 ug via ORAL
  Filled 2014-06-27 (×11): qty 1

## 2014-06-27 MED ORDER — INSULIN GLARGINE 100 UNIT/ML ~~LOC~~ SOLN
20.0000 [IU] | Freq: Every day | SUBCUTANEOUS | Status: DC
  Administered 2014-06-27: 20 [IU] via SUBCUTANEOUS
  Filled 2014-06-27 (×2): qty 0.2

## 2014-06-27 MED ORDER — ZOLPIDEM TARTRATE 5 MG PO TABS
5.0000 mg | ORAL_TABLET | Freq: Every day | ORAL | Status: DC
  Administered 2014-06-27 – 2014-07-06 (×8): 5 mg via ORAL
  Filled 2014-06-27 (×8): qty 1

## 2014-06-27 MED ORDER — SENNOSIDES-DOCUSATE SODIUM 8.6-50 MG PO TABS
1.0000 | ORAL_TABLET | Freq: Every day | ORAL | Status: DC
  Administered 2014-06-27 – 2014-07-05 (×9): 1 via ORAL
  Filled 2014-06-27 (×9): qty 1

## 2014-06-27 MED ORDER — INSULIN ASPART 100 UNIT/ML ~~LOC~~ SOLN
0.0000 [IU] | SUBCUTANEOUS | Status: DC
  Administered 2014-06-27: 11 [IU] via SUBCUTANEOUS
  Administered 2014-06-28: 8 [IU] via SUBCUTANEOUS
  Administered 2014-06-28: 5 [IU] via SUBCUTANEOUS
  Administered 2014-06-28: 3 [IU] via SUBCUTANEOUS
  Administered 2014-06-28: 5 [IU] via SUBCUTANEOUS
  Administered 2014-06-28: 11 [IU] via SUBCUTANEOUS
  Administered 2014-06-28 – 2014-06-29 (×2): 5 [IU] via SUBCUTANEOUS
  Administered 2014-06-29: 8 [IU] via SUBCUTANEOUS
  Administered 2014-06-29: 11 [IU] via SUBCUTANEOUS

## 2014-06-27 NOTE — Progress Notes (Addendum)
Patient ID: Kayla [name], female   DOB: 1960/11/23, 53 y.o.   MRN: [id number]   Subjective:   Patient ID: Kayla [name] female   DOB: 13-Oct-1960 53 y.o.   MRN: [id number]  HPI: Kayla [name] is a 53 y.o. with PMH listed below presented today with complaints of left upper thigh swelling and tenderness, started about 1 week ago, gradually increasing in size and pain, such that she can hardly walk. Pt denies any trauma to area. Normally has constipation. Has pain with passing urine started 3 days ago. Pt says hse had a fever also - Friday through Saturday- 100.5 recorded. Pt has not taken any thing for swelling, pain or fever. Pt has malaise and nausea but no vomiting. Pt endorses having previous episode of similar occurrence- labia. Family hx of boils.  Past Medical History  Diagnosis Date  . ANA POSITIVE 02/09/2010  . Type 2 diabetes mellitus   . Hyperlipidemia   . Dizziness     Multiple falls at home  . Diabetic neuropathy     Of hands and feet  . Sickle cell trait   . Fibromyalgia   . Hypothyroidism   . Deafness in right ear 2012    in the process of treatment  . Anal warts 11/09/2012   Current Outpatient Prescriptions  Medication Sig Dispense Refill  . ACCU-CHEK FASTCLIX LANCETS MISC 1 each by Does not apply route 4 (four) times daily -  before meals and at bedtime.  100 each  11  . Blood Glucose Monitoring Suppl (ACCU-CHEK AVIVA PLUS) W/DEVICE KIT 1 kit by Does not apply route as directed.  1 kit  0  . Blood Glucose Monitoring Suppl (ACCU-CHEK NANO SMARTVIEW) W/DEVICE KIT 1 each by Does not apply route 4 (four) times daily -  before meals and at bedtime.  1 kit  0  . diphenhydrAMINE (BENADRYL) 25 mg capsule Take 1 capsule (25 mg total) by mouth every 8 (eight) hours as needed.  60 capsule  0  . DULoxetine (CYMBALTA) 30 MG capsule Take 2 capsules (60 mg total) by mouth daily.  180 capsule  0  . erythromycin ophthalmic ointment Place 1 application into both eyes every 6 (six) hours.  Apply 1.25cm every 6 hours for 7 days.  3.5 g  0  . gabapentin (NEURONTIN) 300 MG capsule Take 2 capsules (600 mg total) by mouth 4 (four) times daily.  240 capsule  2  . guaiFENesin-dextromethorphan (ROBITUSSIN DM) 100-10 MG/5ML syrup Take 5 mLs by mouth every 4 (four) hours as needed for cough.  118 mL  0  . insulin aspart (NOVOLOG FLEXPEN) 100 UNIT/ML injection Inject 3-5 Units into the skin 3 (three) times daily before meals.  3 vial  4  . insulin glargine (LANTUS) 100 UNIT/ML injection Inject 40 units at bedtime  15 mL  10  . levothyroxine (SYNTHROID, LEVOTHROID) 100 MCG tablet Take 1 tablet (100 mcg total) by mouth daily before breakfast.  30 tablet  11  . naphazoline-pheniramine (NAPHCON-A) 0.025-0.3 % ophthalmic solution Place 2 drops into both eyes 4 (four) times daily as needed for irritation.  15 mL  0  . olmesartan (BENICAR) 40 MG tablet Take 1 tablet (40 mg total) by mouth daily.  30 tablet  12  . zolpidem (AMBIEN) 5 MG tablet TAKE 1 TABLET BY MOUTH AT BEDTIME AS NEEDED  30 tablet  3   No current facility-administered medications for this visit.   Family History  Problem Relation Age of Onset  .  Emphysema Mother   . Cancer Mother     unknown  . Hypertension Mother   . Cancer Father     pancreatic   . Diabetes Father   . Hypertension Father   . Down syndrome Brother   . Cancer Maternal Aunt     cancer  . Cancer Other     breast cancer aunt  . Stroke Sister   . Diabetes Sister   . Colon cancer Neg Hx   . Stomach cancer Neg Hx    History   Social History  . Marital Status: Single    Spouse Name: N/A    Number of Children: N/A  . Years of Education: 12   Occupational History  .     Social History Main Topics  . Smoking status: Former Smoker -- 5 years    Types: Cigarettes    Quit date: 09/30/1990  . Smokeless tobacco: Never Used  . Alcohol Use: No  . Drug Use: No  . Sexual Activity: Not on file   Other Topics Concern  . Not on file   Social History  Narrative   Lives in [place], alone. Looking for a job. Has a son in town who checks in on her.    Review of Systems: CONSTITUTIONAL- No Fever, weightloss, night sweat or change in appetite. SKIN- No Rash, colour changes or itching. HEAD- No Headache or dizziness. EYES- No Vision loss, pain, redness, double or blurred vision. EARS- No vertigo, hearing loss or ear discharge. Mouth/throat- No Sorethroat, dentures, or bleeding gums. RESPIRATORY- No Cough or SOB. CARDIAC- No Palpitations, DOE, PND or chest pain. GI- No nausea, vomiting, diarrhoea, constipation, abd pain. URINARY- No Frequency, urgency, straining or dysuria. NEUROLOGIC- No Numbness, syncope, seizures or burning. PYSCH- Denies depression or anxiety.  Objective:  Physical Exam: Filed Vitals:   06/27/14 1453  BP: 130/67  Pulse: 102  Temp: 98.4 F (36.9 C)  TempSrc: Oral  SpO2: 100%   GENERAL- alert, co-operative, appears as stated age, not in any distress. HEENT- Atraumatic, normocephalic, PERRL, EOMI, No carotid bruit, no cervical LN enlargement. CARDIAC- Tachycardic, no murmurs, rubs or gallops. RESP- Moving equal volumes of air, and clear to auscultation bilaterally, no wheezes or crackles. ABDOMEN- Soft, nontender, no guarding or rebound, no palpable masses or organomegaly, bowel sounds present. NEURO- No obvious Cr N abnormality, strenght upper and lower extremities. EXTREMITIES- Left upper- at least a ~10 by ~8 cm swelling starting at the medial side of upper thigh extending posteriorly to peri-anal region, tense, swelling anteriorly reddish, markedly tender, not fluctuant. SKIN- Warm, dry, No rash or lesion. PSYCH- Normal mood and affect, appropriate thought content and speech.  Assessment & Plan:   The patient's case and plan of care was discussed with attending physician, Dr. Narendra.  Large Left upper thigh/groin Abscess- Pt tachycardic-102, fever- 100.5 over the weekend, diabetic- uncontrolled. Will  admit to tele, pt will require some imaging- Most likely an MRI, to determine extent, and possibly  drainage, and initial IV antibiotics. - Admit to IMTS.  Dysuria- Since Friday.  - UA.   

## 2014-06-27 NOTE — Progress Notes (Addendum)
ANTIBIOTIC CONSULT NOTE - INITIAL  Pharmacy Consult for Zosyn and vancomycin Indication: wound infection  Allergies  Allergen Reactions  . Ace Inhibitors     Dizziness, nausea  . Statins   . Sulfamethoxazole Rash  . Sulfites Rash    Patient Measurements: Height: 5' 1.81" (157 cm) Weight: 185 lb 6.5 oz (84.1 kg) IBW/kg (Calculated) : 49.67  Vital Signs: Temp: 99 F (37.2 C) (09/28 1641) Temp src: Oral (09/28 1641) BP: 128/59 mmHg (09/28 1641) Pulse Rate: 60 (09/28 1641) Intake/Output from previous day:   Intake/Output from this shift:    Labs: No results found for this basename: WBC, HGB, PLT, LABCREA, CREATININE,  in the last 72 hours Estimated Creatinine Clearance: 58.4 ml/min (by C-G formula based on Cr of 1.13). No results found for this basename: VANCOTROUGH, VANCOPEAK, VANCORANDOM, GENTTROUGH, GENTPEAK, GENTRANDOM, TOBRATROUGH, TOBRAPEAK, TOBRARND, AMIKACINPEAK, AMIKACINTROU, AMIKACIN,  in the last 72 hours   Microbiology: No results found for this or any previous visit (from the past 720 hour(s)).  Medical History: Past Medical History  Diagnosis Date  . ANA POSITIVE 02/09/2010  . Type 2 diabetes mellitus   . Hyperlipidemia   . Dizziness     Multiple falls at home  . Diabetic neuropathy     Of hands and feet  . Sickle cell trait   . Fibromyalgia   . Hypothyroidism   . Deafness in right ear 2012    in the process of treatment  . Anal warts 11/09/2012    Assessment: 53 YOF seen with left upper thigh swelling and tenderness x1 week. Denies trauma to the area, and reports a fever of 100.5 this weekend. She has had a previous similar occurrence on labia.  Currently, temp is 99 and she can become tachycardic. A CBC for today has been ordered, but not yet drawn. Most recent SCr on file in our system is from 2013 and most recent result from [place] information from [facility] is from 2014. No history of renal dysfunction noted, however she has a history of  DM. An MRI has not yet been completed.  Goal of Therapy:  Vancomycin trough level 15-20 mcg/ml until abscess is ruled out  Plan:  1. Stat BMET 2. Zosyn 3.375g IV q8h 3. Vancomycin 1500mg  IV x1 (~17mg /kg) as loading dose. Maintenance dose will be entered when renal function is available 4. Follow MRI results, clinical progression, renal function, trough at [facility] D. Brysin Towery, PharmD, BCPS Clinical Pharmacist Pager: [phone] 06/27/2014 5:15 PM   ADDENDUM BMET resulted with SCr of 1.52 and est CrCl of 56mL/min  Plan: 1. Maintenance vancomycin dosing of 750mg  IV q12h starting at 0600 tomorrow morning 2. Rest of plan as above  Jacques Willingham D. Buryl Bamber, PharmD, BCPS Clinical Pharmacist Pager: [phone] 06/27/2014 6:50 PM

## 2014-06-27 NOTE — Progress Notes (Signed)
Asked to dose Lovenox for VTE prophylaxis.  Patient does not have a BMI >30 and CrCl is >24mL/min.  Therefore, patient qualifies for typical dosing of Lovenox 40mg  subQ q24h- dose has been entered. Pharmacy to sign off.  Colleen Donahoe D. Fonnie Crookshanks, PharmD, BCPS Clinical Pharmacist 06/27/2014 8:44 PM

## 2014-06-27 NOTE — H&P (Signed)
Date: 06/27/2014               Patient Name:  Kayla [name] MRN: [id number]  DOB: 1961/08/29 Age / Sex: 53 y.o., female   PCP: Kayla Moore, MD         Medical Service: Internal Medicine Teaching Service         Attending Physician: Dr. Sid Falcon, MD    First Contact: Dr. Randell Patient Pager: [phone]  Second Contact: Dr. Aundra [name]  Pager: [phone]       After Hours (After 5p/  First Contact Pager: [phone]  weekends / holidays): Second Contact Pager: [phone]   Chief Complaint: L thigh pain and swelling  History of Present Illness: Kayla [name] is a 53 year old woman with history of DM2 c/b neuropathy, fibromyalgia presenting with L upper thigh swelling and tenderness. She first noticed a small bump at the beginning of last week. She tried epson salt soak, hydrogen peroxide and warm compress. It increased in tenderness and expanded in size. Denies drainage or trauma. Denies recent sexual contact or vaginal discharge. She reports she has dysuria that started yesterday. She previously had an abscess of both labia 4-5 years ago and left buttock a couple weeks after that she underwent I&D.  She had fever to 100.5 Saturday, chills, headache, shortness of breath, chest pain. She reports the mid chest pain has been ongoing for a month and is reproducible on palpation. She also has heartburn, but no reflux. She denies cough, change in chronic nausea, vomiting, abdominal pain, diarrhea, rectal pain, edema, rash.  Meds: Prescriptions prior to admission  Medication Sig Dispense Refill  . DULoxetine (CYMBALTA) 30 MG capsule Take 2 capsules (60 mg total) by mouth daily.  180 capsule  0  . gabapentin (NEURONTIN) 300 MG capsule Take 2 capsules (600 mg total) by mouth 4 (four) times daily.  240 capsule  2  . insulin glargine (LANTUS) 100 UNIT/ML injection Inject 20 Units into the skin 2 (two) times daily.      Marland Kitchen levothyroxine (SYNTHROID, LEVOTHROID) 100 MCG tablet Take 1 tablet (100 mcg total) by mouth daily  before breakfast.  30 tablet  11  . olmesartan (BENICAR) 40 MG tablet Take 40 mg by mouth daily as needed. Patient only takes if she feels her blood pressure is high, will have daughter check if over 130 patient will take medication.      Marland Kitchen zolpidem (AMBIEN) 5 MG tablet Take 5 mg by mouth at bedtime as needed for sleep.      Marland Kitchen ACCU-CHEK FASTCLIX LANCETS MISC 1 each by Does not apply route 4 (four) times daily -  before meals and at bedtime.  100 each  11  . Blood Glucose Monitoring Suppl (ACCU-CHEK AVIVA PLUS) W/DEVICE KIT 1 kit by Does not apply route as directed.  1 kit  0  . Blood Glucose Monitoring Suppl (ACCU-CHEK NANO SMARTVIEW) W/DEVICE KIT 1 each by Does not apply route 4 (four) times daily -  before meals and at bedtime.  1 kit  0  . guaiFENesin-dextromethorphan (ROBITUSSIN DM) 100-10 MG/5ML syrup Take 5 mLs by mouth every 4 (four) hours as needed for cough.  118 mL  0  . insulin aspart (NOVOLOG FLEXPEN) 100 UNIT/ML injection Inject 3-5 Units into the skin 3 (three) times daily before meals.  3 vial  4   Medications Prior to Admission  Medication Sig Dispense Refill  . DULoxetine (CYMBALTA) 30 MG capsule Take 2 capsules (60 mg total) by mouth daily.  180 capsule  0  . gabapentin (NEURONTIN) 300 MG capsule Take 2 capsules (600 mg total) by mouth 4 (four) times daily.  240 capsule  2  . insulin glargine (LANTUS) 100 UNIT/ML injection Inject 20 Units into the skin 2 (two) times daily.      Marland Kitchen levothyroxine (SYNTHROID, LEVOTHROID) 100 MCG tablet Take 1 tablet (100 mcg total) by mouth daily before breakfast.  30 tablet  11  . olmesartan (BENICAR) 40 MG tablet Take 40 mg by mouth daily as needed. Patient only takes if she feels her blood pressure is high, will have daughter check if over 130 patient will take medication.      Marland Kitchen zolpidem (AMBIEN) 5 MG tablet Take 5 mg by mouth at bedtime as needed for sleep.      Marland Kitchen ACCU-CHEK FASTCLIX LANCETS MISC 1 each by Does not apply route 4 (four) times daily  -  before meals and at bedtime.  100 each  11  . Blood Glucose Monitoring Suppl (ACCU-CHEK AVIVA PLUS) W/DEVICE KIT 1 kit by Does not apply route as directed.  1 kit  0  . Blood Glucose Monitoring Suppl (ACCU-CHEK NANO SMARTVIEW) W/DEVICE KIT 1 each by Does not apply route 4 (four) times daily -  before meals and at bedtime.  1 kit  0  . guaiFENesin-dextromethorphan (ROBITUSSIN DM) 100-10 MG/5ML syrup Take 5 mLs by mouth every 4 (four) hours as needed for cough.  118 mL  0  . insulin aspart (NOVOLOG FLEXPEN) 100 UNIT/ML injection Inject 3-5 Units into the skin 3 (three) times daily before meals.  3 vial  4     Allergies: Allergies as of 06/27/2014 - Review Complete 06/27/2014  Allergen Reaction Noted  . Ace inhibitors  07/31/2006  . Percocet [oxycodone-acetaminophen] Itching 06/27/2014  . Statins  03/05/2013  . Sulfamethoxazole Rash 06/27/2014  . Sulfites Rash 03/04/2012   Past Medical History  Diagnosis Date  . ANA POSITIVE 02/09/2010  . Type 2 diabetes mellitus   . Hyperlipidemia   . Dizziness     Multiple falls at home  . Diabetic neuropathy     Of hands and feet  . Sickle cell trait   . Fibromyalgia   . Hypothyroidism   . Deafness in right ear 2012    in the process of treatment  . Anal warts 11/09/2012   Past Surgical History  Procedure Laterality Date  . Appendectomy  1986  . Cesarean section  1986, 1993   Family History  Problem Relation Age of Onset  . Emphysema Mother   . Cancer Mother     unknown  . Hypertension Mother   . Cancer Father     pancreatic   . Diabetes Father   . Hypertension Father   . Down syndrome Brother   . Cancer Maternal Aunt     cancer  . Cancer Other     breast cancer aunt  . Stroke Sister   . Diabetes Sister   . Colon cancer Neg Hx   . Stomach cancer Neg Hx    History   Social History  . Marital Status: Single    Spouse Name: N/A    Number of Children: N/A  . Years of Education: 12   Occupational History  .     Social  History Main Topics  . Smoking status: Former Smoker -- 5 years    Types: Cigarettes    Quit date: 09/30/1990  . Smokeless tobacco: Never Used  . Alcohol Use:  No  . Drug Use: No  . Sexual Activity: Not on file   Other Topics Concern  . Not on file   Social History Narrative   Lives in [place], alone. Looking for a job. Has a son in town who checks in on her.     Review of Systems: Constitutional: +fevers/chills Eyes: no vision changes Ears, nose, mouth, throat, and face: no cough Respiratory: +shortness of breath Cardiovascular: +chest pain Gastrointestinal: +chronic nausea, no vomiting, no abdominal pain, +constipation, no diarrhea Genitourinary: +dysuria, no hematuria Integument: no rash Hematologic/lymphatic: no bleeding/bruising, no edema Musculoskeletal: +fibromyalgia Neurological: +neuropathy, no weakness  Physical Exam: Blood pressure 128/59, pulse 60, temperature 99 F (37.2 C), temperature source Oral, resp. rate 16, height 5' 1.81" (1.57 m), weight 185 lb 6.5 oz (84.1 kg), SpO2 100.00%. Vitals reviewed. General: resting in bed, mild distress secondary to pain HEENT: [place]/at, no scleral icterus Cardiac: RRR, no rubs, murmurs or gallops, reproducible left chest wall pain to palpation (mild to moderate) Pulm: clear to auscultation bilaterally, no wheezes, rales, or rhonchi Abd: soft, nontender, nondistended, BS present Ext: warm and well perfused, no pedal edema, left groin/medial and posterior thigh with 10 x 6 cm mass moderate ttp with induration. No erythema or drainage. Neuro: alert and oriented X3, cranial nerves II-XII grossly intact  Lab results: Basic Metabolic Panel:  Recent Labs  06/27/14 1725  NA 127*  K 4.3  CL 89*  CO2 19  GLUCOSE 374*  BUN 19  CREATININE 1.52*  CALCIUM 10.2   Liver Function Tests:  Recent Labs  06/27/14 1725  AST 30  ALT 26  ALKPHOS 311*  BILITOT 4.8*  PROT 8.1  ALBUMIN 2.7*   CBC:  Recent Labs   06/27/14 1725  WBC 30.1*  NEUTROABS 27.1*  HGB 10.9*  HCT 30.4*  MCV 86.4  PLT 212   Cardiac Enzymes:  Recent Labs  06/27/14 1725  TROPONINI <0.30   CBG:  Recent Labs  06/27/14 1505 06/27/14 1814  GLUCAP 384* 447*   Urine Drug Screen: Drugs of Abuse     Component Value Date/Time   LABOPIA NONE DETECTED 08/18/2012 0026   COCAINSCRNUR NONE DETECTED 08/18/2012 0026   LABBENZ NONE DETECTED 08/18/2012 0026   AMPHETMU NONE DETECTED 08/18/2012 0026   THCU NONE DETECTED 08/18/2012 0026   LABBARB NONE DETECTED 08/18/2012 0026    Urinalysis: No results found for this basename: COLORURINE, APPERANCEUR, LABSPEC, PHURINE, GLUCOSEU, HGBUR, BILIRUBINUR, KETONESUR, PROTEINUR, UROBILINOGEN, NITRITE, LEUKOCYTESUR,  in the last 72 hours  Imaging results:  Ct Pelvis W Contrast  06/27/2014   CLINICAL DATA:  LEFT groin abscess.  Medial posterior thigh abscess.  EXAM: CT PELVIS WITH CONTRAST  TECHNIQUE: Multidetector CT imaging of the pelvis was performed using the standard protocol following the bolus administration of intravenous contrast.  CONTRAST:  14m OMNIPAQUE IOHEXOL 300 MG/ML  SOLN  COMPARISON:  None.  FINDINGS: Cellulitis of the medial proximal LEFT thigh with reactive LEFT inguinal adenopathy. Borderline LEFT external iliac lymph nodes are present, also likely reactive. Visceral pelvis appears within normal limits. No aggressive osseous lesions. No gas within the soft tissues.  IMPRESSION: Cellulitis of the medial proximal LEFT thigh extending to the perineum. No abscess.   Electronically Signed   By: GDereck LigasM.D.   On: 06/27/2014 20:02    Other results: EKG: sinus tachycarida, normal intervals and axis. No acute changes  Assessment & Plan by Problem: 53y.o female with worsening left thigh mass associated with pain and increased  size without drainage.    #Sepsis secondary to Cellulitis of the medial proximal left thigh extending to the perineium -Vancomycin and Zosyn  per pharmacy  -see resulted CT pelvis with contrast  -prn Norco/Vicodin 5-325 mg q6 prn, NS 125 cc/hr    #DM type 2, uncontrolled, with severe neuropathy with hyperglycemia  -hyperglycemic 384, 447 -SSI-M q 4 hours, Lantus 20 u qhs (reduced from 20 u bid home dose) due to decreased oral intake -will monitor cbgs  -pending HA1C last checked (02/23/14 9.3), will check UA  #Acute on chronic CKD -Etiology could be pre-renal due to decreased oral intake with use of ARB -will trend BMET, monitor strict intake and output  -check FENA -hydrate with IVF NS 125 cc/hr  -avoid nephrotoxic drugs (i.e hold ARB on Benicar 40 mg at home)  #Essential hypertension, benign -Currently normotensive  -Hold ARB in AKI -monitor BP  #Dysuria -will check UA, urine culture to r/o UTI  #Chest pain -reproducible on exam etiology could be MSK.   -will try Protonix 40 meq qd to treat for GERD symptoms  -EKG (mild sinus tachycardia), troponin negative x 1   #Unspecified constipation -Senna-S qhs  #HYPOTHYROIDISM -Continued Synthyroid 100 mcg qd   #Fibromyalgia syndrome -stable no acute issues -Continue Cymbalta 60 mg daily, Neurontin 600 mg qid (will confirm with pharmacy this is dose)  #headache -prn Tylenol 325 daily  #Nausea -prn Zofran q6 hours  #insomina -continued home Ambien 5 mg qhs   #F/E/N -NS 125 cc/hr  -Hyponatremia 127.  Will work up with plasma os and urine os.  HypoNa could be 2/2 to decreased oral intake.  BMET in the am  -H/H carb modified diet   #DVT px  -Lovenox, scds  Dispo: Disposition is deferred at this time, awaiting improvement of current medical problems. Anticipated discharge in approximately 3-5 day(s).   The patient does have a current PCP Kayla Moore, MD) and does need an [facility] hospital follow-up appointment after discharge.  The patient does not have transportation limitations that hinder transportation to clinic appointments.  Signed: Jacques Earthly,  MD 06/27/2014, 8:32 PM

## 2014-06-27 NOTE — Progress Notes (Signed)
Kayla [name] [id number] Code Status: full   Admission Data: 06/27/2014 5:30 PM Attending Provider:  Daryll Drown PCP:Ngo, Purcell Nails, MD Consults/ Treatment Team:    [name] is a 53 y.o. female patient admitted from ED awake, alert - oriented  X 3 - no acute distress noted.  VSS - Blood pressure 128/59, pulse 60, temperature 99 F (37.2 C), temperature source Oral, resp. rate 16, height 5' 1.81" (1.57 m), weight 84.1 kg (185 lb 6.5 oz), SpO2 100.00%.    IV in place, occlusive dsg intact without redness.  Orientation to room, and floor completed with information packet given to patient/family.  Patient declined safety video at this time.  Admission INP armband ID verified with patient/family, and in place.   SR up x 2, fall assessment complete, with patient and family able to verbalize understanding of risk associated with falls, and verbalized understanding to call nsg before up out of bed.  Call light within reach, patient able to voice, and demonstrate understanding.  Skin, clean-dry- intact without evidence of bruising, or skin tears.   No evidence of skin break down noted on exam.     Will cont to eval and treat per MD orders.  Gracy Bruins, RN 06/27/2014 5:30 PM

## 2014-06-27 NOTE — Progress Notes (Signed)
INTERNAL MEDICINE TEACHING ATTENDING ADDENDUM - Kayla Contes, MD: I personally saw and evaluated Kayla [name] in this clinic visit in conjunction with the resident, Dr. Denton Brick. I have discussed patient's plan of care with medical resident during this visit. I have confirmed the physical exam findings and have read and agree with the clinic note including the plan with the following addition: - Pt with large left groin/thigh abscess - Will admit for further w/u including imaging and I and D and abx

## 2014-06-28 DIAGNOSIS — E871 Hypo-osmolality and hyponatremia: Secondary | ICD-10-CM | POA: Diagnosis present

## 2014-06-28 LAB — BASIC METABOLIC PANEL
Anion gap: 13 (ref 5–15)
Anion gap: 15 (ref 5–15)
BUN: 23 mg/dL (ref 6–23)
BUN: 29 mg/dL — ABNORMAL HIGH (ref 6–23)
CO2: 19 mEq/L (ref 19–32)
CO2: 21 mEq/L (ref 19–32)
Calcium: 9.3 mg/dL (ref 8.4–10.5)
Calcium: 10.1 mg/dL (ref 8.4–10.5)
Chloride: 91 mEq/L — ABNORMAL LOW (ref 96–112)
Chloride: 90 mEq/L — ABNORMAL LOW (ref 96–112)
Creatinine, Ser: 1.56 mg/dL — ABNORMAL HIGH (ref 0.50–1.10)
Creatinine, Ser: 1.96 mg/dL — ABNORMAL HIGH (ref 0.50–1.10)
GFR calc Af Amer: 43 mL/min — ABNORMAL LOW (ref >90)
GFR calc Af Amer: 33 mL/min — ABNORMAL LOW (ref >90)
GFR calc non Af Amer: 37 mL/min — ABNORMAL LOW (ref >90)
GFR calc non Af Amer: 28 mL/min — ABNORMAL LOW (ref >90)
Glucose, Bld: 220 mg/dL — ABNORMAL HIGH (ref 70–99)
Glucose, Bld: 251 mg/dL — ABNORMAL HIGH (ref 70–99)
Potassium: 4 mEq/L (ref 3.7–5.3)
Potassium: 4.1 mEq/L (ref 3.7–5.3)
Sodium: 123 mEq/L — ABNORMAL LOW (ref 137–147)
Sodium: 126 mEq/L — ABNORMAL LOW (ref 137–147)

## 2014-06-28 LAB — OSMOLALITY
Osmolality: 285 mOsm/kg (ref 275–300)
Osmolality: 275 mOsm/kg (ref 275–300)

## 2014-06-28 LAB — CBC WITH DIFFERENTIAL/PLATELET
Basophils Absolute: 0 10*3/uL (ref 0.0–0.1)
Basophils Relative: 0 % (ref 0–1)
Eosinophils Absolute: 0 10*3/uL (ref 0.0–0.7)
Eosinophils Relative: 0 % (ref 0–5)
HCT: 25 % — ABNORMAL LOW (ref 36.0–46.0)
Hemoglobin: 8.9 g/dL — ABNORMAL LOW (ref 12.0–15.0)
Lymphocytes Relative: 5 % — ABNORMAL LOW (ref 12–46)
Lymphs Abs: 1.5 10*3/uL (ref 0.7–4.0)
MCH: 30.3 pg (ref 26.0–34.0)
MCHC: 35.6 g/dL (ref 30.0–36.0)
MCV: 85 fL (ref 78.0–100.0)
Monocytes Absolute: 1.8 10*3/uL — ABNORMAL HIGH (ref 0.1–1.0)
Monocytes Relative: 6 % (ref 3–12)
Neutro Abs: 26.9 10*3/uL — ABNORMAL HIGH (ref 1.7–7.7)
Neutrophils Relative %: 89 % — ABNORMAL HIGH (ref 43–77)
Platelets: 191 10*3/uL (ref 150–400)
RBC: 2.94 MIL/uL — ABNORMAL LOW (ref 3.87–5.11)
RDW: 13.8 % (ref 11.5–15.5)
WBC Morphology: INCREASED
WBC: 30.2 10*3/uL — ABNORMAL HIGH (ref 4.0–10.5)

## 2014-06-28 LAB — COMPREHENSIVE METABOLIC PANEL
ALT: 24 U/L (ref 0–35)
AST: 31 U/L (ref 0–37)
Albumin: 2.2 g/dL — ABNORMAL LOW (ref 3.5–5.2)
Alkaline Phosphatase: 272 U/L — ABNORMAL HIGH (ref 39–117)
Anion gap: 15 (ref 5–15)
BUN: 24 mg/dL — ABNORMAL HIGH (ref 6–23)
CO2: 20 mEq/L (ref 19–32)
Calcium: 9.6 mg/dL (ref 8.4–10.5)
Chloride: 93 mEq/L — ABNORMAL LOW (ref 96–112)
Creatinine, Ser: 1.66 mg/dL — ABNORMAL HIGH (ref 0.50–1.10)
GFR calc Af Amer: 40 mL/min — ABNORMAL LOW (ref >90)
GFR calc non Af Amer: 34 mL/min — ABNORMAL LOW (ref >90)
Glucose, Bld: 196 mg/dL — ABNORMAL HIGH (ref 70–99)
Potassium: 4.4 mEq/L (ref 3.7–5.3)
Sodium: 128 mEq/L — ABNORMAL LOW (ref 137–147)
Total Bilirubin: 4.5 mg/dL — ABNORMAL HIGH (ref 0.3–1.2)
Total Protein: 7.1 g/dL (ref 6.0–8.3)

## 2014-06-28 LAB — GLUCOSE, CAPILLARY
Glucose-Capillary: 225 mg/dL — ABNORMAL HIGH (ref 70–99)
Glucose-Capillary: 225 mg/dL — ABNORMAL HIGH (ref 70–99)
Glucose-Capillary: 222 mg/dL — ABNORMAL HIGH (ref 70–99)
Glucose-Capillary: 198 mg/dL — ABNORMAL HIGH (ref 70–99)
Glucose-Capillary: 279 mg/dL — ABNORMAL HIGH (ref 70–99)
Glucose-Capillary: 350 mg/dL — ABNORMAL HIGH (ref 70–99)

## 2014-06-28 LAB — BILIRUBIN, FRACTIONATED(TOT/DIR/INDIR)
Bilirubin, Direct: 2 mg/dL — ABNORMAL HIGH (ref 0.0–0.3)
Indirect Bilirubin: 2.5 mg/dL — ABNORMAL HIGH (ref 0.3–0.9)
Total Bilirubin: 4.5 mg/dL — ABNORMAL HIGH (ref 0.3–1.2)

## 2014-06-28 LAB — LACTIC ACID, PLASMA: Lactic Acid, Venous: 1.3 mmol/L (ref 0.5–2.2)

## 2014-06-28 LAB — URINE CULTURE: Colony Count: 2000

## 2014-06-28 LAB — HEMOGLOBIN A1C
Hgb A1c MFr Bld: 9.7 % — ABNORMAL HIGH (ref <5.7)
Mean Plasma Glucose: 232 mg/dL — ABNORMAL HIGH (ref <117)

## 2014-06-28 LAB — SODIUM, URINE, RANDOM: Sodium, Ur: 40 mEq/L

## 2014-06-28 LAB — OSMOLALITY, URINE: Osmolality, Ur: 344 mOsm/kg — ABNORMAL LOW (ref 390–1090)

## 2014-06-28 MED ORDER — INSULIN GLARGINE 100 UNIT/ML ~~LOC~~ SOLN
30.0000 [IU] | Freq: Every day | SUBCUTANEOUS | Status: DC
  Administered 2014-06-29: 30 [IU] via SUBCUTANEOUS
  Filled 2014-06-28 (×3): qty 0.3

## 2014-06-28 MED ORDER — BISACODYL 10 MG RE SUPP
10.0000 mg | Freq: Once | RECTAL | Status: AC
  Administered 2014-06-28: 10 mg via RECTAL
  Filled 2014-06-28: qty 1

## 2014-06-28 MED ORDER — SODIUM CHLORIDE 0.9 % IV SOLN
INTRAVENOUS | Status: AC
  Administered 2014-06-28: 125 mL/h via INTRAVENOUS

## 2014-06-28 NOTE — Progress Notes (Signed)
Utilization review completed.  

## 2014-06-28 NOTE — Progress Notes (Signed)
Inpatient Diabetes Program Recommendations  AACE/ADA: New Consensus Statement on Inpatient Glycemic Control (2013)  Target Ranges:  Prepandial:   less than 140 mg/dL      Peak postprandial:   less than 180 mg/dL (1-2 hours)      Critically ill patients:  140 - 180 mg/dL   Results for JUDENE, GAHN (MRN [id number]) as of 06/28/2014 14:33  Ref. Range 06/27/2014 15:05 06/27/2014 18:14 06/27/2014 21:57 06/28/2014 01:34 06/28/2014 06:01 06/28/2014 07:45 06/28/2014 12:00  Glucose-Capillary Latest Range: 70-99 mg/dL 384 (H) 447 (H) 307 (H) 225 (H) 225 (H) 222 (H) 198 (H)  Results for MIKIA, COMO (MRN [id number]) as of 06/28/2014 14:33  Ref. Range 06/27/2014 17:25  Hemoglobin A1C Latest Range: <5.7 % 9.7 (H)   Diabetes history: DM2 Outpatient Diabetes medications: Lantus 20 units BID, Novolog 3-5 units TID with meals Current orders for Inpatient glycemic control: Novolog 0-15 units Q4H, Lantus 20 units QHS  Inpatient Diabetes Program Recommendations Insulin - Basal: Please increase Lantus to 30 units QHS (based on 83 kg x 0.35 units).  Note: CBGs have ranged from 198-447 mg/dl and patient has received a total of Novolog 44 units for correction since patient arrived at the hospital. Please consider increasing basal insulin to Lantus 30 units QHS.   Thanks, Barnie Alderman, RN, MSN, CCRN Diabetes Coordinator Inpatient Diabetes Program [phone] (Team Pager) [phone] ([facility] office) [phone] [facility] office)

## 2014-06-28 NOTE — Progress Notes (Signed)
Subjective: [name] reports her leg pain is improved. She denies chest pain or shortness of breath. No nausea or vomiting. She denies abdominal pain.  Objective: Vital signs in last 24 hours: Filed Vitals:   06/28/14 0000 06/28/14 0600 06/28/14 0602 06/28/14 0700  BP:   97/50   Pulse:   82   Temp:   98.1 F (36.7 C)   TempSrc:   Oral   Resp: 15 17 16 19   Height:      Weight:   183 lb 1.6 oz (83.054 kg)   SpO2:   98%    Weight change:   Intake/Output Summary (Last 24 hours) at 06/28/14 1150 Last data filed at 06/28/14 1020  Gross per 24 hour  Intake 627.92 ml  Output      0 ml  Net 627.92 ml   General: NAD HEENT: [place]/at, +scleral icterus  Cardiac: RRR, no rubs, murmurs or gallops, reproducible left chest wall pain to palpation  Pulm: clear to auscultation bilaterally, no wheezes, rales, or rhonchi  Abd: soft, tender to palpation in LUQ, nondistended, BS present  Ext: warm and well perfused, no pedal edema, left groin/medial and posterior thigh with 10 x 6 cm mass moderate ttp with induration. No erythema or drainage.  Neuro: alert and oriented X3, cranial nerves II-XII grossly intact  Lab Results: Basic Metabolic Panel:  Recent Labs Lab 06/28/14 0105 06/28/14 0402  NA 123* 128*  K 4.0 4.4  CL 91* 93*  CO2 19 20  GLUCOSE 220* 196*  BUN 23 24*  CREATININE 1.56* 1.66*  CALCIUM 9.3 9.6   Liver Function Tests:  Recent Labs Lab 06/27/14 1725 06/28/14 0402  AST 30 31  ALT 26 24  ALKPHOS 311* 272*  BILITOT 4.8* 4.5*  4.5*  PROT 8.1 7.1  ALBUMIN 2.7* 2.2*   CBC:  Recent Labs Lab 06/27/14 1725 06/28/14 0402  WBC 30.1* 30.2*  NEUTROABS 27.1* 26.9*  HGB 10.9* 8.9*  HCT 30.4* 25.0*  MCV 86.4 85.0  PLT 212 191   Cardiac Enzymes:  Recent Labs Lab 06/27/14 1725  TROPONINI <0.30   CBG:  Recent Labs Lab 06/27/14 1505 06/27/14 1814 06/27/14 2157 06/28/14 0134 06/28/14 0601 06/28/14 0745  GLUCAP 384* 447* 307* 225* 225* 222*   Hemoglobin  A1C:  Recent Labs Lab 06/27/14 1725  HGBA1C 9.7*   Urinalysis:  Recent Labs Lab 06/27/14 2234  COLORURINE AMBER*  LABSPEC 1.031*  PHURINE 5.5  GLUCOSEU >1000*  HGBUR SMALL*  BILIRUBINUR SMALL*  KETONESUR 15*  PROTEINUR 100*  UROBILINOGEN 1.0  NITRITE POSITIVE*  LEUKOCYTESUR NEGATIVE   Misc. Labs: Osmolality 275 Lactic acid 1.3 Urine Osmolality 344 Urine Sodium 40 Urine Creatinine 98.7  Micro Results: Recent Results (from the past 240 hour(s))  CULTURE, BLOOD (ROUTINE X 2)     Status: None   Collection Time    06/27/14  4:55 PM      Result Value Ref Range Status   Specimen Description BLOOD RIGHT ARM   Final   Special Requests BOTTLES DRAWN AEROBIC AND ANAEROBIC 5CC   Final   Culture  Setup Time     Final   Value: 06/27/2014 22:09     Performed at [facility]   Culture     Final   Value:        BLOOD CULTURE RECEIVED NO GROWTH TO DATE CULTURE WILL BE HELD FOR 5 DAYS BEFORE ISSUING A FINAL NEGATIVE REPORT     Performed at [facility]  Report Status PENDING   Incomplete   Studies/Results: Ct Pelvis W Contrast  06/27/2014   CLINICAL DATA:  LEFT groin abscess.  Medial posterior thigh abscess.  EXAM: CT PELVIS WITH CONTRAST  TECHNIQUE: Multidetector CT imaging of the pelvis was performed using the standard protocol following the bolus administration of intravenous contrast.  CONTRAST:  40mL OMNIPAQUE IOHEXOL 300 MG/ML  SOLN  COMPARISON:  None.  FINDINGS: Cellulitis of the medial proximal LEFT thigh with reactive LEFT inguinal adenopathy. Borderline LEFT external iliac lymph nodes are present, also likely reactive. Visceral pelvis appears within normal limits. No aggressive osseous lesions. No gas within the soft tissues.  IMPRESSION: Cellulitis of the medial proximal LEFT thigh extending to the perineum. No abscess.   Electronically Signed   By: Dereck Ligas M.D.   On: 06/27/2014 20:02   Medications: I have reviewed the patient's current  medications. Scheduled Meds: . DULoxetine  60 mg Oral Daily  . enoxaparin (LOVENOX) injection  40 mg Subcutaneous Q24H  . gabapentin  600 mg Oral QID  . insulin aspart  0-15 Units Subcutaneous Q4H  . insulin glargine  20 Units Subcutaneous QHS  . levothyroxine  100 mcg Oral QAC breakfast  . pantoprazole  40 mg Oral Daily  . piperacillin-tazobactam (ZOSYN)  IV  3.375 g Intravenous 3 times per day  . senna-docusate  1 tablet Oral QHS  . sodium chloride  3 mL Intravenous Q12H  . vancomycin  750 mg Intravenous Q12H  . zolpidem  5 mg Oral QHS   Continuous Infusions: . sodium chloride 125 mL/hr (06/28/14 0900)   PRN Meds:.acetaminophen, HYDROcodone-acetaminophen, naphazoline-pheniramine, ondansetron Assessment/Plan: Principal Problem:   Sepsis Active Problems:   HYPOTHYROIDISM   DM type 2, uncontrolled, with severe neuropathy   Fibromyalgia syndrome   Essential hypertension, benign   Dysuria   Unspecified constipation   Cellulitis of groin   Lymphadenopathy   Hyperglycemia   Unspecified protein-calorie malnutrition   Liver function test abnormality   Acute renal failure superimposed on stage 3 chronic kidney disease   Hyponatremia  #Sepsis secondary to cellulitis of the medial proximal left thigh extending to the perineum  -continue zosyn 3.375g TID -continue vanc 750mg  BID -prn Norco/Vicodin 5-325 mg q6 prn, NS @ 125cc/hr   #DM type 2, uncontrolled, with severe neuropathy with hyperglycemia: BG 196-225. Hgb A1c 9.7 -SSI-M q 4 hours, Lantus 20 u qhs    #Acute on chronic CKD: FENa 0.5% - prerenal. Cr 1.66 from 1.56 -hydrate with IVF NS 125 cc/hr  -continue to hold home olmesartan   #Hyperbilirubinemia: elevation of both unconjugated and conjugated (direct) bilirubin -US Abdomen  #Essential hypertension, benign  -Currently normotensive  -Hold ARB in AKI  -monitor BP   #UTI: Positive nitrites, 7-10 WBC, many bacteria on UA  -continue abx as above -f/u UCx  #Chest  pain: likely MSK -Protonix 40 meq qd  #Unspecified constipation  -Senna-S qhs  -Dulcolax suppository x 1  #Hypothyroidism -Continue Synthyroid 100 mcg qd   #Fibromyalgia  -Continue Cymbalta 60 mg daily, Neurontin 600 mg qid  #Headache  -prn Tylenol 325 daily   #Nausea  -prn Zofran q6 hours   #Insomina  -continued home Ambien 5 mg qhs   FEN: Corrected sodium 130 -NS 125 cc/hr  -H/H carb modified diet   DVT ppx  -Lovenox, scds  Dispo: Disposition is deferred at this time, awaiting improvement of current medical problems.  Anticipated discharge in approximately 2 day(s).   The patient does have a current PCP [facility]  Raelene Bott, MD) and does need an [facility] hospital follow-up appointment after discharge.  The patient does not have transportation limitations that hinder transportation to clinic appointments.  .Services Needed at time of discharge: Y = Yes, Blank = No PT:   OT:   RN:   Equipment:   Other:     LOS: 1 day   Jacques Earthly, MD 06/28/2014, 11:50 AM

## 2014-06-28 NOTE — H&P (Signed)
  Date: 06/28/2014  Patient name: Danaria Larsen  Medical record number: [id number]  Date of birth: [birth year]   I have seen and evaluated Laverle Hobby and discussed their care with the Residency Team.  Briefly, Ms. Bacigalupi is a 53yo woman who presented with cellulitis of the left leg which on CT scan was shown to not be complicated by abscess formation.  She was admitted for sepsis given her vital signs and elevated WBC and treated with IV Abx.  UA was also concerning for a UTI.  On lab work, she was found to have elevated LFTs which are not unusual for her, but these were worse than previous.  She also had abdominal pain to palpation and mild jaundice.   Assessment and Plan: I have seen and evaluated the patient as outlined above. I agree with the formulated Assessment and Plan as detailed in the residents' admission note, with the following changes:   1. Sepsis 2/2 cellulitis and UTI - Vanc and Zosyn, BC and UC pending  - WBC 30, will monitor for improvement - Pain control with Norco  2. AKI on CKD with hyponatremia - Possibly due to decreased PO intake in the setting of #1, but also with DM and HTN which could cause intrinsic disease.  - Assess for cause with FENA, consider renal ultrasound if not improving.  - Trend BMET - Fluids as noted in resident note  3. Elevated Tbili, Alk phos and Jaundice - Trend - RUQ ultrasound to start the work up for obstructive pathology  4. Uncontrolled DM2 - Agree with plan for insulin  - Will uptitrate as needed based on CBGs  Other issues per resident note.   Sid Falcon, MD 9/29/201511:35 AM

## 2014-06-29 ENCOUNTER — Inpatient Hospital Stay (HOSPITAL_COMMUNITY): Payer: Self-pay

## 2014-06-29 DIAGNOSIS — N390 Urinary tract infection, site not specified: Secondary | ICD-10-CM

## 2014-06-29 DIAGNOSIS — R748 Abnormal levels of other serum enzymes: Secondary | ICD-10-CM

## 2014-06-29 DIAGNOSIS — E1169 Type 2 diabetes mellitus with other specified complication: Secondary | ICD-10-CM

## 2014-06-29 DIAGNOSIS — R112 Nausea with vomiting, unspecified: Secondary | ICD-10-CM

## 2014-06-29 DIAGNOSIS — E1149 Type 2 diabetes mellitus with other diabetic neurological complication: Secondary | ICD-10-CM

## 2014-06-29 DIAGNOSIS — R17 Unspecified jaundice: Secondary | ICD-10-CM

## 2014-06-29 LAB — BASIC METABOLIC PANEL
Anion gap: 15 (ref 5–15)
BUN: 33 mg/dL — ABNORMAL HIGH (ref 6–23)
CO2: 21 mEq/L (ref 19–32)
Calcium: 10 mg/dL (ref 8.4–10.5)
Chloride: 91 mEq/L — ABNORMAL LOW (ref 96–112)
Creatinine, Ser: 2.31 mg/dL — ABNORMAL HIGH (ref 0.50–1.10)
GFR calc Af Amer: 27 mL/min — ABNORMAL LOW (ref >90)
GFR calc non Af Amer: 23 mL/min — ABNORMAL LOW (ref >90)
Glucose, Bld: 85 mg/dL (ref 70–99)
Potassium: 3.7 mEq/L (ref 3.7–5.3)
Sodium: 127 mEq/L — ABNORMAL LOW (ref 137–147)

## 2014-06-29 LAB — COMPREHENSIVE METABOLIC PANEL
ALT: 80 U/L — ABNORMAL HIGH (ref 0–35)
AST: 117 U/L — ABNORMAL HIGH (ref 0–37)
Albumin: 1.9 g/dL — ABNORMAL LOW (ref 3.5–5.2)
Alkaline Phosphatase: 255 U/L — ABNORMAL HIGH (ref 39–117)
Anion gap: 16 — ABNORMAL HIGH (ref 5–15)
BUN: 33 mg/dL — ABNORMAL HIGH (ref 6–23)
CO2: 18 mEq/L — ABNORMAL LOW (ref 19–32)
Calcium: 9.1 mg/dL (ref 8.4–10.5)
Chloride: 91 mEq/L — ABNORMAL LOW (ref 96–112)
Creatinine, Ser: 2.35 mg/dL — ABNORMAL HIGH (ref 0.50–1.10)
GFR calc Af Amer: 26 mL/min — ABNORMAL LOW (ref >90)
GFR calc non Af Amer: 23 mL/min — ABNORMAL LOW (ref >90)
Glucose, Bld: 206 mg/dL — ABNORMAL HIGH (ref 70–99)
Potassium: 3.7 mEq/L (ref 3.7–5.3)
Sodium: 125 mEq/L — ABNORMAL LOW (ref 137–147)
Total Bilirubin: 2.6 mg/dL — ABNORMAL HIGH (ref 0.3–1.2)
Total Protein: 6.7 g/dL (ref 6.0–8.3)

## 2014-06-29 LAB — CBC WITH DIFFERENTIAL/PLATELET
Basophils Absolute: 0 10*3/uL (ref 0.0–0.1)
Basophils Relative: 0 % (ref 0–1)
Eosinophils Absolute: 0 10*3/uL (ref 0.0–0.7)
Eosinophils Relative: 0 % (ref 0–5)
HCT: 23.2 % — ABNORMAL LOW (ref 36.0–46.0)
Hemoglobin: 8.1 g/dL — ABNORMAL LOW (ref 12.0–15.0)
Lymphocytes Relative: 4 % — ABNORMAL LOW (ref 12–46)
Lymphs Abs: 1.4 10*3/uL (ref 0.7–4.0)
MCH: 30.6 pg (ref 26.0–34.0)
MCHC: 34.9 g/dL (ref 30.0–36.0)
MCV: 87.5 fL (ref 78.0–100.0)
Monocytes Absolute: 2.1 10*3/uL — ABNORMAL HIGH (ref 0.1–1.0)
Monocytes Relative: 6 % (ref 3–12)
Neutro Abs: 31.7 10*3/uL — ABNORMAL HIGH (ref 1.7–7.7)
Neutrophils Relative %: 90 % — ABNORMAL HIGH (ref 43–77)
Platelets: 207 10*3/uL (ref 150–400)
RBC: 2.65 MIL/uL — ABNORMAL LOW (ref 3.87–5.11)
RDW: 14.2 % (ref 11.5–15.5)
WBC Morphology: INCREASED
WBC: 35.2 10*3/uL — ABNORMAL HIGH (ref 4.0–10.5)

## 2014-06-29 LAB — GLUCOSE, CAPILLARY
Glucose-Capillary: 111 mg/dL — ABNORMAL HIGH (ref 70–99)
Glucose-Capillary: 126 mg/dL — ABNORMAL HIGH (ref 70–99)
Glucose-Capillary: 137 mg/dL — ABNORMAL HIGH (ref 70–99)
Glucose-Capillary: 143 mg/dL — ABNORMAL HIGH (ref 70–99)
Glucose-Capillary: 222 mg/dL — ABNORMAL HIGH (ref 70–99)
Glucose-Capillary: 260 mg/dL — ABNORMAL HIGH (ref 70–99)
Glucose-Capillary: 352 mg/dL — ABNORMAL HIGH (ref 70–99)

## 2014-06-29 MED ORDER — SODIUM CHLORIDE 0.9 % IV SOLN
INTRAVENOUS | Status: AC
  Administered 2014-06-29 (×3): 125 mL/h via INTRAVENOUS
  Administered 2014-06-30: 07:00:00 via INTRAVENOUS

## 2014-06-29 MED ORDER — INSULIN ASPART 100 UNIT/ML ~~LOC~~ SOLN
0.0000 [IU] | Freq: Three times a day (TID) | SUBCUTANEOUS | Status: DC
  Administered 2014-06-29 – 2014-06-30 (×3): 3 [IU] via SUBCUTANEOUS
  Administered 2014-07-01: 1 [IU] via SUBCUTANEOUS
  Administered 2014-07-02 (×2): 3 [IU] via SUBCUTANEOUS

## 2014-06-29 MED ORDER — INSULIN GLARGINE 100 UNIT/ML ~~LOC~~ SOLN
40.0000 [IU] | Freq: Every day | SUBCUTANEOUS | Status: DC
  Administered 2014-06-29 – 2014-07-01 (×2): 40 [IU] via SUBCUTANEOUS
  Filled 2014-06-29 (×4): qty 0.4

## 2014-06-29 MED ORDER — CLINDAMYCIN HCL 300 MG PO CAPS
300.0000 mg | ORAL_CAPSULE | Freq: Four times a day (QID) | ORAL | Status: DC
  Administered 2014-06-29 – 2014-07-01 (×8): 300 mg via ORAL
  Filled 2014-06-29 (×12): qty 1

## 2014-06-29 MED ORDER — POLYETHYLENE GLYCOL 3350 17 G PO PACK
17.0000 g | PACK | Freq: Every day | ORAL | Status: DC
  Administered 2014-06-29 – 2014-07-06 (×8): 17 g via ORAL
  Filled 2014-06-29 (×8): qty 1

## 2014-06-29 MED ORDER — ENOXAPARIN SODIUM 30 MG/0.3ML ~~LOC~~ SOLN
30.0000 mg | SUBCUTANEOUS | Status: DC
  Administered 2014-06-29 – 2014-07-01 (×3): 30 mg via SUBCUTANEOUS
  Filled 2014-06-29 (×4): qty 0.3

## 2014-06-29 MED ORDER — GABAPENTIN 300 MG PO CAPS
300.0000 mg | ORAL_CAPSULE | Freq: Four times a day (QID) | ORAL | Status: DC
  Administered 2014-06-29 – 2014-07-06 (×26): 300 mg via ORAL
  Filled 2014-06-29 (×32): qty 1

## 2014-06-29 NOTE — Progress Notes (Signed)
  Date: 06/29/2014  Patient name: Kayla [name]  Medical record number: [id number]  Date of birth: 14-Apr-1961   This patient has been seen and the plan of care was discussed with the house staff. Please see their note for complete details. I concur with their findings with the following additions/corrections:  Patient's cellulitis seems to be improving.  UC not impressive for actual infection.  Agree with narrowing of antibiotics.  ALT/AST now elevated, Bilirubin improved.  Abd [place] did not show any obstruction, but possibly a spot on the pancreas (? Bowel gas).  Would check viral hepatitis panels and consider checking for hemochromotosis.  NAFLD would likely have shown up on the ultrasound.  Due to renal function, she cannot have a CT Abdomen at this time.   Sid Falcon, MD 06/29/2014, 3:32 PM

## 2014-06-29 NOTE — Progress Notes (Signed)
Subjective: Kayla [name] reports her leg pain is improved. She denies chest pain or shortness of breath. No nausea or vomiting. She denies abdominal pain.  Objective: Vital signs in last 24 hours: Filed Vitals:   06/28/14 2000 06/28/14 2124 06/29/14 0140 06/29/14 0504  BP:  116/60  104/58  Pulse:  91  94  Temp:  98.2 F (36.8 C)  98.2 F (36.8 C)  TempSrc:  Oral  Oral  Resp: 19 18 20 18   Height:      Weight:    182 lb 8.7 oz (82.8 kg)  SpO2:  94%  98%   Weight change: -2 lb 13.9 oz (-1.3 kg)  Intake/Output Summary (Last 24 hours) at 06/29/14 1120 Last data filed at 06/29/14 0911  Gross per 24 hour  Intake 1519.16 ml  Output      0 ml  Net 1519.16 ml   General: NAD HEENT: [place]/at, anicteric sclera  Cardiac: RRR, no rubs, murmurs or gallops  Pulm: clear to auscultation bilaterally, no wheezes, rales, or rhonchi  Abd: soft, tender to palpation in LLQ, nondistended, BS present  Ext: warm and well perfused, no pedal edema, left groin/medial and posterior thigh with 10 x 6 cm mass moderate ttp with decreased induration. No erythema or drainage.  Neuro: alert and oriented X3, cranial nerves II-XII grossly intact  Lab Results: Basic Metabolic Panel:  Recent Labs Lab 06/28/14 1407 06/29/14 0645  NA 126* 125*  K 4.1 3.7  CL 90* 91*  CO2 21 18*  GLUCOSE 251* 206*  BUN 29* 33*  CREATININE 1.96* 2.35*  CALCIUM 10.1 9.1   Liver Function Tests:  Recent Labs Lab 06/28/14 0402 06/29/14 0645  AST 31 117*  ALT 24 80*  ALKPHOS 272* 255*  BILITOT 4.5*  4.5* 2.6*  PROT 7.1 6.7  ALBUMIN 2.2* 1.9*   CBC:  Recent Labs Lab 06/28/14 0402 06/29/14 0645  WBC 30.2* 35.2*  NEUTROABS 26.9* 31.7*  HGB 8.9* 8.1*  HCT 25.0* 23.2*  MCV 85.0 87.5  PLT 191 207   Cardiac Enzymes:  Recent Labs Lab 06/27/14 1725  TROPONINI <0.30   CBG:  Recent Labs Lab 06/28/14 1200 06/28/14 1659 06/28/14 2032 06/29/14 0036 06/29/14 0413 06/29/14 0746  GLUCAP 198* 279* 350* 352*  260* 222*   Hemoglobin A1C:  Recent Labs Lab 06/27/14 1725  HGBA1C 9.7*   Urinalysis:  Recent Labs Lab 06/27/14 2234  COLORURINE AMBER*  LABSPEC 1.031*  PHURINE 5.5  GLUCOSEU >1000*  HGBUR SMALL*  BILIRUBINUR SMALL*  KETONESUR 15*  PROTEINUR 100*  UROBILINOGEN 1.0  NITRITE POSITIVE*  LEUKOCYTESUR NEGATIVE   Misc. Labs: Osmolality 275 Lactic acid 1.3 Urine Osmolality 344 Urine Sodium 40 Urine Creatinine 98.7  Micro Results: Recent Results (from the past 240 hour(s))  CULTURE, BLOOD (ROUTINE X 2)     Status: None   Collection Time    06/27/14  4:55 PM      Result Value Ref Range Status   Specimen Description BLOOD RIGHT ARM   Final   Special Requests BOTTLES DRAWN AEROBIC AND ANAEROBIC 5CC   Final   Culture  Setup Time     Final   Value: 06/27/2014 22:09     Performed at [facility]   Culture     Final   Value:        BLOOD CULTURE RECEIVED NO GROWTH TO DATE CULTURE WILL BE HELD FOR 5 DAYS BEFORE ISSUING A FINAL NEGATIVE REPORT     Performed at [facility]   Report Status PENDING   Incomplete  CULTURE, BLOOD (ROUTINE X 2)     Status: None   Collection Time    06/27/14  9:06 PM      Result Value Ref Range Status   Specimen Description BLOOD LEFT WRIST   Final   Special Requests BOTTLES DRAWN AEROBIC ONLY 5CC   Final   Culture  Setup Time     Final   Value: 06/28/2014 00:26     Performed at [facility]   Culture     Final   Value:        BLOOD CULTURE RECEIVED NO GROWTH TO DATE CULTURE WILL BE HELD FOR 5 DAYS BEFORE ISSUING A FINAL NEGATIVE REPORT     Performed at [facility]   Report Status PENDING   Incomplete  URINE CULTURE     Status: None   Collection Time    06/27/14 10:34 PM      Result Value Ref Range Status   Specimen Description URINE, CLEAN CATCH   Final   Special Requests NONE   Final   Culture  Setup Time     Final   Value: 06/27/2014 23:11     Performed at [place]     Final     Value: 2,000 COLONIES/ML     Performed at [facility]   Culture     Final   Value: INSIGNIFICANT GROWTH     Performed at [facility]   Report Status 06/28/2014 FINAL   Final   Studies/Results: Ct Pelvis W Contrast  06/27/2014   CLINICAL DATA:  LEFT groin abscess.  Medial posterior thigh abscess.  EXAM: CT PELVIS WITH CONTRAST  TECHNIQUE: Multidetector CT imaging of the pelvis was performed using the standard protocol following the bolus administration of intravenous contrast.  CONTRAST:  75mL OMNIPAQUE IOHEXOL 300 MG/ML  SOLN  COMPARISON:  None.  FINDINGS: Cellulitis of the medial proximal LEFT thigh with reactive LEFT inguinal adenopathy. Borderline LEFT external iliac lymph nodes are present, also likely reactive. Visceral pelvis appears within normal limits. No aggressive osseous lesions. No gas within the soft tissues.  IMPRESSION: Cellulitis of the medial proximal LEFT thigh extending to the perineum. No abscess.   Electronically Signed   By: Dereck Ligas M.D.   On: 06/27/2014 20:02   Medications: I have reviewed the patient's current medications. Scheduled Meds: . clindamycin  300 mg Oral 4 times per day  . DULoxetine  60 mg Oral Daily  . enoxaparin (LOVENOX) injection  30 mg Subcutaneous Q24H  . gabapentin  300 mg Oral QID  . insulin aspart  0-20 Units Subcutaneous TID WC  . insulin glargine  40 Units Subcutaneous QHS  . levothyroxine  100 mcg Oral QAC breakfast  . pantoprazole  40 mg Oral Daily  . senna-docusate  1 tablet Oral QHS  . sodium chloride  3 mL Intravenous Q12H  . zolpidem  5 mg Oral QHS   Continuous Infusions: . sodium chloride 125 mL/hr (06/29/14 1044)   PRN Meds:.acetaminophen, HYDROcodone-acetaminophen, naphazoline-pheniramine, ondansetron Assessment/Plan: Principal Problem:   Sepsis Active Problems:   HYPOTHYROIDISM   DM type 2, uncontrolled, with severe neuropathy   Fibromyalgia syndrome   Essential hypertension, benign   Dysuria    Unspecified constipation   Cellulitis of groin   Lymphadenopathy   Hyperglycemia   Unspecified protein-calorie malnutrition   Liver function test abnormality   Acute renal failure superimposed  on stage 3 chronic kidney disease   Hyponatremia  #Sepsis secondary to cellulitis of the medial proximal left thigh extending to the perineum  -transition to clindamycin 300mg  QID -prn Norco 5-325 mg q6 prn, NS @ 125cc/hr   #DM type 2, uncontrolled, with severe neuropathy with hyperglycemia: Hgb A1c 9.7 -SSI-R QID, Lantus 40 u qhs   -Will need outpatient follow up with [facility]   #Acute on chronic CKD: FENa 0.5% - prerenal. Increased to 2.35 -continue hydration with IVF NS 125 cc/hr  -continue to hold home olmesartan   #Hyperbilirubinemia, elevated LFTs: t bili down to 2.6 but AST and ALT elevated -US Abdomen pending  #Essential hypertension, benign  -Currently normotensive  -Hold ARB in AKI  -monitor BP   #UTI: Insignificant growth on UCx, no further need for abx  #Chest pain: likely MSK, resolving -Protonix 40 meq qd  #Unspecified constipation  -Senna-S qhs  -Miralax daily  #Hypothyroidism -Continue Synthyroid 100 mcg qd   #Fibromyalgia  -Continue Cymbalta 60 mg daily, Neurontin 300 mg qid  #Headache  -prn Tylenol 325 daily   #Nausea  -prn Zofran q6 hours   #Insomina  -continued home Ambien 5 mg qhs   FEN: Continues to have hyponatremia -NS 125 cc/hr  -H/H carb modified diet   DVT ppx  -Lovenox, scds  Dispo: Disposition is deferred at this time, awaiting improvement of current medical problems.  Anticipated discharge in approximately 2 day(s).   The patient does have a current PCP Luan Moore, MD) and does need an [facility] hospital follow-up appointment after discharge.  The patient does not have transportation limitations that hinder transportation to clinic appointments.  .Services Needed at time of discharge: Y = Yes, Blank = No PT:   OT:   RN:    Equipment:   Other:     LOS: 2 days   Jacques Earthly, MD 06/29/2014, 11:20 AM

## 2014-06-29 NOTE — Care Management Note (Addendum)
    Page 1 of 2   07/06/2014     11:16:28 AM CARE MANAGEMENT NOTE 07/06/2014  Patient:  Kayla [name], Kayla [name]   Account Number:  [id number]  Date Initiated:  06/29/2014  Documentation initiated by:  Tomi Bamberger  Subjective/Objective Assessment:   admit - from home     Action/Plan:   hydrotherapy for wound   Anticipated DC Date:  07/06/2014   Anticipated DC Plan:  [place]  CM consult      [facility] Choice  HOME HEALTH   Choice offered to / List presented to:  C-1 Patient        [place] arranged  HH-1 RN      [place].   Status of service:  Completed, signed off Medicare Important Message given?  NO (If response is "NO", the following Medicare IM given date fields will be blank) Date Medicare IM given:   Medicare IM given by:   Date Additional Medicare IM given:   Additional Medicare IM given by:    Discharge Disposition:  HOME/SELF CARE  Per UR Regulation:  Reviewed for med. necessity/level of care/duration of stay  If discussed at [place] of Stay Meetings, dates discussed:   07/05/2014    Comments:  07/06/14 1114 Tomi Bamberger RN, BSN [phone] patient is for dc today, [place] notified.  Per surgery patient will need Wet to Dry BID wound care dressing changes. Patient will f/u with internal med clinic at [place].  07/05/14 [place], BSN [phone] NCM spoke with surgery and they beleive that patient will not need ltac and that she can go home with [facility] for wound care.  Patient states [facility] is who she would like to work with,  referral made to [facility] for [place] notified.  Soc will begin 24-48 hrs post dc.  05/3014 1706 Tomi Bamberger RN, BSN [phone] patient states she has the [employer], NCM informed patient that we can not assist her if she has medication coverage.  Patient think she will go home tomorrow, NCM will speak with MD about getting generic meds for patient.

## 2014-06-30 ENCOUNTER — Inpatient Hospital Stay (HOSPITAL_COMMUNITY): Payer: Self-pay

## 2014-06-30 ENCOUNTER — Encounter (HOSPITAL_COMMUNITY): Payer: Self-pay | Admitting: Radiology

## 2014-06-30 DIAGNOSIS — D72829 Elevated white blood cell count, unspecified: Secondary | ICD-10-CM | POA: Diagnosis present

## 2014-06-30 DIAGNOSIS — R110 Nausea: Secondary | ICD-10-CM

## 2014-06-30 DIAGNOSIS — M797 Fibromyalgia: Secondary | ICD-10-CM

## 2014-06-30 DIAGNOSIS — G4700 Insomnia, unspecified: Secondary | ICD-10-CM

## 2014-06-30 DIAGNOSIS — N179 Acute kidney failure, unspecified: Secondary | ICD-10-CM

## 2014-06-30 DIAGNOSIS — A419 Sepsis, unspecified organism: Principal | ICD-10-CM

## 2014-06-30 DIAGNOSIS — L03116 Cellulitis of left lower limb: Secondary | ICD-10-CM

## 2014-06-30 DIAGNOSIS — E1165 Type 2 diabetes mellitus with hyperglycemia: Secondary | ICD-10-CM

## 2014-06-30 DIAGNOSIS — N183 Chronic kidney disease, stage 3 (moderate): Secondary | ICD-10-CM

## 2014-06-30 DIAGNOSIS — I129 Hypertensive chronic kidney disease with stage 1 through stage 4 chronic kidney disease, or unspecified chronic kidney disease: Secondary | ICD-10-CM

## 2014-06-30 DIAGNOSIS — E1140 Type 2 diabetes mellitus with diabetic neuropathy, unspecified: Secondary | ICD-10-CM

## 2014-06-30 DIAGNOSIS — K5900 Constipation, unspecified: Secondary | ICD-10-CM

## 2014-06-30 DIAGNOSIS — E039 Hypothyroidism, unspecified: Secondary | ICD-10-CM

## 2014-06-30 DIAGNOSIS — N390 Urinary tract infection, site not specified: Secondary | ICD-10-CM

## 2014-06-30 DIAGNOSIS — R51 Headache: Secondary | ICD-10-CM

## 2014-06-30 LAB — GLUCOSE, CAPILLARY
Glucose-Capillary: 130 mg/dL — ABNORMAL HIGH (ref 70–99)
Glucose-Capillary: 137 mg/dL — ABNORMAL HIGH (ref 70–99)
Glucose-Capillary: 113 mg/dL — ABNORMAL HIGH (ref 70–99)
Glucose-Capillary: 80 mg/dL (ref 70–99)
Glucose-Capillary: 142 mg/dL — ABNORMAL HIGH (ref 70–99)

## 2014-06-30 LAB — DIFFERENTIAL
Basophils Absolute: 0.4 10*3/uL — ABNORMAL HIGH (ref 0.0–0.1)
Basophils Relative: 1 % (ref 0–1)
Eosinophils Absolute: 0.4 10*3/uL (ref 0.0–0.7)
Eosinophils Relative: 1 % (ref 0–5)
Lymphocytes Relative: 5 % — ABNORMAL LOW (ref 12–46)
Lymphs Abs: 2 10*3/uL (ref 0.7–4.0)
Monocytes Absolute: 2 10*3/uL — ABNORMAL HIGH (ref 0.1–1.0)
Monocytes Relative: 5 % (ref 3–12)
Neutro Abs: 35.2 10*3/uL — ABNORMAL HIGH (ref 1.7–7.7)
Neutrophils Relative %: 88 % — ABNORMAL HIGH (ref 43–77)

## 2014-06-30 LAB — COMPREHENSIVE METABOLIC PANEL
ALT: 63 U/L — ABNORMAL HIGH (ref 0–35)
AST: 71 U/L — ABNORMAL HIGH (ref 0–37)
Albumin: 1.8 g/dL — ABNORMAL LOW (ref 3.5–5.2)
Alkaline Phosphatase: 291 U/L — ABNORMAL HIGH (ref 39–117)
Anion gap: 12 (ref 5–15)
BUN: 28 mg/dL — ABNORMAL HIGH (ref 6–23)
CO2: 20 mEq/L (ref 19–32)
Calcium: 9.2 mg/dL (ref 8.4–10.5)
Chloride: 99 mEq/L (ref 96–112)
Creatinine, Ser: 1.87 mg/dL — ABNORMAL HIGH (ref 0.50–1.10)
GFR calc Af Amer: 35 mL/min — ABNORMAL LOW (ref >90)
GFR calc non Af Amer: 30 mL/min — ABNORMAL LOW (ref >90)
Glucose, Bld: 116 mg/dL — ABNORMAL HIGH (ref 70–99)
Potassium: 3.7 mEq/L (ref 3.7–5.3)
Sodium: 131 mEq/L — ABNORMAL LOW (ref 137–147)
Total Bilirubin: 2.3 mg/dL — ABNORMAL HIGH (ref 0.3–1.2)
Total Protein: 7.1 g/dL (ref 6.0–8.3)

## 2014-06-30 LAB — IRON AND TIBC
Iron: 70 ug/dL (ref 42–135)
Saturation Ratios: 37 % (ref 20–55)
TIBC: 189 ug/dL — ABNORMAL LOW (ref 250–470)
UIBC: 119 ug/dL — ABNORMAL LOW (ref 125–400)

## 2014-06-30 LAB — CBC
HCT: 23.8 % — ABNORMAL LOW (ref 36.0–46.0)
Hemoglobin: 8.5 g/dL — ABNORMAL LOW (ref 12.0–15.0)
MCH: 30.8 pg (ref 26.0–34.0)
MCHC: 35.7 g/dL (ref 30.0–36.0)
MCV: 86.2 fL (ref 78.0–100.0)
Platelets: 207 10*3/uL (ref 150–400)
RBC: 2.76 MIL/uL — ABNORMAL LOW (ref 3.87–5.11)
RDW: 14.3 % (ref 11.5–15.5)
WBC: 40 10*3/uL — ABNORMAL HIGH (ref 4.0–10.5)

## 2014-06-30 MED ORDER — IOHEXOL 300 MG/ML  SOLN
80.0000 mL | Freq: Once | INTRAMUSCULAR | Status: AC | PRN
  Administered 2014-06-30: 80 mL via INTRAVENOUS

## 2014-06-30 MED ORDER — SODIUM CHLORIDE 0.9 % IV SOLN
INTRAVENOUS | Status: DC
  Administered 2014-06-30 – 2014-07-03 (×6): via INTRAVENOUS

## 2014-06-30 MED ORDER — SODIUM CHLORIDE 0.9 % IV BOLUS (SEPSIS)
1000.0000 mL | Freq: Once | INTRAVENOUS | Status: AC
  Administered 2014-06-30: 1000 mL via INTRAVENOUS

## 2014-06-30 MED ORDER — INSULIN GLARGINE 100 UNIT/ML ~~LOC~~ SOLN
30.0000 [IU] | Freq: Once | SUBCUTANEOUS | Status: AC
  Administered 2014-06-30: 30 [IU] via SUBCUTANEOUS
  Filled 2014-06-30: qty 0.3

## 2014-06-30 NOTE — Progress Notes (Signed)
Patient ID: Kayla [name], female   DOB: Jun 17, 1961, 53 y.o.   MRN: [id number] Medicine attending progress note: I personally interviewed and examined this patient today together with resident physician Dr. Dellia Nims. We reviewed her recent CT scan with the radiologist. 53 year old  obese, diabetic, woman admitted on September 28 for evaluation of left upper thigh swelling and tenderness and low-grade fevers. Physical exam remarkable for an area of extensive cellulitis involving the proximal medial left thigh. She was started on broad-spectrum parenteral antibiotics. Highest temperature since admission only 99.2. Although she appears clinically stable on antibiotics, I am concerned with a progressive rise in her white blood count from 30,000 on admission to current value of 40,000 with 88% neutrophils, 5% lymphocytes, 5% monocytes. Toxic granulations reported on review of the peripheral blood film consistent with known infection. Although initial CT scan did not suggest an abscess , I feel the patient needs to be reevaluated in view of the persistent leukocytosis. Clinical history discussed with the radiologist and a followup CT scan is recommended as the best test to exclude abscess formation. We will proceed with the same. Close attention to her kidney function in view of chronic renal insufficiency. In addition to the leukocytosis, she has unexplained liver function abnormalities with no evidence for biliary obstruction on ultrasound done yesterday. No focal liver lesions.

## 2014-06-30 NOTE — Discharge Summary (Signed)
Name: Kayla [name] MRN: [id number] DOB: [birth year] 53 y.o. PCP: Luan Moore, MD  Date of Admission: 06/27/2014  4:36 PM Date of Discharge: 07/06/2014 Attending Physician: Annia Belt, MD  Discharge Diagnosis:  Principal Problem:   Cellulitis and abscess Active Problems:   HYPOTHYROIDISM   DM type 2, uncontrolled, with severe neuropathy   Fibromyalgia syndrome   Essential hypertension, benign   Unspecified protein-calorie malnutrition   Liver function test abnormality   Acute renal failure superimposed on stage 3 chronic kidney disease   Antimitochondrial antibody positive  Discharge Medications:   Medication List         ACCU-CHEK FASTCLIX LANCETS Misc  1 each by Does not apply route 4 (four) times daily -  before meals and at bedtime.     ACCU-CHEK NANO SMARTVIEW W/DEVICE Kit  1 each by Does not apply route 4 (four) times daily -  before meals and at bedtime.     ACCU-CHEK AVIVA PLUS W/DEVICE Kit  1 kit by Does not apply route as directed.     cephALEXin 500 MG capsule  Commonly known as:  KEFLEX  Take 1 capsule (500 mg total) by mouth 2 (two) times daily.     collagenase ointment  Commonly known as:  SANTYL  Apply topically 3 (three) times daily.     DULoxetine 30 MG capsule  Commonly known as:  CYMBALTA  Take 2 capsules (60 mg total) by mouth daily.     gabapentin 300 MG capsule  Commonly known as:  NEURONTIN  Take 2 capsules (600 mg total) by mouth 4 (four) times daily.     guaiFENesin-dextromethorphan 100-10 MG/5ML syrup  Commonly known as:  ROBITUSSIN DM  Take 5 mLs by mouth every 4 (four) hours as needed for cough.     HYDROcodone-acetaminophen 10-325 MG per tablet  Commonly known as:  NORCO  Take 1 tablet by mouth every 6 (six) hours as needed.     insulin aspart 100 UNIT/ML injection  Commonly known as:  NOVOLOG FLEXPEN  Inject 3-5 Units into the skin 3 (three) times daily before meals.     insulin glargine 100 UNIT/ML injection    Commonly known as:  LANTUS  Inject 0.4 mLs (40 Units total) into the skin daily.     levothyroxine 150 MCG tablet  Commonly known as:  SYNTHROID, LEVOTHROID  Take 1 tablet (150 mcg total) by mouth daily before breakfast.     olmesartan 40 MG tablet  Commonly known as:  BENICAR  Take 40 mg by mouth daily as needed. Patient only takes if she feels her blood pressure is high, will have daughter check if over 130 patient will take medication.     polyethylene glycol packet  Commonly known as:  MIRALAX / GLYCOLAX  Take 17 g by mouth daily.     senna-docusate 8.6-50 MG per tablet  Commonly known as:  Senokot-S  Take 1 tablet by mouth at bedtime.     zolpidem 5 MG tablet  Commonly known as:  AMBIEN  Take 5 mg by mouth at bedtime as needed for sleep.        Disposition and follow-up:   Kayla [name] was discharged from [facility] in Stable condition.  At the hospital follow up visit please address:  1.  received abx in the hospital total 9 days. re-evaluate abscess/wound and need for prolonged abx. Discharging with keflex for 7 more days.   Please check TSH in 4-6 weeks. It was >  100 here. She was noncompliant with her thyroid meds. We increased synthroid to 150 mcg daily.  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 we adjusted insulin in the hospital. Perhaps Barry Brunner can help her? 2.  Labs / imaging needed at time of follow-up: CBC & CMP to make sure everything is improving.  3.  Pending labs/ test needing follow-up: g6pd and hemoglobin electropheresis, BCX, wound cx.  Follow-up Appointments: Follow-up Information   Follow up with Jessee Avers, MD On 07/14/2014. (you have appointment at 2:15Pm. )    Specialty:  Internal Medicine   Contact information:   [place] [address] [phone]       Follow up with [facility] On 07/26/2014.  (Appointment at 2:45pm, please check in by 2:15 with the front desk to fill out paperwork)    Contact information:   [place] [address] [phone]       Follow up with [facility] for wound care)    Contact information:   [address] [place] [address] [phone]       Discharge Instructions: Discharge Instructions   Increase activity slowly    Complete by:  As directed            Consultations: Treatment Team:  Md Ccs, MD  Procedures Performed:  Ct Pelvis W Contrast  06/27/2014   CLINICAL DATA:  LEFT groin abscess.  Medial posterior thigh abscess.  EXAM: CT PELVIS WITH CONTRAST  TECHNIQUE: Multidetector CT imaging of the pelvis was performed using the standard protocol following the bolus administration of intravenous contrast.  CONTRAST:  12m OMNIPAQUE IOHEXOL 300 MG/ML  SOLN  COMPARISON:  None.  FINDINGS: Cellulitis of the medial proximal LEFT thigh with reactive LEFT inguinal adenopathy. Borderline LEFT external iliac lymph nodes are present, also likely reactive. Visceral pelvis appears within normal limits. No aggressive osseous lesions. No gas within the soft tissues.  IMPRESSION: Cellulitis of the medial proximal LEFT thigh extending to the perineum. No abscess.   Electronically Signed   By: GDereck LigasM.D.   On: 06/27/2014 20:02   [place]domen Complete  06/29/2014   CLINICAL DATA:  Elevated bilirubin.  Abdominal pain.  EXAM: ULTRASOUND ABDOMEN COMPLETE  COMPARISON:  Ultrasound, 08/18/2012.  FINDINGS: Gallbladder:  No gallstones or wall thickening visualized. No sonographic Murphy sign noted.  Common bile duct:  Diameter: 5.9 mm  Liver:  No focal lesion identified. Within normal limits in parenchymal echogenicity.  IVC:  No abnormality visualized.  Pancreas:  Hypoechoic rounded area lies along the tail the pancreas. This is most likely directly adjacent bowel. A pancreatic mass is not excluded,  however. Remainder the pancreas is unremarkable.  Spleen:  Size and appearance within normal limits.  Right Kidney:  Length: 11.8 cm. 14 mm lower pole cyst. Normal parenchymal echogenicity. No stone. No hydronephrosis.  Left Kidney:  Length: 11.2 cm. Echogenicity within normal limits. No mass or hydronephrosis visualized.  Abdominal aorta:  No aneurysm visualized.  Other findings:  None.  IMPRESSION: 1. No acute findings.  Normal gallbladder.  No bile duct dilation. 2. Round, hypoechoic lesion versus bowel lies along the tail the pancreas. Since a pancreatic mass is not excluded, followup imaging with contrast enhanced CT of the abdomen is recommended. 3. 14 mm right renal cyst.  No other abnormalities.  Electronically Signed   By: Lajean Manes M.D.   On: 06/29/2014 12:41    2D Echo:   Admission HPI:  Kayla [name] is a 53 year old woman with history of DM2 c/b neuropathy, fibromyalgia presenting with L upper thigh swelling and tenderness. She first noticed a small bump at the beginning of last week. She tried epson salt soak, hydrogen peroxide and warm compress. It increased in tenderness and expanded in size. Denies drainage or trauma. Denies recent sexual contact or vaginal discharge. She reports she has dysuria that started yesterday. She previously had an abscess of both labia 4-5 years ago and left buttock a couple weeks after that she underwent I&D.  She had fever to 100.5 Saturday, chills, headache, shortness of breath, chest pain. She reports the mid chest pain has been ongoing for a month and is reproducible on palpation. She also has heartburn, but no reflux. She denies cough, change in chronic nausea, vomiting, abdominal pain, diarrhea, rectal pain, edema, rash.   Hospital Course by problem list: Principal Problem:   Cellulitis and abscess Active Problems:   HYPOTHYROIDISM   DM type 2, uncontrolled, with severe neuropathy   Fibromyalgia syndrome   Essential hypertension, benign    Unspecified protein-calorie malnutrition   Liver function test abnormality   Acute renal failure superimposed on stage 3 chronic kidney disease   Antimitochondrial antibody positive   53 yo female with DM II, CKD, HTN here with left thigh abscess.   #Sepsis secondary to abscess of the medial proximal left thigh  no abscess seen on CT pelvis initially it was just thought to be cellulitis. She was afebrile. However, her WBC was elevated and going up. went upto ~43.9K. surgery I/Ded abscess. Remains afebrile. WBC trending down slowly. We looked at the smear with Dr. Beryle Beams to make sure she doesn't have CML or other causes of WBC elevation. look below.   -previously on vanc/zosyn>>clinda>>now cefazolin. Will send home with keflex for 10 days.  - [place]. Wound cx NGTD.  -prn Norco 5-325 mg 2 tablets q6 prn pain  -Continue ISS and ambulate to avoid atelectasis. Had trace b/l pleural effusion on 07/03/14 cxr. Will repeat one to make sure it cleared.  Please follow this outpatient.  - weaned off oxygen after transfusion, initially required that after her I/D, and continued to require it likely since her Hgb was very low.  - needs wound care at home, [facility] ordered. Will also arrange surgery clinic follow up.   #Acute on chronic normocytic anemia - baseline around 11, now 6.4 today.  - etiology unclear. Differential includes include CKD, hemolysis, myeloma, thyroid disease? TSH high on 2014 and previously. No iron deficiency on anemia panel.  - retic count elevated, high haptoglobin, high LDH, negative coombs.  - 07/05/14 smear review with Dr. Beryle Beams: spherocytes, target cells.  - Checked g6pd and hemoglobin electropheresis. Pending. Needs follow up outpatient. - could also be 2/2 to hypothyroidism. TSH elevated. Usually it causes macrocytic anemia, her MCV normocytic. Doesn't have bone marrow suppression that's seen with hypothyroidism.  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 Checked TSH which was elevated in the past  And was found to be >100. Patient states she wasn't compliant with the med.  - she is clinically not appearing to have hypothymism symptoms other than fatigue which could be from other causes such as anemia. - increased synthyroid to 1532m daily for discharge. - needs TSH recheck in 4-6 weeks.  #  Leukocytosis - normal WBC on may 2015. Admitted with WBC in 30.1. Was upto 43.9 but improving after abscess drainage.  - has mature neutrophils with toxic granulations likely 2/2 to infection. But she had myelocytes and promyelocytes which decreased on smear 07/05/14. Doesn't appear to be CML.   #Transaminitis - chronically elevated alk phos since ~2013. AST/alt was elevated but Improving. Tbili was up in ~4's but trended down. Hep panel negative. Iron normal. U/s abdomen normal.  - Alk phos chronically high + AMA positive + no obstruction seen on ab u/s. 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  - Will need to be followed GI outpatient for primary biliary cirrhosis.   #Acute on chronic CKD III: FENa 0.5% - prerenal. Increased to 2.35 but now normalized to better than baseline. Baseline around 1.6.  - continue to monitor.   #DM type 2, uncontrolled, with severe neuropathy with hyperglycemia: Hgb A1c 9.7.came in with bg 447. BG under 140's now.  - lantus 40 at home. Lowered to 30 Units here with meal time coverage. Will send her home with same regimen as she came in with. -Will need outpatient follow up with [facility]   #Essential hypertension, benign  -held olmesartan as BP was low but now came up so will restart on discharge.   #UTI: Insignificant growth on UCx, no further need for abx.   #Chest pain: likely MSK, resolved  -Protonix 40 meq qd   #Unspecified constipation, likely worsened with opiates here. -Senna-S qhs  -Miralax daily   #Fibromyalgia    -Continued Cymbalta 60 mg daily, Neurontin 300 mg qid  #Headache  -prn Tylenol 325 daily  #Nausea  -gave prn Zofran q6 hours   Discharge Vitals:   BP 148/75  Pulse 80  Temp(Src) 99 F (37.2 C) (Oral)  Resp 20  Ht 5' 1.81" (1.57 m)  Wt 87.771 kg (193 lb 8 oz)  BMI 35.61 kg/m2  SpO2 100%  LMP 01/12/2014  Discharge Labs:  Results for orders placed during the hospital encounter of 06/27/14 (from the past 24 hour(s))  GLUCOSE, CAPILLARY     Status: Abnormal   Collection Time    07/05/14  4:52 PM      Result Value Ref Range   Glucose-Capillary 123 (*) 70 - 99 mg/dL  GLUCOSE, CAPILLARY     Status: None   Collection Time    07/05/14  9:11 PM      Result Value Ref Range   Glucose-Capillary 97  70 - 99 mg/dL   Comment 1 Documented in Chart     Comment 2 Notify RN    CBC     Status: Abnormal   Collection Time    07/06/14  5:43 AM      Result Value Ref Range   WBC 26.0 (*) 4.0 - 10.5 K/uL   RBC 3.18 (*) 3.87 - 5.11 MIL/uL   Hemoglobin 9.7 (*) 12.0 - 15.0 g/dL   HCT 27.7 (*) 36.0 - 46.0 %   MCV 87.1  78.0 - 100.0 fL   MCH 30.5  26.0 - 34.0 pg   MCHC 35.0  30.0 - 36.0 g/dL   RDW 16.5 (*) 11.5 - 15.5 %   Platelets 203  150 - 400 K/uL  BASIC METABOLIC PANEL     Status: Abnormal   Collection Time    07/06/14  5:43 AM      Result Value Ref Range   Sodium 135 (*) 137 - 147  mEq/L   Potassium 3.6 (*) 3.7 - 5.3 mEq/L   Chloride 99  96 - 112 mEq/L   CO2 26  19 - 32 mEq/L   Glucose, Bld 186 (*) 70 - 99 mg/dL   BUN 7  6 - 23 mg/dL   Creatinine, Ser 1.07  0.50 - 1.10 mg/dL   Calcium 9.2  8.4 - 10.5 mg/dL   GFR calc non Af Amer 59 (*) >90 mL/min   GFR calc Af Amer 68 (*) >90 mL/min   Anion gap 10  5 - 15  GLUCOSE, CAPILLARY     Status: Abnormal   Collection Time    07/06/14  7:39 AM      Result Value Ref Range   Glucose-Capillary 152 (*) 70 - 99 mg/dL  TSH     Status: Abnormal   Collection Time    07/06/14 11:02 AM      Result Value Ref Range   TSH >100.000 (*) 0.350 -  4.500 uIU/mL  GLUCOSE, CAPILLARY     Status: Abnormal   Collection Time    07/06/14 12:18 PM      Result Value Ref Range   Glucose-Capillary 121 (*) 70 - 99 mg/dL    Signed: Dellia Nims, MD 07/06/2014, 2:48 PM    Services Ordered on Discharge: home health nursing for wound care. Equipment Ordered on Discharge:

## 2014-06-30 NOTE — Progress Notes (Addendum)
Subjective:  Had some forgetfulness pert RN this morning. Afebrile overnight. Doing well, has some pain on the left thigh where she has the cellulitis. Had a smaller cyst on posterior thigh close to her labium which drained clear fluid. Also has some weakness of right hand.   Objective: Vital signs in last 24 hours: Filed Vitals:   06/29/14 0504 06/29/14 1153 06/29/14 2137 06/30/14 0542  BP: 104/58 117/56 114/57 101/67  Pulse: 94 102 95 94  Temp: 98.2 F (36.8 C) 97.9 F (36.6 C) 99 F (37.2 C) 97.4 F (36.3 C)  TempSrc: Oral Oral Oral Oral  Resp: 18 20 20 20   Height:      Weight: 82.8 kg (182 lb 8.7 oz)   86.6 kg (190 lb 14.7 oz)  SpO2: 98% 97% 96% 94%   Weight change: 3.8 kg (8 lb 6 oz)  Intake/Output Summary (Last 24 hours) at 06/30/14 1155 Last data filed at 06/30/14 1011  Gross per 24 hour  Intake 2674.16 ml  Output      0 ml  Net 2674.16 ml   General: NAD HEENT: [place]/at, anicteric sclera  Cardiac: RRR, no rubs, murmurs or gallops  Pulm: clear to auscultation bilaterally, no wheezes, rales, or rhonchi  Abd: soft, tender to palpation in LLQ, nondistended, BS present  Ext: warm and well perfused, no pedal edema, left groin/medial and posterior thigh with cellulitis moderate ttp with decreased induration. No drainage, warmth, or erythema. There is a smaller cyst on the posterior left thigh near her labium which drained clear fluid.  Neuro: alert and oriented X3, cranial nerves II-XII grossly intact. 4/5 strength to extension on right arm.   Lab Results: Basic Metabolic Panel:  Recent Labs Lab 06/29/14 1350 06/30/14 0730  NA 127* 131*  K 3.7 3.7  CL 91* 99  CO2 21 20  GLUCOSE 85 116*  BUN 33* 28*  CREATININE 2.31* 1.87*  CALCIUM 10.0 9.2   Liver Function Tests:  Recent Labs Lab 06/29/14 0645 06/30/14 0730  AST 117* 71*  ALT 80* 63*  ALKPHOS 255* 291*  BILITOT 2.6* 2.3*  PROT 6.7 7.1  ALBUMIN 1.9* 1.8*   CBC:  Recent Labs Lab 06/28/14 0402  06/29/14 0645 06/30/14 0730  WBC 30.2* 35.2* 40.0*  NEUTROABS 26.9* 31.7*  --   HGB 8.9* 8.1* 8.5*  HCT 25.0* 23.2* 23.8*  MCV 85.0 87.5 86.2  PLT 191 207 207   Cardiac Enzymes:  Recent Labs Lab 06/27/14 1725  TROPONINI <0.30   CBG:  Recent Labs Lab 06/29/14 0746 06/29/14 1149 06/29/14 1723 06/29/14 2128 06/29/14 2139 06/30/14 0744  GLUCAP 222* 111* 126* 137* 143* 130*   Hemoglobin A1C:  Recent Labs Lab 06/27/14 1725  HGBA1C 9.7*   Urinalysis:  Recent Labs Lab 06/27/14 2234  COLORURINE AMBER*  LABSPEC 1.031*  PHURINE 5.5  GLUCOSEU >1000*  HGBUR SMALL*  BILIRUBINUR SMALL*  KETONESUR 15*  PROTEINUR 100*  UROBILINOGEN 1.0  NITRITE POSITIVE*  LEUKOCYTESUR NEGATIVE    Micro Results: Recent Results (from the past 240 hour(s))  CULTURE, BLOOD (ROUTINE X 2)     Status: None   Collection Time    06/27/14  4:55 PM      Result Value Ref Range Status   Specimen Description BLOOD RIGHT ARM   Final   Special Requests BOTTLES DRAWN AEROBIC AND ANAEROBIC 5CC   Final   Culture  Setup Time     Final   Value: 06/27/2014 22:09     Performed at  [facility]     Final   Value:        BLOOD CULTURE RECEIVED NO GROWTH TO DATE CULTURE WILL BE HELD FOR 5 DAYS BEFORE ISSUING A FINAL NEGATIVE REPORT     Performed at [facility]   Report Status PENDING   Incomplete  CULTURE, BLOOD (ROUTINE X 2)     Status: None   Collection Time    06/27/14  9:06 PM      Result Value Ref Range Status   Specimen Description BLOOD LEFT WRIST   Final   Special Requests BOTTLES DRAWN AEROBIC ONLY 5CC   Final   Culture  Setup Time     Final   Value: 06/28/2014 00:26     Performed at [facility]   Culture     Final   Value:        BLOOD CULTURE RECEIVED NO GROWTH TO DATE CULTURE WILL BE HELD FOR 5 DAYS BEFORE ISSUING A FINAL NEGATIVE REPORT     Performed at [facility]   Report Status PENDING   Incomplete  URINE CULTURE     Status: None    Collection Time    06/27/14 10:34 PM      Result Value Ref Range Status   Specimen Description URINE, CLEAN CATCH   Final   Special Requests NONE   Final   Culture  Setup Time     Final   Value: 06/27/2014 23:11     Performed at [facility]     Final   Value: 2,000 COLONIES/ML     Performed at [facility]   Culture     Final   Value: INSIGNIFICANT GROWTH     Performed at [facility]   Report Status 06/28/2014 FINAL   Final   Studies/Results: US Abdomen Complete  06/29/2014   CLINICAL DATA:  Elevated bilirubin.  Abdominal pain.  EXAM: ULTRASOUND ABDOMEN COMPLETE  COMPARISON:  Ultrasound, 08/18/2012.  FINDINGS: Gallbladder:  No gallstones or wall thickening visualized. No sonographic Murphy sign noted.  Common bile duct:  Diameter: 5.9 mm  Liver:  No focal lesion identified. Within normal limits in parenchymal echogenicity.  IVC:  No abnormality visualized.  Pancreas:  Hypoechoic rounded area lies along the tail the pancreas. This is most likely directly adjacent bowel. A pancreatic mass is not excluded, however. Remainder the pancreas is unremarkable.  Spleen:  Size and appearance within normal limits.  Right Kidney:  Length: 11.8 cm. 14 mm lower pole cyst. Normal parenchymal echogenicity. No stone. No hydronephrosis.  Left Kidney:  Length: 11.2 cm. Echogenicity within normal limits. No mass or hydronephrosis visualized.  Abdominal aorta:  No aneurysm visualized.  Other findings:  None.  IMPRESSION: 1. No acute findings.  Normal gallbladder.  No bile duct dilation. 2. Round, hypoechoic lesion versus bowel lies along the tail the pancreas. Since a pancreatic mass is not excluded, followup imaging with contrast enhanced CT of the abdomen is recommended. 3. 14 mm right renal cyst.  No other abnormalities.   Electronically Signed   By: Lajean Manes M.D.   On: 06/29/2014 12:41   Medications: I have reviewed the patient's current medications. Scheduled  Meds: . clindamycin  300 mg Oral 4 times per day  . DULoxetine  60 mg Oral Daily  . enoxaparin (LOVENOX) injection  30 mg Subcutaneous Q24H  . gabapentin  300 mg Oral QID  . insulin aspart  0-20 Units Subcutaneous TID WC  . insulin glargine  40 Units Subcutaneous QHS  . levothyroxine  100 mcg Oral QAC breakfast  . pantoprazole  40 mg Oral Daily  . polyethylene glycol  17 g Oral Daily  . senna-docusate  1 tablet Oral QHS  . sodium chloride  3 mL Intravenous Q12H  . zolpidem  5 mg Oral QHS   Continuous Infusions:   PRN Meds:.acetaminophen, HYDROcodone-acetaminophen, naphazoline-pheniramine, ondansetron Assessment/Plan: Principal Problem:   Sepsis Active Problems:   HYPOTHYROIDISM   DM type 2, uncontrolled, with severe neuropathy   Fibromyalgia syndrome   Essential hypertension, benign   Dysuria   Unspecified constipation   Cellulitis of groin   Lymphadenopathy   Hyperglycemia   Unspecified protein-calorie malnutrition   Liver function test abnormality   Acute renal failure superimposed on stage 3 chronic kidney disease   Hyponatremia  #Sepsis secondary to cellulitis of the medial proximal left thigh extending to the perineum  no abscess seen on CT pelvis initially. But WBC keeps trending up to 40 today with 90% neutrophil. Remains afebrile though. -transitioned to clindamycin 300mg  QID for now. Will do keflex on discharge if remains stable. -prn Norco 5-325 mg 2 tablets q6 prn pain - BCX NGTD. - WBC trending up (40's) - 88% neutrophils. Likely from infection - could be an abscess which didn't show up on CT pelvis previously.  #Acute on chronic CKD: FENa 0.5% - prerenal. Increased to 2.35 but Crt trending down to 1.87.  Baseline around 1.6. -continue to hold home olmesartan as BP is low in 100's.   #DM type 2, uncontrolled, with severe neuropathy with hyperglycemia: Hgb A1c 9.7.came in with bg 447. Today doing better on SSI-R QID, Lantus 40 u qhs   -Will need outpatient  follow up with [facility]   #Hyperbilirubinemia, elevated LFTs: T bili and AST/ALT both trending down -US Abdomen Normal gallbladder. No bile duct dilation. Round, hypoechoic lesion along pancreas tail, likely bowel gas. -Hep panel pending. Hemochromatosis negative.   #Essential hypertension, benign  -Currently normotensive. -continue to hold home olmesartan as BP is low in 100's.  -monitor BP   #UTI: Insignificant growth on UCx, no further need for abx.  #Chest pain: likely MSK, resolved -Protonix 40 meq qd  #Unspecified constipation  -Senna-S qhs  -Miralax daily  #Hypothyroidism -Continue Synthyroid 100 mcg qd   #Fibromyalgia  -Continue Cymbalta 60 mg daily, Neurontin 300 mg qid  #Headache  -prn Tylenol 325 daily   #Nausea  -prn Zofran q6 hours   #Insomina  -continued home Ambien 5 mg qhs. Only uses as needed.   FEN: Continues to have hyponatremia but improving. -H/H carb modified diet   DVT ppx  -Lovenox, scds  Dispo: Disposition is deferred at this time, awaiting improvement of current medical problems.  Anticipated discharge in approximately 2 day(s).   The patient does have a current PCP Luan Moore, MD) and does need an [facility] hospital follow-up appointment after discharge.  The patient does not have transportation limitations that hinder transportation to clinic appointments.  .Services Needed at time of discharge: Y = Yes, Blank = No PT:   OT:   RN:   Equipment:   Other:     LOS: 3 days   Kayla Nims, MD 06/30/2014, 11:55 AM

## 2014-07-01 ENCOUNTER — Inpatient Hospital Stay (HOSPITAL_COMMUNITY): Payer: Self-pay | Admitting: Anesthesiology

## 2014-07-01 ENCOUNTER — Encounter (HOSPITAL_COMMUNITY): Payer: MEDICAID | Admitting: Anesthesiology

## 2014-07-01 ENCOUNTER — Encounter (HOSPITAL_COMMUNITY)
Admission: AD | Disposition: A | Discharge status: Home Health | Payer: Self-pay | Source: Ambulatory Visit | Attending: Oncology

## 2014-07-01 ENCOUNTER — Encounter (HOSPITAL_COMMUNITY): Payer: Self-pay | Admitting: Certified Registered"

## 2014-07-01 DIAGNOSIS — R6520 Severe sepsis without septic shock: Secondary | ICD-10-CM

## 2014-07-01 HISTORY — PX: INCISION AND DRAINAGE ABSCESS: SHX5864

## 2014-07-01 LAB — COMPREHENSIVE METABOLIC PANEL
ALT: 51 U/L — ABNORMAL HIGH (ref 0–35)
AST: 58 U/L — ABNORMAL HIGH (ref 0–37)
Albumin: 1.9 g/dL — ABNORMAL LOW (ref 3.5–5.2)
Alkaline Phosphatase: 337 U/L — ABNORMAL HIGH (ref 39–117)
Anion gap: 12 (ref 5–15)
BUN: 16 mg/dL (ref 6–23)
CO2: 19 mEq/L (ref 19–32)
Calcium: 9.2 mg/dL (ref 8.4–10.5)
Chloride: 104 mEq/L (ref 96–112)
Creatinine, Ser: 1.34 mg/dL — ABNORMAL HIGH (ref 0.50–1.10)
GFR calc Af Amer: 52 mL/min — ABNORMAL LOW (ref >90)
GFR calc non Af Amer: 45 mL/min — ABNORMAL LOW (ref >90)
Glucose, Bld: 92 mg/dL (ref 70–99)
Potassium: 4.8 mEq/L (ref 3.7–5.3)
Sodium: 135 mEq/L — ABNORMAL LOW (ref 137–147)
Total Bilirubin: 2.6 mg/dL — ABNORMAL HIGH (ref 0.3–1.2)
Total Protein: 7.1 g/dL (ref 6.0–8.3)

## 2014-07-01 LAB — GLUCOSE, CAPILLARY
Glucose-Capillary: 101 mg/dL — ABNORMAL HIGH (ref 70–99)
Glucose-Capillary: 126 mg/dL — ABNORMAL HIGH (ref 70–99)
Glucose-Capillary: 113 mg/dL — ABNORMAL HIGH (ref 70–99)
Glucose-Capillary: 111 mg/dL — ABNORMAL HIGH (ref 70–99)
Glucose-Capillary: 126 mg/dL — ABNORMAL HIGH (ref 70–99)

## 2014-07-01 LAB — CBC WITH DIFFERENTIAL/PLATELET
Basophils Absolute: 0.4 10*3/uL — ABNORMAL HIGH (ref 0.0–0.1)
Basophils Relative: 1 % (ref 0–1)
Eosinophils Absolute: 0.4 10*3/uL (ref 0.0–0.7)
Eosinophils Relative: 1 % (ref 0–5)
HCT: 23.4 % — ABNORMAL LOW (ref 36.0–46.0)
Hemoglobin: 8.4 g/dL — ABNORMAL LOW (ref 12.0–15.0)
Lymphocytes Relative: 6 % — ABNORMAL LOW (ref 12–46)
Lymphs Abs: 2.6 10*3/uL (ref 0.7–4.0)
MCH: 31.1 pg (ref 26.0–34.0)
MCHC: 35.9 g/dL (ref 30.0–36.0)
MCV: 86.7 fL (ref 78.0–100.0)
Monocytes Absolute: 3.1 10*3/uL — ABNORMAL HIGH (ref 0.1–1.0)
Monocytes Relative: 7 % (ref 3–12)
Neutro Abs: 37.4 10*3/uL — ABNORMAL HIGH (ref 1.7–7.7)
Neutrophils Relative %: 85 % — ABNORMAL HIGH (ref 43–77)
Platelets: 228 10*3/uL (ref 150–400)
RBC: 2.7 MIL/uL — ABNORMAL LOW (ref 3.87–5.11)
RDW: 14.9 % (ref 11.5–15.5)
WBC: 43.9 10*3/uL — ABNORMAL HIGH (ref 4.0–10.5)

## 2014-07-01 LAB — HEPATITIS B CORE ANTIBODY, TOTAL: Hep B Core Total Ab: NONREACTIVE

## 2014-07-01 LAB — HEPATITIS B SURFACE ANTIBODY,QUALITATIVE: Hep B S Ab: POSITIVE — AB

## 2014-07-01 LAB — HIV ANTIBODY (ROUTINE TESTING W REFLEX): HIV 1&2 Ab, 4th Generation: NONREACTIVE

## 2014-07-01 LAB — HEPATITIS C ANTIBODY (REFLEX): HCV Ab: NEGATIVE

## 2014-07-01 LAB — HEPATITIS B SURFACE ANTIGEN: Hepatitis B Surface Ag: NEGATIVE

## 2014-07-01 SURGERY — INCISION AND DRAINAGE, ABSCESS
Anesthesia: General | Site: Thigh | Laterality: Left

## 2014-07-01 MED ORDER — 0.9 % SODIUM CHLORIDE (POUR BTL) OPTIME
TOPICAL | Status: DC | PRN
  Administered 2014-07-01: 1000 mL

## 2014-07-01 MED ORDER — PROPOFOL 10 MG/ML IV BOLUS
INTRAVENOUS | Status: DC | PRN
  Administered 2014-07-01: 100 mg via INTRAVENOUS

## 2014-07-01 MED ORDER — SUCCINYLCHOLINE CHLORIDE 20 MG/ML IJ SOLN
INTRAMUSCULAR | Status: DC | PRN
  Administered 2014-07-01: 120 mg via INTRAVENOUS

## 2014-07-01 MED ORDER — FENTANYL CITRATE 0.05 MG/ML IJ SOLN
INTRAMUSCULAR | Status: AC
Start: 2014-07-01 — End: 2014-07-01
  Filled 2014-07-01: qty 5

## 2014-07-01 MED ORDER — LACTATED RINGERS IV SOLN
INTRAVENOUS | Status: DC | PRN
  Administered 2014-07-01: 15:00:00 via INTRAVENOUS

## 2014-07-01 MED ORDER — DIPHENHYDRAMINE HCL 50 MG/ML IJ SOLN: 12.5000 mg | INTRAMUSCULAR | Status: DC | PRN

## 2014-07-01 MED ORDER — HYDROMORPHONE HCL 1 MG/ML IJ SOLN
0.5000 mg | INTRAMUSCULAR | Status: DC | PRN
  Administered 2014-07-03 – 2014-07-06 (×10): 1 mg via INTRAVENOUS
  Filled 2014-07-01 (×10): qty 1

## 2014-07-01 MED ORDER — MIDAZOLAM HCL 2 MG/2ML IJ SOLN
INTRAMUSCULAR | Status: AC
  Filled 2014-07-01: qty 2

## 2014-07-01 MED ORDER — PHENYLEPHRINE HCL 10 MG/ML IJ SOLN
INTRAMUSCULAR | Status: DC | PRN
  Administered 2014-07-01 (×3): 80 ug via INTRAVENOUS
  Administered 2014-07-01: 160 ug via INTRAVENOUS
  Administered 2014-07-01 (×2): 80 ug via INTRAVENOUS

## 2014-07-01 MED ORDER — FENTANYL CITRATE 0.05 MG/ML IJ SOLN
INTRAMUSCULAR | Status: DC | PRN
  Administered 2014-07-01: 150 ug via INTRAVENOUS

## 2014-07-01 MED ORDER — CEFAZOLIN SODIUM 1-5 GM-% IV SOLN
1.0000 g | Freq: Three times a day (TID) | INTRAVENOUS | Status: DC
  Administered 2014-07-01 – 2014-07-06 (×16): 1 g via INTRAVENOUS
  Filled 2014-07-01 (×19): qty 50

## 2014-07-01 MED ORDER — LIDOCAINE HCL (CARDIAC) 20 MG/ML IV SOLN
INTRAVENOUS | Status: DC | PRN
  Administered 2014-07-01: 100 mg via INTRAVENOUS

## 2014-07-01 MED ORDER — ONDANSETRON HCL 4 MG/2ML IJ SOLN
INTRAMUSCULAR | Status: DC | PRN
  Administered 2014-07-01: 4 mg via INTRAVENOUS

## 2014-07-01 MED ORDER — TRAMADOL HCL 50 MG PO TABS: 50.0000 mg | ORAL_TABLET | Freq: Four times a day (QID) | ORAL | Status: DC | PRN

## 2014-07-01 SURGICAL SUPPLY — 34 items
BNDG GAUZE ELAST 4 BULKY (GAUZE/BANDAGES/DRESSINGS) ×2 IMPLANT
CANISTER SUCTION 2500CC (MISCELLANEOUS) ×3 IMPLANT
COVER SURGICAL LIGHT HANDLE (MISCELLANEOUS) ×3 IMPLANT
DRAPE LAPAROSCOPIC ABDOMINAL (DRAPES) ×3 IMPLANT
DRAPE UTILITY 15X26 W/TAPE STR (DRAPE) ×6 IMPLANT
DRSG PAD ABDOMINAL 8X10 ST (GAUZE/BANDAGES/DRESSINGS) ×3 IMPLANT
ELECT CAUTERY BLADE 6.4 (BLADE) ×3 IMPLANT
ELECT REM PT RETURN 9FT ADLT (ELECTROSURGICAL) ×3
ELECTRODE REM PT RTRN 9FT ADLT (ELECTROSURGICAL) ×1 IMPLANT
GAUZE SPONGE 4X4 12PLY STRL (GAUZE/BANDAGES/DRESSINGS) ×3 IMPLANT
GLOVE BIO SURGEON STRL SZ7 (GLOVE) ×2 IMPLANT
GLOVE BIO SURGEON STRL SZ8 (GLOVE) ×3 IMPLANT
GLOVE BIOGEL PI IND STRL 6.5 (GLOVE) IMPLANT
GLOVE BIOGEL PI IND STRL 7.0 (GLOVE) IMPLANT
GLOVE BIOGEL PI IND STRL 8 (GLOVE) ×1 IMPLANT
GLOVE BIOGEL PI INDICATOR 6.5 (GLOVE) ×2
GLOVE BIOGEL PI INDICATOR 7.0 (GLOVE) ×2
GLOVE BIOGEL PI INDICATOR 8 (GLOVE) ×2
GLOVE BIOGEL PI ORTHO PRO SZ7 (GLOVE) ×2
GLOVE PI ORTHO PRO STRL SZ7 (GLOVE) IMPLANT
GLOVE SURG SS PI 6.5 STRL IVOR (GLOVE) ×2 IMPLANT
GOWN STRL REUS W/ TWL LRG LVL3 (GOWN DISPOSABLE) ×1 IMPLANT
GOWN STRL REUS W/ TWL XL LVL3 (GOWN DISPOSABLE) ×1 IMPLANT
GOWN STRL REUS W/TWL LRG LVL3 (GOWN DISPOSABLE) ×3
GOWN STRL REUS W/TWL XL LVL3 (GOWN DISPOSABLE) ×3
KIT BASIN OR (CUSTOM PROCEDURE TRAY) ×3 IMPLANT
KIT ROOM TURNOVER OR (KITS) ×3 IMPLANT
NS IRRIG 1000ML POUR BTL (IV SOLUTION) ×3 IMPLANT
PACK GENERAL/GYN (CUSTOM PROCEDURE TRAY) ×3 IMPLANT
PAD ARMBOARD 7.5X6 YLW CONV (MISCELLANEOUS) ×3 IMPLANT
SWAB COLLECTION DEVICE MRSA (MISCELLANEOUS) ×2 IMPLANT
TOWEL OR 17X24 6PK STRL BLUE (TOWEL DISPOSABLE) ×3 IMPLANT
TOWEL OR 17X26 10 PK STRL BLUE (TOWEL DISPOSABLE) ×3 IMPLANT
TUBE ANAEROBIC SPECIMEN COL (MISCELLANEOUS) ×2 IMPLANT

## 2014-07-01 NOTE — Op Note (Signed)
06/27/2014 - 07/01/2014  3:39 PM  PATIENT:  Kayla [name]  53 y.o. female  PRE-OPERATIVE DIAGNOSIS:  Large complex left thigh abscess - 14cm  POST-OPERATIVE DIAGNOSIS:  Large complex left thigh abscess - 14cm  PROCEDURE:  Procedure(s): INCISION AND DRAINAGE LARGE COMPLEX L THIGH ABSCESS 14CM  SURGEON:  Surgeon(s): Georganna Skeans, MD  ASSISTANTS: none   ANESTHESIA:   general  EBL:     BLOOD ADMINISTERED:none  DRAINS: none   SPECIMEN:  Excision  DISPOSITION OF SPECIMEN:  PATHOLOGY  COUNTS:  YES  DICTATION: .Dragon Dictation Patient is brought for emergent incision and drainage large complex left thigh abscess. She is on IV antibiotic protocol. Informed consent was obtained. She was identified in the preop holding area. She was brought to the operating room and general endotracheal anesthesia was administered by the anesthesia staff. She was placed in lithotomy position. Upper left thigh and groin area were prepped and draped in sterile fashion. Time out procedure was done. Attention was first directed to the large fluctuant area more anteriorly on the proximal thigh. Longitudinal incision was made in the postal cavity was entered. Cultures were sent. Loculations were broken up. The area was then irrigated. Next, the 2 cm patch of the skin further posteriorly on the proximal thigh was debrided out and I entered a pus filled cavity there as well. Further incision was made extending out towards the initial incision. These cavities connected. Further skin was debrided along the tract. All tissues were viable. This infection involved the subcutaneous fat plane. All frankly purulent tissue was debrided hemostasis was obtained with cautery. The wounds were then both copiously irrigated again with saline. No further trapped purulence was found. Hemostasis was again ensured. Both wounds were packed with saline-soaked Kerlix followed by sterile gauze, ABDs, and mesh panties. All counts were correct.  Patient tolerated the procedure well without apparent complication & was taken recovery in stable condition.  PATIENT DISPOSITION:  PACU - hemodynamically stable.   Delay start of Pharmacological VTE agent (>24hrs) due to surgical blood loss or risk of bleeding:  no  Georganna Skeans, MD, MPH, FACS Pager: [phone]  10/2/20153:39 PM

## 2014-07-01 NOTE — Progress Notes (Addendum)
Subjective:  No abscess found on yesterday's repeat CT pelvis. Denies any sob, cp, cough, n/v, fever, chills, headache, numbness, weakness, tingling.  Objective: Vital signs in last 24 hours: Filed Vitals:   06/30/14 1341 06/30/14 2125 07/01/14 0500 07/01/14 0505  BP: 136/58 123/62  137/59  Pulse: 81 101  96  Temp: 99.2 F (37.3 C) 99.5 F (37.5 C)  99.6 F (37.6 C)  TempSrc: Oral Oral  Oral  Resp: 18 18  18   Height:      Weight:   86.1 kg (189 lb 13.1 oz)   SpO2: 95% 95%  96%   Weight change: -0.5 kg (-1 lb 1.6 oz)  Intake/Output Summary (Last 24 hours) at 07/01/14 0720 Last data filed at 06/30/14 2150  Gross per 24 hour  Intake    240 ml  Output   1075 ml  Net   -835 ml   General: NAD HEENT: Center/at, anicteric sclera  Cardiac: RRR, no rubs, murmurs or gallops  Pulm: clear to auscultation bilaterally, no wheezes, rales, or rhonchi  Abd: soft, tender to palpation in LLQ, nondistended, BS present  Ext: warm and well perfused, no pedal edema, left groin/medial and posterior thigh with cellulitis moderate ttp.starting to coalesce into a nodule.  No drainage, warmth, or erythema. There is a smaller cyst on the posterior left thigh near her labium which drained clear fluid.  Neuro: alert and oriented X3, cranial nerves II-XII grossly intact. 4/5 strength to extension on right arm.   Lab Results: Basic Metabolic Panel:  Recent Labs Lab 06/29/14 1350 06/30/14 0730  NA 127* 131*  K 3.7 3.7  CL 91* 99  CO2 21 20  GLUCOSE 85 116*  BUN 33* 28*  CREATININE 2.31* 1.87*  CALCIUM 10.0 9.2   Liver Function Tests:  Recent Labs Lab 06/29/14 0645 06/30/14 0730  AST 117* 71*  ALT 80* 63*  ALKPHOS 255* 291*  BILITOT 2.6* 2.3*  PROT 6.7 7.1  ALBUMIN 1.9* 1.8*   CBC:  Recent Labs Lab 06/29/14 0645 06/30/14 0730  WBC 35.2* 40.0*  NEUTROABS 31.7* 35.2*  HGB 8.1* 8.5*  HCT 23.2* 23.8*  MCV 87.5 86.2  PLT 207 207   Cardiac Enzymes:  Recent Labs Lab  06/27/14 1725  TROPONINI <0.30   CBG:  Recent Labs Lab 06/29/14 2139 06/30/14 0744 06/30/14 1147 06/30/14 1703 06/30/14 2127 06/30/14 2255  GLUCAP 143* 130* 137* 113* 80 142*   Hemoglobin A1C:  Recent Labs Lab 06/27/14 1725  HGBA1C 9.7*   Urinalysis:  Recent Labs Lab 06/27/14 2234  COLORURINE AMBER*  LABSPEC 1.031*  PHURINE 5.5  GLUCOSEU >1000*  HGBUR SMALL*  BILIRUBINUR SMALL*  KETONESUR 15*  PROTEINUR 100*  UROBILINOGEN 1.0  NITRITE POSITIVE*  LEUKOCYTESUR NEGATIVE    Micro Results: Recent Results (from the past 240 hour(s))  CULTURE, BLOOD (ROUTINE X 2)     Status: None   Collection Time    06/27/14  4:55 PM      Result Value Ref Range Status   Specimen Description BLOOD RIGHT ARM   Final   Special Requests BOTTLES DRAWN AEROBIC AND ANAEROBIC 5CC   Final   Culture  Setup Time     Final   Value: 06/27/2014 22:09     Performed at [facility]   Culture     Final   Value:        BLOOD CULTURE RECEIVED NO GROWTH TO DATE CULTURE WILL BE HELD FOR 5 DAYS BEFORE ISSUING A FINAL  NEGATIVE REPORT     Performed at [facility]   Report Status PENDING   Incomplete  CULTURE, BLOOD (ROUTINE X 2)     Status: None   Collection Time    06/27/14  9:06 PM      Result Value Ref Range Status   Specimen Description BLOOD LEFT WRIST   Final   Special Requests BOTTLES DRAWN AEROBIC ONLY 5CC   Final   Culture  Setup Time     Final   Value: 06/28/2014 00:26     Performed at [facility]   Culture     Final   Value:        BLOOD CULTURE RECEIVED NO GROWTH TO DATE CULTURE WILL BE HELD FOR 5 DAYS BEFORE ISSUING A FINAL NEGATIVE REPORT     Performed at [facility]   Report Status PENDING   Incomplete  URINE CULTURE     Status: None   Collection Time    06/27/14 10:34 PM      Result Value Ref Range Status   Specimen Description URINE, CLEAN CATCH   Final   Special Requests NONE   Final   Culture  Setup Time     Final   Value: 06/27/2014  23:11     Performed at [facility]     Final   Value: 2,000 COLONIES/ML     Performed at [facility]   Culture     Final   Value: INSIGNIFICANT GROWTH     Performed at [facility]   Report Status 06/28/2014 FINAL   Final   Studies/Results: Ct Pelvis W Contrast  06/30/2014   CLINICAL DATA:  Pelvic cellulitis.  Evaluate for abscess.  EXAM: CT PELVIS WITH CONTRAST  TECHNIQUE: Multidetector CT imaging of the pelvis was performed using the standard protocol following the bolus administration of intravenous contrast.  CONTRAST:  77mL OMNIPAQUE IOHEXOL 300 MG/ML  SOLN  COMPARISON:  06/27/2014  FINDINGS: Persistent edema is seen in subcutaneous tissues of the left labia majora and the proximal left thigh and medially and anteriorly. The shows minimal or no significant change. No abscess visualized. Mild lymphadenopathy is seen in the left inguinal region and bilateral external iliac chains, left side greater than right, which is unchanged and likely reactive in etiology. No other pelvic masses identified.  Uterus and adnexal regions are unremarkable. No intrapelvic inflammatory process or abnormal fluid collections identified. No suspicious bone lesions identified.  IMPRESSION: No significant change in subcutaneous edema involving the proximal left thigh and left labia majora. No soft tissue abscess identified.  Stable mild left inguinal and bilateral external iliac lymphadenopathy, likely reactive in etiology.   Electronically Signed   By: Earle Gell M.D.   On: 06/30/2014 16:36   US Abdomen Complete  06/29/2014   CLINICAL DATA:  Elevated bilirubin.  Abdominal pain.  EXAM: ULTRASOUND ABDOMEN COMPLETE  COMPARISON:  Ultrasound, 08/18/2012.  FINDINGS: Gallbladder:  No gallstones or wall thickening visualized. No sonographic Murphy sign noted.  Common bile duct:  Diameter: 5.9 mm  Liver:  No focal lesion identified. Within normal limits in parenchymal echogenicity.  IVC:   No abnormality visualized.  Pancreas:  Hypoechoic rounded area lies along the tail the pancreas. This is most likely directly adjacent bowel. A pancreatic mass is not excluded, however. Remainder the pancreas is unremarkable.  Spleen:  Size and appearance within normal limits.  Right Kidney:  Length: 11.8 cm. 14 mm lower  pole cyst. Normal parenchymal echogenicity. No stone. No hydronephrosis.  Left Kidney:  Length: 11.2 cm. Echogenicity within normal limits. No mass or hydronephrosis visualized.  Abdominal aorta:  No aneurysm visualized.  Other findings:  None.  IMPRESSION: 1. No acute findings.  Normal gallbladder.  No bile duct dilation. 2. Round, hypoechoic lesion versus bowel lies along the tail the pancreas. Since a pancreatic mass is not excluded, followup imaging with contrast enhanced CT of the abdomen is recommended. 3. 14 mm right renal cyst.  No other abnormalities.   Electronically Signed   By: Lajean Manes M.D.   On: 06/29/2014 12:41   Medications: I have reviewed the patient's current medications. Scheduled Meds: . clindamycin  300 mg Oral 4 times per day  . DULoxetine  60 mg Oral Daily  . enoxaparin (LOVENOX) injection  30 mg Subcutaneous Q24H  . gabapentin  300 mg Oral QID  . insulin aspart  0-20 Units Subcutaneous TID WC  . insulin glargine  40 Units Subcutaneous QHS  . levothyroxine  100 mcg Oral QAC breakfast  . pantoprazole  40 mg Oral Daily  . polyethylene glycol  17 g Oral Daily  . senna-docusate  1 tablet Oral QHS  . sodium chloride  3 mL Intravenous Q12H  . zolpidem  5 mg Oral QHS   Continuous Infusions: . sodium chloride 100 mL/hr at 07/01/14 0601   PRN Meds:.acetaminophen, HYDROcodone-acetaminophen, naphazoline-pheniramine, ondansetron Assessment/Plan: Principal Problem:   Cellulitis of groin Active Problems:   HYPOTHYROIDISM   DM type 2, uncontrolled, with severe neuropathy   Fibromyalgia syndrome   Essential hypertension, benign   Dysuria   Unspecified  constipation   Lymphadenopathy   Sepsis   Hyperglycemia   Unspecified protein-calorie malnutrition   Liver function test abnormality   Acute renal failure superimposed on stage 3 chronic kidney disease   Hyponatremia   Leukocytosis  #Sepsis secondary to cellulitis of the medial proximal left thigh extending to the perineum  no abscess seen on CT pelvis initially. But WBC keeps trending up to 43.9 today. Remains afebrile though. -previously on vanc/zosyn, now on clindamycin PO. Will switch him to cefazolin today and see if it improves the WBC. Will do keflex on discharge if better. -prn Norco 5-325 mg 2 tablets q6 prn pain - BCX NGTD. - will contact surgery team to see if they can possible drain the possible abscess even though CT didn't show any abscess. Will await their recs  # Leukocytosis - normal WBC on may 2015. Admitted with WBC in 30.1. Going up daily with 43.9 today. With high neutrophils.reviewed smear with Dr. Beryle Beams - has mature neutrophils with toxic granulations likely 2/2 to infection. But she also has lot of early cells such as myelocytes and promyelocytes, targe cells and rare nucleated RBCs - will see if WBC improves with above. If not, may be developing early CML. Will pursue CML w/up later including bcr-ABL if above fails.  #Acute on chronic CKD: FENa 0.5% - prerenal. Increased to 2.35 but Crt trending down to 1.87.  Baseline around 1.6. -continue to hold home olmesartan as BP is low in 100's.   #DM type 2, uncontrolled, with severe neuropathy with hyperglycemia: Hgb A1c 9.7.came in with bg 447. Today doing better on SSI-R QID, Lantus 40 u qhs   -Will need outpatient follow up with [facility]   #Hyperbilirubinemia, elevated LFTs: T bili and AST/ALT both trending down -US Abdomen Normal gallbladder. No bile duct dilation. Round, hypoechoic lesion along pancreas tail, likely  bowel gas. -Hep panel pending. Hemochromatosis negative.   #Essential hypertension,  benign  -Currently normotensive. -continue to hold home olmesartan as BP is low in 100's.  -monitor BP   #UTI: Insignificant growth on UCx, no further need for abx.  #Chest pain: likely MSK, resolved -Protonix 40 meq qd  #Unspecified constipation  -Senna-S qhs  -Miralax daily  #Hypothyroidism -Continue Synthyroid 100 mcg qd   #Fibromyalgia  -Continue Cymbalta 60 mg daily, Neurontin 300 mg qid  #Headache  -prn Tylenol 325 daily   #Nausea  -prn Zofran q6 hours   #Insomina  -continued home Ambien 5 mg qhs. Only uses as needed.   FEN: Continues to have hyponatremia but improving. -H/H carb modified diet   DVT ppx  -Lovenox, scds  Dispo: Disposition is deferred at this time, awaiting improvement of current medical problems.  Anticipated discharge in approximately 2 day(s).   The patient does have a current PCP Luan Moore, MD) and does need an [facility] hospital follow-up appointment after discharge.  The patient does not have transportation limitations that hinder transportation to clinic appointments.  .Services Needed at time of discharge: Y = Yes, Blank = No PT:   OT:   RN:   Equipment:   Other:     LOS: 4 days   Dellia Nims, MD 07/01/2014, 7:20 AM

## 2014-07-01 NOTE — Consult Note (Signed)
High WBC and abscess upper L thigh. CT pelvis yesterday only showing cellulitis stopped just above this abscess. To OR for emergent I&D. Procedure, risks, and benefits D/W her and she agrees. Patient examined and I agree with the assessment and plan  Georganna Skeans, MD, MPH, FACS Trauma: [phone] General Surgery: [phone]  07/01/2014 2:23 PM

## 2014-07-01 NOTE — Progress Notes (Signed)
Patient ID: Kayla [name], female   DOB: [birth year], 53 y.o.   MRN: [id number] Medicine attending: I personally interviewed and examined this patient today together with medical resident Dr. Dellia Nims. We reviewed with radiology the followup CT scan. Although there have been no changes and no definite abscess formation, the scan really did not go low enough to include all of the affected area. On my exam today, there is a high suspicion for a developing abscess in the medial thigh. The patient's white count continues to rise. We will ask for a surgical evaluation for advice on the need for incision and drainage. I reviewed the peripheral blood film with the resident. There is a predominant neutrophilia with toxic granulations in the neutrophils and other signs of marrow stress with presence of myelocytes, rare promyelocyte, and a rare nucleated red blood cell. Of note the patient had a normal white count and differential on 02/25/2014 0 clinical suspicion that this is a leukemic process is very low. Persistent elevation of her white count supports and physical findings support that she likely has a cutaneous abscess.  Murriel Hopper, MD, [place]  Hematology-Oncology/Internal Medicine

## 2014-07-01 NOTE — Anesthesia Preprocedure Evaluation (Signed)
Anesthesia Evaluation  Patient identified by MRN, date of birth, ID band Patient awake    Reviewed: Allergy & Precautions, H&P , NPO status , Patient's Chart, lab work & pertinent test results  Airway Mallampati: III TM Distance: >3 FB Neck ROM: Full    Dental  (+) Teeth Intact, Chipped   Pulmonary former smoker,          Cardiovascular hypertension, Pt. on medications and Pt. on home beta blockers Rhythm:Regular Rate:Normal     Neuro/Psych  Headaches, PSYCHIATRIC DISORDERS Anxiety Depression    GI/Hepatic   Endo/Other  diabetes, Type 2, Insulin Dependent  Renal/GU Renal InsufficiencyRenal disease     Musculoskeletal  (+) Arthritis -, Fibromyalgia -  Abdominal   Peds  Hematology  (+) anemia ,   Anesthesia Other Findings   Reproductive/Obstetrics                           Anesthesia Physical Anesthesia Plan  ASA: III  Anesthesia Plan: General   Post-op Pain Management:    Induction: Intravenous, Cricoid pressure planned and Rapid sequence  Airway Management Planned: Oral ETT  Additional Equipment:   Intra-op Plan:   Post-operative Plan: Extubation in OR  Informed Consent: I have reviewed the patients History and Physical, chart, labs and discussed the procedure including the risks, benefits and alternatives for the proposed anesthesia with the patient or authorized representative who has indicated his/her understanding and acceptance.   Dental advisory given  Plan Discussed with: Anesthesiologist and Surgeon  Anesthesia Plan Comments:         Anesthesia Quick Evaluation

## 2014-07-01 NOTE — Consult Note (Signed)
[facility] Surgery Consult Note  Kayla [name] 03/14/1961  [id number].    Requesting MD: Dr. Beryle Beams Chief Complaint/Reason for Consult: Left medial proximal thigh cellulitis  HPI:  53 year old woman with history of DM2 with 2* neuropathy, fibromyalgia presenting with left medial proximal thigh swelling, redness, and tenderness. She first noticed a small bump 1-2 weeks ago. She tried epson salt soak, hydrogen peroxide and warm compress, but it progressed to be very large.  Swelling extends to her perineum and inguinal crease to medial buttock.  She also notes some blistered areas which have opened up and started to weep.  Denies drainage, trauma, bite, or scratch to the area.  She has trouble moving around in bed/walking because the lump is so large and painful.  No other precipitating/alleviating factors or radiating pain.  She previously had an abscess of both labia and left buttock 4-6 years ago and underwent I&D.  She had fever to 100.5 Friday 06/24/14, but broke on Saturday.  Notes chills, nausea, and sweats, but no vomiting, diarrhea, bowel or bladder problems, or CP/SOB currently.     ROS: All systems reviewed and otherwise negative except for as above  Family History  Problem Relation Age of Onset  . Emphysema Mother   . Cancer Mother     unknown  . Hypertension Mother   . Cancer Father     pancreatic   . Diabetes Father   . Hypertension Father   . Down syndrome Brother   . Cancer Maternal Aunt     cancer  . Cancer Other     breast cancer aunt  . Stroke Sister   . Diabetes Sister   . Colon cancer Neg Hx   . Stomach cancer Neg Hx     Past Medical History  Diagnosis Date  . ANA POSITIVE 02/09/2010  . Hyperlipidemia   . Dizziness     Multiple falls at home  . Sickle cell trait   . Fibromyalgia   . Hypothyroidism   . 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 Anal warts 11/09/2012  . Hypertension   . Heart murmur   . Chronic bronchitis     "get it changing of the seasons; sometimes q yr; sometimes not"   . COPD (chronic obstructive pulmonary disease)   . Graves' disease     "they nuked it twice"  . MAUQJFHL(456.2)     "monthly" (06/27/2014)  . Arthritis     "my bones ache all the time" (06/27/2014)  . Type 2 diabetes mellitus   . Diabetic peripheral neuropathy since <2005    "hands and feet"  . Anxiety   . Cellulitis 06/27/2014    medial proximal left thigh extending to the perineium    Past Surgical History  Procedure Laterality Date  . Appendectomy  1986  . Cesarean section  1986, 1993  . Dilation and curettage of uterus  1990's  . Ankle fracture surgery Left 2008    "didn't put hardware in"  . Fracture surgery      Social History:  reports that she quit smoking about 23 years ago. Her smoking use included Cigarettes. She has a 3.75 pack-year smoking history. She has never used smokeless tobacco. She reports that she does not drink alcohol or use illicit drugs.  Allergies:  Allergies  Allergen Reactions  . Ace Inhibitors     Dizziness, nausea  . Percocet [Oxycodone-Acetaminophen] Itching  . Statins   . Sulfamethoxazole Rash  .  Sulfites Rash    Medications Prior to Admission  Medication Sig Dispense Refill  . DULoxetine (CYMBALTA) 30 MG capsule Take 2 capsules (60 mg total) by mouth daily.  180 capsule  0  . gabapentin (NEURONTIN) 300 MG capsule Take 2 capsules (600 mg total) by mouth 4 (four) times daily.  240 capsule  2  . insulin glargine (LANTUS) 100 UNIT/ML injection Inject 20 Units into the skin 2 (two) times daily.      Marland Kitchen levothyroxine (SYNTHROID, LEVOTHROID) 100 MCG tablet Take 1 tablet (100 mcg total) by mouth daily before breakfast.  30 tablet  11  . olmesartan (BENICAR) 40 MG tablet Take 40 mg by mouth daily as needed. Patient only takes if she feels her blood pressure is high, will have daughter check if over 130 patient will take medication.      Marland Kitchen zolpidem (AMBIEN) 5 MG tablet Take 5 mg by  mouth at bedtime as needed for sleep.      Marland Kitchen ACCU-CHEK FASTCLIX LANCETS MISC 1 each by Does not apply route 4 (four) times daily -  before meals and at bedtime.  100 each  11  . Blood Glucose Monitoring Suppl (ACCU-CHEK AVIVA PLUS) W/DEVICE KIT 1 kit by Does not apply route as directed.  1 kit  0  . Blood Glucose Monitoring Suppl (ACCU-CHEK NANO SMARTVIEW) W/DEVICE KIT 1 each by Does not apply route 4 (four) times daily -  before meals and at bedtime.  1 kit  0  . guaiFENesin-dextromethorphan (ROBITUSSIN DM) 100-10 MG/5ML syrup Take 5 mLs by mouth every 4 (four) hours as needed for cough.  118 mL  0  . insulin aspart (NOVOLOG FLEXPEN) 100 UNIT/ML injection Inject 3-5 Units into the skin 3 (three) times daily before meals.  3 vial  4    Blood pressure 137/59, pulse 96, temperature 99.6 F (37.6 C), temperature source Oral, resp. rate 18, height 5' 1.81" (1.57 m), weight 189 lb 13.1 oz (86.1 kg), last menstrual period 01/12/2014, SpO2 96.00%. Physical Exam: General: pleasant, WD/WN AA female who is laying in bed in NAD HEENT: head is normocephalic, atraumatic.  Sclera are noninjected.  PERRL.  Ears and nose without any masses or lesions.  Mouth is pink and moist Heart: regular, rate, and rhythm.  No obvious murmurs, gallops, or rubs noted.  Palpable pedal pulses bilaterally Lungs: CTAB, no wheezes, rhonchi, or rales noted.  Respiratory effort nonlabored Abd: soft, NT/ND, +BS, no masses, hernias, or organomegaly MS: all 4 extremities are symmetrical with no cyanosis, clubbing, or edema. Skin: warm and dry, 7cm x 5cm x 3cm raised mass with erythema and induration.  The mass is hard and not really fluctuant.  More posteriorly from the mass are 2 areas of blistering with exfoliation of the skin/ulceration. Psych: A&Ox3 with an appropriate affect.   07/01/14    Results for orders placed during the hospital encounter of 06/27/14 (from the past 48 hour(s))  BASIC METABOLIC PANEL     Status: Abnormal     Collection Time    06/29/14  1:50 PM      Result Value Ref Range   Sodium 127 (*) 137 - 147 mEq/L   Potassium 3.7  3.7 - 5.3 mEq/L   Chloride 91 (*) 96 - 112 mEq/L   CO2 21  19 - 32 mEq/L   Glucose, Bld 85  70 - 99 mg/dL   BUN 33 (*) 6 - 23 mg/dL   Creatinine, Ser 2.31 (*) 0.50 - 1.10  mg/dL   Calcium 10.0  8.4 - 10.5 mg/dL   GFR calc non Af Amer 23 (*) >90 mL/min   GFR calc Af Amer 27 (*) >90 mL/min   Comment: (NOTE)     The eGFR has been calculated using the CKD EPI equation.     This calculation has not been validated in all clinical situations.     eGFR's persistently <90 mL/min signify possible Chronic Kidney     Disease.   Anion gap 15  5 - 15  GLUCOSE, CAPILLARY     Status: Abnormal   Collection Time    06/29/14  5:23 PM      Result Value Ref Range   Glucose-Capillary 126 (*) 70 - 99 mg/dL  GLUCOSE, CAPILLARY     Status: Abnormal   Collection Time    06/29/14  9:28 PM      Result Value Ref Range   Glucose-Capillary 137 (*) 70 - 99 mg/dL  GLUCOSE, CAPILLARY     Status: Abnormal   Collection Time    06/29/14  9:39 PM      Result Value Ref Range   Glucose-Capillary 143 (*) 70 - 99 mg/dL  COMPREHENSIVE METABOLIC PANEL     Status: Abnormal   Collection Time    06/30/14  7:30 AM      Result Value Ref Range   Sodium 131 (*) 137 - 147 mEq/L   Potassium 3.7  3.7 - 5.3 mEq/L   Chloride 99  96 - 112 mEq/L   CO2 20  19 - 32 mEq/L   Glucose, Bld 116 (*) 70 - 99 mg/dL   BUN 28 (*) 6 - 23 mg/dL   Creatinine, Ser 1.87 (*) 0.50 - 1.10 mg/dL   Calcium 9.2  8.4 - 10.5 mg/dL   Total Protein 7.1  6.0 - 8.3 g/dL   Albumin 1.8 (*) 3.5 - 5.2 g/dL   AST 71 (*) 0 - 37 U/L   ALT 63 (*) 0 - 35 U/L   Alkaline Phosphatase 291 (*) 39 - 117 U/L   Total Bilirubin 2.3 (*) 0.3 - 1.2 mg/dL   GFR calc non Af Amer 30 (*) >90 mL/min   GFR calc Af Amer 35 (*) >90 mL/min   Comment: (NOTE)     The eGFR has been calculated using the CKD EPI equation.     This calculation has not been validated  in all clinical situations.     eGFR's persistently <90 mL/min signify possible Chronic Kidney     Disease.   Anion gap 12  5 - 15  CBC     Status: Abnormal   Collection Time    06/30/14  7:30 AM      Result Value Ref Range   WBC 40.0 (*) 4.0 - 10.5 K/uL   Comment: PLATELET COUNT CONFIRMED BY SMEAR   RBC 2.76 (*) 3.87 - 5.11 MIL/uL   Hemoglobin 8.5 (*) 12.0 - 15.0 g/dL   Comment: REPEATED TO VERIFY     CONSISTENT WITH PREVIOUS RESULT   HCT 23.8 (*) 36.0 - 46.0 %   MCV 86.2  78.0 - 100.0 fL   MCH 30.8  26.0 - 34.0 pg   MCHC 35.7  30.0 - 36.0 g/dL   RDW 14.3  11.5 - 15.5 %   Platelets 207  150 - 400 K/uL   Comment: REPEATED TO VERIFY     PLATELET COUNT CONFIRMED BY SMEAR     SPECIMEN CHECKED FOR CLOTS  HEPATITIS  B SURFACE ANTIBODY     Status: Abnormal   Collection Time    06/30/14  7:30 AM      Result Value Ref Range   Hep B S Ab POSITIVE (*) NEGATIVE   Comment: Performed at [facility]  HEPATITIS B SURFACE ANTIGEN     Status: None   Collection Time    06/30/14  7:30 AM      Result Value Ref Range   Hepatitis B Surface Ag NEGATIVE  NEGATIVE   Comment: Performed at [facility]  HEPATITIS B CORE ANTIBODY, TOTAL     Status: None   Collection Time    06/30/14  7:30 AM      Result Value Ref Range   Hep B Core Total Ab NON REACTIVE  NON REACTIVE   Comment: Performed at [facility]  HEPATITIS C ANTIBODY (REFLEX)     Status: None   Collection Time    06/30/14  7:30 AM      Result Value Ref Range   HCV Ab NEGATIVE  NEGATIVE   Comment: (NOTE)     Effective August 15, 2014, Hepatitis C Antibody with Reflex to HCV     RNA, Qualitative, Real-Time PCR (test code 02286) will be     discontinued, and automatically replaced with test code 14301     Hepatitis C Antibody with Reflex to HCV RNA, Quantitative, Real-Time     PCR. This change will also be reflected in standard and custom     profiles that currently include test code 56349.     Performed at  [facility]  IRON AND TIBC     Status: Abnormal   Collection Time    06/30/14  7:30 AM      Result Value Ref Range   Iron 70  42 - 135 ug/dL   TIBC 383 (*) 067 - 882 ug/dL   Saturation Ratios 37  20 - 55 %   UIBC 119 (*) 125 - 400 ug/dL   Comment: Performed at [facility]  DIFFERENTIAL     Status: Abnormal   Collection Time    06/30/14  7:30 AM      Result Value Ref Range   Neutrophils Relative % 88 (*) 43 - 77 %   Lymphocytes Relative 5 (*) 12 - 46 %   Monocytes Relative 5  3 - 12 %   Eosinophils Relative 1  0 - 5 %   Basophils Relative 1  0 - 1 %   Neutro Abs 35.2 (*) 1.7 - 7.7 K/uL   Lymphs Abs 2.0  0.7 - 4.0 K/uL   Monocytes Absolute 2.0 (*) 0.1 - 1.0 K/uL   Eosinophils Absolute 0.4  0.0 - 0.7 K/uL   Basophils Absolute 0.4 (*) 0.0 - 0.1 K/uL   RBC Morphology POLYCHROMASIA PRESENT     Comment: TARGET CELLS   WBC Morphology TOXIC GRANULATION     Comment: VACUOLATED NEUTROPHILS     MILD LEFT SHIFT (1-5% METAS, OCC MYELO, OCC BANDS)     MODERATE LEFT SHIFT (>5% METAS AND MYELOS,OCC PRO NOTED)  GLUCOSE, CAPILLARY     Status: Abnormal   Collection Time    06/30/14  7:44 AM      Result Value Ref Range   Glucose-Capillary 130 (*) 70 - 99 mg/dL  GLUCOSE, CAPILLARY     Status: Abnormal   Collection Time    06/30/14 11:47 AM      Result Value Ref Range  Glucose-Capillary 137 (*) 70 - 99 mg/dL  GLUCOSE, CAPILLARY     Status: Abnormal   Collection Time    06/30/14  5:03 PM      Result Value Ref Range   Glucose-Capillary 113 (*) 70 - 99 mg/dL  GLUCOSE, CAPILLARY     Status: None   Collection Time    06/30/14  9:27 PM      Result Value Ref Range   Glucose-Capillary 80  70 - 99 mg/dL   Comment 1 Documented in Chart     Comment 2 Notify RN    GLUCOSE, CAPILLARY     Status: Abnormal   Collection Time    06/30/14 10:55 PM      Result Value Ref Range   Glucose-Capillary 142 (*) 70 - 99 mg/dL   Comment 1 Documented in Chart     Comment 2 Notify RN    HIV  ANTIBODY (ROUTINE TESTING)     Status: None   Collection Time    07/01/14  5:00 AM      Result Value Ref Range   HIV 1&2 Ab, 4th Generation NONREACTIVE  NONREACTIVE   Comment: (NOTE)     A NONREACTIVE HIV Ag/Ab result does not exclude HIV infection since     the time frame for seroconversion is variable. If acute HIV infection     is suspected, a HIV-1 RNA Qualitative TMA test is recommended.     HIV-1/2 Antibody Diff         Not indicated.     HIV-1 RNA, Qual TMA           Not indicated.     PLEASE NOTE: This information has been disclosed to you from records     whose confidentiality may be protected by state law. If your state     requires such protection, then the state law prohibits you from making     any further disclosure of the information without the specific written     consent of the person to whom it pertains, or as otherwise permitted     by law. A general authorization for the release of medical or other     information is NOT sufficient for this purpose.     The performance of this assay has not been clinically validated in     patients less than 108 years old.     Performed at [place], [facility]     Status: Abnormal   Collection Time    07/01/14  8:13 AM      Result Value Ref Range   Glucose-Capillary 101 (*) 70 - 99 mg/dL  CBC WITH DIFFERENTIAL     Status: Abnormal   Collection Time    07/01/14  9:44 AM      Result Value Ref Range   WBC 43.9 (*) 4.0 - 10.5 K/uL   Comment: WHITE COUNT CONFIRMED ON SMEAR   RBC 2.70 (*) 3.87 - 5.11 MIL/uL   Hemoglobin 8.4 (*) 12.0 - 15.0 g/dL   HCT 23.4 (*) 36.0 - 46.0 %   MCV 86.7  78.0 - 100.0 fL   MCH 31.1  26.0 - 34.0 pg   MCHC 35.9  30.0 - 36.0 g/dL   RDW 14.9  11.5 - 15.5 %   Platelets 228  150 - 400 K/uL   Comment: PLATELET COUNT CONFIRMED BY SMEAR   Neutrophils Relative % 85 (*) 43 - 77 %   Lymphocytes Relative 6 (*) 12 -  46 %   Monocytes Relative 7  3 - 12 %   Eosinophils Relative 1  0 - 5 %    Basophils Relative 1  0 - 1 %   Neutro Abs 37.4 (*) 1.7 - 7.7 K/uL   Lymphs Abs 2.6  0.7 - 4.0 K/uL   Monocytes Absolute 3.1 (*) 0.1 - 1.0 K/uL   Eosinophils Absolute 0.4  0.0 - 0.7 K/uL   Basophils Absolute 0.4 (*) 0.0 - 0.1 K/uL   RBC Morphology POLYCHROMASIA PRESENT     Comment: TARGET CELLS     RARE NRBCs   WBC Morphology TOXIC GRANULATION     Comment: MODERATE LEFT SHIFT (>5% METAS AND MYELOS,OCC PRO NOTED)     VACUOLATED NEUTROPHILS  COMPREHENSIVE METABOLIC PANEL     Status: Abnormal   Collection Time    07/01/14  9:44 AM      Result Value Ref Range   Sodium 135 (*) 137 - 147 mEq/L   Potassium 4.8  3.7 - 5.3 mEq/L   Comment: HEMOLYSIS AT THIS LEVEL MAY AFFECT RESULT   Chloride 104  96 - 112 mEq/L   CO2 19  19 - 32 mEq/L   Glucose, Bld 92  70 - 99 mg/dL   BUN 16  6 - 23 mg/dL   Creatinine, Ser 2.41 (*) 0.50 - 1.10 mg/dL   Calcium 9.2  8.4 - 75.3 mg/dL   Total Protein 7.1  6.0 - 8.3 g/dL   Albumin 1.9 (*) 3.5 - 5.2 g/dL   AST 58 (*) 0 - 37 U/L   Comment: HEMOLYSIS AT THIS LEVEL MAY AFFECT RESULT   ALT 51 (*) 0 - 35 U/L   Comment: HEMOLYSIS AT THIS LEVEL MAY AFFECT RESULT   Alkaline Phosphatase 337 (*) 39 - 117 U/L   Total Bilirubin 2.6 (*) 0.3 - 1.2 mg/dL   GFR calc non Af Amer 45 (*) >90 mL/min   GFR calc Af Amer 52 (*) >90 mL/min   Comment: (NOTE)     The eGFR has been calculated using the CKD EPI equation.     This calculation has not been validated in all clinical situations.     eGFR's persistently <90 mL/min signify possible Chronic Kidney     Disease.   Anion gap 12  5 - 15  GLUCOSE, CAPILLARY     Status: Abnormal   Collection Time    07/01/14 12:05 PM      Result Value Ref Range   Glucose-Capillary 126 (*) 70 - 99 mg/dL   Ct Pelvis W Contrast  06/30/2014   CLINICAL DATA:  Pelvic cellulitis.  Evaluate for abscess.  EXAM: CT PELVIS WITH CONTRAST  TECHNIQUE: Multidetector CT imaging of the pelvis was performed using the standard protocol following the bolus  administration of intravenous contrast.  CONTRAST:  59mL OMNIPAQUE IOHEXOL 300 MG/ML  SOLN  COMPARISON:  06/27/2014  FINDINGS: Persistent edema is seen in subcutaneous tissues of the left labia majora and the proximal left thigh and medially and anteriorly. The shows minimal or no significant change. No abscess visualized. Mild lymphadenopathy is seen in the left inguinal region and bilateral external iliac chains, left side greater than right, which is unchanged and likely reactive in etiology. No other pelvic masses identified.  Uterus and adnexal regions are unremarkable. No intrapelvic inflammatory process or abnormal fluid collections identified. No suspicious bone lesions identified.  IMPRESSION: No significant change in subcutaneous edema involving the proximal left thigh and left labia majora. No  soft tissue abscess identified.  Stable mild left inguinal and bilateral external iliac lymphadenopathy, likely reactive in etiology.   Electronically Signed   By: Earle Gell M.D.   On: 06/30/2014 16:36      Assessment/Plan Left thigh abscess/cellulitis/mass Leukocytosis - 43.9 Type 2 diabetes insulin dependent  Plan: 1.  CT of pelvis likely did not get low enough to evaluate this section thigh.  Does need Incision, drainage, and/or debridement in OR.   2.  Will need HH at discharge, will likely have a sizable wound    Coralie Keens, [facility] Surgery 07/01/2014, 1:27 PM Pager: [phone]

## 2014-07-01 NOTE — Anesthesia Procedure Notes (Signed)
Procedure Name: Intubation Date/Time: 07/01/2014 2:47 PM Performed by: Manuela Schwartz B Pre-anesthesia Checklist: Patient identified, Emergency Drugs available, Suction available, Patient being monitored and Timeout performed Patient Re-evaluated:Patient Re-evaluated prior to inductionOxygen Delivery Method: Circle system utilized Preoxygenation: Pre-oxygenation with 100% oxygen Intubation Type: IV induction, Rapid sequence and Cricoid Pressure applied Laryngoscope Size: Mac and 3 Grade View: Grade I Tube type: Oral Tube size: 7.5 mm Number of attempts: 1 Airway Equipment and Method: Stylet Placement Confirmation: ETT inserted through vocal cords under direct vision,  positive ETCO2 and breath sounds checked- equal and bilateral Secured at: 21 cm Tube secured with: Tape Dental Injury: Teeth and Oropharynx as per pre-operative assessment

## 2014-07-02 DIAGNOSIS — L02416 Cutaneous abscess of left lower limb: Secondary | ICD-10-CM

## 2014-07-02 LAB — CBC WITH DIFFERENTIAL/PLATELET
Band Neutrophils: 0 % (ref 0–10)
Basophils Absolute: 0 10*3/uL (ref 0.0–0.1)
Basophils Relative: 0 % (ref 0–1)
Blasts: 0 %
Eosinophils Absolute: 0 10*3/uL (ref 0.0–0.7)
Eosinophils Relative: 0 % (ref 0–5)
HCT: 20.5 % — ABNORMAL LOW (ref 36.0–46.0)
Hemoglobin: 7.4 g/dL — ABNORMAL LOW (ref 12.0–15.0)
Lymphocytes Relative: 10 % — ABNORMAL LOW (ref 12–46)
Lymphs Abs: 3.7 10*3/uL (ref 0.7–4.0)
MCH: 31.4 pg (ref 26.0–34.0)
MCHC: 36.1 g/dL — ABNORMAL HIGH (ref 30.0–36.0)
MCV: 86.9 fL (ref 78.0–100.0)
Metamyelocytes Relative: 0 %
Monocytes Absolute: 1.9 10*3/uL — ABNORMAL HIGH (ref 0.1–1.0)
Monocytes Relative: 5 % (ref 3–12)
Myelocytes: 0 %
Neutro Abs: 31.8 10*3/uL — ABNORMAL HIGH (ref 1.7–7.7)
Neutrophils Relative %: 85 % — ABNORMAL HIGH (ref 43–77)
Platelets: ADEQUATE 10*3/uL (ref 150–400)
Promyelocytes Absolute: 0 %
RBC: 2.36 MIL/uL — ABNORMAL LOW (ref 3.87–5.11)
RDW: 15.1 % (ref 11.5–15.5)
WBC Morphology: INCREASED
WBC: 37.4 10*3/uL — ABNORMAL HIGH (ref 4.0–10.5)
nRBC: 0 /100 WBC

## 2014-07-02 LAB — COMPREHENSIVE METABOLIC PANEL
ALT: 38 U/L — ABNORMAL HIGH (ref 0–35)
AST: 36 U/L (ref 0–37)
Albumin: 1.9 g/dL — ABNORMAL LOW (ref 3.5–5.2)
Alkaline Phosphatase: 335 U/L — ABNORMAL HIGH (ref 39–117)
Anion gap: 10 (ref 5–15)
BUN: 13 mg/dL (ref 6–23)
CO2: 21 mEq/L (ref 19–32)
Calcium: 9.2 mg/dL (ref 8.4–10.5)
Chloride: 104 mEq/L (ref 96–112)
Creatinine, Ser: 1.37 mg/dL — ABNORMAL HIGH (ref 0.50–1.10)
GFR calc Af Amer: 50 mL/min — ABNORMAL LOW (ref >90)
GFR calc non Af Amer: 43 mL/min — ABNORMAL LOW (ref >90)
Glucose, Bld: 99 mg/dL (ref 70–99)
Potassium: 3.7 mEq/L (ref 3.7–5.3)
Sodium: 135 mEq/L — ABNORMAL LOW (ref 137–147)
Total Bilirubin: 2.1 mg/dL — ABNORMAL HIGH (ref 0.3–1.2)
Total Protein: 6.7 g/dL (ref 6.0–8.3)

## 2014-07-02 LAB — CBC
HCT: 19.8 % — ABNORMAL LOW (ref 36.0–46.0)
Hemoglobin: 7.1 g/dL — ABNORMAL LOW (ref 12.0–15.0)
MCH: 30.5 pg (ref 26.0–34.0)
MCHC: 35.9 g/dL (ref 30.0–36.0)
MCV: 85 fL (ref 78.0–100.0)
Platelets: 211 10*3/uL (ref 150–400)
RBC: 2.33 MIL/uL — ABNORMAL LOW (ref 3.87–5.11)
RDW: 15.1 % (ref 11.5–15.5)
WBC: 37.4 10*3/uL — ABNORMAL HIGH (ref 4.0–10.5)

## 2014-07-02 LAB — GLUCOSE, CAPILLARY
Glucose-Capillary: 83 mg/dL (ref 70–99)
Glucose-Capillary: 140 mg/dL — ABNORMAL HIGH (ref 70–99)
Glucose-Capillary: 128 mg/dL — ABNORMAL HIGH (ref 70–99)
Glucose-Capillary: 121 mg/dL — ABNORMAL HIGH (ref 70–99)

## 2014-07-02 MED ORDER — WHITE PETROLATUM GEL
Status: AC
  Administered 2014-07-02: 0.2
  Filled 2014-07-02: qty 5

## 2014-07-02 MED ORDER — INSULIN GLARGINE 100 UNIT/ML ~~LOC~~ SOLN
30.0000 [IU] | Freq: Every day | SUBCUTANEOUS | Status: DC
  Administered 2014-07-02 – 2014-07-05 (×4): 30 [IU] via SUBCUTANEOUS
  Filled 2014-07-02 (×5): qty 0.3

## 2014-07-02 MED ORDER — ENOXAPARIN SODIUM 40 MG/0.4ML ~~LOC~~ SOLN
40.0000 mg | SUBCUTANEOUS | Status: DC
  Administered 2014-07-02 – 2014-07-05 (×4): 40 mg via SUBCUTANEOUS
  Filled 2014-07-02 (×5): qty 0.4

## 2014-07-02 MED ORDER — INSULIN ASPART 100 UNIT/ML ~~LOC~~ SOLN
3.0000 [IU] | Freq: Three times a day (TID) | SUBCUTANEOUS | Status: DC
  Administered 2014-07-02 – 2014-07-06 (×8): 3 [IU] via SUBCUTANEOUS

## 2014-07-02 NOTE — Progress Notes (Signed)
Patient ID: Kayla [name], female   DOB: Nov 19, 1960, 53 y.o.   MRN: [id number]        Attending progress note    Date of Admission:  06/27/2014     Principal Problem:   Cellulitis and abscess Active Problems:   HYPOTHYROIDISM   DM type 2, uncontrolled, with severe neuropathy   Fibromyalgia syndrome   Essential hypertension, benign   Dysuria   Unspecified constipation   Lymphadenopathy   Sepsis   Hyperglycemia   Unspecified protein-calorie malnutrition   Liver function test abnormality   Acute renal failure superimposed on stage 3 chronic kidney disease   Hyponatremia   Leukocytosis   .  ceFAZolin (ANCEF) IV  1 g Intravenous 3 times per day  . DULoxetine  60 mg Oral Daily  . enoxaparin (LOVENOX) injection  40 mg Subcutaneous Q24H  . gabapentin  300 mg Oral QID  . insulin aspart  0-20 Units Subcutaneous TID WC  . insulin glargine  40 Units Subcutaneous QHS  . levothyroxine  100 mcg Oral QAC breakfast  . pantoprazole  40 mg Oral Daily  . polyethylene glycol  17 g Oral Daily  . senna-docusate  1 tablet Oral QHS  . sodium chloride  3 mL Intravenous Q12H  . zolpidem  5 mg Oral QHS    Ms. Albee a large, loculated thigh abscess drained yesterday. Gram stain showed no organisms and her cultures are pending. She remains afebrile and her blood cell count is down slightly. We will continue cefazolin pending culture results.   Michel Bickers, MD [facility] [phone] pager   [phone] cell 07/02/2014, 2:17 PM

## 2014-07-02 NOTE — Progress Notes (Addendum)
Subjective:  Had I&D at OR yesterday of the left thigh abscess. Doing well currently. Has some pain on the thigh. Denies any sob, cp, cough, n/v, fever, chills, headache, numbness, weakness, tingling. Has new oxygen need as sat dropped at PACU. Dropped again when RN tried to wean her off today. Back at [place] oxygen. No complaints. Asymptomatic when o2 dropped.  Objective: Vital signs in last 24 hours: Filed Vitals:   07/02/14 1013 07/02/14 1020 07/02/14 1302 07/02/14 1419  BP:    128/64  Pulse:    86  Temp:    98.5 F (36.9 C)  TempSrc:    Oral  Resp:    18  Height:      Weight:      SpO2: 57% 98% 90% 100%   Weight change: 4 lb 3 oz (1.9 kg)  Intake/Output Summary (Last 24 hours) at 07/02/14 1551 Last data filed at 07/02/14 0918  Gross per 24 hour  Intake 3971.67 ml  Output    900 ml  Net 3071.67 ml   General: NAD HEENT: [place]/at, anicteric sclera  Cardiac: RRR, no rubs, murmurs or gallops  Pulm: clear to auscultation bilaterally, no wheezes, rales, or rhonchi  Abd: soft, tender to palpation in LLQ, nondistended, BS present  Ext: warm and well perfused, no pedal edema, left thigh abscess site covered with dressing. Some TTP Neuro: alert and oriented X3, cranial nerves II-XII grossly intact. 4/5 strength to extension on right arm.   Lab Results: Basic Metabolic Panel:  Recent Labs Lab 07/01/14 0944 07/02/14 0615  NA 135* 135*  K 4.8 3.7  CL 104 104  CO2 19 21  GLUCOSE 92 99  BUN 16 13  CREATININE 1.34* 1.37*  CALCIUM 9.2 9.2   Liver Function Tests:  Recent Labs Lab 07/01/14 0944 07/02/14 0615  AST 58* 36  ALT 51* 38*  ALKPHOS 337* 335*  BILITOT 2.6* 2.1*  PROT 7.1 6.7  ALBUMIN 1.9* 1.9*   CBC:  Recent Labs Lab 07/01/14 0944 07/02/14 0615  WBC 43.9* 37.4*  NEUTROABS 37.4* 31.8*  HGB 8.4* 7.4*  HCT 23.4* 20.5*  MCV 86.7 86.9  PLT 228 PLATELET CLUMPS NOTED ON SMEAR, COUNT APPEARS ADEQUATE   Cardiac Enzymes:  Recent Labs Lab 06/27/14 1725    TROPONINI <0.30   CBG:  Recent Labs Lab 07/01/14 1205 07/01/14 1427 07/01/14 1607 07/01/14 2302 07/02/14 0747 07/02/14 1157  GLUCAP 126* 113* 111* 126* 83 140*   Hemoglobin A1C:  Recent Labs Lab 06/27/14 1725  HGBA1C 9.7*   Urinalysis:  Recent Labs Lab 06/27/14 2234  COLORURINE AMBER*  LABSPEC 1.031*  PHURINE 5.5  GLUCOSEU >1000*  HGBUR SMALL*  BILIRUBINUR SMALL*  KETONESUR 15*  PROTEINUR 100*  UROBILINOGEN 1.0  NITRITE POSITIVE*  LEUKOCYTESUR NEGATIVE    Micro Results: Recent Results (from the past 240 hour(s))  CULTURE, BLOOD (ROUTINE X 2)     Status: None   Collection Time    06/27/14  4:55 PM      Result Value Ref Range Status   Specimen Description BLOOD RIGHT ARM   Final   Special Requests BOTTLES DRAWN AEROBIC AND ANAEROBIC 5CC   Final   Culture  Setup Time     Final   Value: 06/27/2014 22:09     Performed at [facility]   Culture     Final   Value:        BLOOD CULTURE RECEIVED NO GROWTH TO DATE CULTURE WILL BE HELD FOR 5 DAYS  BEFORE ISSUING A FINAL NEGATIVE REPORT     Performed at [facility]   Report Status PENDING   Incomplete  CULTURE, BLOOD (ROUTINE X 2)     Status: None   Collection Time    06/27/14  9:06 PM      Result Value Ref Range Status   Specimen Description BLOOD LEFT WRIST   Final   Special Requests BOTTLES DRAWN AEROBIC ONLY 5CC   Final   Culture  Setup Time     Final   Value: 06/28/2014 00:26     Performed at [facility]   Culture     Final   Value:        BLOOD CULTURE RECEIVED NO GROWTH TO DATE CULTURE WILL BE HELD FOR 5 DAYS BEFORE ISSUING A FINAL NEGATIVE REPORT     Performed at [facility]   Report Status PENDING   Incomplete  URINE CULTURE     Status: None   Collection Time    06/27/14 10:34 PM      Result Value Ref Range Status   Specimen Description URINE, CLEAN CATCH   Final   Special Requests NONE   Final   Culture  Setup Time     Final   Value: 06/27/2014 23:11      Performed at [facility] Count     Final   Value: 2,000 COLONIES/ML     Performed at [facility]   Culture     Final   Value: INSIGNIFICANT GROWTH     Performed at [facility]   Report Status 06/28/2014 FINAL   Final  ANAEROBIC CULTURE     Status: None   Collection Time    07/01/14  3:09 PM      Result Value Ref Range Status   Specimen Description ABSCESS THIGH LEFT   Final   Special Requests PT ON ANCEF   Final   Gram Stain     Final   Value: FEW WBC PRESENT,BOTH PMN AND MONONUCLEAR     NO SQUAMOUS EPITHELIAL CELLS SEEN     NO ORGANISMS SEEN     Performed at [facility]   Culture     Final   Value: NO ANAEROBES ISOLATED; CULTURE IN PROGRESS FOR 5 DAYS     Performed at [facility]   Report Status PENDING   Incomplete  CULTURE, ROUTINE-ABSCESS     Status: None   Collection Time    07/01/14  3:09 PM      Result Value Ref Range Status   Specimen Description ABSCESS THIGH LEFT   Final   Special Requests PT ON ANCEF   Final   Gram Stain     Final   Value: FEW WBC PRESENT,BOTH PMN AND MONONUCLEAR     NO SQUAMOUS EPITHELIAL CELLS SEEN     NO ORGANISMS SEEN     Performed at [facility]   Culture     Final   Value: NO GROWTH     Performed at [facility]   Report Status PENDING   Incomplete   Studies/Results: Ct Pelvis W Contrast  06/30/2014   CLINICAL DATA:  Pelvic cellulitis.  Evaluate for abscess.  EXAM: CT PELVIS WITH CONTRAST  TECHNIQUE: Multidetector CT imaging of the pelvis was performed using the standard protocol following the bolus administration of intravenous contrast.  CONTRAST:  45mL OMNIPAQUE IOHEXOL 300 MG/ML  SOLN  COMPARISON:  06/27/2014  FINDINGS: Persistent edema is seen in subcutaneous tissues of the left labia majora and the proximal left thigh and medially and anteriorly. The shows minimal or no significant change. No abscess visualized. Mild lymphadenopathy is seen in the left inguinal  region and bilateral external iliac chains, left side greater than right, which is unchanged and likely reactive in etiology. No other pelvic masses identified.  Uterus and adnexal regions are unremarkable. No intrapelvic inflammatory process or abnormal fluid collections identified. No suspicious bone lesions identified.  IMPRESSION: No significant change in subcutaneous edema involving the proximal left thigh and left labia majora. No soft tissue abscess identified.  Stable mild left inguinal and bilateral external iliac lymphadenopathy, likely reactive in etiology.   Electronically Signed   By: Earle Gell M.D.   On: 06/30/2014 16:36   Medications: I have reviewed the patient's current medications. Scheduled Meds: .  ceFAZolin (ANCEF) IV  1 g Intravenous 3 times per day  . DULoxetine  60 mg Oral Daily  . enoxaparin (LOVENOX) injection  40 mg Subcutaneous Q24H  . gabapentin  300 mg Oral QID  . insulin aspart  0-20 Units Subcutaneous TID WC  . insulin aspart  3 Units Subcutaneous TID WC  . insulin glargine  30 Units Subcutaneous QHS  . levothyroxine  100 mcg Oral QAC breakfast  . pantoprazole  40 mg Oral Daily  . polyethylene glycol  17 g Oral Daily  . senna-docusate  1 tablet Oral QHS  . sodium chloride  3 mL Intravenous Q12H  . zolpidem  5 mg Oral QHS   Continuous Infusions: . sodium chloride 100 mL/hr at 07/02/14 0447   PRN Meds:.acetaminophen, diphenhydrAMINE, HYDROcodone-acetaminophen, HYDROmorphone (DILAUDID) injection, naphazoline-pheniramine, ondansetron, traMADol Assessment/Plan: Principal Problem:   Cellulitis and abscess Active Problems:   HYPOTHYROIDISM   DM type 2, uncontrolled, with severe neuropathy   Fibromyalgia syndrome   Essential hypertension, benign   Dysuria   Unspecified constipation   Lymphadenopathy   Sepsis   Hyperglycemia   Unspecified protein-calorie malnutrition   Liver function test abnormality   Acute renal failure superimposed on stage 3 chronic  kidney disease   Hyponatremia   Leukocytosis  53 yo female with DM II, CKD, HTN here with left thigh abscess.  #Sepsis secondary to abscess of the medial proximal left thigh no abscess seen on CT pelvis initially but surgery was able to drain the abscess yesterday with I&D at OR. WBC was going up to 43.9 but now trending down. Remains afebrile. -previously on vanc/zosyn>>clinda>>now cefazolin. If she continues to improve, will do keflex or something else based on wound culture upon discharge.  -prn Norco 5-325 mg 2 tablets q6 prn pain - BCX NGTD. Wound cx NGTD.  - continue to monitor. If fever, will get CXR to r/o pneumonia. Continue ISS and ambulate to avoid atelectasis and PNA.  # Leukocytosis - normal WBC on may 2015. Admitted with WBC in 30.1. Was upto 43.9 but improving after abscess drainage.  - has mature neutrophils with toxic granulations likely 2/2 to infection. But she also has lot of early cells such as myelocytes and promyelocytes, targe cells and rare nucleated RBCs - will see if WBC improves with above. If not, may be developing early CML. Will pursue CML w/up later including bcr-ABL if above fails.  #Acute on chronic CKD: FENa 0.5% - prerenal. Increased to 2.35 but Crt trending down to 1.37  Baseline around 1.6. - continue to monitor  #DM type 2, uncontrolled, with severe neuropathy with hyperglycemia:  Hgb A1c 9.7.came in with bg 447. BG under 140's now. - lantus 40 at home. Lowered to 30 Units here with meal time coverage. -Will need outpatient follow up with [facility]   #Hyperbilirubinemia, elevated LFTs: T bili and AST/ALT both trending down -US Abdomen Normal gallbladder. No bile duct dilation. Round, hypoechoic lesion along pancreas tail, likely bowel gas. -Hep panel pending. Hemochromatosis negative.   #Essential hypertension, benign  -Currently normotensive. -continue to hold home olmesartan as BP is low in 100's.  -monitor BP   #UTI: Insignificant growth on  UCx, no further need for abx.  #Chest pain: likely MSK, resolved -Protonix 40 meq qd  #Unspecified constipation  -Senna-S qhs  -Miralax daily  #Hypothyroidism -Continue Synthyroid 100 mcg qd   #Fibromyalgia  -Continue Cymbalta 60 mg daily, Neurontin 300 mg qid  #Headache  -prn Tylenol 325 daily   #Nausea  -prn Zofran q6 hours   #Insomina  -continued home Ambien 5 mg qhs. Only uses as needed.   FEN: Continues to have hyponatremia but improving. -H/H carb modified diet   DVT ppx  -Lovenox, scds  Dispo: Disposition is deferred at this time, awaiting improvement of current medical problems.  Anticipated discharge in approximately 2 day(s).   The patient does have a current PCP Luan Moore, MD) and does need an [facility] hospital follow-up appointment after discharge.  The patient does not have transportation limitations that hinder transportation to clinic appointments.  .Services Needed at time of discharge: Y = Yes, Blank = No PT:   OT:   RN:   Equipment:   Other:     LOS: 5 days   Dellia Nims, MD 07/02/2014, 3:51 PM

## 2014-07-02 NOTE — Progress Notes (Signed)
[facility] Progress Note  1 Day Post-Op  Subjective: Pt doing well, pain significantly better, some soreness at incision sites.  No fevers.  WBC trending down, but still 37,000.  Ambulating some.  Tolerating solid diet.  Tolerated dressing change well.    Objective: Vital signs in last 24 hours: Temp:  [97.5 F (36.4 C)-98.5 F (36.9 C)] 98.4 F (36.9 C) (10/03 0531) Pulse Rate:  [75-89] 87 (10/02 2330) Resp:  [14-22] 18 (10/02 2330) BP: (115-142)/(58-80) 117/60 mmHg (10/03 0531) SpO2:  [57 %-100 %] 98 % (10/03 1020) Weight:  [194 lb 0.1 oz (88 kg)] 194 lb 0.1 oz (88 kg) (10/03 0449) Last BM Date: 06/29/14  Intake/Output from previous day: 10/02 0701 - 10/03 0700 In: 3971.7 [I.V.:3821.7; IV Piggyback:150] Out: 400 [Urine:400] Intake/Output this shift: Total I/O In: -  Out: 500 [Urine:500]  PE: Gen:  Alert, NAD, pleasant Skin: warm and dry, 6cm incision at superior aspect of left thigh wound which is 3cm deep. Inferior groin wound is 8cm in length and more superficial, most proximal part is 1cm deep   Lab Results:   Recent Labs  07/01/14 0944 07/02/14 0615  WBC 43.9* 37.4*  HGB 8.4* 7.4*  HCT 23.4* 20.5*  PLT 228 PLATELET CLUMPS NOTED ON SMEAR, COUNT APPEARS ADEQUATE   BMET  Recent Labs  07/01/14 0944 07/02/14 0615  NA 135* 135*  K 4.8 3.7  CL 104 104  CO2 19 21  GLUCOSE 92 99  BUN 16 13  CREATININE 1.34* 1.37*  CALCIUM 9.2 9.2   PT/INR No results found for this basename: LABPROT, INR,  in the last 72 hours CMP     Component Value Date/Time   NA 135* 07/02/2014 0615   K 3.7 07/02/2014 0615   CL 104 07/02/2014 0615   CO2 21 07/02/2014 0615   GLUCOSE 99 07/02/2014 0615   BUN 13 07/02/2014 0615   CREATININE 1.37* 07/02/2014 0615   CREATININE 1.13* 08/24/2012 1604   CALCIUM 9.2 07/02/2014 0615   PROT 6.7 07/02/2014 0615   ALBUMIN 1.9* 07/02/2014 0615   AST 36 07/02/2014 0615   ALT 38* 07/02/2014 0615   ALKPHOS 335* 07/02/2014 0615   BILITOT  2.1* 07/02/2014 0615   GFRNONAA 43* 07/02/2014 0615   GFRNONAA 57* 08/24/2012 1604   GFRAA 50* 07/02/2014 0615   GFRAA 65 08/24/2012 1604   Lipase     Component Value Date/Time   LIPASE 67* 08/17/2012 2108       Studies/Results: Ct Pelvis W Contrast  06/30/2014   CLINICAL DATA:  Pelvic cellulitis.  Evaluate for abscess.  EXAM: CT PELVIS WITH CONTRAST  TECHNIQUE: Multidetector CT imaging of the pelvis was performed using the standard protocol following the bolus administration of intravenous contrast.  CONTRAST:  84mL OMNIPAQUE IOHEXOL 300 MG/ML  SOLN  COMPARISON:  06/27/2014  FINDINGS: Persistent edema is seen in subcutaneous tissues of the left labia majora and the proximal left thigh and medially and anteriorly. The shows minimal or no significant change. No abscess visualized. Mild lymphadenopathy is seen in the left inguinal region and bilateral external iliac chains, left side greater than right, which is unchanged and likely reactive in etiology. No other pelvic masses identified.  Uterus and adnexal regions are unremarkable. No intrapelvic inflammatory process or abnormal fluid collections identified. No suspicious bone lesions identified.  IMPRESSION: No significant change in subcutaneous edema involving the proximal left thigh and left labia majora. No soft tissue abscess identified.  Stable mild left inguinal and  bilateral external iliac lymphadenopathy, likely reactive in etiology.   Electronically Signed   By: Earle Gell M.D.   On: 06/30/2014 16:36    Anti-infectives: Anti-infectives   Start     Dose/Rate Route Frequency Ordered Stop   07/01/14 1400  ceFAZolin (ANCEF) IVPB 1 g/50 mL premix     1 g 100 mL/hr over 30 Minutes Intravenous 3 times per day 07/01/14 1242     06/29/14 1200  clindamycin (CLEOCIN) capsule 300 mg  Status:  Discontinued     300 mg Oral 4 times per day 06/29/14 1040 07/01/14 1242   06/28/14 0600  vancomycin (VANCOCIN) IVPB 750 mg/150 ml premix  Status:   Discontinued     750 mg 150 mL/hr over 60 Minutes Intravenous Every 12 hours 06/27/14 1850 06/29/14 1040   06/27/14 2300  piperacillin-tazobactam (ZOSYN) IVPB 3.375 g  Status:  Discontinued     3.375 g 12.5 mL/hr over 240 Minutes Intravenous 3 times per day 06/27/14 1716 06/29/14 1040   06/27/14 1800  vancomycin (VANCOCIN) 1,500 mg in sodium chloride 0.9 % 500 mL IVPB     1,500 mg 250 mL/hr over 120 Minutes Intravenous  Once 06/27/14 1716 06/27/14 2012   06/27/14 1730  piperacillin-tazobactam (ZOSYN) IVPB 3.375 g     3.375 g 100 mL/hr over 30 Minutes Intravenous  Once 06/27/14 1716 06/27/14 1842       Assessment/Plan Left thigh abscess/cellulitis/mass  Leukocytosis - 37.4 Type 2 diabetes insulin dependent  Plan: 1.  Would keep the patient until her WBC is significantly down closer to normal, would continue IV antibiotics 2.  Dressing changes TID WD, will need Daily dressing changes to wound upon discharge home (may need HH) 3.  Encourage ambulating 4.  Will follow, home in a few days    LOS: 5 days    DORT, Aysha Livecchi 07/02/2014, 11:06 AM Pager: [phone]

## 2014-07-03 ENCOUNTER — Inpatient Hospital Stay (HOSPITAL_COMMUNITY): Payer: MEDICAID

## 2014-07-03 DIAGNOSIS — R17 Unspecified jaundice: Secondary | ICD-10-CM

## 2014-07-03 DIAGNOSIS — Z794 Long term (current) use of insulin: Secondary | ICD-10-CM

## 2014-07-03 DIAGNOSIS — R0902 Hypoxemia: Secondary | ICD-10-CM

## 2014-07-03 DIAGNOSIS — E038 Other specified hypothyroidism: Secondary | ICD-10-CM

## 2014-07-03 LAB — CULTURE, BLOOD (ROUTINE X 2): Culture: NO GROWTH

## 2014-07-03 LAB — CBC
HCT: 19.7 % — ABNORMAL LOW (ref 36.0–46.0)
Hemoglobin: 6.9 g/dL — CL (ref 12.0–15.0)
MCH: 30.9 pg (ref 26.0–34.0)
MCHC: 35 g/dL (ref 30.0–36.0)
MCV: 88.3 fL (ref 78.0–100.0)
Platelets: 215 10*3/uL (ref 150–400)
RBC: 2.23 MIL/uL — ABNORMAL LOW (ref 3.87–5.11)
RDW: 15.5 % (ref 11.5–15.5)
WBC: 36.2 10*3/uL — ABNORMAL HIGH (ref 4.0–10.5)

## 2014-07-03 LAB — GLUCOSE, CAPILLARY
Glucose-Capillary: 105 mg/dL — ABNORMAL HIGH (ref 70–99)
Glucose-Capillary: 83 mg/dL (ref 70–99)
Glucose-Capillary: 134 mg/dL — ABNORMAL HIGH (ref 70–99)
Glucose-Capillary: 149 mg/dL — ABNORMAL HIGH (ref 70–99)

## 2014-07-03 MED ORDER — INSULIN ASPART 100 UNIT/ML ~~LOC~~ SOLN
0.0000 [IU] | Freq: Three times a day (TID) | SUBCUTANEOUS | Status: DC
  Administered 2014-07-03 – 2014-07-04 (×2): 2 [IU] via SUBCUTANEOUS
  Administered 2014-07-04: 3 [IU] via SUBCUTANEOUS
  Administered 2014-07-05: 2 [IU] via SUBCUTANEOUS
  Administered 2014-07-06: 3 [IU] via SUBCUTANEOUS
  Administered 2014-07-06: 2 [IU] via SUBCUTANEOUS

## 2014-07-03 NOTE — Progress Notes (Signed)
CRITICAL VALUE ALERT  Critical value received:  Hgb 6.9  Date of notification:  07/03/14  Time of notification:  0510  Critical value read back:Yes.    Nurse who received alert:  J. Sheryn Bison, RN  MD notified (1st page):    Time of first page:  [phone]  Responding MD:  Posey Pronto  Time MD responded:  [phone]

## 2014-07-03 NOTE — Progress Notes (Signed)
2 Days Post-Op  Subjective: She is comfortable today Tolerating dressing changes  Objective: Vital signs in last 24 hours: Temp:  [97.9 F (36.6 C)-98.5 F (36.9 C)] 98.3 F (36.8 C) (10/04 0549) Pulse Rate:  [79-92] 92 (10/04 0549) Resp:  [16-18] 18 (10/04 0549) BP: (125-128)/(59-66) 126/59 mmHg (10/04 0549) SpO2:  [57 %-100 %] 95 % (10/04 0549) Last BM Date: 06/29/14  Intake/Output from previous day: 10/03 0701 - 10/04 0700 In: -  Out: 1050 [Urine:1050] Intake/Output this shift:    Thigh wound stable, minimal surrounding induration No active bleeding  Lab Results:   Recent Labs  07/02/14 1655 07/03/14 0409  WBC 37.4* 36.2*  HGB 7.1* 6.9*  HCT 19.8* 19.7*  PLT 211 215   BMET  Recent Labs  07/01/14 0944 07/02/14 0615  NA 135* 135*  K 4.8 3.7  CL 104 104  CO2 19 21  GLUCOSE 92 99  BUN 16 13  CREATININE 1.34* 1.37*  CALCIUM 9.2 9.2   PT/INR No results found for this basename: LABPROT, INR,  in the last 72 hours ABG No results found for this basename: PHART, PCO2, PO2, HCO3,  in the last 72 hours  Studies/Results: No results found.  Anti-infectives: Anti-infectives   Start     Dose/Rate Route Frequency Ordered Stop   07/01/14 1400  ceFAZolin (ANCEF) IVPB 1 g/50 mL premix     1 g 100 mL/hr over 30 Minutes Intravenous 3 times per day 07/01/14 1242     06/29/14 1200  clindamycin (CLEOCIN) capsule 300 mg  Status:  Discontinued     300 mg Oral 4 times per day 06/29/14 1040 07/01/14 1242   06/28/14 0600  vancomycin (VANCOCIN) IVPB 750 mg/150 ml premix  Status:  Discontinued     750 mg 150 mL/hr over 60 Minutes Intravenous Every 12 hours 06/27/14 1850 06/29/14 1040   06/27/14 2300  piperacillin-tazobactam (ZOSYN) IVPB 3.375 g  Status:  Discontinued     3.375 g 12.5 mL/hr over 240 Minutes Intravenous 3 times per day 06/27/14 1716 06/29/14 1040   06/27/14 1800  vancomycin (VANCOCIN) 1,500 mg in sodium chloride 0.9 % 500 mL IVPB     1,500 mg 250 mL/hr  over 120 Minutes Intravenous  Once 06/27/14 1716 06/27/14 2012   06/27/14 1730  piperacillin-tazobactam (ZOSYN) IVPB 3.375 g     3.375 g 100 mL/hr over 30 Minutes Intravenous  Once 06/27/14 1716 06/27/14 1842      Assessment/Plan: s/p Procedure(s): INCISION AND DRAINAGE ABSCESS OF LEFT THIGH (Left)  Continue current wound care and antibiotics Transfuse per medical team  LOS: 6 days    Jeree Delcid A 07/03/2014

## 2014-07-03 NOTE — Progress Notes (Signed)
Subjective: Patient reports feeling better today, still getting SOB when oxygen is off, but otherwise asymptomatic.  Reports she has been using IS. Objective: Vital signs in last 24 hours: Filed Vitals:   07/02/14 1419 07/02/14 2101 07/03/14 0549 07/03/14 1407  BP: 128/64 125/66 126/59 132/55  Pulse: 86 79 92 85  Temp: 98.5 F (36.9 C) 97.9 F (36.6 C) 98.3 F (36.8 C) 98 F (36.7 C)  TempSrc: Oral Oral Oral Oral  Resp: 18 16 18 16   Height:      Weight:      SpO2: 100% 100% 95% 100%   Weight change:   Intake/Output Summary (Last 24 hours) at 07/03/14 1412 Last data filed at 07/03/14 0925  Gross per 24 hour  Intake      0 ml  Output    550 ml  Net   -550 ml   General: resting in bed Cardiac: RRR, no rubs, murmurs or gallops Pulm: left basilar rales Abd: soft, nontender, nondistended, BS present Ext: bandage over left groin c/d/i   Lab Results: Basic Metabolic Panel:  Recent Labs Lab 07/01/14 0944 07/02/14 0615  NA 135* 135*  K 4.8 3.7  CL 104 104  CO2 19 21  GLUCOSE 92 99  BUN 16 13  CREATININE 1.34* 1.37*  CALCIUM 9.2 9.2   Liver Function Tests:  Recent Labs Lab 07/01/14 0944 07/02/14 0615  AST 58* 36  ALT 51* 38*  ALKPHOS 337* 335*  BILITOT 2.6* 2.1*  PROT 7.1 6.7  ALBUMIN 1.9* 1.9*   CBC:  Recent Labs Lab 07/01/14 0944 07/02/14 0615 07/02/14 1655 07/03/14 0409  WBC 43.9* 37.4* 37.4* 36.2*  NEUTROABS 37.4* 31.8*  --   --   HGB 8.4* 7.4* 7.1* 6.9*  HCT 23.4* 20.5* 19.8* 19.7*  MCV 86.7 86.9 85.0 88.3  PLT 228 PLATELET CLUMPS NOTED ON SMEAR, COUNT APPEARS ADEQUATE 211 215   Cardiac Enzymes:  Recent Labs Lab 06/27/14 1725  TROPONINI <0.30   CBG:  Recent Labs Lab 07/02/14 0747 07/02/14 1157 07/02/14 1700 07/02/14 2104 07/03/14 0744 07/03/14 1210  GLUCAP 83 140* 128* 121* 105* 83   Hemoglobin A1C:  Recent Labs Lab 06/27/14 1725  HGBA1C 9.7*   Urinalysis:  Recent Labs Lab 06/27/14 2234  COLORURINE AMBER*    LABSPEC 1.031*  PHURINE 5.5  GLUCOSEU >1000*  HGBUR SMALL*  BILIRUBINUR SMALL*  KETONESUR 15*  PROTEINUR 100*  UROBILINOGEN 1.0  NITRITE POSITIVE*  LEUKOCYTESUR NEGATIVE    Micro Results: Recent Results (from the past 240 hour(s))  CULTURE, BLOOD (ROUTINE X 2)     Status: None   Collection Time    06/27/14  4:55 PM      Result Value Ref Range Status   Specimen Description BLOOD RIGHT ARM   Final   Special Requests BOTTLES DRAWN AEROBIC AND ANAEROBIC 5CC   Final   Culture  Setup Time     Final   Value: 06/27/2014 22:09     Performed at [facility]     Final   Value: NO GROWTH 5 DAYS     Performed at [facility]   Report Status 07/03/2014 FINAL   Final  CULTURE, BLOOD (ROUTINE X 2)     Status: None   Collection Time    06/27/14  9:06 PM      Result Value Ref Range Status   Specimen Description BLOOD LEFT WRIST   Final   Special Requests BOTTLES DRAWN AEROBIC ONLY 5CC  Final   Culture  Setup Time     Final   Value: 06/28/2014 00:26     Performed at [facility]   Culture     Final   Value:        BLOOD CULTURE RECEIVED NO GROWTH TO DATE CULTURE WILL BE HELD FOR 5 DAYS BEFORE ISSUING A FINAL NEGATIVE REPORT     Performed at [facility]   Report Status PENDING   Incomplete  URINE CULTURE     Status: None   Collection Time    06/27/14 10:34 PM      Result Value Ref Range Status   Specimen Description URINE, CLEAN CATCH   Final   Special Requests NONE   Final   Culture  Setup Time     Final   Value: 06/27/2014 23:11     Performed at [place]     Final   Value: 2,000 COLONIES/ML     Performed at [facility]   Culture     Final   Value: INSIGNIFICANT GROWTH     Performed at [facility]   Report Status 06/28/2014 FINAL   Final  ANAEROBIC CULTURE     Status: None   Collection Time    07/01/14  3:09 PM      Result Value Ref Range Status   Specimen Description ABSCESS THIGH LEFT    Final   Special Requests PT ON ANCEF   Final   Gram Stain     Final   Value: FEW WBC PRESENT,BOTH PMN AND MONONUCLEAR     NO SQUAMOUS EPITHELIAL CELLS SEEN     NO ORGANISMS SEEN     Performed at [facility]   Culture     Final   Value: NO ANAEROBES ISOLATED; CULTURE IN PROGRESS FOR 5 DAYS     Performed at [facility]   Report Status PENDING   Incomplete  CULTURE, ROUTINE-ABSCESS     Status: None   Collection Time    07/01/14  3:09 PM      Result Value Ref Range Status   Specimen Description ABSCESS THIGH LEFT   Final   Special Requests PT ON ANCEF   Final   Gram Stain     Final   Value: FEW WBC PRESENT,BOTH PMN AND MONONUCLEAR     NO SQUAMOUS EPITHELIAL CELLS SEEN     NO ORGANISMS SEEN     Performed at [facility]   Culture     Final   Value: NO GROWTH 1 DAY     Performed at [facility]   Report Status PENDING   Incomplete   Studies/Results: No results found. Medications: I have reviewed the patient's current medications. Scheduled Meds: .  ceFAZolin (ANCEF) IV  1 g Intravenous 3 times per day  . DULoxetine  60 mg Oral Daily  . enoxaparin (LOVENOX) injection  40 mg Subcutaneous Q24H  . gabapentin  300 mg Oral QID  . insulin aspart  0-20 Units Subcutaneous TID WC  . insulin aspart  3 Units Subcutaneous TID WC  . insulin glargine  30 Units Subcutaneous QHS  . levothyroxine  100 mcg Oral QAC breakfast  . pantoprazole  40 mg Oral Daily  . polyethylene glycol  17 g Oral Daily  . senna-docusate  1 tablet Oral QHS  . sodium chloride  3 mL Intravenous Q12H  . zolpidem  5 mg Oral QHS  Continuous Infusions:   PRN Meds:.acetaminophen, diphenhydrAMINE, HYDROcodone-acetaminophen, HYDROmorphone (DILAUDID) injection, naphazoline-pheniramine, ondansetron, traMADol Assessment/Plan: Principal Problem:   Cellulitis and abscess Active Problems:   HYPOTHYROIDISM   DM type 2, uncontrolled, with severe neuropathy   Fibromyalgia syndrome    Essential hypertension, benign   Dysuria   Unspecified constipation   Lymphadenopathy   Sepsis   Hyperglycemia   Unspecified protein-calorie malnutrition   Liver function test abnormality   Acute renal failure superimposed on stage 3 chronic kidney disease   Hyponatremia   Leukocytosis  53 yo female with DM II, CKD, HTN here with left thigh abscess.  #Left thigh Abscess Leukocytosis resolving slowly after surgery.  Patient asymptomatic.  -Continue Ancef, so far cultures no growth.  #Hypoxia - Patient has still been on supplemental O2 since surgery.  Nursing staff is titration down as indicated. - Obtain CXR to evaluate, ?volume overload as patient has received multiple days of IVF versus post surgical atelectasis.   #Acute on chronic CKD: resolved, now back to baseline ~1.37 -D/C IVF  #DM type 2, uncontrolled, with severe neuropathy with hyperglycemia: Hgb A1c 9.7.came in with bg 447. BG under 140's now. - lantus 40 at home. Which was lowered to 30, unclear if patient needs this high of a dose with sugars very well controlled (no hypoglycemia) - Reduce Lantus to 25 u daily -Continue Novolog 3 units at mealtime -Change SSI to Moderate coverage  #Hyperbilirubinemia, elevated LFTs: T bili and AST/ALT both trending down -US Abdomen Normal gallbladder. No bile duct dilation. Round, hypoechoic lesion along pancreas tail, likely bowel gas. -Hep panel neg. Hemochromatosis negative.  - Bilirubin is trending down to 2.1 which is close to previous readings.  #Essential hypertension, benign  -Currently normotensive.  -continue to hold home olmesartan  -monitor BP   #Hypothyroidism s/p graves ablation -Continue Synthyroid 100 mcg qd   #Fibromyalgia  -Continue Cymbalta 60 mg daily, Neurontin 300 mg qid  #Headache  -prn Tylenol 325 daily   #Nausea  -prn Zofran q6 hours   #Insomina  -continued home Ambien 5 mg qhs. Only uses as needed.   FEN: Continues to have hyponatremia but  improving. -H/H carb modified diet   DVT ppx  -Lovenox, scds  Dispo: Disposition is deferred at this time, awaiting improvement of current medical problems.  Anticipated discharge in approximately 1 day(s).   The patient does have a current PCP Luan Moore, MD) and does need an [facility] hospital follow-up appointment after discharge.  The patient does not have transportation limitations that hinder transportation to clinic appointments.  .Services Needed at time of discharge: Y = Yes, Blank = No PT:   OT:   RN:   Equipment:   Other:     LOS: 6 days   Lucious Groves, DO 07/03/2014, 2:12 PM

## 2014-07-04 ENCOUNTER — Encounter (HOSPITAL_COMMUNITY): Payer: Self-pay | Admitting: General Surgery

## 2014-07-04 DIAGNOSIS — D72829 Elevated white blood cell count, unspecified: Secondary | ICD-10-CM

## 2014-07-04 DIAGNOSIS — R079 Chest pain, unspecified: Secondary | ICD-10-CM

## 2014-07-04 DIAGNOSIS — N289 Disorder of kidney and ureter, unspecified: Secondary | ICD-10-CM

## 2014-07-04 DIAGNOSIS — D649 Anemia, unspecified: Secondary | ICD-10-CM

## 2014-07-04 DIAGNOSIS — R740 Nonspecific elevation of levels of transaminase and lactic acid dehydrogenase [LDH]: Secondary | ICD-10-CM

## 2014-07-04 DIAGNOSIS — I10 Essential (primary) hypertension: Secondary | ICD-10-CM

## 2014-07-04 LAB — BASIC METABOLIC PANEL
Anion gap: 9 (ref 5–15)
BUN: 9 mg/dL (ref 6–23)
CO2: 22 mEq/L (ref 19–32)
Calcium: 8.8 mg/dL (ref 8.4–10.5)
Chloride: 104 mEq/L (ref 96–112)
Creatinine, Ser: 1.18 mg/dL — ABNORMAL HIGH (ref 0.50–1.10)
GFR calc Af Amer: 60 mL/min — ABNORMAL LOW (ref >90)
GFR calc non Af Amer: 52 mL/min — ABNORMAL LOW (ref >90)
Glucose, Bld: 221 mg/dL — ABNORMAL HIGH (ref 70–99)
Potassium: 3.7 mEq/L (ref 3.7–5.3)
Sodium: 135 mEq/L — ABNORMAL LOW (ref 137–147)

## 2014-07-04 LAB — CBC
HCT: 19.3 % — ABNORMAL LOW (ref 36.0–46.0)
Hemoglobin: 6.9 g/dL — CL (ref 12.0–15.0)
MCH: 31.8 pg (ref 26.0–34.0)
MCHC: 35.8 g/dL (ref 30.0–36.0)
MCV: 88.9 fL (ref 78.0–100.0)
Platelets: 213 10*3/uL (ref 150–400)
RBC: 2.17 MIL/uL — ABNORMAL LOW (ref 3.87–5.11)
RDW: 16 % — ABNORMAL HIGH (ref 11.5–15.5)
WBC: 34.7 10*3/uL — ABNORMAL HIGH (ref 4.0–10.5)

## 2014-07-04 LAB — COMPREHENSIVE METABOLIC PANEL
ALT: 14 U/L (ref 0–35)
AST: 30 U/L (ref 0–37)
Albumin: 2.1 g/dL — ABNORMAL LOW (ref 3.5–5.2)
Alkaline Phosphatase: 384 U/L — ABNORMAL HIGH (ref 39–117)
Anion gap: 9 (ref 5–15)
BUN: 8 mg/dL (ref 6–23)
CO2: 25 mEq/L (ref 19–32)
Calcium: 9.5 mg/dL (ref 8.4–10.5)
Chloride: 102 mEq/L (ref 96–112)
Creatinine, Ser: 1.21 mg/dL — ABNORMAL HIGH (ref 0.50–1.10)
GFR calc Af Amer: 59 mL/min — ABNORMAL LOW (ref >90)
GFR calc non Af Amer: 51 mL/min — ABNORMAL LOW (ref >90)
Glucose, Bld: 111 mg/dL — ABNORMAL HIGH (ref 70–99)
Potassium: 3.7 mEq/L (ref 3.7–5.3)
Sodium: 136 mEq/L — ABNORMAL LOW (ref 137–147)
Total Bilirubin: 1.4 mg/dL — ABNORMAL HIGH (ref 0.3–1.2)
Total Protein: 7.1 g/dL (ref 6.0–8.3)

## 2014-07-04 LAB — CULTURE, BLOOD (ROUTINE X 2): Culture: NO GROWTH

## 2014-07-04 LAB — RETICULOCYTES
RBC.: 2.31 MIL/uL — ABNORMAL LOW (ref 3.87–5.11)
Retic Count, Absolute: 184.8 10*3/uL (ref 19.0–186.0)
Retic Ct Pct: 8 % — ABNORMAL HIGH (ref 0.4–3.1)

## 2014-07-04 LAB — GLUCOSE, CAPILLARY
Glucose-Capillary: 171 mg/dL — ABNORMAL HIGH (ref 70–99)
Glucose-Capillary: 115 mg/dL — ABNORMAL HIGH (ref 70–99)
Glucose-Capillary: 134 mg/dL — ABNORMAL HIGH (ref 70–99)
Glucose-Capillary: 140 mg/dL — ABNORMAL HIGH (ref 70–99)

## 2014-07-04 LAB — MITOCHONDRIAL ANTIBODIES: Mitochondrial M2 Ab, IgG: 1.17 — ABNORMAL HIGH (ref <0.91)

## 2014-07-04 LAB — LACTATE DEHYDROGENASE: LDH: 296 U/L — ABNORMAL HIGH (ref 94–250)

## 2014-07-04 MED ORDER — FUROSEMIDE 40 MG PO TABS
40.0000 mg | ORAL_TABLET | Freq: Once | ORAL | Status: AC
  Administered 2014-07-04: 40 mg via ORAL
  Filled 2014-07-04: qty 1

## 2014-07-04 MED ORDER — COLLAGENASE 250 UNIT/GM EX OINT
TOPICAL_OINTMENT | Freq: Three times a day (TID) | CUTANEOUS | Status: DC
  Administered 2014-07-04 – 2014-07-06 (×6): via TOPICAL
  Administered 2014-07-06: 1 via TOPICAL
  Filled 2014-07-04: qty 30

## 2014-07-04 NOTE — Transfer of Care (Signed)
Anesthesia Post Note  Patient: Kayla [name]  Procedure(s) Performed: Procedure(s) (LRB): INCISION AND DRAINAGE ABSCESS OF LEFT THIGH (Left)  Anesthesia type: general  Patient location: PACU  Post pain: Pain level controlled  Post assessment: Patient's Cardiovascular Status Stable  Last Vitals:  Filed Vitals:   07/04/14 0559  BP: 141/65  Pulse: 81  Temp: 36.9 C  Resp: 18    Post vital signs: Reviewed and stable  Level of consciousness: sedated  Complications: No apparent anesthesia complications

## 2014-07-04 NOTE — Anesthesia Postprocedure Evaluation (Signed)
Anesthesia Post Note  Patient: Kayla [name]  Procedure(s) Performed: Procedure(s) (LRB): INCISION AND DRAINAGE ABSCESS OF LEFT THIGH (Left)  Anesthesia type: general  Patient location: PACU  Post pain: Pain level controlled  Post assessment: Patient's Cardiovascular Status Stable  Last Vitals:  Filed Vitals:   07/04/14 0559  BP: 141/65  Pulse: 81  Temp: 36.9 C  Resp: 18    Post vital signs: Reviewed and stable  Level of consciousness: sedated  Complications: No apparent anesthesia complications

## 2014-07-04 NOTE — Progress Notes (Signed)
Subjective:  Doing well. Remains on oxygen. Doing well subjectively.   Denies any sob, cp, cough, n/v, fever, chills, headache, numbness, weakness, tingling.  Objective: Vital signs in last 24 hours: Filed Vitals:   07/03/14 0549 07/03/14 1407 07/03/14 2136 07/04/14 0559  BP: 126/59 132/55 141/67 141/65  Pulse: 92 85 84 81  Temp: 98.3 F (36.8 C) 98 F (36.7 C) 98.4 F (36.9 C) 98.4 F (36.9 C)  TempSrc: Oral Oral Oral Oral  Resp: _0 Height:      Weight:      SpO2: 95% 100% 96% 100%   Weight change:   Intake/Output Summary (Last 24 hours) at 07/04/14 1204 Last data filed at 07/04/14 1000  Gross per 24 hour  Intake    220 ml  Output    300 ml  Net    -80 ml   General: NAD HEENT: Dante/at, anicteric sclera  Cardiac: RRR, no rubs, murmurs or gallops  Pulm: clear to auscultation bilaterally, no wheezes, rales, or rhonchi  Abd: soft, tender to palpation in LLQ, nondistended, BS present  Ext: warm and well perfused, no pedal edema, left thigh abscess site covered with dressing. Some TTP Neuro: alert and oriented X3, cranial nerves II-XII grossly intact. 4/5 strength to extension on right arm.   Lab Results: Basic Metabolic Panel:  Recent Labs Lab 07/02/14 0615 07/04/14 0320  NA 135* 135*  K 3.7 3.7  CL 104 104  CO2 21 22  GLUCOSE 99 221*  BUN 13 9  CREATININE 1.37* 1.18*  CALCIUM 9.2 8.8   Liver Function Tests:  Recent Labs Lab 07/01/14 0944 07/02/14 0615  AST 58* 36  ALT 51* 38*  ALKPHOS 337* 335*  BILITOT 2.6* 2.1*  PROT 7.1 6.7  ALBUMIN 1.9* 1.9*   CBC:  Recent Labs Lab 07/01/14 0944 07/02/14 0615  07/03/14 0409 07/04/14 0320  WBC 43.9* 37.4*  < > 36.2* 34.7*  NEUTROABS 37.4* 31.8*  --   --   --   HGB 8.4* 7.4*  < > 6.9* 6.9*  HCT 23.4* 20.5*  < > 19.7* 19.3*  MCV 86.7 86.9  < > 88.3 88.9  PLT 228 PLATELET CLUMPS NOTED ON SMEAR, COUNT APPEARS ADEQUATE  < > 215 213  < > = values in this interval not displayed. Cardiac  Enzymes:  Recent Labs Lab 06/27/14 1725  TROPONINI <0.30   CBG:  Recent Labs Lab 07/03/14 0744 07/03/14 1210 07/03/14 1704 07/03/14 2135 07/04/14 0744 07/04/14 1154  GLUCAP 105* 83 134* 149* 171* 115*   Hemoglobin A1C:  Recent Labs Lab 06/27/14 1725  HGBA1C 9.7*   Urinalysis:  Recent Labs Lab 06/27/14 2234  COLORURINE AMBER*  LABSPEC 1.031*  PHURINE 5.5  GLUCOSEU >1000*  HGBUR SMALL*  BILIRUBINUR SMALL*  KETONESUR 15*  PROTEINUR 100*  UROBILINOGEN 1.0  NITRITE POSITIVE*  LEUKOCYTESUR NEGATIVE    Micro Results: Recent Results (from the past 240 hour(s))  CULTURE, BLOOD (ROUTINE X 2)     Status: None   Collection Time    06/27/14  4:55 PM      Result Value Ref Range Status   Specimen Description BLOOD RIGHT ARM   Final   Special Requests BOTTLES DRAWN AEROBIC AND ANAEROBIC 5CC   Final   Culture  Setup Time     Final   Value: 06/27/2014 22:09     Performed at [facility]   Culture     Final   Value: NO GROWTH 5  DAYS     Performed at [facility]   Report Status 07/03/2014 FINAL   Final  CULTURE, BLOOD (ROUTINE X 2)     Status: None   Collection Time    06/27/14  9:06 PM      Result Value Ref Range Status   Specimen Description BLOOD LEFT WRIST   Final   Special Requests BOTTLES DRAWN AEROBIC ONLY 5CC   Final   Culture  Setup Time     Final   Value: 06/28/2014 00:26     Performed at [facility]   Culture     Final   Value: NO GROWTH 5 DAYS     Performed at [facility]   Report Status 07/04/2014 FINAL   Final  URINE CULTURE     Status: None   Collection Time    06/27/14 10:34 PM      Result Value Ref Range Status   Specimen Description URINE, CLEAN CATCH   Final   Special Requests NONE   Final   Culture  Setup Time     Final   Value: 06/27/2014 23:11     Performed at [place]     Final   Value: 2,000 COLONIES/ML     Performed at [facility]   Culture     Final    Value: INSIGNIFICANT GROWTH     Performed at [facility]   Report Status 06/28/2014 FINAL   Final  ANAEROBIC CULTURE     Status: None   Collection Time    07/01/14  3:09 PM      Result Value Ref Range Status   Specimen Description ABSCESS THIGH LEFT   Final   Special Requests PT ON ANCEF   Final   Gram Stain     Final   Value: FEW WBC PRESENT,BOTH PMN AND MONONUCLEAR     NO SQUAMOUS EPITHELIAL CELLS SEEN     NO ORGANISMS SEEN     Performed at [facility]   Culture     Final   Value: NO ANAEROBES ISOLATED; CULTURE IN PROGRESS FOR 5 DAYS     Performed at [facility]   Report Status PENDING   Incomplete  CULTURE, ROUTINE-ABSCESS     Status: None   Collection Time    07/01/14  3:09 PM      Result Value Ref Range Status   Specimen Description ABSCESS THIGH LEFT   Final   Special Requests PT ON ANCEF   Final   Gram Stain     Final   Value: FEW WBC PRESENT,BOTH PMN AND MONONUCLEAR     NO SQUAMOUS EPITHELIAL CELLS SEEN     NO ORGANISMS SEEN     Performed at [facility]   Culture     Final   Value: Culture reincubated for better growth     Performed at [facility]   Report Status PENDING   Incomplete   Studies/Results: Dg Chest 2 View  07/03/2014   CLINICAL DATA:  Hypoxia. Pt states she has no chest complaints today. Hx HTN, COPD, diabetes, and heart murmur. Initial encounter.  EXAM: CHEST  2 VIEW  COMPARISON:  CT of 07/27/2008  FINDINGS: Lateral view degraded by patient arm position. Midline trachea. Marked cardiomegaly. Mediastinal contours otherwise within normal limits. Small bilateral pleural effusions. Perihilar predominant interstitial and airspace disease. No lobar consolidation.  IMPRESSION: Bilateral perihilar predominant interstitial and airspace disease.  Favor pulmonary edema. Infection, including atypical s, and drug toxicity could look similar.  Trace bilateral pleural effusions.   Electronically Signed   By: Abigail Miyamoto M.D.    On: 07/03/2014 17:40   Medications: I have reviewed the patient's current medications. Scheduled Meds: .  ceFAZolin (ANCEF) IV  1 g Intravenous 3 times per day  . collagenase   Topical TID  . DULoxetine  60 mg Oral Daily  . enoxaparin (LOVENOX) injection  40 mg Subcutaneous Q24H  . furosemide  40 mg Oral Once  . gabapentin  300 mg Oral QID  . insulin aspart  0-15 Units Subcutaneous TID WC  . insulin aspart  3 Units Subcutaneous TID WC  . insulin glargine  30 Units Subcutaneous QHS  . levothyroxine  100 mcg Oral QAC breakfast  . pantoprazole  40 mg Oral Daily  . polyethylene glycol  17 g Oral Daily  . senna-docusate  1 tablet Oral QHS  . sodium chloride  3 mL Intravenous Q12H  . zolpidem  5 mg Oral QHS   Continuous Infusions:   PRN Meds:.acetaminophen, diphenhydrAMINE, HYDROcodone-acetaminophen, HYDROmorphone (DILAUDID) injection, naphazoline-pheniramine, ondansetron, traMADol Assessment/Plan: Principal Problem:   Cellulitis and abscess Active Problems:   HYPOTHYROIDISM   DM type 2, uncontrolled, with severe neuropathy   Fibromyalgia syndrome   Essential hypertension, benign   Dysuria   Unspecified constipation   Lymphadenopathy   Sepsis   Hyperglycemia   Unspecified protein-calorie malnutrition   Liver function test abnormality   Acute renal failure superimposed on stage 3 chronic kidney disease   Hyponatremia   Leukocytosis  53 yo female with DM II, CKD, HTN here with left thigh abscess.  #Sepsis secondary to abscess of the medial proximal left thigh no abscess seen on CT pelvis initially, surgery I/Ded abscess. Remains afebrile. WBC trending down slowly. -previously on vanc/zosyn>>clinda>>now cefazolin. If she continues to improve, will do keflex or something else based on wound culture upon discharge.  - [place]. Wound cx NGTD.  -prn Norco 5-325 mg 2 tablets q6 prn pain - CXR obtained, shows likely volume overload. -Continue ISS and ambulate to avoid atelectasis  and PNA. - will try lasix 1m once today. Will repeat CXR tomorrow.   # Leukocytosis - normal WBC on may 2015. Admitted with WBC in 30.1. Was upto 43.9 but improving after abscess drainage.  - has mature neutrophils with toxic granulations likely 2/2 to infection. But she also has lot of early cells such as myelocytes and promyelocytes, targe cells and rare nucleated RBCs - will see if WBC improves with above. If not, may be developing early CML. Will pursue CML w/up later including bcr-ABL if above fails.  #Transaminitis - chronically elevated alk phos since ~2013. AST/alt was elevated but Improving. Tbili remains up but trended down from admission. Hep panel negative. Iron normal. U/s abdomen normal. - Alk phos chronically high + AMA positive + no obstruction seen on ab u/s. 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  - Will need to be followed outpatient  #Acute on chronic normocytic anemia - baseline around 11, now 6.9 today. Acute drop likely from surgery. Chronic etiology unclear. Differential includes include CKD, hemolysis, myeloma, thyroid disease? TSH high on 2014 and previously. Previous Anemia panel didn't show iron deficiency. Ferritin not drawn recently.  - will check SPEP/UPEP, haptoglobin, TSH, LDL, retic count.  #Acute on chronic CKD III: FENa 0.5% - prerenal. Increased to 2.35 but down overall. Baseline around 1.6. -  continue to monitor.  #DM type 2, uncontrolled, with severe neuropathy with hyperglycemia: Hgb A1c 9.7.came in with bg 447. BG under 140's now. - lantus 40 at home. Lowered to 30 Units here with meal time coverage. -Will need outpatient follow up with [facility]   #Essential hypertension, benign  -Currently normotensive. -continue to hold home olmesartan as BP is low in 100's.  -monitor BP   #UTI: Insignificant growth on UCx, no further need for abx.  #Chest pain: likely MSK, resolved -Protonix 40 meq  qd  #Unspecified constipation  -Senna-S qhs  -Miralax daily  #Hypothyroidism -Continue Synthyroid 100 mcg qd   #Fibromyalgia  -Continue Cymbalta 60 mg daily, Neurontin 300 mg qid  #Headache  -prn Tylenol 325 daily   #Nausea  -prn Zofran q6 hours   #Insomina  -continued home Ambien 5 mg qhs. Only uses as needed.   FEN: Continues to have hyponatremia but improving. -H/H carb modified diet   DVT ppx  -Lovenox, scds  Dispo: Disposition is deferred at this time, awaiting improvement of current medical problems.  Anticipated discharge in approximately 2 day(s).   The patient does have a current PCP Luan Moore, MD) and does need an [facility] hospital follow-up appointment after discharge.  The patient does not have transportation limitations that hinder transportation to clinic appointments.  .Services Needed at time of discharge: Y = Yes, Blank = No PT:   OT:   RN:   Equipment:   Other:     LOS: 7 days   Dellia Nims, MD 07/04/2014, 12:04 PM

## 2014-07-04 NOTE — Progress Notes (Signed)
Physical Therapy Wound Evaluation and Treatment Patient Details  Name: Kayla [name] MRN: [id number] Date of Birth: 04-19-1961  Today's Date: 07/04/2014 Time: 5993-5701 Time Calculation (min): 54 min  Subjective  Subjective: pt reports this place started as a small bump Patient and Family Stated Goals: heal her wounds Date of Onset: 06/20/14 Prior Treatments: I&D, wet to dry dressings  Pain Score: Pain Score: 0 (throughout treatment) Pt was pre-medicated for pain with IV pain medicine  Wound Assessment  Clinical Statement: Pt with 2 large Lt groin wounds with significant amount of necrotic tissue present. Agree wounds should do well with hydrotherapy and enzymatic debridement to decrease necrotic tissue and promote granulation/healing.  Wound / Incision (Open or Dehisced) 06/27/14 Lt anterior groin (Active)  Dressing Type ABD;Barrier Film (skin prep);Gauze (Comment);Moist to moist;Tape dressing 07/04/2014  3:31 PM  Dressing Changed Changed 07/04/2014  3:31 PM  Dressing Status Clean;Dry;Intact 07/04/2014  3:31 PM  Site / Wound Assessment Granulation tissue;Painful;Pale;Pink;Yellow 07/04/2014  3:31 PM  % Wound base Red or Granulating 30% 07/04/2014  3:31 PM  % Wound base Yellow 70% 07/04/2014  3:31 PM  % Wound base Black 0% 07/04/2014  3:31 PM  % Wound base Other (Comment) 0% 07/04/2014  3:31 PM  Peri-wound Assessment Edema;Erythema (non-blanchable);Induration 07/04/2014  3:31 PM  Wound Length (cm) 1.4 cm 07/04/2014  3:31 PM  Wound Width (cm) 12.4 cm 07/04/2014  3:31 PM  Wound Depth (cm) 3 cm 07/04/2014  3:31 PM  Undermining (cm) @ 2:00=5.4 07/04/2014  3:31 PM  Margins Unattached edges (unapproximated) 07/04/2014  3:31 PM  Closure None 07/04/2014  3:31 PM  Drainage Amount Minimal 07/04/2014  3:31 PM  Drainage Description Serosanguineous 07/04/2014  3:31 PM  Non-staged Wound Description Full thickness 07/04/2014  3:31 PM  Treatment Debridement (Selective);Hydrotherapy (Pulse lavage) 07/04/2014  3:31 PM      Wound / Incision (Open or Dehisced) 06/27/14 Lt posterior groin (Active)  Dressing Type ABD;Barrier Film (skin prep);Gauze (Comment);Moist to moist;Tape dressing 07/04/2014  3:31 PM  Dressing Changed Changed 07/04/2014  3:31 PM  Dressing Status Clean;Dry;Intact 07/04/2014  3:31 PM  Site / Wound Assessment Granulation tissue;Pale;Yellow 07/04/2014  3:31 PM  % Wound base Red or Granulating 40% 07/04/2014  3:31 PM  % Wound base Yellow 60% 07/04/2014  3:31 PM  % Wound base Black 0% 07/04/2014  3:31 PM  % Wound base Other (Comment) 0% 07/04/2014  3:31 PM  Peri-wound Assessment Maceration;Induration 07/04/2014  3:31 PM  Wound Length (cm) 2.1 cm 07/04/2014  3:31 PM  Wound Width (cm) 11.8 cm 07/04/2014  3:31 PM  Wound Depth (cm) 1.8 cm 07/04/2014  3:31 PM  Undermining (cm) @ 3:00 2.7 cm 07/04/2014  3:31 PM  Margins Unattached edges (unapproximated) 07/04/2014  3:31 PM  Closure None 07/04/2014  3:31 PM  Drainage Amount Minimal 07/04/2014  3:31 PM  Drainage Description Serosanguineous 07/04/2014  3:31 PM  Non-staged Wound Description Full thickness 07/04/2014  3:31 PM  Treatment Debridement (Selective);Hydrotherapy (Pulse lavage) 07/04/2014  3:31 PM     Incision (Closed) (Active)   Hydrotherapy Pulsed lavage therapy - wound location: Lt groin wounds Pulsed Lavage with Suction (psi): 4 psi (to 8) Pulsed Lavage with Suction - Normal Saline Used: 1000 mL Pulsed Lavage Tip: Tip with splash shield Selective Debridement Selective Debridement - Location: Lt groin wounds Selective Debridement - Tools Used: Forceps;Scalpel;Scissors Selective Debridement - Tissue Removed: yellow slough, scored yellow eschar   Wound Assessment and Plan  Wound Therapy - Assess/Plan/Recommendations Wound Therapy - Clinical Statement:  Pt with 2 large Lt groin wounds with significant amount of necrotic tissue present. Agree wounds should do well with hydrotherapy and enzymatic debridement to decrease necrotic tissue and promote  granulation/healing. Wound Therapy - Functional Problem List: impairs all mobility due to pain Factors Delaying/Impairing Wound Healing: Diabetes Mellitus;Infection - systemic/local;Immobility;Multiple medical problems Hydrotherapy Plan: Debridement;Dressing change;Patient/family education;Pulsatile lavage with suction Wound Therapy - Frequency: 6X / week Wound Therapy - Follow Up Recommendations: Home health RN Wound Plan: see above  Wound Therapy Goals- Improve the function of patient's integumentary system by progressing the wound(s) through the phases of wound healing (inflammation - proliferation - remodeling) by: Decrease Necrotic Tissue to: 40% Decrease Necrotic Tissue - Progress: Goal set today Increase Granulation Tissue to: 60% Increase Granulation Tissue - Progress: Goal set today Improve Drainage Characteristics: Min;Serous Improve Drainage Characteristics - Progress: Goal set today Patient/Family will be able to : verbalize plans for wound care on discharge  Goals/treatment plan/discharge plan were made with and agreed upon by patient/family: Yes Time For Goal Achievement: 7 days Wound Therapy - Potential for Goals: Good  Goals will be updated until maximal potential achieved or discharge criteria met.  Discharge criteria: when goals achieved, discharge from hospital, MD decision/surgical intervention, no progress towards goals, refusal/missing three consecutive treatments without notification or medical reason.  GP     Neave Lenger 07/04/2014, 3:47 PM Pager [phone]

## 2014-07-04 NOTE — Progress Notes (Signed)
[facility] Progress Note  3 Days Post-Op  Subjective: Pt doing well with dressing changes.  Still having pain, but tolerable.  Having trouble breathing.  Ambulating OOB.    Objective: Vital signs in last 24 hours: Temp:  [98 F (36.7 C)-98.4 F (36.9 C)] 98.4 F (36.9 C) (10/05 0559) Pulse Rate:  [81-85] 81 (10/05 0559) Resp:  [16-18] 18 (10/05 0559) BP: (132-141)/(55-67) 141/65 mmHg (10/05 0559) SpO2:  [96 %-100 %] 100 % (10/05 0559) Last BM Date: 06/29/14  Intake/Output from previous day: 10/04 0701 - 10/05 0700 In: 100 [P.O.:100] Out: 300 [Urine:300] Intake/Output this shift:    PE: Gen:  Alert, NAD, pleasant Skin: warm and dry, 6cm incision at superior aspect of left thigh wound which is 2.5cm deep, at wound depth is 90% superficial slough. Inferior groin wound is 8cm in length and more superficial, most proximal part is 1cm deep   07/04/14 before hydrotherapy  07/04/14 before hydrotherapy   Lab Results:   Recent Labs  07/03/14 0409 07/04/14 0320  WBC 36.2* 34.7*  HGB 6.9* 6.9*  HCT 19.7* 19.3*  PLT 215 213   BMET  Recent Labs  07/02/14 0615 07/04/14 0320  NA 135* 135*  K 3.7 3.7  CL 104 104  CO2 21 22  GLUCOSE 99 221*  BUN 13 9  CREATININE 1.37* 1.18*  CALCIUM 9.2 8.8   PT/INR No results found for this basename: LABPROT, INR,  in the last 72 hours CMP     Component Value Date/Time   NA 135* 07/04/2014 0320   K 3.7 07/04/2014 0320   CL 104 07/04/2014 0320   CO2 22 07/04/2014 0320   GLUCOSE 221* 07/04/2014 0320   BUN 9 07/04/2014 0320   CREATININE 1.18* 07/04/2014 0320   CREATININE 1.13* 08/24/2012 1604   CALCIUM 8.8 07/04/2014 0320   PROT 6.7 07/02/2014 0615   ALBUMIN 1.9* 07/02/2014 0615   AST 36 07/02/2014 0615   ALT 38* 07/02/2014 0615   ALKPHOS 335* 07/02/2014 0615   BILITOT 2.1* 07/02/2014 0615   GFRNONAA 52* 07/04/2014 0320   GFRNONAA 57* 08/24/2012 1604   GFRAA 60* 07/04/2014 0320   GFRAA 65 08/24/2012 1604   Lipase      Component Value Date/Time   LIPASE 67* 08/17/2012 2108       Studies/Results: Dg Chest 2 View  07/03/2014   CLINICAL DATA:  Hypoxia. Pt states she has no chest complaints today. Hx HTN, COPD, diabetes, and heart murmur. Initial encounter.  EXAM: CHEST  2 VIEW  COMPARISON:  CT of 07/27/2008  FINDINGS: Lateral view degraded by patient arm position. Midline trachea. Marked cardiomegaly. Mediastinal contours otherwise within normal limits. Small bilateral pleural effusions. Perihilar predominant interstitial and airspace disease. No lobar consolidation.  IMPRESSION: Bilateral perihilar predominant interstitial and airspace disease. Favor pulmonary edema. Infection, including atypical s, and drug toxicity could look similar.  Trace bilateral pleural effusions.   Electronically Signed   By: Abigail Miyamoto M.D.   On: 07/03/2014 17:40    Anti-infectives: Anti-infectives   Start     Dose/Rate Route Frequency Ordered Stop   07/01/14 1400  ceFAZolin (ANCEF) IVPB 1 g/50 mL premix     1 g 100 mL/hr over 30 Minutes Intravenous 3 times per day 07/01/14 1242     06/29/14 1200  clindamycin (CLEOCIN) capsule 300 mg  Status:  Discontinued     300 mg Oral 4 times per day 06/29/14 1040 07/01/14 1242   06/28/14 0600  vancomycin (VANCOCIN)  IVPB 750 mg/150 ml premix  Status:  Discontinued     750 mg 150 mL/hr over 60 Minutes Intravenous Every 12 hours 06/27/14 1850 06/29/14 1040   06/27/14 2300  piperacillin-tazobactam (ZOSYN) IVPB 3.375 g  Status:  Discontinued     3.375 g 12.5 mL/hr over 240 Minutes Intravenous 3 times per day 06/27/14 1716 06/29/14 1040   06/27/14 1800  vancomycin (VANCOCIN) 1,500 mg in sodium chloride 0.9 % 500 mL IVPB     1,500 mg 250 mL/hr over 120 Minutes Intravenous  Once 06/27/14 1716 06/27/14 2012   06/27/14 1730  piperacillin-tazobactam (ZOSYN) IVPB 3.375 g     3.375 g 100 mL/hr over 30 Minutes Intravenous  Once 06/27/14 1716 06/27/14 1842       Assessment/Plan Left thigh  abscess/cellulitis/mass  Leukocytosis - 34.7 Type 2 diabetes insulin dependent  Hgb 6.9  Plan:  1. Would keep the patient until her WBC is significantly down closer to normal, would continue IV antibiotics.  May need to look for another source of her elevated WBC as her wounds are clean 2. Dressing changes TID WD, start hydrotherapy QD and santyl TID, will need BID dressing changes to wound upon discharge home (may need HH)  3. Encourage ambulating  4. No further areas to surgically debride.  Will follow MWF while still here.  May need referral to OP wound center since these will be chronic wounds     LOS: 7 days    DORT, Ianmichael Amescua 07/04/2014, 8:16 AM Pager: [phone]

## 2014-07-04 NOTE — Progress Notes (Signed)
Patient ID: Kayla [name], female   DOB: Nov 12, 1960, 53 y.o.   MRN: [id number] Attending physician progress note: I personally interviewed and examined this patient today together with resident physician Dr. Dellia Nims and I concur with his evaluation and management plan. Patient was taken to surgery on Friday, October 2 and had incision and drainage of an abscess that had developed in the area of cellulitis proximal, medial, left thigh. She is still receiving parenteral antibiotics, now single agent cefazolin. Her white count is starting to trend downward from peak value of 44,000 preop to current value of 35,000. Wound dressing is dry and I did not remove it. There is no surgical drain in place. Additional problems that we are addressing include: #1. Abnormal liver functions. It appears that she had an acute event affecting her liver approximately one year ago. Hepatitis profile and HIV testing negative at that time. Repeat hepatitis profile this admission remains negative. Her bilirubin got as high as 4.9 during this admission and is currently down to 1.4. Transient rise in her transaminases approximately twice normal which also normalized. There is a progressive rise in alkaline phosphatase as high 384 today but previous records show alkaline phosphatase has been chronically elevated and at time of acute, idiopathic, liver compromise in November of 2013  value was 227  lab normal up to 117. She has known autoimmune thyroid disease. Antimitochondrial antibodies were detected when checked during this admission and it is possible that she has autoimmune liver disease as well. The possibility of previous toxic medication reaction with chronic liver function changes not excluded. #2. Chronic normochromic anemia with acute fall during this admission secondary to acute infection. We will screen her for a hemolytic process and for multiple myeloma. LDH may be unreliable in view of acute infection and  surgery.  Murriel Hopper, MD, [place]  Hematology-Oncology/Internal Medicine

## 2014-07-05 DIAGNOSIS — R768 Other specified abnormal immunological findings in serum: Secondary | ICD-10-CM | POA: Diagnosis not present

## 2014-07-05 LAB — HAPTOGLOBIN: Haptoglobin: 204 mg/dL — ABNORMAL HIGH (ref 45–215)

## 2014-07-05 LAB — CULTURE, ROUTINE-ABSCESS

## 2014-07-05 LAB — CBC WITH DIFFERENTIAL/PLATELET
Basophils Absolute: 0.3 10*3/uL — ABNORMAL HIGH (ref 0.0–0.1)
Basophils Relative: 1 % (ref 0–1)
Eosinophils Absolute: 0.7 10*3/uL (ref 0.0–0.7)
Eosinophils Relative: 2 % (ref 0–5)
HCT: 18.6 % — ABNORMAL LOW (ref 36.0–46.0)
Hemoglobin: 6.4 g/dL — CL (ref 12.0–15.0)
Lymphocytes Relative: 14 % (ref 12–46)
Lymphs Abs: 4.6 10*3/uL — ABNORMAL HIGH (ref 0.7–4.0)
MCH: 30.6 pg (ref 26.0–34.0)
MCHC: 34.4 g/dL (ref 30.0–36.0)
MCV: 89 fL (ref 78.0–100.0)
Monocytes Absolute: 1 10*3/uL (ref 0.1–1.0)
Monocytes Relative: 3 % (ref 3–12)
Neutro Abs: 26.1 10*3/uL — ABNORMAL HIGH (ref 1.7–7.7)
Neutrophils Relative %: 80 % — ABNORMAL HIGH (ref 43–77)
Platelets: 224 10*3/uL (ref 150–400)
RBC: 2.09 MIL/uL — ABNORMAL LOW (ref 3.87–5.11)
RDW: 17 % — ABNORMAL HIGH (ref 11.5–15.5)
WBC: 32.7 10*3/uL — ABNORMAL HIGH (ref 4.0–10.5)

## 2014-07-05 LAB — GLUCOSE, CAPILLARY
Glucose-Capillary: 109 mg/dL — ABNORMAL HIGH (ref 70–99)
Glucose-Capillary: 120 mg/dL — ABNORMAL HIGH (ref 70–99)
Glucose-Capillary: 123 mg/dL — ABNORMAL HIGH (ref 70–99)
Glucose-Capillary: 97 mg/dL (ref 70–99)

## 2014-07-05 LAB — DIRECT ANTIGLOBULIN TEST (NOT AT ARMC)
DAT, IgG: NEGATIVE
DAT, complement: NEGATIVE

## 2014-07-05 LAB — PREPARE RBC (CROSSMATCH)

## 2014-07-05 LAB — ABO/RH: ABO/RH(D): O POS

## 2014-07-05 MED ORDER — SODIUM CHLORIDE 0.9 % IV SOLN
Freq: Once | INTRAVENOUS | Status: AC
  Administered 2014-07-05: 16:00:00 via INTRAVENOUS

## 2014-07-05 MED ORDER — FUROSEMIDE 10 MG/ML IJ SOLN
20.0000 mg | Freq: Once | INTRAMUSCULAR | Status: AC
  Administered 2014-07-05: 20 mg via INTRAVENOUS
  Filled 2014-07-05: qty 2

## 2014-07-05 MED ORDER — FUROSEMIDE 10 MG/ML IJ SOLN: 20.0000 mg | Freq: Once | INTRAMUSCULAR | Status: DC

## 2014-07-05 NOTE — Progress Notes (Signed)
Patient ID: Kayla [name], female   DOB: 1960-10-30, 53 y.o.   MRN: [id number] Medicine attending: I personally examined this patient this morning together with resident physician Dr. Dellia Nims and I concur with his evaluation and management plan as recorded in his progress note. We reviewed her peripheral blood film again this morning. She has had a progressive fall in her hemoglobin. There is polychromasia on the smear and the presence of spherocytes. Uncorrected reticulocyte count elevated at 8%. LDH is elevated but hard to interpret in view of recent surgical intervention. Haptoglobin is elevated not decreased also difficult to interpret since likely just an acute phase reactant which could mask an underlying low-grade hemolytic process. A Coombs' test is negative. 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 We will get additional laboratory testing to delineate whether or not this is related to a hemoglobinopathy, red cell enzyme deficiency, or a medication. The patient's hemoglobin now at a critical level. I reviewed with her the risks and benefits of transfusion therapy and she has agreed to accept a blood transfusion at this time. With respect to her left thigh cellulitis, she remains afebrile, white blood count slowly coming down, decreased immature cells on today's blood film but still occasional myelocytes and a rare nucleated red blood cell. She is going to have a debridement of the wound again today.  Murriel Hopper, MD, [place]  Hematology-Oncology/Internal Medicine

## 2014-07-05 NOTE — Progress Notes (Signed)
Physical Therapy Wound Treatment Patient Details  Name: Kayla [name] MRN: [id number] Date of Birth: October 15, 1960  Today's Date: 07/05/2014 Time: 1006-1107 Time Calculation (min): 61 min  Subjective  Subjective: States pain is well controlled with IV meds prior to treatment Patient and Family Stated Goals: heal her wounds Date of Onset: 06/20/14 Prior Treatments: I&D, wet to dry dressings  Pain Score:   denies pain throughout! (reports with pain medicine, she's fine)  Wound Assessment  Clinical Statement: Edema has decreased. Yellow necrotic tissue softening and more debrided this date (although there was underlying adherent necrotic tissue present). Deepest areas difficult to access for debridement.   Wound / Incision (Open or Dehisced) 06/27/14 Lt anterior groin (Active)  Dressing Type ABD;Barrier Film (skin prep);Gauze (Comment);Moist to moist;Tape dressing;Transparent dressing 07/05/2014 11:20 AM  Dressing Changed Changed 07/05/2014 11:20 AM  Dressing Status Clean;Dry;Intact 07/05/2014 11:20 AM  Dressing Change Frequency Other (Comment) 07/04/2014 11:52 PM  Site / Wound Assessment Granulation tissue;Painful;Pale;Pink;Yellow 07/05/2014 11:20 AM  % Wound base Red or Granulating 30% 07/05/2014 11:20 AM  % Wound base Yellow 70% 07/05/2014 11:20 AM  % Wound base Black 0% 07/05/2014 11:20 AM  % Wound base Other (Comment) 0% 07/05/2014 11:20 AM  Peri-wound Assessment Edema;Erythema (non-blanchable);Induration 07/05/2014 11:20 AM  Wound Length (cm) 1.4 cm 07/04/2014  3:31 PM  Wound Width (cm) 12.4 cm 07/04/2014  3:31 PM  Wound Depth (cm) 3 cm 07/04/2014  3:31 PM  Undermining (cm) @ 2:00=5.4 07/04/2014  3:31 PM  Margins Unattached edges (unapproximated) 07/05/2014 11:20 AM  Closure None 07/05/2014 11:20 AM  Drainage Amount Minimal 07/05/2014 11:20 AM  Drainage Description Serosanguineous 07/05/2014 11:20 AM  Non-staged Wound Description Full thickness 07/05/2014 11:20 AM  Treatment Debridement  (Selective);Hydrotherapy (Pulse lavage) 07/05/2014 11:20 AM     Wound / Incision (Open or Dehisced) 06/27/14 Lt posterior groin (Active)  Dressing Type ABD;Barrier Film (skin prep);Gauze (Comment);Moist to moist;Tape dressing;Transparent dressing 07/05/2014 11:20 AM  Dressing Changed Changed 07/05/2014 11:20 AM  Dressing Status Clean;Dry;Intact 07/05/2014 11:20 AM  Dressing Change Frequency Other (Comment) 07/04/2014 11:52 PM  Site / Wound Assessment Granulation tissue;Pale;Yellow 07/05/2014 11:20 AM  % Wound base Red or Granulating 35% 07/05/2014 11:20 AM  % Wound base Yellow 60% 07/05/2014 11:20 AM  % Wound base Black 5% 07/05/2014 11:20 AM  % Wound base Other (Comment) 0% 07/05/2014 11:20 AM  Peri-wound Assessment Maceration;Induration 07/05/2014 11:20 AM  Wound Length (cm) 2.1 cm 07/04/2014  3:31 PM  Wound Width (cm) 11.8 cm 07/04/2014  3:31 PM  Wound Depth (cm) 1.8 cm 07/04/2014  3:31 PM  Undermining (cm) @ 3:00 2.7 cm 07/04/2014  3:31 PM  Margins Unattached edges (unapproximated) 07/05/2014 11:20 AM  Closure None 07/05/2014 11:20 AM  Drainage Amount Minimal 07/05/2014 11:20 AM  Drainage Description Serosanguineous 07/05/2014 11:20 AM  Non-staged Wound Description Full thickness 07/05/2014 11:20 AM  Treatment Debridement (Selective);Hydrotherapy (Pulse lavage) 07/05/2014 11:20 AM     Incision (Closed) (Active)   Hydrotherapy Pulsed lavage therapy - wound location: Lt groin wounds Pulsed Lavage with Suction (psi): 4 psi (to 8) Pulsed Lavage with Suction - Normal Saline Used: 1000 mL Pulsed Lavage Tip: Tip with splash shield Selective Debridement Selective Debridement - Location: Lt groin wounds Selective Debridement - Tools Used: Forceps;Scissors Selective Debridement - Tissue Removed: yellow slough   Wound Assessment and Plan  Wound Therapy - Assess/Plan/Recommendations Wound Therapy - Clinical Statement: Edema has decreased. Yellow necrotic tissue softening and more debrided this date  (although there was underlying adherent necrotic  tissue present). Deepest areas difficult to access for debridement.  Wound Therapy - Functional Problem List: impairs all mobility due to pain Factors Delaying/Impairing Wound Healing: Diabetes Mellitus;Infection - systemic/local;Immobility;Multiple medical problems Hydrotherapy Plan: Debridement;Dressing change;Patient/family education;Pulsatile lavage with suction Wound Therapy - Frequency: 6X / week Wound Therapy - Follow Up Recommendations: Home health RN Wound Plan: see above  Wound Therapy Goals- Improve the function of patient's integumentary system by progressing the wound(s) through the phases of wound healing (inflammation - proliferation - remodeling) by: Decrease Necrotic Tissue to: 40% Decrease Necrotic Tissue - Progress: Progressing toward goal Increase Granulation Tissue to: 60% Increase Granulation Tissue - Progress: Progressing toward goal Improve Drainage Characteristics: Min;Serous Improve Drainage Characteristics - Progress: Progressing toward goal Patient/Family will be able to : verbalize plans for wound care on discharge  Patient/Family Instruction Goal - Progress: Progressing toward goal  Goals will be updated until maximal potential achieved or discharge criteria met.  Discharge criteria: when goals achieved, discharge from hospital, MD decision/surgical intervention, no progress towards goals, refusal/missing three consecutive treatments without notification or medical reason.  GP     Tenesha Garza 07/05/2014, 11:27 AM Pager [phone]

## 2014-07-05 NOTE — Progress Notes (Signed)
Patient ID: Kayla [name], female   DOB: 1961-05-23, 53 y.o.   MRN: [id number]     [facility]      [address] [place], [place] [address]    Phone: [phone] FAX: [phone]     Subjective: Afebrile.  WBC down down, but not significantly.    Objective:  Vital signs:  Filed Vitals:   07/04/14 0559 07/04/14 1317 07/04/14 2116 07/05/14 0502  BP: 141/65 139/63 140/65 142/75  Pulse: 81 80 82 87  Temp: 98.4 F (36.9 C) 98.3 F (36.8 C) 98.8 F (37.1 C) 99.1 F (37.3 C)  TempSrc: Oral Oral Oral Oral  Resp: '18 18 20 18  ' Height:      Weight:    194 lb 0.1 oz (88 kg)  SpO2: 100% 98% 98% 99%    Last BM Date: 07/04/14  Intake/Output   Yesterday:  10/05 0701 - 10/06 0700 In: 590 [P.O.:240; IV Piggyback:350] Out: 1001 [Urine:1000; Stool:1] This shift:  Physical Exam: Skin: left medial wound-areas of yellow slough, granulation tissue.  The lateral wound is clean, induration without areas of fluctuance.    Problem List:   Principal Problem:   Cellulitis and abscess Active Problems:   HYPOTHYROIDISM   DM type 2, uncontrolled, with severe neuropathy   Fibromyalgia syndrome   Essential hypertension, benign   Dysuria   Unspecified constipation   Lymphadenopathy   Sepsis   Hyperglycemia   Unspecified protein-calorie malnutrition   Liver function test abnormality   Acute renal failure superimposed on stage 3 chronic kidney disease   Hyponatremia   Leukocytosis   Antimitochondrial antibody positive    Results:   Labs: Results for orders placed during the hospital encounter of 06/27/14 (from the past 48 hour(s))  GLUCOSE, CAPILLARY     Status: None   Collection Time    07/03/14 12:10 PM      Result Value Ref Range   Glucose-Capillary 83  70 - 99 mg/dL  GLUCOSE, CAPILLARY     Status: Abnormal   Collection Time    07/03/14  5:04 PM      Result Value Ref Range   Glucose-Capillary 134 (*) 70 - 99 mg/dL  GLUCOSE,  CAPILLARY     Status: Abnormal   Collection Time    07/03/14  9:35 PM      Result Value Ref Range   Glucose-Capillary 149 (*) 70 - 99 mg/dL   Comment 1 Notify RN    CBC     Status: Abnormal   Collection Time    07/04/14  3:20 AM      Result Value Ref Range   WBC 34.7 (*) 4.0 - 10.5 K/uL   RBC 2.17 (*) 3.87 - 5.11 MIL/uL   Hemoglobin 6.9 (*) 12.0 - 15.0 g/dL   Comment: REPEATED TO VERIFY     CRITICAL VALUE NOTED.  VALUE IS CONSISTENT WITH PREVIOUSLY REPORTED AND CALLED VALUE.   HCT 19.3 (*) 36.0 - 46.0 %   MCV 88.9  78.0 - 100.0 fL   MCH 31.8  26.0 - 34.0 pg   MCHC 35.8  30.0 - 36.0 g/dL   RDW 16.0 (*) 11.5 - 15.5 %   Platelets 213  150 - 400 K/uL  BASIC METABOLIC PANEL     Status: Abnormal   Collection Time    07/04/14  3:20 AM      Result Value Ref Range   Sodium 135 (*) 137 - 147 mEq/L  Potassium 3.7  3.7 - 5.3 mEq/L   Chloride 104  96 - 112 mEq/L   CO2 22  19 - 32 mEq/L   Glucose, Bld 221 (*) 70 - 99 mg/dL   BUN 9  6 - 23 mg/dL   Creatinine, Ser 1.18 (*) 0.50 - 1.10 mg/dL   Calcium 8.8  8.4 - 10.5 mg/dL   GFR calc non Af Amer 52 (*) >90 mL/min   GFR calc Af Amer 60 (*) >90 mL/min   Comment: (NOTE)     The eGFR has been calculated using the CKD EPI equation.     This calculation has not been validated in all clinical situations.     eGFR's persistently <90 mL/min signify possible Chronic Kidney     Disease.   Anion gap 9  5 - 15  GLUCOSE, CAPILLARY     Status: Abnormal   Collection Time    07/04/14  7:44 AM      Result Value Ref Range   Glucose-Capillary 171 (*) 70 - 99 mg/dL  GLUCOSE, CAPILLARY     Status: Abnormal   Collection Time    07/04/14 11:54 AM      Result Value Ref Range   Glucose-Capillary 115 (*) 70 - 99 mg/dL  COMPREHENSIVE METABOLIC PANEL     Status: Abnormal   Collection Time    07/04/14  2:14 PM      Result Value Ref Range   Sodium 136 (*) 137 - 147 mEq/L   Potassium 3.7  3.7 - 5.3 mEq/L   Chloride 102  96 - 112 mEq/L   CO2 25  19 - 32  mEq/L   Glucose, Bld 111 (*) 70 - 99 mg/dL   BUN 8  6 - 23 mg/dL   Creatinine, Ser 1.21 (*) 0.50 - 1.10 mg/dL   Calcium 9.5  8.4 - 10.5 mg/dL   Total Protein 7.1  6.0 - 8.3 g/dL   Albumin 2.1 (*) 3.5 - 5.2 g/dL   AST 30  0 - 37 U/L   ALT 14  0 - 35 U/L   Alkaline Phosphatase 384 (*) 39 - 117 U/L   Total Bilirubin 1.4 (*) 0.3 - 1.2 mg/dL   GFR calc non Af Amer 51 (*) >90 mL/min   GFR calc Af Amer 59 (*) >90 mL/min   Comment: (NOTE)     The eGFR has been calculated using the CKD EPI equation.     This calculation has not been validated in all clinical situations.     eGFR's persistently <90 mL/min signify possible Chronic Kidney     Disease.   Anion gap 9  5 - 15  HAPTOGLOBIN     Status: Abnormal   Collection Time    07/04/14  2:14 PM      Result Value Ref Range   Haptoglobin 204 (*) 45 - 215 mg/dL   Comment: Performed at [facility] DE[facility]DRO[facility]NA[facility]     Status: Abnormal   Collection Time    07/04/14  2:14 PM      Result Value Ref Range   LDH 296 (*) 94 - 250 U/L  RETICULOCYTES     Status: Abnormal   Collection Time    07/04/14  2:14 PM      Result Value Ref Range   Retic Ct Pct 8.0 (*) 0.4 - 3.1 %   RBC. 2.31 (*) 3.87 - 5.11 MIL/uL   Retic Count, Manual 184.8  19.0 - 186.0 K/uL  GLUCOSE, CAPILLARY     Status: Abnormal   Collection Time    07/04/14  4:49 PM      Result Value Ref Range   Glucose-Capillary 134 (*) 70 - 99 mg/dL  GLUCOSE, CAPILLARY     Status: Abnormal   Collection Time    07/04/14  9:40 PM      Result Value Ref Range   Glucose-Capillary 140 (*) 70 - 99 mg/dL   Comment 1 Notify RN     Comment 2 Documented in Chart    CBC WITH DIFFERENTIAL     Status: Abnormal   Collection Time    07/05/14  6:15 AM      Result Value Ref Range   WBC 32.7 (*) 4.0 - 10.5 K/uL   Comment: ADJUSTED FOR NUCLEATED RBC'S   RBC 2.09 (*) 3.87 - 5.11 MIL/uL   Hemoglobin 6.4 (*) 12.0 - 15.0 g/dL   Comment: CRITICAL RESULT CALLED TO, READ BACK BY AND VERIFIED WITH:      J.ZHU,RN 0748 07/05/14 CLARK,S   HCT 18.6 (*) 36.0 - 46.0 %   MCV 89.0  78.0 - 100.0 fL   MCH 30.6  26.0 - 34.0 pg   MCHC 34.4  30.0 - 36.0 g/dL   RDW 17.0 (*) 11.5 - 15.5 %   Platelets 224  150 - 400 K/uL   Neutrophils Relative % 80 (*) 43 - 77 %   Lymphocytes Relative 14  12 - 46 %   Monocytes Relative 3  3 - 12 %   Eosinophils Relative 2  0 - 5 %   Basophils Relative 1  0 - 1 %   Neutro Abs 26.1 (*) 1.7 - 7.7 K/uL   Lymphs Abs 4.6 (*) 0.7 - 4.0 K/uL   Monocytes Absolute 1.0  0.1 - 1.0 K/uL   Eosinophils Absolute 0.7  0.0 - 0.7 K/uL   Basophils Absolute 0.3 (*) 0.0 - 0.1 K/uL   RBC Morphology RARE NRBCs     Comment: POLYCHROMASIA PRESENT     TARGET CELLS   WBC Morphology TOXIC GRANULATION     Comment: MODERATE LEFT SHIFT (>5% METAS AND MYELOS,OCC PRO NOTED)  DIRECT ANTIGLOBULIN TEST     Status: None   Collection Time    07/05/14  6:15 AM      Result Value Ref Range   DAT, complement NEG     DAT, IgG NEG    GLUCOSE, CAPILLARY     Status: Abnormal   Collection Time    07/05/14  7:42 AM      Result Value Ref Range   Glucose-Capillary 109 (*) 70 - 99 mg/dL    Imaging / Studies: Dg Chest 2 View  07/03/2014   CLINICAL DATA:  Hypoxia. Pt states she has no chest complaints today. Hx HTN, COPD, diabetes, and heart murmur. Initial encounter.  EXAM: CHEST  2 VIEW  COMPARISON:  CT of 07/27/2008  FINDINGS: Lateral view degraded by patient arm position. Midline trachea. Marked cardiomegaly. Mediastinal contours otherwise within normal limits. Small bilateral pleural effusions. Perihilar predominant interstitial and airspace disease. No lobar consolidation.  IMPRESSION: Bilateral perihilar predominant interstitial and airspace disease. Favor pulmonary edema. Infection, including atypical s, and drug toxicity could look similar.  Trace bilateral pleural effusions.   Electronically Signed   By: Abigail Miyamoto M.D.   On: 07/03/2014 17:40    Medications / Allergies:  Scheduled Meds: .   ceFAZolin (ANCEF) IV  1 g Intravenous 3 times per day  .  collagenase   Topical TID  . DULoxetine  60 mg Oral Daily  . enoxaparin (LOVENOX) injection  40 mg Subcutaneous Q24H  . gabapentin  300 mg Oral QID  . insulin aspart  0-15 Units Subcutaneous TID WC  . insulin aspart  3 Units Subcutaneous TID WC  . insulin glargine  30 Units Subcutaneous QHS  . levothyroxine  100 mcg Oral QAC breakfast  . pantoprazole  40 mg Oral Daily  . polyethylene glycol  17 g Oral Daily  . senna-docusate  1 tablet Oral QHS  . sodium chloride  3 mL Intravenous Q12H  . zolpidem  5 mg Oral QHS   Continuous Infusions:  PRN Meds:.acetaminophen, diphenhydrAMINE, HYDROcodone-acetaminophen, HYDROmorphone (DILAUDID) injection, naphazoline-pheniramine, ondansetron, traMADol  Antibiotics: Anti-infectives   Start     Dose/Rate Route Frequency Ordered Stop   07/01/14 1400  ceFAZolin (ANCEF) IVPB 1 g/50 mL premix     1 g 100 mL/hr over 30 Minutes Intravenous 3 times per day 07/01/14 1242     06/29/14 1200  clindamycin (CLEOCIN) capsule 300 mg  Status:  Discontinued     300 mg Oral 4 times per day 06/29/14 1040 07/01/14 1242   06/28/14 0600  vancomycin (VANCOCIN) IVPB 750 mg/150 ml premix  Status:  Discontinued     750 mg 150 mL/hr over 60 Minutes Intravenous Every 12 hours 06/27/14 1850 06/29/14 1040   06/27/14 2300  piperacillin-tazobactam (ZOSYN) IVPB 3.375 g  Status:  Discontinued     3.375 g 12.5 mL/hr over 240 Minutes Intravenous 3 times per day 06/27/14 1716 06/29/14 1040   06/27/14 1800  vancomycin (VANCOCIN) 1,500 mg in sodium chloride 0.9 % 500 mL IVPB     1,500 mg 250 mL/hr over 120 Minutes Intravenous  Once 06/27/14 1716 06/27/14 2012   06/27/14 1730  piperacillin-tazobactam (ZOSYN) IVPB 3.375 g     3.375 g 100 mL/hr over 30 Minutes Intravenous  Once 06/27/14 1716 06/27/14 1842        Assessment/Plan Left thigh abscess/cellulitis POD#4 I&D--Dr. Dennis Bast  -continue with TID WD dressing changes,  hydrotherapy, santyl -continue IV atbx until induration and white count improves -will need HH for dressing changes  -Cx without any growth to date  Erby Pian, [facility] Surgery Pager [phone](7A-4:30P) For consults and floor pages call [phone](7A-4:30P)  07/05/2014 10:50 AM

## 2014-07-05 NOTE — Progress Notes (Signed)
Subjective:  Doing well. Fails to wean off oxygen. Kept refusing transfusion with Hgb 6.4 but convinced her to get some as it should improve her breathing.  Denies any sob, cp, cough, n/v, fever, chills, headache, numbness, weakness, tingling.  Objective: Vital signs in last 24 hours: Filed Vitals:   07/04/14 0559 07/04/14 1317 07/04/14 2116 07/05/14 0502  BP: 141/65 139/63 140/65 142/75  Pulse: 81 80 82 87  Temp: 98.4 F (36.9 C) 98.3 F (36.8 C) 98.8 F (37.1 C) 99.1 F (37.3 C)  TempSrc: Oral Oral Oral Oral  Resp: '18 18 20 18  ' Height:      Weight:    88 kg (194 lb 0.1 oz)  SpO2: 100% 98% 98% 99%   Weight change:   Intake/Output Summary (Last 24 hours) at 07/05/14 0732 Last data filed at 07/04/14 2315  Gross per 24 hour  Intake    590 ml  Output   1001 ml  Net   -411 ml   General: NAD HEENT: [place]/at, anicteric sclera  Cardiac: RRR, no rubs, murmurs or gallops  Pulm: clear to auscultation bilaterally, no wheezes, rales, or rhonchi  Abd: soft, tender to palpation in LLQ, nondistended, BS present  Ext: warm and well perfused, no pedal edema, left thigh abscess site covered with dressing. Some TTP Neuro: alert and oriented X3, cranial nerves II-XII grossly intact. 4/5 strength to extension on right arm.   Lab Results: Basic Metabolic Panel:  Recent Labs Lab 07/04/14 0320 07/04/14 1414  NA 135* 136*  K 3.7 3.7  CL 104 102  CO2 22 25  GLUCOSE 221* 111*  BUN 9 8  CREATININE 1.18* 1.21*  CALCIUM 8.8 9.5   Liver Function Tests:  Recent Labs Lab 07/02/14 0615 07/04/14 1414  AST 36 30  ALT 38* 14  ALKPHOS 335* 384*  BILITOT 2.1* 1.4*  PROT 6.7 7.1  ALBUMIN 1.9* 2.1*   CBC:  Recent Labs Lab 07/01/14 0944 07/02/14 0615  07/03/14 0409 07/04/14 0320  WBC 43.9* 37.4*  < > 36.2* 34.7*  NEUTROABS 37.4* 31.8*  --   --   --   HGB 8.4* 7.4*  < > 6.9* 6.9*  HCT 23.4* 20.5*  < > 19.7* 19.3*  MCV 86.7 86.9  < > 88.3 88.9  PLT 228 PLATELET CLUMPS NOTED ON  SMEAR, COUNT APPEARS ADEQUATE  < > 215 213  < > = values in this interval not displayed. Cardiac Enzymes: No results found for this basename: CKTOTAL, CKMB, CKMBINDEX, TROPONINI,  in the last 168 hours CBG:  Recent Labs Lab 07/03/14 1704 07/03/14 2135 07/04/14 0744 07/04/14 1154 07/04/14 1649 07/04/14 2140  GLUCAP 134* 149* 171* 115* 134* 140*   Hemoglobin A1C: No results found for this basename: HGBA1C,  in the last 168 hours Urinalysis: No results found for this basename: COLORURINE, APPERANCEUR, LABSPEC, PHURINE, GLUCOSEU, HGBUR, BILIRUBINUR, KETONESUR, PROTEINUR, UROBILINOGEN, NITRITE, LEUKOCYTESUR,  in the last 168 hours  Micro Results: Recent Results (from the past 240 hour(s))  CULTURE, BLOOD (ROUTINE X 2)     Status: None   Collection Time    06/27/14  4:55 PM      Result Value Ref Range Status   Specimen Description BLOOD RIGHT ARM   Final   Special Requests BOTTLES DRAWN AEROBIC AND ANAEROBIC 5CC   Final   Culture  Setup Time     Final   Value: 06/27/2014 22:09     Performed at [facility]  Final   Value: NO GROWTH 5 DAYS     Performed at [facility]   Report Status 07/03/2014 FINAL   Final  CULTURE, BLOOD (ROUTINE X 2)     Status: None   Collection Time    06/27/14  9:06 PM      Result Value Ref Range Status   Specimen Description BLOOD LEFT WRIST   Final   Special Requests BOTTLES DRAWN AEROBIC ONLY 5CC   Final   Culture  Setup Time     Final   Value: 06/28/2014 00:26     Performed at [facility]   Culture     Final   Value: NO GROWTH 5 DAYS     Performed at [facility]   Report Status 07/04/2014 FINAL   Final  URINE CULTURE     Status: None   Collection Time    06/27/14 10:34 PM      Result Value Ref Range Status   Specimen Description URINE, CLEAN CATCH   Final   Special Requests NONE   Final   Culture  Setup Time     Final   Value: 06/27/2014 23:11     Performed at [facility]     Final   Value: 2,000 COLONIES/ML     Performed at [facility]   Culture     Final   Value: INSIGNIFICANT GROWTH     Performed at [facility]   Report Status 06/28/2014 FINAL   Final  ANAEROBIC CULTURE     Status: None   Collection Time    07/01/14  3:09 PM      Result Value Ref Range Status   Specimen Description ABSCESS THIGH LEFT   Final   Special Requests PT ON ANCEF   Final   Gram Stain     Final   Value: FEW WBC PRESENT,BOTH PMN AND MONONUCLEAR     NO SQUAMOUS EPITHELIAL CELLS SEEN     NO ORGANISMS SEEN     Performed at [facility]   Culture     Final   Value: NO ANAEROBES ISOLATED; CULTURE IN PROGRESS FOR 5 DAYS     Performed at [facility]   Report Status PENDING   Incomplete  CULTURE, ROUTINE-ABSCESS     Status: None   Collection Time    07/01/14  3:09 PM      Result Value Ref Range Status   Specimen Description ABSCESS THIGH LEFT   Final   Special Requests PT ON ANCEF   Final   Gram Stain     Final   Value: FEW WBC PRESENT,BOTH PMN AND MONONUCLEAR     NO SQUAMOUS EPITHELIAL CELLS SEEN     NO ORGANISMS SEEN     Performed at [facility]   Culture     Final   Value: Culture reincubated for better growth     Performed at [facility]   Report Status PENDING   Incomplete   Studies/Results: Dg Chest 2 View  07/03/2014   CLINICAL DATA:  Hypoxia. Pt states she has no chest complaints today. Hx HTN, COPD, diabetes, and heart murmur. Initial encounter.  EXAM: CHEST  2 VIEW  COMPARISON:  CT of 07/27/2008  FINDINGS: Lateral view degraded by patient arm position. Midline trachea. Marked cardiomegaly. Mediastinal contours otherwise within normal limits. Small bilateral pleural effusions. Perihilar predominant interstitial and airspace disease. No lobar consolidation.  IMPRESSION:  Bilateral perihilar predominant interstitial and airspace disease. Favor pulmonary edema. Infection, including atypical s, and drug toxicity  could look similar.  Trace bilateral pleural effusions.   Electronically Signed   By: Abigail Miyamoto M.D.   On: 07/03/2014 17:40   Medications: I have reviewed the patient's current medications. Scheduled Meds: .  ceFAZolin (ANCEF) IV  1 g Intravenous 3 times per day  . collagenase   Topical TID  . DULoxetine  60 mg Oral Daily  . enoxaparin (LOVENOX) injection  40 mg Subcutaneous Q24H  . gabapentin  300 mg Oral QID  . insulin aspart  0-15 Units Subcutaneous TID WC  . insulin aspart  3 Units Subcutaneous TID WC  . insulin glargine  30 Units Subcutaneous QHS  . levothyroxine  100 mcg Oral QAC breakfast  . pantoprazole  40 mg Oral Daily  . polyethylene glycol  17 g Oral Daily  . senna-docusate  1 tablet Oral QHS  . sodium chloride  3 mL Intravenous Q12H  . zolpidem  5 mg Oral QHS   Continuous Infusions:   PRN Meds:.acetaminophen, diphenhydrAMINE, HYDROcodone-acetaminophen, HYDROmorphone (DILAUDID) injection, naphazoline-pheniramine, ondansetron, traMADol Assessment/Plan: Principal Problem:   Cellulitis and abscess Active Problems:   HYPOTHYROIDISM   DM type 2, uncontrolled, with severe neuropathy   Fibromyalgia syndrome   Essential hypertension, benign   Dysuria   Unspecified constipation   Lymphadenopathy   Sepsis   Hyperglycemia   Unspecified protein-calorie malnutrition   Liver function test abnormality   Acute renal failure superimposed on stage 3 chronic kidney disease   Hyponatremia   Leukocytosis   Antimitochondrial antibody positive  53 yo female with DM II, CKD, HTN here with left thigh abscess.  #Sepsis secondary to abscess of the medial proximal left thigh no abscess seen on CT pelvis initially, surgery I/Ded abscess. Remains afebrile. WBC trending down slowly. -previously on vanc/zosyn>>clinda>>now cefazolin. If she continues to improve, will do keflex or something else based on wound culture upon discharge.  - [place]. Wound cx NGTD.  -prn Norco 5-325 mg 2  tablets q6 prn pain -Continue ISS and ambulate to avoid atelectasis and PNA. - will try to wear off oxygen after transfusion. Likely anemia contributing to hypoxia. Continue [place] oxygen for now.  # Leukocytosis - normal WBC on may 2015. Admitted with WBC in 30.1. Was upto 43.9 but improving after abscess drainage.  - has mature neutrophils with toxic granulations likely 2/2 to infection. But she had myelocytes and promyelocytes which decreased on smear 07/05/14. - will see if WBC improves with above. If not, may be developing early CML. Will pursue CML w/up later including bcr-ABL if above fails.   #Acute on chronic normocytic anemia - baseline around 11, now 6.4 today.  -  etiology unclear. Differential includes include CKD, hemolysis, myeloma, thyroid disease? TSH high on 2014 and previously. Previous Anemia panel didn't show iron deficiency. Ferritin not drawn recently.  - retic count elevated, high haptoglobin, high LDH, negative coombs.  - today smear review with Dr. Beryle Beams: spherocytes, target cells. Will check g6pd and hemoglobin electropheresis.  #Transaminitis - chronically elevated alk phos since ~2013. AST/alt was elevated but Improving. Tbili remains up but trended down from admission. Hep panel negative. Iron normal. U/s abdomen normal. - Alk phos chronically high + AMA positive + no obstruction seen on ab u/s. 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  - Will need to be followed GI outpatient  #Acute on chronic CKD III: FENa  0.5% - prerenal. Increased to 2.35 but down overall. Baseline around 1.6. - continue to monitor.  #DM type 2, uncontrolled, with severe neuropathy with hyperglycemia: Hgb A1c 9.7.came in with bg 447. BG under 140's now. - lantus 40 at home. Lowered to 30 Units here with meal time coverage. -Will need outpatient follow up with But[facility] Plyler   #Essential hypertension, benign  -Currently normotensive.  -continue to hold home olmesartan as BP is low in 100's.  -monitor BP   #UTI: Insignificant growth on UCx, no further need for abx.  #Chest pain: likely MSK, resolved -Protonix 40 meq qd  #Unspecified constipation  -Senna-S qhs  -Miralax daily  #Hypothyroidism -Continue Synthyroid 100 mcg qd   #Fibromyalgia  -Continue Cymbalta 60 mg daily, Neurontin 300 mg qid  #Headache  -prn Tylenol 325 daily   #Nausea  -prn Zofran q6 hours   #Insomina  -continued home Ambien 5 mg qhs. Only uses as needed.   FEN: Continues to have hyponatremia but improving. -H/H carb modified diet   DVT ppx  -Lovenox, scds  Dispo: Disposition is deferred at this time, awaiting improvement of current medical problems.  Anticipated discharge in approximately 2 day(s).   The patient does have a current PCP Luan Moore, MD) and does need an [facility] hospital follow-up appointment after discharge.  The patient does not have transportation limitations that hinder transportation to clinic appointments.  .Services Needed at time of discharge: Y = Yes, Blank = No PT:   OT:   RN:   Equipment:   Other:     LOS: 8 days   Dellia Nims, MD 07/05/2014, 7:32 AM

## 2014-07-06 LAB — UIFE/LIGHT CHAINS/TP QN, 24-HR UR
Albumin, U: DETECTED
Alpha 1, Urine: DETECTED — AB
Alpha 2, Urine: DETECTED — AB
Beta, Urine: DETECTED — AB
Gamma Globulin, Urine: DETECTED — AB
Total Protein, Urine: 32 mg/dL — ABNORMAL HIGH (ref 5–24)

## 2014-07-06 LAB — BASIC METABOLIC PANEL
Anion gap: 10 (ref 5–15)
BUN: 7 mg/dL (ref 6–23)
CO2: 26 mEq/L (ref 19–32)
Calcium: 9.2 mg/dL (ref 8.4–10.5)
Chloride: 99 mEq/L (ref 96–112)
Creatinine, Ser: 1.07 mg/dL (ref 0.50–1.10)
GFR calc Af Amer: 68 mL/min — ABNORMAL LOW (ref >90)
GFR calc non Af Amer: 59 mL/min — ABNORMAL LOW (ref >90)
Glucose, Bld: 186 mg/dL — ABNORMAL HIGH (ref 70–99)
Potassium: 3.6 mEq/L — ABNORMAL LOW (ref 3.7–5.3)
Sodium: 135 mEq/L — ABNORMAL LOW (ref 137–147)

## 2014-07-06 LAB — ANAEROBIC CULTURE

## 2014-07-06 LAB — PROTEIN ELECTROPHORESIS, SERUM
Albumin ELP: 40.2 % — ABNORMAL LOW (ref 55.8–66.1)
Alpha-1-Globulin: 8.5 % — ABNORMAL HIGH (ref 2.9–4.9)
Alpha-2-Globulin: 14 % — ABNORMAL HIGH (ref 7.1–11.8)
Beta 2: 8.6 % — ABNORMAL HIGH (ref 3.2–6.5)
Beta Globulin: 5.4 % (ref 4.7–7.2)
Gamma Globulin: 23.3 % — ABNORMAL HIGH (ref 11.1–18.8)
M-Spike, %: NOT DETECTED g/dL
Total Protein ELP: 6.1 g/dL (ref 6.0–8.3)

## 2014-07-06 LAB — CBC
HCT: 27.7 % — ABNORMAL LOW (ref 36.0–46.0)
Hemoglobin: 9.7 g/dL — ABNORMAL LOW (ref 12.0–15.0)
MCH: 30.5 pg (ref 26.0–34.0)
MCHC: 35 g/dL (ref 30.0–36.0)
MCV: 87.1 fL (ref 78.0–100.0)
Platelets: 203 10*3/uL (ref 150–400)
RBC: 3.18 MIL/uL — ABNORMAL LOW (ref 3.87–5.11)
RDW: 16.5 % — ABNORMAL HIGH (ref 11.5–15.5)
WBC: 26 10*3/uL — ABNORMAL HIGH (ref 4.0–10.5)

## 2014-07-06 LAB — GLUCOSE, CAPILLARY
Glucose-Capillary: 152 mg/dL — ABNORMAL HIGH (ref 70–99)
Glucose-Capillary: 121 mg/dL — ABNORMAL HIGH (ref 70–99)

## 2014-07-06 LAB — T4, FREE: Free T4: 0.68 ng/dL — ABNORMAL LOW (ref 0.80–1.80)

## 2014-07-06 LAB — TSH: TSH: >100 u[IU]/mL — ABNORMAL HIGH (ref 0.350–4.500)

## 2014-07-06 MED ORDER — SENNOSIDES-DOCUSATE SODIUM 8.6-50 MG PO TABS: 1.0000 | ORAL_TABLET | Freq: Every day | ORAL | Status: DC

## 2014-07-06 MED ORDER — CEPHALEXIN 500 MG PO CAPS: 500.0000 mg | ORAL_CAPSULE | Freq: Two times a day (BID) | ORAL | Status: DC

## 2014-07-06 MED ORDER — HYDROCODONE-ACETAMINOPHEN 10-325 MG PO TABS: 1.0000 | ORAL_TABLET | Freq: Four times a day (QID) | ORAL | Status: DC | PRN

## 2014-07-06 MED ORDER — LEVOTHYROXINE SODIUM 150 MCG PO TABS: 150.0000 ug | ORAL_TABLET | Freq: Every day | ORAL | Status: DC

## 2014-07-06 MED ORDER — POLYETHYLENE GLYCOL 3350 17 G PO PACK: 17.0000 g | PACK | Freq: Every day | ORAL | Status: DC

## 2014-07-06 MED ORDER — COLLAGENASE 250 UNIT/GM EX OINT: TOPICAL_OINTMENT | Freq: Three times a day (TID) | CUTANEOUS | Status: DC

## 2014-07-06 MED ORDER — INSULIN GLARGINE 100 UNIT/ML ~~LOC~~ SOLN: 40.0000 [IU] | Freq: Every day | SUBCUTANEOUS | Status: DC

## 2014-07-06 NOTE — Progress Notes (Signed)
Physical Therapy Wound Treatment Patient Details  Name: Kayla [name] MRN: [id number] Date of Birth: 1961/05/17  Today's Date: 07/06/2014 Time: 1005-1101 Time Calculation (min): 56 min  Subjective  Subjective: More painful today due to multiple providers in to inspect wound prior to hydro Patient and Family Stated Goals: heal her wounds Date of Onset: 06/20/14 Prior Treatments: I&D, wet to dry dressings  Pain Score: 9/10 groin wounds despite pre-medication; treatment to pt's tolerance  Wound Assessment   Clinical Statement: Edema has decreased. Yellow necrotic tissue softening and more debrided this date (although there was underlying adherent necrotic tissue present). Deepest areas difficult to access for debridement. Pt with numerous questions re: wound care on d/c home (likely later today per pt). Questions answered; emphasized importance of packing to deepest point and pt understood. Pt to have [place] for dressing changes and further family education.   Wound / Incision (Open or Dehisced) 06/27/14 Lt anterior groin (Active)  Dressing Type ABD;Barrier Film (skin prep);Gauze (Comment);Moist to moist;Tape dressing;Transparent dressing 07/06/2014 11:13 AM  Dressing Changed Changed 07/06/2014 11:13 AM  Dressing Status Clean;Dry;Intact 07/06/2014 11:13 AM  Dressing Change Frequency Other (Comment) 07/05/2014  6:45 PM  Site / Wound Assessment Granulation tissue;Painful;Pale;Pink;Yellow 07/06/2014 11:13 AM  % Wound base Red or Granulating 30% 07/06/2014 11:13 AM  % Wound base Yellow 40% 07/06/2014 11:13 AM  % Wound base Black 0% 07/06/2014 11:13 AM  % Wound base Other (Comment) 30% 07/06/2014 11:13 AM  Peri-wound Assessment Edema;Erythema (non-blanchable);Induration 07/06/2014 11:13 AM  Wound Length (cm) 1.4 cm 07/04/2014  3:31 PM  Wound Width (cm) 12.4 cm 07/04/2014  3:31 PM  Wound Depth (cm) 3 cm 07/04/2014  3:31 PM  Undermining (cm) @ 2:00=5.4 07/04/2014  3:31 PM  Margins Unattached edges  (unapproximated) 07/06/2014 11:13 AM  Closure None 07/06/2014 11:13 AM  Drainage Amount Minimal 07/06/2014 11:13 AM  Drainage Description Serosanguineous 07/06/2014 11:13 AM  Non-staged Wound Description Full thickness 07/06/2014 11:13 AM  Treatment Debridement (Selective);Hydrotherapy (Pulse lavage) 07/06/2014 11:13 AM     Wound / Incision (Open or Dehisced) 06/27/14 Lt posterior groin (Active)  Dressing Type ABD;Barrier Film (skin prep);Gauze (Comment);Moist to moist;Tape dressing;Transparent dressing 07/06/2014 11:13 AM  Dressing Changed Changed 07/06/2014 11:13 AM  Dressing Status Clean;Dry;Intact 07/06/2014 11:13 AM  Dressing Change Frequency Other (Comment) 07/05/2014  6:45 PM  Site / Wound Assessment Granulation tissue;Pale;Yellow 07/06/2014 11:13 AM  % Wound base Red or Granulating 50% 07/06/2014 11:13 AM  % Wound base Yellow 50% 07/06/2014 11:13 AM  % Wound base Black 0% 07/06/2014 11:13 AM  % Wound base Other (Comment) 0% 07/06/2014 11:13 AM  Peri-wound Assessment Maceration;Induration 07/06/2014 11:13 AM  Wound Length (cm) 2.1 cm 07/04/2014  3:31 PM  Wound Width (cm) 11.8 cm 07/04/2014  3:31 PM  Wound Depth (cm) 1.8 cm 07/04/2014  3:31 PM  Undermining (cm) @ 3:00 2.7 cm 07/04/2014  3:31 PM  Margins Unattached edges (unapproximated) 07/06/2014 11:13 AM  Closure None 07/06/2014 11:13 AM  Drainage Amount Minimal 07/06/2014 11:13 AM  Drainage Description Serosanguineous 07/06/2014 11:13 AM  Non-staged Wound Description Full thickness 07/06/2014 11:13 AM  Treatment Debridement (Selective);Hydrotherapy (Pulse lavage) 07/06/2014 11:13 AM   Hydrotherapy Pulsed lavage therapy - wound location: Lt groin wounds Pulsed Lavage with Suction (psi): 4 psi (to 8) Pulsed Lavage with Suction - Normal Saline Used: 1000 mL Pulsed Lavage Tip: Tip with splash shield Selective Debridement Selective Debridement - Location: Lt groin wounds Selective Debridement - Tools Used: Forceps;Scissors;Scalpel Selective  Debridement - Tissue Removed: yellow  slough   Wound Assessment and Plan  Wound Therapy - Assess/Plan/Recommendations Wound Therapy - Clinical Statement: Edema has decreased. Yellow necrotic tissue softening and more debrided this date (although there was underlying adherent necrotic tissue present). Deepest areas difficult to access for debridement. Pt with numerous questions re: wound care on d/c home (likely later today per pt). Questions answered; emphasized importance of packing to deepest point and pt understood. Pt to have [facility] for dressing changes and further family education.  Wound Therapy - Functional Problem List: impairs all mobility due to pain Factors Delaying/Impairing Wound Healing: Diabetes Mellitus;Infection - systemic/local;Immobility;Multiple medical problems Hydrotherapy Plan: Debridement;Dressing change;Patient/family education;Pulsatile lavage with suction Wound Therapy - Frequency: 6X / week Wound Therapy - Follow Up Recommendations: Home health RN Wound Plan: see above  Wound Therapy Goals- Improve the function of patient's integumentary system by progressing the wound(s) through the phases of wound healing (inflammation - proliferation - remodeling) by: Decrease Necrotic Tissue to: 40% Decrease Necrotic Tissue - Progress: Progressing toward goal Increase Granulation Tissue to: 60% Increase Granulation Tissue - Progress: Progressing toward goal Improve Drainage Characteristics: Min;Serous Patient/Family will be able to : verbalize plans for wound care on discharge   Goals will be updated until maximal potential achieved or discharge criteria met.  Discharge criteria: when goals achieved, discharge from hospital, MD decision/surgical intervention, no progress towards goals, refusal/missing three consecutive treatments without notification or medical reason.  GP     , 07/06/2014, 11:18 AM Pager [phone]    

## 2014-07-06 NOTE — Progress Notes (Signed)
Blood completed. Patient denies SOB, chills, fever, and n/v. CBC has been ordered- will continue to monitor

## 2014-07-06 NOTE — Progress Notes (Signed)
Nsg Discharge Note  Admit Date:  06/27/2014 Discharge date: 07/06/2014   Kayla [name] to be D/C'd Home per MD order.  AVS completed.  Copy for chart, and copy for patient signed, and dated. Patient/caregiver able to verbalize understanding.  Discharge Medication:   Medication List         ACCU-CHEK FASTCLIX LANCETS Misc  1 each by Does not apply route 4 (four) times daily -  before meals and at bedtime.     ACCU-CHEK NANO SMARTVIEW W/DEVICE Kit  1 each by Does not apply route 4 (four) times daily -  before meals and at bedtime.     ACCU-CHEK AVIVA PLUS W/DEVICE Kit  1 kit by Does not apply route as directed.     cephALEXin 500 MG capsule  Commonly known as:  KEFLEX  Take 1 capsule (500 mg total) by mouth 2 (two) times daily.     collagenase ointment  Commonly known as:  SANTYL  Apply topically 3 (three) times daily.     DULoxetine 30 MG capsule  Commonly known as:  CYMBALTA  Take 2 capsules (60 mg total) by mouth daily.     gabapentin 300 MG capsule  Commonly known as:  NEURONTIN  Take 2 capsules (600 mg total) by mouth 4 (four) times daily.     guaiFENesin-dextromethorphan 100-10 MG/5ML syrup  Commonly known as:  ROBITUSSIN DM  Take 5 mLs by mouth every 4 (four) hours as needed for cough.     HYDROcodone-acetaminophen 10-325 MG per tablet  Commonly known as:  NORCO  Take 1 tablet by mouth every 6 (six) hours as needed.     insulin aspart 100 UNIT/ML injection  Commonly known as:  NOVOLOG FLEXPEN  Inject 3-5 Units into the skin 3 (three) times daily before meals.     insulin glargine 100 UNIT/ML injection  Commonly known as:  LANTUS  Inject 0.4 mLs (40 Units total) into the skin daily.     levothyroxine 150 MCG tablet  Commonly known as:  SYNTHROID, LEVOTHROID  Take 1 tablet (150 mcg total) by mouth daily before breakfast.     olmesartan 40 MG tablet  Commonly known as:  BENICAR  Take 40 mg by mouth daily as needed. Patient only takes if she feels her blood  pressure is high, will have daughter check if over 130 patient will take medication.     polyethylene glycol packet  Commonly known as:  MIRALAX / GLYCOLAX  Take 17 g by mouth daily.     senna-docusate 8.6-50 MG per tablet  Commonly known as:  Senokot-S  Take 1 tablet by mouth at bedtime.     zolpidem 5 MG tablet  Commonly known as:  AMBIEN  Take 5 mg by mouth at bedtime as needed for sleep.        Discharge Assessment: Filed Vitals:   07/06/14 1300  BP: 143/80  Pulse: 81  Temp: 98.2 F (36.8 C)  Resp: 18   Skin- Wound treatment to Left inner thigh completed before discharge (May refer to [facility]). Pt. Has been educated about wound, Home Health care to follow up.  IV catheter discontinued intact. Site without signs and symptoms of complications - no redness or edema noted at insertion site, patient denies c/o pain - only slight tenderness at site.  Dressing with slight pressure applied.  D/c Instructions-Education: Discharge instructions given to patient/family with verbalized understanding. D/c education completed with patient/family including follow up instructions, medication list, d/c activities limitations if indicated,  with other d/c instructions as indicated by MD - patient able to verbalize understanding, all questions fully answered. Patient instructed to return to ED, call 911, or call MD for any changes in condition.  Patient escorted via [place], and D/C home via private auto.  Dayle [place], RN 07/06/2014 4:48 PM

## 2014-07-06 NOTE — Progress Notes (Signed)
Patient ID: Kayla [name], female   DOB: 12/01/1960, 53 y.o.   MRN: [id number] Medical attending discharge note: I personally interviewed and examined this patient on the day of discharge. I concur with the evaluation and discharge plans as recorded by resident physician Dr. Dellia Nims.  Summary: 53 year old woman with previous history of Graves' disease now hypothyroid on replacement, type 2 diabetes now insulin-dependent, hypertensive on Benicar, who presented on the day of admission 06/27/2014 with pain and soft tissue swelling medial, proximal, left thigh with associated low-grade fevers, chills, and headache. On initial exam temperature 99 degrees. There was an area of soft tissue swelling and tenderness over a 10 x 6 cm area proximal, medial, left thigh. Initial white blood count 30,000 with 90% neutrophils, hemoglobin 10.9, MCV 86, platelet count 212,000. A CT scan showed a large area of inflammation but no initial suspicion for abscess formation. She was started on broad-spectrum parenteral antibiotics for a diagnosis of soft tissue cellulitis initially with vancomycin and Zosyn. Due to a persistent and progressive rise in her white blood count up to as high as 43,000, and a toxic-appearing peripheral blood film with the presence of early myeloid and erythroid cells, we were concerned that she did in fact have an abscess. Despite a followup CT scan which was unchanged from admission, we obtained a surgical consultation who agreed that this lady had a deep abscess. She was taken to the operating room on October 2 and underwent incision and drainage of what turned out to be a large, complex, left thigh abscess involving an area of about 14 cm. Rare white cells seen on the aspirated fluid. Low grade group C. streptococcal organisms grown from aerobic cultures. Anaerobic cultures were negative. No bacterial organisms on Gram stain. Following incision and drainage, there was a slow but steady fall in  her white blood count down to discharge value of 26,000 on October 7. She was initially changed to clindamycin prior to the surgery and then changed to cefazolin. She will be discharged on oral cephalexin to complete a 14 day course.  Additional problems identified during this admission included: #1. Progressive normochromic anemia. There was some component of acute on chronic renal insufficiency with serum creatinines recorded as high as 2.35. Other component from acute infection with bone marrow suppression and uncontrolled hypothyroidism. However, fall in  hemoglobin appeared disproportionate to her clinical situation. Hemoglobin recorded on May 29 was  11.6. White count normal at that time 8200. On admission hemoglobin 10.9. Hemoglobin slowly fell as low as 6.9. Additional studies done at that time showed a reticulocytosis of 8%, LDH mildly elevated at 296 but likely spurious secondary to recent surgical procedure, haptoglobin high normal at 204. Review of the peripheral blood film on multiple occasions showed polychromasia, 1+ spherocytosis, 2+ target cells, early myeloid cells including myelocytes and metamyelocytes, and a rare nucleated red blood cell. Coombs test was negative. Additional testing to exclude possibility of nonimmune hemolysis done including a G6PD analysis and a hemoglobin electrophoresis. Results pending at time of this dictation. Risks and benefits of transfusion were discussed with the patient in detail and she agreed to blood transfusion. She received 2 units of packed cells on October 6. Hemoglobin at discharge was 9.7.  #2. Acute on chronic liver function abnormalities Liver function abnormalities first noted in November 2013 with bilirubin 1.8, SGOT 163, SGPT 151, alkaline phosphatase 208. Acute hepatitis profile negative at that time. On admission 06/27/2014, total bilirubin 4.8, SGOT 30, SGPT 26, alkaline phosphatase 311.  Indirect bilirubin 2.5. SGOT peaked at 117 on September  30, SGOT peaked at 80 on the same day, both normalized by discharge. Bilirubin slowly fell down to 1.4 by October 5. Alkaline phosphatase slowly elevated to 384 on October 5. Ultrasound of the abdomen did not show any biliary obstruction or focal parenchymal liver abnormalities. Of note, antimitochondrial antibodies were elevated suggesting that she might have a component of autoimmune liver disease. Further evaluation deferred to the outpatient setting.  #3. Hypothyroidism. It does not appear that the patient was taking her thyroid medication as an outpatient. She was started back on a dose of 100 mcg of Synthroid on September 29. She does have proptosis. Unexpectedly, TSH done on the day of discharge October 7 was greater than 100. Most recent value recorded was from November 2014 when TSH was 15.9. She appeared euthyroid. Her only complaint was fatigue. We elected to increase her Synthroid dose to 0.17 mcg per kilogram at time of discharge. She has close followup in our internal medicine clinic and will be reevaluated again in one week.  Murriel Hopper, MD, [place]  Hematology-Oncology/Internal Medicine

## 2014-07-06 NOTE — Progress Notes (Signed)
Subjective:  Feeling more tired today. Off oxygen. Had transfusion without problem yesterday. Wants to go home. Denies any sob, cp, cough, n/v, fever, chills.  Objective: Vital signs in last 24 hours: Filed Vitals:   07/06/14 0100 07/06/14 0130 07/06/14 0407 07/06/14 0500  BP: 155/70 131/87 148/75   Pulse: 78 78 80   Temp: 98.8 F (37.1 C) 98.9 F (37.2 C) 99 F (37.2 C)   TempSrc: Oral Oral Oral   Resp: '18 18 20   ' Height:      Weight:    87.771 kg (193 lb 8 oz)  SpO2: 100% 100% 100%    Weight change: -0.229 kg (-8.1 oz)  Intake/Output Summary (Last 24 hours) at 07/06/14 0941 Last data filed at 07/06/14 0114  Gross per 24 hour  Intake    180 ml  Output    650 ml  Net   -470 ml   General: NAD HEENT: [place]/at, anicteric sclera  Cardiac: RRR, no rubs, murmurs or gallops  Pulm: clear to auscultation bilaterally, no wheezes, rales, or rhonchi  Abd: soft, NT, nondistended, BS present  Ext: warm and well perfused, no pedal edema, left thigh abscess site covered with dressing. Some TTP Neuro: alert and oriented X3, cranial nerves II-XII grossly intact. 4/5 strength to extension on right arm.   Lab Results: Basic Metabolic Panel:  Recent Labs Lab 07/04/14 1414 07/06/14 0543  NA 136* 135*  K 3.7 3.6*  CL 102 99  CO2 25 26  GLUCOSE 111* 186*  BUN 8 7  CREATININE 1.21* 1.07  CALCIUM 9.5 9.2   Liver Function Tests:  Recent Labs Lab 07/02/14 0615 07/04/14 1414  AST 36 30  ALT 38* 14  ALKPHOS 335* 384*  BILITOT 2.1* 1.4*  PROT 6.7 7.1  ALBUMIN 1.9* 2.1*   CBC:  Recent Labs Lab 07/02/14 0615  07/05/14 0615 07/06/14 0543  WBC 37.4*  < > 32.7* 26.0*  NEUTROABS 31.8*  --  26.1*  --   HGB 7.4*  < > 6.4* 9.7*  HCT 20.5*  < > 18.6* 27.7*  MCV 86.9  < > 89.0 87.1  PLT PLATELET CLUMPS NOTED ON SMEAR, COUNT APPEARS ADEQUATE  < > 224 203  < > = values in this interval not displayed. Cardiac Enzymes: No results found for this basename: CKTOTAL, CKMB, CKMBINDEX,  TROPONINI,  in the last 168 hours CBG:  Recent Labs Lab 07/04/14 2140 07/05/14 0742 07/05/14 1210 07/05/14 1652 07/05/14 2111 07/06/14 0739  GLUCAP 140* 109* 120* 123* 97 152*   Hemoglobin A1C: No results found for this basename: HGBA1C,  in the last 168 hours Urinalysis: No results found for this basename: COLORURINE, APPERANCEUR, LABSPEC, PHURINE, GLUCOSEU, HGBUR, BILIRUBINUR, KETONESUR, PROTEINUR, UROBILINOGEN, NITRITE, LEUKOCYTESUR,  in the last 168 hours  Micro Results: Recent Results (from the past 240 hour(s))  CULTURE, BLOOD (ROUTINE X 2)     Status: None   Collection Time    06/27/14  4:55 PM      Result Value Ref Range Status   Specimen Description BLOOD RIGHT ARM   Final   Special Requests BOTTLES DRAWN AEROBIC AND ANAEROBIC 5CC   Final   Culture  Setup Time     Final   Value: 06/27/2014 22:09     Performed at [facility]   Culture     Final   Value: NO GROWTH 5 DAYS     Performed at [facility]   Report Status 07/03/2014 FINAL   Final  CULTURE, BLOOD (ROUTINE X 2)     Status: None   Collection Time    06/27/14  9:06 PM      Result Value Ref Range Status   Specimen Description BLOOD LEFT WRIST   Final   Special Requests BOTTLES DRAWN AEROBIC ONLY 5CC   Final   Culture  Setup Time     Final   Value: 06/28/2014 00:26     Performed at [facility]   Culture     Final   Value: NO GROWTH 5 DAYS     Performed at [facility]   Report Status 07/04/2014 FINAL   Final  URINE CULTURE     Status: None   Collection Time    06/27/14 10:34 PM      Result Value Ref Range Status   Specimen Description URINE, CLEAN CATCH   Final   Special Requests NONE   Final   Culture  Setup Time     Final   Value: 06/27/2014 23:11     Performed at [place]     Final   Value: 2,000 COLONIES/ML     Performed at [facility]   Culture     Final   Value: INSIGNIFICANT GROWTH     Performed at [facility]     Report Status 06/28/2014 FINAL   Final  ANAEROBIC CULTURE     Status: None   Collection Time    07/01/14  3:09 PM      Result Value Ref Range Status   Specimen Description ABSCESS THIGH LEFT   Final   Special Requests PT ON ANCEF   Final   Gram Stain     Final   Value: FEW WBC PRESENT,BOTH PMN AND MONONUCLEAR     NO SQUAMOUS EPITHELIAL CELLS SEEN     NO ORGANISMS SEEN     Performed at [facility]   Culture     Final   Value: NO ANAEROBES ISOLATED; CULTURE IN PROGRESS FOR 5 DAYS     Performed at [facility]   Report Status PENDING   Incomplete  CULTURE, ROUTINE-ABSCESS     Status: None   Collection Time    07/01/14  3:09 PM      Result Value Ref Range Status   Specimen Description ABSCESS THIGH LEFT   Final   Special Requests PT ON ANCEF   Final   Gram Stain     Final   Value: FEW WBC PRESENT,BOTH PMN AND MONONUCLEAR     NO SQUAMOUS EPITHELIAL CELLS SEEN     NO ORGANISMS SEEN     Performed at [facility]   Culture     Final   Value: RARE STREPTOCOCCUS GROUP C     Performed at [facility]   Report Status 07/05/2014 FINAL   Final   Studies/Results: No results found. Medications: I have reviewed the patient's current medications. Scheduled Meds: .  ceFAZolin (ANCEF) IV  1 g Intravenous 3 times per day  . collagenase   Topical TID  . DULoxetine  60 mg Oral Daily  . enoxaparin (LOVENOX) injection  40 mg Subcutaneous Q24H  . gabapentin  300 mg Oral QID  . insulin aspart  0-15 Units Subcutaneous TID WC  . insulin aspart  3 Units Subcutaneous TID WC  . insulin glargine  30 Units Subcutaneous QHS  . levothyroxine  100 mcg Oral QAC breakfast  . pantoprazole  40 mg Oral Daily  . polyethylene glycol  17 g Oral Daily  . senna-docusate  1 tablet Oral QHS  . sodium chloride  3 mL Intravenous Q12H  . zolpidem  5 mg Oral QHS   Continuous Infusions:   PRN Meds:.acetaminophen, diphenhydrAMINE, HYDROcodone-acetaminophen, HYDROmorphone (DILAUDID)  injection, naphazoline-pheniramine, ondansetron, traMADol Assessment/Plan: Principal Problem:   Cellulitis and abscess Active Problems:   HYPOTHYROIDISM   DM type 2, uncontrolled, with severe neuropathy   Fibromyalgia syndrome   Essential hypertension, benign   Unspecified constipation   Unspecified protein-calorie malnutrition   Liver function test abnormality   Acute renal failure superimposed on stage 3 chronic kidney disease   Hyponatremia   Leukocytosis   Antimitochondrial antibody positive  53 yo female with DM II, CKD, HTN here with left thigh abscess.  #Sepsis secondary to abscess of the medial proximal left thigh no abscess seen on CT pelvis initially, WBC was elevated and went upto ~43.9K. , surgery I/Ded abscess. Remains afebrile. WBC trending down.  -previously on vanc/zosyn>>clinda>>now cefazolin. Will send home with keflex for 10 days. - [place]. Wound cx NGTD.  -prn Norco 5-325 mg 2 tablets q6 prn pain -Continue ISS and ambulate to avoid atelectasis. Had trace b/l pleural effusion on 07/03/14 cxr. Will repeat one to make sure it cleared. - weaned off oxygen after transfusion. - needs wound care at home, [facility] ordered. Will also arrange surgery clinic follow up.  #Acute on chronic normocytic anemia - baseline around 11, now 6.4 today.  -  etiology unclear. Differential includes include CKD, hemolysis, myeloma, thyroid disease? TSH high on 2014 and previously. No iron deficiency on anemia panel.  - retic count elevated, high haptoglobin, high LDH, negative coombs.  - 07/05/14 smear review with Dr. Beryle Beams: spherocytes, target cells.  -  Checked g6pd and hemoglobin electropheresis. Pending.  # Leukocytosis - normal WBC on may 2015. Admitted with WBC in 30.1. Was upto 43.9 but improving after abscess drainage.  - has mature neutrophils with toxic granulations likely 2/2 to infection. But she had myelocytes and promyelocytes which decreased on smear  07/05/14.  #Transaminitis - chronically elevated alk phos since ~2013. AST/alt was elevated but Improving. Tbili was up in ~4's but trended down. Hep panel negative. Iron normal. U/s abdomen normal. - Alk phos chronically high + AMA positive + no obstruction seen on ab u/s. 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  - Will need to be followed GI outpatient  #Acute on chronic CKD III: FENa 0.5% - prerenal. Increased to 2.35 but now normalized to better than baseline. Baseline around 1.6. - continue to monitor.  #DM type 2, uncontrolled, with severe neuropathy with hyperglycemia: Hgb A1c 9.7.came in with bg 447. BG under 140's now. - lantus 40 at home. Lowered to 30 Units here with meal time coverage. -Will need outpatient follow up with [facility]   #Essential hypertension, benign  -held olmesartan as BP was low but now came up so will restart on discharge. -monitor BP   #UTI: Insignificant growth on UCx, no further need for abx.  #Chest pain: likely MSK, resolved -Protonix 40 meq qd  #Unspecified constipation  -Senna-S qhs  -Miralax daily  #Hypothyroidism -Continue Synthyroid 100 mcg qd   #Fibromyalgia  -Continue Cymbalta 60 mg daily, Neurontin 300 mg qid  #Headache  -prn Tylenol 325 daily   #Nausea  -prn Zofran q6 hours   #Insomina  -continued home Ambien 5 mg qhs. Only uses as needed.  FEN: -H/H carb modified diet   DVT ppx  -Lovenox, scds  Dispo: Disposition is deferred at this time, awaiting improvement of current medical problems.  Anticipated discharge in approximately 2 day(s).   The patient does have a current PCP Luan Moore, MD) and does need an [facility] hospital follow-up appointment after discharge.  The patient does not have transportation limitations that hinder transportation to clinic appointments.  .Services Needed at time of discharge: Y = Yes, Blank = No PT:   OT:   RN:   Equipment:   Other:      LOS: 9 days   Dellia Nims, MD 07/06/2014, 9:41 AM

## 2014-07-06 NOTE — Progress Notes (Signed)
[facility] Progress Note  5 Days Post-Op  Subjective: Doing well.  Wound improving well.  Not much pain.  Ambulating well.  WBC trending down significantly.  Objective: Vital signs in last 24 hours: Temp:  [98.2 F (36.8 C)-99.3 F (37.4 C)] 99 F (37.2 C) (10/07 0407) Pulse Rate:  [71-85] 80 (10/07 0407) Resp:  [16-20] 20 (10/07 0407) BP: (127-155)/(65-87) 148/75 mmHg (10/07 0407) SpO2:  [97 %-100 %] 100 % (10/07 0407) Weight:  [193 lb 8 oz (87.771 kg)] 193 lb 8 oz (87.771 kg) (10/07 0500) Last BM Date: 07/04/14  Intake/Output from previous day: 10/06 0701 - 10/07 0700 In: 180 [P.O.:120; Blood:60] Out: 650 [Urine:650] Intake/Output this shift:    PE: Gen:  Alert, NAD, pleasant Skin: warm and dry, 6cm incision at superior aspect of left thigh wound which is 2cm deep, at wound depth is 80% superficial slough. Inferior groin wound is 8cm in length and more superficial, most proximal part is 1cm deep   Lab Results:   Recent Labs  07/05/14 0615 07/06/14 0543  WBC 32.7* 26.0*  HGB 6.4* 9.7*  HCT 18.6* 27.7*  PLT 224 203   BMET  Recent Labs  07/04/14 1414 07/06/14 0543  NA 136* 135*  K 3.7 3.6*  CL 102 99  CO2 25 26  GLUCOSE 111* 186*  BUN 8 7  CREATININE 1.21* 1.07  CALCIUM 9.5 9.2   PT/INR No results found for this basename: LABPROT, INR,  in the last 72 hours CMP     Component Value Date/Time   NA 135* 07/06/2014 0543   K 3.6* 07/06/2014 0543   CL 99 07/06/2014 0543   CO2 26 07/06/2014 0543   GLUCOSE 186* 07/06/2014 0543   BUN 7 07/06/2014 0543   CREATININE 1.07 07/06/2014 0543   CREATININE 1.13* 08/24/2012 1604   CALCIUM 9.2 07/06/2014 0543   PROT 7.1 07/04/2014 1414   ALBUMIN 2.1* 07/04/2014 1414   AST 30 07/04/2014 1414   ALT 14 07/04/2014 1414   ALKPHOS 384* 07/04/2014 1414   BILITOT 1.4* 07/04/2014 1414   GFRNONAA 59* 07/06/2014 0543   GFRNONAA 57* 08/24/2012 1604   GFRAA 68* 07/06/2014 0543   GFRAA 65 08/24/2012 1604   Lipase      Component Value Date/Time   LIPASE 67* 08/17/2012 2108       Studies/Results: No results found.  Anti-infectives: Anti-infectives   Start     Dose/Rate Route Frequency Ordered Stop   07/01/14 1400  ceFAZolin (ANCEF) IVPB 1 g/50 mL premix     1 g 100 mL/hr over 30 Minutes Intravenous 3 times per day 07/01/14 1242     06/29/14 1200  clindamycin (CLEOCIN) capsule 300 mg  Status:  Discontinued     300 mg Oral 4 times per day 06/29/14 1040 07/01/14 1242   06/28/14 0600  vancomycin (VANCOCIN) IVPB 750 mg/150 ml premix  Status:  Discontinued     750 mg 150 mL/hr over 60 Minutes Intravenous Every 12 hours 06/27/14 1850 06/29/14 1040   06/27/14 2300  piperacillin-tazobactam (ZOSYN) IVPB 3.375 g  Status:  Discontinued     3.375 g 12.5 mL/hr over 240 Minutes Intravenous 3 times per day 06/27/14 1716 06/29/14 1040   06/27/14 1800  vancomycin (VANCOCIN) 1,500 mg in sodium chloride 0.9 % 500 mL IVPB     1,500 mg 250 mL/hr over 120 Minutes Intravenous  Once 06/27/14 1716 06/27/14 2012   06/27/14 1730  piperacillin-tazobactam (ZOSYN) IVPB 3.375 g  3.375 g 100 mL/hr over 30 Minutes Intravenous  Once 06/27/14 1716 06/27/14 1842       Assessment/Plan Left thigh abscess/cellulitis/mass  Leukocytosis - 26.0 Type 2 diabetes insulin dependent  Hgb 6.9   Plan:  1. IM service discharging the patient home today.   2. Dressing changes TID WD, hydrotherapy to d/c at discharge, will need BID dressing changes to wound upon discharge home (needs [facility] nursing to help teach dressing changes)  3. Encourage ambulating  4. No further areas to surgically debride.  5. Follow up with [place] in the office in 3 weeks for wound check, appt in d/c section     LOS: 9 days    Kayla [name], [place] 07/06/2014, 10:16 AM Pager: [phone]

## 2014-07-06 NOTE — Discharge Instructions (Addendum)
You were treated here for a left thigh abscess that was drained. Home nursing will come and help you take care of the wound. You should follow up with surgery clinic. They will call you for appointment.   You should also follow up with Dr. Alice Rieger next week.  You can ask the clinic to change your primary care doctor to me (Dr. Genene Churn) if you wish. I would love to take care of you as your primary provider.  Take antibiotics for 7 more days.  We gave you some pain medication (Norco), use it only if you have severe pain. Otherwise just take tylenol. Take stool softeners with the pain medicine to avoid constipation.  Dressing Change A dressing is a material placed over wounds. It keeps the wound clean, dry, and protected from further injury. This provides an environment that favors wound healing.  BEFORE YOU BEGIN  Get your supplies together. Things you may need include:  Saline solution.  Flexible gauze dressing.  Medicated cream.  Tape.  Gloves.  Abdominal dressing pads.  Gauze squares.  Plastic bags.  Take pain medicine 30 minutes before the dressing change if you need it.  Take a shower before you do the first dressing change of the day. Use plastic wrap or a plastic bag to prevent the dressing from getting wet. REMOVING YOUR OLD DRESSING   Wash your hands with soap and water. Dry your hands with a clean towel.  Put on your gloves.  Remove any tape.  Carefully remove the old dressing. If the dressing sticks, you may dampen it with warm water to loosen it, or follow your caregiver's specific directions.  Remove any gauze or packing tape that is in your wound.  Take off your gloves.  Put the gloves, tape, gauze, or any packing tape into a plastic bag. CHANGING YOUR DRESSING  Open the supplies.  Take the cap off the saline solution.  Open the gauze package so that the gauze remains on the inside of the package.  Put on your gloves.  Clean your wound as told by  your caregiver.  If you have been told to keep your wound dry, follow those instructions.  Your caregiver may tell you to do one or more of the following:  Pick up the gauze. Pour the saline solution over the gauze. Squeeze out the extra saline solution.  Put medicated cream or other medicine on your wound if you have been told to do so.  Put the solution soaked gauze only in your wound, not on the skin around it.  Pack your wound loosely or as told by your caregiver.  Put dry gauze on your wound.  Put abdominal dressing pads over the dry gauze if your wet gauze soaks through.  Tape the abdominal dressing pads in place so they will not fall off. Do not wrap the tape completely around the affected part (arm, leg, abdomen).  Wrap the dressing pads with a flexible gauze dressing to secure it in place.  Take off your gloves. Put them in the plastic bag with the old dressing. Tie the bag shut and throw it away.  Keep the dressing clean and dry until your next dressing change.  Wash your hands. SEEK MEDICAL CARE IF:  Your skin around the wound looks red.  Your wound feels more tender or sore.  You see pus in the wound.  Your wound smells bad.  You have a fever.  Your skin around the wound has a rash that  itches and burns.  You see black or yellow skin in your wound that was not there before.  You feel nauseous, throw up, and feel very tired. Document Released: 10/24/2004 Document Revised: 12/09/2011 Document Reviewed: 07/29/2011 [facility] Patient Information 2015 , [place]. This information is not intended to replace advice given to you by your health care provider. Make sure you discuss any questions you have with your health care provider.

## 2014-07-07 LAB — HEMOGLOBINOPATHY EVALUATION
Hemoglobin Other: 0 %
Hgb A2 Quant: 2.8 % (ref 2.2–3.2)
Hgb A: 56.4 % — ABNORMAL LOW (ref 96.8–97.8)
Hgb F Quant: 0.5 % (ref 0.0–2.0)
Hgb S Quant: 40.3 % — ABNORMAL HIGH

## 2014-07-07 LAB — TYPE AND SCREEN
ABO/RH(D): O POS
Antibody Screen: NEGATIVE
Unit division: 0
Unit division: 0

## 2014-07-12 LAB — GLUCOSE 6 PHOSPHATE DEHYDROGENASE: G6PDH: 17.8 U/g Hgb (ref 7.0–20.5)

## 2014-07-14 ENCOUNTER — Ambulatory Visit: Payer: Self-pay | Admitting: Internal Medicine

## 2014-07-14 ENCOUNTER — Encounter: Payer: Self-pay | Admitting: Internal Medicine

## 2014-07-18 ENCOUNTER — Telehealth: Payer: Self-pay | Admitting: *Deleted

## 2014-07-18 ENCOUNTER — Telehealth (INDEPENDENT_AMBULATORY_CARE_PROVIDER_SITE_OTHER): Payer: Self-pay

## 2014-07-18 NOTE — Telephone Encounter (Signed)
Call from [place], [place] with [facility] # [phone]  Nurse called for 2 reasons :  1. She saw pt today for wound care and pt is c/o new bumps on rt labia, similar to bumps that she had on her Thigh.                                                  2. She is asking for a Education officer, museum consult as she does not have medicaid and needs assistance with medication.  Pt has a schedule appointment 10/22 - nurse wanted you to look at above during that visit.  Also will you order the social worker consult.

## 2014-07-18 NOTE — Telephone Encounter (Signed)
Pt is s/p I&D thigh abscess on 07/01/14 by Dr. Grandville Silos.  Home health nurse is calling to update pt's progress and states the pt is still having pain at the site, and not just when the dressing is changed.  There is a hard/swollen area above the wound that is sore to touch, no redness.  No fever or chills.  Bowels moving.  Please advise.

## 2014-07-19 NOTE — Telephone Encounter (Signed)
Likely is OK. Please make sure she has a F/U appointment within the next week or so.

## 2014-07-19 NOTE — Telephone Encounter (Signed)
Pt has appt next Tuesday 10-27 with the DOW PA.

## 2014-07-21 ENCOUNTER — Ambulatory Visit (INDEPENDENT_AMBULATORY_CARE_PROVIDER_SITE_OTHER): Payer: Self-pay | Admitting: Internal Medicine

## 2014-07-21 ENCOUNTER — Encounter: Payer: Self-pay | Admitting: Internal Medicine

## 2014-07-21 VITALS — BP 154/61 | HR 87 | Temp 98.9°F | Ht 62.0 in | Wt 174.3 lb

## 2014-07-21 DIAGNOSIS — L0390 Cellulitis, unspecified: Secondary | ICD-10-CM

## 2014-07-21 DIAGNOSIS — D649 Anemia, unspecified: Secondary | ICD-10-CM

## 2014-07-21 DIAGNOSIS — L0291 Cutaneous abscess, unspecified: Secondary | ICD-10-CM

## 2014-07-21 MED ORDER — HYDROCODONE-ACETAMINOPHEN 10-325 MG PO TABS: 1.0000 | ORAL_TABLET | Freq: Four times a day (QID) | ORAL | Status: DC | PRN

## 2014-07-21 NOTE — Progress Notes (Signed)
Patient ID: Quadirah Holden, female   DOB: 1961/02/27, 53 y.o.   MRN: [id number]   Subjective:   HPI: Ms.Aracelli Cardiff is a 53 y.o. woman with past medical history of DM2 c/b neuropathy, fibromyalgia presents for hospital followup visit.    She was discharged from the hospital on 07/06/2014 after 9 days of treatment for cellulitis and abscess of the left thigh. She was also found to have acute on chronic normocytic anemia, which required transfusions. Her hemoglobin electrophoresis revealed sickle cell trait. Since she has been home, she reports, that she's been feeling otherwise well. She continues to have twice a day dressing of her wound. She denies fevers, chills, or increased fatigue. She denies shortness of breath, or dizziness, or chest pain. She presents with her CNA who has been assisting with the wound dressing. The CNA reports that she's been having increasing drainage from the wound, but mostly clear fluid on the dressing without pass drainage. Patient has a followup appointment with Gen. surgery in 7 days. She completed her Keflex antibiotics. The main concern is ongoing pain, especially around the time of wound dressing. She has been counseled that she can take her Vicodin at least an hour before the dressing session.   ROS: Constitutional: Denies fever, chills, diaphoresis, appetite change and fatigue.  Respiratory: Denies SOB, DOE, cough, chest tightness, and wheezing. Denies chest pain. CVS: No chest pain, palpitations and leg swelling.  GI: No abdominal pain, nausea, vomiting, bloody stools GU: No dysuria, frequency, hematuria, or flank pain.  MSK: No myalgias, back pain, joint swelling, arthralgias  Psych: No depression symptoms. No SI or SA.    Objective:  Physical Exam: Filed Vitals:   07/21/14 1441  BP: 154/61  Pulse: 87  Temp: 98.9 F (37.2 C)  TempSrc: Oral  Height: 5\' 2"  (1.575 m)  Weight: 174 lb 4.8 oz (79.062 kg)  SpO2: 100%   General: Well nourished. No acute  distress.  HEENT: Normal oral mucosa. MMM.  Lungs: CTA bilaterally. Heart: RRR; no extra sounds or murmurs  Abdomen: Non-distended, normal bowel sounds, soft, nontender; no hepatosplenomegaly  Extremities: No pedal edema. No joint swelling or tenderness. Left medial thigh wound appears clean without any ordour. It is draining small amount of serous fluid. The base of the wound looks otherwise pink without active bleeding. Surrounding area, without erythema but there is minimal tenderness. The wound has 2 separate parts one is more posterior and another one more anterior. The 2 are not connected. The more posterior portion of the wound measures about 5 cm in length and is a more a superficial wound. The anterior portion of the wound is a deeper wound with a depth of about 3-4 cm, width of about 1.5 cm in length and about 5 cm. The isthmus between the 2 wounds has a firm feeling without fluctuance. They have both packed with gauze. Some small areas of small necrotic skin, but otherwise the entire right of the wound looks healthy and clean. Neurologic: Normal EOM,  Alert and oriented x3. No obvious neurologic/cranial nerve deficits.  Assessment & Plan:  Discussed case with my attending in the clinic, Dr. Beryle Beams.  See problem based charting.

## 2014-07-21 NOTE — Assessment & Plan Note (Signed)
The wound overall appears to have made great progress. Given its size, it is likely to take several weeks or even more than a month before it can cause completely. Secondary closure might even be considered. Otherwise, currently wound does not show any signs of infection. Wound cultures revealed rare Streptococcus C. Plan -Followup with Gen. surgery on 07/26/2014 -Continue with twice a day dressing -Check CBC with a differential. No indication for antibiotics -Check CMP -Will prescribe 45 pills of Vicodin 10-325 mg -Followup here in 2 weeks

## 2014-07-21 NOTE — Assessment & Plan Note (Signed)
No symptoms of anemia. She has sickle cell trait , which was revealed on an electrophoresis. Last hemoglobin less than 2 weeks ago, was 9.7. Plan -Check CBC -No indication for transfusion

## 2014-07-21 NOTE — Patient Instructions (Signed)
General Instructions: Please continue with the great job of dressing the wound Please come back in 2 weeks here  Please follow up with the surgeon   Please bring your medicines with you each time you come to clinic.  Medicines may include prescription medications, over-the-counter medications, herbal remedies, eye drops, vitamins, or other pills.   Progress Toward Treatment Goals:  Treatment Goal 08/09/2013  Hemoglobin A1C deteriorated  Blood pressure deteriorated  Prevent falls -    Self Care Goals & Plans:  Self Care Goal 02/23/2014  Manage my medications take my medicines as prescribed; bring my medications to every visit; refill my medications on time; follow the sick day instructions if I am sick  Monitor my health keep track of my blood glucose; keep track of my weight; check my feet daily  Eat healthy foods eat more vegetables; eat fruit for snacks and desserts; eat smaller portions  Be physically active find an activity I enjoy    Home Blood Glucose Monitoring 07/08/2013  Check my blood sugar 3 times a day  When to check my blood sugar before breakfast; before lunch; before dinner     Care Management & Community Referrals:  Referral 07/08/2013  Referrals made for care management support diabetes educator  Referrals made to community resources -

## 2014-07-22 ENCOUNTER — Telehealth: Payer: Self-pay | Admitting: Internal Medicine

## 2014-07-22 ENCOUNTER — Telehealth: Payer: Self-pay | Admitting: *Deleted

## 2014-07-22 LAB — COMPLETE METABOLIC PANEL WITH GFR
ALT: 13 U/L (ref 0–35)
AST: 18 U/L (ref 0–37)
Albumin: 3.2 g/dL — ABNORMAL LOW (ref 3.5–5.2)
Alkaline Phosphatase: 294 U/L — ABNORMAL HIGH (ref 39–117)
BUN: 16 mg/dL (ref 6–23)
CO2: 24 mEq/L (ref 19–32)
Calcium: 10 mg/dL (ref 8.4–10.5)
Chloride: 108 mEq/L (ref 96–112)
Creat: 1.09 mg/dL (ref 0.50–1.10)
GFR, Est African American: 67 mL/min
GFR, Est Non African American: 59 mL/min — ABNORMAL LOW
Glucose, Bld: 40 mg/dL — CL (ref 70–99)
Potassium: 3.5 mEq/L (ref 3.5–5.3)
Sodium: 140 mEq/L (ref 135–145)
Total Bilirubin: 0.8 mg/dL (ref 0.2–1.2)
Total Protein: 7 g/dL (ref 6.0–8.3)

## 2014-07-22 LAB — CBC WITH DIFFERENTIAL/PLATELET
Basophils Absolute: 0.2 10*3/uL — ABNORMAL HIGH (ref 0.0–0.1)
Basophils Relative: 2 % — ABNORMAL HIGH (ref 0–1)
Eosinophils Absolute: 0.4 10*3/uL (ref 0.0–0.7)
Eosinophils Relative: 4 % (ref 0–5)
HCT: 34.1 % — ABNORMAL LOW (ref 36.0–46.0)
Hemoglobin: 11.1 g/dL — ABNORMAL LOW (ref 12.0–15.0)
Lymphocytes Relative: 37 % (ref 12–46)
Lymphs Abs: 3.7 10*3/uL (ref 0.7–4.0)
MCH: 30.5 pg (ref 26.0–34.0)
MCHC: 32.6 g/dL (ref 30.0–36.0)
MCV: 93.7 fL (ref 78.0–100.0)
Monocytes Absolute: 0.8 10*3/uL (ref 0.1–1.0)
Monocytes Relative: 8 % (ref 3–12)
Neutro Abs: 4.9 10*3/uL (ref 1.7–7.7)
Neutrophils Relative %: 49 % (ref 43–77)
Platelets: 310 10*3/uL (ref 150–400)
RBC: 3.64 MIL/uL — ABNORMAL LOW (ref 3.87–5.11)
RDW: 15.7 % — ABNORMAL HIGH (ref 11.5–15.5)
WBC: 10.1 10*3/uL (ref 4.0–10.5)

## 2014-07-22 NOTE — Progress Notes (Signed)
Medicine attending: I personally interviewed and examined this patient together with resident physician Dr. Jessee Avers and I concur with evaluation and management plan. I am familiar with this lady from recent hospitalization. She presented with a large cellulitis and abscess left proximal medial thigh and progressive normochromic anemia. She had marked leukocytosis. She required surgical incision and drainage of the abscess. She has deep wounds in the left thigh which her daughter has been packing on a daily basis. The wounds look like healthy granulation tissue. There is scant nonpurulent exudate. No evidence of active infection at this time. She is encouraged to followup with Gen. surgery. There is a small area on one of the ones that will likely need some light debridement. Special hematology test done during the hospitalization unrevealing to date. She is found to be positive for sickle cell trait. Normal G6PD enzyme level. Normal serum myeloma screen.  Murriel Hopper, M.D., [place]

## 2014-07-22 NOTE — Telephone Encounter (Signed)
Call made to patient-CMP results showed a glucose of 40.  Pt states she was not having any hypoglycemic symptoms, but stated she had eaten breakfast and hadnt eaten lunch.  Pt was asked to check glucose while I was on the phone-glucose 140 (fasting).  Pt states her morning glucoses are usually around 125, she states it usually goes up to around 150-160 a hour after taking synthroid and benicar.

## 2014-07-22 NOTE — Telephone Encounter (Signed)
I tried to contact the patient over the phone, however she did not pick my phone call. I left a voicemail, simply stating that all her labs looked good. She was contacted in the morning by RN Ulis Rias regarding her hypoglycemia of 40 that was seen on a CMP. Please see details prior documentation for details. Requested her to call with questions.

## 2014-07-29 ENCOUNTER — Telehealth (INDEPENDENT_AMBULATORY_CARE_PROVIDER_SITE_OTHER): Payer: Self-pay | Admitting: General Surgery

## 2014-07-29 NOTE — Telephone Encounter (Signed)
She does not need antibiotics this far out as long as she is nt having fevers and no purulent drainage. She can come to urge Monday if they are concerned. Thx

## 2014-07-29 NOTE — Telephone Encounter (Signed)
Beth, [facility] nurse, called in to let us know that this patient was seen on 07/26/14 for s/p I&D of complicated thigh abscess that was lanced on 07/01/14 by Dr. Grandville Silos.  They opened it up again in the office here and continued with home health and packing the wound.  The nurse is concerned because this incision is still incredibly hard around the edges and the patient's pain level for this incision is very high.  She explained that she is jumping off the couch during the dressing changes.  The nurse would like to know what to expect with this patient and the hardness for the wound and if the patient should be on antibiotics since none was prescribed during her office visit.  Informed the nurse that hardness around the incision site it to be expected and that this generally dissipates with time; however she says this feels harder than what a normal incision feels like when there is an inflammatory process.  Informed her I would send this to Dr. Grandville Silos for him to advise on.  Explained that urgent office is full today but that if Dr. Grandville Silos believes she needs to be seen again sooner than her next appt on 11/10 then I could place her on urge for Monday. We will need to call Beth back at [phone].

## 2014-07-29 NOTE — Telephone Encounter (Signed)
Let Kayla [name] know of this information and that if they were still concerned on Monday to give [place] a call and we could put her in the urgent office.

## 2014-08-01 ENCOUNTER — Encounter: Payer: Self-pay | Admitting: Internal Medicine

## 2014-08-09 ENCOUNTER — Telehealth: Payer: Self-pay | Admitting: Licensed Clinical Social Worker

## 2014-08-09 NOTE — Telephone Encounter (Signed)
CSW received voice mail call from [place] requesting order for [facility] to assist pt with community resources, medicaid and disability application.  [facility] fax# [phone] [facility] - [phone].  Will route to PCP and Triage for order.

## 2014-08-10 ENCOUNTER — Other Ambulatory Visit: Payer: Self-pay | Admitting: Internal Medicine

## 2014-08-10 DIAGNOSIS — L0390 Cellulitis, unspecified: Principal | ICD-10-CM

## 2014-08-10 DIAGNOSIS — L0291 Cutaneous abscess, unspecified: Secondary | ICD-10-CM

## 2014-08-10 NOTE — Telephone Encounter (Signed)
Copy of order on S. Grady's desk.

## 2014-08-10 NOTE — Telephone Encounter (Signed)
I put an order for it in epic. Please let me know what else needs to be done. Thanks, Dr. Genene Churn.

## 2014-08-11 NOTE — Progress Notes (Signed)
Thank you.  CSW placed call to [facility] notified referral has been faxed.  CSW faxed referral to number provided from [facility].

## 2014-08-17 ENCOUNTER — Other Ambulatory Visit: Payer: Self-pay | Admitting: *Deleted

## 2014-08-17 DIAGNOSIS — E1165 Type 2 diabetes mellitus with hyperglycemia: Principal | ICD-10-CM

## 2014-08-17 DIAGNOSIS — IMO0002 Reserved for concepts with insufficient information to code with codable children: Secondary | ICD-10-CM

## 2014-08-17 DIAGNOSIS — E1140 Type 2 diabetes mellitus with diabetic neuropathy, unspecified: Secondary | ICD-10-CM

## 2014-08-17 MED ORDER — INSULIN ASPART 100 UNIT/ML ~~LOC~~ SOLN: 3.0000 [IU] | Freq: Three times a day (TID) | SUBCUTANEOUS | Status: DC

## 2014-08-17 NOTE — Telephone Encounter (Signed)
Rx called in to pharmacy. 

## 2014-09-06 ENCOUNTER — Other Ambulatory Visit: Payer: Self-pay | Admitting: *Deleted

## 2014-09-06 DIAGNOSIS — IMO0002 Reserved for concepts with insufficient information to code with codable children: Secondary | ICD-10-CM

## 2014-09-06 DIAGNOSIS — E1140 Type 2 diabetes mellitus with diabetic neuropathy, unspecified: Secondary | ICD-10-CM

## 2014-09-06 DIAGNOSIS — E1165 Type 2 diabetes mellitus with hyperglycemia: Principal | ICD-10-CM

## 2014-09-06 NOTE — Telephone Encounter (Signed)
Will forward to PCP 

## 2014-09-06 NOTE — Telephone Encounter (Signed)
Last dispensed 08/01/2014

## 2014-09-07 MED ORDER — DULOXETINE HCL 30 MG PO CPEP: 60.0000 mg | ORAL_CAPSULE | Freq: Every day | ORAL | Status: DC

## 2014-10-03 ENCOUNTER — Other Ambulatory Visit: Payer: Self-pay | Admitting: *Deleted

## 2014-10-04 MED ORDER — ZOLPIDEM TARTRATE 5 MG PO TABS: 5.0000 mg | ORAL_TABLET | Freq: Every evening | ORAL | Status: DC | PRN

## 2014-10-04 NOTE — Telephone Encounter (Signed)
Please call it in. Thanks.

## 2014-10-04 NOTE — Telephone Encounter (Signed)
Ambien rx called to [place].

## 2014-10-20 ENCOUNTER — Other Ambulatory Visit: Payer: Self-pay | Admitting: *Deleted

## 2014-10-20 DIAGNOSIS — E1140 Type 2 diabetes mellitus with diabetic neuropathy, unspecified: Secondary | ICD-10-CM

## 2014-10-20 DIAGNOSIS — E1165 Type 2 diabetes mellitus with hyperglycemia: Principal | ICD-10-CM

## 2014-10-20 DIAGNOSIS — IMO0002 Reserved for concepts with insufficient information to code with codable children: Secondary | ICD-10-CM

## 2014-11-01 MED ORDER — DULOXETINE HCL 30 MG PO CPEP: 60.0000 mg | ORAL_CAPSULE | Freq: Every day | ORAL | Status: DC

## 2014-11-01 MED ORDER — LEVOTHYROXINE SODIUM 150 MCG PO TABS: 150.0000 ug | ORAL_TABLET | Freq: Every day | ORAL | Status: DC

## 2014-11-02 NOTE — Telephone Encounter (Signed)
Called to pharmacy 

## 2014-11-07 ENCOUNTER — Other Ambulatory Visit: Payer: Self-pay | Admitting: Internal Medicine

## 2014-11-11 NOTE — Telephone Encounter (Signed)
Called to pharm 

## 2014-11-14 ENCOUNTER — Encounter (HOSPITAL_COMMUNITY): Payer: Self-pay | Admitting: Emergency Medicine

## 2014-11-14 ENCOUNTER — Emergency Department (HOSPITAL_COMMUNITY): Payer: Worker's Compensation

## 2014-11-14 ENCOUNTER — Emergency Department (HOSPITAL_COMMUNITY)
Admission: EM | Admit: 2014-11-14 | Discharge: 2014-11-14 | Disposition: A | Discharge status: Home / Self Care | Payer: Worker's Compensation | Attending: Emergency Medicine | Admitting: Emergency Medicine

## 2014-11-14 DIAGNOSIS — Y9289 Other specified places as the place of occurrence of the external cause: Secondary | ICD-10-CM | POA: Insufficient documentation

## 2014-11-14 DIAGNOSIS — Z862 Personal history of diseases of the blood and blood-forming organs and certain disorders involving the immune mechanism: Secondary | ICD-10-CM | POA: Diagnosis not present

## 2014-11-14 DIAGNOSIS — F419 Anxiety disorder, unspecified: Secondary | ICD-10-CM | POA: Diagnosis not present

## 2014-11-14 DIAGNOSIS — Z79899 Other long term (current) drug therapy: Secondary | ICD-10-CM | POA: Diagnosis not present

## 2014-11-14 DIAGNOSIS — E039 Hypothyroidism, unspecified: Secondary | ICD-10-CM | POA: Diagnosis not present

## 2014-11-14 DIAGNOSIS — I10 Essential (primary) hypertension: Secondary | ICD-10-CM | POA: Insufficient documentation

## 2014-11-14 DIAGNOSIS — Z872 Personal history of diseases of the skin and subcutaneous tissue: Secondary | ICD-10-CM | POA: Diagnosis not present

## 2014-11-14 DIAGNOSIS — J449 Chronic obstructive pulmonary disease, unspecified: Secondary | ICD-10-CM | POA: Insufficient documentation

## 2014-11-14 DIAGNOSIS — Z8619 Personal history of other infectious and parasitic diseases: Secondary | ICD-10-CM | POA: Diagnosis not present

## 2014-11-14 DIAGNOSIS — Y9301 Activity, walking, marching and hiking: Secondary | ICD-10-CM | POA: Diagnosis not present

## 2014-11-14 DIAGNOSIS — S82892A Other fracture of left lower leg, initial encounter for closed fracture: Secondary | ICD-10-CM

## 2014-11-14 DIAGNOSIS — M25579 Pain in unspecified ankle and joints of unspecified foot: Secondary | ICD-10-CM

## 2014-11-14 DIAGNOSIS — S8252XA Displaced fracture of medial malleolus of left tibia, initial encounter for closed fracture: Secondary | ICD-10-CM | POA: Insufficient documentation

## 2014-11-14 DIAGNOSIS — Z87891 Personal history of nicotine dependence: Secondary | ICD-10-CM | POA: Insufficient documentation

## 2014-11-14 DIAGNOSIS — W000XXA Fall on same level due to ice and snow, initial encounter: Secondary | ICD-10-CM | POA: Insufficient documentation

## 2014-11-14 DIAGNOSIS — S9305XA Dislocation of left ankle joint, initial encounter: Secondary | ICD-10-CM

## 2014-11-14 DIAGNOSIS — S99912A Unspecified injury of left ankle, initial encounter: Secondary | ICD-10-CM | POA: Diagnosis present

## 2014-11-14 DIAGNOSIS — Y990 Civilian activity done for income or pay: Secondary | ICD-10-CM | POA: Insufficient documentation

## 2014-11-14 DIAGNOSIS — Z794 Long term (current) use of insulin: Secondary | ICD-10-CM | POA: Diagnosis not present

## 2014-11-14 DIAGNOSIS — S8262XA Displaced fracture of lateral malleolus of left fibula, initial encounter for closed fracture: Secondary | ICD-10-CM | POA: Diagnosis not present

## 2014-11-14 DIAGNOSIS — H9191 Unspecified hearing loss, right ear: Secondary | ICD-10-CM | POA: Diagnosis not present

## 2014-11-14 DIAGNOSIS — R011 Cardiac murmur, unspecified: Secondary | ICD-10-CM | POA: Insufficient documentation

## 2014-11-14 DIAGNOSIS — R739 Hyperglycemia, unspecified: Secondary | ICD-10-CM

## 2014-11-14 DIAGNOSIS — E1165 Type 2 diabetes mellitus with hyperglycemia: Secondary | ICD-10-CM | POA: Insufficient documentation

## 2014-11-14 DIAGNOSIS — M1990 Unspecified osteoarthritis, unspecified site: Secondary | ICD-10-CM | POA: Diagnosis not present

## 2014-11-14 LAB — CBC WITH DIFFERENTIAL/PLATELET
Basophils Absolute: 0.1 10*3/uL (ref 0.0–0.1)
Basophils Relative: 2 % — ABNORMAL HIGH (ref 0–1)
Eosinophils Absolute: 0.1 10*3/uL (ref 0.0–0.7)
Eosinophils Relative: 2 % (ref 0–5)
HCT: 36.6 % (ref 36.0–46.0)
Hemoglobin: 12.8 g/dL (ref 12.0–15.0)
Lymphocytes Relative: 21 % (ref 12–46)
Lymphs Abs: 1.3 10*3/uL (ref 0.7–4.0)
MCH: 30.6 pg (ref 26.0–34.0)
MCHC: 35 g/dL (ref 30.0–36.0)
MCV: 87.6 fL (ref 78.0–100.0)
Monocytes Absolute: 0.4 10*3/uL (ref 0.1–1.0)
Monocytes Relative: 6 % (ref 3–12)
Neutro Abs: 4.3 10*3/uL (ref 1.7–7.7)
Neutrophils Relative %: 69 % (ref 43–77)
Platelets: 183 10*3/uL (ref 150–400)
RBC: 4.18 MIL/uL (ref 3.87–5.11)
RDW: 12.7 % (ref 11.5–15.5)
WBC: 6.3 10*3/uL (ref 4.0–10.5)

## 2014-11-14 LAB — BASIC METABOLIC PANEL
Anion gap: 6 (ref 5–15)
BUN: 14 mg/dL (ref 6–23)
CO2: 29 mmol/L (ref 19–32)
Calcium: 9.8 mg/dL (ref 8.4–10.5)
Chloride: 99 mmol/L (ref 96–112)
Creatinine, Ser: 1.55 mg/dL — ABNORMAL HIGH (ref 0.50–1.10)
GFR calc Af Amer: 43 mL/min — ABNORMAL LOW (ref >90)
GFR calc non Af Amer: 37 mL/min — ABNORMAL LOW (ref >90)
Glucose, Bld: 374 mg/dL — ABNORMAL HIGH (ref 70–99)
Potassium: 4 mmol/L (ref 3.5–5.1)
Sodium: 134 mmol/L — ABNORMAL LOW (ref 135–145)

## 2014-11-14 LAB — TYPE AND SCREEN
ABO/RH(D): O POS
Antibody Screen: NEGATIVE

## 2014-11-14 MED ORDER — HYDROMORPHONE HCL 1 MG/ML IJ SOLN
1.0000 mg | Freq: Once | INTRAMUSCULAR | Status: AC
  Administered 2014-11-14: 1 mg via INTRAVENOUS
  Filled 2014-11-14: qty 1

## 2014-11-14 MED ORDER — ONDANSETRON HCL 4 MG/2ML IJ SOLN
4.0000 mg | Freq: Once | INTRAMUSCULAR | Status: AC
  Administered 2014-11-14: 4 mg via INTRAVENOUS
  Filled 2014-11-14: qty 2

## 2014-11-14 MED ORDER — HYDROCODONE-ACETAMINOPHEN 5-325 MG PO TABS: 2.0000 | ORAL_TABLET | ORAL | Status: DC | PRN

## 2014-11-14 MED ORDER — SODIUM CHLORIDE 0.9 % IV BOLUS (SEPSIS)
1000.0000 mL | Freq: Once | INTRAVENOUS | Status: AC
  Administered 2014-11-14: 1000 mL via INTRAVENOUS

## 2014-11-14 MED ORDER — ETOMIDATE 2 MG/ML IV SOLN
5.0000 mg | Freq: Once | INTRAVENOUS | Status: AC
  Administered 2014-11-14: 5 mg via INTRAVENOUS
  Filled 2014-11-14: qty 10

## 2014-11-14 NOTE — ED Notes (Signed)
Orthopedic surgery at bedside

## 2014-11-14 NOTE — ED Notes (Signed)
Airway cart, crash cart, suction, oxygen set up at bedside. Ortho and respiratory at bedside.

## 2014-11-14 NOTE — Consult Note (Signed)
ORTHOPAEDIC CONSULTATION  REQUESTING PHYSICIAN: Sharyon Cable, MD  Chief Complaint: Left ankle pain  HPI: Kayla [name] is a 54 y.o. female who complains of  left severe ankle pain after a slip on ice today. She's had a previous ankle fracture before, which was treated nonsurgically. Her pain is acute, severe, worse with movement, better with rest and pain medications. She is also improved since her closed reduction was performed by Dr. Christy Gentles in the emergency room. She has multiple other medical problems as indicated below. She denies any other injuries.  Past Medical History  Diagnosis Date  . ANA POSITIVE 02/09/2010  . Hyperlipidemia   . Dizziness     Multiple falls at home  . Sickle cell trait   . Fibromyalgia   . Hypothyroidism   . 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 Anal warts 11/09/2012  . Hypertension   . Heart murmur   . Chronic bronchitis     "get it changing of the seasons; sometimes q yr; sometimes not"   . COPD (chronic obstructive pulmonary disease)   . Graves' disease     "they nuked it twice"  . PNPYYFRT(021.1)     "monthly" (06/27/2014)  . Arthritis     "my bones ache all the time" (06/27/2014)  . Type 2 diabetes mellitus   . Diabetic peripheral neuropathy since <2005    "hands and feet"  . Anxiety   . Cellulitis 06/27/2014    medial proximal left thigh extending to the perineium   Past Surgical History  Procedure Laterality Date  . Appendectomy  1986  . Cesarean section  1986, 1993  . Dilation and curettage of uterus  1990's  . Ankle fracture surgery Left 2008    "didn't put hardware in"  . Fracture surgery    . Incision and drainage abscess Left 07/01/2014    Procedure: INCISION AND DRAINAGE ABSCESS OF LEFT THIGH;  Surgeon: Georganna Skeans, MD;  Location: [facility];  Service: General;  Laterality: Left;   History   Social History  . Marital Status: Single    Spouse Name: N/A  . Number of Children: N/A  . Years of  Education: 12   Occupational History  .     Social History Main Topics  . Smoking status: Former Smoker -- 0.75 packs/day for 5 years    Types: Cigarettes    Quit date: 09/30/1990  . Smokeless tobacco: Never Used  . Alcohol Use: No  . Drug Use: No  . Sexual Activity: Not Currently   Other Topics Concern  . None   Social History Narrative   Lives in [place], alone. Looking for a job. Has a son in town who checks in on her.    Family History  Problem Relation Age of Onset  . Emphysema Mother   . Cancer Mother     unknown  . Hypertension Mother   . Cancer Father     pancreatic   . Diabetes Father   . Hypertension Father   . Down syndrome Brother   . Cancer Maternal Aunt     cancer  . Cancer Other     breast cancer aunt  . Stroke Sister   . Diabetes Sister   . Colon cancer Neg Hx   . Stomach cancer Neg Hx    Allergies  Allergen Reactions  . Ace Inhibitors     Dizziness, nausea  . Percocet [Oxycodone-Acetaminophen] Itching  . Statins   .  Sulfamethoxazole Rash  . Sulfites Rash   Prior to Admission medications   Medication Sig Start Date End Date Taking? Authorizing Provider  ACCU-CHEK FASTCLIX LANCETS MISC 1 each by Does not apply route 4 (four) times daily -  before meals and at bedtime. 05/10/11   Elyse Jarvis, MD  Blood Glucose Monitoring Suppl (ACCU-CHEK AVIVA PLUS) W/DEVICE KIT 1 kit by Does not apply route as directed. 05/15/11   Burns Spain, MD  Blood Glucose Monitoring Suppl (ACCU-CHEK NANO SMARTVIEW) W/DEVICE KIT 1 each by Does not apply route 4 (four) times daily -  before meals and at bedtime. 05/10/11   Elyse Jarvis, MD  collagenase (SANTYL) ointment Apply topically 3 (three) times daily. 07/06/14   Tasrif Ahmed, MD  DULoxetine (CYMBALTA) 30 MG capsule Take 2 capsules (60 mg total) by mouth daily. 11/01/14   Hyacinth Meeker, MD  gabapentin (NEURONTIN) 300 MG capsule Take 2 capsules (600 mg total) by mouth 4 (four) times daily. 03/14/14 03/14/15  Kristie Cowman, MD  guaiFENesin-dextromethorphan (ROBITUSSIN DM) 100-10 MG/5ML syrup Take 5 mLs by mouth every 4 (four) hours as needed for cough. 02/23/14   Cathlean Cower, MD  HYDROcodone-acetaminophen (NORCO) 10-325 MG per tablet Take 1 tablet by mouth every 6 (six) hours as needed. 07/21/14   Dow Adolph, MD  insulin aspart (NOVOLOG) 100 UNIT/ML injection Inject 3-5 Units into the skin 3 (three) times daily before meals. 08/17/14   Tasrif Ahmed, MD  insulin glargine (LANTUS) 100 UNIT/ML injection Inject 0.4 mLs (40 Units total) into the skin daily. 07/06/14   Hyacinth Meeker, MD  levothyroxine (SYNTHROID, LEVOTHROID) 150 MCG tablet Take 1 tablet (150 mcg total) by mouth daily before breakfast. 11/01/14   Hyacinth Meeker, MD  olmesartan (BENICAR) 40 MG tablet Take 40 mg by mouth daily as needed. Patient only takes if she feels her blood pressure is high, will have daughter check if over 130 patient will take medication.    Historical Provider, MD  polyethylene glycol (MIRALAX / GLYCOLAX) packet Take 17 g by mouth daily. 07/06/14   Tasrif Ahmed, MD  senna-docusate (SENOKOT-S) 8.6-50 MG per tablet Take 1 tablet by mouth at bedtime. 07/06/14   Tasrif Ahmed, MD  zolpidem (AMBIEN) 5 MG tablet TAKE 1 TABLET BY MOUTH AT BEDTIME AS NEEDED 11/07/14   Hyacinth Meeker, MD   Dg Knee Left Port  11/14/2014   CLINICAL DATA:  Slipped on the ice with twisting injury to the left ankle.  EXAM: PORTABLE LEFT KNEE - 1-2 VIEW  COMPARISON:  None.  FINDINGS: Two portable views of the left knee are negative for fracture or dislocation. No acute soft tissue abnormality is evident. There is no radiopaque foreign body.  IMPRESSION: No acute finding   Electronically Signed   By: Ellery Plunk M.D.   On: 11/14/2014 21:06   Dg Ankle Left Port  11/14/2014   CLINICAL DATA:  Status post reduction of left ankle fracture dislocation.  EXAM: PORTABLE LEFT ANKLE - 2 VIEW  COMPARISON:  Plain films of the left ankle 11/14/2014 at 20:17 hours.   FINDINGS: The patient is in a new fiberglass cast. The tibiotalar joint has been reduced. Posterior malleolar and distal fibular fractures are again seen.  IMPRESSION: Successful reduction of the tibiotalar joint and patient with distal fibular and posterior malleolar fractures.   Electronically Signed   By: Drusilla Kanner M.D.   On: 11/14/2014 21:40   Dg Ankle Left Port  11/14/2014   CLINICAL DATA:  Status post  slip and fall on ice today. Left ankle pain and deformity. Initial encounter.  EXAM: PORTABLE LEFT ANKLE - 2 VIEW  COMPARISON:  None.  FINDINGS: The patient has a comminuted fracture of the distal fibula with 20 degrees dorsal angulation of the distal fragment. There is also likely a posterior malleolus fracture. The tibiotalar joint is posteriorly and laterally dislocated. Soft tissue swelling is noted.  IMPRESSION: Fracture dislocation tibiotalar joint as described.   Electronically Signed   By: Inge Rise M.D.   On: 11/14/2014 21:06    Positive ROS: All other systems have been reviewed and were otherwise negative with the exception of those mentioned in the HPI and as above.  Physical Exam: General: Alert, no acute distress Cardiovascular: No pedal edema Respiratory: No cyanosis, no use of accessory musculature GI: No organomegaly, abdomen is soft and non-tender Skin: No lesions in the area of chief complaint Neurologic: Sensation intact distally Psychiatric: Patient is competent for consent with normal mood and affect Lymphatic: No axillary or cervical lymphadenopathy  MUSCULOSKELETAL: Left ankle has normal overall clinical alignment, she is currently in a splint, sensation intact at the foot and EHL and FHL are firing.  Assessment: Left ankle fracture dislocation, status post closed reduction, with poorly controlled diabetes with neuropathy  Plan: This is an acute severe injury, and I recommended allowing the swelling to subside prior to planning surgical intervention.  This does carry a low risk for loss of function, as well as infection, and posttraumatic arthritis.  On the plan for surgical intervention probably in the next 7-10 days, and she is comfortable come back and see me this upcoming Thursday afternoon for a repeat check of her skin. In the meantime she should be nonweightbearing, strict elevation of the leg, pain medications per the emergency room, with crutches or a walker.    Johnny Bridge, MD Cell [phone]   11/14/2014 10:00 PM

## 2014-11-14 NOTE — ED Provider Notes (Signed)
CSN: [id number]     Arrival date & time 11/14/14  1955 History   First MD Initiated Contact with Patient 11/14/14 1957     Chief Complaint  Patient presents with  . Ankle Pain     Patient is a 54 y.o. female presenting with ankle pain. The history is provided by the patient.  Ankle Pain Location:  Ankle Time since incident: just prior to arrival. Injury: yes   Mechanism of injury: fall   Fall:    Fall occurred: down a ramp. Ankle location:  L ankle Pain details:    Quality:  Sharp   Severity:  Severe   Onset quality:  Sudden   Timing:  Constant   Progression:  Worsening Chronicity:  New Dislocation: yes   Relieved by:  Rest Worsened by:  Activity Associated symptoms: no back pain, no fever and no neck pain   Patient reports she slipped on a ramp at work and fell to ground twisting her left ankle She reports pain in left ankle No head injury.  No LOC No neck or back injury No abdominal pain  Last meal was yesterday She does not have local orthopedist   Past Medical History  Diagnosis Date  . ANA POSITIVE 02/09/2010  . Hyperlipidemia   . Dizziness     Multiple falls at home  . Sickle cell trait   . Fibromyalgia   . Hypothyroidism   . 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 Anal warts 11/09/2012  . Hypertension   . Heart murmur   . Chronic bronchitis     "get it changing of the seasons; sometimes q yr; sometimes not"   . COPD (chronic obstructive pulmonary disease)   . Graves' disease     "they nuked it twice"  . OIZTIWPY(099.8)     "monthly" (06/27/2014)  . Arthritis     "my bones ache all the time" (06/27/2014)  . Type 2 diabetes mellitus   . Diabetic peripheral neuropathy since <2005    "hands and feet"  . Anxiety   . Cellulitis 06/27/2014    medial proximal left thigh extending to the perineium   Past Surgical History  Procedure Laterality Date  . Appendectomy  1986  . Cesarean section  1986, 1993  . Dilation and curettage of  uterus  1990's  . Ankle fracture surgery Left 2008    "didn't put hardware in"  . Fracture surgery    . Incision and drainage abscess Left 07/01/2014    Procedure: INCISION AND DRAINAGE ABSCESS OF LEFT THIGH;  Surgeon: Georganna Skeans, MD;  Location: [facility] OR;  Service: General;  Laterality: Left;   Family History  Problem Relation Age of Onset  . Emphysema Mother   . Cancer Mother     unknown  . Hypertension Mother   . Cancer Father     pancreatic   . Diabetes Father   . Hypertension Father   . Down syndrome Brother   . Cancer Maternal Aunt     cancer  . Cancer Other     breast cancer aunt  . Stroke Sister   . Diabetes Sister   . Colon cancer Neg Hx   . Stomach cancer Neg Hx    History  Substance Use Topics  . Smoking status: Former Smoker -- 0.75 packs/day for 5 years    Types: Cigarettes    Quit date: 09/30/1990  . Smokeless tobacco: Never Used  . Alcohol Use: No  OB History    Gravida Para Term Preterm AB TAB SAB Ectopic Multiple Living   '3 2 2 ' 0 1 1 0 0 0 2     Review of Systems  Constitutional: Negative for fever.  Cardiovascular: Negative for chest pain.  Gastrointestinal: Negative for vomiting.  Musculoskeletal: Positive for arthralgias. Negative for back pain and neck pain.  Neurological: Negative for headaches.  Psychiatric/Behavioral: The patient is nervous/anxious.   All other systems reviewed and are negative.     Allergies  Ace inhibitors; Percocet; Statins; Sulfamethoxazole; and Sulfites  Home Medications   Prior to Admission medications   Medication Sig Start Date End Date Taking? Authorizing Provider  ACCU-CHEK FASTCLIX LANCETS MISC 1 each by Does not apply route 4 (four) times daily -  before meals and at bedtime. 05/10/11   Pedro Earls, MD  Blood Glucose Monitoring Suppl (ACCU-CHEK AVIVA PLUS) W/DEVICE KIT 1 kit by Does not apply route as directed. 05/15/11   Bartholomew Crews, MD  Blood Glucose Monitoring Suppl (ACCU-CHEK NANO SMARTVIEW)  W/DEVICE KIT 1 each by Does not apply route 4 (four) times daily -  before meals and at bedtime. 05/10/11   Pedro Earls, MD  collagenase (SANTYL) ointment Apply topically 3 (three) times daily. 07/06/14   Tasrif Ahmed, MD  DULoxetine (CYMBALTA) 30 MG capsule Take 2 capsules (60 mg total) by mouth daily. 11/01/14   Dellia Nims, MD  gabapentin (NEURONTIN) 300 MG capsule Take 2 capsules (600 mg total) by mouth 4 (four) times daily. 03/14/14 03/14/15  Dorian Heckle, MD  guaiFENesin-dextromethorphan (ROBITUSSIN DM) 100-10 MG/5ML syrup Take 5 mLs by mouth every 4 (four) hours as needed for cough. 02/23/14   Malena Catholic, MD  HYDROcodone-acetaminophen (NORCO) 10-325 MG per tablet Take 1 tablet by mouth every 6 (six) hours as needed. 07/21/14   Jessee Avers, MD  insulin aspart (NOVOLOG) 100 UNIT/ML injection Inject 3-5 Units into the skin 3 (three) times daily before meals. 08/17/14   Tasrif Ahmed, MD  insulin glargine (LANTUS) 100 UNIT/ML injection Inject 0.4 mLs (40 Units total) into the skin daily. 07/06/14   Dellia Nims, MD  levothyroxine (SYNTHROID, LEVOTHROID) 150 MCG tablet Take 1 tablet (150 mcg total) by mouth daily before breakfast. 11/01/14   Dellia Nims, MD  olmesartan (BENICAR) 40 MG tablet Take 40 mg by mouth daily as needed. Patient only takes if she feels her blood pressure is high, will have daughter check if over 130 patient will take medication.    Historical Provider, MD  polyethylene glycol (MIRALAX / GLYCOLAX) packet Take 17 g by mouth daily. 07/06/14   Tasrif Ahmed, MD  senna-docusate (SENOKOT-S) 8.6-50 MG per tablet Take 1 tablet by mouth at bedtime. 07/06/14   Tasrif Ahmed, MD  zolpidem (AMBIEN) 5 MG tablet TAKE 1 TABLET BY MOUTH AT BEDTIME AS NEEDED 11/07/14   Tasrif Ahmed, MD   BP 166/81 mmHg  Pulse 77  Temp(Src) 97.9 F (36.6 C) (Oral)  Resp 18  SpO2 99%  LMP 01/12/2014 Physical Exam CONSTITUTIONAL: Well developed/well nourished, anxious HEAD:  Normocephalic/atraumatic EYES: EOMI/PERRL ENMT: Mucous membranes moist, uvula midline NECK: supple no meningeal signs SPINE/BACK:no cervical spine tenderness CV: S1/S2 noted, no murmurs/rubs/gallops noted LUNGS: Lungs are clear to auscultation bilaterally, no apparent distress ABDOMEN: soft, nontender, no rebound or guarding, bowel sounds noted throughout abdomen NEURO: Pt is awake/alert/appropriate, moves all extremitiesx4.  No facial droop.   EXTREMITIES: pulses normal/equal, in both feet.  Left ankle with obvious deformity.  There is tenting of  skin of medial malleolus.  There is no laceration/abrasion.  There is tenderness to left prox fib.  No other bony tenderness. She can range left hip without pain SKIN: warm, color normal PSYCH: anxious  ED Course  Reduction of dislocation Date/Time: 11/14/2014 9:20 PM Performed by: Sharyon Cable Authorized by: Sharyon Cable Consent: Written consent obtained. Risks and benefits: risks, benefits and alternatives were discussed Consent given by: patient Patient identity confirmed: verbally with patient and arm band Time out: Immediately prior to procedure a "time out" was called to verify the correct patient, procedure, equipment, support staff and site/side marked as required. Patient sedated: yes Patient tolerance: Patient tolerated the procedure well with no immediate complications Comments: Patient underwent closed reduction of left ankle fracture/dislocation Patient was neurovascularly intact after procedure No immediate complications    Procedural sedation Performed by: Sharyon Cable Consent: Verbal consent obtained. Risks and benefits: risks, benefits and alternatives were discussed Required items: required devices, and special equipment available Patient identity confirmed: arm band and provided demographic data Time out: Immediately prior to procedure a "time out" was called to verify the correct patient, procedure,  equipment, support staff and site/side marked as required.  Sedation type: moderate (conscious) sedation NPO time confirmed and considedered  Sedatives: ETOMIDATE (pt had allergy to sulfide which EPIC alerted when attempted to order propofol) Physician Time at Bedside: 16 Patient checked on multiple times, she was awake/alert, no vomiting and no hypoxia after procedure Vitals: Vital signs were monitored during sedation. Cardiac Monitor, pulse oximeter Patient tolerance: Patient tolerated the procedure well with no immediate complications. Comments: Pt with uneventful recovery. Returned to pre-procedural sedation baseline  SPLINT APPLICATION Date/Time: 1856 Authorized by: Sharyon Cable Consent: Verbal consent obtained. Risks and benefits: risks, benefits and alternatives were discussed Consent given by: patient Splint applied by: orthopedic technician Location details: left ankle Splint type: stirrup/posterior Supplies used: fiberglass Post-procedure: The splinted body part was neurovascularly unchanged following the procedure. Patient tolerance: Patient tolerated the procedure well with no immediate complications.     9:26 PM Pt improving and near baseline D/w dr Mardelle Matte He requests CT ankle He will see patient in the ER   10:44 PM Pt seen by dr Mardelle Matte He has reviewed CT ankle He feels she can be discharged and f/u on 2/18 Pt is comfortable going home.  She feels improved She requests hydrocodone for pain control BP 120/69 mmHg  Pulse 74  Temp(Src) 97.9 F (36.6 C) (Oral)  Resp 16  SpO2 92%  LMP 01/12/2014  Labs Review Labs Reviewed  BASIC METABOLIC PANEL - Abnormal; Notable for the following:    Sodium 134 (*)    Glucose, Bld 374 (*)    Creatinine, Ser 1.55 (*)    GFR calc non Af Amer 37 (*)    GFR calc Af Amer 43 (*)    All other components within normal limits  CBC WITH DIFFERENTIAL/PLATELET - Abnormal; Notable for the following:    Basophils Relative  2 (*)    All other components within normal limits  TYPE AND SCREEN    Imaging Review Dg Knee Left Port  11/14/2014   CLINICAL DATA:  Slipped on the ice with twisting injury to the left ankle.  EXAM: PORTABLE LEFT KNEE - 1-2 VIEW  COMPARISON:  None.  FINDINGS: Two portable views of the left knee are negative for fracture or dislocation. No acute soft tissue abnormality is evident. There is no radiopaque foreign body.  IMPRESSION: No acute finding  Electronically Signed   By: Andreas Newport M.D.   On: 11/14/2014 21:06   Dg Ankle Left Port  11/14/2014   CLINICAL DATA:  Status post reduction of left ankle fracture dislocation.  EXAM: PORTABLE LEFT ANKLE - 2 VIEW  COMPARISON:  Plain films of the left ankle 11/14/2014 at 20:17 hours.  FINDINGS: The patient is in a new fiberglass cast. The tibiotalar joint has been reduced. Posterior malleolar and distal fibular fractures are again seen.  IMPRESSION: Successful reduction of the tibiotalar joint and patient with distal fibular and posterior malleolar fractures.   Electronically Signed   By: Inge Rise M.D.   On: 11/14/2014 21:40   Dg Ankle Left Port  11/14/2014   CLINICAL DATA:  Status post slip and fall on ice today. Left ankle pain and deformity. Initial encounter.  EXAM: PORTABLE LEFT ANKLE - 2 VIEW  COMPARISON:  None.  FINDINGS: The patient has a comminuted fracture of the distal fibula with 20 degrees dorsal angulation of the distal fragment. There is also likely a posterior malleolus fracture. The tibiotalar joint is posteriorly and laterally dislocated. Soft tissue swelling is noted.  IMPRESSION: Fracture dislocation tibiotalar joint as described.   Electronically Signed   By: Inge Rise M.D.   On: 11/14/2014 21:06     EKG Interpretation   Date/Time:  Monday November 14 2014 20:39:24 EST Ventricular Rate:  72 PR Interval:  126 QRS Duration: 74 QT Interval:  401 QTC Calculation: 439 R Axis:   -8 Text Interpretation:  Sinus  rhythm Consider left ventricular hypertrophy  Abnormal ekg No significant change since last tracing Confirmed by  Christy Gentles  MD, Jaishaun Mcnab ([id number]) on 11/14/2014 8:48:34 PM     Medications  sodium chloride 0.9 % bolus 1,000 mL (not administered)  HYDROmorphone (DILAUDID) injection 1 mg (not administered)  HYDROmorphone (DILAUDID) injection 1 mg (1 mg Intravenous Given 11/14/14 2023)  ondansetron (ZOFRAN) injection 4 mg (4 mg Intravenous Given 11/14/14 2023)  etomidate (AMIDATE) injection 5 mg (5 mg Intravenous Given 11/14/14 2108)  HYDROmorphone (DILAUDID) injection 1 mg (1 mg Intravenous Given 11/14/14 2112)    MDM   Final diagnoses:  Ankle pain  Hyperglycemia  Closed dislocation of left ankle, initial encounter  Closed fracture of left ankle, initial encounter    Nursing notes including past medical history and social history reviewed and considered in documentation xrays/imaging reviewed by myself and considered during evaluation Labs/vital reviewed myself and considered during evaluation     Sharyon Cable, MD 11/14/14 2247

## 2014-11-14 NOTE — ED Notes (Signed)
Per [facility], pt was at work, walking down a ramp, slipped on the ice, twisted left ankle. Ankle is splinted, ankle warm to the touch, cap refill good. Previously broke same ankle. AAOX4, in NAD. BP 160/110, CBG is 407, hx of HTN, and Diabetes. Sensation intact, pt able to wiggle toes.

## 2014-11-14 NOTE — Discharge Instructions (Signed)
Ankle Fracture  A fracture is a break in a bone. The ankle joint is made up of three bones. These include the lower (distal)sections of your lower leg bones, called the tibia and fibula, along with a bone in your foot, called the talus. Depending on how bad the break is and if more than one ankle joint bone is broken, a cast or splint is used to protect and keep your injured bone from moving while it heals. Sometimes, surgery is required to help the fracture heal properly.   There are two general types of fractures:   Stable fracture. This includes a single fracture line through one bone, with no injury to ankle ligaments. A fracture of the talus that does not have any displacement (movement of the bone on either side of the fracture line) is also stable.   Unstable fracture. This includes more than one fracture line through one or more bones in the ankle joint. It also includes fractures that have displacement of the bone on either side of the fracture line.  CAUSES   A direct blow to the ankle.    Quickly and severely twisting your ankle.   Trauma, such as a car accident or falling from a significant height.  RISK FACTORS  You may be at a higher risk of ankle fracture if:   You have certain medical conditions.   You are involved in high-impact sports.   You are involved in a high-impact car accident.  SIGNS AND SYMPTOMS    Tender and swollen ankle.   Bruising around the injured ankle.   Pain on movement of the ankle.   Difficulty walking or putting weight on the ankle.   A cold foot below the site of the ankle injury. This can occur if the blood vessels passing through your injured ankle were also damaged.   Numbness in the foot below the site of the ankle injury.  DIAGNOSIS   An ankle fracture is usually diagnosed with a physical exam and X-rays. A CT scan may also be required for complex fractures.  TREATMENT   Stable fractures are treated with a cast or splint and using crutches to avoid putting  weight on your injured ankle. This is followed by an ankle strengthening program. Some patients require a special type of cast, depending on other medical problems they may have. Unstable fractures require surgery to ensure the bones heal properly. Your health care provider will tell you what type of fracture you have and the best treatment for your condition.  HOME CARE INSTRUCTIONS    Review correct crutch use with your health care provider and use your crutches as directed. Safe use of crutches is extremely important. Misuse of crutches can cause you to fall or cause injury to nerves in your hands or armpits.   Do not put weight or pressure on the injured ankle until directed by your health care provider.   To lessen the swelling, keep the injured leg elevated while sitting or lying down.   Apply ice to the injured area:   Put ice in a plastic bag.   Place a towel between your cast and the bag.   Leave the ice on for 20 minutes, 2-3 times a day.   If you have a plaster or fiberglass cast:   Do not try to scratch the skin under the cast with any objects. This can increase your risk of skin infection.   Check the skin around the cast every day. You   may put lotion on any red or sore areas.   Keep your cast dry and clean.   If you have a plaster splint:   Wear the splint as directed.   You may loosen the elastic around the splint if your toes become numb, tingle, or turn cold or blue.   Do not put pressure on any part of your cast or splint; it may break. Rest your cast only on a pillow the first 24 hours until it is fully hardened.   Your cast or splint can be protected during bathing with a plastic bag sealed to your skin with medical tape. Do not lower the cast or splint into water.   Take medicines as directed by your health care provider. Only take over-the-counter or prescription medicines for pain, discomfort, or fever as directed by your health care provider.   Do not drive a vehicle until  your health care provider specifically tells you it is safe to do so.   If your health care provider has given you a follow-up appointment, it is very important to keep that appointment. Not keeping the appointment could result in a chronic or permanent injury, pain, and disability. If you have any problem keeping the appointment, call the facility for assistance.  SEEK MEDICAL CARE IF:  You develop increased swelling or discomfort.  SEEK IMMEDIATE MEDICAL CARE IF:    Your cast gets damaged or breaks.   You have continued severe pain.   You develop new pain or swelling after the cast was put on.   Your skin or toenails below the injury turn blue or gray.   Your skin or toenails below the injury feel cold, numb, or have loss of sensitivity to touch.   There is a bad smell or pus draining from under the cast.  MAKE SURE YOU:    Understand these instructions.   Will watch your condition.   Will get help right away if you are not doing well or get worse.  Document Released: 09/13/2000 Document Revised: 09/21/2013 Document Reviewed: 04/15/2013  [employer] Patient Information 2015 [employer]. This information is not intended to replace advice given to you by your health care provider. Make sure you discuss any questions you have with your health care provider.

## 2014-11-14 NOTE — Progress Notes (Signed)
Orthopedic Tech Progress Note Patient Details:  Kayla [name] January 27, 1961 [id number]  Ortho Devices Type of Ortho Device: Ace wrap, Post (short leg) splint, Stirrup splint Ortho Device/Splint Location: LLE Ortho Device/Splint Interventions: Ordered, Application   Braulio Bosch 11/14/2014, 9:18 PM

## 2014-11-18 ENCOUNTER — Encounter (HOSPITAL_BASED_OUTPATIENT_CLINIC_OR_DEPARTMENT_OTHER): Payer: Self-pay | Admitting: *Deleted

## 2014-11-18 NOTE — Progress Notes (Signed)
Was in er 11/14/14-labs and ekg done-pt has never had a sleep study, but snores and sats drop post op

## 2014-11-21 ENCOUNTER — Ambulatory Visit (HOSPITAL_COMMUNITY): Payer: Worker's Compensation

## 2014-11-21 ENCOUNTER — Ambulatory Visit (HOSPITAL_BASED_OUTPATIENT_CLINIC_OR_DEPARTMENT_OTHER)
Admission: RE | Admit: 2014-11-21 | Discharge: 2014-11-21 | Disposition: A | Discharge status: Home / Self Care | Payer: Worker's Compensation | Source: Ambulatory Visit | Attending: Orthopedic Surgery | Admitting: Orthopedic Surgery

## 2014-11-21 ENCOUNTER — Encounter (HOSPITAL_BASED_OUTPATIENT_CLINIC_OR_DEPARTMENT_OTHER)
Admission: RE | Disposition: A | Discharge status: Home / Self Care | Payer: Self-pay | Source: Ambulatory Visit | Attending: Orthopedic Surgery

## 2014-11-21 ENCOUNTER — Ambulatory Visit (HOSPITAL_BASED_OUTPATIENT_CLINIC_OR_DEPARTMENT_OTHER): Payer: Worker's Compensation | Admitting: Anesthesiology

## 2014-11-21 ENCOUNTER — Encounter (HOSPITAL_BASED_OUTPATIENT_CLINIC_OR_DEPARTMENT_OTHER): Payer: Self-pay | Admitting: Anesthesiology

## 2014-11-21 DIAGNOSIS — X58XXXA Exposure to other specified factors, initial encounter: Secondary | ICD-10-CM | POA: Diagnosis not present

## 2014-11-21 DIAGNOSIS — Z882 Allergy status to sulfonamides status: Secondary | ICD-10-CM | POA: Insufficient documentation

## 2014-11-21 DIAGNOSIS — S82852A Displaced trimalleolar fracture of left lower leg, initial encounter for closed fracture: Secondary | ICD-10-CM | POA: Diagnosis present

## 2014-11-21 DIAGNOSIS — E039 Hypothyroidism, unspecified: Secondary | ICD-10-CM | POA: Diagnosis not present

## 2014-11-21 DIAGNOSIS — J42 Unspecified chronic bronchitis: Secondary | ICD-10-CM | POA: Diagnosis not present

## 2014-11-21 DIAGNOSIS — F419 Anxiety disorder, unspecified: Secondary | ICD-10-CM | POA: Diagnosis not present

## 2014-11-21 DIAGNOSIS — D573 Sickle-cell trait: Secondary | ICD-10-CM | POA: Diagnosis not present

## 2014-11-21 DIAGNOSIS — Z794 Long term (current) use of insulin: Secondary | ICD-10-CM | POA: Insufficient documentation

## 2014-11-21 DIAGNOSIS — M797 Fibromyalgia: Secondary | ICD-10-CM | POA: Diagnosis not present

## 2014-11-21 DIAGNOSIS — I10 Essential (primary) hypertension: Secondary | ICD-10-CM | POA: Diagnosis not present

## 2014-11-21 DIAGNOSIS — Z888 Allergy status to other drugs, medicaments and biological substances status: Secondary | ICD-10-CM | POA: Diagnosis not present

## 2014-11-21 DIAGNOSIS — R42 Dizziness and giddiness: Secondary | ICD-10-CM | POA: Insufficient documentation

## 2014-11-21 DIAGNOSIS — S82853A Displaced trimalleolar fracture of unspecified lower leg, initial encounter for closed fracture: Secondary | ICD-10-CM

## 2014-11-21 DIAGNOSIS — J449 Chronic obstructive pulmonary disease, unspecified: Secondary | ICD-10-CM | POA: Diagnosis not present

## 2014-11-21 DIAGNOSIS — Z9049 Acquired absence of other specified parts of digestive tract: Secondary | ICD-10-CM | POA: Insufficient documentation

## 2014-11-21 DIAGNOSIS — Z87891 Personal history of nicotine dependence: Secondary | ICD-10-CM | POA: Diagnosis not present

## 2014-11-21 DIAGNOSIS — M1990 Unspecified osteoarthritis, unspecified site: Secondary | ICD-10-CM | POA: Insufficient documentation

## 2014-11-21 DIAGNOSIS — E785 Hyperlipidemia, unspecified: Secondary | ICD-10-CM | POA: Insufficient documentation

## 2014-11-21 DIAGNOSIS — E0500 Thyrotoxicosis with diffuse goiter without thyrotoxic crisis or storm: Secondary | ICD-10-CM | POA: Diagnosis not present

## 2014-11-21 DIAGNOSIS — E1142 Type 2 diabetes mellitus with diabetic polyneuropathy: Secondary | ICD-10-CM | POA: Diagnosis not present

## 2014-11-21 HISTORY — DX: Displaced trimalleolar fracture of left lower leg, initial encounter for closed fracture: S82.852A

## 2014-11-21 HISTORY — DX: Other complications of anesthesia, initial encounter: T88.59XA

## 2014-11-21 HISTORY — DX: Adverse effect of unspecified anesthetic, initial encounter: T41.45XA

## 2014-11-21 HISTORY — DX: Presence of spectacles and contact lenses: Z97.3

## 2014-11-21 HISTORY — PX: ORIF ANKLE FRACTURE: SHX5408

## 2014-11-21 LAB — POCT HEMOGLOBIN-HEMACUE: Hemoglobin: 11.9 g/dL — ABNORMAL LOW (ref 12.0–15.0)

## 2014-11-21 LAB — GLUCOSE, CAPILLARY
Glucose-Capillary: 181 mg/dL — ABNORMAL HIGH (ref 70–99)
Glucose-Capillary: 151 mg/dL — ABNORMAL HIGH (ref 70–99)

## 2014-11-21 SURGERY — OPEN REDUCTION INTERNAL FIXATION (ORIF) ANKLE FRACTURE
Anesthesia: General | Site: Ankle | Laterality: Left

## 2014-11-21 MED ORDER — ONDANSETRON HCL 4 MG PO TABS: 4.0000 mg | ORAL_TABLET | Freq: Three times a day (TID) | ORAL | Status: DC | PRN

## 2014-11-21 MED ORDER — CEFAZOLIN SODIUM-DEXTROSE 2-3 GM-% IV SOLR
2.0000 g | INTRAVENOUS | Status: AC
  Administered 2014-11-21: 2 g via INTRAVENOUS

## 2014-11-21 MED ORDER — PROMETHAZINE HCL 25 MG/ML IJ SOLN: 6.2500 mg | INTRAMUSCULAR | Status: DC | PRN

## 2014-11-21 MED ORDER — EPHEDRINE SULFATE 50 MG/ML IJ SOLN
INTRAMUSCULAR | Status: DC | PRN
  Administered 2014-11-21: 5 mg via INTRAVENOUS
  Administered 2014-11-21 (×3): 10 mg via INTRAVENOUS

## 2014-11-21 MED ORDER — MIDAZOLAM HCL 2 MG/2ML IJ SOLN
1.0000 mg | INTRAMUSCULAR | Status: DC | PRN
  Administered 2014-11-21: 2 mg via INTRAVENOUS

## 2014-11-21 MED ORDER — HYDROMORPHONE HCL 1 MG/ML IJ SOLN
INTRAMUSCULAR | Status: AC
  Filled 2014-11-21: qty 1

## 2014-11-21 MED ORDER — MIDAZOLAM HCL 2 MG/2ML IJ SOLN
INTRAMUSCULAR | Status: AC
  Filled 2014-11-21: qty 2

## 2014-11-21 MED ORDER — ONDANSETRON HCL 4 MG/2ML IJ SOLN
INTRAMUSCULAR | Status: DC | PRN
  Administered 2014-11-21: 4 mg via INTRAVENOUS

## 2014-11-21 MED ORDER — ROPIVACAINE HCL 5 MG/ML IJ SOLN
INTRAMUSCULAR | Status: DC | PRN
  Administered 2014-11-21: 25 mL via PERINEURAL
  Administered 2014-11-21: 20 mL via PERINEURAL

## 2014-11-21 MED ORDER — OXYCODONE-ACETAMINOPHEN 10-325 MG PO TABS: 1.0000 | ORAL_TABLET | Freq: Four times a day (QID) | ORAL | Status: DC | PRN

## 2014-11-21 MED ORDER — HYDROMORPHONE HCL 1 MG/ML IJ SOLN
0.5000 mg | INTRAMUSCULAR | Status: DC | PRN
  Administered 2014-11-21 (×3): 0.5 mg via INTRAVENOUS

## 2014-11-21 MED ORDER — KETOROLAC TROMETHAMINE 30 MG/ML IJ SOLN: 30.0000 mg | Freq: Once | INTRAMUSCULAR | Status: DC | PRN

## 2014-11-21 MED ORDER — PROPOFOL 10 MG/ML IV BOLUS
INTRAVENOUS | Status: DC | PRN
  Administered 2014-11-21: 160 mg via INTRAVENOUS

## 2014-11-21 MED ORDER — CEFAZOLIN SODIUM-DEXTROSE 2-3 GM-% IV SOLR
INTRAVENOUS | Status: AC
  Filled 2014-11-21: qty 50

## 2014-11-21 MED ORDER — LACTATED RINGERS IV SOLN
INTRAVENOUS | Status: DC
  Administered 2014-11-21 (×2): via INTRAVENOUS

## 2014-11-21 MED ORDER — MIDAZOLAM HCL 5 MG/5ML IJ SOLN
INTRAMUSCULAR | Status: DC | PRN
  Administered 2014-11-21: 2 mg via INTRAVENOUS

## 2014-11-21 MED ORDER — SENNOSIDES-DOCUSATE SODIUM 8.6-50 MG PO TABS: 1.0000 | ORAL_TABLET | Freq: Every day | ORAL | Status: DC

## 2014-11-21 MED ORDER — FENTANYL CITRATE 0.05 MG/ML IJ SOLN
INTRAMUSCULAR | Status: AC
  Filled 2014-11-21: qty 2

## 2014-11-21 MED ORDER — BACLOFEN 10 MG PO TABS: 10.0000 mg | ORAL_TABLET | Freq: Three times a day (TID) | ORAL | Status: DC

## 2014-11-21 MED ORDER — FENTANYL CITRATE 0.05 MG/ML IJ SOLN
INTRAMUSCULAR | Status: AC
  Filled 2014-11-21: qty 6

## 2014-11-21 MED ORDER — FENTANYL CITRATE 0.05 MG/ML IJ SOLN
50.0000 ug | INTRAMUSCULAR | Status: DC | PRN
  Administered 2014-11-21: 100 ug via INTRAVENOUS

## 2014-11-21 MED ORDER — FENTANYL CITRATE 0.05 MG/ML IJ SOLN
INTRAMUSCULAR | Status: DC | PRN
  Administered 2014-11-21 (×2): 50 ug via INTRAVENOUS

## 2014-11-21 MED ORDER — FENTANYL CITRATE 0.05 MG/ML IJ SOLN: 25.0000 ug | INTRAMUSCULAR | Status: DC | PRN

## 2014-11-21 MED ORDER — LIDOCAINE HCL (CARDIAC) 20 MG/ML IV SOLN
INTRAVENOUS | Status: DC | PRN
  Administered 2014-11-21: 80 mg via INTRAVENOUS

## 2014-11-21 SURGICAL SUPPLY — 88 items
BANDAGE ELASTIC 4 VELCRO ST LF (GAUZE/BANDAGES/DRESSINGS) ×3 IMPLANT
BANDAGE ELASTIC 6 VELCRO ST LF (GAUZE/BANDAGES/DRESSINGS) ×3 IMPLANT
BANDAGE ESMARK 6X9 LF (GAUZE/BANDAGES/DRESSINGS) ×1 IMPLANT
BIT DRILL 2.5X110 QC LCP DISP (BIT) ×2 IMPLANT
BIT DRILL CANN 2.7X625 NONSTRL (BIT) ×2 IMPLANT
BIT DRILL QC 3.5X110 (BIT) ×2 IMPLANT
BLADE SURG 15 STRL LF DISP TIS (BLADE) ×3 IMPLANT
BLADE SURG 15 STRL SS (BLADE) ×9
BNDG CMPR 9X6 STRL LF SNTH (GAUZE/BANDAGES/DRESSINGS) ×1
BNDG COHESIVE 4X5 TAN STRL (GAUZE/BANDAGES/DRESSINGS) ×3 IMPLANT
BNDG ESMARK 6X9 LF (GAUZE/BANDAGES/DRESSINGS) ×3
CANISTER SUCT 1200ML W/VALVE (MISCELLANEOUS) ×3 IMPLANT
CLOSURE STERI-STRIP 1/2X4 (GAUZE/BANDAGES/DRESSINGS) ×1
CLSR STERI-STRIP ANTIMIC 1/2X4 (GAUZE/BANDAGES/DRESSINGS) ×1 IMPLANT
COVER BACK TABLE 60X90IN (DRAPES) ×3 IMPLANT
COVER MAYO STAND STRL (DRAPES) ×2 IMPLANT
CUFF TOURNIQUET SINGLE 34IN LL (TOURNIQUET CUFF) ×2 IMPLANT
DECANTER SPIKE VIAL GLASS SM (MISCELLANEOUS) IMPLANT
DRAPE C-ARM 42X72 X-RAY (DRAPES) ×2 IMPLANT
DRAPE C-ARMOR (DRAPES) ×2 IMPLANT
DRAPE EXTREMITY T 121X128X90 (DRAPE) ×3 IMPLANT
DRAPE INCISE IOBAN 66X45 STRL (DRAPES) ×1 IMPLANT
DRAPE OEC MINIVIEW 54X84 (DRAPES) IMPLANT
DRAPE U 20/CS (DRAPES) ×3 IMPLANT
DRAPE U-SHAPE 47X51 STRL (DRAPES) ×3 IMPLANT
DRSG PAD ABDOMINAL 8X10 ST (GAUZE/BANDAGES/DRESSINGS) ×3 IMPLANT
DURAPREP 26ML APPLICATOR (WOUND CARE) ×3 IMPLANT
ELECT REM PT RETURN 9FT ADLT (ELECTROSURGICAL) ×3
ELECTRODE REM PT RTRN 9FT ADLT (ELECTROSURGICAL) ×1 IMPLANT
GAUZE SPONGE 4X4 12PLY STRL (GAUZE/BANDAGES/DRESSINGS) ×3 IMPLANT
GLOVE BIO SURGEON STRL SZ 6.5 (GLOVE) ×3 IMPLANT
GLOVE BIO SURGEON STRL SZ8 (GLOVE) ×3 IMPLANT
GLOVE BIO SURGEONS STRL SZ 6.5 (GLOVE) ×3
GLOVE BIOGEL M STRL SZ7.5 (GLOVE) ×2 IMPLANT
GLOVE BIOGEL PI IND STRL 7.0 (GLOVE) IMPLANT
GLOVE BIOGEL PI IND STRL 8 (GLOVE) ×2 IMPLANT
GLOVE BIOGEL PI INDICATOR 7.0 (GLOVE) ×4
GLOVE BIOGEL PI INDICATOR 8 (GLOVE) ×6
GLOVE ORTHO TXT STRL SZ7.5 (GLOVE) ×3 IMPLANT
GOWN STRL REUS W/ TWL LRG LVL3 (GOWN DISPOSABLE) ×1 IMPLANT
GOWN STRL REUS W/ TWL XL LVL3 (GOWN DISPOSABLE) ×2 IMPLANT
GOWN STRL REUS W/TWL LRG LVL3 (GOWN DISPOSABLE) ×3
GOWN STRL REUS W/TWL XL LVL3 (GOWN DISPOSABLE) ×9
K-WIRE 1.25 TRCR POINT 150 (WIRE) ×9
KIT 1/3 TUB PL 8H 97M (Orthopedic Implant) IMPLANT
KWIRE 1.25 TRCR POINT 150 (WIRE) IMPLANT
NDL HYPO 25X1 1.5 SAFETY (NEEDLE) IMPLANT
NEEDLE HYPO 25X1 1.5 SAFETY (NEEDLE) IMPLANT
NS IRRIG 1000ML POUR BTL (IV SOLUTION) ×3 IMPLANT
PACK BASIN DAY SURGERY FS (CUSTOM PROCEDURE TRAY) ×3 IMPLANT
PAD CAST 4YDX4 CTTN HI CHSV (CAST SUPPLIES) ×2 IMPLANT
PADDING CAST COTTON 4X4 STRL (CAST SUPPLIES) ×9
PENCIL BUTTON HOLSTER BLD 10FT (ELECTRODE) ×3 IMPLANT
PROS 1/3 TUB PL 8H 97M (Orthopedic Implant) ×3 IMPLANT
SCREW CANC FT 12 4.0 (Screw) ×2 IMPLANT
SCREW CANC FT ST SFS 4X14 (Screw) ×2 IMPLANT
SCREW CANN L THRD/40 4.0 (Screw) ×2 IMPLANT
SCREW CANN S THRD/36 4.0 (Screw) ×2 IMPLANT
SCREW CORTEX 3.5 12MM (Screw) ×8 IMPLANT
SCREW LOCK CORT ST 3.5X12 (Screw) IMPLANT
SCREW SHORT THREAD 4.0X32 (Screw) ×2 IMPLANT
SHEET MEDIUM DRAPE 40X70 STRL (DRAPES) ×2 IMPLANT
SLEEVE SCD COMPRESS KNEE MED (MISCELLANEOUS) ×3 IMPLANT
SPLINT FAST PLASTER 5X30 (CAST SUPPLIES) ×40
SPLINT PLASTER CAST FAST 5X30 (CAST SUPPLIES) IMPLANT
SPLINT PLASTER CAST XFAST 3X15 (CAST SUPPLIES) IMPLANT
SPLINT PLASTER XTRA FASTSET 3X (CAST SUPPLIES) ×2
SPONGE LAP 4X18 X RAY DECT (DISPOSABLE) ×3 IMPLANT
STAPLER VISISTAT 35W (STAPLE) IMPLANT
SUCTION FRAZIER TIP 10 FR DISP (SUCTIONS) ×3 IMPLANT
SUT ETHILON 3 0 PS 1 (SUTURE) IMPLANT
SUT ETHILON 4 0 PS 2 18 (SUTURE) IMPLANT
SUT FIBERWIRE 2-0 18 17.9 3/8 (SUTURE) ×3
SUT MNCRL AB 4-0 PS2 18 (SUTURE) IMPLANT
SUT VIC AB 0 CT1 27 (SUTURE) ×6
SUT VIC AB 0 CT1 27XBRD ANBCTR (SUTURE) IMPLANT
SUT VIC AB 2-0 SH 18 (SUTURE) IMPLANT
SUT VIC AB 3-0 SH 27 (SUTURE)
SUT VIC AB 3-0 SH 27X BRD (SUTURE) IMPLANT
SUT VICRYL 3-0 CR8 SH (SUTURE) ×2 IMPLANT
SUTURE FIBERWR 2-0 18 17.9 3/8 (SUTURE) IMPLANT
SYR BULB 3OZ (MISCELLANEOUS) ×3 IMPLANT
SYR CONTROL 10ML LL (SYRINGE) IMPLANT
TUBE CONNECTING 20'X1/4 (TUBING) ×1
TUBE CONNECTING 20X1/4 (TUBING) ×2 IMPLANT
UNDERPAD 30X30 INCONTINENT (UNDERPADS AND DIAPERS) ×3 IMPLANT
WASHER 7MM DIA (Washer) ×2 IMPLANT
YANKAUER SUCT BULB TIP NO VENT (SUCTIONS) ×1 IMPLANT

## 2014-11-21 NOTE — Transfer of Care (Signed)
Immediate Anesthesia Transfer of Care Note  Patient: Kayla [name]  Procedure(s) Performed: Procedure(s): OPEN REDUCTION INTERNAL FIXATION (ORIF) TRI MALLEOLAR FRACTURE with retinacular repair (Left)  Patient Location: PACU  Anesthesia Type:GA combined with regional for post-op pain  Level of Consciousness: awake, alert , oriented and patient cooperative  Airway & Oxygen Therapy: Patient Spontanous Breathing and Patient connected to face mask oxygen  Post-op Assessment: Report given to RN and Post -op Vital signs reviewed and stable  Post vital signs: Reviewed and stable  Last Vitals:  Filed Vitals:   11/21/14 1704  BP:   Pulse: 73  Temp:   Resp:     Complications: No apparent anesthesia complications

## 2014-11-21 NOTE — Op Note (Signed)
11/21/2014  PATIENT:  Kayla [name]    PRE-OPERATIVE DIAGNOSIS:  Left trimalleolar ankle fracture dislocation  POST-OPERATIVE DIAGNOSIS:  Left trimalleolar ankle fracture fracture dislocation with disruption of posterior tibial tendon stabilizing retinaculum  PROCEDURE:  OPEN REDUCTION INTERNAL FIXATION (ORIF) TRIMALLEOLAR FRACTURE with fixation of the posterior lip with repair of posterior tibial tendon retinaculum  SURGEON:  Johnny Bridge, MD  PHYSICIAN ASSISTANT: Joya Gaskins, OPA-C, present and scrubbed throughout the case, critical for completion in a timely fashion, and for retraction, instrumentation, and closure.  ANESTHESIA:   General  PREOPERATIVE INDICATIONS:  Kayla [name] is a  54 y.o. female who had a work-related injury with a left ankle fracture dislocation. She underwent emergent closed reduction done in the emergency room, and then elected for definitive surgical management.  The risks benefits and alternatives were discussed with the patient preoperatively including but not limited to the risks of infection, bleeding, nerve injury, cardiopulmonary complications, the need for revision surgery, the need for hardware removal, among others, and the patient was willing to proceed.  OPERATIVE IMPLANTS: 1/3 tubular plate, size 8 hole with 3 proximal cortical screws, 2 distal cancellous screws, one interfragmentary lag screw, a cerclage 0 Vicryl across two segmental pieces of fibula fracture, with a single 4.0 mm cannulated screw with a washer for the small medial malleolus piece, and then a second 4.0 mm cannulated screw for the posterior malleolus.  OPERATIVE PROCEDURE: The patient was brought to the operating room and placed in the supine position. All bony prominences were padded. General anesthesia was administered. The lower extremity was prepped and draped in the usual sterile fashion. The leg was elevated and exsanguinated and the tourniquet was inflated. Time out was  performed.   Incision was made over the distal fibula and the fracture was exposed and reduced anatomically with a clamp. This was an extremely comminuted fracture, with spiral oblique segmental components, and at least 4 if not 5 pieces of the fibula. I lagged the proximal piece to the spiral oblique piece restoring length. I then placed an anterior fragment of bone across the anterior half of the fibula, reduced to the posterior segment, and then used a cerclage Vicryl suture to hold it together. The anterior fragment was too thin for a screw.  I then reduced the distal segment to the proximal 4 pieces, and applied a 1/3 tubular locking plate and secured it proximally and distally with non-locking screws. The distal 2 screws were cancellus screws, and actually had reasonably good bite. The fracture pattern was such that I would not have gotten a third screw if I placed the plate any further distal, and therefore I placed the plate in appropriate location on the fibula in order to optimize the bone fixation distally with 2 screws. Bone quality was mediocre. I used c-arm to confirm satisfactory reduction and fixation.   I then turned my attention to the medial malleolus. Incision was made over the medial malleolus and the fracture exposed and held provisionally with a clamp. There was a substantial amount of injury to the peroneal retinaculum, and it was visualized to be completely torn, leaving the posterior tibial tendon destabilized. The tendon itself was intact. I used a total of 3 figure-of-eight sutures using a 2-0 FiberWire to repair the retinaculum.  I then used a single 4.0 mm cannulated screw to secure the medial malleolus piece, which was fairly small and large enough to accommodate a single screw. For this reason I did place a washer. Excellent reduction of  the medial malleolus was achieved.  The syndesmosis was stressed using live fluoroscopy and found to be stable.   I then assessed the  posterior malleolus, and it was roughly 20% of the posterior tibia at the lateral aspect, and sizable enough and still displaced enough that I felt that fixation would optimize her long-term recovery and minimize posttraumatic arthritis, particularly given the fact she is diabetic, and stabilization of the bony pieces would hopefully optimize the potential for successful union, given her risk for delayed healing.  I used a freer elevator from the posterior aspect just behind the fibula to compress and reduce the posterior fragment as much as possible. I made a small incision over the anterior ankle, and dissected bluntly down, and used a tissue protector to make sure that I did not ensnare any of the neurovascular or tendinous structures anteriorly. I placed a guidepin from front to back, into the appropriate location securing the posterior lateral piece. I measured, initially tried with a 32 but ended up having to place a 36 screw in order to get full fixation. I opened the anterior cortex drill. Screws placed and had excellent fixation, and I didn't feel confident that I had secured the posterior piece. The screw was not prominent posteriorly, but it did need to be bicortical in order to gain adequate fixation. I then irrigated the wounds copiously, repaired the deep tissue with Vicryl, followed by Vicryl for the subcutaneous tissue with routine closure for the skin and Steri-Strips and sterile gauze. A posterior splint was applied. She was awakened and returned to the PACU in stable and satisfactory condition. There were no complications and she tolerated the procedure well.

## 2014-11-21 NOTE — Progress Notes (Signed)
Assisted Dr. Rose with left, ultrasound guided, popliteal block. Side rails up, monitors on throughout procedure. See vital signs in flow sheet. Tolerated Procedure well. 

## 2014-11-21 NOTE — Anesthesia Postprocedure Evaluation (Signed)
Anesthesia Post Note  Patient: Kayla [name]  Procedure(s) Performed: Procedure(s) (LRB): OPEN REDUCTION INTERNAL FIXATION (ORIF) TRI MALLEOLAR FRACTURE with retinacular repair (Left)  Anesthesia type: General  Patient location: PACU  Post pain: Pain level controlled  Post assessment: Post-op Vital signs reviewed  Last Vitals: BP 140/71 mmHg  Pulse 78  Temp(Src) 36.8 C (Oral)  Resp 16  Ht 5\' 2"  (1.575 m)  Wt 176 lb (79.833 kg)  BMI 32.18 kg/m2  SpO2 100%  LMP 01/12/2014  Post vital signs: Reviewed  Level of consciousness: sedated  Complications: No apparent anesthesia complications

## 2014-11-21 NOTE — Discharge Instructions (Signed)
Diet: As you were doing prior to hospitalization   Shower:  May shower but keep the wounds dry, use an occlusive plastic wrap, NO SOAKING IN TUB.  If the bandage gets wet, change with a clean dry gauze.  Dressing:  You may change your dressing 3-5 days after surgery.  Then change the dressing daily with sterile gauze dressing.    There are sticky tapes (steri-strips) on your wounds and all the stitches are absorbable.  Leave the steri-strips in place when changing your dressings, they will peel off with time, usually 2-3 weeks.  Activity:  Increase activity slowly as tolerated, but follow the weight bearing instructions below.  No lifting or driving for 6 weeks.  Weight Bearing:   No weight on left foot.    To prevent constipation: you may use a stool softener such as -  Colace (over the counter) 100 mg by mouth twice a day  Drink plenty of fluids (prune juice may be helpful) and high fiber foods Miralax (over the counter) for constipation as needed.    Itching:  If you experience itching with your medications, try taking only a single pain pill, or even half a pain pill at a time.  You may take up to 10 pain pills per day, and you can also use benadryl over the counter for itching or also to help with sleep.   Precautions:  If you experience chest pain or shortness of breath - call 911 immediately for transfer to the hospital emergency department!!  If you develop a fever greater that 101 F, purulent drainage from wound, increased redness or drainage from wound, or calf pain -- Call the office at [phone]                                                Follow- Up Appointment:  Please call for an appointment to be seen in 2 weeks [place] - [phone]  Regional Anesthesia Blocks  1. Numbness or the inability to move the "blocked" extremity may last from 3-48 hours after placement. The length of time depends on the medication injected and your individual response to the medication.  If the numbness is not going away after 48 hours, call your surgeon.  2. The extremity that is blocked will need to be protected until the numbness is gone and the  Strength has returned. Because you cannot feel it, you will need to take extra care to avoid injury. Because it may be weak, you may have difficulty moving it or using it. You may not know what position it is in without looking at it while the block is in effect.  3. For blocks in the legs and feet, returning to weight bearing and walking needs to be done carefully. You will need to wait until the numbness is entirely gone and the strength has returned. You should be able to move your leg and foot normally before you try and bear weight or walk. You will need someone to be with you when you first try to ensure you do not fall and possibly risk injury.  4. Bruising and tenderness at the needle site are common side effects and will resolve in a few days.  5. Persistent numbness or new problems with movement should be communicated to the surgeon or the [place] [phone] [facility] (  [phone]).    Post Anesthesia Home Care Instructions  Activity: Get plenty of rest for the remainder of the day. A responsible adult should stay with you for 24 hours following the procedure.  For the next 24 hours, DO NOT: -Drive a car -Paediatric nurse -Drink alcoholic beverages -Take any medication unless instructed by your physician -Make any legal decisions or sign important papers.  Meals: Start with liquid foods such as gelatin or soup. Progress to regular foods as tolerated. Avoid greasy, spicy, heavy foods. If nausea and/or vomiting occur, drink only clear liquids until the nausea and/or vomiting subsides. Call your physician if vomiting continues.  Special Instructions/Symptoms: Your throat may feel dry or sore from the anesthesia or the breathing tube placed in your throat during surgery. If this  causes discomfort, gargle with warm salt water. The discomfort should disappear within 24 hours.

## 2014-11-21 NOTE — Anesthesia Procedure Notes (Addendum)
Anesthesia Regional Block:  Popliteal block  Pre-Anesthetic Checklist: ,, timeout performed, Correct Patient, Correct Site, Correct Laterality, Correct Procedure, Correct Position, site marked, Risks and benefits discussed,  Surgical consent,  Pre-op evaluation,  At surgeon's request and post-op pain management  Laterality: Left  Prep: chloraprep       Needles:  Injection technique: Single-shot  Needle Type: Echogenic Stimulator Needle     Needle Length: 9cm 9 cm Needle Gauge: 21 and 21 G    Additional Needles:  Procedures: ultrasound guided (picture in chart) Popliteal block Narrative:  Injection made incrementally with aspirations every 5 mL.  Performed by: Personally   Additional Notes: Patient tolerated the procedure well without complications   Procedure Name: LMA Insertion Date/Time: 11/21/2014 2:28 PM Performed by: Lyndee Leo Pre-anesthesia Checklist: Patient identified, Emergency Drugs available, Suction available and Patient being monitored Patient Re-evaluated:Patient Re-evaluated prior to inductionOxygen Delivery Method: Circle System Utilized Preoxygenation: Pre-oxygenation with 100% oxygen Intubation Type: IV induction Ventilation: Mask ventilation without difficulty LMA: LMA inserted LMA Size: 4.0 Number of attempts: 1 Airway Equipment and Method: Bite block Placement Confirmation: positive ETCO2 Tube secured with: Tape Dental Injury: Teeth and Oropharynx as per pre-operative assessment     Anesthesia Regional Block:  Adductor canal block  Pre-Anesthetic Checklist: ,, timeout performed, Correct Patient, Correct Site, Correct Laterality, Correct Procedure, Correct Position, site marked, Risks and benefits discussed, Surgical consent,  Pre-op evaluation,  Post-op pain management  Laterality: Left  Prep: chloraprep       Needles:  Injection technique: Single-shot  Needle Type: Stimiplex     Needle Length: 10cm 10 cm Needle Gauge: 21 and  21 G    Additional Needles:  Procedures: ultrasound guided (picture in chart)  Motor weakness within 5 minutes. Adductor canal block Narrative:  Injection made incrementally with aspirations every 5 mL.  Performed by: Personally  Anesthesiologist: Nolon Nations R  Additional Notes: Good perineural spread. Good pain relief

## 2014-11-21 NOTE — H&P (Signed)
PREOPERATIVE H&P  Chief Complaint: Left ankle pain   HPI: Kayla [name] is a 54 y.o. female who presents for preoperative history and physical with a diagnosis of LEFT ANKLE FRACTURE . Symptoms are rated as moderate to severe, and have been worsening.  This is significantly impairing activities of daily living.  She has elected for surgical management. This occurred after she slipped on ice at work. She had an ankle fracture dislocation, and had closed reduction performed in the emergency room, then subsequently seen in the office, and skin soft tissue swelling was adequate, and she has now elected for surgical management.  Past Medical History  Diagnosis Date  . ANA POSITIVE 02/09/2010  . Hyperlipidemia   . Dizziness     Multiple falls at home  . Sickle cell trait   . Fibromyalgia   . Hypothyroidism   . 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 Anal warts 11/09/2012  . Hypertension   . Chronic bronchitis     "get it changing of the seasons; sometimes q yr; sometimes not"   . COPD (chronic obstructive pulmonary disease)   . Graves' disease     "they nuked it twice"  . BJSEGBTD(176.1)     "monthly" (06/27/2014)  . Arthritis     "my bones ache all the time" (06/27/2014)  . Type 2 diabetes mellitus   . Diabetic peripheral neuropathy since <2005    "hands and feet"  . Anxiety   . Cellulitis 06/27/2014    medial proximal left thigh extending to the perineium  . Heart murmur     as a child-never had echo to evaluate  . Wears glasses   . Complication of anesthesia     sats drop post op   Past Surgical History  Procedure Laterality Date  . Appendectomy  1986  . Cesarean section  1986, 1993  . Dilation and curettage of uterus  1990's  . Ankle fracture surgery Left 2008    "didn't put hardware in"  . Fracture surgery    . Incision and drainage abscess Left 07/01/2014    Procedure: INCISION AND DRAINAGE ABSCESS OF LEFT THIGH;  Surgeon: Georganna Skeans, MD;  Location:  [facility];  Service: General;  Laterality: Left;   History   Social History  . Marital Status: Single    Spouse Name: N/A  . Number of Children: N/A  . Years of Education: 12   Occupational History  .     Social History Main Topics  . Smoking status: Former Smoker -- 0.75 packs/day for 5 years    Types: Cigarettes    Quit date: 09/30/1990  . Smokeless tobacco: Never Used  . Alcohol Use: No  . Drug Use: No  . Sexual Activity: Not Currently   Other Topics Concern  . None   Social History Narrative   Lives in [place], alone. Looking for a job. Has a son in town who checks in on her.    Family History  Problem Relation Age of Onset  . Emphysema Mother   . Cancer Mother     unknown  . Hypertension Mother   . Cancer Father     pancreatic   . Diabetes Father   . Hypertension Father   . Down syndrome Brother   . Cancer Maternal Aunt     cancer  . Cancer Other     breast cancer aunt  . Stroke Sister   . Diabetes Sister   .  Colon cancer Neg Hx   . Stomach cancer Neg Hx    Allergies  Allergen Reactions  . Ace Inhibitors     Dizziness, nausea  . Percocet [Oxycodone-Acetaminophen] Itching  . Statins   . Sulfamethoxazole Rash  . Sulfites Rash   Prior to Admission medications   Medication Sig Start Date End Date Taking? Authorizing Provider  DULoxetine (CYMBALTA) 30 MG capsule Take 2 capsules (60 mg total) by mouth daily. 11/01/14  Yes Tasrif Ahmed, MD  gabapentin (NEURONTIN) 300 MG capsule Take 2 capsules (600 mg total) by mouth 4 (four) times daily. 03/14/14 03/14/15 Yes Dorian Heckle, MD  HYDROcodone-acetaminophen (NORCO/VICODIN) 5-325 MG per tablet Take 2 tablets by mouth every 4 (four) hours as needed for moderate pain or severe pain. 11/14/14  Yes Sharyon Cable, MD  insulin aspart (NOVOLOG) 100 UNIT/ML injection Inject 3-5 Units into the skin 3 (three) times daily before meals. 08/17/14  Yes Tasrif Ahmed, MD  insulin glargine (LANTUS) 100 UNIT/ML injection Inject  0.4 mLs (40 Units total) into the skin daily. Patient taking differently: Inject 40 Units into the skin at bedtime.  07/06/14  Yes Tasrif Ahmed, MD  levothyroxine (SYNTHROID, LEVOTHROID) 150 MCG tablet Take 1 tablet (150 mcg total) by mouth daily before breakfast. 11/01/14  Yes Tasrif Ahmed, MD  olmesartan (BENICAR) 40 MG tablet Take 40 mg by mouth daily as needed. Patient only takes if she feels her blood pressure is high, will have daughter check if over 130 patient will take medication.   Yes Historical Provider, MD  senna-docusate (SENOKOT-S) 8.6-50 MG per tablet Take 1 tablet by mouth at bedtime. 07/06/14  Yes Tasrif Ahmed, MD  zolpidem (AMBIEN) 5 MG tablet TAKE 1 TABLET BY MOUTH AT BEDTIME AS NEEDED 11/07/14  Yes Tasrif Ahmed, MD  ACCU-CHEK FASTCLIX LANCETS MISC 1 each by Does not apply route 4 (four) times daily -  before meals and at bedtime. 05/10/11   Pedro Earls, MD  Blood Glucose Monitoring Suppl (ACCU-CHEK AVIVA PLUS) W/DEVICE KIT 1 kit by Does not apply route as directed. 05/15/11   Bartholomew Crews, MD  Blood Glucose Monitoring Suppl (ACCU-CHEK NANO SMARTVIEW) W/DEVICE KIT 1 each by Does not apply route 4 (four) times daily -  before meals and at bedtime. 05/10/11   Pedro Earls, MD  guaiFENesin-dextromethorphan (ROBITUSSIN DM) 100-10 MG/5ML syrup Take 5 mLs by mouth every 4 (four) hours as needed for cough. 02/23/14   Malena Catholic, MD  polyethylene glycol (MIRALAX / GLYCOLAX) packet Take 17 g by mouth daily. 07/06/14   Tasrif Ahmed, MD     Positive ROS: All other systems have been reviewed and were otherwise negative with the exception of those mentioned in the HPI and as above.  Physical Exam: General: Alert, no acute distress Cardiovascular: No pedal edema Respiratory: No cyanosis, no use of accessory musculature GI: No organomegaly, abdomen is soft and non-tender Skin: No lesions in the area of chief complaint Neurologic: Sensation intact distally Psychiatric: Patient  is competent for consent with normal mood and affect Lymphatic: No axillary or cervical lymphadenopathy  MUSCULOSKELETAL: Left ankle has significant soft tissue swelling still, but no significant blistering around the incision sites. Sensations intact throughout toes.  Assessment: LEFT ANKLE FRACTURE dislocation, trimalleolar  Plan: Plan for Procedure(s): OPEN REDUCTION INTERNAL FIXATION (ORIF) TRI MALLEOLAR FRACTURE  The risks benefits and alternatives were discussed with the patient including but not limited to the risks of nonoperative treatment, versus surgical intervention including infection, bleeding, nerve injury, malunion,  nonunion, the need for revision surgery, hardware prominence, hardware failure, the need for hardware removal, blood clots, cardiopulmonary complications, morbidity, mortality, among others, and they were willing to proceed.     Johnny Bridge, MD Cell [phone]   11/21/2014 2:18 PM

## 2014-11-21 NOTE — Anesthesia Preprocedure Evaluation (Addendum)
Anesthesia Evaluation  Patient identified by MRN, date of birth, ID band Patient awake    Reviewed: Allergy & Precautions, NPO status , Patient's Chart, lab work & pertinent test results  Airway Mallampati: II  TM Distance: >3 FB Neck ROM: Full    Dental no notable dental hx.    Pulmonary COPDformer smoker,  breath sounds clear to auscultation  Pulmonary exam normal       Cardiovascular hypertension, Pt. on medications Rhythm:Regular Rate:Normal     Neuro/Psych negative neurological ROS  negative psych ROS   GI/Hepatic negative GI ROS, Neg liver ROS,   Endo/Other  diabetes, Insulin DependentHypothyroidism   Renal/GU negative Renal ROS  negative genitourinary   Musculoskeletal negative musculoskeletal ROS (+)   Abdominal   Peds negative pediatric ROS (+)  Hematology negative hematology ROS (+) Sickle cell trait   Anesthesia Other Findings   Reproductive/Obstetrics negative OB ROS                            Anesthesia Physical Anesthesia Plan  ASA: III  Anesthesia Plan: General   Post-op Pain Management:    Induction: Intravenous  Airway Management Planned: LMA  Additional Equipment:   Intra-op Plan:   Post-operative Plan: Extubation in OR  Informed Consent: I have reviewed the patients History and Physical, chart, labs and discussed the procedure including the risks, benefits and alternatives for the proposed anesthesia with the patient or authorized representative who has indicated his/her understanding and acceptance.   Dental advisory given  Plan Discussed with: CRNA and Surgeon  Anesthesia Plan Comments:         Anesthesia Quick Evaluation

## 2014-11-22 ENCOUNTER — Encounter (HOSPITAL_BASED_OUTPATIENT_CLINIC_OR_DEPARTMENT_OTHER): Payer: Self-pay | Admitting: Orthopedic Surgery

## 2014-12-02 ENCOUNTER — Encounter: Payer: Self-pay | Admitting: Internal Medicine

## 2015-02-22 ENCOUNTER — Encounter: Payer: Self-pay | Admitting: Internal Medicine

## 2015-02-22 ENCOUNTER — Ambulatory Visit (INDEPENDENT_AMBULATORY_CARE_PROVIDER_SITE_OTHER): Payer: Self-pay | Admitting: Internal Medicine

## 2015-02-22 VITALS — BP 161/67 | HR 61 | Temp 97.9°F | Ht 62.0 in | Wt 192.0 lb

## 2015-02-22 DIAGNOSIS — S82852S Displaced trimalleolar fracture of left lower leg, sequela: Secondary | ICD-10-CM

## 2015-02-22 DIAGNOSIS — X58XXXD Exposure to other specified factors, subsequent encounter: Secondary | ICD-10-CM

## 2015-02-22 DIAGNOSIS — G4700 Insomnia, unspecified: Secondary | ICD-10-CM

## 2015-02-22 DIAGNOSIS — IMO0002 Reserved for concepts with insufficient information to code with codable children: Secondary | ICD-10-CM

## 2015-02-22 DIAGNOSIS — K5900 Constipation, unspecified: Secondary | ICD-10-CM

## 2015-02-22 DIAGNOSIS — F329 Major depressive disorder, single episode, unspecified: Secondary | ICD-10-CM

## 2015-02-22 DIAGNOSIS — Z794 Long term (current) use of insulin: Secondary | ICD-10-CM

## 2015-02-22 DIAGNOSIS — F331 Major depressive disorder, recurrent, moderate: Secondary | ICD-10-CM

## 2015-02-22 DIAGNOSIS — Z0000 Encounter for general adult medical examination without abnormal findings: Secondary | ICD-10-CM

## 2015-02-22 DIAGNOSIS — E785 Hyperlipidemia, unspecified: Secondary | ICD-10-CM

## 2015-02-22 DIAGNOSIS — E1140 Type 2 diabetes mellitus with diabetic neuropathy, unspecified: Secondary | ICD-10-CM

## 2015-02-22 DIAGNOSIS — I10 Essential (primary) hypertension: Secondary | ICD-10-CM

## 2015-02-22 DIAGNOSIS — E1165 Type 2 diabetes mellitus with hyperglycemia: Secondary | ICD-10-CM

## 2015-02-22 DIAGNOSIS — S82852D Displaced trimalleolar fracture of left lower leg, subsequent encounter for closed fracture with routine healing: Secondary | ICD-10-CM

## 2015-02-22 LAB — LIPID PANEL
Cholesterol: 325 mg/dL — ABNORMAL HIGH (ref 0–200)
HDL: 104 mg/dL (ref >=46)
LDL Cholesterol: 201 mg/dL — ABNORMAL HIGH (ref 0–99)
Total CHOL/HDL Ratio: 3.1 Ratio
Triglycerides: 99 mg/dL (ref <150)
VLDL: 20 mg/dL (ref 0–40)

## 2015-02-22 LAB — GLUCOSE, CAPILLARY: Glucose-Capillary: 74 mg/dL (ref 65–99)

## 2015-02-22 LAB — POCT GLYCOSYLATED HEMOGLOBIN (HGB A1C): Hemoglobin A1C: 6.4

## 2015-02-22 MED ORDER — SENNOSIDES-DOCUSATE SODIUM 8.6-50 MG PO TABS: 1.0000 | ORAL_TABLET | Freq: Every day | ORAL | Status: DC

## 2015-02-22 MED ORDER — GABAPENTIN 300 MG PO CAPS: 600.0000 mg | ORAL_CAPSULE | Freq: Four times a day (QID) | ORAL | Status: DC

## 2015-02-22 MED ORDER — BUPROPION HCL ER (XL) 300 MG PO TB24: 300.0000 mg | ORAL_TABLET | ORAL | Status: DC

## 2015-02-22 MED ORDER — ZOLPIDEM TARTRATE 5 MG PO TABS: 5.0000 mg | ORAL_TABLET | Freq: Every evening | ORAL | Status: DC | PRN

## 2015-02-22 MED ORDER — METFORMIN HCL 500 MG PO TABS: 500.0000 mg | ORAL_TABLET | Freq: Two times a day (BID) | ORAL | Status: AC

## 2015-02-22 NOTE — Assessment & Plan Note (Signed)
Has constipation, miralax not helping much. sennokot is on her med list but she stated she hasn't taken then.  Refilled Senna-docusate. Asked to also continue miralax with it. Likely worsened by opiates.

## 2015-02-22 NOTE — Patient Instructions (Addendum)
Take wellbutrin 150mg  (half tablet) for 5 days then take full tablet 300mg  once a day. This should take 6 weeks to see any effect. If you feel depressed or have any thoughts about hurting yourself or someone else please call us.  Take Metformin 500mg  BID for your diabetes.  Follow up in 6 weeks for your depression and diabetes.  If your blood pressure is high please consider taking Benicar every day.  Take sennokot for your constipation.  Refilled your [place]. Be careful as you are already on opioid pain med.

## 2015-02-22 NOTE — Assessment & Plan Note (Addendum)
Last hgba1c was 9.7. Hasn't followed up very closely with our clinic for diabetes. Is currently taking lantus 20 units in PM and 20 units at AM. Also done 3-5 units SSI novolog with meals. BS has been running high in mostly 200-300's with one time in 40 and also 70. 40 was due to not eating that day. Patient feels her sugars are uncontrolled due to her recent left foot pain from the fracture.   States that she is watching her diet but still eating lot of french fries (counseled against this). Eating smaller meals in size. Was on metformin in the past but was taken off after starting lantus per patient.  Continue same insulin regimen for now: lantus 20 units BID + novolog 3-5 units TID with meals  Restart metformin 500mg  BID. (Last gfr awas ~60, cutoff is <30 not using it)  Check hgba1c today. Check lipid panel just to risk stratify even though she does not want to be on any statin as she had "bad reaction" to it. Check urine microalbumin (takes Benicar only on PRN basis).  She stated she will go to [facility] for [facility] exam. Did foot exam today. Refilled Gabapentin 600mg  QID prescription. Also has cymbalta for neuropathy.   Addendum: hgba1c came back to be 6.5, which is surprising given the fact that her BS reading has been averaging in 200's. She did have some lows once in 40 and 70, perhaps there are are hypoglycemic events that is making the average over 3 months be low based on hgba1c. This could also be due to her sugars being controlled well recently until she had her acute left ankle fracture and now the numbers we are seeing are just due to recent uncontrolled BS. Other ddx of possible false low hgba1c includes hemolysis or anemia.   In any case, metformin should not cause hypoglycemia. I feel that controlling her BS will also help with the wound healing of her recent left ankle surgery.

## 2015-02-22 NOTE — Assessment & Plan Note (Signed)
Check lipid panel just to risk stratify even though she does not want to be on any statin as she had "bad reaction" to it.

## 2015-02-22 NOTE — Assessment & Plan Note (Signed)
This has been going on since he childhood. Has gone to [facility] and per patient she has tried paxil, zoloft and welbutrin without much help in the past. I offered her to send her back to [facility] or retrying welbutrin. She wants to try welbutrin first before going back to [facility].  No hx of bipolar or mania. No hx of seizure. Has loss of interest, appetite decreased, sleeping problems, low energy, irritation, depressive thoughts.   Started welbutrin 300mg  daily (150mg  for first 5 days).  Will reassess in 6 weeks.

## 2015-02-22 NOTE — Assessment & Plan Note (Signed)
Filed Vitals:   02/22/15 1337  BP: 161/67  Pulse: 61  Temp: 97.9 F (36.6 C)    BP elevated today which patient thinks is from the pain. Last few visits it was normal. She has benicar but takes only as needed. She stated her BPs are normal at home. I asked her to check it at home and consider taking benicar daily if BP is elevated.

## 2015-02-22 NOTE — Progress Notes (Signed)
   Subjective:    Patient ID: Kayla [name], female    DOB: 1961/08/12, 54 y.o.   MRN: [id number]  HPI  54 yo female with hx of DM II with neuropathy, HTN, hypothyroidism, depression, HLD, here for follow up of diabetes and med refill.  Diabetes remains uncontrolled. BS are from 40 upto 300's. Averaging 200's.  Feeling depressed as well.  Has uncontrolled neuropathic pain and also had recent left ankle fracture. Seeing Dr. Mardelle Matte for this.   See problem based charting for details.   Review of Systems  Constitutional: Positive for activity change, appetite change and fatigue. Negative for fever and chills.  HENT: Negative for sneezing, sore throat and tinnitus.   Eyes: Negative for photophobia and visual disturbance.  Respiratory: Negative for chest tightness, shortness of breath and wheezing.   Cardiovascular: Negative for chest pain, palpitations and leg swelling.  Gastrointestinal: Positive for constipation. Negative for nausea, vomiting and diarrhea.  Endocrine: Negative for polyuria.  Genitourinary: Negative for dysuria.  Musculoskeletal: Positive for arthralgias. Negative for neck pain and neck stiffness.       B/l foot/leg pain from neuropathy  Neurological: Negative for dizziness and weakness.  Hematological: Negative.   Psychiatric/Behavioral: Positive for sleep disturbance, dysphoric mood and agitation. Negative for suicidal ideas, hallucinations, confusion and self-injury. The patient is not nervous/anxious.         Objective:   Physical Exam  Constitutional: She is oriented to person, place, and time. She appears well-developed and well-nourished. No distress.  HENT:  Head: Normocephalic and atraumatic.  Mouth/Throat: Oropharynx is clear and moist. No oropharyngeal exudate.  Eyes: EOM are normal. Pupils are equal, round, and reactive to light. Right eye exhibits no discharge. Left eye exhibits no discharge.  Neck: Normal range of motion.  Cardiovascular: Normal rate  and regular rhythm.  Exam reveals no gallop and no friction rub.   No murmur heard. Pulmonary/Chest: Effort normal and breath sounds normal. No respiratory distress. She has no wheezes. She has no rales. She exhibits no tenderness.  Abdominal: Soft. Bowel sounds are normal. She exhibits no distension. There is no tenderness.  Musculoskeletal: She exhibits tenderness. She exhibits no edema.  Left foot wrapped with dressing and has bracing. I did not take it out today. Right foot has good pulses and has tenderness to palpation.  Neurological: She is alert and oriented to person, place, and time. No cranial nerve deficit.  Skin: She is not diaphoretic.       Assessment & Plan:  See problem based a&p.

## 2015-02-22 NOTE — Assessment & Plan Note (Signed)
Has been seeing Dr. Mardelle Matte for this. Continues to have pain on this ankle. She stated she had problem with poor wound healing. Dr. Mardelle Matte is working on this. She is getting Percocet from their office for her pain.   Asked her to keep following with Dr. Mardelle Matte.

## 2015-02-22 NOTE — Assessment & Plan Note (Signed)
Refilled script for ambien 5mg  daily. Asked her to be careful with this as she is already taking percocet. I hope insomnia may improve if we can treat her depression.

## 2015-02-23 LAB — MICROALBUMIN / CREATININE URINE RATIO
Creatinine, Urine: 94.9 mg/dL
Microalb Creat Ratio: 1622.8 mg/g — ABNORMAL HIGH (ref 0.0–30.0)
Microalb, Ur: 154 mg/dL — ABNORMAL HIGH (ref <2.0)

## 2015-02-23 NOTE — Progress Notes (Signed)
Case discussed with Dr. Ahmed at time of visit. We reviewed the resident's history and exam and pertinent patient test results. I agree with the assessment, diagnosis, and plan of care documented in the resident's note. 

## 2015-03-22 ENCOUNTER — Other Ambulatory Visit: Payer: Self-pay | Admitting: Orthopedic Surgery

## 2015-03-22 DIAGNOSIS — M25572 Pain in left ankle and joints of left foot: Secondary | ICD-10-CM

## 2015-03-24 ENCOUNTER — Ambulatory Visit
Admission: RE | Admit: 2015-03-24 | Discharge: 2015-03-24 | Disposition: A | Discharge status: Home / Self Care | Payer: Worker's Compensation | Source: Ambulatory Visit | Attending: Orthopedic Surgery | Admitting: Orthopedic Surgery

## 2015-03-24 DIAGNOSIS — M25572 Pain in left ankle and joints of left foot: Secondary | ICD-10-CM

## 2015-03-27 ENCOUNTER — Encounter: Payer: Self-pay | Admitting: Internal Medicine

## 2015-04-13 ENCOUNTER — Other Ambulatory Visit: Payer: Self-pay | Admitting: *Deleted

## 2015-04-13 MED ORDER — GABAPENTIN 300 MG PO CAPS: 600.0000 mg | ORAL_CAPSULE | Freq: Four times a day (QID) | ORAL | Status: DC

## 2015-04-13 NOTE — Telephone Encounter (Signed)
Patient states that the rx needs to go to [facility] not [facility]

## 2015-04-14 NOTE — Telephone Encounter (Signed)
Called to [facility]

## 2015-08-07 ENCOUNTER — Other Ambulatory Visit: Payer: Self-pay

## 2015-08-08 MED ORDER — INSULIN GLARGINE 100 UNIT/ML ~~LOC~~ SOLN: 40.0000 [IU] | Freq: Every day | SUBCUTANEOUS | Status: DC

## 2015-08-08 MED ORDER — INSULIN GLARGINE 100 UNIT/ML SOLOSTAR PEN: 40.0000 [IU] | PEN_INJECTOR | Freq: Every day | SUBCUTANEOUS | Status: DC

## 2015-08-08 NOTE — Telephone Encounter (Signed)
Per health dept pt uses the lantus solostar pen 68ml, can the RX be updated?

## 2015-08-08 NOTE — Telephone Encounter (Signed)
Phoned in PEN

## 2015-08-08 NOTE — Addendum Note (Signed)
Addended by: Dellia Nims on: 08/08/2015 02:17 PM   Modules accepted: Orders

## 2015-08-15 ENCOUNTER — Encounter: Payer: Self-pay | Admitting: Student

## 2015-08-21 ENCOUNTER — Other Ambulatory Visit: Payer: Self-pay | Admitting: *Deleted

## 2015-08-21 MED ORDER — OLMESARTAN MEDOXOMIL 40 MG PO TABS: 40.0000 mg | ORAL_TABLET | Freq: Every day | ORAL | Status: DC | PRN

## 2015-08-21 NOTE — Telephone Encounter (Signed)
Rx called in to pharmacy. 

## 2015-11-01 ENCOUNTER — Other Ambulatory Visit: Payer: Self-pay | Admitting: *Deleted

## 2015-11-01 DIAGNOSIS — IMO0002 Reserved for concepts with insufficient information to code with codable children: Secondary | ICD-10-CM

## 2015-11-01 DIAGNOSIS — E1140 Type 2 diabetes mellitus with diabetic neuropathy, unspecified: Secondary | ICD-10-CM

## 2015-11-01 DIAGNOSIS — E1165 Type 2 diabetes mellitus with hyperglycemia: Principal | ICD-10-CM

## 2015-11-01 NOTE — Telephone Encounter (Signed)
Called pt, scheduled appt for 2/27 dr Genene Churn

## 2015-11-02 MED ORDER — INSULIN ASPART 100 UNIT/ML ~~LOC~~ SOLN: 3.0000 [IU] | Freq: Three times a day (TID) | SUBCUTANEOUS | Status: DC

## 2015-11-02 NOTE — Telephone Encounter (Signed)
Called to [facility] 

## 2015-11-24 ENCOUNTER — Telehealth: Payer: Self-pay | Admitting: Internal Medicine

## 2015-11-24 NOTE — Telephone Encounter (Signed)
APPT REMINDER CALL, LMTCB IF SHE NEEDS TO CANCEL °

## 2015-11-27 ENCOUNTER — Ambulatory Visit (INDEPENDENT_AMBULATORY_CARE_PROVIDER_SITE_OTHER): Payer: Self-pay | Admitting: Internal Medicine

## 2015-11-27 ENCOUNTER — Encounter: Payer: Self-pay | Admitting: Internal Medicine

## 2015-11-27 VITALS — BP 142/72 | HR 70 | Temp 98.5°F | Ht 62.0 in | Wt 177.6 lb

## 2015-11-27 DIAGNOSIS — I10 Essential (primary) hypertension: Secondary | ICD-10-CM

## 2015-11-27 DIAGNOSIS — R296 Repeated falls: Secondary | ICD-10-CM

## 2015-11-27 DIAGNOSIS — E890 Postprocedural hypothyroidism: Secondary | ICD-10-CM

## 2015-11-27 DIAGNOSIS — E038 Other specified hypothyroidism: Secondary | ICD-10-CM

## 2015-11-27 DIAGNOSIS — L300 Nummular dermatitis: Secondary | ICD-10-CM | POA: Insufficient documentation

## 2015-11-27 DIAGNOSIS — I129 Hypertensive chronic kidney disease with stage 1 through stage 4 chronic kidney disease, or unspecified chronic kidney disease: Secondary | ICD-10-CM

## 2015-11-27 DIAGNOSIS — F332 Major depressive disorder, recurrent severe without psychotic features: Secondary | ICD-10-CM

## 2015-11-27 DIAGNOSIS — E1140 Type 2 diabetes mellitus with diabetic neuropathy, unspecified: Secondary | ICD-10-CM

## 2015-11-27 DIAGNOSIS — IMO0002 Reserved for concepts with insufficient information to code with codable children: Secondary | ICD-10-CM

## 2015-11-27 DIAGNOSIS — Z79899 Other long term (current) drug therapy: Secondary | ICD-10-CM

## 2015-11-27 DIAGNOSIS — L309 Dermatitis, unspecified: Secondary | ICD-10-CM

## 2015-11-27 DIAGNOSIS — E1165 Type 2 diabetes mellitus with hyperglycemia: Secondary | ICD-10-CM

## 2015-11-27 DIAGNOSIS — K5900 Constipation, unspecified: Secondary | ICD-10-CM

## 2015-11-27 DIAGNOSIS — N183 Chronic kidney disease, stage 3 unspecified: Secondary | ICD-10-CM

## 2015-11-27 DIAGNOSIS — Z794 Long term (current) use of insulin: Secondary | ICD-10-CM

## 2015-11-27 DIAGNOSIS — F329 Major depressive disorder, single episode, unspecified: Secondary | ICD-10-CM

## 2015-11-27 DIAGNOSIS — E1122 Type 2 diabetes mellitus with diabetic chronic kidney disease: Secondary | ICD-10-CM

## 2015-11-27 LAB — GLUCOSE, CAPILLARY: Glucose-Capillary: 136 mg/dL — ABNORMAL HIGH (ref 65–99)

## 2015-11-27 LAB — POCT GLYCOSYLATED HEMOGLOBIN (HGB A1C): Hemoglobin A1C: 8.1

## 2015-11-27 MED ORDER — TRIAMCINOLONE ACETONIDE 0.025 % EX OINT: 1.0000 "application " | TOPICAL_OINTMENT | Freq: Two times a day (BID) | CUTANEOUS | Status: DC

## 2015-11-27 NOTE — Progress Notes (Signed)
   Subjective:    Patient ID: Kayla [name], female    DOB: April 12, 1961, 55 y.o.   MRN: [id number]  HPI  55 yo female with hx of HTN, DM II, hypothyroidsm, fibromyalgia, HLD, CKD III here for follow up of HTN, DM II, and medical refill.  Depression:  Not taking wellbutrin anymore as it cause nightmares. Has not seen me (as recommended) after last visit. Taking cymbalta now but still feeling depressed. Doesn't want to try anything else or go back to [place].   HTN: BP elevated but not taking benicar. Doesn't want to take anything for BP. Keeps saying "it makes me feel weird" but can't described it.  DM II - hgba1c last was 6.5. Today it's 8.1. Not sure what exactly she is taking but then stated taking lantus 20 untis daily, with novolog occasionally. Home sugar are always high in 400's sometimes, but mostly over 150's. Never low. Not taking metformin - not sure why.  Has dry skin and one spot over her forehead that's dry and itchy. Also has dry skin throughout.  Has left ankle pain from previous ankle fracture s/p surgery. Seeing Dr. Mardelle Matte for this and also getting nerve injection for ankle pain. Having balance issue due to this. Uses cane to walk. Takes percocet (given by Dr. Mardelle Matte) for this pain.  Review of Systems  Constitutional: Positive for fatigue. Negative for fever and chills.  HENT: Negative for congestion, sore throat and trouble swallowing.   Eyes: Negative for visual disturbance.  Respiratory: Negative for chest tightness, shortness of breath and wheezing.   Cardiovascular: Negative for chest pain, palpitations and leg swelling.  Gastrointestinal: Positive for constipation. Negative for vomiting, abdominal pain, diarrhea and abdominal distention.  Genitourinary: Negative.   Musculoskeletal: Positive for back pain, arthralgias and gait problem. Negative for neck pain.  Neurological: Negative for dizziness and headaches.       Objective:   Physical Exam  Constitutional: She  is oriented to person, place, and time.  Overweight female with flat affect  HENT:  Head: Normocephalic and atraumatic.  Eyes: Conjunctivae are normal. Pupils are equal, round, and reactive to light.  Neck: Normal range of motion.  Cardiovascular: Normal rate and regular rhythm.   Pulmonary/Chest: No respiratory distress. She exhibits no tenderness.  Abdominal: Soft. Bowel sounds are normal. She exhibits no distension. There is no tenderness.  Musculoskeletal: She exhibits no edema.  Left ankle has surgical scar medially. Has tenderness over this area. Limited range of motion on left ankle joint.  Diminished sensation to light touch b/l upto her ankles.   Neurological: She is alert and oriented to person, place, and time.  Skin:  Skin is dry. There is about 2cm round plaque over her forehead with dry skin on top.      Filed Vitals:   11/27/15 1454  BP: 142/72  Pulse: 70  Temp: 98.5 F (36.9 C)        Assessment & Plan:  See problem based a&p.

## 2015-11-27 NOTE — Assessment & Plan Note (Signed)
On Cymbalta 50mg  daily currently. Doesn't want to try anything else at this point and does not want to go to [facility] either.  Will continue this for now. Denies any homicidal or suicidal thoughts.

## 2015-11-27 NOTE — Assessment & Plan Note (Addendum)
Multiple falls 2/2 to balance issues 2/2 to ankle pain and also diabetic peripheral neuropathy.   Asked to continue f/up with Dr. Mardelle Matte regarding her ankle pain. Continue gabapentin for neuropathy and focus on diabetes control in order to stop the progression of her neuropathy.  Cannot afford PT at this time as she has no insurance.   Asked her to continue using her cane for safety.

## 2015-11-27 NOTE — Patient Instructions (Addendum)
Use kenalog cream daily on your forehead area.  Use moisturizer (vaseline or eucerin cream) for dry skin.  I would like you to take your Benicar to help with diabetes and blood pressure if you can.  Continue cymbalta. Let us know if you want to try anything else or go back to [place].   Take lantus 30 units daily. Take novolog 5 units with meals. Check your sugar 4 times a day. Bring your meter with you next visit please.

## 2015-11-27 NOTE — Assessment & Plan Note (Addendum)
Hemoglobin a1c higher 8.1 now (was 6.5 last).  Taking lantus 20 units + occasionally doing novolog 5 units. Not taking her metformin stating it makes her feel weird.  Home glucose in 400's sometimes but most of the time >150. Didn't bring her meter. Denies any hypoglycemia.  Will do: lantus 30 units daily + novolog 5 units TID (can do 2 units when not taking much).  Check CMET, urine microalbumin.   Addendum: on CMET her glucose was in 60's. Showed worsening Crt. She was very unclear about exactly what insulin regimen she was taking. Will ask patient not to go up on the lantus as initially planned given her hypoglycemia. Will try again to reach patient, was not able to reach her earlier today.

## 2015-11-27 NOTE — Assessment & Plan Note (Signed)
Has extremely dry skin overall along with one area of plaque over her forehead which could be eczema lesion.  Asked her to continue vaseline and eucerin cream for overall dry skin.  Prescribed kenalog cream for forehead lesion.

## 2015-11-27 NOTE — Assessment & Plan Note (Signed)
Last crt 1.55, GFR in 40's. Will recheck BMET today. This is likely 2/2 to uncontrolled DM.  Patient does not want to take aceI or ARB because of unclear side effects.

## 2015-11-27 NOTE — Assessment & Plan Note (Signed)
Filed Vitals:   11/27/15 1454  BP: 142/72  Pulse: 70  Temp: 98.5 F (36.9 C)   BP elevated. Not taking her benicar. I told her benicar would be protective since she has diabetes, CKD, and proteinuria. However, patient does not want to take this. Also does not want to take anything else for her blood pressure. Keep saying benicar makes her feel weird. Also had dizziness/nausea with AceI.  Will focus on lifestyle modification for now since patient does not want to take any medication.

## 2015-11-28 LAB — COMPREHENSIVE METABOLIC PANEL
ALT: 21 IU/L (ref 0–32)
AST: 41 IU/L — ABNORMAL HIGH (ref 0–40)
Albumin/Globulin Ratio: 1.3 (ref 1.1–2.5)
Albumin: 4.2 g/dL (ref 3.5–5.5)
Alkaline Phosphatase: 138 IU/L — ABNORMAL HIGH (ref 39–117)
BUN/Creatinine Ratio: 10 (ref 9–23)
BUN: 20 mg/dL (ref 6–24)
Bilirubin Total: 1.3 mg/dL — ABNORMAL HIGH (ref 0.0–1.2)
CO2: 20 mmol/L (ref 18–29)
Calcium: 11 mg/dL — ABNORMAL HIGH (ref 8.7–10.2)
Chloride: 101 mmol/L (ref 96–106)
Creatinine, Ser: 2.02 mg/dL — ABNORMAL HIGH (ref 0.57–1.00)
GFR calc Af Amer: 32 mL/min/{1.73_m2} — ABNORMAL LOW (ref >59)
GFR calc non Af Amer: 28 mL/min/{1.73_m2} — ABNORMAL LOW (ref >59)
Globulin, Total: 3.3 g/dL (ref 1.5–4.5)
Glucose: 60 mg/dL — ABNORMAL LOW (ref 65–99)
Potassium: 4.3 mmol/L (ref 3.5–5.2)
Sodium: 142 mmol/L (ref 134–144)
Total Protein: 7.5 g/dL (ref 6.0–8.5)

## 2015-11-28 LAB — MICROALBUMIN / CREATININE URINE RATIO
Creatinine, Urine: 160.3 mg/dL
MICROALB/CREAT RATIO: 2184 mg/g creat — ABNORMAL HIGH (ref 0.0–30.0)
Microalbumin, Urine: 3501 ug/mL

## 2015-11-28 LAB — TSH: TSH: 214.2 u[IU]/mL — ABNORMAL HIGH (ref 0.450–4.500)

## 2015-11-28 NOTE — Addendum Note (Signed)
Addended by: Truddie Crumble on: 11/28/2015 01:45 PM   Modules accepted: Orders

## 2015-11-28 NOTE — Assessment & Plan Note (Signed)
Currently on synthyroid 194mcg. Last TSH >100, today 214.   Her constipation and depression could be 2/2 to hypothyroidism. Attempted to call patient to confirm whether she is really taking her meds. This will need to be titrated up if she is taking it.

## 2015-11-28 NOTE — Assessment & Plan Note (Addendum)
She had borderline elevated calcium (corrected 10.5). Checked CMET today which showed calcium of 11.  Tried to call patient to ask her to come back to get futher lab work needed for hypercalcemia workup. Could not reach her. Will try again.  Will order PTH, ioinized calcium, and repeat CMET. This is likely 2/2 to her CKD.

## 2015-11-29 NOTE — Addendum Note (Signed)
Addended by: Truddie Crumble on: 11/29/2015 09:09 AM   Modules accepted: Orders

## 2015-11-30 NOTE — Progress Notes (Signed)
Internal Medicine Clinic Attending  Case discussed with Dr. Ahmed at the time of the visit.  We reviewed the resident's history and exam and pertinent patient test results.  I agree with the assessment, diagnosis, and plan of care documented in the resident's note. 

## 2015-12-01 ENCOUNTER — Other Ambulatory Visit: Payer: Self-pay

## 2015-12-01 ENCOUNTER — Telehealth: Payer: Self-pay | Admitting: *Deleted

## 2015-12-01 NOTE — Telephone Encounter (Signed)
RTC to patient who had called stating that she had received a call from Dr. Genene Churn and was returning his call.  Spoke with Dr. Genene Churn would like for patient to come in for labs and an office visit as soon as possible.  Patient said that she could come in next week for an appointment.  Patient's  was routed to front office for scheduling.  Sander Nephew, RN 12/01/2015

## 2015-12-07 ENCOUNTER — Telehealth: Payer: Self-pay | Admitting: Internal Medicine

## 2015-12-07 ENCOUNTER — Telehealth: Payer: Self-pay | Admitting: Dietician

## 2015-12-07 NOTE — Telephone Encounter (Signed)
APPT. REMINDER CALL, LMTCB °

## 2015-12-08 ENCOUNTER — Ambulatory Visit: Payer: Self-pay | Admitting: Internal Medicine

## 2015-12-08 ENCOUNTER — Encounter: Payer: Self-pay | Admitting: Internal Medicine

## 2015-12-08 NOTE — Telephone Encounter (Signed)
Kayla [name] from the [facility] returned my call saying they need a record of release.  [id number]

## 2016-01-19 ENCOUNTER — Other Ambulatory Visit: Payer: Self-pay

## 2016-01-19 DIAGNOSIS — IMO0002 Reserved for concepts with insufficient information to code with codable children: Secondary | ICD-10-CM

## 2016-01-19 DIAGNOSIS — E1165 Type 2 diabetes mellitus with hyperglycemia: Principal | ICD-10-CM

## 2016-01-19 DIAGNOSIS — E1140 Type 2 diabetes mellitus with diabetic neuropathy, unspecified: Secondary | ICD-10-CM

## 2016-01-22 MED ORDER — DULOXETINE HCL 30 MG PO CPEP: 60.0000 mg | ORAL_CAPSULE | Freq: Every day | ORAL | Status: DC

## 2016-01-30 NOTE — Telephone Encounter (Signed)
Phoned in.

## 2016-02-05 NOTE — Addendum Note (Signed)
Addended by: Dellia Nims on: 02/05/2016 02:41 PM   Modules accepted: Orders

## 2016-10-18 ENCOUNTER — Telehealth: Payer: Self-pay | Admitting: Internal Medicine

## 2016-10-18 NOTE — Telephone Encounter (Signed)
APT. REMINDER CALL, LMTCB °

## 2016-10-21 ENCOUNTER — Ambulatory Visit (INDEPENDENT_AMBULATORY_CARE_PROVIDER_SITE_OTHER): Payer: Self-pay | Admitting: Internal Medicine

## 2016-10-21 ENCOUNTER — Encounter: Payer: Self-pay | Admitting: Internal Medicine

## 2016-10-21 ENCOUNTER — Ambulatory Visit (HOSPITAL_COMMUNITY)
Admission: RE | Admit: 2016-10-21 | Discharge: 2016-10-21 | Disposition: A | Discharge status: Home / Self Care | Payer: Self-pay | Source: Ambulatory Visit | Attending: Internal Medicine | Admitting: Internal Medicine

## 2016-10-21 VITALS — BP 194/79 | HR 75 | Temp 98.5°F | Wt 165.6 lb

## 2016-10-21 DIAGNOSIS — M797 Fibromyalgia: Secondary | ICD-10-CM

## 2016-10-21 DIAGNOSIS — Z79899 Other long term (current) drug therapy: Secondary | ICD-10-CM

## 2016-10-21 DIAGNOSIS — E1142 Type 2 diabetes mellitus with diabetic polyneuropathy: Secondary | ICD-10-CM

## 2016-10-21 DIAGNOSIS — E1122 Type 2 diabetes mellitus with diabetic chronic kidney disease: Secondary | ICD-10-CM

## 2016-10-21 DIAGNOSIS — E559 Vitamin D deficiency, unspecified: Secondary | ICD-10-CM

## 2016-10-21 DIAGNOSIS — N183 Chronic kidney disease, stage 3 unspecified: Secondary | ICD-10-CM

## 2016-10-21 DIAGNOSIS — I129 Hypertensive chronic kidney disease with stage 1 through stage 4 chronic kidney disease, or unspecified chronic kidney disease: Secondary | ICD-10-CM

## 2016-10-21 DIAGNOSIS — IMO0002 Reserved for concepts with insufficient information to code with codable children: Secondary | ICD-10-CM

## 2016-10-21 DIAGNOSIS — B373 Candidiasis of vulva and vagina: Secondary | ICD-10-CM | POA: Insufficient documentation

## 2016-10-21 DIAGNOSIS — R0789 Other chest pain: Secondary | ICD-10-CM | POA: Insufficient documentation

## 2016-10-21 DIAGNOSIS — N898 Other specified noninflammatory disorders of vagina: Secondary | ICD-10-CM

## 2016-10-21 DIAGNOSIS — B3731 Acute candidiasis of vulva and vagina: Secondary | ICD-10-CM

## 2016-10-21 DIAGNOSIS — R748 Abnormal levels of other serum enzymes: Secondary | ICD-10-CM

## 2016-10-21 DIAGNOSIS — Z794 Long term (current) use of insulin: Secondary | ICD-10-CM

## 2016-10-21 DIAGNOSIS — Z87891 Personal history of nicotine dependence: Secondary | ICD-10-CM

## 2016-10-21 DIAGNOSIS — F329 Major depressive disorder, single episode, unspecified: Secondary | ICD-10-CM

## 2016-10-21 DIAGNOSIS — I10 Essential (primary) hypertension: Secondary | ICD-10-CM

## 2016-10-21 DIAGNOSIS — E890 Postprocedural hypothyroidism: Secondary | ICD-10-CM

## 2016-10-21 DIAGNOSIS — E1165 Type 2 diabetes mellitus with hyperglycemia: Secondary | ICD-10-CM

## 2016-10-21 DIAGNOSIS — E1140 Type 2 diabetes mellitus with diabetic neuropathy, unspecified: Secondary | ICD-10-CM

## 2016-10-21 DIAGNOSIS — F331 Major depressive disorder, recurrent, moderate: Secondary | ICD-10-CM

## 2016-10-21 DIAGNOSIS — E785 Hyperlipidemia, unspecified: Secondary | ICD-10-CM

## 2016-10-21 LAB — GLUCOSE, CAPILLARY: Glucose-Capillary: 160 mg/dL — ABNORMAL HIGH (ref 65–99)

## 2016-10-21 LAB — POCT GLYCOSYLATED HEMOGLOBIN (HGB A1C): Hemoglobin A1C: 6.3

## 2016-10-21 MED ORDER — ASPIRIN EC 81 MG PO TBEC
81.0000 mg | DELAYED_RELEASE_TABLET | Freq: Every day | ORAL | 2 refills | Status: AC
Start: 2016-10-21 — End: 2017-10-21

## 2016-10-21 MED ORDER — FLUCONAZOLE 150 MG PO TABS: 150.0000 mg | ORAL_TABLET | Freq: Every day | ORAL | 0 refills | Status: DC

## 2016-10-21 MED ORDER — DULOXETINE HCL 60 MG PO CPEP: 60.0000 mg | ORAL_CAPSULE | Freq: Every day | ORAL | 1 refills | Status: DC

## 2016-10-21 MED ORDER — GLUCOSE BLOOD VI STRP: ORAL_STRIP | 12 refills | Status: DC

## 2016-10-21 MED ORDER — ACCU-CHEK SOFTCLIX LANCET DEV MISC: 0 refills | Status: AC

## 2016-10-21 MED FILL — DULoxetine HCL 60 MG CPEP: 60 | 30 days supply | Qty: 30 | Fill #0

## 2016-10-21 NOTE — Assessment & Plan Note (Addendum)
Last TSH 214. On Synthyroid 162mcg currently. TSH remains elevated 110. Will increase synthryoid to 168mcg. Discussed with patient the need to take this in empty stomach.   Recheck TSH in 4-6 weeks.

## 2016-10-21 NOTE — Assessment & Plan Note (Signed)
Has diabetic with severe polyneuropathy. Lacks sensation to light touch below her shins, left worse than right. Currently on lantus 20 units bid. Ran out of novolog. Ran out of testing supplies. Last hgba1c 8.1. Today:  Refilled her testing supplies. Check urine microalbumin, check CMET, lipid panel.

## 2016-10-21 NOTE — Progress Notes (Signed)
CC: f/up for HTN, DM II, depression, chest pain  HPI:  Ms.Kao Sommerfeld is a 56 y.o. with multiple PMH and poor medical follow up here for DM II, HTN, depression, and chest pain evaluation.   Past Medical History:  Diagnosis Date  . ANA POSITIVE 02/09/2010  . Anal warts 11/09/2012  . Anxiety   . Arthritis    "my bones ache all the time" (06/27/2014)  . Cellulitis 06/27/2014   medial proximal left thigh extending to the perineium  . Chronic bronchitis ([place])    "get it changing of the seasons; sometimes q yr; sometimes not"   . Complication of anesthesia    sats drop post op  . COPD (chronic obstructive pulmonary disease) ([place])   . 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 Diabetic peripheral neuropathy ([place]) since <2005   "hands and feet"  . Dizziness    Multiple falls at home  . Fibromyalgia   . Graves' disease    "they nuked it twice"  . LNLGXQJJ(941.7)    "monthly" (06/27/2014)  . Heart murmur    as a child-never had echo to evaluate  . Hyperlipidemia   . Hypertension   . Hypothyroidism   . Sickle cell trait ([place])   . Trimalleolar fracture of left ankle 11/21/2014  . Type 2 diabetes mellitus ([place])   . Wears glasses     Chest pain - for 1 year, comes and goes, sharp, wakes her up from her sleep, sometimes feels like indigestion, happens randomly, woke up 3 AM this morning with the chest pain on the right side. It felt like a pressure pain, 10/10, radiated to the right shoulder. She still has the pain 6/10 now. No association with exertion. Unclear if having GERD symptoms. EKG showed NSR, old TWI on avL. No ST changes. QTC ok. Intervals ok. ?LVH.   Depression - ran out of Cymbalta 2 months ago. cymbalta helped with fibromyalgia and depression along with neurontin. Wants to get back on cymbalta.   HTN - BP elevated. Was on benicar in the past but it made her feel "weird" and did not want to be on meds in the past.   DM II - last hgba1c 8.1 ten months  ago. Today hgba1c 6.3. Ran out of novolog, has been taking lantus 20 units BID. taking it all at once causes her to feel bad. Was on 3-5 units of novolog TID in the past but not currently.   Hypothyroidism - last TSH 214. is on synthyroid 154mcg daily. Having some f  Vaginal itching - has vaginal itching. Tried monistat without improvement. Tried 1x diflucan from her daughter without improvement. No dysuria. No discharge.   CKD III - last crt 2.02, was 1.5 before that.   Review of Systems:   Review of Systems  Constitutional: Negative for chills and fever.  Eyes: Negative for blurred vision and double vision.  Cardiovascular: Positive for chest pain. Negative for palpitations.  Gastrointestinal: Negative for abdominal pain, nausea and vomiting.  Genitourinary: Negative for dysuria.       Has vaginal itching.   Skin: Negative for rash.    Physical Exam:  Vitals:   10/21/16 1337  BP: (!) 194/79  Pulse: 75  Temp: 98.5 F (36.9 C)  TempSrc: Oral  SpO2: 100%  Weight: 165 lb 9.6 oz (75.1 kg)   Physical Exam  Constitutional: She is oriented to person, place, and time. She appears well-developed and well-nourished. No distress.  HENT:  Head: Normocephalic and atraumatic.  Has exophthalmus   Eyes: Conjunctivae are normal. Pupils are equal, round, and reactive to light.  Has exophthalmus    Neck: Normal range of motion.  Cardiovascular: Normal rate and regular rhythm.  Exam reveals no gallop and no friction rub.   Systolic murmur noted.   Respiratory: Effort normal and breath sounds normal. No respiratory distress. She has no wheezes.  Has TTP over her right chest wall.   Musculoskeletal: Normal range of motion. She exhibits no edema.  Has surgical scar on her left ankle.   Neurological: She is alert and oriented to person, place, and time. No cranial nerve deficit.  Lacks sensation to light touch below her shins, left worse than right.   Skin: Skin is warm. She is not  diaphoretic.  Psychiatric: She has a normal mood and affect.     Assessment & Plan:   See Encounters Tab for problem based charting.  Patient discussed with Dr. Evette Doffing

## 2016-10-21 NOTE — Assessment & Plan Note (Addendum)
Had Ca2+ of 11 last time on 11/2015 but today on recheck her Calcium is 10.2 which is the upper limit of normal. Ionized calcium slightly elevated. PTH high 220. This is likely secondary hyperparathyroidism from metabolic bone disease 2/2 to CKD 3.  She is not symptomatic from her very mildly elevated Ca2+.   I forgot to add phos level to lab, will check Phos and Vitamin D as add on. Will treat hyperphosphatemia and vitamin deficiency if needed. Will need to monitor her Renal function panel and PTH regularly. Will defer referring to nephrology for now as she has no insurance.

## 2016-10-21 NOTE — Assessment & Plan Note (Signed)
Likely has yeast vaginitis due to uncontrolled DM II.  Will treat with diflucan 150mg  once.

## 2016-10-21 NOTE — Patient Instructions (Signed)
I am glad you came in.  We have a lot of work to do. I am glad your diabetes is under control. We will need to get your BP and other problems under control as well.  I will call you after I get you results and make recommendations about medications.  Follow up in 1 month.

## 2016-10-21 NOTE — Assessment & Plan Note (Signed)
Has atypical chest pain, likely from fibromyalgia. Has tenderness to palpation over her right chest. EKG is re-assuring without any acute changes.   She will benefit from ischemic workup as she is high risk for ASCVD. Will start with medical management including BP control, DM II control and HLD control, will add asa 81mg  daily today.

## 2016-10-21 NOTE — Assessment & Plan Note (Addendum)
Vitals:   10/21/16 1337  BP: (!) 194/79  Pulse: 75  Temp: 98.5 F (36.9 C)   BP remains significantly elevated. currenlty not on meds. Has tried binicar, losartan, and lisinopril in the past and it made her feel weird. She is open to trying Losartan again to help control her BP and protect her from diabetic nephropathy.  Will start losartan 54m daily. Has CKD 3 already. Will have to be careful with her kidney function worsening with this as her eGFR is 35.  Close follow up in 1 month with repeat labs.

## 2016-10-21 NOTE — Assessment & Plan Note (Signed)
Continues to have generalized body pain from her fibromyalgia. Stated that cymbalta and neurontin worked wel in the past. [place] out of cymbalta.  Will refill cymbalta 60mg  daily and assess for improvement in 1 month.

## 2016-10-21 NOTE — Assessment & Plan Note (Addendum)
Had elevated Tbili 138, today was 130. Rest of LFTs are normal. Checked GGT - was elevated. She had elevated Anti mitochondrial antibody level in the past on 2015.  Had abd u/s on 2015: IMPRESSION: 1. No acute findings.  Normal gallbladder.  No bile duct dilation. 2. Round, hypoechoic lesion versus bowel lies along the tail the pancreas. Since a pancreatic mass is not excluded, followup imaging with contrast enhanced CT of the abdomen is recommended. 3. 14 mm right renal cyst.  No other abnormalities.    Will order CT abdomen. Will repeat AMA level. Currently the labs are not diagnostic of PBC but has some features that could suggest it suc has elevated AMA level in the past and elevated Alk phos (although not 1.5x ULN). May need to refer to GI for diagnostic assistance if repeat AMA level still is elevated.

## 2016-10-21 NOTE — Assessment & Plan Note (Addendum)
Will recheck lipid panel today. She is open to trying statin again after I explained the benefits. Was on pravastatin in the past and it made her feel "zoned out".   I will start crestor 20mg  daily as this is on the $4 list at the [place].

## 2016-10-21 NOTE — Assessment & Plan Note (Addendum)
Was doing well on cymbalta 60mg  daily. Ran out, will re-assess how she is doing. No SI/HI.   Did not have time to do PHQ9 today. Will do one in the future.

## 2016-10-22 LAB — LIPID PANEL
Chol/HDL Ratio: 3.8 ratio units (ref 0.0–4.4)
Cholesterol, Total: 269 mg/dL — ABNORMAL HIGH (ref 100–199)
HDL: 71 mg/dL (ref >39)
LDL Calculated: 174 mg/dL — ABNORMAL HIGH (ref 0–99)
Triglycerides: 121 mg/dL (ref 0–149)
VLDL Cholesterol Cal: 24 mg/dL (ref 5–40)

## 2016-10-22 LAB — COMPREHENSIVE METABOLIC PANEL
ALT: 10 IU/L (ref 0–32)
AST: 19 IU/L (ref 0–40)
Albumin/Globulin Ratio: 1.3 (ref 1.2–2.2)
Albumin: 4 g/dL (ref 3.5–5.5)
Alkaline Phosphatase: 130 IU/L — ABNORMAL HIGH (ref 39–117)
BUN/Creatinine Ratio: 11 (ref 9–23)
BUN: 20 mg/dL (ref 6–24)
Bilirubin Total: 0.6 mg/dL (ref 0.0–1.2)
CO2: 23 mmol/L (ref 18–29)
Calcium: 10.2 mg/dL (ref 8.7–10.2)
Chloride: 103 mmol/L (ref 96–106)
Creatinine, Ser: 1.84 mg/dL — ABNORMAL HIGH (ref 0.57–1.00)
GFR calc Af Amer: 35 mL/min/{1.73_m2} — ABNORMAL LOW (ref >59)
GFR calc non Af Amer: 31 mL/min/{1.73_m2} — ABNORMAL LOW (ref >59)
Globulin, Total: 3.2 g/dL (ref 1.5–4.5)
Glucose: 165 mg/dL — ABNORMAL HIGH (ref 65–99)
Potassium: 4.1 mmol/L (ref 3.5–5.2)
Sodium: 140 mmol/L (ref 134–144)
Total Protein: 7.2 g/dL (ref 6.0–8.5)

## 2016-10-22 LAB — SPECIMEN STATUS REPORT

## 2016-10-22 LAB — PARATHYROID HORMONE, INTACT (NO CA): PTH: 220 pg/mL — ABNORMAL HIGH (ref 15–65)

## 2016-10-22 LAB — MICROALBUMIN / CREATININE URINE RATIO
Creatinine, Urine: 39.7 mg/dL
Microalb/Creat Ratio: 7299.2 mg/g creat — ABNORMAL HIGH (ref 0.0–30.0)
Microalbumin, Urine: 2897.8 ug/mL

## 2016-10-22 LAB — CALCIUM, IONIZED: Calcium, Ionized, Serum: 5.7 mg/dL — ABNORMAL HIGH (ref 4.5–5.6)

## 2016-10-22 LAB — TSH: TSH: 110.7 u[IU]/mL — ABNORMAL HIGH (ref 0.450–4.500)

## 2016-10-22 MED ORDER — ROSUVASTATIN CALCIUM 20 MG PO TABS: 20.0000 mg | ORAL_TABLET | Freq: Every day | ORAL | 2 refills | Status: DC

## 2016-10-22 MED ORDER — LEVOTHYROXINE SODIUM 175 MCG PO TABS: 175.0000 ug | ORAL_TABLET | Freq: Every day | ORAL | 2 refills | Status: DC

## 2016-10-22 MED ORDER — LOSARTAN POTASSIUM 50 MG PO TABS: 50.0000 mg | ORAL_TABLET | Freq: Every day | ORAL | 2 refills | Status: DC

## 2016-10-22 MED FILL — ROSUVASTATIN CALCIUM 20 MG: 20 | 30 days supply | Qty: 30 | Fill #0

## 2016-10-22 MED FILL — LEVOTHYROXINE 175 MCG TABLE: 175 | 30 days supply | Qty: 30 | Fill #0

## 2016-10-22 MED FILL — LOSARTAN POTASSIUM 50 MG TA: 50 | 30 days supply | Qty: 30 | Fill #0

## 2016-10-22 NOTE — Assessment & Plan Note (Addendum)
crt improved from last time from 2 to 1.84. However, compared to 2 years ago, her kidney function is worse with eGFR 35. This is likely 2/2 to uncontrolled HTN and DM II.  Will add losartan 75m daily. Will have her come back in 1 month and recheck labs. Will have to be careful as losartan can worsen her crt.  Also likely has Metabolic bone disease as indicated by silghtly elevated calcium and elevated PTH. Will check phos and vitamin D. Need renal panel checked with intact PTH every 3-6 months.    Will defer referring to nephrology for now as she has no insurance.

## 2016-10-22 NOTE — Progress Notes (Signed)
Internal Medicine Clinic Attending  Case discussed with Dr. Ahmed at the time of the visit.  We reviewed the resident's history and exam and pertinent patient test results.  I agree with the assessment, diagnosis, and plan of care documented in the resident's note. 

## 2016-10-23 LAB — PHOSPHORUS: Phosphorus: 2.8 mg/dL (ref 2.5–4.5)

## 2016-10-23 LAB — SPECIMEN STATUS REPORT

## 2016-10-23 LAB — VITAMIN D 25 HYDROXY (VIT D DEFICIENCY, FRACTURES): Vit D, 25-Hydroxy: 6.7 ng/mL — ABNORMAL LOW (ref 30.0–100.0)

## 2016-10-23 LAB — GAMMA GT: GGT: 360 IU/L — ABNORMAL HIGH (ref 0–60)

## 2016-10-23 MED FILL — FLUCONAZOLE 150 MG TABLET: 150 | 1 days supply | Qty: 1 | Fill #0

## 2016-10-25 MED ORDER — VITAMIN D (ERGOCALCIFEROL) 1.25 MG (50000 UNIT) PO CAPS: 50000.0000 [IU] | ORAL_CAPSULE | ORAL | 0 refills | Status: DC

## 2016-10-25 NOTE — Addendum Note (Signed)
Addended by: Dellia Nims on: 10/25/2016 09:42 AM   Modules accepted: Orders

## 2016-10-25 NOTE — Addendum Note (Signed)
Addended by: Dellia Nims on: 10/25/2016 10:03 AM   Modules accepted: Orders

## 2016-10-27 LAB — MITOCHONDRIAL ANTIBODIES: Mitochondrial Ab: 8.6 Units (ref 0.0–20.0)

## 2016-10-27 LAB — SPECIMEN STATUS REPORT

## 2016-10-28 ENCOUNTER — Other Ambulatory Visit: Payer: Self-pay

## 2016-10-28 NOTE — Telephone Encounter (Signed)
[facility], they will get strips ready, called pt, she will pick up today, she would also like gabapentin, sending request to dr Genene Churn

## 2016-10-28 NOTE — Telephone Encounter (Signed)
glucose blood (ACCU-CHEK AVIVA PLUS) test strip, Refill request.

## 2016-10-30 LAB — SPECIMEN STATUS REPORT

## 2016-10-30 LAB — MITOCHONDRIAL ANTIBODIES: Mitochondrial Ab: 8.6 Units (ref 0.0–20.0)

## 2016-10-30 MED ORDER — GABAPENTIN 300 MG PO CAPS: 600.0000 mg | ORAL_CAPSULE | Freq: Four times a day (QID) | ORAL | 6 refills | Status: DC

## 2016-10-30 NOTE — Telephone Encounter (Signed)
Cr clearance is 41. Max gabapentin dose can be 1400mg  with this. Currently on 1200mg  total so should be ok.

## 2016-11-05 ENCOUNTER — Encounter: Payer: Self-pay | Admitting: *Deleted

## 2016-11-14 ENCOUNTER — Ambulatory Visit (HOSPITAL_COMMUNITY): Admission: RE | Admit: 2016-11-14 | Payer: Self-pay | Source: Ambulatory Visit

## 2016-12-16 ENCOUNTER — Encounter: Payer: Self-pay | Admitting: Internal Medicine

## 2016-12-23 ENCOUNTER — Encounter: Payer: Self-pay | Admitting: Internal Medicine

## 2017-02-19 ENCOUNTER — Other Ambulatory Visit: Payer: Self-pay | Admitting: Internal Medicine

## 2017-02-19 DIAGNOSIS — IMO0002 Reserved for concepts with insufficient information to code with codable children: Secondary | ICD-10-CM

## 2017-02-19 DIAGNOSIS — E890 Postprocedural hypothyroidism: Secondary | ICD-10-CM

## 2017-02-19 DIAGNOSIS — E1140 Type 2 diabetes mellitus with diabetic neuropathy, unspecified: Secondary | ICD-10-CM

## 2017-02-19 DIAGNOSIS — E1165 Type 2 diabetes mellitus with hyperglycemia: Principal | ICD-10-CM

## 2017-02-19 MED ORDER — INSULIN GLARGINE 100 UNIT/ML SOLOSTAR PEN: 40.0000 [IU] | PEN_INJECTOR | Freq: Every day | SUBCUTANEOUS | 11 refills | Status: DC

## 2017-02-19 MED ORDER — LEVOTHYROXINE SODIUM 175 MCG PO TABS: 175.0000 ug | ORAL_TABLET | Freq: Every day | ORAL | 2 refills | Status: DC

## 2017-02-19 NOTE — Progress Notes (Signed)
Received med refill [facility] from [facility]. I put in refill for synthroid and lantus.

## 2017-03-06 ENCOUNTER — Encounter: Payer: Self-pay | Admitting: *Deleted

## 2017-05-05 ENCOUNTER — Telehealth: Payer: Self-pay

## 2017-05-05 NOTE — Telephone Encounter (Signed)
error 

## 2017-06-16 ENCOUNTER — Encounter: Payer: Self-pay | Admitting: Internal Medicine

## 2017-07-29 ENCOUNTER — Encounter (INDEPENDENT_AMBULATORY_CARE_PROVIDER_SITE_OTHER): Payer: Self-pay

## 2017-07-29 ENCOUNTER — Ambulatory Visit: Payer: Self-pay

## 2017-07-31 ENCOUNTER — Other Ambulatory Visit: Payer: Self-pay | Admitting: *Deleted

## 2017-07-31 DIAGNOSIS — E890 Postprocedural hypothyroidism: Secondary | ICD-10-CM

## 2017-07-31 MED ORDER — INSULIN GLARGINE 100 UNIT/ML SOLOSTAR PEN: 40.0000 [IU] | PEN_INJECTOR | Freq: Every day | SUBCUTANEOUS | 0 refills | Status: DC

## 2017-07-31 MED ORDER — LEVOTHYROXINE SODIUM 175 MCG PO TABS: 175.0000 ug | ORAL_TABLET | Freq: Every day | ORAL | 0 refills | Status: DC

## 2017-08-04 NOTE — Telephone Encounter (Signed)
Done 11/19 appt

## 2017-08-06 ENCOUNTER — Ambulatory Visit (INDEPENDENT_AMBULATORY_CARE_PROVIDER_SITE_OTHER): Payer: Self-pay | Admitting: Internal Medicine

## 2017-08-06 ENCOUNTER — Encounter: Payer: Self-pay | Admitting: Internal Medicine

## 2017-08-06 VITALS — BP 190/92 | HR 77 | Temp 97.8°F | Ht 62.0 in | Wt 173.6 lb

## 2017-08-06 DIAGNOSIS — I10 Essential (primary) hypertension: Secondary | ICD-10-CM

## 2017-08-06 DIAGNOSIS — E890 Postprocedural hypothyroidism: Secondary | ICD-10-CM

## 2017-08-06 DIAGNOSIS — M25572 Pain in left ankle and joints of left foot: Secondary | ICD-10-CM

## 2017-08-06 DIAGNOSIS — Z9889 Other specified postprocedural states: Secondary | ICD-10-CM

## 2017-08-06 DIAGNOSIS — Z8781 Personal history of (healed) traumatic fracture: Secondary | ICD-10-CM

## 2017-08-06 DIAGNOSIS — M25472 Effusion, left ankle: Secondary | ICD-10-CM

## 2017-08-06 DIAGNOSIS — R801 Persistent proteinuria, unspecified: Secondary | ICD-10-CM

## 2017-08-06 DIAGNOSIS — M79605 Pain in left leg: Secondary | ICD-10-CM

## 2017-08-06 DIAGNOSIS — I129 Hypertensive chronic kidney disease with stage 1 through stage 4 chronic kidney disease, or unspecified chronic kidney disease: Secondary | ICD-10-CM

## 2017-08-06 DIAGNOSIS — E1140 Type 2 diabetes mellitus with diabetic neuropathy, unspecified: Secondary | ICD-10-CM

## 2017-08-06 DIAGNOSIS — E1122 Type 2 diabetes mellitus with diabetic chronic kidney disease: Secondary | ICD-10-CM

## 2017-08-06 DIAGNOSIS — R809 Proteinuria, unspecified: Secondary | ICD-10-CM

## 2017-08-06 DIAGNOSIS — IMO0002 Reserved for concepts with insufficient information to code with codable children: Secondary | ICD-10-CM

## 2017-08-06 DIAGNOSIS — E1165 Type 2 diabetes mellitus with hyperglycemia: Secondary | ICD-10-CM

## 2017-08-06 DIAGNOSIS — M7989 Other specified soft tissue disorders: Principal | ICD-10-CM

## 2017-08-06 DIAGNOSIS — N183 Chronic kidney disease, stage 3 unspecified: Secondary | ICD-10-CM

## 2017-08-06 DIAGNOSIS — Z9114 Patient's other noncompliance with medication regimen: Secondary | ICD-10-CM

## 2017-08-06 LAB — POCT GLYCOSYLATED HEMOGLOBIN (HGB A1C): Hemoglobin A1C: 6.5

## 2017-08-06 LAB — GLUCOSE, CAPILLARY: Glucose-Capillary: 61 mg/dL — ABNORMAL LOW (ref 65–99)

## 2017-08-06 MED ORDER — LOSARTAN POTASSIUM 50 MG PO TABS: 50.0000 mg | ORAL_TABLET | Freq: Every day | ORAL | 0 refills | Status: DC

## 2017-08-06 MED ORDER — INSULIN GLARGINE 100 UNIT/ML SOLOSTAR PEN: 40.0000 [IU] | PEN_INJECTOR | Freq: Every day | SUBCUTANEOUS | 0 refills | Status: DC

## 2017-08-06 MED ORDER — LEVOTHYROXINE SODIUM 175 MCG PO TABS: 175.0000 ug | ORAL_TABLET | Freq: Every day | ORAL | 0 refills | Status: DC

## 2017-08-06 NOTE — Patient Instructions (Signed)
I have provided you with a paper prescription for your insulin glargine, levothyroxine, and the losartan for your blood pressure.  Please feel these medications is available and take them as prescribed.  Please be sure to make your appointment on the 19th Dr. Berneice Gandy.  That she may evaluate you and discuss the multiple labs that were ordered today.  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 He believes the corticosteroid injection will assist with the pain after it has had time to take affect.   Thank you for your visit to the [facility].

## 2017-08-06 NOTE — Progress Notes (Signed)
   CC: swelling in the ankles  HPI:  Kayla [name] is a 56 y.o. female who presents today for a 3-week history of left ankle swelling and pain.  She states that there is swelling and tenderness occurs intermittently since her fracture and surgery repair.  However, this most recent event has lasted for 3 weeks without resolve.  She went to see her orthopedic surgeon Dr. Mardelle Matte prior to this visit who imaged the ankle. In addition he attempted to withdraw fluid from a potential effusion but no aspirate was obtained. He in turn injected the join space with corticosteroids as the xray revealed a completely collapsed joint and severe arthritic changes. This provided no relief as per the patient as of her visit, which is to be expected given the time since injection.  The patient has not been on her levothyroxine, insulin, blood pressure medications, or any of her medications as her last appointment was in January of this year.  Patient denied fever, chills, muscle aches, headaches, visual changes, palpitations, chest pain, abdominal pain, or dizziness.  Past Medical History:  Diagnosis Date  . ANA POSITIVE 02/09/2010  . Anal warts 11/09/2012  . Anxiety   . Arthritis    "my bones ache all the time" (06/27/2014)  . Cellulitis 06/27/2014   medial proximal left thigh extending to the perineium  . Chronic bronchitis ([place])    "get it changing of the seasons; sometimes q yr; sometimes not"   . Complication of anesthesia    sats drop post op  . COPD (chronic obstructive pulmonary disease) ([place])   . 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 Diabetic peripheral neuropathy ([place]) since <2005   "hands and feet"  . Dizziness    Multiple falls at home  . Fibromyalgia   . Graves' disease    "they nuked it twice"  . PJKDTOIZ(124.5)    "monthly" (06/27/2014)  . Heart murmur    as a child-never had echo to evaluate  . Hyperlipidemia   . Hypertension   . Hypothyroidism   . Sickle cell  trait ([place])   . Trimalleolar fracture of left ankle 11/21/2014  . Type 2 diabetes mellitus ([place])   . Wears glasses    Review of Systems:  ROS negative except as per HPI.  Physical Exam:  Vitals:   08/06/17 1312  BP: (!) 190/92  Pulse: 77  Temp: 97.8 F (36.6 C)  TempSrc: Oral  SpO2: 97%  Weight: 173 lb 9.6 oz (78.7 kg)  Height: 5\' 2"  (1.575 m)   Physical Exam  Constitutional: She appears well-developed and well-nourished.  Cardiovascular: Normal rate and regular rhythm.  Pulmonary/Chest: Effort normal and breath sounds normal.  Abdominal: Bowel sounds are normal.  Musculoskeletal: She exhibits edema, tenderness and deformity (Of the left ankle area status post surgical repair of her fracture and post traumatic arthritic changes).  Skin: Skin is warm. Capillary refill takes less than 2 seconds.  Nursing note and vitals reviewed.   Assessment & Plan:   See Encounters Tab for problem based charting.  Patient seen with Dr. Daryll Drown.

## 2017-08-07 ENCOUNTER — Ambulatory Visit (HOSPITAL_COMMUNITY)
Admission: RE | Admit: 2017-08-07 | Discharge: 2017-08-07 | Disposition: A | Discharge status: Home / Self Care | Payer: Worker's Compensation | Source: Ambulatory Visit | Attending: Internal Medicine | Admitting: Internal Medicine

## 2017-08-07 ENCOUNTER — Other Ambulatory Visit: Payer: Self-pay | Admitting: Internal Medicine

## 2017-08-07 DIAGNOSIS — M25472 Effusion, left ankle: Secondary | ICD-10-CM

## 2017-08-07 DIAGNOSIS — M7989 Other specified soft tissue disorders: Secondary | ICD-10-CM | POA: Insufficient documentation

## 2017-08-07 DIAGNOSIS — M79605 Pain in left leg: Secondary | ICD-10-CM | POA: Insufficient documentation

## 2017-08-07 DIAGNOSIS — M25572 Pain in left ankle and joints of left foot: Secondary | ICD-10-CM

## 2017-08-07 HISTORY — DX: Pain in left ankle and joints of left foot: M25.572

## 2017-08-07 HISTORY — DX: Effusion, left ankle: M25.472

## 2017-08-07 LAB — CMP14 + ANION GAP
ALT: 21 IU/L (ref 0–32)
AST: 37 IU/L (ref 0–40)
Albumin/Globulin Ratio: 1.3 (ref 1.2–2.2)
Albumin: 4 g/dL (ref 3.5–5.5)
Alkaline Phosphatase: 135 IU/L — ABNORMAL HIGH (ref 39–117)
Anion Gap: 13 mmol/L (ref 10.0–18.0)
BUN/Creatinine Ratio: 11 (ref 9–23)
BUN: 28 mg/dL — ABNORMAL HIGH (ref 6–24)
Bilirubin Total: 0.5 mg/dL (ref 0.0–1.2)
CO2: 19 mmol/L — ABNORMAL LOW (ref 20–29)
Calcium: 9.3 mg/dL (ref 8.7–10.2)
Chloride: 110 mmol/L — ABNORMAL HIGH (ref 96–106)
Creatinine, Ser: 2.54 mg/dL — ABNORMAL HIGH (ref 0.57–1.00)
GFR calc Af Amer: 24 mL/min/1.73 — ABNORMAL LOW
GFR calc non Af Amer: 21 mL/min/1.73 — ABNORMAL LOW
Globulin, Total: 3 g/dL (ref 1.5–4.5)
Glucose: 57 mg/dL — ABNORMAL LOW (ref 65–99)
Potassium: 4.4 mmol/L (ref 3.5–5.2)
Sodium: 142 mmol/L (ref 134–144)
Total Protein: 7 g/dL (ref 6.0–8.5)

## 2017-08-07 LAB — MICROALBUMIN / CREATININE URINE RATIO
Creatinine, Urine: 82 mg/dL
Microalb/Creat Ratio: 4448.4 mg/g creat — ABNORMAL HIGH (ref 0.0–30.0)
Microalbumin, Urine: 3647.7 ug/mL

## 2017-08-07 LAB — CBC
Hematocrit: 26.9 % — ABNORMAL LOW (ref 34.0–46.6)
Hemoglobin: 8.6 g/dL — ABNORMAL LOW (ref 11.1–15.9)
MCH: 31 pg (ref 26.6–33.0)
MCHC: 32 g/dL (ref 31.5–35.7)
MCV: 97 fL (ref 79–97)
Platelets: 174 x10E3/uL (ref 150–379)
RBC: 2.77 x10E6/uL — ABNORMAL LOW (ref 3.77–5.28)
RDW: 12.9 % (ref 12.3–15.4)
WBC: 7.3 x10E3/uL (ref 3.4–10.8)

## 2017-08-07 LAB — TSH: TSH: 228.5 u[IU]/mL — ABNORMAL HIGH (ref 0.450–4.500)

## 2017-08-07 LAB — PROTEIN,TOTAL,URINE: Protein, Ur: 703 mg/dL

## 2017-08-07 LAB — VITAMIN D 25 HYDROXY (VIT D DEFICIENCY, FRACTURES): Vit D, 25-Hydroxy: 4.8 ng/mL — ABNORMAL LOW (ref 30.0–100.0)

## 2017-08-07 NOTE — Progress Notes (Signed)
LLE venous duplex prelim: negative for DVT. Landry Mellow, RDMS, RVT  Called results to Dr. Jodi Mourning.

## 2017-08-07 NOTE — Assessment & Plan Note (Signed)
Assessment: Patient has poorly controlled diabetes mellitus. She stated that she uses insulin when it is available. She does not routinely check her CBG levels at home.  She has not had a HgbA1c since her last visit. Elevated microalbumin  Plan: HgbA1c and CBG POC  Refilled insulin at 40 glargine 40U QHS Ordered repeat Microalbumin and urine creatinine   Addendum: CBG 61 with HgbA1c of 6.5, will need to decrease insulin at this time.  Elevated urine creatinine and microalbumin

## 2017-08-07 NOTE — Assessment & Plan Note (Addendum)
Assessment: See HPI for additional details. Patient presents with a 3-week history of left ankle swelling and pain.  She states that she has this pain intermittently but is failed to resolve recently. Patient was sent over from Dr. Luanna Cole office the orthopedic surgeon following xray imaging, attempted arthrocentesis without fluid recovery, and corticosteroid injection. The patient denied erythema to the surrounding area. Swelling occurs intermittently but typically resolve resolve quickly and is not expanded to this extent in the past.  The area is exquisitely tender to palpation.    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  Surgeon stated that the x-ray imaging performed in their office indicated a bone on bone presentation without cartilaginous buffer.  He states that there is no surgical intervention at this time that would benefit the patient.  POCUS failed to demonstrate fluid collection.  Presentation is most likely consistent with soft tissue edema and/or hematoma.  Given the lack of external signs of hematoma this is most likely soft tissue edema.  There is mild concern for DVT will order an ultrasound of the left lower extremity.  11/8 VAS US performed: Called and verbally informed that the patient is negative for DVT of the left leg.

## 2017-08-07 NOTE — Assessment & Plan Note (Signed)
Assessment: Last assessed in January of this year, will assess and defer additional eval to PCP. Patient has not been taking her synthroid so dose adjustments would be unadvisable at this time.  Attested to generalized fatigue, weakness, constipation, and constant chills. No evidence of hypotension, hypothermia, hypoglycemia or other signs concerning for severe hypothyroidism.   Plan: Filled patients script for levothyroxine at the 175 mcg dose. Please assess further at her PCP visit.  Strongly advised patient

## 2017-08-07 NOTE — Assessment & Plan Note (Signed)
Patient has a markedly elevated urinary creatinine to microalbumin ratio.  This is concerning for significant worsening renal function. Advised patient to follow-up regularly with her PCP, maintain close glucose monitoring and control, maintain his antihypertensive medications

## 2017-08-07 NOTE — Assessment & Plan Note (Addendum)
Last urine protein and creatinine elevated in January. Will repeat today.  See CKD stage III problem above

## 2017-08-07 NOTE — Assessment & Plan Note (Addendum)
Assessment: Patient hypertensive on exam today, BP 190/22, noncompliant with antihypertensives  Plan: We will prescribe patient's losartan 50 mg once daily and check BMP for electrolytes and creatinine. Sodium and potassium within normal limits Patient will need adjustments and titration of her antihypertensive medications   Addendum: Patient's creatinine markedly elevated above baseline

## 2017-08-08 NOTE — Progress Notes (Signed)
Internal Medicine Clinic Attending  I saw and evaluated the patient.  I personally confirmed the key portions of the history and exam documented by Dr. Harbrecht and I reviewed pertinent patient test results.  The assessment, diagnosis, and plan were formulated together and I agree with the documentation in the resident's note.  

## 2017-08-18 ENCOUNTER — Encounter: Payer: Self-pay | Admitting: Internal Medicine

## 2017-09-12 ENCOUNTER — Encounter: Payer: Self-pay | Admitting: Psychology

## 2017-09-19 ENCOUNTER — Encounter: Payer: Self-pay | Admitting: Psychology

## 2017-09-19 ENCOUNTER — Encounter: Payer: Worker's Compensation | Attending: Psychology | Admitting: Psychology

## 2017-09-19 DIAGNOSIS — F411 Generalized anxiety disorder: Secondary | ICD-10-CM | POA: Diagnosis not present

## 2017-09-19 DIAGNOSIS — F419 Anxiety disorder, unspecified: Secondary | ICD-10-CM | POA: Diagnosis not present

## 2017-09-19 DIAGNOSIS — G90523 Complex regional pain syndrome I of lower limb, bilateral: Secondary | ICD-10-CM | POA: Diagnosis not present

## 2017-09-19 DIAGNOSIS — F329 Major depressive disorder, single episode, unspecified: Secondary | ICD-10-CM | POA: Insufficient documentation

## 2017-09-26 ENCOUNTER — Other Ambulatory Visit: Payer: Self-pay | Admitting: *Deleted

## 2017-09-26 DIAGNOSIS — E1140 Type 2 diabetes mellitus with diabetic neuropathy, unspecified: Secondary | ICD-10-CM

## 2017-09-26 DIAGNOSIS — IMO0002 Reserved for concepts with insufficient information to code with codable children: Secondary | ICD-10-CM

## 2017-09-26 DIAGNOSIS — E1165 Type 2 diabetes mellitus with hyperglycemia: Principal | ICD-10-CM

## 2017-09-26 MED ORDER — INSULIN GLARGINE 100 UNIT/ML SOLOSTAR PEN: 40.0000 [IU] | PEN_INJECTOR | Freq: Every day | SUBCUTANEOUS | 0 refills | Status: DC

## 2017-09-26 NOTE — Telephone Encounter (Signed)
Lantus phoned in to [facility] ([place]).

## 2017-10-02 ENCOUNTER — Encounter: Payer: Self-pay | Admitting: Psychology

## 2017-10-02 NOTE — Progress Notes (Signed)
Neuropsychological Consultation   Patient:   Kayla [name]   DOB:   Jun 06, 1961  MR Number:  [id number]  Location:  [facility] [address] [place] [id number] [place] [address] Dept: [phone]           Date of Service:   09/20/2017  Start Time:   9 AM End Time:   10 AM  Provider/Observer:  Ilean Skill, Psy.D.       Clinical Neuropsychologist       Billing Code/Service: [phone] 4 Units  Chief Complaint:    The patient was referred by Dr. Maryjean Ka, Phd, MD for psychological interventions due to reactive depression and anxiety subsequent to complex regional pain syndrome in her lower extremities.  The patient's primary focus is in her left lower extremity that is a result of a work-related injury that she had in 2016.  Previously, the patient has had multiple lumbar sympathetic blocks which provided excellent improvement in her symptoms but ultimately lasted only short periods of time.  The patient is also had lumbar sympathetic ablation and received 3 weeks of excellent relief but her symptoms began to return.  They have been discussing the possibility of a spinal cord stimulator but at this point the patient is rather anxious about other interventions of a surgical nature.  The patient has been experiencing increasing anxiety and depression symptoms lately and there was concerns about the emotional impact this chronic pain and loss of function is having on her psychological status.  Reason for Service:  The patient was referred for psychological intervention due to resulting anxiety and depressive symptoms subsequent to complex regional pain syndrome of her left lower extremity.  The patient reports that she is in constant pain primarily in her left ankle.  She experiences significant swelling and extremely painful sensations.  The patient reports that this pain is cause problems  with attention and concentration and neck increasing disturbance on her mood status.  The patient reports that she does not like taking pain medications and they have also been working to try things such as Cymbalta and gabapentin.  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  She does report that her anxiety and depression worsened as her pain worsens.  The patient does have some pain in her right leg as well that they feel is due to neuropathy and complex regional pain syndrome.  However, the primary pain is in her left ankle.  The patient's current medications include gabapentin, Cymbalta, and Topamax.  Current Status:  The patient reports worsening symptoms of depression and anxiety that are related to her complex regional pain syndrome.  Reliability of Information: The information is derived from a 1 hour face-to-face clinical interview with the patient as well as review of available medical records.  Behavioral Observation: Kayla [name]  presents as a 57 y.o.-year-old Right African American Female who appeared her stated age. her dress was Appropriate and she was Well Groomed and her manners were Appropriate to the situation.  her participation was indicative of Appropriate and Attentive behaviors.  There were any physical disabilities noted.  she displayed an appropriate level of cooperation and motivation.     Interactions:    Active Appropriate and Redirectable  Attention:   abnormal and attention span appeared shorter than expected  for age  Memory:   within normal limits; recent and remote memory intact  Visuo-spatial:  not examined  Speech (Volume):  normal  Speech:   normal;   Thought Process:  Coherent and Relevant  Though Content:  WNL; not suicidal  Orientation:   person, place, time/date and  situation  Judgment:   Good  Planning:   Good  Affect:    Anxious and Depressed  Mood:    Anxious, Depressed and Dysphoric  Insight:   Good  Intelligence:   normal   Medical History:   Past Medical History:  Diagnosis Date  . ANA POSITIVE 02/09/2010  . Anal warts 11/09/2012  . Anxiety   . Arthritis    "my bones ache all the time" (06/27/2014)  . Cellulitis 06/27/2014   medial proximal left thigh extending to the perineium  . Chronic bronchitis ([place])    "get it changing of the seasons; sometimes q yr; sometimes not"   . Complication of anesthesia    sats drop post op  . COPD (chronic obstructive pulmonary disease) ([place])   . 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 Diabetic peripheral neuropathy ([place]) since <2005   "hands and feet"  . Dizziness    Multiple falls at home  . Fibromyalgia   . Graves' disease    "they nuked it twice"  . LNLGXQJJ(941.7)    "monthly" (06/27/2014)  . Heart murmur    as a child-never had echo to evaluate  . Hyperlipidemia   . Hypertension   . Hypothyroidism   . Sickle cell trait ([place])   . Trimalleolar fracture of left ankle 11/21/2014  . Type 2 diabetes mellitus ([place])   . Wears glasses        Psychiatric History:  The patient denies any significant prior psychiatric history.  Family Med/Psych History:  Family History  Problem Relation Age of Onset  . Emphysema Mother   . Cancer Mother        unknown  . Hypertension Mother   . Cancer Father        pancreatic   . Diabetes Father   . Hypertension Father   . Down syndrome Brother   . Cancer Maternal Aunt        cancer  . Cancer Other        breast cancer aunt  . Stroke Sister   . Diabetes Sister   . Colon cancer Neg Hx   . Stomach cancer Neg Hx     Risk of Suicide/Violence: low patient denies any suicidal or homicidal ideation.  Impression/DX:  The patient was referred by Dr. Maryjean Ka, Phd, MD for psychological interventions due to reactive depression and  anxiety subsequent to complex regional pain syndrome in her lower extremities.  The patient's primary focus is in her left lower extremity that is a result of a work-related injury that she had in 2016.  Previously, the patient has had multiple lumbar sympathetic blocks which provided excellent improvement in her symptoms but ultimately lasted only short periods of time.  The patient is also had lumbar sympathetic ablation and received 3 weeks of excellent relief but her symptoms began to return.  They have been discussing the possibility of a spinal cord stimulator but at this point the patient is rather anxious about other interventions of a surgical nature.  The patient has been experiencing increasing anxiety and depression symptoms lately and there was concerns about the emotional impact this chronic pain and  loss of function is having on her psychological status.  The patient reports worsening symptoms of depression and anxiety that are related to her complex regional pain syndrome.   Disposition/Plan:  We will initially set the patient up for individual therapeutic interventions to try to build better coping skills and strategies to deal with her chronic pain issues and issues related to anxiety and depression subsequent to her general medical condition.  Ultimately, the plan is to see if possible spinal cord stimulator trialing would be appropriate for the patient once the underlying psychological distress is better managed.  Diagnosis:    Complex regional pain syndrome type 1 of both lower extremities  Reactive depression  Anxiety state         Electronically Signed   _______________________ Ilean Skill, Psy.D.

## 2017-10-03 ENCOUNTER — Other Ambulatory Visit: Payer: Self-pay | Admitting: *Deleted

## 2017-10-03 DIAGNOSIS — E890 Postprocedural hypothyroidism: Secondary | ICD-10-CM

## 2017-10-06 ENCOUNTER — Ambulatory Visit (INDEPENDENT_AMBULATORY_CARE_PROVIDER_SITE_OTHER): Payer: Self-pay | Admitting: Internal Medicine

## 2017-10-06 ENCOUNTER — Encounter: Payer: Self-pay | Admitting: Internal Medicine

## 2017-10-06 ENCOUNTER — Other Ambulatory Visit: Payer: Self-pay

## 2017-10-06 VITALS — BP 149/67 | HR 72 | Temp 97.7°F | Ht 62.0 in | Wt 161.3 lb

## 2017-10-06 DIAGNOSIS — Z79890 Hormone replacement therapy (postmenopausal): Secondary | ICD-10-CM

## 2017-10-06 DIAGNOSIS — E1140 Type 2 diabetes mellitus with diabetic neuropathy, unspecified: Secondary | ICD-10-CM

## 2017-10-06 DIAGNOSIS — N183 Chronic kidney disease, stage 3 unspecified: Secondary | ICD-10-CM

## 2017-10-06 DIAGNOSIS — I10 Essential (primary) hypertension: Secondary | ICD-10-CM

## 2017-10-06 DIAGNOSIS — M25572 Pain in left ankle and joints of left foot: Secondary | ICD-10-CM

## 2017-10-06 DIAGNOSIS — I129 Hypertensive chronic kidney disease with stage 1 through stage 4 chronic kidney disease, or unspecified chronic kidney disease: Secondary | ICD-10-CM

## 2017-10-06 DIAGNOSIS — E0500 Thyrotoxicosis with diffuse goiter without thyrotoxic crisis or storm: Secondary | ICD-10-CM

## 2017-10-06 DIAGNOSIS — G8929 Other chronic pain: Secondary | ICD-10-CM

## 2017-10-06 DIAGNOSIS — IMO0002 Reserved for concepts with insufficient information to code with codable children: Secondary | ICD-10-CM

## 2017-10-06 DIAGNOSIS — M797 Fibromyalgia: Secondary | ICD-10-CM

## 2017-10-06 DIAGNOSIS — Z79899 Other long term (current) drug therapy: Secondary | ICD-10-CM

## 2017-10-06 DIAGNOSIS — E1165 Type 2 diabetes mellitus with hyperglycemia: Secondary | ICD-10-CM

## 2017-10-06 DIAGNOSIS — E1122 Type 2 diabetes mellitus with diabetic chronic kidney disease: Secondary | ICD-10-CM

## 2017-10-06 DIAGNOSIS — Z794 Long term (current) use of insulin: Secondary | ICD-10-CM

## 2017-10-06 DIAGNOSIS — R464 Slowness and poor responsiveness: Secondary | ICD-10-CM

## 2017-10-06 DIAGNOSIS — F329 Major depressive disorder, single episode, unspecified: Secondary | ICD-10-CM

## 2017-10-06 DIAGNOSIS — E890 Postprocedural hypothyroidism: Secondary | ICD-10-CM

## 2017-10-06 DIAGNOSIS — F419 Anxiety disorder, unspecified: Secondary | ICD-10-CM

## 2017-10-06 DIAGNOSIS — Z8781 Personal history of (healed) traumatic fracture: Secondary | ICD-10-CM

## 2017-10-06 MED ORDER — LOSARTAN POTASSIUM 50 MG PO TABS: 50.0000 mg | ORAL_TABLET | Freq: Every day | ORAL | 0 refills | Status: DC

## 2017-10-06 MED ORDER — LOSARTAN POTASSIUM 50 MG PO TABS: 50.0000 mg | ORAL_TABLET | Freq: Every day | ORAL | 3 refills | Status: DC

## 2017-10-06 MED ORDER — LEVOTHYROXINE SODIUM 175 MCG PO TABS: 175.0000 ug | ORAL_TABLET | Freq: Every day | ORAL | 3 refills | Status: DC

## 2017-10-06 NOTE — Progress Notes (Signed)
CC: Hypertension follow up  HPI:  [name] is a 57 y.o. with PMH of HTN, iatrogenic hypothyroidism 2/2 RAI ablation for Grave's disease, T2DM, major depression, anxiety, and fibromyalgia who presents for evaluation of multiple medical conditions. Patient last seen in clinic on 08/06/17. At that visit she had not been to doctor in 11 months and had not been taking most of her medications. Her TSH >100, Cr elevated to 2.5 from last recorded of 1.8, and UPC ratio elevated >4,400. Patient restarted on synthroid 175 mcg and losartan 50 mg at that visit. Since then the patient states she has felt OK. She has been taking her medications daily without difficulty, now that she has orange card and can afford medications. Patient complains of chronic L ankle pain and swelling since she broke her ankle >1 year ago. Patient states she has been out of work since her ankle was broken and this makes her depressed. She denies any difficulties getting around with her cane and states she refuses to use a walker because it hurts her back and makes her too tired. She also doesn't like taking a lot of medications and states that she wishes she didn't have to take so many pills on a day to day basis. She takes most of these medications at night because they make her more tired than she already is at baseline.  Past Medical History: Past Medical History:  Diagnosis Date  . ANA POSITIVE 02/09/2010  . Anal warts 11/09/2012  . Anxiety   . Arthritis    "my bones ache all the time" (06/27/2014)  . Cellulitis 06/27/2014   medial proximal left thigh extending to the perineium  . Chronic bronchitis ([place])    "get it changing of the seasons; sometimes q yr; sometimes not"   . Complication of anesthesia    sats drop post op  . COPD (chronic obstructive pulmonary disease) ([place])   . 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 Diabetic peripheral neuropathy ([place]) since <2005   "hands and feet"  . Dizziness     Multiple falls at home  . Fibromyalgia   . Graves' disease    "they nuked it twice"  . OYDXAJOI(786.7)    "monthly" (06/27/2014)  . Heart murmur    as a child-never had echo to evaluate  . Hyperlipidemia   . Hypertension   . Hypothyroidism   . Sickle cell trait ([place])   . Trimalleolar fracture of left ankle 11/21/2014  . Type 2 diabetes mellitus ([place])   . Wears glasses    Review of Systems:  Patient endorses L ankle pain, fatigue, worsening depression as per HPI Patient denies chest pain, shortness of breath, abdominal pain, diaphoresis, nausea/vomiting, lower extremity swelling, and change in bowel/bladder habits.  Physical Exam:  Vitals:   10/06/17 1552  BP: (!) 149/67  Pulse: 72  Temp: 97.7 F (36.5 C)  TempSrc: Oral  SpO2: 100%  Weight: 161 lb 4.8 oz (73.2 kg)  Height: 5\' 2"  (1.575 m)   Physical Exam  Constitutional: She is oriented to person, place, and time.  African [place] woman sitting in wheelchair intermittently twitching full body, which spontaneously resolves when she is distracted by examiner.   HENT:  Swelling of upper lip with well healing 1 cm cut on the underside of lip; no erythema, purulent discharge or signs of infection.   Eyes: EOM are normal. Pupils are equal, round, and reactive to light. Right eye exhibits no  discharge. Left eye exhibits no discharge.  Cardiovascular: Normal rate, regular rhythm and intact distal pulses. Exam reveals no friction rub.  No murmur heard. GI: Soft. Bowel sounds are normal. She exhibits no distension. There is no tenderness. There is no rebound.  Musculoskeletal: She exhibits edema (L ankle compared to right) and tenderness (Diffuse tenderness with light palpation of bilateral lower extremities).  Neurological: She is alert and oriented to person, place, and time.  Face strength and sensation intact bilaterally. Tongue midline. Patient would not cooperate with formal strength testing 2/2 pain with light palpation  during exam. Spontaneously moves all 4 extremities. Intermittently twitching bilateral upper extremities and torso during exam, but twitching resolves when answering examiners questions. No focal twitching observed.  Skin: Skin is warm and dry. No rash noted. No erythema.  Psychiatric: Judgment and thought content normal.  Patient was slow to respond to some questions during exam. Answers appropriate.    Assessment & Plan:   See Encounters Tab for problem based charting.  Patient seen with Dr. Dareen Piano

## 2017-10-06 NOTE — Assessment & Plan Note (Addendum)
Patient's last TSH elevated >100. Patient reports compliance with 175 mcg daily of synthroid. Patient exhibited psychomotor slowing throughout exam today, which may indicate that thyroid function not fully restored at current synthroid dose. Patient also notes depression and fatigue on ROS, further suggesting hypothyroidism on current dose. Will plan to check TSH today and adjust synthroid as necessary.  Plan: -TSH today -Continue 175 mcg daily -RTC in 8 weeks for repeat labs and physical exam  Addendum: Patient's TSH resulted >220, stable from 07/2017 when she wasn't taking her medications. Patient called with results. Patient states that she has been taking medications every day since she saw me earlier this week, but admits she wasn't taking it every day before she saw me. Will not make medication changes at this time 2/2 unsure medication compliance. Stressed the importance of taking this medication every day to prevent further fatigue, electrolyte abnormalities, and psychomotor slowing. Patient ensured me that she dose not have difficulty obtaining this medication and that she will take it daily until she sees me again. She was told to call the clinic if she should have any worrisome new symptoms.

## 2017-10-06 NOTE — Patient Instructions (Signed)
Thank you for seeing [place] in the clinic today!  You were evaluated for high blood pressure. Please continue taking your insulin (20 mg BID), losartan 50 mg daily, and synthroid 175 mcg daily. You had blood work today to see if your medications are working. I will call you with the results and let you know if you need to make changes to your regimen.  Please return to the clinic in 6-8 weeks for follow up of your chronic medical problems.  If you have any questions or concerns, please call our clinic at [phone] between the hours of 9am-5pm. If you have a problem after these hours, please call [phone] and ask for the internal medicine resident on call. If you feel you are having a medical emergency please call 911.   Thanks, Dr. Larena Glassman Ninette Cotta

## 2017-10-06 NOTE — Assessment & Plan Note (Signed)
BP near goal today of 140/90. Patient reports taking BP at home and states that it is usually lower than today's office reading, with numbers in 130s. Patient reports compliance with daily losartan 50 mg. Will obtain BMP today and consider adding second agent if uncontrolled at next visit and/or worsening of kidney function.   Plan: -Continue losartan 50 mg today -BMP today -Add HCTZ 25 mg at next visit if BP uncontrolled

## 2017-10-06 NOTE — Assessment & Plan Note (Addendum)
Patient reports good urine output. Will obtain BMP today. I am hopeful that Cr will return to baseline around 1.8 (seen at previous visits) now that patient on appropriate regimen for BP control.  Plan: -Strict BP, T2DM control -BMP today  Addendum: Patient's BMP showed Cr/GFR still abnormal from that observed 2 months ago. Patient will need continued strict BP control and referral to nephrologist at next visit. Patient called with this information, informed of importance of taking medications every day as prescribed to prevent progression of CKD.

## 2017-10-06 NOTE — Assessment & Plan Note (Signed)
Patient reports complaince with Lantus 20 units BID. Patient denies symptomatic hypoglycemia, but has not brought glucose log to today's visit. Hgb A1C on 08/06/2018 = 6.5%, indicating good blood glucose control at baseline. Patient had significantly elevated UPC at last visit. Will need to continue ARB for BP control.  Plan: -Continue Lantus 20 units BID -ARB given elevated UPC -A1C at next follow up visit, consider decreasing insulin or switching to oral regimen at this time.

## 2017-10-07 LAB — BMP8+ANION GAP
Anion Gap: 14 mmol/L (ref 10.0–18.0)
BUN/Creatinine Ratio: 9 (ref 9–23)
BUN: 22 mg/dL (ref 6–24)
CO2: 18 mmol/L — ABNORMAL LOW (ref 20–29)
Calcium: 9.6 mg/dL (ref 8.7–10.2)
Chloride: 108 mmol/L — ABNORMAL HIGH (ref 96–106)
Creatinine, Ser: 2.51 mg/dL — ABNORMAL HIGH (ref 0.57–1.00)
GFR calc Af Amer: 24 mL/min/{1.73_m2} — ABNORMAL LOW (ref >59)
GFR calc non Af Amer: 21 mL/min/{1.73_m2} — ABNORMAL LOW (ref >59)
Glucose: 85 mg/dL (ref 65–99)
Potassium: 4.7 mmol/L (ref 3.5–5.2)
Sodium: 140 mmol/L (ref 134–144)

## 2017-10-07 LAB — TSH: TSH: 225.6 u[IU]/mL — ABNORMAL HIGH (ref 0.450–4.500)

## 2017-10-07 NOTE — Progress Notes (Signed)
Internal Medicine Clinic Attending  I saw and evaluated the patient.  I personally confirmed the key portions of the history and exam documented by Dr. Nedrud and I reviewed pertinent patient test results.  The assessment, diagnosis, and plan were formulated together and I agree with the documentation in the resident's note.  

## 2017-11-06 ENCOUNTER — Encounter: Payer: Self-pay | Admitting: Psychology

## 2017-12-15 ENCOUNTER — Ambulatory Visit (INDEPENDENT_AMBULATORY_CARE_PROVIDER_SITE_OTHER): Payer: Self-pay | Admitting: Internal Medicine

## 2017-12-15 ENCOUNTER — Other Ambulatory Visit: Payer: Self-pay | Admitting: Internal Medicine

## 2017-12-15 ENCOUNTER — Encounter: Payer: Self-pay | Admitting: Internal Medicine

## 2017-12-15 ENCOUNTER — Encounter (INDEPENDENT_AMBULATORY_CARE_PROVIDER_SITE_OTHER): Payer: Self-pay

## 2017-12-15 ENCOUNTER — Other Ambulatory Visit: Payer: Self-pay

## 2017-12-15 VITALS — BP 165/73 | HR 80 | Temp 97.8°F | Ht 62.0 in | Wt 146.8 lb

## 2017-12-15 DIAGNOSIS — I129 Hypertensive chronic kidney disease with stage 1 through stage 4 chronic kidney disease, or unspecified chronic kidney disease: Secondary | ICD-10-CM

## 2017-12-15 DIAGNOSIS — Z79890 Hormone replacement therapy (postmenopausal): Secondary | ICD-10-CM

## 2017-12-15 DIAGNOSIS — M65332 Trigger finger, left middle finger: Secondary | ICD-10-CM | POA: Insufficient documentation

## 2017-12-15 DIAGNOSIS — I10 Essential (primary) hypertension: Secondary | ICD-10-CM

## 2017-12-15 DIAGNOSIS — IMO0002 Reserved for concepts with insufficient information to code with codable children: Secondary | ICD-10-CM

## 2017-12-15 DIAGNOSIS — E1122 Type 2 diabetes mellitus with diabetic chronic kidney disease: Secondary | ICD-10-CM

## 2017-12-15 DIAGNOSIS — N184 Chronic kidney disease, stage 4 (severe): Secondary | ICD-10-CM

## 2017-12-15 DIAGNOSIS — Z79899 Other long term (current) drug therapy: Secondary | ICD-10-CM

## 2017-12-15 DIAGNOSIS — Z9181 History of falling: Secondary | ICD-10-CM

## 2017-12-15 DIAGNOSIS — G8929 Other chronic pain: Secondary | ICD-10-CM

## 2017-12-15 DIAGNOSIS — E1140 Type 2 diabetes mellitus with diabetic neuropathy, unspecified: Secondary | ICD-10-CM

## 2017-12-15 DIAGNOSIS — Z794 Long term (current) use of insulin: Secondary | ICD-10-CM

## 2017-12-15 DIAGNOSIS — E1165 Type 2 diabetes mellitus with hyperglycemia: Principal | ICD-10-CM

## 2017-12-15 DIAGNOSIS — Z9114 Patient's other noncompliance with medication regimen: Secondary | ICD-10-CM

## 2017-12-15 DIAGNOSIS — E890 Postprocedural hypothyroidism: Secondary | ICD-10-CM

## 2017-12-15 HISTORY — DX: Trigger finger, left middle finger: M65.332

## 2017-12-15 LAB — GLUCOSE, CAPILLARY: Glucose-Capillary: 75 mg/dL (ref 65–99)

## 2017-12-15 LAB — POCT GLYCOSYLATED HEMOGLOBIN (HGB A1C): Hemoglobin A1C: 5.3

## 2017-12-15 MED ORDER — TRIAMCINOLONE 0.1 % CREAM:EUCERIN CREAM 1:1: 1.0000 "application " | TOPICAL_CREAM | Freq: Two times a day (BID) | CUTANEOUS | 0 refills | Status: DC

## 2017-12-15 MED ORDER — INSULIN GLARGINE 100 UNIT/ML SOLOSTAR PEN: 20.0000 [IU] | PEN_INJECTOR | Freq: Every day | SUBCUTANEOUS | 0 refills | Status: DC

## 2017-12-15 MED FILL — TRIAM0.1%/EUCERIN 1:1: 15 days supply | Qty: 60 | Fill #0

## 2017-12-15 NOTE — Assessment & Plan Note (Addendum)
Patient reports compliance with daily 175 mcg synthroid during today's visit. Patient still has signs and symptoms of hypothyroidism on exam, including dry skin, brittle hair, and psychomotor slowing. Will plan to check TSH today and adjust medication appropriately.  Plan: -TSH today -RTC in 6-8 months -Triamcinolone with Eucerin cream provided for relief of new, itchy dry skin patches on chest, back and lower extremities

## 2017-12-15 NOTE — Patient Instructions (Addendum)
Thank you for seeing [place] in the clinic today!  You were evaluated for hypothyroidism, high blood pressure, and diabetes.   For your hypothyroidism, please continue taking your medication every day. I will call you with the results of your blood test.  For your high blood pressure. Please take your medication every day.   For your diabetes. Your A1C was 5.3% today, which is low for someone with diabetes. Please decrease the amount of insulin you take everyday. Instead of taking 40 units per day please take 20 units.   We need you to bring all of your medications and glucometer to your next visit. I would like you to meet with the clinical pharmacist to discuss all of your medications. Please bring all of your pill bottles to this appointment.   Please return to the clinic in 6-8 weeks for follow up of your thyroid and high blood pressure.  If you have any questions or concerns, please call our clinic at [phone] between the hours of 9am-5pm. If you have a problem after these hours, please call [phone] and ask for the internal medicine resident on call. If you feel you are having a medical emergency please call 911.   Thanks, Dr. Larena Glassman Faiz Weber

## 2017-12-15 NOTE — Assessment & Plan Note (Addendum)
Patient's last GFR 24 with significant proteinuria. Patient needs strict BP and DM management. Patient's DM under good control but patient not compliant with BP medications as previously discussed. Patient stated her goal is to avoid dialysis and patient  Plan: -Strict BP and DM control -Continue ARB for BP management, pharmacy assistance appreciated -Repeat BMP at follow up if patient compliant with ARB therapy at that time to assess improvement.

## 2017-12-15 NOTE — Assessment & Plan Note (Signed)
Patient states that she hasn't been taking her medications daily because of side effects. Significant proteinuria noted on labs in 08/2017 and patient with CKD, so last visit prescribed ARB as patient intolerant of ACE inhibitor. Patient states she received olmesartan from health department rather than losartan but unsure of dose. Patient also states she doesn't like to take this medication because of mild headache. Unclear how often this patient takes medicine. Patient will be referred to clinical pharmacist for medication review and assistance with ensuring patient can afford her medication.   Discussed importance of taking BP medication to prevent further damage to her kidneys. Also stated that ARB would be medication of choice given known CKD. Patient expressed that her goal of coming to doctor is to avoid going on dialysis because of her kidney disease. She did not seem to get connection between this goal and taking daily BP medications, as she expressed frustration with having to take so many medications during her visit.  Plan: -Continue ARB (?olmesartan from health dept but have losartan on file) -Referral to clinical pharmacist for medication assistance (cost, availability, understanding).

## 2017-12-15 NOTE — Assessment & Plan Note (Signed)
Patient's A1C below goal of 6-7% today (5.3%). Patient takes total of 40 units of insulin a day. She was instructed to take a total of 20 units rather than 40 units given this drop in A1C since last measured. Patient likes to take medication BID, so instructed to only take 10 units BID if she does this. Told for ease she can take 20 units at night so that she only has to give herself one injection a day. Patient did not seem amenable to this change.  Plan: -Lantus 20 units qhs (or 10 units BID if patient insists on BID dosing) -RTC in 6-8 weeks for follow up

## 2017-12-15 NOTE — Assessment & Plan Note (Signed)
Patient doesn't not have signs or symptoms of joint swelling on exam or limited range of motion. Her middle finger on her L hand did, however, lock in flexion at the PIP joint and required physician to move joint back into extension. This finding is most consistent with trigger finger and patient will be referred to sports medicine for treatment  Plan: -Sports medicine referral

## 2017-12-15 NOTE — Progress Notes (Signed)
   CC: Hypothyroidism  HPI:  [name] is a 57 y.o. 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 Since her last visit patient states she has been compliant with daily synthroid therapy, which she had self discontinued sometime last year. Patient states that she hasn't been taking her blood pressure medication every day because that medication gave her a severe headache and she didn't want to continue taking it. Patient reports worsening of her chronic body aches/pains, stating that pain throughout her body impedes her ability to ambulate. Patient also complains of recent falls 2/2 worsening hand pain and difficulty moving her L fingers. Patient endorses cold intolerance, alopecia with brittling of her hair, chronic dry and itchy skin, excessive sleepiness, and decreased appetite.  Past Medical History: Past Medical History:  Diagnosis Date  . ANA POSITIVE 02/09/2010  . Anal warts 11/09/2012  . Anxiety   . Arthritis    "my bones ache all the time" (06/27/2014)  . Cellulitis 06/27/2014   medial proximal left thigh extending to the perineium  . Chronic bronchitis ([place])    "get it changing of the seasons; sometimes q yr; sometimes not"   . Complication of anesthesia    sats drop post op  . COPD (chronic obstructive pulmonary disease) ([place])   . 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 Diabetic peripheral neuropathy ([place]) since <2005   "hands and feet"  . Dizziness    Multiple falls at home  . Fibromyalgia   . Graves' disease    "they nuked it twice"  . YHCWCBJS(283.1)    "monthly" (06/27/2014)  . Heart murmur    as a child-never had echo to evaluate  . Hyperlipidemia   . Hypertension   . Hypothyroidism   . Sickle cell trait ([place])   . Trimalleolar fracture of left ankle 11/21/2014  . Type 2 diabetes mellitus ([place])   . Wears glasses    Review of Systems:    Patient denies chest pain, shortness of breath, abdominal pain, diaphoresis, nausea/vomiting, lower extremity swelling, and change in bowel/bladder habits.  Physical Exam:  Vitals:   12/15/17 1448  BP: (!) 185/86  Pulse: 86  Temp: 97.8 F (36.6 C)  TempSrc: Oral  SpO2: 100%  Weight: 146 lb 12.8 oz (66.6 kg)  Height: 5\' 2"  (1.575 m)   Physical Exam  Constitutional: No distress.  Cardiovascular: Normal rate, regular rhythm and intact distal pulses. Exam reveals no friction rub.  No murmur heard. Respiratory: Effort normal. No respiratory distress. She has no wheezes. She has no rales.  GI: She exhibits no distension. There is no tenderness. There is no rebound.  Musculoskeletal: She exhibits no edema (of bilateral lower extremities) or tenderness (of bilateral lower extremities).  Patient's bilateral MCP joints without effusion and erythema. Good range of motion with wrist and digits. While assessing motor strength of bilateral wrists, fingers patient's L middle finger got stuck in flexion position and required physician to extend it as patient unable to do so on her own.  Skin: Skin is warm and dry. She is not diaphoretic.  Chronic dry skin on bilateral lower extremities. Areas of hyperpigmentation noted on chest and upper back, with corresponding dry skin overlying these areas.     Assessment & Plan:   See Encounters Tab for problem based charting.  Patient discussed with Dr. Dareen Piano

## 2017-12-16 LAB — TSH: TSH: 6.06 u[IU]/mL — ABNORMAL HIGH (ref 0.450–4.500)

## 2017-12-16 NOTE — Progress Notes (Signed)
Internal Medicine Clinic Attending  Case discussed with Dr. Nedrud at the time of the visit.  We reviewed the resident's history and exam and pertinent patient test results.  I agree with the assessment, diagnosis, and plan of care documented in the resident's note.  

## 2017-12-18 ENCOUNTER — Telehealth: Payer: Self-pay | Admitting: Pharmacist

## 2017-12-22 ENCOUNTER — Ambulatory Visit (INDEPENDENT_AMBULATORY_CARE_PROVIDER_SITE_OTHER): Payer: Self-pay | Admitting: Sports Medicine

## 2017-12-22 ENCOUNTER — Other Ambulatory Visit: Payer: Self-pay | Admitting: *Deleted

## 2017-12-22 DIAGNOSIS — M65332 Trigger finger, left middle finger: Secondary | ICD-10-CM

## 2017-12-22 DIAGNOSIS — E1140 Type 2 diabetes mellitus with diabetic neuropathy, unspecified: Secondary | ICD-10-CM

## 2017-12-22 DIAGNOSIS — E1165 Type 2 diabetes mellitus with hyperglycemia: Principal | ICD-10-CM

## 2017-12-22 DIAGNOSIS — IMO0002 Reserved for concepts with insufficient information to code with codable children: Secondary | ICD-10-CM

## 2017-12-22 MED ORDER — METHYLPREDNISOLONE ACETATE 40 MG/ML IJ SUSP
20.0000 mg | Freq: Once | INTRAMUSCULAR | Status: AC
  Administered 2017-12-22: 20 mg via INTRA_ARTICULAR

## 2017-12-22 NOTE — Progress Notes (Signed)
   HPI  Kayla [name] is here with left middle finger pain and locking for about 6 months duration. This is pain on the palmar surface over the 3rd MPJ. She also has locking of her finger when flexing it past about 90 degrees requiring help or her other hand to extend again. She denies any previous injury or procedures on her left hand. She is walking with a cane much of the time since 2017 due to ankle fracture. She saw her PCP for this problem and was referred to Sports Medicine clinic for trigger finger treatment.  CC: Left middle trigger finger  Medications/Interventions Tried: none  See HPI and/or previous note for associated ROS.  Objective: BP 130/88   Ht 5\' 2"  (1.575 m)   Wt 148 lb (67.1 kg)   LMP 01/12/2014   BMI 27.07 kg/m  Gen: Right-Hand Dominant. NAD, well groomed, normal affect.  CV: Well-perfused. Warm hands. Resp: Non-labored.  Neuro: Sensation intact throughout. Gross coordination deficits.  Gait: Walking with cane mildly favoring weight bearing on right MSK: Left hand is tender to palpation over and proximal to the 3rd MPJ on the palmar side, finger requires forcible extension after flexion to about 90 degrees, there is no overlying erythema or soft tissue swelling, grip strength is roughly symmetric to right hand, ROM is normal in other digits   Assessment and plan:  Trigger finger, left middle finger Her exam is consistent with trigger finger of the left middle finger. Her pain is significant with impaired ROM, catching, and has been ongoing for months. The pain is prominent which would not be expected from Dupuytren's contracture or diabetic cheiroarthropathy.  She has well controlled diabetes and injection steroid dose is small. We will perform steroid injection at the flexor tendon today. If symptoms do not improve with this procedure within a few weeks she would need to call back and could be referred to her orthopedist Dr. Mardelle Matte for surgical management. Otherwise  can follow up PRN.  Consent obtained and verified. Time-out conducted. Noted no overlying erythema, induration, or other signs of local infection. Skin prepped in a sterile fashion with alcohol swabs. Topical analgesic spray: Ethyl chloride. Joint: Superficial to left 3rd MPJ at flexor tendon Needle: 25g x 5/8" Completed without difficulty. Meds: 0.5cc 1% xylocaine 0.5cc depo-medrol 40mg /mL  Dr. Micheline Chapman was present throughout the procedure in a supervising capacity. Advised to call if fevers/chills, erythema, induration, drainage, or persistent bleeding.    Meds ordered this encounter  Medications  . methylPREDNISolone acetate (DEPO-MEDROL) injection 20 mg   [name], MD PGY-III Internal Medicine Resident 12/22/2017, 4:08 PM   Patient seen and evaluated with the resident. I agree with the above plan of care. Patient has obvious triggering of the left middle finger. Cortisone injection administered without difficulty. If symptoms persist consider merits of referral to orthopedics for consideration of A1 pulley release. Follow-up as needed.

## 2017-12-22 NOTE — Assessment & Plan Note (Addendum)
Her exam is consistent with trigger finger of the left middle finger. Her pain is significant with impaired ROM, catching, and has been ongoing for months. The pain is prominent which would not be expected from Dupuytren's contracture or diabetic cheiroarthropathy.  She has well controlled diabetes and injection steroid dose is small. We will perform steroid injection at the flexor tendon today. If symptoms do not improve with this procedure within a few weeks she would need to call back and could be referred to her orthopedist Dr. Mardelle Matte for surgical management. Otherwise can follow up PRN.

## 2017-12-23 ENCOUNTER — Encounter: Payer: Self-pay | Admitting: Sports Medicine

## 2018-01-07 ENCOUNTER — Other Ambulatory Visit: Payer: Self-pay | Admitting: *Deleted

## 2018-01-07 DIAGNOSIS — E890 Postprocedural hypothyroidism: Secondary | ICD-10-CM

## 2018-01-07 MED ORDER — LEVOTHYROXINE SODIUM 175 MCG PO TABS: 175.0000 ug | ORAL_TABLET | Freq: Every day | ORAL | 3 refills | Status: DC

## 2018-01-07 NOTE — Telephone Encounter (Signed)
Received #90 tabs on 10/07/17 per MAP program. Thanks

## 2018-01-20 NOTE — Progress Notes (Signed)
Contacted patient to help with medications. Patient states she is due to follow up with [facility] and will schedule an appointment. Advised patient to meet with me the same day, or I can follow up once [facility] application is completed. Patient verbalized understanding.

## 2018-01-27 ENCOUNTER — Telehealth: Payer: Self-pay | Admitting: *Deleted

## 2018-01-27 NOTE — Telephone Encounter (Signed)
Received fax from [facility]. with the following: "Patient states she will not take losartan as it makes her sick-states all blood pressure and cholesterol meds make her sick and lethargic. She reports she has informed her provider." Wanting to know if losartan has been discontinued. Hubbard Hartshorn, RN, BSN

## 2018-01-28 NOTE — Telephone Encounter (Signed)
Patient stated that she became sick and lethargic with ACE inhibitor at last visit (med list allergy noted). This is why she was switched to ARB instead. At last visit patient experienced mild headache with olmesartan. We switched to losartan at last visit for cost/health department availability, renal protection, and concern for side effect of headache with olmesartan. Patient should make appointment to discuss medications further with physician and/or clinical pharmacist as suggested in my last clinic note.   Would not recommend discontinuation of ARB without meeting with physician to go over side effects and need for renal protection in setting of ongoing CKD 2/2 uncontrolled hypertension.

## 2018-01-29 NOTE — Telephone Encounter (Signed)
Called pt - no answer; left message on recorder, identified as her,to scheduled an appt to discuss meds/ side effects and her doctor recommend not stopping losartan per Dr Berneice Gandy.

## 2018-02-27 ENCOUNTER — Ambulatory Visit: Payer: Self-pay

## 2018-03-12 ENCOUNTER — Other Ambulatory Visit: Payer: Self-pay | Admitting: *Deleted

## 2018-03-12 ENCOUNTER — Other Ambulatory Visit: Payer: Self-pay

## 2018-03-12 DIAGNOSIS — IMO0002 Reserved for concepts with insufficient information to code with codable children: Secondary | ICD-10-CM

## 2018-03-12 DIAGNOSIS — E1165 Type 2 diabetes mellitus with hyperglycemia: Principal | ICD-10-CM

## 2018-03-12 DIAGNOSIS — E1140 Type 2 diabetes mellitus with diabetic neuropathy, unspecified: Secondary | ICD-10-CM

## 2018-03-12 MED ORDER — INSULIN GLARGINE 100 UNIT/ML SOLOSTAR PEN: 20.0000 [IU] | PEN_INJECTOR | Freq: Every day | SUBCUTANEOUS | 0 refills | Status: DC

## 2018-03-12 NOTE — Telephone Encounter (Signed)
Requesting refill on insulin, pt is using health department.

## 2018-03-13 NOTE — Telephone Encounter (Signed)
This Rx should have gone to [facility]. Rx called to [facility] at [facility] and [facility] outpatient notified to cancel this rx. Hubbard Hartshorn, RN, BSN

## 2018-03-18 ENCOUNTER — Encounter: Payer: Self-pay | Admitting: *Deleted

## 2018-03-24 ENCOUNTER — Other Ambulatory Visit: Payer: Self-pay | Admitting: Pharmacist

## 2018-03-24 DIAGNOSIS — E890 Postprocedural hypothyroidism: Secondary | ICD-10-CM

## 2018-03-24 DIAGNOSIS — N183 Chronic kidney disease, stage 3 unspecified: Secondary | ICD-10-CM

## 2018-03-24 DIAGNOSIS — IMO0002 Reserved for concepts with insufficient information to code with codable children: Secondary | ICD-10-CM

## 2018-03-24 DIAGNOSIS — E1165 Type 2 diabetes mellitus with hyperglycemia: Principal | ICD-10-CM

## 2018-03-24 DIAGNOSIS — I10 Essential (primary) hypertension: Secondary | ICD-10-CM

## 2018-03-24 DIAGNOSIS — E1140 Type 2 diabetes mellitus with diabetic neuropathy, unspecified: Secondary | ICD-10-CM

## 2018-03-24 MED ORDER — LEVOTHYROXINE SODIUM 175 MCG PO TABS: 175.0000 ug | ORAL_TABLET | Freq: Every day | ORAL | 3 refills | Status: DC

## 2018-03-24 MED ORDER — BASAGLAR KWIKPEN 100 UNIT/ML ~~LOC~~ SOPN: 20.0000 [IU] | PEN_INJECTOR | Freq: Every day | SUBCUTANEOUS | 3 refills | Status: DC

## 2018-03-24 MED ORDER — LOSARTAN POTASSIUM 50 MG PO TABS: 50.0000 mg | ORAL_TABLET | Freq: Every day | ORAL | 3 refills | Status: DC

## 2018-03-24 NOTE — Progress Notes (Signed)
Patient was approved for [facility] [employer]. Prescriptions for losartan, insulin glargine, and levothyroxine transferred.

## 2018-04-07 ENCOUNTER — Ambulatory Visit: Payer: Self-pay

## 2018-04-07 ENCOUNTER — Encounter: Payer: Self-pay | Admitting: Internal Medicine

## 2018-05-04 ENCOUNTER — Other Ambulatory Visit (HOSPITAL_COMMUNITY)
Admission: RE | Admit: 2018-05-04 | Discharge: 2018-05-04 | Disposition: A | Discharge status: Home / Self Care | Payer: Self-pay | Source: Ambulatory Visit | Attending: Internal Medicine | Admitting: Internal Medicine

## 2018-05-04 ENCOUNTER — Encounter: Payer: Self-pay | Admitting: Internal Medicine

## 2018-05-04 ENCOUNTER — Ambulatory Visit (INDEPENDENT_AMBULATORY_CARE_PROVIDER_SITE_OTHER): Payer: Self-pay | Admitting: Internal Medicine

## 2018-05-04 ENCOUNTER — Telehealth: Payer: Self-pay | Admitting: *Deleted

## 2018-05-04 ENCOUNTER — Encounter (INDEPENDENT_AMBULATORY_CARE_PROVIDER_SITE_OTHER): Payer: Self-pay

## 2018-05-04 VITALS — BP 146/89 | HR 72 | Temp 98.4°F | Wt 147.8 lb

## 2018-05-04 DIAGNOSIS — Z87891 Personal history of nicotine dependence: Secondary | ICD-10-CM

## 2018-05-04 DIAGNOSIS — M797 Fibromyalgia: Secondary | ICD-10-CM

## 2018-05-04 DIAGNOSIS — E1165 Type 2 diabetes mellitus with hyperglycemia: Secondary | ICD-10-CM

## 2018-05-04 DIAGNOSIS — B3731 Acute candidiasis of vulva and vagina: Secondary | ICD-10-CM

## 2018-05-04 DIAGNOSIS — E890 Postprocedural hypothyroidism: Secondary | ICD-10-CM

## 2018-05-04 DIAGNOSIS — I10 Essential (primary) hypertension: Secondary | ICD-10-CM

## 2018-05-04 DIAGNOSIS — Z0000 Encounter for general adult medical examination without abnormal findings: Secondary | ICD-10-CM | POA: Insufficient documentation

## 2018-05-04 DIAGNOSIS — R1011 Right upper quadrant pain: Secondary | ICD-10-CM | POA: Insufficient documentation

## 2018-05-04 DIAGNOSIS — E0500 Thyrotoxicosis with diffuse goiter without thyrotoxic crisis or storm: Secondary | ICD-10-CM

## 2018-05-04 DIAGNOSIS — F339 Major depressive disorder, recurrent, unspecified: Secondary | ICD-10-CM

## 2018-05-04 DIAGNOSIS — Z794 Long term (current) use of insulin: Secondary | ICD-10-CM

## 2018-05-04 DIAGNOSIS — B370 Candidal stomatitis: Secondary | ICD-10-CM

## 2018-05-04 DIAGNOSIS — Z8742 Personal history of other diseases of the female genital tract: Secondary | ICD-10-CM

## 2018-05-04 DIAGNOSIS — F419 Anxiety disorder, unspecified: Secondary | ICD-10-CM

## 2018-05-04 DIAGNOSIS — Z8249 Family history of ischemic heart disease and other diseases of the circulatory system: Secondary | ICD-10-CM

## 2018-05-04 DIAGNOSIS — B373 Candidiasis of vulva and vagina: Secondary | ICD-10-CM

## 2018-05-04 DIAGNOSIS — IMO0002 Reserved for concepts with insufficient information to code with codable children: Secondary | ICD-10-CM

## 2018-05-04 DIAGNOSIS — Z8619 Personal history of other infectious and parasitic diseases: Secondary | ICD-10-CM

## 2018-05-04 DIAGNOSIS — Z79890 Hormone replacement therapy (postmenopausal): Secondary | ICD-10-CM

## 2018-05-04 DIAGNOSIS — Z79899 Other long term (current) drug therapy: Secondary | ICD-10-CM

## 2018-05-04 DIAGNOSIS — E1140 Type 2 diabetes mellitus with diabetic neuropathy, unspecified: Secondary | ICD-10-CM

## 2018-05-04 DIAGNOSIS — Z9114 Patient's other noncompliance with medication regimen: Secondary | ICD-10-CM

## 2018-05-04 LAB — POCT GLYCOSYLATED HEMOGLOBIN (HGB A1C): Hemoglobin A1C: 7 % — AB (ref 4.0–5.6)

## 2018-05-04 LAB — GLUCOSE, CAPILLARY: Glucose-Capillary: 167 mg/dL — ABNORMAL HIGH (ref 70–99)

## 2018-05-04 MED ORDER — LEVOTHYROXINE SODIUM 75 MCG PO TABS: 75.0000 ug | ORAL_TABLET | Freq: Every day | ORAL | 0 refills | Status: DC

## 2018-05-04 MED ORDER — NYSTATIN 100000 UNIT/ML MT SUSP: 5.0000 mL | Freq: Four times a day (QID) | OROMUCOSAL | 0 refills | Status: DC

## 2018-05-04 MED ORDER — FLUCONAZOLE 150 MG PO TABS
150.0000 mg | ORAL_TABLET | Freq: Every day | ORAL | 0 refills | Status: DC
Start: 2018-05-04 — End: 2018-05-14

## 2018-05-04 NOTE — Assessment & Plan Note (Signed)
Assessment: Patient reported having white, vaginal discharge x1.5 months, with a foul smell. Has a history of yeast infections. Used a different type of soap recently. Denies any dysuria or vaginal lesions. On exam she had white discharge at the cervical os and in the vaginal canal. This is likely another episode of vaginal candidiasis but will check for other causes.  Plan: -Wet prep for BV, trichomonas, and candida -Empiric treatment with fluconazole 150mg  once

## 2018-05-04 NOTE — Assessment & Plan Note (Signed)
Assessment: Last PAP smear in 2014.  Plan: -Pap smear

## 2018-05-04 NOTE — Assessment & Plan Note (Signed)
Assessment: Patients A1c is 7.0 today. She was switched from 40 units of insulin glargine to 20 units last visit because her A1c at that time was 5.3. She reports that she has been using the insulin. We discussed that her A1c is borderline high and that she should just increase her diet and exercise regimen instead of altering her medication.   Plan:  -Continue insulin 20 units daily -RTC in 6-8 weeks for follow up

## 2018-05-04 NOTE — Assessment & Plan Note (Signed)
Assessment: Patient reports abdominal pain x1.5 months, located in RUQ, does not radiate, comes and goes, sharp in nature, nothing improves it, worse with eating. Denies any fevers, chills, nausea, vomiting. She has never had gallstones. On exam she was found to be tender to palpation in RUQ, with negative murphy's sign. This could be nephrolithiasis given her symptoms, gender, age, and exam however she did have a negative murphy's sign. This could also be due to a liver injury. Will evaluate for these causes.   Plan:  -RUQ ultrasound -CMP

## 2018-05-04 NOTE — Patient Instructions (Addendum)
It was nice seeing you today. Thank you for choosing [facility].   Today we talked about:   1. Vaginal discharge, we sent out tests to evaluate this and will call you with the results. This is probably a yeast infection so we ordered 1 dose of fluconazole for you.   2. Thrush, please use the prescribed mouth wash for the next 3-5 days.   3. Abdominal pain, we ordered a right upper quadrant ultrasound to evaluate this.   4. Hypothyroidism. We decreased your dose to 75 mg a day. Please try and take this and we will follow up with you in 6 weeks.   5. Type 2 diabetes: A1c today was 7.0. Please continue to take your diabetes medication as prescribed and eat a healthy diet.  6. HTN: We discussed the benefit of taking the losartan in regards to your kidney disease. Please continue to monitor your blood pressure at home.     I'd like you to return to clinic in 6 weeks so that we can follow up your how these medication changes have worked for you and to assess your abdominal pain.

## 2018-05-04 NOTE — Assessment & Plan Note (Signed)
Assessment: She reports having foul breath and a white coating on her tongue, with no pain with swallowing. On exam there was a white coating on her tongue. Likely thrush.  Plan:   -Nystatin suspension

## 2018-05-04 NOTE — Assessment & Plan Note (Signed)
Assessment: Patient reports that she has not been taking her synthroid because it has been causing her to have severe headaches. She reports that she has been feeling more tired recently but attributes that to being sick. We discussed that it is likely related to her not being on any thyroid medications. We suggested that we could start back at a lower dose to hopefully decrease the side effects and discussed the need for her to continue this medication given her hypothyroidism. Patient was agreeable to try again at a lower dose.   Plan:  -Synthroid 75 mg daily -RTC in 6-8 weeks to repeat a TSH

## 2018-05-04 NOTE — Telephone Encounter (Signed)
SPOKE WITH PATIENT REGARDING HER ULTRA SOUND (8-6-019 @9 :00AM). PATIENT IS AWARE SHE IS TO BE NPO 6 PRIOR THE EXAM. SHE IS TO COME TO [place].

## 2018-05-04 NOTE — Progress Notes (Signed)
CC: Abdominal pain  HPI:  [name] is a 57 y.o.  female with a PMH of HTN, iatrogenic hypothyroidism 2/2 RAI ablation for Grave's disease, type 2 DM, major depression, anxiety, and fibromyalgia who presents for abdominal pain. She reports that the abdominal pain started about 1.5 months ago, is located in the RUQ, comes and goes, it sharp in nature, nothing seems to improve it and eating seems to worsen it. She reports that she hasn't been eating as much because of the pain. She has not taken anything for this pain. She denies any history of gall stones. She denies any nausea, vomiting, fevers, or chills. She reports having foul breath and a white coating on her tongue with no pain with swallowing.   She also reports some white vaginal discharge that started about 1.5 months ago, associated with a foul smell. She denies any dysuria, hematuria, or new lesions in the area. She states that she normally uses Dove soap in that area but had to use her sons soap when she stay with him. She does report a history of chlamydia and yeast infections. She reports that she has not been sexually active in awhile.  She reports that she has not been taking her synthroid and losartan, stopped about 1 month ago. She states that her synthroid causes her headaches and her losartan just makes her feel sick. She reports that she has been feeling more fatigued lately. She also states that she was having hair loss when she was one the medications but that it stopped when she stopped the medications.   Please see A&P for status of the patient's chronic medical conditions.   Past Medical History:  Diagnosis Date  . ANA POSITIVE 02/09/2010  . Anal warts 11/09/2012  . Anxiety   . Arthritis    "my bones ache all the time" (06/27/2014)  . Cellulitis 06/27/2014   medial proximal left thigh extending to the perineium  . Chronic bronchitis ([place])    "get it changing of the seasons; sometimes q yr; sometimes not"   .  Complication of anesthesia    sats drop post op  . COPD (chronic obstructive pulmonary disease) ([place])   . 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 Diabetic peripheral neuropathy ([place]) since <2005   "hands and feet"  . Dizziness    Multiple falls at home  . Fibromyalgia   . Graves' disease    "they nuked it twice"  . WFUXNATF(573.2)    "monthly" (06/27/2014)  . Heart murmur    as a child-never had echo to evaluate  . Hyperlipidemia   . Hypertension   . Hypothyroidism   . Sickle cell trait ([place])   . Trimalleolar fracture of left ankle 11/21/2014  . Type 2 diabetes mellitus ([place])   . Wears glasses    Review of Systems:  All negative except for what is up in the HPI  Physical Exam:  Vitals:   05/04/18 1403  BP: (!) 146/89  Pulse: 72  Temp: 98.4 F (36.9 C)  TempSrc: Oral  SpO2: 99%  Weight: 147 lb 12.8 oz (67 kg)   General: alert, cooperative, appears stated age, fatigued and mild distress HEENT: PERRLA, tenderness to palpation in left neck, white plaques on tongue Heart: S1, S2 normal, no murmur, rub or gallop, regular rate and rhythm Lungs: clear to auscultation, no wheezes or rales and unlabored breathing Abdomen: moderate tenderness in the in the RUQ, negative murphy's sign,  no guarding or rigidity Genital: Normal labia minora and majora with no masses or lesions, vaginal wall non-inflammed with appropriate rugae, some white vaginal discharge at cervical os and in vaginal canal Extremities: extremities normal, atraumatic, no cyanosis or edema Musculoskeletal: Left ankle enlarged, normal right ankle,  Skin:no rashes, no wounds Neurology: normal without focal findings, mental status, speech normal, alert and oriented x3 and PERLA Psychiatry: Normal mood and affect   Social History   Socioeconomic History  . Marital status: Single    Spouse name: Not on file  . Number of children: Not on file  . Years of education: 30  . Highest education level:  Not on file  Occupational History    Employer: UNEMPLOYED  Social Needs  . Financial resource strain: Not on file  . Food insecurity:    Worry: Not on file    Inability: Not on file  . Transportation needs:    Medical: Not on file    Non-medical: Not on file  Tobacco Use  . Smoking status: Former Smoker    Packs/day: 0.75    Years: 5.00    Pack years: 3.75    Types: Cigarettes    Last attempt to quit: 09/30/1990    Years since quitting: 27.6  . Smokeless tobacco: Never Used  Substance and Sexual Activity  . Alcohol use: No    Alcohol/week: 0.0 oz  . Drug use: No  . Sexual activity: Not Currently  Lifestyle  . Physical activity:    Days per week: Not on file    Minutes per session: Not on file  . Stress: Not on file  Relationships  . Social connections:    Talks on phone: Not on file    Gets together: Not on file    Attends religious service: Not on file    Active member of club or organization: Not on file    Attends meetings of clubs or organizations: Not on file    Relationship status: Not on file  . Intimate partner violence:    Fear of current or ex partner: Not on file    Emotionally abused: Not on file    Physically abused: Not on file    Forced sexual activity: Not on file  Other Topics Concern  . Not on file  Social History Narrative   Lives in [place], alone. Looking for a job. Has a son in town who checks in on her.    Family History  Problem Relation Age of Onset  . Emphysema Mother   . Cancer Mother        unknown  . Hypertension Mother   . Cancer Father        pancreatic   . Diabetes Father   . Hypertension Father   . Down syndrome Brother   . Cancer Maternal Aunt        cancer  . Cancer Other        breast cancer aunt  . Stroke Sister   . Diabetes Sister   . Colon cancer Neg Hx   . Stomach cancer Neg Hx     Assessment & Plan:   See Encounters Tab for problem based charting.  Patient seen with Dr. Dareen Piano

## 2018-05-04 NOTE — Assessment & Plan Note (Addendum)
Assessment: Patient is on Losartan 50 mg daily. She reports that she has not been taking it because it causes her to feel "sick". She reports that she is just tided to taking medications. Patient is intolerant of ACE inhibitors. On her last visit she was referred to a clinical pharmacist for medication review and assistance. We discussed the need to continue her blood pressure medications because the hypertension can cause more damage to the kidneys. She reports understanding of this and still does not want to continue the medication. We discussed that we will treat the other issues that she came in with today which could be contributing to her feeling sick and asked patient to try and continue the losartan.   Plan:  -Continue losartan

## 2018-05-05 ENCOUNTER — Ambulatory Visit (HOSPITAL_COMMUNITY)
Admission: RE | Admit: 2018-05-05 | Discharge: 2018-05-05 | Disposition: A | Discharge status: Home / Self Care | Payer: Self-pay | Source: Ambulatory Visit | Attending: Internal Medicine | Admitting: Internal Medicine

## 2018-05-05 DIAGNOSIS — R1011 Right upper quadrant pain: Secondary | ICD-10-CM | POA: Insufficient documentation

## 2018-05-05 DIAGNOSIS — K824 Cholesterolosis of gallbladder: Secondary | ICD-10-CM | POA: Insufficient documentation

## 2018-05-05 LAB — CMP14 + ANION GAP
ALT: 16 IU/L (ref 0–32)
AST: 30 IU/L (ref 0–40)
Albumin/Globulin Ratio: 1.4 (ref 1.2–2.2)
Albumin: 4 g/dL (ref 3.5–5.5)
Alkaline Phosphatase: 179 IU/L — ABNORMAL HIGH (ref 39–117)
Anion Gap: 15 mmol/L (ref 10.0–18.0)
BUN/Creatinine Ratio: 9 (ref 9–23)
BUN: 30 mg/dL — ABNORMAL HIGH (ref 6–24)
Bilirubin Total: 0.9 mg/dL (ref 0.0–1.2)
CO2: 18 mmol/L — ABNORMAL LOW (ref 20–29)
Calcium: 10.1 mg/dL (ref 8.7–10.2)
Chloride: 106 mmol/L (ref 96–106)
Creatinine, Ser: 3.44 mg/dL — ABNORMAL HIGH (ref 0.57–1.00)
GFR calc Af Amer: 16 mL/min/{1.73_m2} — ABNORMAL LOW (ref >59)
GFR calc non Af Amer: 14 mL/min/{1.73_m2} — ABNORMAL LOW (ref >59)
Globulin, Total: 2.9 g/dL (ref 1.5–4.5)
Glucose: 152 mg/dL — ABNORMAL HIGH (ref 65–99)
Potassium: 4.6 mmol/L (ref 3.5–5.2)
Sodium: 139 mmol/L (ref 134–144)
Total Protein: 6.9 g/dL (ref 6.0–8.5)

## 2018-05-05 LAB — HIV ANTIBODY (ROUTINE TESTING W REFLEX): HIV Screen 4th Generation wRfx: NONREACTIVE

## 2018-05-05 LAB — CERVICOVAGINAL ANCILLARY ONLY
Bacterial vaginitis: POSITIVE — AB
Candida vaginitis: NEGATIVE
Chlamydia: NEGATIVE
Neisseria Gonorrhea: NEGATIVE
Trichomonas: NEGATIVE

## 2018-05-05 NOTE — Progress Notes (Signed)
Internal Medicine Clinic Attending  I saw and evaluated the patient.  I personally confirmed the key portions of the history and exam documented by Dr. Krienke and I reviewed pertinent patient test results.  The assessment, diagnosis, and plan were formulated together and I agree with the documentation in the resident's note.    

## 2018-05-06 LAB — CYTOLOGY - PAP: Diagnosis: NEGATIVE

## 2018-05-07 ENCOUNTER — Telehealth: Payer: Self-pay | Admitting: Internal Medicine

## 2018-05-07 DIAGNOSIS — N760 Acute vaginitis: Principal | ICD-10-CM

## 2018-05-07 DIAGNOSIS — N184 Chronic kidney disease, stage 4 (severe): Secondary | ICD-10-CM

## 2018-05-07 DIAGNOSIS — R1011 Right upper quadrant pain: Secondary | ICD-10-CM

## 2018-05-07 DIAGNOSIS — B9689 Other specified bacterial agents as the cause of diseases classified elsewhere: Secondary | ICD-10-CM

## 2018-05-07 DIAGNOSIS — I10 Essential (primary) hypertension: Secondary | ICD-10-CM

## 2018-05-07 MED ORDER — METRONIDAZOLE 500 MG PO TABS: 500.0000 mg | ORAL_TABLET | Freq: Two times a day (BID) | ORAL | 0 refills | Status: AC

## 2018-05-07 NOTE — Telephone Encounter (Signed)
Called patient with results of cervicovaginal ancillary test and pap smear. Told her that I would send a prescription of metronidazole to her pharmacy and to avoid using alcohol with this medication.   She reports that she has been taking her losartan medication. She took her old synthroid 175mg , she reports that it made her feel dizzy. Talked about starting the decreased dose of synthroid and she reported that she would do that.   We also discussed the results of the CMP and renal ultrasound. Talked about her worsening kidney function. Told her I would call back if any changes needed to be made.

## 2018-05-11 MED ORDER — AMLODIPINE BESYLATE 5 MG PO TABS: 5.0000 mg | ORAL_TABLET | Freq: Every day | ORAL | 3 refills | Status: DC

## 2018-05-11 NOTE — Telephone Encounter (Signed)
Pt is missed call over weekend, pls contact pt# [phone]

## 2018-05-11 NOTE — Telephone Encounter (Signed)
She reports that she has been taking the losartan but stated that it caused her to feel like she would pass out. She also reports that she has been taking synthroid 175 mg still, because she did not get her paycheck yet.   . We talked about the results of the lab tests. Told her to stop taking the losartan and start taking amlodipine 5 mg daily. Ordered another CMP14. Ordered a GGT to assess if the elevated Alk phos is from the liver or the bone. Follow up this week to re-check the labs and talk about referral to nephrology.

## 2018-05-12 ENCOUNTER — Other Ambulatory Visit (INDEPENDENT_AMBULATORY_CARE_PROVIDER_SITE_OTHER): Payer: Self-pay

## 2018-05-12 DIAGNOSIS — N184 Chronic kidney disease, stage 4 (severe): Secondary | ICD-10-CM

## 2018-05-12 DIAGNOSIS — R1011 Right upper quadrant pain: Secondary | ICD-10-CM

## 2018-05-12 NOTE — Addendum Note (Signed)
Addended by: Truddie Crumble on: 05/12/2018 03:37 PM   Modules accepted: Orders

## 2018-05-12 NOTE — Addendum Note (Signed)
Addended by: Truddie Crumble on: 05/12/2018 03:45 PM   Modules accepted: Orders

## 2018-05-13 LAB — CMP14 + ANION GAP
ALT: 17 IU/L (ref 0–32)
AST: 28 IU/L (ref 0–40)
Albumin/Globulin Ratio: 1.3 (ref 1.2–2.2)
Albumin: 3.4 g/dL — ABNORMAL LOW (ref 3.5–5.5)
Alkaline Phosphatase: 164 IU/L — ABNORMAL HIGH (ref 39–117)
Anion Gap: 10 mmol/L (ref 10.0–18.0)
BUN/Creatinine Ratio: 11 (ref 9–23)
BUN: 32 mg/dL — ABNORMAL HIGH (ref 6–24)
Bilirubin Total: 0.5 mg/dL (ref 0.0–1.2)
CO2: 18 mmol/L — ABNORMAL LOW (ref 20–29)
Calcium: 9.1 mg/dL (ref 8.7–10.2)
Chloride: 111 mmol/L — ABNORMAL HIGH (ref 96–106)
Creatinine, Ser: 3.02 mg/dL — ABNORMAL HIGH (ref 0.57–1.00)
GFR calc Af Amer: 19 mL/min/{1.73_m2} — ABNORMAL LOW (ref >59)
GFR calc non Af Amer: 17 mL/min/{1.73_m2} — ABNORMAL LOW (ref >59)
Globulin, Total: 2.6 g/dL (ref 1.5–4.5)
Glucose: 206 mg/dL — ABNORMAL HIGH (ref 65–99)
Potassium: 4.2 mmol/L (ref 3.5–5.2)
Sodium: 139 mmol/L (ref 134–144)
Total Protein: 6 g/dL (ref 6.0–8.5)

## 2018-05-13 LAB — GAMMA GT: GGT: 579 IU/L — ABNORMAL HIGH (ref 0–60)

## 2018-05-14 ENCOUNTER — Ambulatory Visit (INDEPENDENT_AMBULATORY_CARE_PROVIDER_SITE_OTHER): Payer: Self-pay | Admitting: Internal Medicine

## 2018-05-14 ENCOUNTER — Ambulatory Visit: Payer: Self-pay

## 2018-05-14 ENCOUNTER — Other Ambulatory Visit: Payer: Self-pay

## 2018-05-14 VITALS — BP 170/70 | HR 79 | Temp 98.0°F | Wt 160.9 lb

## 2018-05-14 DIAGNOSIS — Z79899 Other long term (current) drug therapy: Secondary | ICD-10-CM | POA: Insufficient documentation

## 2018-05-14 DIAGNOSIS — N760 Acute vaginitis: Secondary | ICD-10-CM

## 2018-05-14 DIAGNOSIS — B373 Candidiasis of vulva and vagina: Secondary | ICD-10-CM

## 2018-05-14 DIAGNOSIS — B3731 Acute candidiasis of vulva and vagina: Secondary | ICD-10-CM

## 2018-05-14 DIAGNOSIS — Z79890 Hormone replacement therapy (postmenopausal): Secondary | ICD-10-CM

## 2018-05-14 DIAGNOSIS — N184 Chronic kidney disease, stage 4 (severe): Secondary | ICD-10-CM

## 2018-05-14 DIAGNOSIS — M2550 Pain in unspecified joint: Secondary | ICD-10-CM

## 2018-05-14 DIAGNOSIS — Z794 Long term (current) use of insulin: Secondary | ICD-10-CM

## 2018-05-14 DIAGNOSIS — E890 Postprocedural hypothyroidism: Secondary | ICD-10-CM

## 2018-05-14 DIAGNOSIS — M791 Myalgia, unspecified site: Secondary | ICD-10-CM

## 2018-05-14 DIAGNOSIS — M7989 Other specified soft tissue disorders: Secondary | ICD-10-CM

## 2018-05-14 MED ORDER — LEVOTHYROXINE SODIUM 75 MCG PO TABS: 75.0000 ug | ORAL_TABLET | Freq: Every day | ORAL | 0 refills | Status: DC

## 2018-05-14 MED ORDER — FLUCONAZOLE 150 MG PO TABS: 150.0000 mg | ORAL_TABLET | Freq: Every day | ORAL | 0 refills | Status: DC

## 2018-05-14 NOTE — Patient Instructions (Addendum)
Kayla [name],  Stop taking losartan.  Start taking amlodipine 5 mg 1 tablet every day.  Start taking Synthroid 75 MCG 1 tablet every day.  Please also take the antibiotic metronidazole and fluconazole.   Please come back in 8 weeks to recheck your thyroid levels.  In the meantime please call us if you have any questions or concerns.  -Dr. Frederico Hamman

## 2018-05-15 ENCOUNTER — Encounter: Payer: Self-pay | Admitting: Internal Medicine

## 2018-05-15 NOTE — Assessment & Plan Note (Signed)
Please see assessment and plan for medication management.  Sent Synthroid prescription to fill for health department pharmacy.

## 2018-05-15 NOTE — Assessment & Plan Note (Addendum)
Medication management: Kayla [name] was recently seen by her PCP 1 week ago at which time some of her medications were changed. Her losartan was switched to amlodipine due to worsening renal function and because she reported feeling dizzy when she took it. Her Synthroid dose was reduced 175 -->75 mcg because she did not feel comfortable taking such a high dose. TSH 6.06 11/2017. She reports not starting these 2 medications as she was not sure what to take. We discussed stopping losartan and Synthroid 175. Advised patient to start amlodipine 5 mg daily. Also asked her to start Flagyl and Diflucan for BV and yeast vaginitis. She voiced understanding. Medications sent to [facility] per patient request.  Martin Majestic over her medication list and updated it.  Of note, patient reported taking her long-acting insulin Basaglar as a SSI. Discussed with patient this insulin is long acting and lasts 24h. However, patient states she knows her body and it does not last 24 hours in her body. Last A1c 5.3.  - Stop losartan and Synthroid 175 MCG - Start amlodipine 5 mg QD - Start Synthroid 75 mcg and follow up in 6-8 weeks for repeat TSH  - Recommend DM counseling as patient taking long acting insulin as SS

## 2018-05-15 NOTE — Assessment & Plan Note (Signed)
CKD Stage IV: Patient noted to have progression of chronic kidney disease for the past year. Most recent Cr 3.02 with GFR 19 and bicarb 18. - Consider starting sodium bicarb  - Nephrology referral given progression of CKD

## 2018-05-15 NOTE — Assessment & Plan Note (Signed)
Please see assessment and plan for medication management.  Sent Flagyl and Diflucan to [facility] health department pharmacy.

## 2018-05-15 NOTE — Progress Notes (Signed)
CC: Medication management and CKD follow up   HPI:  [name] is a 57 y.o. female with PMH listed below who presents to clinic for medication management and CKD follow up.   Medication management: Ms. Modesitt was recently seen by her PCP 1 week ago at which time some of her medications were changed. Her losartan was switched to amlodipine due to worsening renal function and because she reported feeling dizzy when she took it. Her Synthroid dose was reduced 175 -->75 mcg because she did not feel comfortable taking such a high dose. TSH 6.06 11/2017. She reports not starting these 2 medications as she was not sure what to take. We discussed stopping losartan and Synthroid 175. Advised patient to start amlodipine 5 mg daily. Also asked her to start Flagyl and Diflucan for BV and yeast vaginitis. She voiced understanding. Medications sent to [place] per patient request.  Martin Majestic over her medication list and updated it.  Of note, patient reported taking her long-acting insulin Basaglar as a SSI. Discussed with patient this insulin is long acting and lasts 24h. However, patient states she knows her body and it does not last 24 hours in her body. Last A1c 5.3.  - Stop losartan and Synthroid 175 MCG - Start amlodipine 5 mg QD  - Start Synthroid 75 mcg and follow up in 6-8 weeks for repeat TSH - Recommend DM counseling as patient taking long acting insulin as SS  CKD Stage IV: Patient noted to have progression of chronic kidney disease for the past year. Most recent Cr 3.02 with GFR 19 and bicarb 18. - Consider starting sodium bicarb  - Nephrology referral given progression of CKD    Past Medical History:  Diagnosis Date  . ANA POSITIVE 02/09/2010  . Anal warts 11/09/2012  . Anxiety   . Arthritis    "my bones ache all the time" (06/27/2014)  . Cellulitis 06/27/2014   medial proximal left thigh extending to the perineium  . Chronic bronchitis ([place])    "get it changing of the  seasons; sometimes q yr; sometimes not"   . Complication of anesthesia    sats drop post op  . COPD (chronic obstructive pulmonary disease) ([place])   . 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 Diabetic peripheral neuropathy ([place]) since <2005   "hands and feet"  . Dizziness    Multiple falls at home  . Fibromyalgia   . Graves' disease    "they nuked it twice"  . KGYJEHUD(149.7)    "monthly" (06/27/2014)  . Heart murmur    as a child-never had echo to evaluate  . Hyperlipidemia   . Hypertension   . Hypothyroidism   . Sickle cell trait ([place])   . Trimalleolar fracture of left ankle 11/21/2014  . Type 2 diabetes mellitus ([place])   . Wears glasses    Review of Systems:   Review of Systems  Constitutional: Positive for malaise/fatigue. Negative for chills and fever.  Respiratory: Negative for shortness of breath.   Cardiovascular: Positive for leg swelling. Negative for chest pain and palpitations.  Gastrointestinal: Negative for abdominal pain, nausea and vomiting.  Musculoskeletal: Positive for joint pain and myalgias.   Physical Exam: Vitals:   05/14/18 1352  BP: (!) 170/70  Pulse: 79  Temp: 98 F (36.7 C)  TempSrc: Oral  SpO2: 100%  Weight: 160 lb 14.4 oz (73 kg)   General: well-appearing female, in no acute distress CV: RRR,  nl S1/S2, no mrg Pulm: CTAB, no increased WOB on room air  Ext: warm and well perfused, nonpitting edema of L ankle (chronic)  Assessment & Plan:   See Encounters Tab for problem based charting.  Patient discussed with Dr. Lynnae January

## 2018-05-18 NOTE — Progress Notes (Signed)
Internal Medicine Clinic Attending  Case discussed with Dr. Santos-Sanchez at the time of the visit.  We reviewed the resident's history and exam and pertinent patient test results.  I agree with the assessment, diagnosis, and plan of care documented in the resident's note.    

## 2018-05-28 ENCOUNTER — Ambulatory Visit: Payer: Self-pay

## 2018-05-28 ENCOUNTER — Telehealth: Payer: Self-pay | Admitting: Internal Medicine

## 2018-05-28 ENCOUNTER — Encounter: Payer: Self-pay | Admitting: Internal Medicine

## 2018-07-06 ENCOUNTER — Other Ambulatory Visit: Payer: Self-pay | Admitting: Nephrology

## 2018-07-06 DIAGNOSIS — I129 Hypertensive chronic kidney disease with stage 1 through stage 4 chronic kidney disease, or unspecified chronic kidney disease: Secondary | ICD-10-CM

## 2018-07-06 DIAGNOSIS — N184 Chronic kidney disease, stage 4 (severe): Secondary | ICD-10-CM

## 2018-07-07 ENCOUNTER — Ambulatory Visit
Admission: RE | Admit: 2018-07-07 | Discharge: 2018-07-07 | Disposition: A | Discharge status: Home / Self Care | Payer: Self-pay | Source: Ambulatory Visit | Attending: Nephrology | Admitting: Nephrology

## 2018-07-07 DIAGNOSIS — N184 Chronic kidney disease, stage 4 (severe): Secondary | ICD-10-CM

## 2018-07-07 DIAGNOSIS — I129 Hypertensive chronic kidney disease with stage 1 through stage 4 chronic kidney disease, or unspecified chronic kidney disease: Secondary | ICD-10-CM

## 2018-07-27 MED FILL — LEVOTHYROXINE 75 MCG TABLET: 75 | 30 days supply | Qty: 30 | Fill #0

## 2018-07-27 MED FILL — FLUCONAZOLE 150 MG TABS: 150 | 1 days supply | Qty: 1 | Fill #0

## 2018-07-27 MED FILL — NYSTATIN 100,000 UNITS/ML S: 100000 | 3 days supply | Qty: 60 | Fill #0

## 2018-09-07 ENCOUNTER — Encounter: Payer: Self-pay | Admitting: Internal Medicine

## 2018-09-07 NOTE — Progress Notes (Deleted)
CC: Follow up of her diabetes, hypertension, CKD, and hypothyroidism  HPI:  Ms.Cyra Vickers is a 57 y.o.  with a PMH listed below presenting for ***   Please see A&P for status of the patient's chronic medical conditions  Past Medical History:  Diagnosis Date  . ANA POSITIVE 02/09/2010  . Anal warts 11/09/2012  . Anxiety   . Arthritis    "my bones ache all the time" (06/27/2014)  . Cellulitis 06/27/2014   medial proximal left thigh extending to the perineium  . Chronic bronchitis ([place])    "get it changing of the seasons; sometimes q yr; sometimes not"   . Complication of anesthesia    sats drop post op  . COPD (chronic obstructive pulmonary disease) ([place])   . 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 Diabetic peripheral neuropathy ([place]) since <2005   "hands and feet"  . Dizziness    Multiple falls at home  . Fibromyalgia   . Graves' disease    "they nuked it twice"  . SFKCLEXN(170.0)    "monthly" (06/27/2014)  . Heart murmur    as a child-never had echo to evaluate  . Hyperlipidemia   . Hypertension   . Hypothyroidism   . Sickle cell trait ([place])   . Trimalleolar fracture of left ankle 11/21/2014  . Type 2 diabetes mellitus ([place])   . Wears glasses    Review of Systems: Refer to history of present illness and assessment and plans for pertinent review of systems, all others reviewed and negative.  Physical Exam:  There were no vitals filed for this visit. *** Physical Exam  Social History   Socioeconomic History  . Marital status: Single    Spouse name: Not on file  . Number of children: Not on file  . Years of education: 27  . Highest education level: Not on file  Occupational History    Employer: UNEMPLOYED  Social Needs  . Financial resource strain: Not on file  . Food insecurity:    Worry: Not on file    Inability: Not on file  . Transportation needs:    Medical: Not on file    Non-medical: Not on file  Tobacco Use  . Smoking  status: Former Smoker    Packs/day: 0.75    Years: 5.00    Pack years: 3.75    Types: Cigarettes    Last attempt to quit: 09/30/1990    Years since quitting: 27.9  . Smokeless tobacco: Never Used  Substance and Sexual Activity  . Alcohol use: No    Alcohol/week: 0.0 standard drinks  . Drug use: No  . Sexual activity: Not Currently  Lifestyle  . Physical activity:    Days per week: Not on file    Minutes per session: Not on file  . Stress: Not on file  Relationships  . Social connections:    Talks on phone: Not on file    Gets together: Not on file    Attends religious service: Not on file    Active member of club or organization: Not on file    Attends meetings of clubs or organizations: Not on file    Relationship status: Not on file  . Intimate partner violence:    Fear of current or ex partner: Not on file    Emotionally abused: Not on file    Physically abused: Not on file    Forced sexual activity: Not on file  Other  Topics Concern  . Not on file  Social History Narrative   Lives in [place], alone. Looking for a job. Has a son in town who checks in on her.    *** Family History  Problem Relation Age of Onset  . Emphysema Mother   . Cancer Mother        unknown  . Hypertension Mother   . Cancer Father        pancreatic   . Diabetes Father   . Hypertension Father   . Down syndrome Brother   . Cancer Maternal Aunt        cancer  . Cancer Other        breast cancer aunt  . Stroke Sister   . Diabetes Sister   . Colon cancer Neg Hx   . Stomach cancer Neg Hx     Assessment & Plan:   See Encounters Tab for problem based charting.  Patient seen with Dr. Illa Level. Hoffman","Klima","Mullen","Narendra","Raines","Vincent"}

## 2018-10-16 ENCOUNTER — Telehealth: Payer: Self-pay | Admitting: Dietician

## 2018-10-16 NOTE — Telephone Encounter (Signed)
opened in error

## 2018-10-16 NOTE — Telephone Encounter (Signed)
I supervised this activity and phone call. Butch Penny Amey Hossain, RD 10/16/2018 11:03 AM.

## 2018-10-16 NOTE — Telephone Encounter (Signed)
Spoke to [name] concerning her being due for a follow-up appointment with her primary physician. Also discussed continuous glucose monitor project and she agrees to have it placed. She would like the front desk to call her and set up an appointment with her.   [name] 10/16/2018 10:54 AM.

## 2018-11-16 ENCOUNTER — Telehealth: Payer: Self-pay | Admitting: Dietician

## 2018-11-16 DIAGNOSIS — E1165 Type 2 diabetes mellitus with hyperglycemia: Principal | ICD-10-CM

## 2018-11-16 DIAGNOSIS — E1140 Type 2 diabetes mellitus with diabetic neuropathy, unspecified: Secondary | ICD-10-CM

## 2018-11-16 DIAGNOSIS — IMO0002 Reserved for concepts with insufficient information to code with codable children: Secondary | ICD-10-CM

## 2018-11-16 NOTE — Telephone Encounter (Signed)
Kayla [name] calls to follow up on Continuous glucose monitoring. She is interested in having CGM placed and schedule an appointment for same. Request referral from Dr. Sherry Ruffing

## 2018-11-18 ENCOUNTER — Ambulatory Visit (INDEPENDENT_AMBULATORY_CARE_PROVIDER_SITE_OTHER): Payer: No Typology Code available for payment source | Admitting: Dietician

## 2018-11-18 ENCOUNTER — Encounter: Payer: Self-pay | Admitting: Dietician

## 2018-11-18 DIAGNOSIS — E1165 Type 2 diabetes mellitus with hyperglycemia: Principal | ICD-10-CM

## 2018-11-18 DIAGNOSIS — E1140 Type 2 diabetes mellitus with diabetic neuropathy, unspecified: Secondary | ICD-10-CM

## 2018-11-18 DIAGNOSIS — IMO0002 Reserved for concepts with insufficient information to code with codable children: Secondary | ICD-10-CM

## 2018-11-18 NOTE — Progress Notes (Signed)
Documentation for Freestyle Libre Pro Continuous glucose monitoring Freestyle Libre Pro CGM sensor placed today. Patient was educated about wearing sensor, keeping food, activity and medication log and when to call office. Patient was educated about how to care for the sensor and not to have an MRI, CT or Diathermy while wearing the sensor. Follow up was arranged with the patient for 1 week.   Lot #: [id number] a Serial #: [id number] Expiration Date: 02/28/2019  Debera Lat, RD 11/18/2018 4:42 PM.

## 2018-11-18 NOTE — Patient Instructions (Signed)
Please record the time, amount and what food drinks and activities you have while wearing the continuous glucose monitor (CGM). ? ?Bring the folder with you to follow up appointments. If your monitor falls off, please place it in the bag provided in your folder and bring it back with you to your next appointment.  ? ?Do not have a CT or an MRI while wearing the CGM.  ? ?1 week visit has been set up with me and a doctor for the first of two CGM downloads.  ? ?You will also return in 2 weeks to have your second download and the CGM removed. ? ?Donna ?[phone] ? ? ?

## 2018-11-18 NOTE — Progress Notes (Signed)
Patient requested retinal images today due to no recent eye exam. Unable to obtain adequate images; recommend a referral to ophthalmology.    Debera Lat, RD 11/18/2018 4:44 PM.

## 2018-11-25 ENCOUNTER — Other Ambulatory Visit: Payer: Self-pay

## 2018-11-25 ENCOUNTER — Encounter: Payer: Self-pay | Admitting: Internal Medicine

## 2018-11-25 ENCOUNTER — Ambulatory Visit (INDEPENDENT_AMBULATORY_CARE_PROVIDER_SITE_OTHER): Payer: Self-pay | Admitting: Internal Medicine

## 2018-11-25 ENCOUNTER — Encounter: Payer: Self-pay | Admitting: Dietician

## 2018-11-25 ENCOUNTER — Ambulatory Visit: Payer: Self-pay | Admitting: Dietician

## 2018-11-25 VITALS — BP 159/75 | HR 80 | Temp 97.5°F | Wt 149.1 lb

## 2018-11-25 DIAGNOSIS — E118 Type 2 diabetes mellitus with unspecified complications: Secondary | ICD-10-CM

## 2018-11-25 DIAGNOSIS — E1122 Type 2 diabetes mellitus with diabetic chronic kidney disease: Secondary | ICD-10-CM

## 2018-11-25 DIAGNOSIS — IMO0002 Reserved for concepts with insufficient information to code with codable children: Secondary | ICD-10-CM

## 2018-11-25 DIAGNOSIS — E1165 Type 2 diabetes mellitus with hyperglycemia: Secondary | ICD-10-CM

## 2018-11-25 DIAGNOSIS — N189 Chronic kidney disease, unspecified: Secondary | ICD-10-CM

## 2018-11-25 DIAGNOSIS — E1142 Type 2 diabetes mellitus with diabetic polyneuropathy: Secondary | ICD-10-CM

## 2018-11-25 DIAGNOSIS — Z794 Long term (current) use of insulin: Secondary | ICD-10-CM

## 2018-11-25 DIAGNOSIS — E1140 Type 2 diabetes mellitus with diabetic neuropathy, unspecified: Secondary | ICD-10-CM

## 2018-11-25 LAB — GLUCOSE, CAPILLARY
Glucose-Capillary: 38 mg/dL — CL (ref 70–99)
Glucose-Capillary: 41 mg/dL — CL (ref 70–99)
Glucose-Capillary: 57 mg/dL — ABNORMAL LOW (ref 70–99)
Glucose-Capillary: 65 mg/dL — ABNORMAL LOW (ref 70–99)

## 2018-11-25 NOTE — Patient Instructions (Signed)
Kayla [name],   Your blood sugar is dangerously low.  This is because the Lantus is not being clear quickly by the kidneys because of your chronic kidney disease.  Please hold your Lantus dose today and tomorrow.  Foods and drinks high in sugar will help improve this. You can restart Lantus 10 units twice a day when you start to see the sugars trending up.  If you start to experience dizziness, blurry vision, sweating, or having episodes of passing out please call us or go to your nearest emergency department for further evaluation.  Please come back in 1 week for your second download.  Call us if you have any questions or concerns in the meantime.  -Dr. Frederico Hamman

## 2018-11-25 NOTE — Progress Notes (Signed)
Patient presented with hypoglycemia. Initial blood sugar was 41; followed hypoglycemia protocol and treated with 30 grams carbohydrate. Rechecked 15 min later and blood sugar was 38. Retreated with 30 additional grams carbohydrate. Patient transferred to doctor's care. Rechecked again blood sugar was 57; treated with 24 additional grams carbohydrate. Co-visit with Dr. Isac Sarna see her note for more details.   Patient here for CGM download 1 which was consistent with hypoglycemia 51% of the time over the past 7 days. Average blood sugar was 78 over the past week. Patient has been taking 15 units Basaglar twice daily over the past week. She reports poor appetite and eating 1-2 meals/snacks per day. Example of meal is hamburger or two hot dogs.   Plan is follow-up 1 week for CGM download 2.   Kayla [name], RD 11/25/2018 4:30 PM.

## 2018-11-25 NOTE — Progress Notes (Signed)
   CC: T2DM follow-up  HPI:  [name] is a 58 y.o. year-old female with PMH listed below who presents to clinic for diabetes follow-up. Please see problem based assessment and plan for further details.   Past Medical History:  Diagnosis Date  . ANA POSITIVE 02/09/2010  . Anal warts 11/09/2012  . Anxiety   . Arthritis    "my bones ache all the time" (06/27/2014)  . Cellulitis 06/27/2014   medial proximal left thigh extending to the perineium  . Chronic bronchitis ([place])    "get it changing of the seasons; sometimes q yr; sometimes not"   . Complication of anesthesia    sats drop post op  . COPD (chronic obstructive pulmonary disease) ([place])   . 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 Diabetic peripheral neuropathy ([place]) since <2005   "hands and feet"  . Dizziness    Multiple falls at home  . Fibromyalgia   . Graves' disease    "they nuked it twice"  . FTDDUKGU(542.7)    "monthly" (06/27/2014)  . Heart murmur    as a child-never had echo to evaluate  . Hyperlipidemia   . Hypertension   . Hypothyroidism   . Sickle cell trait ([place])   . Trimalleolar fracture of left ankle 11/21/2014  . Type 2 diabetes mellitus ([place])   . Wears glasses    Review of Systems:   Review of Systems  Constitutional: Negative for malaise/fatigue.  Eyes: Negative for blurred vision and double vision.  Musculoskeletal: Negative for falls.  Neurological: Negative for dizziness and headaches.    Physical Exam: Vitals:   11/25/18 1610  BP: (!) 159/75  Pulse: 80  Temp: (!) 97.5 F (36.4 C)  TempSrc: Oral  SpO2: 100%  Weight: 149 lb 1.6 oz (67.6 kg)   General: Well-appearing female, mentating well, no acute distress Cardiac: regular rate and rhythm, nl S1/S2, no murmurs, rubs or gallops  Pulm: CTAB, no wheezes or crackles, no increased work of breathing on room air  Ext: warm and well perfused, L ankle 2+ edema chronic and unchanged    Assessment & Plan:   See  Encounters Tab for problem based charting.  Patient discussed with Dr. Eppie Gibson

## 2018-11-26 ENCOUNTER — Encounter: Payer: Self-pay | Admitting: Internal Medicine

## 2018-11-26 NOTE — Progress Notes (Signed)
Case discussed with Dr. Isac Sarna at the time of the visit. We reviewed the resident's history and exam and continuous glucose monitoring results. I agree with the assessment, diagnosis, and plan of care documented in the resident's note which we formulated together.

## 2018-11-26 NOTE — Assessment & Plan Note (Addendum)
Laverle Hobby wore the CGM for 7 days. The average reading was 78, % time in target was 48, % time below target was 51, and % time above target was. 1. Intervention will be to reduce Lantus dose.   She was prescribed Lantus 20U QHS but is currently taking Lantus 15U BID. She states a provider adjusted her Lantus dose, but she is unable to recall who. Her CBG was 39 when she presented to clinic and she required over 100g of carbohydrates during the visit to reach a CBG of 65. She was asymptomatic during this time. She has not been checking her BG at home this past week because of the CGM. I asked her to start checking them again and to hold Lantus until she sees a steady increase in BG. When she does, she will start 10 units BID and possibly lower it to 5 units BID if she continues to have persistent hypoglycemia while on 10 BID. I advised not to adjust her Lantus dose unless she discussed it with [place] first. Discussed that this severe, persistent hypoglycemia can be life-threatening and that she has to be cautious about the amount of insulin she is using given her advanced CKD.  She verbalized understanding.  The patient will be scheduled to see provider in [facility] for a final appointment next week.

## 2018-11-30 ENCOUNTER — Encounter: Payer: Self-pay | Admitting: Dietician

## 2018-11-30 ENCOUNTER — Ambulatory Visit: Payer: Self-pay | Admitting: Dietician

## 2018-11-30 ENCOUNTER — Encounter: Payer: Self-pay | Admitting: Internal Medicine

## 2018-11-30 ENCOUNTER — Ambulatory Visit (INDEPENDENT_AMBULATORY_CARE_PROVIDER_SITE_OTHER): Payer: Self-pay | Admitting: Internal Medicine

## 2018-11-30 VITALS — BP 196/73 | HR 66 | Temp 98.2°F | Wt 146.3 lb

## 2018-11-30 DIAGNOSIS — IMO0002 Reserved for concepts with insufficient information to code with codable children: Secondary | ICD-10-CM

## 2018-11-30 DIAGNOSIS — Z79899 Other long term (current) drug therapy: Secondary | ICD-10-CM

## 2018-11-30 DIAGNOSIS — Z794 Long term (current) use of insulin: Secondary | ICD-10-CM

## 2018-11-30 DIAGNOSIS — Z79890 Hormone replacement therapy (postmenopausal): Secondary | ICD-10-CM

## 2018-11-30 DIAGNOSIS — E1122 Type 2 diabetes mellitus with diabetic chronic kidney disease: Secondary | ICD-10-CM

## 2018-11-30 DIAGNOSIS — E890 Postprocedural hypothyroidism: Secondary | ICD-10-CM

## 2018-11-30 DIAGNOSIS — H0520 Unspecified exophthalmos: Secondary | ICD-10-CM

## 2018-11-30 DIAGNOSIS — I129 Hypertensive chronic kidney disease with stage 1 through stage 4 chronic kidney disease, or unspecified chronic kidney disease: Secondary | ICD-10-CM

## 2018-11-30 DIAGNOSIS — N184 Chronic kidney disease, stage 4 (severe): Secondary | ICD-10-CM

## 2018-11-30 DIAGNOSIS — Z87891 Personal history of nicotine dependence: Secondary | ICD-10-CM

## 2018-11-30 DIAGNOSIS — E1165 Type 2 diabetes mellitus with hyperglycemia: Secondary | ICD-10-CM

## 2018-11-30 DIAGNOSIS — E1140 Type 2 diabetes mellitus with diabetic neuropathy, unspecified: Secondary | ICD-10-CM

## 2018-11-30 DIAGNOSIS — I10 Essential (primary) hypertension: Secondary | ICD-10-CM

## 2018-11-30 LAB — POCT GLYCOSYLATED HEMOGLOBIN (HGB A1C): Hemoglobin A1C: 4.8 % (ref 4.0–5.6)

## 2018-11-30 LAB — GLUCOSE, CAPILLARY: Glucose-Capillary: 83 mg/dL (ref 70–99)

## 2018-11-30 MED ORDER — FUROSEMIDE 40 MG PO TABS: 40.0000 mg | ORAL_TABLET | Freq: Every day | ORAL | 1 refills | Status: DC

## 2018-11-30 MED ORDER — AMLODIPINE BESYLATE 5 MG PO TABS: 5.0000 mg | ORAL_TABLET | Freq: Every day | ORAL | 3 refills | Status: DC

## 2018-11-30 NOTE — Patient Instructions (Signed)
Thank you for allowing [place] to provide your care today. Today we discussed your blood pressure, kidney function, and diabetes.     I have ordered the following labs for you. I will call if any are abnormal.   Thyroid, CBC, and BMP.   Today we made some changes to your medications.   Please start taking amlodipine 5 mg daily Please start taking lasix 40 mg twice a day  Please follow-up in 2 weeks to see how this medications have worked.    Should you have any questions or concerns please call the internal medicine clinic at [phone].

## 2018-11-30 NOTE — Progress Notes (Signed)
Downloaded CGM week 2 results. Patient reported the sensor fell off Sunday evening 3/1. Reports taking varying amounts of insulin at different times of day. Dose was decreased today to 10 units. Encouraged to take insulin as prescribed at the same time each day to prevent insulin stacking. Risk of hypoglycemia highest during the day; she often skips meals in the afternoons.    CGM Results from Download #2 Average is  80  for 12 days Time in range (70-180 mg/dL): 55% (Goal >70%) Time above range (>180 mg/dL) 0% Time below range (<70 mg/dL): 45% (Goal is <3%) Coefficient of variation:44.1% (Goal is <36%) Glucose Management Indicator: 5.2%   Lab Results  Component Value Date   HGBA1C 4.8 11/30/2018    Plan for follow-up as needed; recommended repeat CGM for her in the future if she remains on diabetes medicine with side effect of hypoglycemia.    Kayla [name], RD 11/30/2018 4:20 PM.

## 2018-11-30 NOTE — Progress Notes (Signed)
CC: Diabetes, hypertension, hypothyroidism, CKD  HPI:  [name] is a 58 y.o.  with a PMH listed below presenting for diabetes, hypertension, hypothyroidism, and CKD.   Please see A&P for status of the patient's chronic medical conditions  Past Medical History:  Diagnosis Date  . ANA POSITIVE 02/09/2010  . Anal warts 11/09/2012  . Anxiety   . Arthritis    "my bones ache all the time" (06/27/2014)  . Cellulitis 06/27/2014   medial proximal left thigh extending to the perineium  . Chronic bronchitis ([place])    "get it changing of the seasons; sometimes q yr; sometimes not"   . Complication of anesthesia    sats drop post op  . COPD (chronic obstructive pulmonary disease) ([place])   . 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 Diabetic peripheral neuropathy ([place]) since <2005   "hands and feet"  . Dizziness    Multiple falls at home  . Fibromyalgia   . Graves' disease    "they nuked it twice"  . YNWGNFAO(130.8)    "monthly" (06/27/2014)  . Heart murmur    as a child-never had echo to evaluate  . Hyperlipidemia   . Hypertension   . Hypothyroidism   . Sickle cell trait ([place])   . Trimalleolar fracture of left ankle 11/21/2014  . Type 2 diabetes mellitus ([place])   . Wears glasses    Review of Systems: Refer to history of present illness and assessment and plans for pertinent review of systems, all others reviewed and negative.  Physical Exam:  Vitals:   11/30/18 1406 11/30/18 1457 11/30/18 1509  BP: (!) 202/77 (!) 194/75 (!) 196/73  Pulse: 71 63 66  Temp: 98.2 F (36.8 C)    TempSrc: Oral    SpO2: 100%    Weight: 146 lb 4.8 oz (66.4 kg)      Physical Exam  Constitutional: She is oriented to person, place, and time and well-developed, well-nourished, and in no distress.  HENT:  Head: Normocephalic and atraumatic.  Eyes: Pupils are equal, round, and reactive to light. Conjunctivae and EOM are normal.  Exophthalmos, no lid lag  Neck: Normal range of  motion. Neck supple. No thyromegaly present.  Cardiovascular: Normal rate and regular rhythm.  Pulmonary/Chest: Effort normal and breath sounds normal. No respiratory distress.  Abdominal: Soft. Bowel sounds are normal. She exhibits no distension.  Musculoskeletal: Normal range of motion.        General: Edema present.     Comments: Left left: edema and tenderness around left ankle, pulses and sensation intact  Neurological: She is alert and oriented to person, place, and time.  Skin: Skin is warm and dry.  Psychiatric: Mood and affect normal.    Social History   Socioeconomic History  . Marital status: Single    Spouse name: Not on file  . Number of children: Not on file  . Years of education: 41  . Highest education level: Not on file  Occupational History    Employer: UNEMPLOYED  Social Needs  . Financial resource strain: Not on file  . Food insecurity:    Worry: Not on file    Inability: Not on file  . Transportation needs:    Medical: Not on file    Non-medical: Not on file  Tobacco Use  . Smoking status: Former Smoker    Packs/day: 0.75    Years: 5.00    Pack years: 3.75    Types: Cigarettes  Last attempt to quit: 09/30/1990    Years since quitting: 28.1  . Smokeless tobacco: Never Used  Substance and Sexual Activity  . Alcohol use: No    Alcohol/week: 0.0 standard drinks  . Drug use: No  . Sexual activity: Not Currently  Lifestyle  . Physical activity:    Days per week: Not on file    Minutes per session: Not on file  . Stress: Not on file  Relationships  . Social connections:    Talks on phone: Not on file    Gets together: Not on file    Attends religious service: Not on file    Active member of club or organization: Not on file    Attends meetings of clubs or organizations: Not on file    Relationship status: Not on file  . Intimate partner violence:    Fear of current or ex partner: Not on file    Emotionally abused: Not on file    Physically  abused: Not on file    Forced sexual activity: Not on file  Other Topics Concern  . Not on file  Social History Narrative   Lives in [place], alone. Looking for a job. Has a son in town who checks in on her.    Family History  Problem Relation Age of Onset  . Emphysema Mother   . Cancer Mother        unknown  . Hypertension Mother   . Cancer Father        pancreatic   . Diabetes Father   . Hypertension Father   . Down syndrome Brother   . Cancer Maternal Aunt        cancer  . Cancer Other        breast cancer aunt  . Stroke Sister   . Diabetes Sister   . Colon cancer Neg Hx   . Stomach cancer Neg Hx     Assessment & Plan:   See Encounters Tab for problem based charting.  Patient discussed with Dr. Angelia Mould

## 2018-12-01 LAB — RENAL FUNCTION PANEL
Albumin: 3.9 g/dL (ref 3.8–4.9)
BUN/Creatinine Ratio: 12 (ref 9–23)
BUN: 44 mg/dL — ABNORMAL HIGH (ref 6–24)
CO2: 15 mmol/L — ABNORMAL LOW (ref 20–29)
Calcium: 9.6 mg/dL (ref 8.7–10.2)
Chloride: 117 mmol/L (ref 96–106)
Creatinine, Ser: 3.61 mg/dL — ABNORMAL HIGH (ref 0.57–1.00)
GFR calc Af Amer: 15 mL/min/{1.73_m2} — ABNORMAL LOW (ref >59)
GFR calc non Af Amer: 13 mL/min/{1.73_m2} — ABNORMAL LOW (ref >59)
Glucose: 79 mg/dL (ref 65–99)
Phosphorus: 4 mg/dL (ref 3.0–4.3)
Potassium: 5.3 mmol/L — ABNORMAL HIGH (ref 3.5–5.2)
Sodium: 143 mmol/L (ref 134–144)

## 2018-12-01 LAB — CBC WITH DIFFERENTIAL/PLATELET
Basophils Absolute: 0.1 10*3/uL (ref 0.0–0.2)
Basos: 2 %
EOS (ABSOLUTE): 0.4 10*3/uL (ref 0.0–0.4)
Eos: 6 %
Hematocrit: 26.7 % — ABNORMAL LOW (ref 34.0–46.6)
Hemoglobin: 8 g/dL — ABNORMAL LOW (ref 11.1–15.9)
Immature Grans (Abs): 0 10*3/uL (ref 0.0–0.1)
Immature Granulocytes: 0 %
Lymphocytes Absolute: 1.2 10*3/uL (ref 0.7–3.1)
Lymphs: 20 %
MCH: 28.4 pg (ref 26.6–33.0)
MCHC: 30 g/dL — ABNORMAL LOW (ref 31.5–35.7)
MCV: 95 fL (ref 79–97)
Monocytes Absolute: 0.5 10*3/uL (ref 0.1–0.9)
Monocytes: 8 %
Neutrophils Absolute: 3.8 10*3/uL (ref 1.4–7.0)
Neutrophils: 64 %
Platelets: 183 10*3/uL (ref 150–450)
RBC: 2.82 x10E6/uL — ABNORMAL LOW (ref 3.77–5.28)
RDW: 13.2 % (ref 11.7–15.4)
WBC: 5.9 10*3/uL (ref 3.4–10.8)

## 2018-12-01 LAB — TSH: TSH: 154.7 u[IU]/mL — ABNORMAL HIGH (ref 0.450–4.500)

## 2018-12-01 MED ORDER — LEVOTHYROXINE SODIUM 100 MCG PO TABS: 100.0000 ug | ORAL_TABLET | Freq: Every day | ORAL | 0 refills | Status: DC

## 2018-12-01 NOTE — Assessment & Plan Note (Addendum)
She is seeing nephrology, last saw on 06/30/18, CKD to be secondary to hypertension.  Expect will progress to ESRD rapidly. She reported that she has not seen them since due to them being so expensive however reported that she would be returning soon. She reported that she does not want dialysis until she is dying. She had family members on dialysis that had a very poor quality of life and she did not want this. Informed her that her kidney disease is progressing and that she will likely need to be started on dialysis soon. She reported that she will follow up with nephrology.  Plan: -Check renal function test -F/u with nephrology  Addendum: Renal function panel showed K 5.3, Cr 3.6. Has been having progression of her CKD. GFR no down to 13. Advised her to take her lasix which should help with the K. Will need to repeat renal function panel on follow up.

## 2018-12-01 NOTE — Assessment & Plan Note (Signed)
Patient is supposed to be taking labetalol .200 ,mg BID which was prescribed at her nephrologist office. She reports that she has not been taking it because it caused her to gag and she felt like it was causing her neck to swell up. She reports that she is not taking anything for her blood pressure right now.  Blood pressure today is elevated to 202/77, repeat checks showed 194/75.  Patient reports that she has been having headaches, pounding in her ears, and some nausea. She feels like all of her symptoms are related to her blood sugar levels. We discussed that these symptoms may be related to her blood pressure being too high and given her multiple adverse reactions to medications we discussed the possibility of being admitted for blood pressure control however the patient stated that she does not want to be admitted. We discussed the importance of taking her blood pressure medications and that we can restart amlodipine at a lower dose and have her start taking the lasix that she was prescribed.   Plan: -Start amlodipine 5 mg daily -Start lasix 40 mg BID -Informed patient to check her blood pressures and keep tack of the results -RTC in 1-2 weeks forblood pressure recheck -Renal function test

## 2018-12-01 NOTE — Assessment & Plan Note (Addendum)
Currently on Synthroid 75 mg daily, she is supposed to be on a higher dose and reports that she has significant side effects at higher dose so this was decreased.  She reports that she has been taking this medication, denies any side effects. She does report some light headedness and dizziness which she thinks is related to her blood sugar levels. Last TSH was on 12/15/2017 was 6.6.  Plan: -Continue Synthroid 75 mg daily -Repeat TSH, adjust Synthroid dose as needed  Addendum: TSH elevated to 154. Contacted patient and informed her to increase her Synthroid to 100 mg daily. Will recheck labs in 3 months.

## 2018-12-01 NOTE — Assessment & Plan Note (Addendum)
Diabetes mellitus: She is currently taking Lantus 10 units BID.  She wore the CGM for 12 days. The average reading was 80, % time in target was 55%, % time below target was 31, and % time above target was. 0. Intervention will be to decrease to the insulin to 5 units daily.  She reports that she does not check her blood sugars, states that sometimes she feels like it is low but is unable to estimate how often she feels like this.  A1c today is 4.8. We discussed that her blood sugars go very low and that we need to decrease her lantus since she is on too much.  Plan: -Decreased Lantus to 5 units daily, patient reported that she wanted to try 10 units daily. Informed her to check her blood sugars and can take 5 or 10 units depending on her sugars.  -A1c -BMP

## 2018-12-02 ENCOUNTER — Ambulatory Visit: Payer: Self-pay | Admitting: Dietician

## 2018-12-03 NOTE — Progress Notes (Signed)
Internal Medicine Clinic Attending  Case discussed with Dr. Sherry Ruffing at the time of the visit.  We reviewed the resident's history and exam and pertinent patient test results.  I agree with the assessment, diagnosis, and plan of care documented in the resident's note.   Discussed multiple competing issues.  Overall needs better blood pressure control to prevent progression of CKD.  She does have a mild hyperchloremic metabolic acidosis ideally she would be started on sodium bicarbonate however would like to get her blood pressure under better control does need to follow-up with nephrology.  I wonder about medication compliance with her Synthroid we will be increasing the dose and trying to follow closely.

## 2018-12-28 ENCOUNTER — Telehealth: Payer: Self-pay | Admitting: Psychology

## 2018-12-28 NOTE — Telephone Encounter (Signed)
[place] CALLED [phone] X [facility] TRYING TO FOLLOW UP ON PATIENT - I ADVISED THAT [facility] NEVER RETURNED PAPERWORK FROM December APPT WE HAVE NO SEEN SINCE THAT TIME.  DR [facility] WILL NEED TO SEE HER FOR VISIT DUE TO TIME PASSING AND WILL HAVE HER COMPLETE PAPERWORK - PHONED HOME TO SCHEDULE LVM

## 2018-12-31 ENCOUNTER — Ambulatory Visit: Payer: Self-pay | Admitting: Psychology

## 2019-02-04 ENCOUNTER — Encounter: Payer: Self-pay | Admitting: Psychology

## 2019-02-04 ENCOUNTER — Encounter

## 2019-02-18 ENCOUNTER — Encounter

## 2019-02-18 ENCOUNTER — Ambulatory Visit: Payer: Self-pay | Admitting: Psychology

## 2019-02-26 ENCOUNTER — Other Ambulatory Visit (HOSPITAL_COMMUNITY)
Admission: RE | Admit: 2019-02-26 | Discharge: 2019-02-26 | Disposition: A | Discharge status: Home / Self Care | Payer: Self-pay | Source: Ambulatory Visit | Attending: Internal Medicine | Admitting: Internal Medicine

## 2019-02-26 ENCOUNTER — Encounter: Payer: Self-pay | Admitting: Internal Medicine

## 2019-02-26 ENCOUNTER — Other Ambulatory Visit: Payer: Self-pay

## 2019-02-26 ENCOUNTER — Ambulatory Visit (INDEPENDENT_AMBULATORY_CARE_PROVIDER_SITE_OTHER): Payer: Self-pay | Admitting: Internal Medicine

## 2019-02-26 VITALS — BP 130/56 | HR 56 | Temp 98.3°F | Wt 144.2 lb

## 2019-02-26 DIAGNOSIS — I129 Hypertensive chronic kidney disease with stage 1 through stage 4 chronic kidney disease, or unspecified chronic kidney disease: Secondary | ICD-10-CM

## 2019-02-26 DIAGNOSIS — R21 Rash and other nonspecific skin eruption: Secondary | ICD-10-CM | POA: Insufficient documentation

## 2019-02-26 DIAGNOSIS — Z79890 Hormone replacement therapy (postmenopausal): Secondary | ICD-10-CM

## 2019-02-26 DIAGNOSIS — E890 Postprocedural hypothyroidism: Secondary | ICD-10-CM

## 2019-02-26 DIAGNOSIS — E1122 Type 2 diabetes mellitus with diabetic chronic kidney disease: Secondary | ICD-10-CM

## 2019-02-26 DIAGNOSIS — R3915 Urgency of urination: Secondary | ICD-10-CM | POA: Insufficient documentation

## 2019-02-26 DIAGNOSIS — R350 Frequency of micturition: Secondary | ICD-10-CM

## 2019-02-26 DIAGNOSIS — Z79899 Other long term (current) drug therapy: Secondary | ICD-10-CM

## 2019-02-26 DIAGNOSIS — N898 Other specified noninflammatory disorders of vagina: Secondary | ICD-10-CM | POA: Insufficient documentation

## 2019-02-26 DIAGNOSIS — N184 Chronic kidney disease, stage 4 (severe): Secondary | ICD-10-CM

## 2019-02-26 DIAGNOSIS — I10 Essential (primary) hypertension: Secondary | ICD-10-CM

## 2019-02-26 HISTORY — DX: Urgency of urination: R39.15

## 2019-02-26 LAB — CBC WITH DIFFERENTIAL/PLATELET
Abs Immature Granulocytes: 0.01 10*3/uL (ref 0.00–0.07)
Basophils Absolute: 0.1 10*3/uL (ref 0.0–0.1)
Basophils Relative: 1 %
Eosinophils Absolute: 0.4 10*3/uL (ref 0.0–0.5)
Eosinophils Relative: 7 %
HCT: 22.6 % — ABNORMAL LOW (ref 36.0–46.0)
Hemoglobin: 7.5 g/dL — ABNORMAL LOW (ref 12.0–15.0)
Immature Granulocytes: 0 %
Lymphocytes Relative: 19 %
Lymphs Abs: 1.1 10*3/uL (ref 0.7–4.0)
MCH: 30.4 pg (ref 26.0–34.0)
MCHC: 33.2 g/dL (ref 30.0–36.0)
MCV: 91.5 fL (ref 80.0–100.0)
Monocytes Absolute: 0.5 10*3/uL (ref 0.1–1.0)
Monocytes Relative: 9 %
Neutro Abs: 3.8 10*3/uL (ref 1.7–7.7)
Neutrophils Relative %: 64 %
Platelets: 138 10*3/uL — ABNORMAL LOW (ref 150–400)
RBC: 2.47 MIL/uL — ABNORMAL LOW (ref 3.87–5.11)
RDW: 14 % (ref 11.5–15.5)
WBC: 5.9 10*3/uL (ref 4.0–10.5)
nRBC: 0 % (ref 0.0–0.2)

## 2019-02-26 LAB — TSH: TSH: 196.14 u[IU]/mL — ABNORMAL HIGH (ref 0.350–4.500)

## 2019-02-26 LAB — POCT URINALYSIS DIPSTICK
Bilirubin, UA: NEGATIVE
Glucose, UA: NEGATIVE
Ketones, UA: NEGATIVE
Nitrite, UA: NEGATIVE
Protein, UA: POSITIVE — AB
Spec Grav, UA: 1.02 (ref 1.010–1.025)
Urobilinogen, UA: 0.2 E.U./dL
pH, UA: 5.5 (ref 5.0–8.0)

## 2019-02-26 LAB — RENAL FUNCTION PANEL
Albumin: 3.1 g/dL — ABNORMAL LOW (ref 3.5–5.0)
Anion gap: 8 (ref 5–15)
BUN: 52 mg/dL — ABNORMAL HIGH (ref 6–20)
CO2: 14 mmol/L — ABNORMAL LOW (ref 22–32)
Calcium: 8.5 mg/dL — ABNORMAL LOW (ref 8.9–10.3)
Chloride: 116 mmol/L — ABNORMAL HIGH (ref 98–111)
Creatinine, Ser: 4.59 mg/dL — ABNORMAL HIGH (ref 0.44–1.00)
GFR calc Af Amer: 12 mL/min — ABNORMAL LOW (ref >60)
GFR calc non Af Amer: 10 mL/min — ABNORMAL LOW (ref >60)
Glucose, Bld: 129 mg/dL — ABNORMAL HIGH (ref 70–99)
Phosphorus: 4.7 mg/dL — ABNORMAL HIGH (ref 2.5–4.6)
Potassium: 4.7 mmol/L (ref 3.5–5.1)
Sodium: 138 mmol/L (ref 135–145)

## 2019-02-26 MED ORDER — KETOCONAZOLE 2 % EX SHAM: 1.0000 "application " | MEDICATED_SHAMPOO | CUTANEOUS | 0 refills | Status: DC

## 2019-02-26 MED ORDER — CEPHALEXIN 500 MG PO CAPS: 500.0000 mg | ORAL_CAPSULE | Freq: Two times a day (BID) | ORAL | 0 refills | Status: AC

## 2019-02-26 MED ORDER — LEVOTHYROXINE SODIUM 150 MCG PO TABS: 150.0000 ug | ORAL_TABLET | Freq: Every day | ORAL | 4 refills | Status: DC

## 2019-02-26 MED ORDER — FLUCONAZOLE 150 MG PO TABS: 150.0000 mg | ORAL_TABLET | Freq: Every day | ORAL | 0 refills | Status: DC

## 2019-02-26 MED FILL — FLUCONAZOLE 150 MG TABS: 150 | 3 days supply | Qty: 3 | Fill #0

## 2019-02-26 MED FILL — KETOCONAZOLE 2 % SHAM: 2 | 30 days supply | Qty: 120 | Fill #0

## 2019-02-26 MED FILL — LEVOTHYROXINE 150 MCG TAB: 150 | 30 days supply | Qty: 30 | Fill #0

## 2019-02-26 MED FILL — FUROSEMIDE 40 MG TAB: 40 | 30 days supply | Qty: 30 | Fill #0

## 2019-02-26 MED FILL — CEPHALEXIN 500 MG CAPSULE: 500 | 5 days supply | Qty: 10 | Fill #0

## 2019-02-26 NOTE — Assessment & Plan Note (Signed)
Urine dipstick was positive with small amount of blood and few leukocytes.  Due to cost issues we did not send her urine for culture. We will treat her empirically with Keflex 500 mg twice daily.  If her symptoms does not improve, she will need a urine culture.

## 2019-02-26 NOTE — Progress Notes (Signed)
   CC: Bad smelling urine, vaginal discharge and rash all over the body.  HPI:  [name] is a 58 y.o. with past medical history as listed below came today with multiple complaints of bad smelling urine, urinary urgency and frequency, vaginal pruritic discharge and pruritic rash all over her body.  Please see assessment and plan for her chronic conditions.  Past Medical History:  Diagnosis Date  . ANA POSITIVE 02/09/2010  . Anal warts 11/09/2012  . Anxiety   . Arthritis    "my bones ache all the time" (06/27/2014)  . Cellulitis 06/27/2014   medial proximal left thigh extending to the perineium  . Chronic bronchitis ([place])    "get it changing of the seasons; sometimes q yr; sometimes not"   . Complication of anesthesia    sats drop post op  . COPD (chronic obstructive pulmonary disease) ([place])   . 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 Diabetic peripheral neuropathy ([place]) since <2005   "hands and feet"  . Dizziness    Multiple falls at home  . Fibromyalgia   . Graves' disease    "they nuked it twice"  . KGSUPJSR(159.4)    "monthly" (06/27/2014)  . Heart murmur    as a child-never had echo to evaluate  . Hyperlipidemia   . Hypertension   . Hypothyroidism   . Sickle cell trait ([place])   . Trimalleolar fracture of left ankle 11/21/2014  . Type 2 diabetes mellitus ([place])   . Wears glasses    Review of Systems: Negative except mentioned in HPI.  Physical Exam:  Vitals:   02/26/19 0923  BP: (!) 130/56  Pulse: (!) 56  Temp: 98.3 F (36.8 C)  TempSrc: Oral  SpO2: 100%  Weight: 144 lb 3.2 oz (65.4 kg)   General: Vital signs reviewed.  Patient is well-developed and well-nourished, in no acute distress and cooperative with exam.  Head: Normocephalic and atraumatic. Eyes: EOMI, conjunctivae normal, no scleral icterus.  Cardiovascular: RRR, S1 normal, S2 normal, no murmurs, gallops, or rubs. Pulmonary/Chest: Clear to auscultation bilaterally, no  wheezes, rales, or rhonchi. Abdominal: Soft, mild left lower quadrant tenderness, non-distended, BS +,  Extremities: No lower extremity edema bilaterally,  pulses symmetric and intact bilaterally. No cyanosis or clubbing. Skin: Patchy scattered rash involving her extremities and torso, few excoriation marks scattered all over. Psychiatric: Normal mood and affect. speech and behavior is normal.   Assessment & Plan:   See Encounters Tab for problem based charting.  Patient discussed with Dr. Rebeca Alert.

## 2019-02-26 NOTE — Patient Instructions (Signed)
Thank you for visiting clinic today. I am giving you antibiotics for your urinary symptoms, we are not sending any culture at this time due to cost, if your symptoms get worse or fail to resolve please come back to the clinic and at that point we will sent a culture. I am also giving you Diflucan pills for your yeast infection, you will take 1 pill today, repeat in 3 days twice. I am also giving you a ketoconazole shampoo to wash your body for your rash. We are checking your kidney functions and thyroid functions today-we will call you with any abnormal results. Please follow-up with your PCP according to your scheduled appointment.

## 2019-02-26 NOTE — Assessment & Plan Note (Signed)
She is having pruritic milky white discharge, most likely yeast infection due to her diabetes.  -The [place]. -Give her prescription for Diflucan.

## 2019-02-26 NOTE — Progress Notes (Signed)
Internal Medicine Clinic Attending  Case discussed with Dr. Reesa Chew at the time of the visit.  We reviewed the resident's history and exam and pertinent patient test results.  I agree with the assessment, diagnosis, and plan of care documented in the resident's note.  Multiple medical issues and concerns today, but management limited by financial resources. Most pressing is her progressive renal failure with worsening acidosis and hyperphosphatemia. She needs to follow up with nephrology and likely start bicarb and phoslo, she may be nearing dialysis.   Lenice Pressman, M.D., Ph.D.

## 2019-02-26 NOTE — Assessment & Plan Note (Signed)
BP Readings from Last 3 Encounters:  02/26/19 (!) 130/56  11/30/18 (!) 196/73  11/25/18 (!) 159/75   Her blood pressure was within goal today. Per patient she was given some samples of losartan from her nephrologist and she is using them, do not know anything about the strength. Also using amlodipine. Has not started using Lasix yet.  -Advised her to continue the current management and follow-up with nephrology.

## 2019-02-26 NOTE — Assessment & Plan Note (Signed)
Patient have worsening renal function with GFR decreased to 13 today.  Her renal functions also show hyperchloremic metabolic acidosis with bicarb of 13.  Elevated phosphorous and worsening BUN/creatinine. Initially during exam patient was very resistant to go to her nephrologist, stating that she does not have any money.  Called the patient after getting renal function results and after discussing that she most likely needs dialysis and that can qualify her for Medicare she agrees to go see her nephrologist as soon as possible. -She should be started on bicarb and PhosLo.  He would like to discuss this with her nephrologist before starting any other new medicine at this time. -She does not exhibit any signs of uremia currently.

## 2019-02-26 NOTE — Assessment & Plan Note (Signed)
Her pruritic rash is more consistent with fungal skin infection.  -Give her a prescription for ketoconazole 2% shampoo. -If her symptoms fail to resolve she might need a biopsy.

## 2019-02-26 NOTE — Assessment & Plan Note (Addendum)
Patient is complained of constipation was most likely due to her hypothyroidism. Last TSH was 154 and her dose was increased from 75- 100. Per patient she is taking her thyroid medicine but do skip doses.  -Repeated TSH today-which increased to 196. I really think there is some compliance issue, as her TSH increased further despite increasing the dose. -Called the patient again and increase her dose to 150 daily, and advised her to take it every day. -She will need a repeat TSH in 4 to 6 weeks.

## 2019-03-01 ENCOUNTER — Ambulatory Visit: Payer: Self-pay

## 2019-03-01 ENCOUNTER — Other Ambulatory Visit: Payer: Self-pay | Admitting: Internal Medicine

## 2019-03-01 LAB — CERVICOVAGINAL ANCILLARY ONLY
Bacterial vaginitis: POSITIVE — AB
Candida vaginitis: NEGATIVE
Chlamydia: NEGATIVE
Neisseria Gonorrhea: NEGATIVE
Trichomonas: NEGATIVE

## 2019-03-01 MED ORDER — METRONIDAZOLE 500 MG PO TABS: 500.0000 mg | ORAL_TABLET | Freq: Two times a day (BID) | ORAL | 0 refills | Status: AC

## 2019-03-01 MED FILL — metroNIDAZOLE 500 MG TABS: 500 | 7 days supply | Qty: 14 | Fill #0

## 2019-03-17 ENCOUNTER — Ambulatory Visit: Payer: Self-pay

## 2019-03-25 ENCOUNTER — Ambulatory Visit: Payer: Self-pay | Admitting: Psychology

## 2019-04-05 ENCOUNTER — Ambulatory Visit: Payer: Self-pay

## 2019-04-06 NOTE — Addendum Note (Signed)
Addended by: Hulan Fray on: 04/06/2019 04:35 PM   Modules accepted: Orders

## 2019-04-19 NOTE — Progress Notes (Deleted)
CC: ***  HPI:  [name] is a 58 y.o.  with a PMH listed below presenting for ***   Please see A&P for status of the patient's chronic medical conditions  Past Medical History:  Diagnosis Date  . ANA POSITIVE 02/09/2010  . Anal warts 11/09/2012  . Anxiety   . Arthritis    "my bones ache all the time" (06/27/2014)  . Cellulitis 06/27/2014   medial proximal left thigh extending to the perineium  . Chronic bronchitis ([place])    "get it changing of the seasons; sometimes q yr; sometimes not"   . Complication of anesthesia    sats drop post op  . COPD (chronic obstructive pulmonary disease) ([place])   . 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 Diabetic peripheral neuropathy ([place]) since <2005   "hands and feet"  . Dizziness    Multiple falls at home  . Fibromyalgia   . Graves' disease    "they nuked it twice"  . VXBLTJQZ(009.2)    "monthly" (06/27/2014)  . Heart murmur    as a child-never had echo to evaluate  . Hyperlipidemia   . Hypertension   . Hypothyroidism   . Sickle cell trait ([place])   . Trimalleolar fracture of left ankle 11/21/2014  . Type 2 diabetes mellitus ([place])   . Wears glasses    Review of Systems: Refer to history of present illness and assessment and plans for pertinent review of systems, all others reviewed and negative.  Physical Exam:  There were no vitals filed for this visit. *** Physical Exam  Social History   Socioeconomic History  . Marital status: Single    Spouse name: Not on file  . Number of children: Not on file  . Years of education: 83  . Highest education level: Not on file  Occupational History    Employer: UNEMPLOYED  Social Needs  . Financial resource strain: Not on file  . Food insecurity    Worry: Not on file    Inability: Not on file  . Transportation needs    Medical: Not on file    Non-medical: Not on file  Tobacco Use  . Smoking status: Former Smoker    Packs/day: 0.75    Years: 5.00    Pack  years: 3.75    Types: Cigarettes    Quit date: 09/30/1990    Years since quitting: 28.5  . Smokeless tobacco: Never Used  Substance and Sexual Activity  . Alcohol use: No    Alcohol/week: 0.0 standard drinks  . Drug use: No  . Sexual activity: Not Currently  Lifestyle  . Physical activity    Days per week: Not on file    Minutes per session: Not on file  . Stress: Not on file  Relationships  . Social Herbalist on phone: Not on file    Gets together: Not on file    Attends religious service: Not on file    Active member of club or organization: Not on file    Attends meetings of clubs or organizations: Not on file    Relationship status: Not on file  . Intimate partner violence    Fear of current or ex partner: Not on file    Emotionally abused: Not on file    Physically abused: Not on file    Forced sexual activity: Not on file  Other Topics Concern  . Not on file  Social History  Narrative   Lives in [place], alone. Looking for a job. Has a son in town who checks in on her.    *** Family History  Problem Relation Age of Onset  . Emphysema Mother   . Cancer Mother        unknown  . Hypertension Mother   . Cancer Father        pancreatic   . Diabetes Father   . Hypertension Father   . Down syndrome Brother   . Cancer Maternal Aunt        cancer  . Cancer Other        breast cancer aunt  . Stroke Sister   . Diabetes Sister   . Colon cancer Neg Hx   . Stomach cancer Neg Hx     Assessment & Plan:   See Encounters Tab for problem based charting.  Patient {GC/GE:3044014::"discussed with","seen with"} Dr. {NAMES:3044014::"Butcher","Granfortuna","E. Hoffman","Klima","Mullen","Narendra","Raines","Vincent"}

## 2019-04-21 ENCOUNTER — Encounter: Payer: Self-pay | Admitting: Internal Medicine

## 2019-04-26 ENCOUNTER — Other Ambulatory Visit: Payer: Self-pay

## 2019-04-26 ENCOUNTER — Inpatient Hospital Stay (HOSPITAL_COMMUNITY)
Admission: AD | Admit: 2019-04-26 | Discharge: 2019-05-06 | DRG: 377 | Disposition: A | Discharge status: Home Health | Payer: Self-pay | Source: Ambulatory Visit | Attending: Internal Medicine | Admitting: Internal Medicine

## 2019-04-26 ENCOUNTER — Ambulatory Visit (INDEPENDENT_AMBULATORY_CARE_PROVIDER_SITE_OTHER): Payer: Self-pay | Admitting: Internal Medicine

## 2019-04-26 VITALS — BP 147/61 | HR 75 | Temp 100.9°F | Ht 62.0 in

## 2019-04-26 DIAGNOSIS — R296 Repeated falls: Secondary | ICD-10-CM | POA: Diagnosis present

## 2019-04-26 DIAGNOSIS — Z87891 Personal history of nicotine dependence: Secondary | ICD-10-CM

## 2019-04-26 DIAGNOSIS — Z881 Allergy status to other antibiotic agents status: Secondary | ICD-10-CM

## 2019-04-26 DIAGNOSIS — Z9181 History of falling: Secondary | ICD-10-CM

## 2019-04-26 DIAGNOSIS — D573 Sickle-cell trait: Secondary | ICD-10-CM | POA: Diagnosis present

## 2019-04-26 DIAGNOSIS — E1122 Type 2 diabetes mellitus with diabetic chronic kidney disease: Secondary | ICD-10-CM

## 2019-04-26 DIAGNOSIS — N184 Chronic kidney disease, stage 4 (severe): Secondary | ICD-10-CM

## 2019-04-26 DIAGNOSIS — I129 Hypertensive chronic kidney disease with stage 1 through stage 4 chronic kidney disease, or unspecified chronic kidney disease: Secondary | ICD-10-CM

## 2019-04-26 DIAGNOSIS — Z923 Personal history of irradiation: Secondary | ICD-10-CM

## 2019-04-26 DIAGNOSIS — R509 Fever, unspecified: Secondary | ICD-10-CM

## 2019-04-26 DIAGNOSIS — K2971 Gastritis, unspecified, with bleeding: Principal | ICD-10-CM | POA: Diagnosis present

## 2019-04-26 DIAGNOSIS — Z885 Allergy status to narcotic agent status: Secondary | ICD-10-CM

## 2019-04-26 DIAGNOSIS — Z79890 Hormone replacement therapy (postmenopausal): Secondary | ICD-10-CM

## 2019-04-26 DIAGNOSIS — R110 Nausea: Secondary | ICD-10-CM | POA: Diagnosis present

## 2019-04-26 DIAGNOSIS — Z882 Allergy status to sulfonamides status: Secondary | ICD-10-CM

## 2019-04-26 DIAGNOSIS — K208 Other esophagitis: Secondary | ICD-10-CM | POA: Diagnosis present

## 2019-04-26 DIAGNOSIS — K5909 Other constipation: Secondary | ICD-10-CM | POA: Diagnosis present

## 2019-04-26 DIAGNOSIS — R0989 Other specified symptoms and signs involving the circulatory and respiratory systems: Secondary | ICD-10-CM

## 2019-04-26 DIAGNOSIS — B9681 Helicobacter pylori [H. pylori] as the cause of diseases classified elsewhere: Secondary | ICD-10-CM | POA: Diagnosis present

## 2019-04-26 DIAGNOSIS — R531 Weakness: Secondary | ICD-10-CM

## 2019-04-26 DIAGNOSIS — Z841 Family history of disorders of kidney and ureter: Secondary | ICD-10-CM

## 2019-04-26 DIAGNOSIS — H9191 Unspecified hearing loss, right ear: Secondary | ICD-10-CM | POA: Diagnosis present

## 2019-04-26 DIAGNOSIS — Z5320 Procedure and treatment not carried out because of patient's decision for unspecified reasons: Secondary | ICD-10-CM | POA: Diagnosis not present

## 2019-04-26 DIAGNOSIS — Z9114 Patient's other noncompliance with medication regimen: Secondary | ICD-10-CM

## 2019-04-26 DIAGNOSIS — E875 Hyperkalemia: Secondary | ICD-10-CM | POA: Diagnosis present

## 2019-04-26 DIAGNOSIS — E039 Hypothyroidism, unspecified: Secondary | ICD-10-CM

## 2019-04-26 DIAGNOSIS — J440 Chronic obstructive pulmonary disease with acute lower respiratory infection: Secondary | ICD-10-CM | POA: Diagnosis not present

## 2019-04-26 DIAGNOSIS — Z993 Dependence on wheelchair: Secondary | ICD-10-CM

## 2019-04-26 DIAGNOSIS — Z9049 Acquired absence of other specified parts of digestive tract: Secondary | ICD-10-CM

## 2019-04-26 DIAGNOSIS — N179 Acute kidney failure, unspecified: Secondary | ICD-10-CM | POA: Diagnosis present

## 2019-04-26 DIAGNOSIS — Z8249 Family history of ischemic heart disease and other diseases of the circulatory system: Secondary | ICD-10-CM

## 2019-04-26 DIAGNOSIS — H0520 Unspecified exophthalmos: Secondary | ICD-10-CM | POA: Diagnosis present

## 2019-04-26 DIAGNOSIS — Z79899 Other long term (current) drug therapy: Secondary | ICD-10-CM

## 2019-04-26 DIAGNOSIS — J984 Other disorders of lung: Secondary | ICD-10-CM

## 2019-04-26 DIAGNOSIS — J189 Pneumonia, unspecified organism: Secondary | ICD-10-CM | POA: Diagnosis not present

## 2019-04-26 DIAGNOSIS — K269 Duodenal ulcer, unspecified as acute or chronic, without hemorrhage or perforation: Secondary | ICD-10-CM | POA: Diagnosis present

## 2019-04-26 DIAGNOSIS — IMO0002 Reserved for concepts with insufficient information to code with codable children: Secondary | ICD-10-CM | POA: Diagnosis present

## 2019-04-26 DIAGNOSIS — Z794 Long term (current) use of insulin: Secondary | ICD-10-CM

## 2019-04-26 DIAGNOSIS — M797 Fibromyalgia: Secondary | ICD-10-CM | POA: Diagnosis present

## 2019-04-26 DIAGNOSIS — Z9889 Other specified postprocedural states: Secondary | ICD-10-CM

## 2019-04-26 DIAGNOSIS — F329 Major depressive disorder, single episode, unspecified: Secondary | ICD-10-CM | POA: Diagnosis present

## 2019-04-26 DIAGNOSIS — D72829 Elevated white blood cell count, unspecified: Secondary | ICD-10-CM

## 2019-04-26 DIAGNOSIS — Z888 Allergy status to other drugs, medicaments and biological substances status: Secondary | ICD-10-CM

## 2019-04-26 DIAGNOSIS — D631 Anemia in chronic kidney disease: Secondary | ICD-10-CM | POA: Diagnosis present

## 2019-04-26 DIAGNOSIS — E871 Hypo-osmolality and hyponatremia: Secondary | ICD-10-CM | POA: Diagnosis present

## 2019-04-26 DIAGNOSIS — E8809 Other disorders of plasma-protein metabolism, not elsewhere classified: Secondary | ICD-10-CM | POA: Diagnosis present

## 2019-04-26 DIAGNOSIS — E11649 Type 2 diabetes mellitus with hypoglycemia without coma: Secondary | ICD-10-CM | POA: Diagnosis not present

## 2019-04-26 DIAGNOSIS — Z832 Family history of diseases of the blood and blood-forming organs and certain disorders involving the immune mechanism: Secondary | ICD-10-CM

## 2019-04-26 DIAGNOSIS — F419 Anxiety disorder, unspecified: Secondary | ICD-10-CM | POA: Diagnosis present

## 2019-04-26 DIAGNOSIS — Z91040 Latex allergy status: Secondary | ICD-10-CM

## 2019-04-26 DIAGNOSIS — E1140 Type 2 diabetes mellitus with diabetic neuropathy, unspecified: Secondary | ICD-10-CM | POA: Diagnosis present

## 2019-04-26 DIAGNOSIS — I10 Essential (primary) hypertension: Secondary | ICD-10-CM

## 2019-04-26 DIAGNOSIS — E1142 Type 2 diabetes mellitus with diabetic polyneuropathy: Secondary | ICD-10-CM | POA: Diagnosis present

## 2019-04-26 DIAGNOSIS — R630 Anorexia: Secondary | ICD-10-CM | POA: Diagnosis present

## 2019-04-26 DIAGNOSIS — Z87311 Personal history of (healed) other pathological fracture: Secondary | ICD-10-CM

## 2019-04-26 DIAGNOSIS — R011 Cardiac murmur, unspecified: Secondary | ICD-10-CM | POA: Diagnosis present

## 2019-04-26 DIAGNOSIS — D649 Anemia, unspecified: Secondary | ICD-10-CM

## 2019-04-26 DIAGNOSIS — E890 Postprocedural hypothyroidism: Secondary | ICD-10-CM | POA: Diagnosis present

## 2019-04-26 DIAGNOSIS — D500 Iron deficiency anemia secondary to blood loss (chronic): Secondary | ICD-10-CM | POA: Diagnosis present

## 2019-04-26 HISTORY — DX: Weakness: R53.1

## 2019-04-26 LAB — CBC WITH DIFFERENTIAL/PLATELET
Abs Immature Granulocytes: 0.65 10*3/uL — ABNORMAL HIGH (ref 0.00–0.07)
Basophils Absolute: 0 10*3/uL (ref 0.0–0.1)
Basophils Relative: 0 %
Eosinophils Absolute: 0.5 10*3/uL (ref 0.0–0.5)
Eosinophils Relative: 3 %
HCT: 14.6 % — ABNORMAL LOW (ref 36.0–46.0)
Hemoglobin: 4.7 g/dL — CL (ref 12.0–15.0)
Immature Granulocytes: 4 %
Lymphocytes Relative: 4 %
Lymphs Abs: 0.7 10*3/uL (ref 0.7–4.0)
MCH: 29.9 pg (ref 26.0–34.0)
MCHC: 32.2 g/dL (ref 30.0–36.0)
MCV: 93 fL (ref 80.0–100.0)
Monocytes Absolute: 0.6 10*3/uL (ref 0.1–1.0)
Monocytes Relative: 3 %
Neutro Abs: 15.8 10*3/uL — ABNORMAL HIGH (ref 1.7–7.7)
Neutrophils Relative %: 86 %
Platelets: 217 10*3/uL (ref 150–400)
RBC: 1.57 MIL/uL — ABNORMAL LOW (ref 3.87–5.11)
RDW: 18 % — ABNORMAL HIGH (ref 11.5–15.5)
WBC: 18.3 10*3/uL — ABNORMAL HIGH (ref 4.0–10.5)
nRBC: 0.1 % (ref 0.0–0.2)

## 2019-04-26 LAB — RETICULOCYTES
Immature Retic Fract: 24.1 % — ABNORMAL HIGH (ref 2.3–15.9)
RBC.: 1.57 MIL/uL — ABNORMAL LOW (ref 3.87–5.11)
Retic Count, Absolute: 82.4 10*3/uL (ref 19.0–186.0)
Retic Ct Pct: 5.3 % — ABNORMAL HIGH (ref 0.4–3.1)

## 2019-04-26 LAB — COMPREHENSIVE METABOLIC PANEL
ALT: 12 U/L (ref 0–44)
AST: 13 U/L — ABNORMAL LOW (ref 15–41)
Albumin: 2.2 g/dL — ABNORMAL LOW (ref 3.5–5.0)
Alkaline Phosphatase: 153 U/L — ABNORMAL HIGH (ref 38–126)
Anion gap: 12 (ref 5–15)
BUN: 70 mg/dL — ABNORMAL HIGH (ref 6–20)
CO2: 11 mmol/L — ABNORMAL LOW (ref 22–32)
Calcium: 9.3 mg/dL (ref 8.9–10.3)
Chloride: 109 mmol/L (ref 98–111)
Creatinine, Ser: 6.09 mg/dL — ABNORMAL HIGH (ref 0.44–1.00)
GFR calc Af Amer: 8 mL/min — ABNORMAL LOW (ref >60)
GFR calc non Af Amer: 7 mL/min — ABNORMAL LOW (ref >60)
Glucose, Bld: 252 mg/dL — ABNORMAL HIGH (ref 70–99)
Potassium: 5.4 mmol/L — ABNORMAL HIGH (ref 3.5–5.1)
Sodium: 132 mmol/L — ABNORMAL LOW (ref 135–145)
Total Bilirubin: 1.1 mg/dL (ref 0.3–1.2)
Total Protein: 6.6 g/dL (ref 6.5–8.1)

## 2019-04-26 LAB — FERRITIN: Ferritin: 691 ng/mL — ABNORMAL HIGH (ref 11–307)

## 2019-04-26 LAB — VITAMIN B12: Vitamin B-12: 1777 pg/mL — ABNORMAL HIGH (ref 180–914)

## 2019-04-26 LAB — SAVE SMEAR(SSMR), FOR PROVIDER SLIDE REVIEW

## 2019-04-26 LAB — PREPARE RBC (CROSSMATCH)

## 2019-04-26 LAB — TSH: TSH: 64.816 u[IU]/mL — ABNORMAL HIGH (ref 0.350–4.500)

## 2019-04-26 LAB — HEMOGLOBIN A1C
Hgb A1c MFr Bld: 5 % (ref 4.8–5.6)
Mean Plasma Glucose: 96.8 mg/dL

## 2019-04-26 LAB — IRON AND TIBC
Iron: 11 ug/dL — ABNORMAL LOW (ref 28–170)
Saturation Ratios: 7 % — ABNORMAL LOW (ref 10.4–31.8)
TIBC: 165 ug/dL — ABNORMAL LOW (ref 250–450)
UIBC: 154 ug/dL

## 2019-04-26 LAB — SARS CORONAVIRUS 2 BY RT PCR (HOSPITAL ORDER, PERFORMED IN ~~LOC~~ HOSPITAL LAB): SARS Coronavirus 2: NEGATIVE

## 2019-04-26 LAB — GLUCOSE, CAPILLARY: Glucose-Capillary: 296 mg/dL — ABNORMAL HIGH (ref 70–99)

## 2019-04-26 LAB — LACTATE DEHYDROGENASE: LDH: 186 U/L (ref 98–192)

## 2019-04-26 MED ORDER — INSULIN GLARGINE 100 UNIT/ML ~~LOC~~ SOLN
10.0000 [IU] | Freq: Every day | SUBCUTANEOUS | Status: DC
  Administered 2019-04-26 – 2019-04-28 (×2): 10 [IU] via SUBCUTANEOUS
  Filled 2019-04-26 (×5): qty 0.1

## 2019-04-26 MED ORDER — INSULIN ASPART 100 UNIT/ML ~~LOC~~ SOLN
0.0000 [IU] | Freq: Every day | SUBCUTANEOUS | Status: DC
  Administered 2019-04-26: 3 [IU] via SUBCUTANEOUS
  Administered 2019-05-05: 2 [IU] via SUBCUTANEOUS

## 2019-04-26 MED ORDER — ACETAMINOPHEN 650 MG RE SUPP: 650.0000 mg | Freq: Four times a day (QID) | RECTAL | Status: DC | PRN

## 2019-04-26 MED ORDER — LEVOTHYROXINE SODIUM 75 MCG PO TABS
150.0000 ug | ORAL_TABLET | Freq: Every day | ORAL | Status: DC
  Administered 2019-04-27 – 2019-05-03 (×7): 150 ug via ORAL
  Filled 2019-04-26 (×8): qty 2

## 2019-04-26 MED ORDER — SODIUM CHLORIDE 0.9% FLUSH: 10.0000 mL | INTRAVENOUS | Status: DC | PRN

## 2019-04-26 MED ORDER — SODIUM CHLORIDE 0.9% FLUSH
10.0000 mL | Freq: Two times a day (BID) | INTRAVENOUS | Status: DC
  Administered 2019-04-26 – 2019-04-30 (×4): 10 mL

## 2019-04-26 MED ORDER — ONDANSETRON HCL 4 MG/2ML IJ SOLN
4.0000 mg | Freq: Four times a day (QID) | INTRAMUSCULAR | Status: DC | PRN
  Administered 2019-04-29 – 2019-05-05 (×4): 4 mg via INTRAVENOUS
  Filled 2019-04-26 (×5): qty 2

## 2019-04-26 MED ORDER — SODIUM CHLORIDE 0.9 % IV SOLN
INTRAVENOUS | Status: AC
  Administered 2019-04-26 – 2019-04-27 (×3): via INTRAVENOUS

## 2019-04-26 MED ORDER — TRAMADOL HCL 50 MG PO TABS
50.0000 mg | ORAL_TABLET | Freq: Two times a day (BID) | ORAL | Status: DC | PRN
  Administered 2019-04-26 – 2019-05-05 (×12): 50 mg via ORAL
  Filled 2019-04-26 (×12): qty 1

## 2019-04-26 MED ORDER — ACETAMINOPHEN 325 MG PO TABS
650.0000 mg | ORAL_TABLET | Freq: Four times a day (QID) | ORAL | Status: DC | PRN
  Administered 2019-04-26 – 2019-05-05 (×5): 650 mg via ORAL
  Filled 2019-04-26 (×5): qty 2

## 2019-04-26 MED ORDER — INSULIN ASPART 100 UNIT/ML ~~LOC~~ SOLN
0.0000 [IU] | Freq: Three times a day (TID) | SUBCUTANEOUS | Status: DC
  Administered 2019-04-27 – 2019-04-29 (×2): 2 [IU] via SUBCUTANEOUS
  Administered 2019-04-29: 1 [IU] via SUBCUTANEOUS
  Administered 2019-04-29: 0 [IU] via SUBCUTANEOUS
  Administered 2019-05-03 – 2019-05-05 (×2): 1 [IU] via SUBCUTANEOUS
  Administered 2019-05-05 (×2): 2 [IU] via SUBCUTANEOUS
  Administered 2019-05-06: 1 [IU] via SUBCUTANEOUS
  Administered 2019-05-06: 2 [IU] via SUBCUTANEOUS

## 2019-04-26 MED ORDER — SODIUM CHLORIDE 0.9% FLUSH
3.0000 mL | Freq: Two times a day (BID) | INTRAVENOUS | Status: DC
  Administered 2019-04-27 – 2019-05-06 (×13): 3 mL via INTRAVENOUS

## 2019-04-26 MED ORDER — SODIUM CHLORIDE 0.9% IV SOLUTION
Freq: Once | INTRAVENOUS | Status: AC
  Administered 2019-04-26: 17:00:00 via INTRAVENOUS

## 2019-04-26 NOTE — Progress Notes (Signed)
   CC: weakness  HPI:  [name] is a 58 y.o. female with a past medical history significant for essential hypertension, type 2 diabetes, hypothyroidism, CKD 4, sickle cell trait, major depression disorder, fibromyalgia.  Is accompanied by her daughter as she presents fo Patient notes that she has been unabler 3-week history of weakness and general malaise.  to get out of her wheelchair during these 3 weeks.  She does endorse increased diarrhea during these 3 weeks however she reports that this is resolved.  She has been  incontinent of stool and urine.  She denies any fevers during this time.  She denies any cough during this time.  She notes occasional shortness of breath is transient in nature.  Past Medical History:  Diagnosis Date  . ANA POSITIVE 02/09/2010  . Anal warts 11/09/2012  . Anxiety   . Arthritis    "my bones ache all the time" (06/27/2014)  . Cellulitis 06/27/2014   medial proximal left thigh extending to the perineium  . Chronic bronchitis ([place])    "get it changing of the seasons; sometimes q yr; sometimes not"   . Complication of anesthesia    sats drop post op  . COPD (chronic obstructive pulmonary disease) ([place])   . 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 Diabetic peripheral neuropathy ([place]) since <2005   "hands and feet"  . Dizziness    Multiple falls at home  . Fibromyalgia   . Graves' disease    "they nuked it twice"  . RXVQMGQQ(761.9)    "monthly" (06/27/2014)  . Heart murmur    as a child-never had echo to evaluate  . Hyperlipidemia   . Hypertension   . Hypothyroidism   . Sickle cell trait ([place])   . Trimalleolar fracture of left ankle 11/21/2014  . Type 2 diabetes mellitus ([place])   . Wears glasses    Review of Systems:  negative other than those stated in HPI  Physical Exam:  Vitals:   04/26/19 1055  BP: (!) 173/74  Pulse: 96  Temp: (!) 100.9 F (38.3 C)  TempSrc: Oral  SpO2: 100%  Height: 5\' 2"  (1.575 m)     GENERAL: Chronically ill-appearing, sitting in wheelchair HEENT: no conjunctival injection. Nares patent.  CARDIAC: heart regular rate and rhythm, no peripheral edema appreciated PULMONARY: Crackles appreciated along the left base. ABDOMEN: bowel sounds active.  Tender to palpation SKIN: no rash or lesion on limited exam NEURO: Full neuro exam unable to be performed due to patient's inability to participate. Musculoskeletal: No deformities.  Patient has significant tenderness diffusely.  Patient is unable to move her legs.  Patient expresses inability of fine motor movements in her upper extremities.    Assessment & Plan:   See Encounters Tab for problem based charting.  Pertinent labs & imaging results that were available during my care of the patient were reviewed by me and considered in my medical decision making  Patient is in agreement with the plan and endorses no further questions at this time.  Patient seen with Dr. Marcille Buffy, MD Internal Medicine Resident-PGY1 04/26/19

## 2019-04-26 NOTE — Addendum Note (Signed)
Addended by: Truddie Crumble on: 04/26/2019 01:26 PM   Modules accepted: Orders

## 2019-04-26 NOTE — Addendum Note (Signed)
Addended by: Mitzi Hansen on: 04/26/2019 01:38 PM   Modules accepted: Orders

## 2019-04-26 NOTE — Assessment & Plan Note (Addendum)
This is a 58 year old female who presents to the clinic today for a 3-week history of progressive weakness.  Prior to this time, the patient notes that she was able to walk up stairs and go outside without problem.  Patient presents today in the wheelchair and is unable to get up.  She also endorses increasing pain over this time.  Patient initially presented with her daughter in the clinic here today however due to what appears to be some family dynamics, the daughter walked out.  The daughter has been caring for the patient over these past 3 weeks however the patient lives independently.  Physical exam is limited due to patient's cooperation due to pain.  Plan: Due to fever, weakness and likely dehydration, patient will be directly admitted to the hospital to the internal medicine teaching service.  CBC, CMP, chest x-ray and COVID test ordered in clinic.

## 2019-04-26 NOTE — Progress Notes (Signed)
Internal Medicine Clinic Attending  I saw and evaluated the patient.  I personally confirmed the key portions of the history and exam documented by Dr. Christian   and I reviewed pertinent patient test results.  The assessment, diagnosis, and plan were formulated together and I agree with the documentation in the resident's note.  

## 2019-04-26 NOTE — Progress Notes (Signed)
Report called to [place] on [address].  Patient remains alert to time and place.  Transported via wheel chair to [facility].  Sander Nephew, RN 04/26/2019. 210 PM.

## 2019-04-26 NOTE — H&P (Addendum)
Date: 04/26/2019               Patient Name:  Kayla [name] MRN: [id number]  DOB: [birth year] Age / Sex: 58 y.o., female   PCP: Asencion Noble, MD         Medical Service: Internal Medicine Teaching Service         Attending Physician: Dr. Rebeca Alert Raynaldo Opitz, MD    First Contact: Dr. Sheppard Coil Pager: [phone]  Second Contact: Dr. Trilby Drummer Pager: [phone]       After Hours (After 5p/  First Contact Pager: [phone]  weekends / holidays): Second Contact Pager: [phone]   Chief Complaint: Weakness  History of Present Illness: Kayla [name] is a 58 year old lady with a history of HTN, T2DM, hypothyroidism, CKD stage IV, sickle cell trait, MDD, and fibromyalgia who presented as a direct admission from the outpatient clinic for presumed blood loss anemia.   The patient initially presented to the outpatient clinic for an evaluation of malaise and generalized weakness that has progressively worsened over the last 2 months. Prior to this weakness, the patient was working with PT to rehabilitate an injured L ankle that she had previously broken in 2016. She was at a point where she was able to walk up and down up to 6 flights of stairs with ease. Subsequently the patient started experiencing increased generalized pain and weakness that has significantly decreased her functionality.  She started becoming fatigued with doing simple tasks such as walking to the mailbox. She eventually started using a cane to ambulate. The weakness and pain has progressed to a point to where she is in a wheelchair now.  She says that she is unable to get up from the wheelchair and complete her ADLs and IADLs. The patient also notes that she is noticed black tarry stools that have correlated with her increased pain and generalized weakness. She notes that her weakness is so profound, she is now using the bathroom on herself. She feels incontinent of her bowel and bladder and also feels too weak to get up and go to the bathroom  when she feels like she needs to use it.  For her fibromyalgia, the patient had taken gabapentin and duloxetine in the past, however she stopped taking this a long time ago because it made her "space out" for a week, and she did not like how it made her feel. She has managed her pain with as needed Aleve, which she took last week but stopped taking that as well.  The patient adamantly3 explains she is not a "druggie" and does not like to take medications.   In the clinic a CBC, CMP were ordered and was significant for Hgb of 4.7. Subsequently, TSH, Iron, TIBC, Ferratin, reticulocyte count, type & screen were then ordered.   Meds: No outpatient medications have been marked as taking for the 04/26/19 encounter [facility] Encounter).   Allergies: Allergies as of 04/26/2019 - Review Complete 11/30/2018  Allergen Reaction Noted  . Ace inhibitors  07/31/2006  . Percocet [oxycodone-acetaminophen] Itching 06/27/2014  . Statins  03/05/2013  . Latex Rash 10/21/2016  . Sulfamethoxazole Rash 06/27/2014  . Sulfites Rash 03/04/2012   Past Medical History:  Diagnosis Date  . ANA POSITIVE 02/09/2010  . Anal warts 11/09/2012  . Anxiety   . Arthritis    "my bones ache all the time" (06/27/2014)  . Cellulitis 06/27/2014   medial proximal left thigh extending to the perineium  . Chronic bronchitis ([place])    "  get it changing of the seasons; sometimes q yr; sometimes not"   . Complication of anesthesia    sats drop post op  . COPD (chronic obstructive pulmonary disease) ([place])   . 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 Diabetic peripheral neuropathy ([place]) since <2005   "hands and feet"  . Dizziness    Multiple falls at home  . Fibromyalgia   . Graves' disease    "they nuked it twice"  . LFYBOFBP(102.5)    "monthly" (06/27/2014)  . Heart murmur    as a child-never had echo to evaluate  . Hyperlipidemia   . Hypertension   . Hypothyroidism   . Sickle cell trait ([place])   .  Trimalleolar fracture of left ankle 11/21/2014  . Type 2 diabetes mellitus ([place])   . Wears glasses    Family History:  Family History  Problem Relation Age of Onset  . Emphysema Mother   . Cancer Mother        unknown  . Hypertension Mother   . Cancer Father        pancreatic   . Diabetes Father   . Hypertension Father   . Down syndrome Brother   . Cancer Maternal Aunt        cancer  . Cancer Other        breast cancer aunt  . Stroke Sister   . Diabetes Sister   . Colon cancer Neg Hx   . Stomach cancer Neg Hx    Social History:  Pt denies any alcohol, tobacco, or illicit drug use. Lives at home by herself. Used to be a respiratory therapist at [employer].   Review of Systems: A complete ROS was negative except as per HPI.   Physical Exam:  Physical Exam Constitutional:      General: She is in acute distress.     Appearance: She is ill-appearing. She is not diaphoretic.  HENT:     Head: Normocephalic and atraumatic.  Eyes:     General: No scleral icterus.       Right eye: No discharge.        Left eye: No discharge.     Extraocular Movements: Extraocular movements intact.     Comments: deferred checking for pallor, given how sensitive to touch patient was   Cardiovascular:     Rate and Rhythm: Normal rate and regular rhythm.     Pulses: Normal pulses.     Heart sounds: Normal heart sounds. No murmur. No friction rub. No gallop.   Pulmonary:     Effort: Pulmonary effort is normal. No respiratory distress.     Breath sounds: Normal breath sounds. No wheezing or rales.  Abdominal:     General: Abdomen is flat. Bowel sounds are normal.     Comments: deffered abdominal palpation, due to sensitivity to touch   Musculoskeletal:     Right lower leg: No edema.     Left lower leg: No edema.     Comments: Moves bilateral upper extremities antigravity with ease.  Moves bilateral lower extremities antigravity with difficulty secondary to pain.  Neurological:     Mental  Status: She is alert and oriented to person, place, and time.     Comments: Diffuse allodynia to any form of light touch  Psychiatric:     Comments: Anxious, untrusting of providers at first but appeared to become more comfortable as the visit went forward     EKG:  ordered, pending  CXR: ordered, pending   Assessment & Plan by Problem:  Active Problems:   * No active hospital problems. *  In summary, Kayla [name] is a 58 year old lady with a history of HTN, T2DM, hypothyroidism, CKD stage IV, sickle cell trait, MDD, and fibromyalgia who presented to the hospital as a direct admission from outpatient clinic for presumed blood loss anemia.  She presented with increased fatigue, generalized weakness and pain due to her fibromyalgia in addition to having black tarry stools over the last several weeks to months. In the clinic, her CBC was notable for a low hemoglobin of 4.7 and leukocytosis of 18.3. Her presentation is consistent with symptomatic anemia, infectious etiologies are unclear at the moment.   #Symptomatic Anemia of Unclear Etiology: At her outpatient visit her hemoglobin was 4.7.  Baseline around 8.0.  The patient has noted black tarry stools over the last several weeks that have correlated with her increased fatigue, generalized weakness, and increased pain. This is concerning for a blood from a GI source. - Type & screen - Transfuse, ordered 3U of pRBC - Iron studies (Iron, TIBC, Ferritin, reticulocyte count)   - Fecal occult blood card ordered - LDH - Folate, B12 - UA - Telemetry  - Consult GI for possible endoscopy/coloncsocpy to search for the source of bleeding once stabilized   #Fever #Leukocytosis: Pt had white count of 18.3 in the clinic. She also had a mild fever of 100.9 in office but resolved once she was admitted (98.9). Infectious source is unknown/undetermined at this time. - trend daily CBC - consider abx if the patient spikes fever again - CXR  ordered  #Depression #Allodynia  #Fibromyalgia: The patient has been prescribed gabapentin 600 mg QID and duloxetine 60 mg daily in the past, however the patient says she is does not take this currently.  It makes her feel spaced out and she forgets what happened the week prior. She is very reluctant to opoid use for pain management. On exam the slightest brush of her clothes or skin produces large amounts of pain. - Tylenol 650 mg q6hr PRN   #CKD stage IV #AKI: Cr. 6.09 on admission, baseline around 3.0. Injury likely due to volume depletion and blood loss. The patient says she is okay with going on dialysis short term if it will be only used temorparily, but is not interested in being on dialysis long term for ESRD. - IVF, NS 10 mL/hr infusion with transfusion  - Will start 100 mL/hr afterward blood transfusion  - Monitor with daily BMP  #HTN: BP was elevated at 173/74 at her clinic visit today prior to admission. She is prescribed amlodipine 5 mg daily as well as lasix 40 mg daily. - d/c Amlodipine and Lasix  #T2DM: T2DM with severe peripheral neuropathy listed in chart, however last A1c 4.8 in March 2020. Was as high as 9.3 in May 2015.  - Lantus 10U nightly  - SSI sensitive + HS correction   #Hypothyroidism: Last TSH was highly elevated 196.14 at previous outpatient visit. It is unclear if the patient is taking her thyroid medication. She was prescribed Synthroid 150 mg daily in 02/26/2019.  - Continue home Levothyroxine 150 mg  - Ordered TSH  #FEN/GI #Hyponatremia: 132 on admission #Hyperkalemia: 5.4 on admission  #Hypoalbuminemia: 2.2 on admission   #Hyperclacemia: Corrected level mildy elevated 10.7 - Clear liquid diet - Zofran 4 mg injections q6hr PRN   - EKG ordered  #Code status: Pt stated she is  okay with CPR, but does not want to be put an ventilator under any circumstance  Dispo: Admit patient to Inpatient with expected length of stay greater than 2  midnights.  Signed: Earlene Plater, MD Internal Medicine, PGY1 Pager: [phone]  04/26/2019,4:27 PM

## 2019-04-27 ENCOUNTER — Other Ambulatory Visit: Payer: Self-pay | Admitting: Internal Medicine

## 2019-04-27 ENCOUNTER — Telehealth: Payer: Self-pay | Admitting: Internal Medicine

## 2019-04-27 ENCOUNTER — Other Ambulatory Visit: Payer: Self-pay

## 2019-04-27 DIAGNOSIS — R195 Other fecal abnormalities: Secondary | ICD-10-CM

## 2019-04-27 DIAGNOSIS — D649 Anemia, unspecified: Secondary | ICD-10-CM

## 2019-04-27 DIAGNOSIS — Z659 Problem related to unspecified psychosocial circumstances: Secondary | ICD-10-CM

## 2019-04-27 LAB — URINALYSIS, COMPLETE (UACMP) WITH MICROSCOPIC
Bilirubin Urine: NEGATIVE
Glucose, UA: 150 mg/dL — AB
Ketones, ur: NEGATIVE mg/dL
Leukocytes,Ua: NEGATIVE
Nitrite: NEGATIVE
Protein, ur: 100 mg/dL — AB
Specific Gravity, Urine: 1.013 (ref 1.005–1.030)
pH: 5 (ref 5.0–8.0)

## 2019-04-27 LAB — GLUCOSE, CAPILLARY
Glucose-Capillary: 161 mg/dL — ABNORMAL HIGH (ref 70–99)
Glucose-Capillary: 107 mg/dL — ABNORMAL HIGH (ref 70–99)
Glucose-Capillary: 97 mg/dL (ref 70–99)
Glucose-Capillary: 93 mg/dL (ref 70–99)

## 2019-04-27 LAB — HEMOGLOBIN AND HEMATOCRIT, BLOOD
HCT: 27.5 % — ABNORMAL LOW (ref 36.0–46.0)
Hemoglobin: 9.4 g/dL — ABNORMAL LOW (ref 12.0–15.0)

## 2019-04-27 LAB — COMPREHENSIVE METABOLIC PANEL
ALT: 12 U/L (ref 0–44)
AST: 12 U/L — ABNORMAL LOW (ref 15–41)
Albumin: 2 g/dL — ABNORMAL LOW (ref 3.5–5.0)
Alkaline Phosphatase: 155 U/L — ABNORMAL HIGH (ref 38–126)
Anion gap: 9 (ref 5–15)
BUN: 64 mg/dL — ABNORMAL HIGH (ref 6–20)
CO2: 11 mmol/L — ABNORMAL LOW (ref 22–32)
Calcium: 8.5 mg/dL — ABNORMAL LOW (ref 8.9–10.3)
Chloride: 109 mmol/L (ref 98–111)
Creatinine, Ser: 5.43 mg/dL — ABNORMAL HIGH (ref 0.44–1.00)
GFR calc Af Amer: 9 mL/min — ABNORMAL LOW (ref >60)
GFR calc non Af Amer: 8 mL/min — ABNORMAL LOW (ref >60)
Glucose, Bld: 178 mg/dL — ABNORMAL HIGH (ref 70–99)
Potassium: 4.7 mmol/L (ref 3.5–5.1)
Sodium: 129 mmol/L — ABNORMAL LOW (ref 135–145)
Total Bilirubin: 1.3 mg/dL — ABNORMAL HIGH (ref 0.3–1.2)
Total Protein: 6.2 g/dL — ABNORMAL LOW (ref 6.5–8.1)

## 2019-04-27 MED ORDER — SODIUM CHLORIDE 0.9 % IV SOLN
510.0000 mg | INTRAVENOUS | Status: DC
  Administered 2019-04-27: 21:00:00 510 mg via INTRAVENOUS
  Filled 2019-04-27: qty 17

## 2019-04-27 NOTE — Progress Notes (Signed)
Received report on pt, pt is stable, rest in bed, blood transfusion second bag  Is giving, no reaction, continue monitor

## 2019-04-27 NOTE — Plan of Care (Signed)

## 2019-04-27 NOTE — Progress Notes (Signed)
PT Cancellation Note  Patient Details Name: Kayla [name] MRN: [id number] DOB: [birth year]   Cancelled Treatment:    Reason Eval/Treat Not Completed: Patient declined, no reason specified. States "No, go away." when entered room. Will re-attempt later if time allows.    [name] 04/27/2019, 9:26 AM

## 2019-04-27 NOTE — Telephone Encounter (Signed)
The answering service relayed that "A doctor from [facility] called regarding pt's possible GI bleed.  He requested to speak to a doctor.  His call back number is [phone]."

## 2019-04-27 NOTE — H&P (View-Only) (Signed)
Gastroenterology Consult: 1:00 PM 04/27/2019  LOS: 1 day    Referring Provider: Dr Rebeca Alert  Primary Care Physician:  Asencion Noble, MD Primary Gastroenterologist:  Dr. Carlean Purl     Reason for Consultation:  Anemia.  Dark stools.     HPI: Kayla [name] is a 58 y.o. female.  Stage 5 CKD.  Htn.  DM 2, not on meds.  Graves' disease, s/p ablation.  SS trait.  Fibromyalgia.  Peripheral neuropathy.  S/P C-section x 2, appendectomy. Bacterial vaginosis.  Hyperechoic lesion in the tail of the pancreas on abdominal ultrasound 05/2014, not seen on 04/2018 ultrasound which revealed GB polyp.     10/12/2012 colonoscopy.  Average risk screening study.  Nodular anal mucosa, biopsied (Papillary squamous mucosa with HPV changes).  Poor prep, colonic mucosa not well seen. 01/07/2013 colonoscopy. Normal study with adequate prep to IC valve: anal warts.  Chronic anemia.  Hemoglobins 7.5 on 02/26/2019.  8.6 in 07/2017.  MCV normal.   For several weeks patient has had fatigue.  Feels unsteady on her feet like her legs are going to fall out from under her.  She is had several falls at home.  Few months ago she was able to climb 6 flights of stairs without issue, in the past week she can hardly walk around the house.  After her falls she has been unable to get up.  For 2 weeks she has had pudding consistency, black stools.  Previously used to have occasional scant blood per rectum but none of that lately.  Her appetite is poor.  She is nauseous but has vomited only a couple of times in the last several weeks.  She lost from 160s to current 134 # over several months.   Patient not consistently taking her meds.  Says Synthroid makes her crazy but she does take it, not as prescribed.  Takes Naproxen rarely but used 4 pills a couple of weeks ago.  She  does not have medical insurance and so she has not gone back to see her kidney doctors in more than a year.  Hgb 4.7 >> 3 PRBC >> 9.4. Low iron, low TIBC, iron sats.  Ferritin 691.  B12 elevated, no folate level. No rectal exam or stool FOBT results. TSH markedly elevated at 64.8 No coags since 2013 Chronically elevated alk phos with otherwise normal LFTs or cysts.  Family history positive for end-stage renal disease, hypertension, cancer of unknown type, sickle cell trait, anemia in her mother  Past Medical History:  Diagnosis Date  . ANA POSITIVE 02/09/2010  . Anal warts 11/09/2012  . Anxiety   . Arthritis    "my bones ache all the time" (06/27/2014)  . Cellulitis 06/27/2014   medial proximal left thigh extending to the perineium  . Chronic bronchitis ([place])    "get it changing of the seasons; sometimes q yr; sometimes not"   . Complication of anesthesia    sats drop post op  . COPD (chronic obstructive pulmonary disease) ([place])   . 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 Diabetic peripheral neuropathy ([place]) since <2005   "hands and feet"  . Dizziness    Multiple falls at home  . Fibromyalgia   . Graves' disease    "they nuked it twice"  . NZVJKQAS(601.5)    "monthly" (06/27/2014)  . Heart murmur    as a child-never had echo to evaluate  . Hyperlipidemia   . Hypertension   . Hypothyroidism   . Sickle cell trait ([place])   . Trimalleolar fracture of left ankle 11/21/2014  . Type 2 diabetes mellitus ([place])   . Wears glasses     Past Surgical History:  Procedure Laterality Date  . ANKLE FRACTURE SURGERY Left 2008   "didn't put hardware in"  . APPENDECTOMY  1986  . [place]  . DILATION AND CURETTAGE OF UTERUS  1990's  . FRACTURE SURGERY    . INCISION AND DRAINAGE ABSCESS Left 07/01/2014   Procedure: INCISION AND DRAINAGE ABSCESS OF LEFT THIGH;  Surgeon: Georganna Skeans, MD;  Location: [facility];  Service: General;  Laterality: Left;  . ORIF  ANKLE FRACTURE Left 11/21/2014   Procedure: OPEN REDUCTION INTERNAL FIXATION (ORIF) TRI MALLEOLAR FRACTURE with retinacular repair;  Surgeon: Johnny Bridge, MD;  Location: [facility];  Service: Orthopedics;  Laterality: Left;    Prior to Admission medications   Medication Sig Start Date End Date Taking? Authorizing Provider  DULoxetine (CYMBALTA) 60 MG capsule Take 1 capsule (60 mg total) by mouth daily. 10/21/16  Yes Ahmed, Chesley Mires, MD  furosemide (LASIX) 40 MG tablet Take 1 tablet (40 mg total) by mouth daily. 11/30/18 11/30/19 Yes Asencion Noble, MD  Insulin Glargine (BASAGLAR KWIKPEN) 100 UNIT/ML SOPN Inject 0.2 mLs (20 Units total) into the skin daily. 03/24/18  Yes Forde Dandy, PharmD  ketoconazole (NIZORAL) 2 % shampoo Apply 1 application topically 2 (two) times a week. 03/01/19  Yes Lorella Nimrod, MD  levothyroxine (SYNTHROID) 150 MCG tablet Take 1 tablet (150 mcg total) by mouth daily before breakfast. 02/26/19  Yes Lorella Nimrod, MD  naproxen sodium (ALEVE) 220 MG tablet Take 440 mg by mouth as needed (pain).   Yes [provider]  topiramate (TOPAMAX) 25 MG capsule Take 25 mg by mouth 2 (two) times daily. Take 1 in the morning, 2 at night   Yes [provider]  amLODipine (NORVASC) 5 MG tablet Take 1 tablet (5 mg total) by mouth daily. Patient not taking: Reported on 04/27/2019 11/30/18   Asencion Noble, MD  Blood Glucose Monitoring Suppl (ACCU-CHEK AVIVA PLUS) W/DEVICE KIT 1 kit by Does not apply route as directed. 05/15/11   Bartholomew Crews, MD  fluconazole (DIFLUCAN) 150 MG tablet Take 1 tablet (150 mg total) by mouth daily. 02/26/19   Lorella Nimrod, MD  gabapentin (NEURONTIN) 300 MG capsule Take 2 capsules (600 mg total) by mouth 4 (four) times daily. [facility] program 10/30/16 10/30/17  Dellia Nims, MD  glucose blood (ACCU-CHEK AVIVA PLUS) test strip Use as instructed 10/21/16   Dellia Nims, MD  Lancet Devices [facility]) lancets Use as  instructed 10/21/16   Dellia Nims, MD  nystatin (MYCOSTATIN) 100000 UNIT/ML suspension Take 5 mLs (500,000 Units total) by mouth 4 (four) times daily. Patient not taking: Reported on 04/27/2019 05/04/18   Asencion Noble, MD  Triamcinolone Acetonide (TRIAMCINOLONE 0.1 % CREAM : EUCERIN) CREA Apply 1 application topically 2 (two) times daily. Patient not taking: Reported on 04/27/2019 12/15/17   Nedrud,  Larena Glassman, MD  Vitamin D, Ergocalciferol, (DRISDOL) 50000 units CAPS capsule Take 1 capsule (50,000 Units total) by mouth every 7 (seven) days. Patient not taking: Reported on 12/22/2017 10/25/16   Dellia Nims, MD    Scheduled Meds: . insulin aspart  0-5 Units Subcutaneous QHS  . insulin aspart  0-9 Units Subcutaneous TID WC  . insulin glargine  10 Units Subcutaneous QHS  . levothyroxine  150 mcg Oral QAC breakfast  . sodium chloride flush  10-40 mL Intracatheter Q12H  . sodium chloride flush  3 mL Intravenous Q12H   Infusions: . sodium chloride 100 mL/hr at 04/27/19 0607   PRN Meds: acetaminophen **OR** acetaminophen, ondansetron (ZOFRAN) IV, sodium chloride flush, traMADol   Allergies as of 04/26/2019 - Review Complete 11/30/2018  Allergen Reaction Noted  . Ace inhibitors  07/31/2006  . Percocet [oxycodone-acetaminophen] Itching 06/27/2014  . Statins  03/05/2013  . Latex Rash 10/21/2016  . Sulfamethoxazole Rash 06/27/2014  . Sulfites Rash 03/04/2012    Family History  Problem Relation Age of Onset  . Emphysema Mother   . Cancer Mother        unknown  . Hypertension Mother   . Cancer Father        pancreatic   . Diabetes Father   . Hypertension Father   . Down syndrome Brother   . Cancer Maternal Aunt        cancer  . Cancer Other        breast cancer aunt  . Stroke Sister   . Diabetes Sister   . Colon cancer Neg Hx   . Stomach cancer Neg Hx     Social History   Socioeconomic History  . Marital status: Single    Spouse name: Not on file  . Number of children:  Not on file  . Years of education: 89  . Highest education level: Not on file  Occupational History    Employer: UNEMPLOYED  Social Needs  . Financial resource strain: Not on file  . Food insecurity    Worry: Not on file    Inability: Not on file  . Transportation needs    Medical: Not on file    Non-medical: Not on file  Tobacco Use  . Smoking status: Former Smoker    Packs/day: 0.75    Years: 5.00    Pack years: 3.75    Types: Cigarettes    Quit date: 09/30/1990    Years since quitting: 28.5  . Smokeless tobacco: Never Used  Substance and Sexual Activity  . Alcohol use: No    Alcohol/week: 0.0 standard drinks  . Drug use: No  . Sexual activity: Not Currently  Lifestyle  . Physical activity    Days per week: Not on file    Minutes per session: Not on file  . Stress: Not on file  Relationships  . Social Herbalist on phone: Not on file    Gets together: Not on file    Attends religious service: Not on file    Active member of club or organization: Not on file    Attends meetings of clubs or organizations: Not on file    Relationship status: Not on file  . Intimate partner violence    Fear of current or ex partner: Not on file    Emotionally abused: Not on file    Physically abused: Not on file    Forced sexual activity: Not on file  Other Topics Concern  . Not  on file  Social History Narrative   Lives in [place], alone. Looking for a job. Has a son in town who checks in on her.     REVIEW OF SYSTEMS: Constitutional: Weakness. ENT:  No nose bleeds Pulm: Some shortness of breath which is not as severe as her weakness.  No cough. CV:  No palpitations, no LE edema.  No chest pain. GU:  No hematuria, no frequency GI: Per HPI. Heme: Denies unusual or aggressive bleeding other than the black stools mentioned above Transfusions: As per HPI. Neuro:  No headaches, no peripheral tingling or numbness Derm:  No itching, no rash or sores.  Endocrine:  No  sweats or chills.  No polyuria or dysuria Immunization: Vaccinated for hepatitis B 2005 -2006 Travel:  None beyond local counties in last few months.    PHYSICAL EXAM: Vital signs in last 24 hours: Vitals:   04/27/19 0417 04/27/19 1101  BP: (!) 150/69 (!) 145/68  Pulse: 70 70  Resp: 18 18  Temp: 98.4 F (36.9 C) 98 F (36.7 C)  SpO2: 99% 99%   Wt Readings from Last 3 Encounters:  04/27/19 61 kg  02/26/19 65.4 kg  11/30/18 66.4 kg    General: Moderately ill-appearing, alert, comfortable. Head: No facial asymmetry. Eyes: Exophthalmos. Ears: Not hard of hearing Nose: No discharge Mouth: Mucosa moist, pink, clear.  Tongue midline.  Fair dentition. Neck: No JVD, no masses. Lungs: Clear bilaterally no labored breathing or cough. Heart: RRR.  No MRG.  S1, S2 present. Abdomen: Soft.  Not tender or distended.  No HSM, masses, bruits, hernias..   Rectal: DRE performed, rectum smooth.  No blood and no stool. Musc/Skeltl: No joint redness or swelling.  Post surgical deformity of left lateral ankle. Extremities: CCE. Neurologic: Alert.  Oriented x3.  Moves all 4 limbs, strength not tested.  Grossly no weakness.  No tremors. Skin: No obvious rash or sores. Nodes: No cervical adenopathy. Psych: Clipped speech.  Borderline annoyed but cooperative.  Intake/Output from previous day: 07/27 0701 - 07/28 0700 In: 2015.9 [P.O.:240; I.V.:836.9; Blood:939] Out: 470 [JGGEZ:662] Intake/Output this shift: No intake/output data recorded.  LAB RESULTS: Recent Labs    04/26/19 1212 04/27/19 0745  WBC 18.3*  --   HGB 4.7* 9.4*  HCT 14.6* 27.5*  PLT 217  --    BMET Lab Results  Component Value Date   NA 129 (L) 04/27/2019   NA 132 (L) 04/26/2019   NA 138 02/26/2019   K 4.7 04/27/2019   K 5.4 (H) 04/26/2019   K 4.7 02/26/2019   CL 109 04/27/2019   CL 109 04/26/2019   CL 116 (H) 02/26/2019   CO2 11 (L) 04/27/2019   CO2 11 (L) 04/26/2019   CO2 14 (L) 02/26/2019   GLUCOSE 178 (H)  04/27/2019   GLUCOSE 252 (H) 04/26/2019   GLUCOSE 129 (H) 02/26/2019   BUN 64 (H) 04/27/2019   BUN 70 (H) 04/26/2019   BUN 52 (H) 02/26/2019   CREATININE 5.43 (H) 04/27/2019   CREATININE 6.09 (H) 04/26/2019   CREATININE 4.59 (H) 02/26/2019   CALCIUM 8.5 (L) 04/27/2019   CALCIUM 9.3 04/26/2019   CALCIUM 8.5 (L) 02/26/2019   LFT Recent Labs    04/26/19 1212 04/27/19 0433  PROT 6.6 6.2*  ALBUMIN 2.2* 2.0*  AST 13* 12*  ALT 12 12  ALKPHOS 153* 155*  BILITOT 1.1 1.3*   PT/INR Lab Results  Component Value Date   INR 1.00 08/17/2012   Hepatitis Panel  No results for input(s): HEPBSAG, HCVAB, HEPAIGM, HEPBIGM in the last 72 hours. C-Diff No components found for: CDIFF Lipase     Component Value Date/Time   LIPASE 67 (H) 08/17/2012 2108    Drugs of Abuse     Component Value Date/Time   LABOPIA NONE DETECTED 08/18/2012 0026   COCAINSCRNUR NONE DETECTED 08/18/2012 0026   LABBENZ NONE DETECTED 08/18/2012 0026   AMPHETMU NONE DETECTED 08/18/2012 0026   THCU NONE DETECTED 08/18/2012 0026   LABBARB NONE DETECTED 08/18/2012 0026     RADIOLOGY STUDIES: No results found.  IMPRESSION:   *   Normocytic anemia.  . Patient has numerous reasons to be anemic including late stage kidney disease, poorly controlled hypothyroidism, sickle cell trait.  Black, unformed stools suggestive of GI bleed but Hemoccult positivity not yet established. The pattern of iron studies consistent with anemia of chronic disease.  *    Stage 5 CKD.  Given her inability to arrange for outpatient renal follow-up, would strongly encourage inpatient renal consult.    PLAN:     *    EGD arranged for mid morning tomorrow.  Patient agreeable.  She can eat tonight but n.p.o. after midnight.   Azucena Freed  04/27/2019, 1:00 PM Phone [phone]

## 2019-04-27 NOTE — Progress Notes (Signed)
Subjective: Pt seen on rounds this AM. Says she feels much better than she did yesterday. Pain is well controlled with Tramadol and Tylenol. We discussed the need to find out where she is bleeding from, and that we want to get the GI doctors to come see her. She is amendable to this plan. She states that the last time she ate was last night. Breakfast was at the bedside, but the patient had not had any food yet. We asked her not to eat and subsequently placed an order to be NPO.  Objective:  Vital signs in last 24 hours: Vitals:   04/27/19 0102 04/27/19 0143 04/27/19 0417 04/27/19 0500  BP: (!) 157/74 (!) 151/68 (!) 150/69   Pulse: 82 78 70   Resp: 20 20 18    Temp: 99.8 F (37.7 C) 99.6 F (37.6 C) 98.4 F (36.9 C)   TempSrc: Oral Oral Oral   SpO2: 99% 99% 99%   Weight:    61 kg   Physical Exam Constitutional:      General: She is not in acute distress.    Appearance: Normal appearance. She is not ill-appearing or toxic-appearing.  HENT:     Head: Normocephalic and atraumatic.  Eyes:     General: No scleral icterus.       Right eye: No discharge.        Left eye: No discharge.     Extraocular Movements: Extraocular movements intact.     Comments: Pale conjunctiva   Cardiovascular:     Rate and Rhythm: Normal rate and regular rhythm.     Pulses: Normal pulses.     Heart sounds: Normal heart sounds. No murmur. No friction rub. No gallop.   Pulmonary:     Effort: Pulmonary effort is normal. No respiratory distress.     Breath sounds: Normal breath sounds. No wheezing or rales.  Abdominal:     General: Abdomen is flat. Bowel sounds are normal. There is no distension.     Palpations: Abdomen is soft.     Tenderness: There is no abdominal tenderness. There is no guarding.  Musculoskeletal: Normal range of motion.     Right lower leg: No edema.     Left lower leg: No edema.     Comments: Spontaneously moves all extremities in bed antigravity without hesitation.  Skin:  General: Skin is warm.  Neurological:     General: No focal deficit present.     Mental Status: She is alert and oriented to person, place, and time. Mental status is at baseline.  Psychiatric:        Mood and Affect: Mood normal.     Comments: Pleasant affect today    Assessment/Plan:  Active Problems:   Symptomatic anemia  In summary, Ms. Raj is a 58 year old lady with a history of HTN, T2DM, hypothyroidism, CKD stage IV, sickle cell trait, MDD, and fibromyalgia who presented to the hospital as a direct admission from outpatient clinic for presumed blood loss anemia.  She presented with increased fatigue, generalized weakness and pain due to her fibromyalgia in addition to having black tarry stools over the last several weeks to months. In the clinic, her CBC was notable for a low hemoglobin of 4.7 and leukocytosis of 18.3. Her presentation was consistent with symptomatic anemia, infectious etiologies are unclear at the moment. The patient says se feels much better today.  #Symptomatic Anemia of Unclear Etiology: Hemoglobin was 4.7 on admission. Baseline around 8.0. S/p pRBC 3U. Now 9.4.  The patient has noted black tarry stools over the last several weeks that have correlated with her increased fatigue, generalized weakness, and increased pain. This is concerning for a blood from a GI source. Other notable labs such as serum ferritin, iron, TIBC, reticulocyte count, LDH were also obtained.  Iron studies consistent with iron deficiency anemia with the exception of an elevated ferritin (691). Bilirubin and LDH was normal, ruling out any hemolytic process. Reticulocyte index calculated and was severely low (1.8), underscoring a production problem. This makes sense given the patient's advanced kidney disease. - Consult GI for possible endoscopy/colonoscopy to look for source of bleeding.  - Telemetry  - Daily CBC  #Fever #Leukocytosis: Pt had white count of 18.3 in the clinic. She also had a  mild fever of 100.9 in office but resolved once she was admitted (98.9). Spiked fever again overnight to 100.6 but resolved again. Infectious source is unknown/undetermined at this time. UA was unremarkable. Pt denies any respiratory symptoms at this time, so there is a low suspicion for a pulmonary process. - Daily CBC - consider abx if the patient develops more convincing signs of infection  - CXR ordered, but has not been performed yet  #Depression #Allodynia  #Fibromyalgia: The patient has been prescribed gabapentin 600 mg QID and duloxetine 60 mg daily in the past, however the patient says she is does not take this currently.  It makes her feel spaced out and she forgets what happened the week prior. She is very reluctant to opoid use for pain management. On initial exam the slightest brush of her clothes or skin produces large amounts of pain.  Today the patient is moving around in bed freely and has much less pain. She is happy with the current pain regimen. - Tramadol 50 mg q8hr PRN  - Tylenol 650 mg q6hr PRN  #CKD stage IV #AKI: Cr. 6.09 on admission, now 5.43, baseline around 3.0. Injury likely due to volume depletion and blood loss. The patient says she is okay with going on dialysis short term if it will be only used temorparily, but is not interested in being on dialysis long term for ESRD. - Continue NS 100 mL/hr  - Monitor with daily BMP  #HTN: BP was elevated at 173/74 at her clinic visit today prior to admission. She is prescribed amlodipine 5 mg daily as well as lasix 40 mg daily. - d/c Amlodipine and Lasix  #T2DM: T2DM with severe peripheral neuropathy listed in chart, however last A1c 4.8 in March 2020. Was as high as 9.3 in May 2015.  - Lantus 10U nightly  - SSI sensitive + HS correction   #Hypothyroidism: Last TSH was highly elevated 196.14 at previous outpatient visit and 64.8 on admission She was prescribed Synthroid 150 mg daily on 02/26/2019.  - Continue home  Levothyroxine 150 mg   #FEN/GI #Hyponatremia: 132 on admission, 129 today. #Hyperkalemia: 5.4 on admission. Resolved to 4.7 today. #Hypoalbuminemia: 2.2 on admission, 2.0 toady. #Hyperclacemia: Corrected level mildy elevated 10.7 on admission, normal at 10.1 today. - Thin liquid diet for now, per GI.  - Zofran 4 mg injections q6hr PRN   - EKG today  #Code status: Pt stated she is okay with CPR, but does not want to be put an ventilator under any circumstance  Dispo: Admit patient to Inpatient with expected length of stay greater than 2 midnights.   Earlene Plater, MD Internal Medicine, PGY1 Pager: [phone]  04/27/2019,10:58 AM

## 2019-04-27 NOTE — Evaluation (Signed)
Physical Therapy Evaluation Patient Details Name: Kayla [name] MRN: [id number] DOB: Apr 15, 1961 Today's Date: 04/27/2019   History of Present Illness  Patient is a 58 year old female admitted with weakness. Diagnosed with symtomatic anemia hgb 4.9 when admitted. PMH to include fibromyalgia, CKD, sickle cell, DM, hypothyroidism, HTN.  Clinical Impression  Patient received in bed, reports she needs to go to the bathroom, when suggested she get up to the the [facility] she adamantly refused. Requests the bedpan. Patient demonstrates mod independence with rolling and scooting up in bed. Performed LE strengthening exercises without assist. She will benefit from PT follow up acutely to improve strength, transfers and improved functional mobility for return home.     Follow Up Recommendations Home health PT    Equipment Recommendations  Other (comment)(TBD)    Recommendations for Other Services       Precautions / Restrictions Precautions Precautions: Fall Restrictions Weight Bearing Restrictions: No      Mobility  Bed Mobility Overal bed mobility: Modified Independent             General bed mobility comments: performed rolling in bed and scooting with use of bed rails only  Transfers                 General transfer comment: patient refused to transfer this visit.  Ambulation/Gait             General Gait Details: refused to try  Stairs            Wheelchair Mobility    Modified Rankin (Stroke Patients Only)       Balance                                             Pertinent Vitals/Pain Pain Assessment: Faces Faces Pain Scale: Hurts a little bit Pain Location: B hands, back, left shoulder Pain Descriptors / Indicators: Aching;Discomfort;Grimacing Pain Intervention(s): Monitored during session;Repositioned    Home Living Family/patient expects to be discharged to:: Private residence Living Arrangements: Alone Available Help at  Discharge: Family granddaughter comes by Type of Home: Apartment Home Access: Stairs to enter Entrance Stairs-Rails: Right Entrance Stairs-Number of Steps: 3 Home Layout: One level Home Equipment: Wheelchair - manual;Hand held shower head;Shower seat;Walker - 2 wheels;Cane - single point      Prior Function Level of Independence: Independent with assistive device(s)         Comments: patient reports she is no longer driving, was unable to walk most recently due to severe weakness. Prior to that she was going to outpatient PT since ankle fracture in 2016.     Hand Dominance        Extremity/Trunk Assessment   Upper Extremity Assessment Upper Extremity Assessment: Overall WFL for tasks assessed    Lower Extremity Assessment Lower Extremity Assessment: Overall WFL for tasks assessed       Communication   Communication: No difficulties  Cognition Arousal/Alertness: Awake/alert Behavior During Therapy: WFL for tasks assessed/performed Overall Cognitive Status: Within Functional Limits for tasks assessed                                 General Comments: knows what she will and will not do.      General Comments      Exercises Other Exercises Other Exercises: supine  B LE strengthening exercises: SLR, Hip abd/add, heel slides x 10 reps ea.   Assessment/Plan    PT Assessment Patient needs continued PT services  PT Problem List Decreased strength;Decreased mobility;Decreased activity tolerance;Pain       PT Treatment Interventions DME instruction;Therapeutic activities;Gait training;Therapeutic exercise;Functional mobility training;Stair training;Patient/family education    PT Goals (Current goals can be found in the Care Plan section)  Acute Rehab PT Goals Patient Stated Goal: to go home PT Goal Formulation: With patient Time For Goal Achievement: 05/04/19 Potential to Achieve Goals: Good    Frequency Min 3X/week   Barriers to discharge  Decreased caregiver support      Co-evaluation               AM-PAC PT "6 Clicks" Mobility  Outcome Measure Help needed turning from your back to your side while in a flat bed without using bedrails?: A Little Help needed moving from lying on your back to sitting on the side of a flat bed without using bedrails?: A Little Help needed moving to and from a bed to a chair (including a wheelchair)?: A Little Help needed standing up from a chair using your arms (e.g., wheelchair or bedside chair)?: A Little Help needed to walk in hospital room?: A Lot Help needed climbing 3-5 steps with a railing? : A Lot 6 Click Score: 16    End of Session   Activity Tolerance: Patient limited by lethargy Patient left: in bed;with bed alarm set;with call bell/phone within reach Nurse Communication: Mobility status PT Visit Diagnosis: Muscle weakness (generalized) (M62.81);Difficulty in walking, not elsewhere classified (R26.2);Pain Pain - part of body: Shoulder(back, hands)    Time: 1350-1410 PT Time Calculation (min) (ACUTE ONLY): 20 min   Charges:   PT Evaluation $PT Eval Low Complexity: 1 Low PT Treatments $Therapeutic Exercise: 8-22 mins        Analayah Brooke, PT, GCS 04/27/19,2:32 PM

## 2019-04-27 NOTE — Consult Note (Signed)
Reason for Consult: Acute kidney injury on chronic kidney disease stage IV Referring Physician: Lenice Pressman, MD ([facility])  HPI:  58 year old African-American woman with past medical history significant for chronic kidney disease stage IV (baseline creatinine suspected to be around 3.8-4.2) from underlying hypertension and diabetes mellitus.  She also has a history of sickle cell trait, fibromyalgia, diabetic neuropathy, chronic obstructive lung disease and hypothyroidism status post radiation treatment for Graves' disease.  She is followed by Dr. Jimmy Footman at [facility] and last saw him in November, 2019 and was since lost to follow-up.  She presented to the internal medicine clinic with 3 weeks of profound weakness/malaise rendering her to rely on a wheelchair for mobility.  She reported exertional dyspnea without any cough or sputum production and denies any hemoptysis.  She does report history of passing dark-colored stools preceding her current symptoms.  She denies any hematochezia or hematemesis.  She denies any dysuria, urgency, frequency, flank pain, fever or chills.  She reports infrequent use of NSAIDs-last used naproxen 440 mg 1 week ago.  Denies any joint swelling or skin rash.  She reports some preceding nausea and poor appetite with weight loss over the past several months.  She denies any dysgeusia or abnormal limb jerking movements.  She was discovered to have severe anemia with a hemoglobin of 4.7 and iron saturation of 7% with a ferritin of 691.  She was transfused overnight and today her hemoglobin is up to 9.4.  With regards to her renal function, she was noted to have her creatinine of 6.1 which has improved overnight to 5.4.  Past Medical History:  Diagnosis Date  . ANA POSITIVE 02/09/2010  . Anal warts 11/09/2012  . Anxiety   . Arthritis    "my bones ache all the time" (06/27/2014)  . Cellulitis 06/27/2014   medial proximal left thigh extending to the perineium   . Chronic bronchitis ([place])    "get it changing of the seasons; sometimes q yr; sometimes not"   . Complication of anesthesia    sats drop post op  . COPD (chronic obstructive pulmonary disease) ([place])   . 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 Diabetic peripheral neuropathy ([place]) since <2005   "hands and feet"  . Dizziness    Multiple falls at home  . Fibromyalgia   . Graves' disease    "they nuked it twice"  . GQQPYPPJ(093.2)    "monthly" (06/27/2014)  . Heart murmur    as a child-never had echo to evaluate  . Hyperlipidemia   . Hypertension   . Hypothyroidism   . Sickle cell trait ([place])   . Trimalleolar fracture of left ankle 11/21/2014  . Type 2 diabetes mellitus ([place])   . Wears glasses     Past Surgical History:  Procedure Laterality Date  . ANKLE FRACTURE SURGERY Left 2008   "didn't put hardware in"  . APPENDECTOMY  1986  . [place]  . DILATION AND CURETTAGE OF UTERUS  1990's  . FRACTURE SURGERY    . INCISION AND DRAINAGE ABSCESS Left 07/01/2014   Procedure: INCISION AND DRAINAGE ABSCESS OF LEFT THIGH;  Surgeon: Georganna Skeans, MD;  Location: [facility];  Service: General;  Laterality: Left;  . ORIF ANKLE FRACTURE Left 11/21/2014   Procedure: OPEN REDUCTION INTERNAL FIXATION (ORIF) TRI MALLEOLAR FRACTURE with retinacular repair;  Surgeon: Johnny Bridge, MD;  Location: [facility];  Service: Orthopedics;  Laterality:  Left;    Family History  Problem Relation Age of Onset  . Emphysema Mother   . Cancer Mother        unknown  . Hypertension Mother   . Cancer Father        pancreatic   . Diabetes Father   . Hypertension Father   . Down syndrome Brother   . Cancer Maternal Aunt        cancer  . Cancer Other        breast cancer aunt  . Stroke Sister   . Diabetes Sister   . Colon cancer Neg Hx   . Stomach cancer Neg Hx     Social History:  reports that she quit smoking about 28 years ago. Her smoking use  included cigarettes. She has a 3.75 pack-year smoking history. She has never used smokeless tobacco. She reports that she does not drink alcohol or use drugs.  Allergies:  Allergies  Allergen Reactions  . Ace Inhibitors     Dizziness, nausea  . Percocet [Oxycodone-Acetaminophen] Itching  . Statins   . Latex Rash  . Sulfamethoxazole Rash  . Sulfites Rash    Medications:  Scheduled: . insulin aspart  0-5 Units Subcutaneous QHS  . insulin aspart  0-9 Units Subcutaneous TID WC  . insulin glargine  10 Units Subcutaneous QHS  . levothyroxine  150 mcg Oral QAC breakfast  . sodium chloride flush  10-40 mL Intracatheter Q12H  . sodium chloride flush  3 mL Intravenous Q12H    BMP Latest Ref Rng & Units 04/27/2019 04/26/2019 02/26/2019  Glucose 70 - 99 mg/dL 178(H) 252(H) 129(H)  BUN 6 - 20 mg/dL 64(H) 70(H) 52(H)  Creatinine 0.44 - 1.00 mg/dL 5.43(H) 6.09(H) 4.59(H)  BUN/Creat Ratio 9 - 23 - - -  Sodium 135 - 145 mmol/L 129(L) 132(L) 138  Potassium 3.5 - 5.1 mmol/L 4.7 5.4(H) 4.7  Chloride 98 - 111 mmol/L 109 109 116(H)  CO2 22 - 32 mmol/L 11(L) 11(L) 14(L)  Calcium 8.9 - 10.3 mg/dL 8.5(L) 9.3 8.5(L)   CBC Latest Ref Rng & Units 04/27/2019 04/26/2019 02/26/2019  WBC 4.0 - 10.5 K/uL - 18.3(H) 5.9  Hemoglobin 12.0 - 15.0 g/dL 9.4(L) 4.7(LL) 7.5(L)  Hematocrit 36.0 - 46.0 % 27.5(L) 14.6(L) 22.6(L)  Platelets 150 - 400 K/uL - 217 138(L)   Urinalysis    Component Value Date/Time   COLORURINE YELLOW 04/27/2019 0507   APPEARANCEUR CLEAR 04/27/2019 0507   LABSPEC 1.013 04/27/2019 0507   PHURINE 5.0 04/27/2019 0507   GLUCOSEU 150 (A) 04/27/2019 0507   GLUCOSEU > 1000 mg/dL (A) 11/13/2009 1950   HGBUR SMALL (A) 04/27/2019 0507   HGBUR trace-lysed 06/21/2009 0925   BILIRUBINUR NEGATIVE 04/27/2019 0507   BILIRUBINUR Negative 02/26/2019 1042   KETONESUR NEGATIVE 04/27/2019 0507   PROTEINUR 100 (A) 04/27/2019 0507   UROBILINOGEN 0.2 02/26/2019 1042   UROBILINOGEN 1.0 06/27/2014 2234    NITRITE NEGATIVE 04/27/2019 0507   LEUKOCYTESUR NEGATIVE 04/27/2019 0507    Review of Systems  Constitutional: Positive for malaise/fatigue.  HENT: Negative.   Eyes: Negative.   Respiratory: Negative.   Cardiovascular: Positive for palpitations. Negative for chest pain, orthopnea, claudication and leg swelling.  Gastrointestinal: Positive for melena and nausea. Negative for abdominal pain, diarrhea and vomiting.       Reported as dark stool "blowouts"  Genitourinary: Negative.   Musculoskeletal: Positive for back pain and myalgias. Negative for joint pain.       Associated with fibromyalgia  Skin:  Negative.   Neurological: Positive for dizziness and headaches. Negative for sensory change and focal weakness.   Blood pressure (!) 154/63, pulse 84, temperature 98.8 F (37.1 C), temperature source Oral, resp. rate 18, weight 61 kg, last menstrual period 01/12/2014, SpO2 100 %. Physical Exam  Nursing note and vitals reviewed. Constitutional: She is oriented to person, place, and time. She appears well-developed and well-nourished. No distress.  HENT:  Head: Normocephalic and atraumatic.  Mouth/Throat: No oropharyngeal exudate.  Eyes: Pupils are equal, round, and reactive to light. Conjunctivae and EOM are normal. No scleral icterus.  Neck: Normal range of motion. Neck supple. No JVD present.  Cardiovascular: Normal rate and regular rhythm.  Murmur heard. Ejection systolic murmur 3/6  Respiratory: Effort normal and breath sounds normal. She has no wheezes. She has no rales.  GI: Soft. Bowel sounds are normal. There is no abdominal tenderness. There is no rebound and no guarding.  Musculoskeletal: Normal range of motion.        General: No edema.  Neurological: She is alert and oriented to person, place, and time.  Skin: Skin is warm and dry. No rash noted. No erythema.  Psychiatric: She has a normal mood and affect.    Assessment/Plan: 1.  Acute kidney injury on chronic kidney  disease stage IV: This appears to be hemodynamically mediated but I cannot entirely rule out gradual progression with what appears to be suboptimally controlled hypertension at baseline (patient admits to not taking certain medications because of "making her feel bad".  She is nearing her euvolemic state and I agree with time-limited intravenous fluids while restricting her oral fluid intake for fear of propagating hyponatremia.  Her initial urine sediment was not concerning for possible GN and I will send off for urine electrolytes.  Renal function appears to be improving and she does not have any indications for dialysis at this time but will need active discussions and evaluation/preparation for what is certainly imminent dialysis.  Continue to avoid nonsteroidal anti-inflammatory drugs/iodinated intravenous contrast and hypotension. 2.  Severe anemia: Symptomatic on presentation and improved with PRBC transfusion.  History indicates that she probably has ongoing GI loss with severe iron deficiency.  Awaiting endoscopic evaluation.  She likely has an underlying component of anemia of chronic kidney disease and will need ESA/iron therapy initiated here and established as an outpatient. 3.  Hypertension: Continue to monitor blood pressures with volume expansion and resume antihypertensive therapy with the exception of RAS blocker/diuretics at this time. 4.  Hyponatremia: Secondary to impaired water handling with acute kidney injury and unrestricted oral fluid intake.  Will modify the latter and restrict fluid intake at 1.2 L/day. 5.  Hypothyroidism: Appears uncontrolled and indeed may be contributing to her acute kidney injury.  Adjust levothyroxine dose. 6.  Fever/leukocytosis: Ongoing close evaluation with plans for additional work-up and management per primary service. Kamyiah Colantonio K. 04/27/2019, 4:22 PM

## 2019-04-27 NOTE — Consult Note (Signed)
[facility] Consult: 1:00 PM 04/27/2019  LOS: 1 day    Referring Provider: Dr Rebeca Alert  Primary Care Physician:  Asencion Noble, MD Primary Gastroenterologist:  Dr. Carlean Purl     Reason for Consultation:  Anemia.  Dark stools.     HPI: Kayla [name] is a 58 y.o. female.  Stage 5 CKD.  Htn.  DM 2, not on meds.  Graves' disease, s/p ablation.  SS trait.  Fibromyalgia.  Peripheral neuropathy.  S/P C-section x 2, appendectomy. Bacterial vaginosis.  Hyperechoic lesion in the tail of the pancreas on abdominal ultrasound 05/2014, not seen on 04/2018 ultrasound which revealed GB polyp.     10/12/2012 colonoscopy.  Average risk screening study.  Nodular anal mucosa, biopsied (Papillary squamous mucosa with HPV changes).  Poor prep, colonic mucosa not well seen. 01/07/2013 colonoscopy. Normal study with adequate prep to IC valve: anal warts.  Chronic anemia.  Hemoglobins 7.5 on 02/26/2019.  8.6 in 07/2017.  MCV normal.   For several weeks patient has had fatigue.  Feels unsteady on her feet like her legs are going to fall out from under her.  She is had several falls at home.  Few months ago she was able to climb 6 flights of stairs without issue, in the past week she can hardly walk around the house.  After her falls she has been unable to get up.  For 2 weeks she has had pudding consistency, black stools.  Previously used to have occasional scant blood per rectum but none of that lately.  Her appetite is poor.  She is nauseous but has vomited only a couple of times in the last several weeks.  She lost from 160s to current 134 # over several months.   Patient not consistently taking her meds.  Says Synthroid makes her crazy but she does take it, not as prescribed.  Takes Naproxen rarely but used 4 pills a couple of weeks ago.  She  does not have medical insurance and so she has not gone back to see her kidney doctors in more than a year.  Hgb 4.7 >> 3 PRBC >> 9.4. Low iron, low TIBC, iron sats.  Ferritin 691.  B12 elevated, no folate level. No rectal exam or stool FOBT results. TSH markedly elevated at 64.8 No coags since 2013 Chronically elevated alk phos with otherwise normal LFTs or cysts.  Family history positive for end-stage renal disease, hypertension, cancer of unknown type, sickle cell trait, anemia in her mother  Past Medical History:  Diagnosis Date  . ANA POSITIVE 02/09/2010  . Anal warts 11/09/2012  . Anxiety   . Arthritis    "my bones ache all the time" (06/27/2014)  . Cellulitis 06/27/2014   medial proximal left thigh extending to the perineium  . Chronic bronchitis ([place])    "get it changing of the seasons; sometimes q yr; sometimes not"   . Complication of anesthesia    sats drop post op  . COPD (chronic obstructive pulmonary disease) ([place])   . 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 Diabetic peripheral neuropathy ([place]) since <2005   "hands and feet"  . Dizziness    Multiple falls at home  . Fibromyalgia   . Graves' disease    "they nuked it twice"  . UKGURKYH(062.3)    "monthly" (06/27/2014)  . Heart murmur    as a child-never had echo to evaluate  . Hyperlipidemia   . Hypertension   . Hypothyroidism   . Sickle cell trait ([place])   . Trimalleolar fracture of left ankle 11/21/2014  . Type 2 diabetes mellitus ([place])   . Wears glasses     Past Surgical History:  Procedure Laterality Date  . ANKLE FRACTURE SURGERY Left 2008   "didn't put hardware in"  . APPENDECTOMY  1986  . [place]  . DILATION AND CURETTAGE OF UTERUS  1990's  . FRACTURE SURGERY    . INCISION AND DRAINAGE ABSCESS Left 07/01/2014   Procedure: INCISION AND DRAINAGE ABSCESS OF LEFT THIGH;  Surgeon: Georganna Skeans, MD;  Location: [facility];  Service: General;  Laterality: Left;  . ORIF  ANKLE FRACTURE Left 11/21/2014   Procedure: OPEN REDUCTION INTERNAL FIXATION (ORIF) TRI MALLEOLAR FRACTURE with retinacular repair;  Surgeon: Johnny Bridge, MD;  Location: [facility];  Service: Orthopedics;  Laterality: Left;    Prior to Admission medications   Medication Sig Start Date End Date Taking? Authorizing Provider  DULoxetine (CYMBALTA) 60 MG capsule Take 1 capsule (60 mg total) by mouth daily. 10/21/16  Yes Ahmed, Chesley Mires, MD  furosemide (LASIX) 40 MG tablet Take 1 tablet (40 mg total) by mouth daily. 11/30/18 11/30/19 Yes Asencion Noble, MD  Insulin Glargine (BASAGLAR KWIKPEN) 100 UNIT/ML SOPN Inject 0.2 mLs (20 Units total) into the skin daily. 03/24/18  Yes Forde Dandy, PharmD  ketoconazole (NIZORAL) 2 % shampoo Apply 1 application topically 2 (two) times a week. 03/01/19  Yes Lorella Nimrod, MD  levothyroxine (SYNTHROID) 150 MCG tablet Take 1 tablet (150 mcg total) by mouth daily before breakfast. 02/26/19  Yes Lorella Nimrod, MD  naproxen sodium (ALEVE) 220 MG tablet Take 440 mg by mouth as needed (pain).   Yes [provider]  topiramate (TOPAMAX) 25 MG capsule Take 25 mg by mouth 2 (two) times daily. Take 1 in the morning, 2 at night   Yes [provider]  amLODipine (NORVASC) 5 MG tablet Take 1 tablet (5 mg total) by mouth daily. Patient not taking: Reported on 04/27/2019 11/30/18   Asencion Noble, MD  Blood Glucose Monitoring Suppl (ACCU-CHEK AVIVA PLUS) W/DEVICE KIT 1 kit by Does not apply route as directed. 05/15/11   Bartholomew Crews, MD  fluconazole (DIFLUCAN) 150 MG tablet Take 1 tablet (150 mg total) by mouth daily. 02/26/19   Lorella Nimrod, MD  gabapentin (NEURONTIN) 300 MG capsule Take 2 capsules (600 mg total) by mouth 4 (four) times daily. [facility] program 10/30/16 10/30/17  Dellia Nims, MD  glucose blood (ACCU-CHEK AVIVA PLUS) test strip Use as instructed 10/21/16   Dellia Nims, MD  Lancet Devices [facility]) lancets Use as  instructed 10/21/16   Dellia Nims, MD  nystatin (MYCOSTATIN) 100000 UNIT/ML suspension Take 5 mLs (500,000 Units total) by mouth 4 (four) times daily. Patient not taking: Reported on 04/27/2019 05/04/18   Asencion Noble, MD  Triamcinolone Acetonide (TRIAMCINOLONE 0.1 % CREAM : EUCERIN) CREA Apply 1 application topically 2 (two) times daily. Patient not taking: Reported on 04/27/2019 12/15/17   Nedrud,  Larena Glassman, MD  Vitamin D, Ergocalciferol, (DRISDOL) 50000 units CAPS capsule Take 1 capsule (50,000 Units total) by mouth every 7 (seven) days. Patient not taking: Reported on 12/22/2017 10/25/16   Dellia Nims, MD    Scheduled Meds: . insulin aspart  0-5 Units Subcutaneous QHS  . insulin aspart  0-9 Units Subcutaneous TID WC  . insulin glargine  10 Units Subcutaneous QHS  . levothyroxine  150 mcg Oral QAC breakfast  . sodium chloride flush  10-40 mL Intracatheter Q12H  . sodium chloride flush  3 mL Intravenous Q12H   Infusions: . sodium chloride 100 mL/hr at 04/27/19 0607   PRN Meds: acetaminophen **OR** acetaminophen, ondansetron (ZOFRAN) IV, sodium chloride flush, traMADol   Allergies as of 04/26/2019 - Review Complete 11/30/2018  Allergen Reaction Noted  . Ace inhibitors  07/31/2006  . Percocet [oxycodone-acetaminophen] Itching 06/27/2014  . Statins  03/05/2013  . Latex Rash 10/21/2016  . Sulfamethoxazole Rash 06/27/2014  . Sulfites Rash 03/04/2012    Family History  Problem Relation Age of Onset  . Emphysema Mother   . Cancer Mother        unknown  . Hypertension Mother   . Cancer Father        pancreatic   . Diabetes Father   . Hypertension Father   . Down syndrome Brother   . Cancer Maternal Aunt        cancer  . Cancer Other        breast cancer aunt  . Stroke Sister   . Diabetes Sister   . Colon cancer Neg Hx   . Stomach cancer Neg Hx     Social History   Socioeconomic History  . Marital status: Single    Spouse name: Not on file  . Number of children:  Not on file  . Years of education: 28  . Highest education level: Not on file  Occupational History    Employer: UNEMPLOYED  Social Needs  . Financial resource strain: Not on file  . Food insecurity    Worry: Not on file    Inability: Not on file  . Transportation needs    Medical: Not on file    Non-medical: Not on file  Tobacco Use  . Smoking status: Former Smoker    Packs/day: 0.75    Years: 5.00    Pack years: 3.75    Types: Cigarettes    Quit date: 09/30/1990    Years since quitting: 28.5  . Smokeless tobacco: Never Used  Substance and Sexual Activity  . Alcohol use: No    Alcohol/week: 0.0 standard drinks  . Drug use: No  . Sexual activity: Not Currently  Lifestyle  . Physical activity    Days per week: Not on file    Minutes per session: Not on file  . Stress: Not on file  Relationships  . Social Herbalist on phone: Not on file    Gets together: Not on file    Attends religious service: Not on file    Active member of club or organization: Not on file    Attends meetings of clubs or organizations: Not on file    Relationship status: Not on file  . Intimate partner violence    Fear of current or ex partner: Not on file    Emotionally abused: Not on file    Physically abused: Not on file    Forced sexual activity: Not on file  Other Topics Concern  . Not  on file  Social History Narrative   Lives in [place], alone. Looking for a job. Has a son in town who checks in on her.     REVIEW OF SYSTEMS: Constitutional: Weakness. ENT:  No nose bleeds Pulm: Some shortness of breath which is not as severe as her weakness.  No cough. CV:  No palpitations, no LE edema.  No chest pain. GU:  No hematuria, no frequency GI: Per HPI. Heme: Denies unusual or aggressive bleeding other than the black stools mentioned above Transfusions: As per HPI. Neuro:  No headaches, no peripheral tingling or numbness Derm:  No itching, no rash or sores.  Endocrine:  No  sweats or chills.  No polyuria or dysuria Immunization: Vaccinated for hepatitis B 2005 -2006 Travel:  None beyond local counties in last few months.    PHYSICAL EXAM: Vital signs in last 24 hours: Vitals:   04/27/19 0417 04/27/19 1101  BP: (!) 150/69 (!) 145/68  Pulse: 70 70  Resp: 18 18  Temp: 98.4 F (36.9 C) 98 F (36.7 C)  SpO2: 99% 99%   Wt Readings from Last 3 Encounters:  04/27/19 61 kg  02/26/19 65.4 kg  11/30/18 66.4 kg    General: Moderately ill-appearing, alert, comfortable. Head: No facial asymmetry. Eyes: Exophthalmos. Ears: Not hard of hearing Nose: No discharge Mouth: Mucosa moist, pink, clear.  Tongue midline.  Fair dentition. Neck: No JVD, no masses. Lungs: Clear bilaterally no labored breathing or cough. Heart: RRR.  No MRG.  S1, S2 present. Abdomen: Soft.  Not tender or distended.  No HSM, masses, bruits, hernias..   Rectal: DRE performed, rectum smooth.  No blood and no stool. Musc/Skeltl: No joint redness or swelling.  Post surgical deformity of left lateral ankle. Extremities: CCE. Neurologic: Alert.  Oriented x3.  Moves all 4 limbs, strength not tested.  Grossly no weakness.  No tremors. Skin: No obvious rash or sores. Nodes: No cervical adenopathy. Psych: Clipped speech.  Borderline annoyed but cooperative.  Intake/Output from previous day: 07/27 0701 - 07/28 0700 In: 2015.9 [P.O.:240; I.V.:836.9; Blood:939] Out: 998 [PJASN:053] Intake/Output this shift: No intake/output data recorded.  LAB RESULTS: Recent Labs    04/26/19 1212 04/27/19 0745  WBC 18.3*  --   HGB 4.7* 9.4*  HCT 14.6* 27.5*  PLT 217  --    BMET Lab Results  Component Value Date   NA 129 (L) 04/27/2019   NA 132 (L) 04/26/2019   NA 138 02/26/2019   K 4.7 04/27/2019   K 5.4 (H) 04/26/2019   K 4.7 02/26/2019   CL 109 04/27/2019   CL 109 04/26/2019   CL 116 (H) 02/26/2019   CO2 11 (L) 04/27/2019   CO2 11 (L) 04/26/2019   CO2 14 (L) 02/26/2019   GLUCOSE 178 (H)  04/27/2019   GLUCOSE 252 (H) 04/26/2019   GLUCOSE 129 (H) 02/26/2019   BUN 64 (H) 04/27/2019   BUN 70 (H) 04/26/2019   BUN 52 (H) 02/26/2019   CREATININE 5.43 (H) 04/27/2019   CREATININE 6.09 (H) 04/26/2019   CREATININE 4.59 (H) 02/26/2019   CALCIUM 8.5 (L) 04/27/2019   CALCIUM 9.3 04/26/2019   CALCIUM 8.5 (L) 02/26/2019   LFT Recent Labs    04/26/19 1212 04/27/19 0433  PROT 6.6 6.2*  ALBUMIN 2.2* 2.0*  AST 13* 12*  ALT 12 12  ALKPHOS 153* 155*  BILITOT 1.1 1.3*   PT/INR Lab Results  Component Value Date   INR 1.00 08/17/2012   Hepatitis Panel  No results for input(s): HEPBSAG, HCVAB, HEPAIGM, HEPBIGM in the last 72 hours. C-Diff No components found for: CDIFF Lipase     Component Value Date/Time   LIPASE 67 (H) 08/17/2012 2108    Drugs of Abuse     Component Value Date/Time   LABOPIA NONE DETECTED 08/18/2012 0026   COCAINSCRNUR NONE DETECTED 08/18/2012 0026   LABBENZ NONE DETECTED 08/18/2012 0026   AMPHETMU NONE DETECTED 08/18/2012 0026   THCU NONE DETECTED 08/18/2012 0026   LABBARB NONE DETECTED 08/18/2012 0026     RADIOLOGY STUDIES: No results found.  IMPRESSION:   *   Normocytic anemia.  . Patient has numerous reasons to be anemic including late stage kidney disease, poorly controlled hypothyroidism, sickle cell trait.  Black, unformed stools suggestive of GI bleed but Hemoccult positivity not yet established. The pattern of iron studies consistent with anemia of chronic disease.  *    Stage 5 CKD.  Given her inability to arrange for outpatient renal follow-up, would strongly encourage inpatient renal consult.    PLAN:     *    EGD arranged for mid morning tomorrow.  Patient agreeable.  She can eat tonight but n.p.o. after midnight.   Azucena Freed  04/27/2019, 1:00 PM Phone [phone]

## 2019-04-27 NOTE — Progress Notes (Signed)
ref

## 2019-04-27 NOTE — Telephone Encounter (Signed)
I attempted to return the call.  The number is to a [employer]. If there is a return call the patient is inpatient and the call should go to the inpatient APP

## 2019-04-27 NOTE — Progress Notes (Signed)
Patient still refusing to have chest xray completed. She states she is breathing fine and will let us know when she is ready to have it done.

## 2019-04-28 ENCOUNTER — Inpatient Hospital Stay (HOSPITAL_COMMUNITY): Payer: Self-pay

## 2019-04-28 ENCOUNTER — Encounter (HOSPITAL_COMMUNITY): Payer: Self-pay

## 2019-04-28 ENCOUNTER — Inpatient Hospital Stay (HOSPITAL_COMMUNITY): Payer: Self-pay | Admitting: Certified Registered Nurse Anesthetist

## 2019-04-28 ENCOUNTER — Encounter (HOSPITAL_COMMUNITY)
Admission: AD | Disposition: A | Discharge status: Home Health | Payer: Self-pay | Source: Ambulatory Visit | Attending: Internal Medicine

## 2019-04-28 DIAGNOSIS — R630 Anorexia: Secondary | ICD-10-CM | POA: Diagnosis present

## 2019-04-28 DIAGNOSIS — K209 Esophagitis, unspecified: Secondary | ICD-10-CM

## 2019-04-28 DIAGNOSIS — R110 Nausea: Secondary | ICD-10-CM | POA: Diagnosis present

## 2019-04-28 DIAGNOSIS — N186 End stage renal disease: Secondary | ICD-10-CM

## 2019-04-28 DIAGNOSIS — K2970 Gastritis, unspecified, without bleeding: Secondary | ICD-10-CM

## 2019-04-28 DIAGNOSIS — K269 Duodenal ulcer, unspecified as acute or chronic, without hemorrhage or perforation: Secondary | ICD-10-CM

## 2019-04-28 HISTORY — PX: ESOPHAGOGASTRODUODENOSCOPY (EGD) WITH PROPOFOL: SHX5813

## 2019-04-28 HISTORY — PX: BIOPSY: SHX5522

## 2019-04-28 LAB — TYPE AND SCREEN
ABO/RH(D): O POS
Antibody Screen: NEGATIVE
Unit division: 0
Unit division: 0
Unit division: 0

## 2019-04-28 LAB — RENAL FUNCTION PANEL
Albumin: 1.8 g/dL — ABNORMAL LOW (ref 3.5–5.0)
Anion gap: 10 (ref 5–15)
BUN: 62 mg/dL — ABNORMAL HIGH (ref 6–20)
CO2: 10 mmol/L — ABNORMAL LOW (ref 22–32)
Calcium: 9.1 mg/dL (ref 8.9–10.3)
Chloride: 113 mmol/L — ABNORMAL HIGH (ref 98–111)
Creatinine, Ser: 5.1 mg/dL — ABNORMAL HIGH (ref 0.44–1.00)
GFR calc Af Amer: 10 mL/min — ABNORMAL LOW (ref >60)
GFR calc non Af Amer: 9 mL/min — ABNORMAL LOW (ref >60)
Glucose, Bld: 87 mg/dL (ref 70–99)
Phosphorus: 4.3 mg/dL (ref 2.5–4.6)
Potassium: 4.7 mmol/L (ref 3.5–5.1)
Sodium: 133 mmol/L — ABNORMAL LOW (ref 135–145)

## 2019-04-28 LAB — FOLATE RBC
Folate, Hemolysate: 209 ng/mL
Folate, RBC: 1066 ng/mL (ref >498)
Hematocrit: 19.6 % — ABNORMAL LOW (ref 34.0–46.6)

## 2019-04-28 LAB — BPAM RBC
Blood Product Expiration Date: 202008172359
Blood Product Expiration Date: 202008192359
Blood Product Expiration Date: 202008192359
ISSUE DATE / TIME: 202007271737
ISSUE DATE / TIME: 202007272209
ISSUE DATE / TIME: 202007280121
Unit Type and Rh: 5100
Unit Type and Rh: 5100
Unit Type and Rh: 5100

## 2019-04-28 LAB — GLUCOSE, CAPILLARY
Glucose-Capillary: 82 mg/dL (ref 70–99)
Glucose-Capillary: 114 mg/dL — ABNORMAL HIGH (ref 70–99)
Glucose-Capillary: 60 mg/dL — ABNORMAL LOW (ref 70–99)
Glucose-Capillary: 100 mg/dL — ABNORMAL HIGH (ref 70–99)
Glucose-Capillary: 100 mg/dL — ABNORMAL HIGH (ref 70–99)
Glucose-Capillary: 117 mg/dL — ABNORMAL HIGH (ref 70–99)
Glucose-Capillary: 172 mg/dL — ABNORMAL HIGH (ref 70–99)

## 2019-04-28 LAB — PROCALCITONIN: Procalcitonin: 19.19 ng/mL

## 2019-04-28 LAB — CBC
HCT: 31.9 % — ABNORMAL LOW (ref 36.0–46.0)
Hemoglobin: 10.8 g/dL — ABNORMAL LOW (ref 12.0–15.0)
MCH: 29.8 pg (ref 26.0–34.0)
MCHC: 33.9 g/dL (ref 30.0–36.0)
MCV: 88.1 fL (ref 80.0–100.0)
Platelets: 177 10*3/uL (ref 150–400)
RBC: 3.62 MIL/uL — ABNORMAL LOW (ref 3.87–5.11)
RDW: 15.9 % — ABNORMAL HIGH (ref 11.5–15.5)
WBC: 24.4 10*3/uL — ABNORMAL HIGH (ref 4.0–10.5)
nRBC: 0.1 % (ref 0.0–0.2)

## 2019-04-28 LAB — MAGNESIUM: Magnesium: 1.3 mg/dL — ABNORMAL LOW (ref 1.7–2.4)

## 2019-04-28 LAB — C-REACTIVE PROTEIN: CRP: 28.1 mg/dL — ABNORMAL HIGH (ref <1.0)

## 2019-04-28 SURGERY — ESOPHAGOGASTRODUODENOSCOPY (EGD) WITH PROPOFOL
Anesthesia: Monitor Anesthesia Care

## 2019-04-28 MED ORDER — ONDANSETRON HCL 4 MG/2ML IJ SOLN
INTRAMUSCULAR | Status: DC | PRN
  Administered 2019-04-28: 4 mg via INTRAVENOUS

## 2019-04-28 MED ORDER — MAGNESIUM SULFATE 4 GM/100ML IV SOLN
4.0000 g | Freq: Once | INTRAVENOUS | Status: AC
  Administered 2019-04-28: 4 g via INTRAVENOUS
  Filled 2019-04-28: qty 100

## 2019-04-28 MED ORDER — PANTOPRAZOLE SODIUM 40 MG PO TBEC
40.0000 mg | DELAYED_RELEASE_TABLET | Freq: Two times a day (BID) | ORAL | Status: DC
  Administered 2019-04-28 – 2019-05-03 (×11): 40 mg via ORAL
  Filled 2019-04-28 (×11): qty 1

## 2019-04-28 MED ORDER — SODIUM CHLORIDE 0.9 % IV SOLN
INTRAVENOUS | Status: DC | PRN
  Administered 2019-04-28: 11:00:00 via INTRAVENOUS

## 2019-04-28 MED ORDER — SODIUM CHLORIDE 0.9 % IV SOLN
INTRAVENOUS | Status: DC | PRN
  Administered 2019-04-28 – 2019-05-01 (×3): 1000 mL via INTRAVENOUS

## 2019-04-28 MED ORDER — SODIUM CHLORIDE 0.9 % IV SOLN
1.0000 g | INTRAVENOUS | Status: DC
  Administered 2019-04-28 – 2019-05-02 (×5): 1 g via INTRAVENOUS
  Filled 2019-04-28 (×5): qty 10

## 2019-04-28 MED ORDER — SODIUM CHLORIDE 0.9 % IV SOLN
500.0000 mg | Freq: Once | INTRAVENOUS | Status: AC
  Administered 2019-04-28: 500 mg via INTRAVENOUS
  Filled 2019-04-28: qty 500

## 2019-04-28 MED ORDER — DEXTROSE 50 % IV SOLN
INTRAVENOUS | Status: AC
  Filled 2019-04-28: qty 50

## 2019-04-28 MED ORDER — SODIUM CHLORIDE 0.9 % IV SOLN: INTRAVENOUS | Status: DC

## 2019-04-28 MED ORDER — DEXTROSE 50 % IV SOLN
12.5000 g | Freq: Once | INTRAVENOUS | Status: AC
  Administered 2019-04-28: 12.5 g via INTRAVENOUS

## 2019-04-28 MED ORDER — PROPOFOL 10 MG/ML IV BOLUS
INTRAVENOUS | Status: DC | PRN
  Administered 2019-04-28: 30 mg via INTRAVENOUS
  Administered 2019-04-28: 20 mg via INTRAVENOUS

## 2019-04-28 MED ORDER — SODIUM BICARBONATE 8.4 % IV SOLN
25.0000 meq | Freq: Once | INTRAVENOUS | Status: AC
  Administered 2019-04-28: 25 meq via INTRAVENOUS
  Filled 2019-04-28: qty 50

## 2019-04-28 MED ORDER — DEXTROSE 5 % IV SOLN
250.0000 mg | INTRAVENOUS | Status: DC
  Administered 2019-04-29: 250 mg via INTRAVENOUS
  Filled 2019-04-28 (×2): qty 250

## 2019-04-28 MED ORDER — PROPOFOL 500 MG/50ML IV EMUL
INTRAVENOUS | Status: DC | PRN
  Administered 2019-04-28: 100 ug/kg/min via INTRAVENOUS

## 2019-04-28 MED ORDER — LIDOCAINE HCL (CARDIAC) PF 100 MG/5ML IV SOSY
PREFILLED_SYRINGE | INTRAVENOUS | Status: DC | PRN
  Administered 2019-04-28: 60 mg via INTRATRACHEAL

## 2019-04-28 SURGICAL SUPPLY — 15 items

## 2019-04-28 NOTE — Progress Notes (Signed)
Upper extremity vein mapping has been completed.   Preliminary results in CV Proc.   Abram Sander 04/28/2019 10:52 AM

## 2019-04-28 NOTE — Progress Notes (Signed)
Patient refused go down for xrays reported too painful reported if doctor wants all those views on xray then I need something stronger for pain if not see if he will let me have the xray portable in my room.  Writer beepeed MD to check with him see what he would like to do.

## 2019-04-28 NOTE — Anesthesia Procedure Notes (Signed)
Procedure Name: MAC Date/Time: 04/28/2019 11:17 AM Performed by: Kathryne Hitch, CRNA Pre-anesthesia Checklist: Patient identified, Emergency Drugs available, Suction available and Patient being monitored Patient Re-evaluated:Patient Re-evaluated prior to induction Oxygen Delivery Method: Nasal cannula Preoxygenation: Pre-oxygenation with 100% oxygen Induction Type: IV induction Dental Injury: Teeth and Oropharynx as per pre-operative assessment

## 2019-04-28 NOTE — Progress Notes (Signed)
Patient ID: Kayla [name], female   DOB: 1960/12/04, 58 y.o.   MRN: [id number] [facility] Progress Note   Assessment/ Plan:   1.  Acute kidney injury on chronic kidney disease stage IV: Likely hemodynamically mediated by severe anemia/limited oral intake prior to presentation in the setting of progressive chronic kidney disease from poorly treated hypertension/diabetes.  Renal function appears to be improving and urine output charted appears inaccurate.  No acute indications for dialysis at this time.  For future patients including Kayla [name] with advanced chronic kidney disease-I would strongly recommend refraining from placement of PICC lines as this causes significant vascular sclerosis and limits dialysis access options for the future. 2.  Severe anemia: With significant iron deficiency status post replacement with Feraheme.  Status post PRBC transfusion and plans noted for endoscopy to evaluate for GI loss. 3.  Hypertension: Continue to monitor blood pressures with volume expansion and resume antihypertensive therapy with the exception of RAS blocker/diuretics at this time. 4.  Hyponatremia: Secondary to impaired water handling with acute kidney injury and unrestricted oral fluid intake. Slightly better this morning likely because of n.p.o. status after midnight. 5.  Hypothyroidism: Appears uncontrolled and indeed may be contributing to her acute kidney injury.  Adjust levothyroxine dose. 6.  Fever/leukocytosis: Leukocytosis worsening without overnight fever-May need to evaluate for hematological reasons for anemia/leukocytosis.  Subjective:   Reports to be feeling fair, denies any chest pain or shortness of breath   Objective:   BP (!) 145/65 (BP Location: Left Arm)   Pulse 86   Temp 98.9 F (37.2 C) (Oral)   Resp 17   Wt 59 kg   LMP 01/12/2014   SpO2 95%   BMI 23.79 kg/m   Intake/Output Summary (Last 24 hours) at 04/28/2019 0834 Last data filed at 04/28/2019 0441 Gross  per 24 hour  Intake 1830.78 ml  Output 200 ml  Net 1630.78 ml   Weight change: -2 kg  Physical Exam: Gen: Comfortably resting in bed, working with physical therapy CVS: Pulse regular rhythm, normal rate, S1 and S2 normal Resp: Clear to auscultation bilaterally, no rales or rhonchi Abd: Soft, flat, nontender Ext: No lower extremity edema  Imaging: No results found.  Labs: BMET Recent Labs  Lab 04/26/19 1212 04/27/19 0433 04/28/19 0410  NA 132* 129* 133*  K 5.4* 4.7 4.7  CL 109 109 113*  CO2 11* 11* 10*  GLUCOSE 252* 178* 87  BUN 70* 64* 62*  CREATININE 6.09* 5.43* 5.10*  CALCIUM 9.3 8.5* 9.1  PHOS  --   --  4.3   CBC Recent Labs  Lab 04/26/19 1212 04/27/19 0745 04/28/19 0410  WBC 18.3*  --  24.4*  NEUTROABS 15.8*  --   --   HGB 4.7* 9.4* 10.8*  HCT 14.6* 27.5* 31.9*  MCV 93.0  --  88.1  PLT 217  --  177    Medications:    . insulin aspart  0-5 Units Subcutaneous QHS  . insulin aspart  0-9 Units Subcutaneous TID WC  . insulin glargine  10 Units Subcutaneous QHS  . levothyroxine  150 mcg Oral QAC breakfast  . sodium chloride flush  10-40 mL Intracatheter Q12H  . sodium chloride flush  3 mL Intravenous Q12H   Elmarie Shiley, MD 04/28/2019, 8:34 AM

## 2019-04-28 NOTE — Interval H&P Note (Signed)
History and Physical Interval Note: For EGD today with MAC to eval anemia with dark stools, anorexia and nausea. The nature of the procedure, as well as the risks, benefits, and alternatives were carefully and thoroughly reviewed with the patient. Ample time for discussion and questions allowed. The patient understood, was satisfied, and agreed to proceed.    04/28/2019 10:42 AM  Kayla [name]  has presented today for surgery, with the diagnosis of Anemia, loose/black stool, weight loss.  The various methods of treatment have been discussed with the patient and family. After consideration of risks, benefits and other options for treatment, the patient has consented to  Procedure(s): ESOPHAGOGASTRODUODENOSCOPY (EGD) WITH PROPOFOL (N/A) as a surgical intervention.  The patient's history has been reviewed, patient examined, no change in status, stable for surgery.  I have reviewed the patient's chart and labs.  Questions were answered to the patient's satisfaction.     Lajuan Lines [facility]

## 2019-04-28 NOTE — Progress Notes (Addendum)
Subjective: Pt seen at the bedside on rounds this AM. She is aware that she has EGD scheduled with GI today. She mentioned that they kept bothering her about an xray and we explained that we were looking for possible sources of infection due to her recorded fevers and elevated white blood cells. She mentioned she has a painful tooth that we should take a look at. She states she is not yet comfortable getting up on her own. We discussed the need for continued PT and she requested that she go back to her prior PT that she saw prior to coming in.  Objective:  Vital signs in last 24 hours: Vitals:   04/27/19 1612 04/27/19 1959 04/28/19 0430 04/28/19 0722  BP: (!) 154/63 (!) 148/88 (!) 156/74 (!) 145/65  Pulse: 84 86 91 86  Resp: 18 18 17 17   Temp: 98.8 F (37.1 C) 98.2 F (36.8 C) 99.2 F (37.3 C) 98.9 F (37.2 C)  TempSrc: Oral Oral Oral Oral  SpO2: 100% 97% 94% 95%  Weight:   59 kg    Physical Exam Vitals signs reviewed.  Constitutional:      General: She is not in acute distress.    Appearance: Normal appearance. She is normal weight. She is not ill-appearing, toxic-appearing or diaphoretic.  HENT:     Head: Normocephalic and atraumatic.  Eyes:     General: No scleral icterus.       Right eye: No discharge.        Left eye: No discharge.     Extraocular Movements: Extraocular movements intact.  Cardiovascular:     Rate and Rhythm: Normal rate and regular rhythm.     Pulses: Normal pulses.     Heart sounds: No murmur. No friction rub. No gallop.   Pulmonary:     Effort: Pulmonary effort is normal. No respiratory distress.     Breath sounds: Normal breath sounds.     Comments: R sided crackles appreciated at the base and mid level of the lung  Abdominal:     General: Abdomen is flat. Bowel sounds are normal. There is no distension.     Palpations: Abdomen is soft.     Tenderness: There is no abdominal tenderness. There is no guarding.  Musculoskeletal:        General: No  swelling or deformity.     Right lower leg: No edema.     Left lower leg: No edema.  Skin:    General: Skin is warm.  Neurological:     General: No focal deficit present.     Mental Status: She is alert and oriented to person, place, and time. Mental status is at baseline.  Psychiatric:        Mood and Affect: Mood normal.    Assessment/Plan:  Principal Problem:   Symptomatic anemia Active Problems:   Hypothyroidism, postradioiodine therapy   DM type 2, uncontrolled, with severe neuropathy  In summary, Ms. Mello is a 58 year old lady with a history of HTN, T2DM, hypothyroidism, CKDstage IV, sickle cell trait, MDD, and fibromyalgia who presented to the hospital as a direct admission from outpatient clinic for presumed blood loss anemia.She presented with increased fatigue, generalized weakness and pain due to her fibromyalgia in addition to having black tarry stools over the last several weeks to months. In the clinic, her CBC was notable for a low hemoglobin of 4.7 and leukocytosis of 18.3.Her presentation was consistent with symptomatic anemia, infectious etiologies are unclear at the moment.The  patient says se feels much better today.  #Symptomatic Anemiaof Unclear Etiology:Hemoglobin was 4.7 on admission. Baseline around 8.0.S/p pRBC 3U. Hgb now 10.8. The patient has noted black tarry stools over the last several weeks that have correlated with her increased fatigue, generalized weakness, and increased pain. This is concerning for a blood from a GI source. Other notable labs such as serum ferritin, iron, TIBC, reticulocyte count, LDH were also obtained. Iron studies consistent with iron deficiency anemia with the exception of an elevated ferritin (691). Bilirubin and LDH was normal, ruling out any hemolytic process. Reticulocyte index calculated and was severely low (1.8), underscoring a production problem. This makes sense given the patient's advanced kidney disease. - GI did upper  endoscopy. Their recs are as follows:  - Advance diet as tolerated.  - Continue present medications. Begin PPI BID for at least 4 weeks, then daily  thereafter.  - Avoid NSAIDs.  - Await biopsy results and to exclude viral esophagitis and H. Pylori - Telemetry - Daily CBC  #Fever #Leukocytosis: Pt had white count of 18.3 in the clinic and has increased to 24.4 today . She also had a mild fever of 100.9 in office but resolved once she was admitted (98.9). Spiked fever again overnight to 100.6 on day 1 of hospitalization but resolved again. Infectious source is unknown/undetermined at this time. UA was unremarkable. Pt denies any respiratory symptoms at this time, but crackles appreciated on pulmonary exam. - CXR, will order after EGD today. - Daily CBC - consider abx if the patient develops more convincing signs of infection   #Depression #Allodynia  #Fibromyalgia:The patient has been prescribed gabapentin600 mg QIDand duloxetine 60 mg dailyin the past, however the patient says she is does not take this currently. It makes her feel spaced out and she forgets what happened the week prior. She is very reluctant to opoid use for pain management. On initial exam the slightest brush of her clothes or skin produces large amounts of pain. Today the patient is still moving around in bed freely and has much less pain. She is happy with the current pain regimen. - Tramadol 50 mg q8hr PRN  - Tylenol 650 mg q6hr PRN  #CKD stage IV #AKI: Cr. 6.09 on admission, now 5.1, baseline around 3.0. Injury likely due to volume depletion and blood loss. The patient says she is okay with going on dialysis short term if it will be only used temorparily, but is not interested in being on dialysis long term for ESRD. Bicarb low at 10 today and has been persistently low. Nephrology on board, will appreciate recs: - Avoid PICC line placement due to potential dialysis access options in the future. No indication for  acute dialysis at this time. - Start sodium bicarbonate supplement, dosing 0.5 meq/kg sodium bicarb per [employer] guidelines  - Continue NS 100 mL/hr  - Monitor with daily BMP  #HTN: BP was elevated at 173/74 at her clinic visit today prior to admission. She is prescribed amlodipine 5 mg daily as well as lasix 40 mg daily. - d/c Amlodipine and Lasix  #T2DM: T2DM with severe peripheral neuropathy listed in chart, however last A1c 4.8 in March 2020. Was as high as 9.3 in May 2015. - Lantus 10U nightly  - SSI sensitive + HS correction   #Hypothyroidism: Last TSH was highly elevated 196.14 at previous outpatient visit and 64.8 on admission She was prescribed Synthroid 150 mg daily on 02/26/2019.  - Continue home Levothyroxine 150 mg   #  FEN/GI #Hyponatremia: 132 on admission, 129 today. #Hyperkalemia: 5.4 on admission. Resolved to 4.7 today. #Hypoalbuminemia: 2.2 on admission, 2.0 toady. #Hyperclacemia: Corrected level mildy elevated 10.7 on admission, normal at 10.1 today. - NPO, will order diet post EGD - Zofran 4 mg injections q6hr PRN - EKG today  #Code status: Pt stated she is okay with CPR, but does not want to be put an ventilator under any circumstance  Dispo: Discharge pending medical workup. Expected LOS 2 days. Pt expresses desire to see PT she already works with once discharged.  Earlene Plater, MD Internal Medicine, PGY1 Pager: [phone]  04/28/2019,8:58 AM

## 2019-04-28 NOTE — Anesthesia Postprocedure Evaluation (Signed)
Anesthesia Post Note  Patient: Olar Santini  Procedure(s) Performed: ESOPHAGOGASTRODUODENOSCOPY (EGD) WITH PROPOFOL (N/A ) BIOPSY     Patient location during evaluation: Endoscopy Anesthesia Type: MAC Level of consciousness: awake and alert, oriented and patient cooperative Pain management: pain level controlled Vital Signs Assessment: post-procedure vital signs reviewed and stable Respiratory status: nonlabored ventilation, spontaneous breathing, respiratory function stable and patient connected to nasal cannula oxygen Cardiovascular status: blood pressure returned to baseline and stable Postop Assessment: no apparent nausea or vomiting Anesthetic complications: no    Last Vitals:  Vitals:   04/28/19 1150 04/28/19 1200  BP: 121/60 (!) 142/62  Pulse: 80 80  Resp: 19 (!) 26  Temp:    SpO2: 93% 96%    Last Pain:  Vitals:   04/28/19 1200  TempSrc:   PainSc: 0-No pain                 Shery Wauneka,E. Deetta Siegmann

## 2019-04-28 NOTE — Op Note (Signed)
[facility] Patient Name: Kayla [name] Procedure Date : 04/28/2019 MRN: [id number] Attending MD: Jerene Bears , MD Date of Birth: 01-21-1961 CSN: [id number] Age: 58 Admit Type: Inpatient Procedure:                Upper GI endoscopy Indications:              Anorexia, Nausea, dark stool and multifactorial                            anemia Providers:                Lajuan Lines. Hilarie Fredrickson, MD, Burtis Junes, RN Marguerita Merles,                            Technician Referring MD:             Internal Medicine Teaching Service Medicines:                Monitored Anesthesia Care Complications:            No immediate complications. Estimated Blood Loss:     Estimated blood loss was minimal. Procedure:                Pre-Anesthesia Assessment:                           - Prior to the procedure, a History and Physical                            was performed, and patient medications and                            allergies were reviewed. The patient's tolerance of                            previous anesthesia was also reviewed. The risks                            and benefits of the procedure and the sedation                            options and risks were discussed with the patient.                            All questions were answered, and informed consent                            was obtained. Prior Anticoagulants: The patient has                            taken no previous anticoagulant or antiplatelet                            agents. ASA Grade Assessment: III - A patient with  severe systemic disease. After reviewing the risks                            and benefits, the patient was deemed in                            satisfactory condition to undergo the procedure.                           After obtaining informed consent, the endoscope was                            passed under direct vision. Throughout the                            procedure,  the patient's blood pressure, pulse, and                            oxygen saturations were monitored continuously. The                            GIF-H190 ([id number]) Olympus gastroscope was                            introduced through the mouth, and advanced to the                            second part of duodenum. The upper GI endoscopy was                            accomplished without difficulty. The patient                            tolerated the procedure well. Scope In: Scope Out: Findings:      LA Grade C (one or more mucosal breaks continuous between tops of 2 or       more mucosal folds, less than 75% circumference) esophagitis with no       bleeding was found 35 to 36 cm from the incisors. Biopsies were taken       with a cold forceps for histology.      The Z-line was regular and was found 40 cm from the incisors.      Diffuse mild inflammation characterized by adherent blood and erythema       was found in the gastric body and in the gastric antrum. Biopsies were       taken with a cold forceps for histology and Helicobacter pylori testing.      One non-bleeding superficial duodenal ulcer with no stigmata of bleeding       was found in the duodenal bulb. The lesion was 5 mm in largest dimension.      The second portion of the duodenum was normal. Impression:               - LA Grade C acute and erosive esophagitis.  Biopsied.                           - Z-line regular, 40 cm from the incisors.                           - Gastritis. Biopsied.                           - Non-bleeding duodenal ulcer with no stigmata of                            bleeding.                           - Normal second portion of the duodenum. Moderate Sedation:      N/A Recommendation:           - Patient has a contact number available for                            emergencies. The signs and symptoms of potential                            delayed complications were  discussed with the                            patient. Return to normal activities tomorrow.                            Written discharge instructions were provided to the                            patient.                           - Advance diet as tolerated.                           - Continue present medications. Begin PPI BID for                            at least 4 weeks, then daily thereafter.                           - Avoid NSAIDs.                           - Await pathology results and to exclude viral                            esophagitis and H. Pylori. Procedure Code(s):        --- Professional ---                           [phone], Esophagogastroduodenoscopy, flexible,  transoral; with biopsy, single or multiple Diagnosis Code(s):        --- Professional ---                           K20.8, Other esophagitis                           K29.70, Gastritis, unspecified, without bleeding                           K26.9, Duodenal ulcer, unspecified as acute or                            chronic, without hemorrhage or perforation                           R63.0, Anorexia                           R11.0, Nausea CPT copyright 2019 [employer]. All rights reserved. The codes documented in this report are preliminary and upon coder review may  be revised to meet current compliance requirements. Jerene Bears, MD 04/28/2019 11:51:34 AM This report has been signed electronically. Number of Addenda: 0

## 2019-04-28 NOTE — Progress Notes (Signed)
PT Cancellation Note  Patient Details Name: Kayla [name] MRN: [id number] DOB: 1961/05/18   Cancelled Treatment:    Reason Eval/Treat Not Completed: Patient declined, no reason specified- patient just wants to rest after procedure today. States that tomorrow she would like to get up and sit in the recliner. Will return tomorrow to assess mobility.    Dezi Brauner 04/28/2019, 2:34 PM

## 2019-04-28 NOTE — Evaluation (Signed)
Occupational Therapy Evaluation Patient Details Name: Kayla [name] MRN: [id number] DOB: 1961-08-16 Today's Date: 04/28/2019    History of Present Illness Patient is a 58 year old female admitted with weakness. Diagnosed with symtomatic anemia hgb 4.9 when admitted. PMH to include fibromyalgia, CKD, sickle cell, DM, hypothyroidism, HTN.   Clinical Impression   PTA patient reports independent ADLs/mobility, declining prior to admission due to severe weakness and activity tolerance. Admitted for above and limited by problem list below, including generalized weakness, decreased activity tolerance. Limited eval, as patient declines further mobility from transition to EOB.  Anticipate she is min assist for UB/LB ADLs at this time.  She reports using bed pan, encouraged her to use [facility] with staff assistance for toileting needs--she reports "not yet". 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  Provided with IS, educated on use and patient reports "oh this is bad, I have work to do".  Will follow acutely, and at this time recommend continued OT services after discharge at [facility] level.  If her mobility does not progress as expected, may need to consider a higher level of care.     Follow Up Recommendations  Home health OT    Equipment Recommendations  3 in 1 bedside commode    Recommendations for Other Services       Precautions / Restrictions Precautions Precautions: Fall Restrictions Weight Bearing Restrictions: No      Mobility Bed Mobility Overal bed mobility: Needs Assistance Bed Mobility: Rolling;Supine to Sit;Sit to Supine Rolling: Modified independent (Device/Increase time)   Supine to sit: Supervision Sit to supine: Supervision   General bed mobility comments: supervision for safety/line mgmt   Transfers                 General transfer comment: pt declined     Balance Overall balance assessment: Needs assistance Sitting-balance support: No upper extremity supported;Feet supported Sitting balance-Leahy Scale: Good                                     ADL either performed or assessed with clinical judgement   ADL Overall ADL's : Needs assistance/impaired     Grooming: Set up;Sitting;Bed level   Upper Body Bathing: Sitting;Bed level;Minimal assistance   Lower Body Bathing: Minimal assistance;Bed level   Upper Body Dressing : Minimal assistance;Bed level   Lower Body Dressing: Minimal assistance;Bed level     Toilet Transfer Details (indicate cue type and reason): pt declined          Functional mobility during ADLs: Supervision/safety;Min guard General ADL Comments: limited to bed level/EOB, patient refused further mobility d/t weakness; encouarged use of 3:1 commode and sitting in recliner later today/tomorrow      Vision         Perception     Praxis      Pertinent Vitals/Pain Pain Assessment: Faces Faces Pain Scale: Hurts a little bit Pain Location: B hands, back, left shoulder Pain Descriptors / Indicators: Aching;Discomfort;Grimacing Pain Intervention(s): Monitored during session;Repositioned     Hand Dominance     Extremity/Trunk Assessment Upper Extremity Assessment Upper Extremity Assessment: Generalized weakness   Lower Extremity Assessment Lower Extremity Assessment: Defer to PT evaluation   Cervical / Trunk Assessment Cervical / Trunk Assessment: Normal   Communication Communication Communication: No difficulties   Cognition  Arousal/Alertness: Awake/alert Behavior During Therapy: WFL for tasks assessed/performed Overall Cognitive Status: Within Functional Limits for tasks assessed                                 General Comments: pt self limiting   General Comments  provided pt with IS- pulled up to 750 but inconsistent,  encouarged use 10x/hr     Exercises     Shoulder Instructions      Home Living Family/patient expects to be discharged to:: Private residence Living Arrangements: Alone Available Help at Discharge: Family(reports niece can assist 24/7) Type of Home: Apartment Home Access: Stairs to enter Entrance Stairs-Number of Steps: 3 Entrance Stairs-Rails: Right Home Layout: One level     Bathroom Shower/Tub: Teacher, early years/pre: Standard     Home Equipment: Wheelchair - manual;Hand held shower head;Shower seat;Walker - 2 wheels;Cane - single point          Prior Functioning/Environment Level of Independence: Independent with assistive device(s)        Comments: patient reports she is no longer driving, was unable to walk most recently due to severe weakness, using WC at times; independent ADLs, limited IADLs        OT Problem List: Decreased strength;Decreased activity tolerance;Impaired balance (sitting and/or standing);Decreased safety awareness;Decreased knowledge of use of DME or AE;Cardiopulmonary status limiting activity      OT Treatment/Interventions: Self-care/ADL training;Therapeutic exercise;DME and/or AE instruction;Therapeutic activities;Balance training;Patient/family education;Energy conservation    OT Goals(Current goals can be found in the care plan section) Acute Rehab OT Goals Patient Stated Goal: to go home, to feel less tired OT Goal Formulation: With patient Time For Goal Achievement: 05/12/19 Potential to Achieve Goals: Good  OT Frequency: Min 2X/week   Barriers to D/C:            Co-evaluation              AM-PAC OT "6 Clicks" Daily Activity     Outcome Measure Help from another person eating meals?: A Little Help from another person taking care of personal grooming?: A Little Help from another person toileting, which includes using toliet, bedpan, or urinal?: A Lot Help from another person bathing (including washing,  rinsing, drying)?: A Little Help from another person to put on and taking off regular upper body clothing?: A Little Help from another person to put on and taking off regular lower body clothing?: A Little 6 Click Score: 17   End of Session Nurse Communication: Mobility status  Activity Tolerance: Patient tolerated treatment well Patient left: in bed;with call bell/phone within reach;with bed alarm set  OT Visit Diagnosis: Other abnormalities of gait and mobility (R26.89);Muscle weakness (generalized) (M62.81)                Time: 6767-2094 OT Time Calculation (min): 23 min Charges:  OT General Charges $OT Visit: 1 Visit OT Evaluation $OT Eval Low Complexity: 1 Low OT Treatments $Self Care/Home Management : 8-22 mins  Delight Stare, OT Acute Rehabilitation Services Pager [phone] Office [phone]    Delight Stare 04/28/2019, 9:18 AM

## 2019-04-28 NOTE — Plan of Care (Signed)

## 2019-04-28 NOTE — Transfer of Care (Signed)
Immediate Anesthesia Transfer of Care Note  Patient: Kayla [name]  Procedure(s) Performed: ESOPHAGOGASTRODUODENOSCOPY (EGD) WITH PROPOFOL (N/A ) BIOPSY  Patient Location: Endoscopy Unit  Anesthesia Type:MAC  Level of Consciousness: awake and patient cooperative  Airway & Oxygen Therapy: Patient Spontanous Breathing and Patient connected to nasal cannula oxygen  Post-op Assessment: Report given to RN  Post vital signs: Reviewed  Last Vitals:  Vitals Value Taken Time  BP 119/61   Temp    Pulse 80   Resp    SpO2 93     Last Pain:  Vitals:   04/28/19 1034  TempSrc: Temporal  PainSc: Asleep      Patients Stated Pain Goal: 0 (41/03/01 3143)  Complications: No apparent anesthesia complications

## 2019-04-28 NOTE — Progress Notes (Signed)
Patient taken down in her bed for procedure today remains NPO report given to transport nurse.

## 2019-04-28 NOTE — Progress Notes (Signed)
Hypoglycemic Event  CBG: 60  Treatment: half amp of D 50   Symptoms:  None  Follow-up CBG: Time:1110  CBG Result:114  Possible Reasons for Event:NPO  Comments/MD notified:Jackson MD aware    Alroy Dust

## 2019-04-28 NOTE — Progress Notes (Signed)
PT Cancellation Note  Patient Details Name: Josaphine Shimamoto MRN: [id number] DOB: 07/12/1961   Cancelled Treatment:    Reason Eval/Treat Not Completed: Patient at procedure or test/unavailable Patient at EGD currently. Not available for PT. Will re-attempt at later time/day.  Jasper Hanf 04/28/2019, 11:37 AM

## 2019-04-28 NOTE — Anesthesia Preprocedure Evaluation (Addendum)
Anesthesia Evaluation  Patient identified by MRN, date of birth, ID band Patient awake    Reviewed: Allergy & Precautions, NPO status , Patient's Chart, lab work & pertinent test results  History of Anesthesia Complications Negative for: history of anesthetic complications  Airway Mallampati: II  TM Distance: >3 FB Neck ROM: Full    Dental  (+) Missing, Chipped, Dental Advisory Given   Pulmonary COPD, former smoker,  04/26/2019 SARS coronavirus NEG   breath sounds clear to auscultation       Cardiovascular hypertension, Pt. on medications (-) angina Rhythm:Regular Rate:Normal     Neuro/Psych  Headaches, Anxiety Depression    GI/Hepatic Neg liver ROS, GI bleed   Endo/Other  diabetes (glu 114), Insulin DependentHypothyroidism   Renal/GU Renal InsufficiencyRenal disease (creat 5.10)     Musculoskeletal  (+) Arthritis , Fibromyalgia -  Abdominal   Peds  Hematology  (+) Sickle cell trait ,   Anesthesia Other Findings   Reproductive/Obstetrics                            Anesthesia Physical Anesthesia Plan  ASA: III  Anesthesia Plan: MAC   Post-op Pain Management:    Induction:   PONV Risk Score and Plan: 2 and Treatment may vary due to age or medical condition  Airway Management Planned: Natural Airway and Nasal Cannula  Additional Equipment:   Intra-op Plan:   Post-operative Plan:   Informed Consent: I have reviewed the patients History and Physical, chart, labs and discussed the procedure including the risks, benefits and alternatives for the proposed anesthesia with the patient or authorized representative who has indicated his/her understanding and acceptance.     Dental advisory given  Plan Discussed with: CRNA and Surgeon  Anesthesia Plan Comments:         Anesthesia Quick Evaluation

## 2019-04-29 DIAGNOSIS — J189 Pneumonia, unspecified organism: Secondary | ICD-10-CM

## 2019-04-29 HISTORY — DX: Pneumonia, unspecified organism: J18.9

## 2019-04-29 LAB — PATHOLOGIST SMEAR REVIEW

## 2019-04-29 LAB — GLUCOSE, CAPILLARY
Glucose-Capillary: 172 mg/dL — ABNORMAL HIGH (ref 70–99)
Glucose-Capillary: 123 mg/dL — ABNORMAL HIGH (ref 70–99)
Glucose-Capillary: 90 mg/dL (ref 70–99)
Glucose-Capillary: 79 mg/dL (ref 70–99)

## 2019-04-29 LAB — CBC WITH DIFFERENTIAL/PLATELET
Abs Immature Granulocytes: 0.93 10*3/uL — ABNORMAL HIGH (ref 0.00–0.07)
Basophils Absolute: 0.1 10*3/uL (ref 0.0–0.1)
Basophils Relative: 0 %
Eosinophils Absolute: 0 10*3/uL (ref 0.0–0.5)
Eosinophils Relative: 0 %
HCT: 30.8 % — ABNORMAL LOW (ref 36.0–46.0)
Hemoglobin: 10.3 g/dL — ABNORMAL LOW (ref 12.0–15.0)
Immature Granulocytes: 4 %
Lymphocytes Relative: 2 %
Lymphs Abs: 0.5 10*3/uL — ABNORMAL LOW (ref 0.7–4.0)
MCH: 29.9 pg (ref 26.0–34.0)
MCHC: 33.4 g/dL (ref 30.0–36.0)
MCV: 89.3 fL (ref 80.0–100.0)
Monocytes Absolute: 0.3 10*3/uL (ref 0.1–1.0)
Monocytes Relative: 1 %
Neutro Abs: 21.7 10*3/uL — ABNORMAL HIGH (ref 1.7–7.7)
Neutrophils Relative %: 93 %
Platelets: 161 10*3/uL (ref 150–400)
RBC: 3.45 MIL/uL — ABNORMAL LOW (ref 3.87–5.11)
RDW: 16.3 % — ABNORMAL HIGH (ref 11.5–15.5)
WBC: 23.5 10*3/uL — ABNORMAL HIGH (ref 4.0–10.5)
nRBC: 0.1 % (ref 0.0–0.2)

## 2019-04-29 LAB — PROCALCITONIN: Procalcitonin: 24.66 ng/mL

## 2019-04-29 LAB — HIV ANTIBODY (ROUTINE TESTING W REFLEX): HIV Screen 4th Generation wRfx: NONREACTIVE

## 2019-04-29 LAB — RENAL FUNCTION PANEL
Albumin: 1.7 g/dL — ABNORMAL LOW (ref 3.5–5.0)
Anion gap: 11 (ref 5–15)
BUN: 60 mg/dL — ABNORMAL HIGH (ref 6–20)
CO2: 11 mmol/L — ABNORMAL LOW (ref 22–32)
Calcium: 9 mg/dL (ref 8.9–10.3)
Chloride: 110 mmol/L (ref 98–111)
Creatinine, Ser: 4.8 mg/dL — ABNORMAL HIGH (ref 0.44–1.00)
GFR calc Af Amer: 11 mL/min — ABNORMAL LOW (ref >60)
GFR calc non Af Amer: 9 mL/min — ABNORMAL LOW (ref >60)
Glucose, Bld: 201 mg/dL — ABNORMAL HIGH (ref 70–99)
Phosphorus: 5.3 mg/dL — ABNORMAL HIGH (ref 2.5–4.6)
Potassium: 5 mmol/L (ref 3.5–5.1)
Sodium: 132 mmol/L — ABNORMAL LOW (ref 135–145)

## 2019-04-29 LAB — MAGNESIUM: Magnesium: 2.3 mg/dL (ref 1.7–2.4)

## 2019-04-29 LAB — STREP PNEUMONIAE URINARY ANTIGEN: Strep Pneumo Urinary Antigen: NEGATIVE

## 2019-04-29 MED ORDER — SODIUM BICARBONATE 650 MG PO TABS
1300.0000 mg | ORAL_TABLET | Freq: Three times a day (TID) | ORAL | Status: DC
  Administered 2019-04-29 – 2019-05-06 (×21): 1300 mg via ORAL
  Filled 2019-04-29 (×21): qty 2

## 2019-04-29 MED ORDER — POLYETHYLENE GLYCOL 3350 17 G PO PACK: 17.0000 g | PACK | Freq: Two times a day (BID) | ORAL | Status: DC | PRN

## 2019-04-29 NOTE — Progress Notes (Addendum)
Subjective: Pt seen at the bedside on AM rounds. Says she feels good today. Pain is well controlled. She does endorse more unproductive coughing today. We discussed the plan was to keep the abx going and transition to oral regimen so she can go home soon. She asked [place] to let her stay 1-2 more days, which we told her this was reasonable. She says she is thankful for the care we have given her and she wants a picture before we leave.   Objective:  Vital signs in last 24 hours: Vitals:   04/28/19 1302 04/28/19 1618 04/28/19 2009 04/29/19 0417  BP: (!) 144/61 (!) 144/62 (!) 153/71 (!) 144/56  Pulse: 88 86 94 92  Resp:  18 18 18   Temp:  98.9 F (37.2 C) 98.9 F (37.2 C) 98.1 F (36.7 C)  TempSrc:  Oral Oral Oral  SpO2: 95% 95% 93% (!) 86%  Weight:    60.9 kg   Physical Exam Vitals signs reviewed.  Constitutional:      General: She is not in acute distress.    Appearance: Normal appearance. She is normal weight. She is not toxic-appearing or diaphoretic.  HENT:     Head: Normocephalic and atraumatic.  Eyes:     General: No scleral icterus.       Right eye: No discharge.        Left eye: No discharge.     Extraocular Movements: Extraocular movements intact.  Cardiovascular:     Rate and Rhythm: Normal rate and regular rhythm.     Pulses: Normal pulses.     Heart sounds: Normal heart sounds. No murmur. No friction rub. No gallop.   Pulmonary:     Effort: Pulmonary effort is normal. No respiratory distress.     Breath sounds: No wheezing or rales.     Comments: Frequent coughs with respirations  Bilateral crackles appreciated at bases on auscultation, R>L Abdominal:     General: Bowel sounds are normal. There is no distension.     Palpations: Abdomen is soft.     Tenderness: There is no abdominal tenderness. There is no guarding.  Musculoskeletal:        General: No swelling or deformity.     Right lower leg: No edema.     Left lower leg: No edema.     Comments: Moves all  extremities antigravity   Skin:    General: Skin is warm.  Neurological:     Mental Status: She is alert and oriented to person, place, and time. Mental status is at baseline.  Psychiatric:        Mood and Affect: Mood normal.        Behavior: Behavior normal.        Thought Content: Thought content normal.        Judgment: Judgment normal.    Assessment/Plan:  Principal Problem:   Symptomatic anemia Active Problems:   Hypothyroidism, postradioiodine therapy   DM type 2, uncontrolled, with severe neuropathy   Nausea without vomiting   Anorexia  In summary, Ms. Shimko is a 58 year old lady with a history of HTN, T2DM, hypothyroidism, CKD stage IV, sickle cell trait, MDD, and fibromyalgia who presented to the hospital as a direct admission from outpatient clinic for presumed blood loss anemia. She presented with increased fatigue, generalized weakness and pain due to her fibromyalgia in addition to having black tarry stools over the last several weeks to months. In the clinic, her CBC was notable for a low  hemoglobin of 4.7 and leukocytosis of 18.3. She is now s/p 3U pRBC and H&H has been stable. She continues to have leukocytosis and elevated procalcitonin with bibasilar infiltrates, concerning for pneumonia.    #Symptomatic Anemia of Unclear Etiology: Hemoglobin was 4.7 on admission. Baseline around 8.0. S/p pRBC 3U. Hgb now 10.8. The patient has noted black tarry stools over the last several weeks that have correlated with her increased fatigue, generalized weakness, and increased pain. Other notable labs such as serum ferritin, iron, TIBC, reticulocyte count, LDH were also obtained. Iron studies consistent with iron deficiency anemia with the exception of an elevated ferritin (691). Bilirubin and LDH was normal, ruling out any hemolytic process. Reticulocyte index calculated and was severely low (1.8), underscoring a production problem. This makes sense given the patient's advanced kidney  disease. - GI did upper endoscopy. Their recs are as follows:             - Advance diet as tolerated.             - PPI BID for at least 4 weeks, then daily             thereafter.             - Avoid NSAIDs.             - Await biopsy results and to exclude viral esophagitis and H. Pylori (pending) - d/c Feraheme in the setting of pneumonia - Daily CBC   #Fever #Leukocytosis #Pnuemonia: Pt had white count of 18.3 in the clinic and 23.4 now . She also had a mild fever of 100.9 in office but resolved once she was admitted (98.9). Spiked fever again to 100.6 on day 1 of hospitalization but resolved and has been afebrile for the last several days. CXR (7/30) showed bibasilar infiltrates. Pt has crackles appreciated on pulmonary exam in addition to nonproductive cough. Ceftriaxone & azithromycin was started yesterday evening. - Daily CBC - Ceftriaxone & azithromycin (day 2/5)   #Depression #Allodynia  #Fibromyalgia: The patient has been prescribed gabapentin 600 mg QID and duloxetine 60 mg daily in the past, however the patient says she is does not take this currently.  It makes her feel spaced out and she forgets what happened the week prior. She is very reluctant to opoid use for pain management. On initial exam the slightest brush of her clothes or skin produces large amounts of pain. Today the patient is still moving around in bed freely and has much less pain. She is happy with the current pain regimen. - Tramadol 50 mg q8hr PRN - Tylenol 650 mg q6hr PRN   #CKD stage IV #AKI: Cr. 6.09 on admission, now 5.1, baseline around 3.0. Injury likely due to volume depletion and blood loss. The patient says she is okay with going on dialysis short term if it will be only used temorparily, but is not interested in being on dialysis long term for ESRD. Bicarb low at 10 today and has been persistently low. Nephrology on board, will appreciate recs: - Avoid PICC line placement due to potential dialysis  access options in the future. No indication for acute dialysis at this time. - Monitor with daily BMP   #HTN: BP was elevated at 173/74 at her clinic visit today prior to admission. She is prescribed amlodipine 5 mg daily as well as lasix 40 mg daily. - d/c Amlodipine and Lasix   #T2DM: T2DM with severe peripheral neuropathy listed in chart, however last A1c  4.8 in March 2020. Was as high as 9.3 in May 2015. - Lantus 10U nightly  - SSI sensitive + HS correction    #Hypothyroidism: Last TSH was highly elevated 196.14 at previous outpatient visit and 64.8 on admission She was prescribed Synthroid 150 mg daily on 02/26/2019. Unclear adherence given significantly elevated TSH. - Continue home Levothyroxine 150 mg, will need to follow closely after discharge   #FEN/GI #Hyponatremia: 132 on admission, stable, due to advanced renal disease #Hyperkalemia: 5.4 on admission. Resolved to 5.0 today. #Hypoalbuminemia: 2.2 on admission, 1.7 toady. #Hyperclacemia: Corrected level mildy elevated 10.7 on admission, normal at 10.1 today. - Full liquid diet - Zofran 4 mg injections q6hr PRN   - EKG today   #Code status: Pt stated she is okay with CPR, but does not want to be put an ventilator under any circumstance   Dispo: Discharge pending medical workup. Expected LOS 2 days. Pt expresses desire to see PT she already works with once discharged.  Earlene Plater, MD Internal Medicine, PGY1 Pager: [phone]  04/29/2019,1:58 PM

## 2019-04-29 NOTE — Progress Notes (Signed)
Patient ID: Kayla [name], female   DOB: 10-06-1960, 58 y.o.   MRN: [id number] [facility] Progress Note   Assessment/ Plan:   1.  Acute kidney injury on chronic kidney disease stage IV: Likely hemodynamically mediated by severe anemia/limited oral intake prior to presentation in the setting of progressive chronic kidney disease from poorly treated hypertension/diabetes.  Renal function appears to be improving and urine output charted appears inaccurate.  No acute indications for dialysis at this time.  Will begin sodium bicarbonate for metabolic acidosis (this will further help control her potassium level as well).  I discussed her case with Dr. Jimmy Footman who previously used to follow the patient. Unfortunately, she has posed several barriers to her care in the past that has limited her management including several missed appointments. 2.  Severe anemia: With significant iron deficiency status post replacement with Feraheme.  Status post PRBC transfusion and EGD showed signs of esophagitis. 3.  Hypertension: Continue to monitor blood pressures with volume expansion and resume antihypertensive therapy with the exception of RAS blocker/diuretics at this time. 4.  Hyponatremia: Secondary to impaired water handling with acute kidney injury and uncontrolled hypothyroidism.  Discussed continued fluid restriction/management of hypothyroidism. 5.  Hypothyroidism: Appears uncontrolled and indeed may be contributing to her acute kidney injury/hyponatremia.  Adjust levothyroxine dose. 6.  Fever/leukocytosis: Management per primary service.  Will sign off at this time with recommendations to continue sodium bicarbonate upon discharge.  I have scheduled her to see me in the office on 05/13/2019 at 4:00 p.m. with labs 1 week prior to that.  I will set her up for outpatient ESA injections/surveillance of iron stores following her visit with me.  Subjective:   Overnight events noted including EGD results  and problems with pain control.   Objective:   BP (!) 144/56 (BP Location: Left Arm)   Pulse 92   Temp 98.1 F (36.7 C) (Oral)   Resp 18   Wt 60.9 kg   LMP 01/12/2014   SpO2 (!) 86%   BMI 24.56 kg/m   Intake/Output Summary (Last 24 hours) at 04/29/2019 4193 Last data filed at 04/29/2019 0600 Gross per 24 hour  Intake 1695.99 ml  Output 200 ml  Net 1495.99 ml   Weight change: 1.9 kg  Physical Exam: Gen: Comfortably sleeping in bed, somewhat confused when awakened CVS: Pulse regular rhythm, normal rate, S1 and S2 normal Resp: Clear to auscultation bilaterally, no rales or rhonchi Abd: Soft, flat, nontender Ext: No lower extremity edema  Imaging: Dg Chest Port 1 View  Result Date: 04/28/2019 CLINICAL DATA:  Shortness of breath since yesterday. Sickle cell disease. EXAM: PORTABLE CHEST 1 VIEW COMPARISON:  07/03/2014 FINDINGS: Cardiomegaly. Pulmonary infiltrates in the mid and lower lungs bilaterally. Small amount of pleural fluid on the left. This could be secondary to acute chest syndrome. Ordinary bronchopneumonia is not excluded. IMPRESSION: Bilateral mid and lower lung infiltrates left worse than right. Possible left effusion. Differential diagnosis is acute chest syndrome versus infectious bronchopneumonia. Electronically Signed   By: Nelson Chimes M.D.   On: 04/28/2019 19:42   Vas [place] Upper Ext Vein Mapping (pre-op Avf)  Result Date: 04/28/2019 UPPER EXTREMITY VEIN MAPPING  Indications: History of PAD; patient is pre-operative for bypass. Comparison Study: no prior Performing Technologist: Abram Sander RVS  Examination Guidelines: A complete evaluation includes B-mode imaging, spectral Doppler, color Doppler, and power Doppler as needed of all accessible portions of each vessel. Bilateral testing is considered an integral part of a complete  examination. Limited examinations for reoccurring indications may be performed as noted. +-----------------+-------------+----------+--------+  Right Cephalic   Diameter (cm)Depth (cm)Findings +-----------------+-------------+----------+--------+ Shoulder             0.27        0.93            +-----------------+-------------+----------+--------+ Prox upper arm       0.23        0.70            +-----------------+-------------+----------+--------+ Mid upper arm        0.27        0.51            +-----------------+-------------+----------+--------+ Dist upper arm       0.38        0.48            +-----------------+-------------+----------+--------+ Antecubital fossa    0.71        0.19            +-----------------+-------------+----------+--------+ Prox forearm         0.37        0.37            +-----------------+-------------+----------+--------+ Mid forearm          0.20        0.26            +-----------------+-------------+----------+--------+ Dist forearm         0.16        0.27            +-----------------+-------------+----------+--------+ Wrist                0.11        0.23            +-----------------+-------------+----------+--------+ +-----------------+-------------+----------+--------------+ Right Basilic    Diameter (cm)Depth (cm)   Findings    +-----------------+-------------+----------+--------------+ Shoulder                                not visualized +-----------------+-------------+----------+--------------+ Prox upper arm                          not visualized +-----------------+-------------+----------+--------------+ Mid upper arm                           not visualized +-----------------+-------------+----------+--------------+ Dist upper arm                          not visualized +-----------------+-------------+----------+--------------+ Antecubital fossa    0.35        0.78                  +-----------------+-------------+----------+--------------+ Prox forearm         0.16        0.29     branching     +-----------------+-------------+----------+--------------+ Mid forearm                             not visualized +-----------------+-------------+----------+--------------+ Distal forearm                          not visualized +-----------------+-------------+----------+--------------+ Elbow  not visualized +-----------------+-------------+----------+--------------+ Wrist                                   not visualized +-----------------+-------------+----------+--------------+ +-----------------+-------------+----------+----------------------------+ Left Cephalic    Diameter (cm)Depth (cm)          Findings           +-----------------+-------------+----------+----------------------------+ Shoulder             0.09        0.45                                +-----------------+-------------+----------+----------------------------+ Prox upper arm                                 not visualized        +-----------------+-------------+----------+----------------------------+ Mid upper arm                                  not visualized        +-----------------+-------------+----------+----------------------------+ Dist upper arm                          branching and not visualized +-----------------+-------------+----------+----------------------------+ Antecubital fossa    0.54        0.32                                +-----------------+-------------+----------+----------------------------+ Prox forearm         0.20        0.26                                +-----------------+-------------+----------+----------------------------+ Mid forearm          0.26        0.30                                +-----------------+-------------+----------+----------------------------+ Dist forearm         0.24        0.24                                +-----------------+-------------+----------+----------------------------+  Wrist                0.16        0.23                                +-----------------+-------------+----------+----------------------------+ +-----------------+-------------+----------+--------------+ Left Basilic     Diameter (cm)Depth (cm)   Findings    +-----------------+-------------+----------+--------------+ Shoulder                                not visualized +-----------------+-------------+----------+--------------+ Prox upper arm                          not visualized +-----------------+-------------+----------+--------------+ Mid upper arm  not visualized +-----------------+-------------+----------+--------------+ Dist upper arm                          not visualized +-----------------+-------------+----------+--------------+ Antecubital fossa    0.27        0.62                  +-----------------+-------------+----------+--------------+ Prox forearm         0.26        0.31                  +-----------------+-------------+----------+--------------+ Mid forearm          0.16        0.18     branching    +-----------------+-------------+----------+--------------+ Distal forearm                          not visualized +-----------------+-------------+----------+--------------+ Elbow                                   not visualized +-----------------+-------------+----------+--------------+ Wrist                                   not visualized +-----------------+-------------+----------+--------------+ *See table(s) above for measurements and observations.  Diagnosing physician: Curt Jews MD Electronically signed by Curt Jews MD on 04/28/2019 at 3:19:36 PM.    Final     Labs: BMET Recent Labs  Lab 04/26/19 1212 04/27/19 0433 04/28/19 0410 04/29/19 0359  NA 132* 129* 133* 132*  K 5.4* 4.7 4.7 5.0  CL 109 109 113* 110  CO2 11* 11* 10* 11*  GLUCOSE 252* 178* 87 201*  BUN 70* 64* 62* 60*  CREATININE  6.09* 5.43* 5.10* 4.80*  CALCIUM 9.3 8.5* 9.1 9.0  PHOS  --   --  4.3 5.3*   CBC Recent Labs  Lab 04/26/19 1212 04/26/19 2110 04/27/19 0745 04/28/19 0410 04/29/19 0359  WBC 18.3*  --   --  24.4* 23.5*  NEUTROABS 15.8*  --   --   --  21.7*  HGB 4.7*  --  9.4* 10.8* 10.3*  HCT 14.6* 19.6* 27.5* 31.9* 30.8*  MCV 93.0  --   --  88.1 89.3  PLT 217  --   --  177 161    Medications:    . insulin aspart  0-5 Units Subcutaneous QHS  . insulin aspart  0-9 Units Subcutaneous TID WC  . insulin glargine  10 Units Subcutaneous QHS  . levothyroxine  150 mcg Oral QAC breakfast  . pantoprazole  40 mg Oral BID AC  . sodium chloride flush  10-40 mL Intracatheter Q12H  . sodium chloride flush  3 mL Intravenous Q12H   Elmarie Shiley, MD 04/29/2019, 8:22 AM

## 2019-04-29 NOTE — Progress Notes (Signed)
Physical Therapy Treatment Patient Details Name: Kayla [name] MRN: [id number] DOB: April 29, 1961 Today's Date: 04/29/2019    History of Present Illness Patient is a 58 year old female admitted with weakness. Diagnosed with symtomatic anemia hgb 4.9 when admitted. PMH to include fibromyalgia, CKD, sickle cell, DM, hypothyroidism, HTN.    PT Comments    Pt in bed on arrival agreeable to getting up to chair. Chair situated to left of bed and BSC to right. Pt then stated need to void first. Pt transferred from bed to [facility] with use of rW with pt resistant to all cues and education from P.T. and pt refusing guarding for stability despite pt report of fear of falling. Once on [facility] pt stating refusal to walk to recliner and when offered option of moving recliner to her pt became very agitated that she was being told to do more than she could even though additional pivot would be less effort then return to bed. Pt was unable to exhibit problem solving or rationalization for situation. Provided active listening for pt with repetition of pt statements to confirm understanding. Pt states need to visualize all movement before performing and increased time for all mobility. Pt exhibits self-limiting behavior but states she will be willing to get to chair next date and attempt walking with option of chair follow discussed. Pt very highly fluctuant between argumentative/agitated and pleasant/willing to participate.   Discussed with pt her current function and lack of mobility acutely and how that translates to D/C planning. Pt  stating she does not have help at home and probably should go to SNF but states she will refuse it.     Follow Up Recommendations  Home health PT;Supervision for mobility/OOB     Equipment Recommendations  Rolling walker with 5" wheels    Recommendations for Other Services       Precautions / Restrictions Precautions Precautions: Fall    Mobility  Bed Mobility Overal bed mobility:  Needs Assistance Bed Mobility: Supine to Sit;Sit to Supine     Supine to sit: Min assist Sit to supine: Supervision;HOB elevated   General bed mobility comments: pt refused to attempt exiting bed without HHA, HOB 20 degrees and use of RUE to pull up to right side of bed despite cues for LUE assist. Return to bed pt performed without assist but then stating she couldn't slide to [facility]. HOB flat with cues and tactile cue pt able to scoot to [facility]  Transfers Overall transfer level: Needs assistance   Transfers: Sit to/from Stand Sit to Stand: Supervision         General transfer comment: pt able to stand from bed and bSC without physical assist. pt refused close guarding as she reports fear of falling but doesn't want to feel like someone is manhandling her. Pt directing therapist to stand in front of her opposite Rw prior to standing.  Ambulation/Gait Ambulation/Gait assistance: Supervision Gait Distance (Feet): 3 Feet Assistive device: Rolling walker (2 wheeled) Gait Pattern/deviations: Step-to pattern   Gait velocity interpretation: 1.31 - 2.62 ft/sec, indicative of limited community ambulator General Gait Details: pt walked 3' bed<>BSC with RW with cues to step into RW and extend trunk but pt not receptive to cues   Stairs             Wheelchair Mobility    Modified Rankin (Stroke Patients Only)       Balance Overall balance assessment: Needs assistance   Sitting balance-Leahy Scale: Good  Standing balance-Leahy Scale: Poor Standing balance comment: pt reaching for environmental support with all standing                            Cognition Arousal/Alertness: Awake/alert Behavior During Therapy: WFL for tasks assessed/performed Overall Cognitive Status: Impaired/Different from baseline Area of Impairment: Problem solving;Safety/judgement                         Safety/Judgement: Decreased awareness of deficits;Decreased awareness  of safety   Problem Solving: Slow processing General Comments: pt self limiting      Exercises      General Comments        Pertinent Vitals/Pain Pain Score: 4  Pain Location: right chest Pain Descriptors / Indicators: Aching;Guarding Pain Intervention(s): Limited activity within patient's tolerance;Repositioned;Monitored during session    Home Living                      Prior Function            PT Goals (current goals can now be found in the care plan section) Progress towards PT goals: Progressing toward goals    Frequency    Min 3X/week      PT Plan Current plan remains appropriate    Co-evaluation              AM-PAC PT "6 Clicks" Mobility   Outcome Measure  Help needed turning from your back to your side while in a flat bed without using bedrails?: A Little Help needed moving from lying on your back to sitting on the side of a flat bed without using bedrails?: A Little Help needed moving to and from a bed to a chair (including a wheelchair)?: A Little Help needed standing up from a chair using your arms (e.g., wheelchair or bedside chair)?: A Little Help needed to walk in hospital room?: A Little Help needed climbing 3-5 steps with a railing? : A Lot 6 Click Score: 17    End of Session   Activity Tolerance: Patient limited by fatigue Patient left: in bed;with call bell/phone within reach Nurse Communication: Mobility status PT Visit Diagnosis: Muscle weakness (generalized) (M62.81);Difficulty in walking, not elsewhere classified (R26.2);Pain     Time: 3016-0109 PT Time Calculation (min) (ACUTE ONLY): 47 min  Charges:  $Therapeutic Activity: 38-52 mins                     Sakira Dahmer Pam Drown, PT Acute Rehabilitation Services Pager: [phone] Office: [place] 04/29/2019, 2:02 PM

## 2019-04-30 ENCOUNTER — Telehealth: Payer: Self-pay

## 2019-04-30 DIAGNOSIS — D62 Acute posthemorrhagic anemia: Secondary | ICD-10-CM

## 2019-04-30 LAB — RENAL FUNCTION PANEL
Albumin: 1.7 g/dL — ABNORMAL LOW (ref 3.5–5.0)
Anion gap: 12 (ref 5–15)
BUN: 68 mg/dL — ABNORMAL HIGH (ref 6–20)
CO2: 11 mmol/L — ABNORMAL LOW (ref 22–32)
Calcium: 9 mg/dL (ref 8.9–10.3)
Chloride: 109 mmol/L (ref 98–111)
Creatinine, Ser: 5.63 mg/dL — ABNORMAL HIGH (ref 0.44–1.00)
GFR calc Af Amer: 9 mL/min — ABNORMAL LOW (ref >60)
GFR calc non Af Amer: 8 mL/min — ABNORMAL LOW (ref >60)
Glucose, Bld: 122 mg/dL — ABNORMAL HIGH (ref 70–99)
Phosphorus: 5.8 mg/dL — ABNORMAL HIGH (ref 2.5–4.6)
Potassium: 5.5 mmol/L — ABNORMAL HIGH (ref 3.5–5.1)
Sodium: 132 mmol/L — ABNORMAL LOW (ref 135–145)

## 2019-04-30 LAB — CBC
HCT: 28.4 % — ABNORMAL LOW (ref 36.0–46.0)
Hemoglobin: 9.9 g/dL — ABNORMAL LOW (ref 12.0–15.0)
MCH: 30.7 pg (ref 26.0–34.0)
MCHC: 34.9 g/dL (ref 30.0–36.0)
MCV: 87.9 fL (ref 80.0–100.0)
Platelets: 166 10*3/uL (ref 150–400)
RBC: 3.23 MIL/uL — ABNORMAL LOW (ref 3.87–5.11)
RDW: 16.5 % — ABNORMAL HIGH (ref 11.5–15.5)
WBC: 23.8 10*3/uL — ABNORMAL HIGH (ref 4.0–10.5)
nRBC: 0.1 % (ref 0.0–0.2)

## 2019-04-30 LAB — GLUCOSE, CAPILLARY
Glucose-Capillary: 48 mg/dL — ABNORMAL LOW (ref 70–99)
Glucose-Capillary: 57 mg/dL — ABNORMAL LOW (ref 70–99)
Glucose-Capillary: 74 mg/dL (ref 70–99)
Glucose-Capillary: 136 mg/dL — ABNORMAL HIGH (ref 70–99)
Glucose-Capillary: 99 mg/dL (ref 70–99)
Glucose-Capillary: 100 mg/dL — ABNORMAL HIGH (ref 70–99)

## 2019-04-30 LAB — LEGIONELLA PNEUMOPHILA SEROGP 1 UR AG: L. pneumophila Serogp 1 Ur Ag: NEGATIVE

## 2019-04-30 LAB — CK: Total CK: 21 U/L — ABNORMAL LOW (ref 38–234)

## 2019-04-30 LAB — PROCALCITONIN: Procalcitonin: 37.94 ng/mL

## 2019-04-30 LAB — T4, FREE: Free T4: 0.91 ng/dL (ref 0.61–1.12)

## 2019-04-30 MED ORDER — GLUCOSE 40 % PO GEL
ORAL | Status: AC
  Administered 2019-04-30: 37.5 g
  Filled 2019-04-30: qty 1

## 2019-04-30 MED ORDER — INSULIN GLARGINE 100 UNIT/ML ~~LOC~~ SOLN
5.0000 [IU] | Freq: Every day | SUBCUTANEOUS | Status: DC
  Administered 2019-04-30 – 2019-05-01 (×2): 5 [IU] via SUBCUTANEOUS
  Filled 2019-04-30 (×3): qty 0.05

## 2019-04-30 MED ORDER — LACTATED RINGERS IV SOLN: INTRAVENOUS | Status: DC

## 2019-04-30 MED ORDER — SODIUM CHLORIDE 0.9 % IV SOLN
INTRAVENOUS | Status: AC
  Administered 2019-04-30: 18:00:00 via INTRAVENOUS

## 2019-04-30 MED ORDER — POLYETHYLENE GLYCOL 3350 17 G PO PACK
17.0000 g | PACK | Freq: Every day | ORAL | Status: DC
  Administered 2019-04-30 – 2019-05-02 (×3): 17 g via ORAL
  Filled 2019-04-30 (×6): qty 1

## 2019-04-30 MED ORDER — AZITHROMYCIN 250 MG PO TABS
250.0000 mg | ORAL_TABLET | Freq: Every day | ORAL | Status: AC
  Administered 2019-04-30 – 2019-05-02 (×3): 250 mg via ORAL
  Filled 2019-04-30 (×3): qty 1

## 2019-04-30 MED ORDER — SENNOSIDES-DOCUSATE SODIUM 8.6-50 MG PO TABS
1.0000 | ORAL_TABLET | Freq: Two times a day (BID) | ORAL | Status: DC
  Administered 2019-04-30 – 2019-05-03 (×7): 1 via ORAL
  Filled 2019-04-30 (×8): qty 1

## 2019-04-30 MED ORDER — GLUCAGON HCL RDNA (DIAGNOSTIC) 1 MG IJ SOLR
INTRAMUSCULAR | Status: AC
  Filled 2019-04-30: qty 1

## 2019-04-30 NOTE — Progress Notes (Signed)
OT Cancellation Note  Patient Details Name: Kayla [name] MRN: [id number] DOB: 1961-07-18   Cancelled Treatment:    Reason Eval/Treat Not Completed: Patient declined, no reason specified. As soon as therapy entered the room, Pt became irate, cussing at therapist, and overall very agitated. Stated very clearly that she does not want to work with therapy at all today.  Her current discharge plan should be reconsidered by the medical team, I question her ability to make safe decisions and discharge home safely. To my knowledge she has only been OOB once (yesterday) with PT for a BSC transfer and otherwise has declined any other OOB activity.   Merri Ray Ryman Rathgeber 04/30/2019, 11:16 AM   Hulda Humphrey OTR/L Acute Rehabilitation Services Pager: [phone] Office: [phone]

## 2019-04-30 NOTE — Telephone Encounter (Signed)
Left message for patient to call back  

## 2019-04-30 NOTE — Progress Notes (Signed)
PT Cancellation Note  Patient Details Name: Kayla [name] MRN: [id number] DOB: 1961/02/24   Cancelled Treatment:    Reason Eval/Treat Not Completed: Patient declined, no reason specified. Entered pt room to pt yelling "get the F out". Pt states she doesn't feel well and will not do anything. Pt declined to work with any therapy today.    Sandy Salaam Havanah Nelms 04/30/2019, 11:00 AM  Elwyn Reach, PT Acute Rehabilitation Services Pager: [phone] Office: [phone]

## 2019-04-30 NOTE — Progress Notes (Signed)
Subjective: Pt seen at the bedside on AM rounds today. She says that she feels worse overall. Still coughing and her pain is increased all over her body.   Objective:  Vital signs in last 24 hours: Vitals:   04/29/19 1610 04/29/19 1954 04/30/19 0620 04/30/19 0736  BP: (!) 149/70 (!) 149/65 (!) 145/63   Pulse: 88 93 92   Resp: 18 20 20    Temp:  99.1 F (37.3 C) 98.3 F (36.8 C)   TempSrc:  Oral Oral   SpO2: (!) 89% 91% (!) 84%   Weight:    60.5 kg   Physical Exam Constitutional:      General: She is in acute distress.     Appearance: She is not ill-appearing, toxic-appearing or diaphoretic.  HENT:     Head: Normocephalic and atraumatic.  Eyes:     General: No scleral icterus.       Right eye: No discharge.        Left eye: Discharge (dried, crusty dicharge at the L outter corner of the eye) present.    Extraocular Movements: Extraocular movements intact.  Cardiovascular:     Rate and Rhythm: Normal rate and regular rhythm.     Pulses: Normal pulses.     Heart sounds: Normal heart sounds. No murmur. No friction rub. No gallop.   Pulmonary:     Effort: Pulmonary effort is normal. No respiratory distress.     Breath sounds: Normal breath sounds. No wheezing or rales.     Comments: Diminished lung sounds, however difficult to appreciate due to pt's inability to sit up today Abdominal:     General: Abdomen is flat. Bowel sounds are normal. There is no distension.     Palpations: Abdomen is soft.     Tenderness: There is no abdominal tenderness. There is no guarding.  Musculoskeletal:     Right lower leg: No edema.     Left lower leg: No edema.     Comments: Diffuse weakness secondary to generalized pain   Skin:    Coloration: Skin is not jaundiced.  Neurological:     General: No focal deficit present.     Mental Status: She is alert and oriented to person, place, and time.  Psychiatric:        Mood and Affect: Mood normal.    Assessment/Plan:  Principal Problem:   Symptomatic anemia Active Problems:   Hypothyroidism, postradioiodine therapy   DM type 2, uncontrolled, with severe neuropathy   Nausea without vomiting   Anorexia   CAP (community acquired pneumonia)  In summary, Ms. Freeze is a 58 year old lady with a history of HTN, T2DM, hypothyroidism, CKDstage IV, sickle cell trait, MDD, and fibromyalgia who presented to the hospital as a direct admission from outpatient clinic for presumed blood loss anemia.She presented with increased fatigue, generalized weakness and pain due to her fibromyalgia in addition to having black tarry stools over the last several weeks to months. In the clinic, her CBC was notable for a low hemoglobin of 4.7 and leukocytosis of 18.3.She is now s/p 3U pRBC and H&H has been stable. She continues to have leukocytosis and elevated procalcitonin with bibasilar infiltrates, concerning for pneumonia.   #Symptomatic Anemiaof Unclear Etiology:Hemoglobin was 4.7on admission.Baseline around 8.0.S/p pRBC 3U.Hgb went to 10.8 and has downtrended to 9.9.The patient has noted black tarry stools over the last several weeks that have correlated with her increased fatigue, generalized weakness, and increased pain.Other notable labssuch as serum ferritin, iron, TIBC, reticulocyte  count, LDH were also obtained.Iron studies consistent with iron deficiency anemia with the exception of an elevated ferritin (691).Bilirubin and LDH was normal, ruling out any hemolytic process.Reticulocyte index calculated and was severely low (1.8),underscoring a production problem. This makes sense given the patient's advanced kidney disease. -GI did upper endoscopy. Their recs are as follows: - Advance diet as tolerated. - PPI BID for at least 4 weeks, then daily thereafter. - Avoid NSAIDs. - Await biopsyresults and to exclude viral esophagitis and H. Pylori (pending) - d/c Feraheme in the  setting of pneumonia -Daily CBC to monitor Hgb  #Fever #Leukocytosis #Pnuemonia: Pt had white count of 18.3 in the clinicand 23.4 now. She also had a mild fever of 100.9 in office but resolved once she was admitted (98.9).Spiked fever again to 100.6on day 1 of hospitalizationbut resolved and has been afebrile for the last several days. CXR (7/30) showed bibasilar infiltrates. Pt has crackles appreciated on pulmonary exam in addition to nonproductive cough. Ceftriaxone & azithromycin was started yesterday evening. -DailyCBC - Continue Ceftriaxone & azithromycin (day 3/5)  #Depression #Allodynia  #Fibromyalgia:The patient has been prescribed gabapentin600 mg QIDand duloxetine 60 mg dailyin the past, however the patient says she is does not take this currently. It makes her feel spaced out and she forgets what happened the week prior. She is very reluctant to opoid use for pain management. Oninitialexam the slightest brush of her clothes or skin produces large amounts of pain.Today the patient isstillmoving around in bed freely and has much less pain. She is happy with the current pain regimen. -Tramadol 50 mg q8hr PRN -Tylenol 650 mg q6hr PRN  #CKD stage IV #AKI: Cr. 6.09 on admission,now 5.63 from 4.80 yesterday. Baseline around 3.0. Injury likely due to volume depletion and blood loss. The patient says she is okay with going on dialysis short term if it will be only used temorparily, but is not interested in being on dialysis long term for ESRD.Bicarb low at 10 today and has been persistently low.Nephrology on board, will appreciate recs: - Avoid PICC line placement due to potential dialysis access options in the future. No indication for acute dialysis at this time. - Monitor with daily BMP - Start LR infusion 100 mL/hr today  #HTN: BP was elevated at 173/74 at her clinic visit today prior to admission. She is prescribed amlodipine 5 mg daily as well as lasix 40 mg  daily. Bp stable.  - d/c Amlodipine and Lasix  #T2DM: T2DM with severe peripheral neuropathy listed in chart, however last A1c 4.8 in March 2020. Was as high as 9.3 in May 2015. Had some hypoglycemia to 47 this AM.  - Decrease Lantus to 5U nightly from 10U, per diabetes management recs - SSI sensitive + HS correction   #Hypothyroidism: Last TSH was highly elevated 196.14 at previous outpatient visitand 64.8 on admissionShe was prescribed Synthroid 150 mg daily on 02/26/2019. Unclear adherence given significantly elevated TSH. - Continue home Levothyroxine 150 mg, will need to follow closely after discharge  #FEN/GI #Hyponatremia: 132on admission, stable, due to advanced renal disease #Hyperkalemia: 5.4 on admission. Resolved to 5.0, now 5.5 on today's labs. IVF as mentioned above and f/u on BMP #Hypoalbuminemia: 2.2 on admission, 1.7 toady. - Full liquid diet - Zofran 4 mg injections q6hr PRN  #Code status: Pt stated she is okay with CPR, but does not want to be put an ventilator under any circumstance  Dispo:Discharge pending medical workup. Expected LOS 2 days.Pt expresses desire to see PT  she already works with once discharged.  Earlene Plater, MD Internal Medicine, PGY1 Pager: [phone]  04/30/2019,3:16 PM

## 2019-04-30 NOTE — Progress Notes (Signed)
Inpatient Diabetes Program Recommendations  AACE/ADA: New Consensus Statement on Inpatient Glycemic Control (2015)  Target Ranges:  Prepandial:   less than 140 mg/dL      Peak postprandial:   less than 180 mg/dL (1-2 hours)      Critically ill patients:  140 - 180 mg/dL   Lab Results  Component Value Date   GLUCAP 136 (H) 04/30/2019   HGBA1C 5.0 04/26/2019    Review of Glycemic Control Results for JULLIETTE, FRENTZ (MRN [id number]) as of 04/30/2019 12:08  Ref. Range 04/30/2019 05:57 04/30/2019 06:29 04/30/2019 07:28 04/30/2019 11:05  Glucose-Capillary Latest Ref Range: 70 - 99 mg/dL 57 (L) 48 (L) 74 136 (H)   Diabetes history: Type 2 DM Outpatient Diabetes medications: Basaglar 20 units QD Current orders for Inpatient glycemic control: Lantus 10 units QHS, Novolog 0-9 units TID, Novolog 0-5 units QHS  Inpatient Diabetes Program Recommendations:    Noted patient experienced hypoglycemic event of 48 mg/dL, assuming this is related to Lantus dose and elevated Cr.   Would consider decreasing Lantus to 5 units QHS.   Thanks, Bronson Curb, MSN, RNC-OB Diabetes Coordinator [phone] (8a-5p)

## 2019-04-30 NOTE — Telephone Encounter (Signed)
-----  Message from Irving Copas., MD sent at 04/28/2019 12:49 PM EDT ----- Regarding: Follow up Kayla [name], I hope you are well. The patient you have previously met years ago for colonoscopy. This patient presented with a few weeks of dark stools and was found to have a significant anemia. Anemia multifactorial, as she has chronic renal insufficiency stage V and is not getting any Epogen at this time.No bowel movements in a while though as she has chronic constipation.She has significant fibromyalgia.Endo done today showing evidence of gastritis and some esophagitis and a duodenal ulcer which we feel is the likely source of GI bleeding.Will remain on high-dose PPI therapy.Probably will benefit from follow-up in the course of the coming weeks.If progressive changes in her hemoglobin then may consider role of repeat EGD/updated colonoscopy. Sheri, can you set up a follow-up in clinic in approximately 6-8 weeks based on availability? She has a PCP who can follow her in the interim.Thank you. GM

## 2019-05-01 LAB — RENAL FUNCTION PANEL
Albumin: 1.7 g/dL — ABNORMAL LOW (ref 3.5–5.0)
Anion gap: 9 (ref 5–15)
BUN: 66 mg/dL — ABNORMAL HIGH (ref 6–20)
CO2: 13 mmol/L — ABNORMAL LOW (ref 22–32)
Calcium: 9.1 mg/dL (ref 8.9–10.3)
Chloride: 113 mmol/L — ABNORMAL HIGH (ref 98–111)
Creatinine, Ser: 5.41 mg/dL — ABNORMAL HIGH (ref 0.44–1.00)
GFR calc Af Amer: 9 mL/min — ABNORMAL LOW (ref >60)
GFR calc non Af Amer: 8 mL/min — ABNORMAL LOW (ref >60)
Glucose, Bld: 79 mg/dL (ref 70–99)
Phosphorus: 5.5 mg/dL — ABNORMAL HIGH (ref 2.5–4.6)
Potassium: 5 mmol/L (ref 3.5–5.1)
Sodium: 135 mmol/L (ref 135–145)

## 2019-05-01 LAB — GLUCOSE, CAPILLARY
Glucose-Capillary: 72 mg/dL (ref 70–99)
Glucose-Capillary: 71 mg/dL (ref 70–99)
Glucose-Capillary: 69 mg/dL — ABNORMAL LOW (ref 70–99)
Glucose-Capillary: 81 mg/dL (ref 70–99)
Glucose-Capillary: 70 mg/dL (ref 70–99)

## 2019-05-01 LAB — T3: T3, Total: 28 ng/dL — ABNORMAL LOW (ref 71–180)

## 2019-05-01 LAB — CBC
HCT: 34.1 % — ABNORMAL LOW (ref 36.0–46.0)
Hemoglobin: 11.3 g/dL — ABNORMAL LOW (ref 12.0–15.0)
MCH: 29.7 pg (ref 26.0–34.0)
MCHC: 33.1 g/dL (ref 30.0–36.0)
MCV: 89.7 fL (ref 80.0–100.0)
Platelets: 139 10*3/uL — ABNORMAL LOW (ref 150–400)
RBC: 3.8 MIL/uL — ABNORMAL LOW (ref 3.87–5.11)
RDW: 16.9 % — ABNORMAL HIGH (ref 11.5–15.5)
WBC: 23.7 10*3/uL — ABNORMAL HIGH (ref 4.0–10.5)
nRBC: 0 % (ref 0.0–0.2)

## 2019-05-01 NOTE — Progress Notes (Signed)
Subjective: Pt seen at the bedside on morning rounds today. She states she is still having generalized pain and points to R side of abdomen. Denies cough or shortness of breath.  Objective:  Vital signs in last 24 hours: Vitals:   04/30/19 1102 04/30/19 1948 05/01/19 0529 05/01/19 0539  BP: (!) 156/76 (!) 147/64 140/63   Pulse: 85 80 88   Resp: 20 20 20    Temp: 97.9 F (36.6 C) 97.6 F (36.4 C) (!) 97.3 F (36.3 C)   TempSrc: Oral Oral Oral   SpO2: 95% (!) 88% 92%   Weight:    63.6 kg   Physical Exam Vitals signs reviewed.  Constitutional:      General: She is not in acute distress. HENT:     Head: Normocephalic and atraumatic.     Mouth/Throat:     Mouth: Mucous membranes are moist.  Eyes:     Extraocular Movements: Extraocular movements intact.  Cardiovascular:     Rate and Rhythm: Normal rate and regular rhythm.     Heart sounds: Normal heart sounds. No murmur. No friction rub. No gallop.   Pulmonary:     Effort: Pulmonary effort is normal.     Breath sounds: No wheezing, rhonchi or rales.     Comments: On 2L [place]. Coarse breath sounds noted at the bases. Abdominal:     General: Bowel sounds are normal. There is no distension.     Palpations: Abdomen is soft.  Skin:    General: Skin is warm and dry.  Neurological:     Mental Status: She is alert.    Assessment/Plan:  Principal Problem:   Symptomatic anemia Active Problems:   Hypothyroidism, postradioiodine therapy   DM type 2, uncontrolled, with severe neuropathy   Nausea without vomiting   Anorexia   CAP (community acquired pneumonia)  Assessment - Ms. Whiteman is a 58 year old lady with a history of HTN, T2DM, hypothyroidism, CKDstage IV, sickle cell trait, MDD, and fibromyalgia who presented to the hospital as a direct admission from outpatient clinic for presumed blood loss anemia.She presented with increased fatigue, generalized weakness and pain due to her fibromyalgia in addition to having black tarry  stools over the last several weeks to months. In the clinic, her CBC was notable for a low hemoglobin of 4.7 and leukocytosis of 18.3.She is now s/p 3U pRBC and H&H has been stable. She continues to have leukocytosis and elevated procalcitonin with bibasilar infiltrates, concerning for pneumonia.   Plan - 1. Symptomatic Anemiaof Unclear Etiology:Hemoglobin was 4.7on admission.Baseline around 8.0.S/p pRBC 3U.Hgb went to 10.8 and is up to 11.3 this morning. The patient has noted black tarry stools over the last several weeks that have correlated with her increased fatigue, generalized weakness, and increased pain.Iron studies consistent with iron deficiency anemia with the exception of an elevated ferritin (691).Reticulocyte index calculated and was severely low (1.8),underscoring a production problem consistent with pt's advanced kidney disease. -GI did upper endoscopy on 7/28. Appreciate their recs: - advance diet as tolerated. - PPI BID for at least 4 weeks (started on 7/29), then daily thereafter. - avoid NSAIDs. - biopsyresults positive for H. Pylori - already on abx, will modify when CAP coverage completed - d/c IV iron in the setting of pneumonia -daily CBC to monitor Hgb   2. Pnuemonia - Pt had white count of 18.3 in the clinicand 23.7 now. She has been afebrile since day 1 of hospitalization (7/27) when temp was 100.6. CXR (7/30) showed bibasilar  infiltrates. Pt has course crackles on pulmonary exam though denying cough at this time.  -dailyCBC - continue ceftriaxone & azithromycin (day 4 of 5) for CAP coverage   3. Fibromyalgia -The patient has been prescribed gabapentin600 mg QIDand duloxetine 60 mg dailyin the past, however the patient says she is does not take this currently. It makes her feel spaced out and she forgets what happened the week prior. She is very reluctant to opioid use for pain management. Oninitialexam the  slightest brush of her clothes or skin produces large amounts of pain.Today the patient wasmoving around in bed freely and has much less pain.  - pt denies PT/OT referral at this time, expresses desire to see PT she already works with upon discharged -tramadol 50 mg PO q12h PRN - acetaminophen 650 mg PO q6h PRN   4. AKI on top of CKD stage IV - Cr. 6.09 on admission,now 5.41 down from 5.63 yesterday. Baseline around 3.0. Injury likely due to volume depletion and blood loss. The patient says she is okay with going on dialysis short term if it will be only used temporary, but is not interested in being on dialysis long term for ESRD.Bicarb mildly improved today to 13 from 11, and has been persistently low. - monitor with daily BMP Seen by nephrology, appreciate recs: - avoid PICC line placement due to potential dialysis access options in the future. No indication for acute dialysis at this time. - continue sodium bicarbonate 1300mg  PO TID - outpatient follow-up with nephro scheduled for 05/13/19 with labs 1 week prior   5. HTN: BP was elevated at 173/74 in clinic prior to admission. She is prescribed amlodipine 5 mg daily as well as lasix 40 mg daily. Blood pressure stable in 140s/60s without medications.  - holding home amlodipine and lasix   6. T2DM: T2DM with severe peripheral neuropathy listed in chart, however last A1c 4.8 in March 2020. Had some hypoglycemia to 47 yesterday AM (7/31).  - decreased Lantus to 5U QHS, per diabetes management recs - SSI sensitive + HS correction    7. Hypothyroidism: Last TSH was highly elevated 196.14 at previous outpatient visitand 64.8 on admission.She was prescribed levothyroxine 150 mg daily on 02/26/2019. Unclear adherence given significantly elevated TSH. - continue home levothyroxine 150 mg, will need to follow closely after discharge   #FEN/GI #Hyponatremia: 132on admission, due to advanced renal disease, now resolved with Na 135  today #Hyperkalemia: 5.4 on admission and 5.5 yesterday, now resolved back to 5.0 today. #Hypoalbuminemia: 2.2 on admission, and 1.7 toady - zofran 4 mg injections q6hr PRN   Diet - full liquid diet Fluids - none DVT ppx - SCDs in place CODE STATUS - PARTIAL CODE Pt stated she is okay with CPR, but does not want to be put an ventilator under any circumstance  Dispo:Discharge pending medical stabilization. Expected LOS 2 days.  Ladona Horns, MD Internal Medicine, PGY1 Pager: [phone]  05/01/2019,8:41 AM

## 2019-05-01 NOTE — Progress Notes (Signed)
Hypoglycemic Event  CBG: 69  Treatment: 4 oz juice/soda  Symptoms: None  Follow-up CBG: Time:1704 CBG Result:81  Possible Reasons for Event: Inadequate meal intake and Unknown  Patient has been refusing to eat  Comments/MD notified: MD notified    Mar Daring

## 2019-05-02 LAB — RENAL FUNCTION PANEL
Albumin: 1.7 g/dL — ABNORMAL LOW (ref 3.5–5.0)
Anion gap: 12 (ref 5–15)
BUN: 70 mg/dL — ABNORMAL HIGH (ref 6–20)
CO2: 13 mmol/L — ABNORMAL LOW (ref 22–32)
Calcium: 9.2 mg/dL (ref 8.9–10.3)
Chloride: 110 mmol/L (ref 98–111)
Creatinine, Ser: 5.45 mg/dL — ABNORMAL HIGH (ref 0.44–1.00)
GFR calc Af Amer: 9 mL/min — ABNORMAL LOW (ref >60)
GFR calc non Af Amer: 8 mL/min — ABNORMAL LOW (ref >60)
Glucose, Bld: 100 mg/dL — ABNORMAL HIGH (ref 70–99)
Phosphorus: 4.9 mg/dL — ABNORMAL HIGH (ref 2.5–4.6)
Potassium: 4.7 mmol/L (ref 3.5–5.1)
Sodium: 135 mmol/L (ref 135–145)

## 2019-05-02 LAB — CBC
HCT: 29.2 % — ABNORMAL LOW (ref 36.0–46.0)
Hemoglobin: 9.9 g/dL — ABNORMAL LOW (ref 12.0–15.0)
MCH: 29.5 pg (ref 26.0–34.0)
MCHC: 33.9 g/dL (ref 30.0–36.0)
MCV: 86.9 fL (ref 80.0–100.0)
Platelets: 113 10*3/uL — ABNORMAL LOW (ref 150–400)
RBC: 3.36 MIL/uL — ABNORMAL LOW (ref 3.87–5.11)
RDW: 16.7 % — ABNORMAL HIGH (ref 11.5–15.5)
WBC: 22.6 10*3/uL — ABNORMAL HIGH (ref 4.0–10.5)
nRBC: 0.1 % (ref 0.0–0.2)

## 2019-05-02 LAB — GLUCOSE, CAPILLARY
Glucose-Capillary: 116 mg/dL — ABNORMAL HIGH (ref 70–99)
Glucose-Capillary: 93 mg/dL (ref 70–99)
Glucose-Capillary: 83 mg/dL (ref 70–99)

## 2019-05-02 MED ORDER — AMLODIPINE BESYLATE 5 MG PO TABS
5.0000 mg | ORAL_TABLET | Freq: Every day | ORAL | Status: DC
  Administered 2019-05-02 – 2019-05-06 (×5): 5 mg via ORAL
  Filled 2019-05-02 (×5): qty 1

## 2019-05-02 MED ORDER — INSULIN ASPART 100 UNIT/ML ~~LOC~~ SOLN
2.0000 [IU] | Freq: Three times a day (TID) | SUBCUTANEOUS | Status: DC
  Administered 2019-05-05 – 2019-05-06 (×4): 2 [IU] via SUBCUTANEOUS

## 2019-05-02 NOTE — Progress Notes (Signed)
Subjective: Pt reports being upset, says we were bothering her last night.  She says she urinated the bed this morning and that is our fault as well.  She stated she was feeling better but now worse because "everyone's bothering me".  She then demanded that we leave.    Objective:  Vital signs in last 24 hours: Vitals:   05/01/19 1155 05/01/19 2022 05/02/19 0355 05/02/19 1223  BP: (!) 151/63 (!) 158/82 (!) 152/86 (!) 167/81  Pulse: 77 80 82 82  Resp: 16 18 18 19   Temp: 97.6 F (36.4 C) 97.6 F (36.4 C) 97.9 F (36.6 C) (!) 97.3 F (36.3 C)  TempSrc: Oral Oral Oral Oral  SpO2: 95% 95% 93% 99%  Weight:   62.3 kg    General: pt upset, taken off all clothes and bed sheets which were thrown on floor, she is in no distress Pulm: no increased wob, normal RR    Assessment/Plan:  Principal Problem:   Symptomatic anemia Active Problems:   Hypothyroidism, postradioiodine therapy   DM type 2, uncontrolled, with severe neuropathy   Nausea without vomiting   Anorexia   CAP (community acquired pneumonia)  Assessment - Kayla [name] is a 58 year old lady with a history of HTN, T2DM, hypothyroidism, CKDstage IV, sickle cell trait, MDD, and fibromyalgia who presented to the hospital as a direct admission from outpatient clinic for presumed blood loss anemia.She presented with increased fatigue, generalized weakness and pain due to her fibromyalgia in addition to having black tarry stools over the last several weeks to months. In the clinic, her CBC was notable for a low hemoglobin of 4.7 and leukocytosis of 18.3.She is now s/p 3U pRBC and H&H has been stable. She continues to have leukocytosis and elevated procalcitonin with bibasilar infiltrates, concerning for pneumonia.   Plan - 1. Symptomatic Anemiaof Unclear Etiology:Hemoglobin was 4.7on admission.Baseline around 8.0.S/p pRBC 3U.Hgb went to 10.8 and is up to 11.3 this morning. The patient has noted black tarry stools over the last  several weeks that have correlated with her increased fatigue, generalized weakness, and increased pain.Iron studies consistent with iron deficiency anemia with the exception of an elevated ferritin (691).Reticulocyte index calculated and was severely low (1.8),underscoring a production problem consistent with pt's advanced kidney disease. GI did upper endoscopy on 7/28. biopsyresults positive for H. Pylori   - already on abx, will modify when CAP coverage completed -PPI BID for at least 4 weeks (started on 7/29), then daily thereafter   2. CAP - Pt had white count of 18.3 in the clinicand 23.7 now. She has been afebrile since day 1 of hospitalization (7/27) when temp was 100.6. CXR (7/30) showed bibasilar infiltrates. Remains afebrile on treatment  -dailyCBC - continue ceftriaxone & azithromycin (day 5 of 5) for CAP coverage   3. Fibromyalgia -The patient has been prescribed gabapentin600 mg QIDand duloxetine 60 mg dailyin the past, however the patient says she is does not take this currently. It makes her feel spaced out and she forgets what happened the week prior. She is very reluctant to opioid use for pain management. Waxes and wanes does not want cymbalta.  Refusing physical therapy while inpatient  -tramadol 50 mg PO q12h PRN - acetaminophen 650 mg PO q6h PRN   4. AKI on top of CKD stage IV - Cr. 6.09 on admission, baseline around 3.  Likely due to volume depletion, poor compliance and blood loss. The patient says she is okay with going on dialysis short term  if it will be only used temporary, but is not interested in being on dialysis long term for ESRD.  - continue sodium bicarbonate 1300mg  PO TID - outpatient follow-up with nephro scheduled for 05/13/19 with labs 1 week prior   5. HTN: BP was elevated at 173/74 in clinic prior to admission. She is prescribed amlodipine 5 mg daily as well as lasix 40 mg daily. Blood pressure initially stable in 140s/60s without  medications.   -Hypertensive today, will start back amlodipine   6. T2DM: T2DM with severe peripheral neuropathy listed in chart, however last A1c 4.8 in March 2020. Had some hypoglycemia to 47 yesterday AM (7/31).  - d/ced lantus added 2 u novolog with meals - SSI sensitive + HS correction    7. Hypothyroidism: Last TSH was highly elevated 196.14 at previous outpatient visitand 64.8 on admission.She was prescribed levothyroxine 150 mg daily on 02/26/2019. Unclear adherence given significantly elevated TSH. - continue home levothyroxine 150 mg, will need to follow closely after discharge    Diet - full liquid diet Fluids - none DVT ppx - SCDs in place CODE STATUS - PARTIAL CODE Pt stated she is okay with CPR, but does not want to be put an ventilator under any circumstance  Dispo:Discharge pending medical stabilization. Expected LOS 2 days.  Kayla Muff MD PGY-3 Internal Medicine Pager # [phone]

## 2019-05-03 ENCOUNTER — Other Ambulatory Visit: Payer: Self-pay

## 2019-05-03 DIAGNOSIS — K5900 Constipation, unspecified: Secondary | ICD-10-CM

## 2019-05-03 DIAGNOSIS — R451 Restlessness and agitation: Secondary | ICD-10-CM

## 2019-05-03 LAB — GLUCOSE, CAPILLARY
Glucose-Capillary: 97 mg/dL (ref 70–99)
Glucose-Capillary: 67 mg/dL — ABNORMAL LOW (ref 70–99)
Glucose-Capillary: 91 mg/dL (ref 70–99)
Glucose-Capillary: 124 mg/dL — ABNORMAL HIGH (ref 70–99)
Glucose-Capillary: 150 mg/dL — ABNORMAL HIGH (ref 70–99)
Glucose-Capillary: 109 mg/dL — ABNORMAL HIGH (ref 70–99)

## 2019-05-03 LAB — CBC
HCT: 28.8 % — ABNORMAL LOW (ref 36.0–46.0)
Hemoglobin: 10 g/dL — ABNORMAL LOW (ref 12.0–15.0)
MCH: 30.6 pg (ref 26.0–34.0)
MCHC: 34.7 g/dL (ref 30.0–36.0)
MCV: 88.1 fL (ref 80.0–100.0)
Platelets: 99 10*3/uL — ABNORMAL LOW (ref 150–400)
RBC: 3.27 MIL/uL — ABNORMAL LOW (ref 3.87–5.11)
RDW: 17.1 % — ABNORMAL HIGH (ref 11.5–15.5)
WBC: 22 10*3/uL — ABNORMAL HIGH (ref 4.0–10.5)
nRBC: 0 % (ref 0.0–0.2)

## 2019-05-03 LAB — RENAL FUNCTION PANEL
Albumin: 1.7 g/dL — ABNORMAL LOW (ref 3.5–5.0)
Anion gap: 12 (ref 5–15)
BUN: 64 mg/dL — ABNORMAL HIGH (ref 6–20)
CO2: 14 mmol/L — ABNORMAL LOW (ref 22–32)
Calcium: 9.2 mg/dL (ref 8.9–10.3)
Chloride: 108 mmol/L (ref 98–111)
Creatinine, Ser: 5.04 mg/dL — ABNORMAL HIGH (ref 0.44–1.00)
GFR calc Af Amer: 10 mL/min — ABNORMAL LOW (ref >60)
GFR calc non Af Amer: 9 mL/min — ABNORMAL LOW (ref >60)
Glucose, Bld: 68 mg/dL — ABNORMAL LOW (ref 70–99)
Phosphorus: 4.3 mg/dL (ref 2.5–4.6)
Potassium: 4.9 mmol/L (ref 3.5–5.1)
Sodium: 134 mmol/L — ABNORMAL LOW (ref 135–145)

## 2019-05-03 MED ORDER — CLARITHROMYCIN 250 MG PO TABS
250.0000 mg | ORAL_TABLET | Freq: Two times a day (BID) | ORAL | Status: DC
  Administered 2019-05-03 (×2): 250 mg via ORAL
  Filled 2019-05-03 (×3): qty 1

## 2019-05-03 MED ORDER — AMOXICILLIN 500 MG PO CAPS
500.0000 mg | ORAL_CAPSULE | Freq: Two times a day (BID) | ORAL | Status: DC
  Administered 2019-05-03 (×2): 500 mg via ORAL
  Filled 2019-05-03 (×2): qty 1

## 2019-05-03 NOTE — Progress Notes (Signed)
Subjective: Pt seen at the bedside this morning. She is agitated with the team this morning, threatening litigation and using profanity. Not redirectable.  Objective:  Vital signs in last 24 hours: Vitals:   05/02/19 0355 05/02/19 1223 05/02/19 1922 05/03/19 0625  BP: (!) 152/86 (!) 167/81 (!) 160/73 (!) 162/84  Pulse: 82 82 79 88  Resp: 18 19 18 18   Temp: 97.9 F (36.6 C) (!) 97.3 F (36.3 C) 98 F (36.7 C) 97.8 F (36.6 C)  TempSrc: Oral Oral Oral Oral  SpO2: 93% 99% 100% 100%  Weight: 62.3 kg   61.8 kg   Physical Exam Constitutional:      General: She is not in acute distress. Cardiovascular:     Rate and Rhythm: Normal rate and regular rhythm.  Pulmonary:     Effort: Pulmonary effort is normal.     Breath sounds: Normal breath sounds.     Comments: On room air, speaking comfortably and in full sentences. Abdominal:     General: Bowel sounds are normal.  Neurological:     Mental Status: She is alert.  Psychiatric:        Behavior: Behavior is agitated and aggressive.      Assessment/Plan:  Principal Problem:   Symptomatic anemia Active Problems:   Hypothyroidism, postradioiodine therapy   DM type 2, uncontrolled, with severe neuropathy   Nausea without vomiting   Anorexia   CAP (community acquired pneumonia)   Assessment - Ms. Heney is a 58 year old lady with a history of HTN, T2DM, hypothyroidism, CKDstage IV, sickle cell trait, MDD, and fibromyalgia who presented to the hospital as a direct admission from outpatient clinic for presumed blood loss anemia.She presented with increased fatigue, generalized weakness and pain due to her fibromyalgia in addition to having black tarry stools over the last several weeks to months. In the clinic, her CBC was notable for a low hemoglobin of 4.7 and leukocytosis of 18.3.She is now s/p 3U pRBC and H&H has been stable. She was found to have a leukocytosis and elevated procalcitonin with bibasilar infiltrates on  admission, concerning for pneumonia.   Plan - 1. Symptomatic Anemiaof Unclear Etiology:Hemoglobin was 4.7on admission.Baseline around 8.0.S/p pRBC 3U.Hgb now stable at 10.0. The patient has noted black tarry stools over the last several weeks that have correlated with her increased fatigue, generalized weakness, and increased pain.Iron studies consistent with iron deficiency anemia with the exception of an elevated ferritin (691).Reticulocyte index calculated and was severely low (1.8),underscoring a production problem consistent with pt's advanced kidney disease. -daily CBC to monitor Hgb -GI did upper endoscopy on 7/28, finding esophagitis and gastritis with biopsies taken and a non-bleeding duodenal ulcer. Appreciate their recs: - advance diet as tolerated, PPI BID, avoid NSAIDs. - biopsyresults positive for H. Pylori - starting triple therapy today  - clarithromycin 250mg  tablet PO BID  - amoxicillin 500mg  capsule PO BID  - pantoprazole 40mg  tablet PO BID (for 4 weeks, starting 7/29 and then daily afterward)   2. Pnuemonia - Pt had white count of 18.3 in the clinicand 22 now. She has been afebrile since day 1 of hospitalization (7/27) when temp was 100.6. CXR (7/30) showed bibasilar infiltrates. Pt continuing to have a productive cough at this time.  -dailyCBC - ceftriaxone & azithromycin for CAP coverage 5 day course completed yesterday - pt still having leukocytosis and sputum production - amoxicillin for H pylori treatment will also cover strep pneumo treatment.   3. Fibromyalgia -The patient has been prescribed  gabapentin600 mg QIDand duloxetine 60 mg dailyin the past, however the patient says she is does not take this currently. It makes her feel spaced out and she forgets what happened the week prior. She is very reluctant to opioid use for pain management. Oninitialexam the slightest brush of her clothes or skin produces large amounts of pain.Today  the patient wasmoving around in bed freely and has much less pain.  - pt continues to deny PT/OT -tramadol 50 mg PO q12h PRN - acetaminophen 650 mg PO q6h PRN   4. AKI on top of CKD stage IV - Cr. 6.09 on admission,now 5.41 down from 5.63 yesterday. Baseline around 3.0. Injury likely due to volume depletion and blood loss. The patient says she is okay with going on dialysis short term if it will be only used temporary, but is not interested in being on dialysis long term for ESRD.Bicarb mildly improved today to 13 from 11, and has been persistently low. - monitor with daily BMP Seen by nephrology, appreciate recs: - avoid PICC line placement due to potential dialysis access options in the future. No indication for acute dialysis at this time. - continue sodium bicarbonate 1300mg  PO TID - outpatient follow-up with nephro scheduled for 05/13/19 with labs 1 week prior   5. HTN: BP was elevated at 173/74 in clinic prior to admission. She is prescribed amlodipine 5 mg daily as well as lasix 40 mg daily. Blood pressure stable in 140s/60s without medications.  - restarted home amlodipine 5mg  yesterday - holding home lasix   6. T2DM: T2DM with severe peripheral neuropathy listed in chart, however last A1c 4.8 in March 2020. Had some hypoglycemia to 47 yesterday AM (7/31).  - on 2 units novolog with meals yesterday - SSI sensitive + HS correction    7. Hypothyroidism: Last TSH was highly elevated 196.14 at previous outpatient visitand 64.8 on admission.She was prescribed levothyroxine 150 mg daily on 02/26/2019. Unclear adherence given significantly elevated TSH. - continue home levothyroxine 150 mg, will need to follow closely after discharge   #FEN/GI Pt has not had a bowel movement since admission and is refusing any bowel regimen at this time (Senokot and Miralax ordered)  #Hyponatremia: 132on admission, due to advanced renal disease, now 134  #Hyperkalemia: 5.4 on admission,  now resolved 4.9 today. #Hypoalbuminemia: 2.2 on admission, and 1.7 toady - zofran 4 mg injections q6hr PRN   Diet - full liquid diet Fluids - none DVT ppx - SCDs in place CODE STATUS - PARTIAL CODE Pt stated she is okay with CPR, but does not want to be put an ventilator under any circumstance  Dispo:Discharge pending medical stabilization. Expected LOS 2 days.  Ladona Horns, MD Internal Medicine, PGY1 Pager: [phone]  05/03/2019,6:42 AM

## 2019-05-03 NOTE — Progress Notes (Signed)
Patient vomited after given her morning medicine, MD notified, will give her something for nausea/vomitting par order. Will continue monitor the patient.

## 2019-05-03 NOTE — Telephone Encounter (Signed)
I left a detailed message for the patient to call the office and schedule a follow up with Dr. Carlean Purl.

## 2019-05-03 NOTE — Progress Notes (Addendum)
Occupational Therapy Treatment Patient Details Name: Kayla [name] MRN: [id number] DOB: 01-23-1961 Today's Date: 05/03/2019    History of present illness Patient is a 58 year old female admitted with weakness. Diagnosed with symtomatic anemia hgb 4.9 when admitted.  CXR (7/30) showed bibasilar infiltrates. PMH to include fibromyalgia, CKD, sickle cell, DM, hypothyroidism, HTN.   OT comments  Patient in bed and agreeable to therapist entering room, pt requesting therapist to assist being repositioned in bed because she "can't breathe".  Pt rolling in bed without assist, mod assist to scoot towards HOB using bed pad.  Noticeable wheezing and increased WOB with HOB flat, and pt adamantly donning supplemental oxygen at 2L.  VSS stable, with or without oxygen (98%, HR 88-90). Positioned bed into chair position with pt tolerating well, discussed role of OT vs PT, importance of getting OOB, use of PLB and incentive spirometer.  Pt eager to get OOB and walk tomorrow, but declines further mobility today.  Educated in UE exercises to complete 3x/day for proximal shoulder strengthening. Noted, pt pleasant and cooperative throughout session; even self correcting her language when getting frustrated, and thanked therapist for working with her today.  Will follow, if pt mobility progression does not improve next session will need SNF rehab.    Follow Up Recommendations  Home health OT;Supervision/Assistance - 24 hour    Equipment Recommendations  3 in 1 bedside commode    Recommendations for Other Services      Precautions / Restrictions Precautions Precautions: Fall Restrictions Weight Bearing Restrictions: No       Mobility Bed Mobility Overal bed mobility: Needs Assistance Bed Mobility: Rolling Rolling: Modified independent (Device/Increase time)         General bed mobility comments: pt rolling in bed for therapist to reposition pad without assist, mod assist to assist scooting towards HOB  then repositioned pt into chair position in bed (pt declined further mobility at this time)   Transfers                 General transfer comment: pt declined    Balance                                           ADL either performed or assessed with clinical judgement   ADL Overall ADL's : Needs assistance/impaired                                       General ADL Comments: pt declined engagement in ADLs today, assisted with repositioning in bed      Vision       Perception     Praxis      Cognition Arousal/Alertness: Awake/alert Behavior During Therapy: WFL for tasks assessed/performed Overall Cognitive Status: Impaired/Different from baseline Area of Impairment: Problem solving;Safety/judgement;Awareness                         Safety/Judgement: Decreased awareness of deficits Awareness: Emergent Problem Solving: Slow processing;Requires verbal cues General Comments: pt self limiting        Exercises Exercises: Other exercises Other Exercises Other Exercises: seated in chair position in bed, engaged in 10 reps 1 set of: overhead press, shoudler flexion, and sustained shoulder flexion with supination/pronation    Shoulder Instructions  General Comments reviewed PLB techniques, role of therapy and exercises; VSS during session pt reporting "I can't breathe" upon entry, vitals stable throughout session HR 88-90 O2 98% on RA (and pt applied 2L oxgyen during session)     Pertinent Vitals/ Pain       Pain Assessment: Faces Faces Pain Scale: Hurts a little bit Pain Location: abdomen, chest Pain Descriptors / Indicators: Discomfort Pain Intervention(s): Limited activity within patient's tolerance;Repositioned;Monitored during session  Home Living                                          Prior Functioning/Environment              Frequency  Min 2X/week        Progress Toward  Goals  OT Goals(current goals can now be found in the care plan section)  Progress towards OT goals: Not progressing toward goals - comment(limited participation )  Acute Rehab OT Goals Patient Stated Goal: to go home, to feel less tired OT Goal Formulation: With patient  Plan Discharge plan remains appropriate;Frequency remains appropriate    Co-evaluation                 AM-PAC OT "6 Clicks" Daily Activity     Outcome Measure   Help from another person eating meals?: A Little Help from another person taking care of personal grooming?: A Little Help from another person toileting, which includes using toliet, bedpan, or urinal?: A Lot Help from another person bathing (including washing, rinsing, drying)?: A Little Help from another person to put on and taking off regular upper body clothing?: A Little Help from another person to put on and taking off regular lower body clothing?: A Little 6 Click Score: 17    End of Session    OT Visit Diagnosis: Other abnormalities of gait and mobility (R26.89);Muscle weakness (generalized) (M62.81)   Activity Tolerance Patient limited by lethargy   Patient Left in bed;with call bell/phone within reach;with bed alarm set   Nurse Communication Mobility status;Other (comment)(O2 and pain )        Time: 6967-8938 OT Time Calculation (min): 22 min  Charges: OT General Charges $OT Visit: 1 Visit OT Treatments $Therapeutic Activity: 8-22 mins  Delight Stare, OT Acute Rehabilitation Services Pager [phone] Office [phone]    Delight Stare 05/03/2019, 4:47 PM

## 2019-05-04 ENCOUNTER — Encounter: Payer: Self-pay | Admitting: Licensed Clinical Social Worker

## 2019-05-04 ENCOUNTER — Telehealth: Payer: Self-pay | Admitting: Licensed Clinical Social Worker

## 2019-05-04 DIAGNOSIS — Z8701 Personal history of pneumonia (recurrent): Secondary | ICD-10-CM

## 2019-05-04 DIAGNOSIS — E890 Postprocedural hypothyroidism: Secondary | ICD-10-CM

## 2019-05-04 DIAGNOSIS — N189 Chronic kidney disease, unspecified: Secondary | ICD-10-CM

## 2019-05-04 DIAGNOSIS — E1140 Type 2 diabetes mellitus with diabetic neuropathy, unspecified: Secondary | ICD-10-CM

## 2019-05-04 LAB — GLUCOSE, CAPILLARY
Glucose-Capillary: 99 mg/dL (ref 70–99)
Glucose-Capillary: 119 mg/dL — ABNORMAL HIGH (ref 70–99)
Glucose-Capillary: 120 mg/dL — ABNORMAL HIGH (ref 70–99)
Glucose-Capillary: 161 mg/dL — ABNORMAL HIGH (ref 70–99)

## 2019-05-04 MED ORDER — METOCLOPRAMIDE HCL 5 MG/ML IJ SOLN
5.0000 mg | Freq: Once | INTRAMUSCULAR | Status: AC
  Administered 2019-05-04: 05:00:00 5 mg via INTRAVENOUS
  Filled 2019-05-04: qty 2

## 2019-05-04 MED ORDER — PANTOPRAZOLE SODIUM 40 MG PO PACK
40.0000 mg | PACK | Freq: Two times a day (BID) | ORAL | Status: DC
  Administered 2019-05-04 – 2019-05-06 (×4): 40 mg
  Filled 2019-05-04 (×7): qty 20

## 2019-05-04 MED ORDER — SENNOSIDES 8.8 MG/5ML PO SYRP
5.0000 mL | ORAL_SOLUTION | Freq: Two times a day (BID) | ORAL | Status: DC
  Administered 2019-05-04: 5 mL via ORAL
  Filled 2019-05-04 (×6): qty 5

## 2019-05-04 MED ORDER — AMOXICILLIN 250 MG/5ML PO SUSR
500.0000 mg | Freq: Two times a day (BID) | ORAL | Status: DC
  Administered 2019-05-04 – 2019-05-06 (×5): 500 mg via ORAL
  Filled 2019-05-04 (×6): qty 10

## 2019-05-04 MED ORDER — DOCUSATE SODIUM 50 MG/5ML PO LIQD
50.0000 mg | Freq: Two times a day (BID) | ORAL | Status: DC
  Filled 2019-05-04 (×6): qty 10

## 2019-05-04 MED ORDER — LEVOTHYROXINE SODIUM 25 MCG/ML PO SOLN
150.0000 ug | Freq: Every day | ORAL | Status: DC
  Administered 2019-05-05: 150 ug via ORAL
  Filled 2019-05-04 (×3): qty 6

## 2019-05-04 MED ORDER — CLARITHROMYCIN 250 MG/5ML PO SUSR
250.0000 mg | Freq: Two times a day (BID) | ORAL | Status: DC
  Administered 2019-05-04 – 2019-05-06 (×5): 250 mg via ORAL
  Filled 2019-05-04 (×3): qty 5
  Filled 2019-05-04: qty 10
  Filled 2019-05-04 (×3): qty 5

## 2019-05-04 NOTE — Progress Notes (Signed)
Pt refused sodium bicarbonate and senokot. Educated patient on the purpose of the medication and pt still refused.

## 2019-05-04 NOTE — Discharge Summary (Addendum)
Name: Kayla [name] MRN: [id number] DOB: January 21, 1961 58 y.o. PCP: Asencion Noble, MD  Date of Admission: 04/26/2019  2:24 PM Date of Discharge:  Attending Physician: Gilles Chiquito, MD  Discharge Diagnosis: 1. H pylori infection 2. Symptomatic anemia - multifactorial  3. Community acquired pneumonia 4. AKI on CKD stage 4   Discharge Medications: Allergies as of 05/06/2019      Reactions   Ace Inhibitors    Dizziness, nausea   Percocet [oxycodone-acetaminophen] Itching   Statins    Latex Rash   Sulfamethoxazole Rash   Sulfites Rash      Medication List    STOP taking these medications   fluconazole 150 MG tablet Commonly known as: DIFLUCAN   gabapentin 300 MG capsule Commonly known as: Neurontin   naproxen sodium 220 MG tablet Commonly known as: ALEVE   nystatin 100000 UNIT/ML suspension Commonly known as: MYCOSTATIN   triamcinolone 0.1 % cream : eucerin Crea     TAKE these medications   Accu-Chek Aviva Plus w/Device Kit 1 kit by Does not apply route as directed.   accu-chek softclix lancets Use as instructed   amLODipine 5 MG tablet Commonly known as: NORVASC Take 1 tablet (5 mg total) by mouth daily.   amoxicillin 250 MG/5ML suspension Commonly known as: AMOXIL Take 10 mLs (500 mg total) by mouth every 12 (twelve) hours for 11 days.   Basaglar KwikPen 100 UNIT/ML Sopn Inject 0.2 mLs (20 Units total) into the skin daily.   clarithromycin 250 MG/5ML suspension Commonly known as: BIAXIN Take 5 mLs (250 mg total) by mouth every 12 (twelve) hours for 11 days.   DULoxetine 60 MG capsule Commonly known as: Cymbalta Take 1 capsule (60 mg total) by mouth daily.   furosemide 40 MG tablet Commonly known as: Lasix Take 0.5 tablets (20 mg total) by mouth daily. What changed: how much to take   glucose blood test strip Commonly known as: Accu-Chek Aviva Plus Use as instructed   ketoconazole 2 % shampoo Commonly known as: NIZORAL Apply 1 application  topically 2 (two) times a week.   lansoprazole 30 MG capsule Commonly known as: Prevacid Take 1 capsule (30 mg total) by mouth 2 (two) times daily before a meal for 20 days, THEN 1 capsule (30 mg total) daily for 10 days. Start taking on: May 06, 2019   levothyroxine 150 MCG tablet Commonly known as: SYNTHROID Take 1 tablet (150 mcg total) by mouth daily before breakfast.   lidocaine 5 % Commonly known as: LIDODERM Place 1 patch onto the skin daily. Remove & Discard patch within 12 hours or as directed by MD   polyethylene glycol 17 g packet Commonly known as: MIRALAX / GLYCOLAX Take 17 g by mouth daily.   sennosides 8.8 MG/5ML syrup Commonly known as: SENOKOT Take 5 mLs by mouth 2 (two) times daily.   sodium bicarbonate 650 MG tablet Take 2 tablets (1,300 mg total) by mouth 3 (three) times daily.   topiramate 25 MG capsule Commonly known as: TOPAMAX Take 25 mg by mouth 2 (two) times daily. Take 1 in the morning, 2 at night   Vitamin D (Ergocalciferol) 1.25 MG (50000 UT) Caps capsule Commonly known as: DRISDOL Take 1 capsule (50,000 Units total) by mouth every 7 (seven) days.       Disposition and follow-up:   Kayla [name] was discharged from [facility] in Good condition.  At the hospital follow up visit please address:  1. Problem list  H pylori infection Hgb on admission 4.7 thought to be an acute change in her chronic anemia due to GI bleed based on history of dark stools. EGD performed on day 2 of hospitalization with pathology report on 7/31 confirming H pylori infection. 14 day course of triple therapy (clarithromycin, amoxicillin, and PPI) to be completed on 8/10. GI recommending avoiding NSAIDs, continuing PPI BID till 8/26 and then daily, and getting H pylori stool antigen 8 weeks after therapy off PPI to confirm eradication. Follow-up to be scheduled for Dr. Carlean Purl.  Symptomatic anemia - multifactorial Anemia deemed to be multifactorial  with a clinical picture consistent with slow GI bleed, in addition to lab work suggestive of iron-deficiency anemia, anemia of chronic disease, and hyperproliferation due to CKD. Nephrology to set her up with ESA injections and iron store monitoring after outpatient follow-up.  Community acquired pneumonia Pt found to have CAP with a leukocytosis, fever on day 1 of admission, a non-productive cough, and CXR with bibasilar infiltrates. Treated with ceftriaxone and azithromycin for a 5 day course while hospitalized.   AKI on CKD stage 4 AKI thought to be related to volume depletion and blood loss. Pt's Cr trending down before discharge but not yet back to baseline. Seen by nephrology, who recommended to continue sodium bicarbonate 1358m TID outpatient. Follow-up appointment scheduled for 05/13/2019 at 4:00pm with Dr. PPosey Pronto   Hypothyroidism Pt's TSH elevated on admission though previous TSH also high. Unclear about pt's adherence to levothyroxine prior to hospitalization. Discharged on prior home dose of 1556m daily, will need close monitoring and potential medication adjustment in the outpatient setting.   2.  Labs / imaging needed at time of follow-up: TSH (to determine adherence vs poor response), and CBC (ensure resolution of leukocytosis and Hgb stable)  3.  Pending labs/ test needing follow-up: NONE  Follow-up Appointments: Follow-up Information    PaElmarie ShileyMD Follow up on 05/13/2019.   Specialty: Nephrology [facility]: [facility]: [id number]   Why: Schedule an appointment for a hospital follow-up with your primary care physician in 1-2 weeks. Contact information: [id number]     GeGatha MayerMD. Call in 4 day(s).   Specialty: Gastroenterology Why: Call GI doctor to get follow-up appointment scheduled regarding  your endoscopy. Contact information: [phone]           Hospital Course by problem list: H pylori infection Hgb on admission 4.7 thought to be an acute change in her chronic anemia due to GI bleed based on history of dark stools. EGD performed on day 2 of hospitalization showed acute and erosive esophagitis, gastritis, and a non-bleeding duodenal ulcer with no stigmata of bleeding. Recommended to avoid NSAIDs and start PPI BID for at least 4 weeks, then daily afterwards. Pathology report on 7/31 confirmed H pylori infection. Antibiotics for CAP and PPI after EGD were, unknowingly, treating for H pylori prior to final pathology results. Pt was converted to triple therapy (clarithromycin, amoxicillin, and PPI) after CAP coverage was completed on 8/3. 14 day course to triple-therapy to be completed on 8/17. GI recommending avoiding NSAIDs, continuing PPI BID till 8/26 and then daily, and getting H pylori stool antigen 8 weeks after therapy off PPI to confirm eradication. Follow-up to be scheduled for Dr. GeCarlean Purl  Symptomatic anemia - multifactoral Pt's initial Hgb was 4.7, so she received 3 units of pRBCs. Clinical picture consistent with slow GI bleed, in addition to lab work suggestive of iron-deficiency anemia, anemia of chronic disease, and hyperproliferation due to chronic kidney disease. Responded well to transfusion with Hgb stable ~10. Also received one dose IV iron. Additional infusions held in the setting of CAP infection. Nephrology to set her up with ESA injections and iron store monitoring in the outpatient setting.  Community acquired pneumonia Pt found to have CAP with a leukocytosis, fever on day 1 of admission, a non-productive cough, and CXR with bibasilar infiltrates. Treated with ceftriaxone and azithromycin for a 5 day course while hospitalized. Pt's cough improving prior to discharge, though she had a persistent leukocytosis.  Pathologist smear showed "normocytic anemia and leukocytosis." Towards end of hospitalization pt began refusing lab draws, so unable to trend if white count resolved.  AKI on CKD stage 4 Pt's Cr was elevated to 6.09 on admission and downtrended to 5.04 before discharge. AKI thought to be related to volume depletion and blood loss. Seen by nephrology, who recommended to continue sodium bicarbonate 1352m TID outpatient. Follow-up appointment scheduled for 05/13/2019 at 4:00pm with Dr. PPosey Pronto   Hypothyroidism Pt's TSH on admission was elevated to 64.816. Previous TSH was also elevated to 196.140, so unclear about pt's adherence to levothyroxine prior to hospitalization. Continued with home dose of 1568m daily while in the hospital. Deferred repeating TSH in the setting of acute illness. Consider monitoring again in 3-4 weeks with the potential for further medication adjustment in the outpatient setting.    Discharge Vitals:   BP (!) 158/75 (BP Location: Left Arm)   Pulse 99   Temp 98.1 F (36.7 C) (Oral)   Resp 16   Wt 63.1 kg   LMP 01/12/2014   SpO2 97%   BMI 25.44 kg/m   Pertinent Labs, Studies, and Procedures:   Endoscopy report on 04/28/2019 Impression - LA Grade C acute and erosive esophagitis. Biopsied. - Z-line regular, 40 cm from the incisors. - Gastritis. Biopsied. - Non-bleeding duodenal ulcer with no stigmata of bleeding. - Normal second portion of the duodenum.  Pathology report from endoscopy on 04/28/2019 Diagnosis 1. Stomach, biopsy - CHRONIC ACTIVE GASTRITIS - H. PYLORI ORGANISMS PRESENT - NO INTESTINAL METAPLASIA IDENTIFIED - A Warthin-Starry stain is performed to determine the possibility of the presence of Helicobacter pylori. Organisms of Helicobacter pylori are PRESENT on the Warthin-Starry stain. 2. Esophagus, biopsy, Distal - BENIGN SQUAMOUS MUCOSA WITH ACUTE AND CHRONIC INFLAMMATION - PAS stain is pending and will be reported in an addendum.  CBC Latest  Ref Rng & Units 05/03/2019 05/02/2019 05/01/2019  WBC 4.0 - 10.5 K/uL 22.0(H) 22.6(H) 23.7(H)  Hemoglobin 12.0 - 15.0 g/dL 10.0(L) 9.9(L) 11.3(L)  Hematocrit 36.0 - 46.0 % 28.8(L) 29.2(L) 34.1(L)  Platelets 150 - 400 K/uL 99(L) 113(L) 139(L)   BMP Latest Ref Rng & Units 05/03/2019 05/02/2019 05/01/2019  Glucose 70 - 99 mg/dL 68(L) 100(H) 79  BUN 6 - 20 mg/dL 64(H) 70(H) 66(H)  Creatinine 0.44 - 1.00 mg/dL 5.04(H) 5.45(H) 5.41(H)  BUN/Creat Ratio 9 - 23 - - -  Sodium 135 - 145 mmol/L 134(L) 135 135  Potassium 3.5 - 5.1 mmol/L 4.9 4.7 5.0  Chloride 98 - 111 mmol/L 108 110 113(H)  CO2 22 - 32 mmol/L 14(L) 13(L) 13(L)  Calcium 8.9 - 10.3 mg/dL 9.2 9.2 9.1   Thyroid function tests TSH - 64.816. T3 28, Free T4  0.91  Iron panel Iron 11, TIBC 165, Ferritin 691  Chest X-ray 04/28/2019 FINDINGS: Cardiomegaly. Pulmonary infiltrates in the mid and lower lungs bilaterally. Small amount of pleural fluid on the left. This could be secondary to acute chest syndrome. Ordinary bronchopneumonia is not excluded.  IMPRESSION: Bilateral mid and lower lung infiltrates left worse than right. Possible left effusion. Differential diagnosis is acute chest syndrome versus infectious bronchopneumonia.  Discharge Instructions: Discharge Instructions    Diet - low sodium heart healthy   Complete by: As directed    Increase activity slowly   Complete by: As directed     Discharge instructions to the Patient: You were seen in the hospital after having low blood counts related to an ulcer seen in your stomach with an endoscopy. Continuing taking Protonix, amoxicillin, and clarithromycin to treat an H. Pylori infection of the stomach. Follow-up with Gastroenterology regarding this ulcer and the treatment plan. Additionally, remember to follow-up with your nephrologist and primary care provider as instructed in your discharge summary.  Please return to the emergency room if you begin experiencing shortness of breath,  persistent nausea/vomiting, severe abdominal pain, black or red stools, or any other concerning symptoms.    Signed: Ladona Horns, MD 05/07/2019, 1:43 PM   Pager: [phone]

## 2019-05-04 NOTE — Telephone Encounter (Signed)
Patient was contacted due to a referral from her doctor. (1st attempt). A vm was left for the patient to contact our office to schedule a future appointment.

## 2019-05-04 NOTE — Progress Notes (Signed)
Pt is refusing labs. On call provider notified.

## 2019-05-04 NOTE — Progress Notes (Signed)
Visit to room for routing line care. On entry, pt stated, "No, no, no". Unable to assess midline site due to pt refusal. States will allow "later this afternoon. Attempted to inform of risk; pt currently refusing care.

## 2019-05-04 NOTE — Progress Notes (Signed)
Physical Therapy Treatment Patient Details Name: Kayla [name] MRN: [id number] DOB: 28-May-1961 Today's Date: 05/04/2019    History of Present Illness Patient is a 58 year old female admitted with weakness. Diagnosed with symtomatic anemia hgb 4.9 when admitted. PMH to include fibromyalgia, CKD, sickle cell, DM, hypothyroidism, HTN.    PT Comments    Pt with reports of dizziness on entry, but agreeable to therapy. Pt is min A for bed mobility and supervision for transfer to standing utilizing BSC for stabilization. Pt refuses to ambulate due to dizziness and fear of falling, however agrees to marching in place. Attempted to progress to ambulation but becomes dizzy sits down and requests to sit down. Pt able to sit EoB for 10 min with supervision while speaking to financial counselor on phone. Afterward request to return to bed. Pt is self limiting and will need supervision for safety with ambulation in her home environment as well as HHPT. Pt reports [facility] will assist. Possible d/c tomorrow.    Follow Up Recommendations  Home health PT;Supervision for mobility/OOB     Equipment Recommendations  Rolling walker with 5" wheels;3in1 (PT)    Recommendations for Other Services       Precautions / Restrictions Precautions Precautions: Fall    Mobility  Bed Mobility Overal bed mobility: Needs Assistance Bed Mobility: Supine to Sit;Sit to Supine     Supine to sit: Min assist Sit to supine: Min guard;Min assist   General bed mobility comments: min A for pulling up against therapist to come to EoB, minA for assist with LE back into bed  Transfers Overall transfer level: Needs assistance   Transfers: Sit to/from Stand Sit to Stand: Supervision         General transfer comment: pt refused to use RW, pulls BSC close to bed and holds herself up, c/o dizziness throughout   Ambulation/Gait             General Gait Details: refuses ambulation          Balance Overall balance  assessment: Needs assistance Sitting-balance support: No upper extremity supported;Feet supported Sitting balance-Leahy Scale: Good     Standing balance support: Bilateral upper extremity supported Standing balance-Leahy Scale: Poor Standing balance comment: pt reaching for environmental support with all standing                            Cognition Arousal/Alertness: Awake/alert Behavior During Therapy: WFL for tasks assessed/performed Overall Cognitive Status: Impaired/Different from baseline Area of Impairment: Problem solving;Safety/judgement                         Safety/Judgement: Decreased awareness of deficits;Decreased awareness of safety   Problem Solving: Slow processing General Comments: pt self limiting      Exercises General Exercises - Lower Extremity Hip Flexion/Marching: AROM;Both;10 reps;Standing    General Comments General comments (skin integrity, edema, etc.): VSS      Pertinent Vitals/Pain Pain Assessment: Faces Faces Pain Scale: Hurts a little bit Pain Location: abdomen, chest Pain Descriptors / Indicators: Aching;Guarding Pain Intervention(s): Limited activity within patient's tolerance;Monitored during session;Repositioned           PT Goals (current goals can now be found in the care plan section) Acute Rehab PT Goals PT Goal Formulation: With patient Time For Goal Achievement: 05/04/19 Potential to Achieve Goals: Fair Progress towards PT goals: Progressing toward goals    Frequency    Min  3X/week      PT Plan Current plan remains appropriate       AM-PAC PT "6 Clicks" Mobility   Outcome Measure  Help needed turning from your back to your side while in a flat bed without using bedrails?: A Little Help needed moving from lying on your back to sitting on the side of a flat bed without using bedrails?: A Little Help needed moving to and from a bed to a chair (including a wheelchair)?: A Little Help needed  standing up from a chair using your arms (e.g., wheelchair or bedside chair)?: A Little Help needed to walk in hospital room?: A Lot Help needed climbing 3-5 steps with a railing? : A Lot 6 Click Score: 16    End of Session Equipment Utilized During Treatment: Gait belt Activity Tolerance: Patient limited by fatigue(dizziness) Patient left: in bed;with call bell/phone within reach Nurse Communication: Mobility status PT Visit Diagnosis: Muscle weakness (generalized) (M62.81);Difficulty in walking, not elsewhere classified (R26.2);Pain     Time: 0938-1829 PT Time Calculation (min) (ACUTE ONLY): 23 min  Charges:  $Therapeutic Exercise: 8-22 mins $Therapeutic Activity: 8-22 mins                     Shakeya Kerkman B. Migdalia Dk PT, DPT Acute Rehabilitation Services Pager [phone] Office [phone]    [place] 05/04/2019, 4:51 PM

## 2019-05-04 NOTE — Progress Notes (Signed)
  Date: 05/04/2019  Patient name: Kayla [name]  Medical record number: [id number]  Date of birth: 1961/05/13   Subjective: Initially, when our team tried to enter, all Kayla [name] would say was "no, no, no, out."  I re-entered without my team and we discussed that she had been struggling with nausea overnight and only wanted IV medications today.  She did work with OT and feels she is getting stronger.  I advised that she would be ready to go home tomorrow.  She requested a walker and a 3:1 commode for home which we can order.   Objective:  Vital signs in last 24 hours: Vitals:   05/03/19 1626 05/03/19 1952 05/04/19 0608 05/04/19 1117  BP: (!) 162/77 (!) 158/80 (!) 158/68 (!) 147/88  Pulse: 89 85 90 97  Resp:  18  17  Temp:  98.2 F (36.8 C) 98.2 F (36.8 C) 98.3 F (36.8 C)  TempSrc:  Oral Oral Oral  SpO2:  100% 100% 98%  Weight:   60.8 kg    General: Lying in bed, not distressed Psych: Continues to have aggressive behavior at times Refused remainder of exam.   Assessment/Plan:  Symptomatic anemia due to gastritis, duodenal ulcer Nausea without vomiting, Anorexia, H.pylori infection - Hgb stable yesterday, labs were declined this morning by the patient - She was started on triple therapy yesterday with clarithromycin, amoxicillin and protonix BID - she requested these be changed to liquid today, which was done - Continue therapy - Continue to monitor CBC - Nausea treatment with Zofran and Reglan PRN - Tramadol for pain  Hypothyroidism, postradioiodine therapy - She has been getting home dose of synthroid, did not receive today as refused pill medications    DM type 2, uncontrolled, with severe neuropathy - Has had issues with hypoglycemia while in the hospital, likely related to decreased PO intake - SSI and HS correction - monitor CBG    CAP (community acquired pneumonia) - Treated, but still with elevated WBC and sputum - Continue amoxicillin as above  AKI on  CKD - Renal function was slightly improved yesterday - Avoid nephrotoxins  HTN - BP continues to be elevated, she is only intermittently taking her medications, but did take her amlodipine today - Follow up outpatient for further management.   Dispo: Anticipated discharge tomorrow.    Sid Falcon, MD 05/04/2019, 2:52 PM

## 2019-05-05 ENCOUNTER — Other Ambulatory Visit: Payer: Self-pay

## 2019-05-05 LAB — GLUCOSE, CAPILLARY
Glucose-Capillary: 180 mg/dL — ABNORMAL HIGH (ref 70–99)
Glucose-Capillary: 165 mg/dL — ABNORMAL HIGH (ref 70–99)
Glucose-Capillary: 123 mg/dL — ABNORMAL HIGH (ref 70–99)
Glucose-Capillary: 67 mg/dL — ABNORMAL LOW (ref 70–99)
Glucose-Capillary: 96 mg/dL (ref 70–99)
Glucose-Capillary: 207 mg/dL — ABNORMAL HIGH (ref 70–99)

## 2019-05-05 MED ORDER — HYDROMORPHONE HCL 1 MG/ML IJ SOLN
0.2500 mg | Freq: Once | INTRAMUSCULAR | Status: AC
  Administered 2019-05-05: 0.25 mg via INTRAVENOUS
  Filled 2019-05-05: qty 0.5

## 2019-05-05 MED ORDER — LIDOCAINE 5 % EX PTCH
1.0000 | MEDICATED_PATCH | CUTANEOUS | Status: DC
  Administered 2019-05-05 – 2019-05-06 (×2): 1 via TRANSDERMAL
  Filled 2019-05-05 (×2): qty 1

## 2019-05-05 NOTE — Progress Notes (Signed)
Hypoglycemic Event  CBG: 67  Treatment: 4 oz juice/soda  Symptoms: None  Follow-up CBG: Time: CBG Result:96  Possible Reasons for Event: Inadequate meal intake/Unknown   Comments/MD notified:    Jarrett Albor

## 2019-05-05 NOTE — TOC Initial Note (Addendum)
Transition of Care [facility]) - Initial/Assessment Note    Patient Details  Name: Kayla [name] MRN: [id number] Date of Birth: [birth year]  [facility] Care [facility]) CM/SW Contact:    Marilu Favre, RN Phone Number: 05/05/2019, 1:17 PM  Clinical Narrative:                  Confirmed face sheet information with patient. Discussed PT recommendations for HHPT , walker and 3 in 1. Patient stated she lives alone and does not have help, and a MD just told her she would be going to a SNF at discharge. Confirmed with patient she does not have insurance. Per PT note patient was dizzy and refused to ambulate but marched in place. Discussed with Dr Ronnald Ramp and Baxter Flattery with SW    Patient has now decided she will go home with her daughter Kayla [name] [phone] at discharge. Address is [address] [place] [address]. Called daughter at bedside and confirmed . Patient currently on oxygen have asked MD to wean to see if needed at home.   Referral for HHPT given to [facility] with [facility].  Referral for walker and 3 in1 given to Zack with [facility].  Explained to both patient and daughter [facility] and [employer] will ask questions to see if she qualifies for charity HHPT and DME if she does not she will be given price. Both voiced understanding.  Pharmacy changed to [facility] , will follow for medication needs.        Patient Goals and CMS Choice Patient states their goals for this hospitalization and ongoing recovery are:: to go to rehab to get stronger CMS Medicare.gov Compare Post Acute Care list provided to:: Patient    Expected Discharge Plan and Services                                                Prior Living Arrangements/Services   Lives with:: Self                   Activities of Daily Living      Permission Sought/Granted                  Emotional Assessment              Admission diagnosis:  Diabetes renal failure weakness Patient Active Problem List   Diagnosis Date Noted  . CAP (community acquired pneumonia) 04/29/2019  . Nausea without vomiting   . Anorexia   . Weakness 04/26/2019  . Symptomatic anemia 04/26/2019  . Urinary urgency 02/26/2019  . Vaginal discharge 02/26/2019  . Rash of body 02/26/2019  . Medication management 05/14/2018  . Right upper quadrant abdominal pain 05/04/2018  . Thrush, oral 05/04/2018  . Trigger finger, left middle finger 12/15/2017  . Pain and swelling of left ankle 08/07/2017  . Yeast vaginitis 10/21/2016  . Hypercalcemia 11/28/2015  . Eczema 11/27/2015  . Elevated alkaline phosphatase level 06/27/2014  . CKD (chronic kidney disease) stage 4, GFR 15-29 ml/min (HCC) 06/27/2014  . Unspecified vitamin D deficiency 04/20/2013  . Essential hypertension, benign 02/15/2013  . AGUS favor benign 09/04/2012  . Multiple falls 08/17/2012  . Healthcare maintenance 08/17/2012  . Fibromyalgia syndrome 07/21/2012  . PAP SMEAR, ABNORMAL 06/26/2009  . OBESITY, MILD 09/08/2008  . HLD (hyperlipidemia) 03/30/2007  . 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  DM type 2, uncontrolled, with severe neuropathy 07/31/2006  . SICKLE-CELL TRAIT 07/31/2006  . Major depressive disorder 07/31/2006  . SHOULDER PAIN 07/31/2006  . Insomnia 07/31/2006  . PROTEINURIA 07/31/2006   PCP:  Asencion Noble, MD Pharmacy:   [facility] [place] [address] Phone: [phone] Fax: [place], [place] - [address] [place] [address] Phone: [phone] Fax: [phone]     Social Determinants of Health (SDOH) Interventions    Readmission Risk Interventions No flowsheet data found.

## 2019-05-05 NOTE — TOC Progression Note (Signed)
Transition of Care [facility]) - Progression Note    Patient Details  Name: Kayla [name] MRN: [id number] Date of Birth: 21-Mar-1961  Transition of Care [facility]) CM/SW [place], [place] Phone Number: 05/05/2019, 2:39 PM  Clinical Narrative:    CSW spoke with patient at bedside.  Patient stated" her daughter wants her to come home and she is going home and not to SNF.  CSW updated NCM Heather.  CSW will continue to follow.        Expected Discharge Plan and Services                                                 Social Determinants of Health (SDOH) Interventions    Readmission Risk Interventions No flowsheet data found.

## 2019-05-05 NOTE — Progress Notes (Signed)
Order to Advance diet as tolerate. Talked with patient she is eager to eat regular food. Diet changed to soft diet. MD aware.

## 2019-05-05 NOTE — Progress Notes (Signed)
Patient c/o of chest pain 10/10. Assessed patient, patient pointing to her left axilla. EKG done, VS done, Tramadol given. MD paged. Nitro was refused by patient. MD at bedside.

## 2019-05-05 NOTE — Plan of Care (Signed)
  Problem: Nutrition: Goal: Adequate nutrition will be maintained Outcome: Progressing   Problem: Coping: Goal: Level of anxiety will decrease Outcome: Progressing   Problem: Elimination: Goal: Will not experience complications related to bowel motility Outcome: Progressing   

## 2019-05-05 NOTE — Progress Notes (Signed)
Patients oxygen stable on RA at rest 96%

## 2019-05-05 NOTE — Progress Notes (Signed)
Subjective: Paged by RN for pt complaining of L chest pain. Pt allowing only one doctor to evaluate her. Pt seen at the bedside. States pain began gradually this morning and gets better when she is sleeping. No exacerbating factors identified. No changes with body positioning. Rates the pain 10/10, and describes it has sharp and "feels like an elephant is sitting on my chest." Pain reproducible with palpation of L shoulder, axilla, and chest. Pt repeating "please let me go home so I can die," "send me home, send me to [facility], or another hospital so I can die."  Objective:  Vital signs in last 24 hours: Vitals:   05/04/19 0608 05/04/19 1117 05/04/19 2058 05/05/19 0602  BP: (!) 158/68 (!) 147/88 (!) 170/82 (!) 160/79  Pulse: 90 97 97 88  Resp:  17 20 20   Temp: 98.2 F (36.8 C) 98.3 F (36.8 C) 98.2 F (36.8 C) 98.5 F (36.9 C)  TempSrc: Oral Oral Oral Oral  SpO2: 100% 98% 98% 98%  Weight: 60.8 kg      Physical Exam Vitals signs and nursing note reviewed.  Constitutional:      General: She is not in acute distress.      Comments: On 5L [place], O2 sat 100. Pt intermittently clutching L chest/shoulder.  Cardiovascular:     Rate and Rhythm: Normal rate and regular rhythm.     Heart sounds: Normal heart sounds. No murmur. No friction rub. No gallop.   Pulmonary:     Effort: Pulmonary effort is normal. No tachypnea or respiratory distress.     Breath sounds: Transmitted upper airway sounds present. No decreased air movement. No wheezing, rhonchi or rales.  Abdominal:     General: Abdomen is flat. There is no distension.     Tenderness: There is no abdominal tenderness.  Musculoskeletal:     Right lower leg: No edema.     Left lower leg: No edema.     Comments: SCDs in place.  Neurological:     General: No focal deficit present.     Mental Status: She is alert.  Psychiatric:        Mood and Affect: Affect is labile (at times anxious, angry, or depressed).      Assessment/Plan:  Principal Problem:   Symptomatic anemia Active Problems:   Hypothyroidism, postradioiodine therapy   DM type 2, uncontrolled, with severe neuropathy   Nausea without vomiting   Anorexia   CAP (community acquired pneumonia)  Assessment - Kayla [name] is a 58 year old lady with a history of HTN, T2DM, hypothyroidism, CKDstage IV, sickle cell trait, MDD, and fibromyalgia who presented to the hospital as a direct admission from outpatient clinic for presumed blood loss anemia.She presented with increased fatigue, generalized weakness and pain due to her fibromyalgia in addition to having black tarry stools over the last several weeks to months. In the clinic, her CBC was notable for a low hemoglobin of 4.7 and leukocytosis of 18.3.She is now s/p 3U pRBC and H&H has been stable. She had a leukocytosis and elevated procalcitonin with bibasilar infiltrates on admission, concerning for pneumonia which was treated with azithro/ceftriaxone.   Plan - Fibromyalgia Chest pain today more consistent with fibromyalgia pain or musculoskeletal in nature, less likely ACS or PE. Pt is nontachycardic, not tachypnic, and clinically stable. EKG reviewed - normal rate, sinus rhythm, no changes from prior. O2 sat 100 on 5L [place]. She refused nitroglycerin. Pain is reproducible on examination. SCDs were in place. Pt endorses they  have always been on while she's in bed unless they are unplugged when she it out of bed to use the bedside commode. - pt received tramadol 50mg  PO  - decreased O2 from 5L to 2L [place] - ordered one time dose of 0.25mg  Dilaudid and Lidocaine patch - has acetaminophen 650 mg PO q6h PRN - pt worked with PT/OT - both recommending home health PT/OT   Symptomatic Anemialikely due to gastric ulcer:Hemoglobin was 4.7on admission, s/p pRBC 3U.Hgb now stable around 10.0. Iron studies consistent with iron deficiency anemia with low reticulocyte index (1.8),underscoring an additional  production problem consistent with pt's advanced kidney disease. -pt refusing daily CBC lab draws -GI did upper endoscopy on 7/28, finding esophagitis and gastritis with biopsies taken and a non-bleeding duodenal ulcer. Appreciate their recs: - advance diet as tolerated, PPI BID, avoid NSAIDs.  - should follow-up with Dr. Carlean Purl who saw her previously - biopsyresults positive for H. Pylori - starting triple therapy today  - clarithromycin 250mg  tablet PO BID (continue till 8/17)  - amoxicillin 500mg  capsule PO BID (continue till 8/17)  - pantoprazole 40mg  tablet PO BID (for 4 weeks until 8/26 and then daily afterward)   Pnuemonia - Pt had white count of 18.3 in the clinicand still elevated as of 05/03/19. She has been afebrile since day 1 of hospitalization (7/27) when temp was 100.6. CXR (7/30) showed bibasilar infiltrates.  -pt refusing dailyCBC lab draws - completed 5 days of ceftriaxone & azithromycin for CAP coverage   AKI on top of CKD stage IV - Cr. 6.09 on admission,had fallen to 5.04. Baseline around 3.0. Injury likely due to volume depletion and blood loss. The patient says she is okay with going on dialysis short term if it will be only used temporary, but is not interested in being on dialysis long term for ESRD.Bicarb consistently low 13-14. - monitor with daily BMP; pt currently refusing lab draws Seen previously by nephrology - avoid PICC line placement due to potential dialysis access options in the future. No indication for acute dialysis at this time. - continue sodium bicarbonate 1300mg  PO TID - outpatient follow-up with nephro scheduled for 05/13/19 with labs 1 week prior   HTN: BP was elevated at 173/74 in clinic prior to admission. She is prescribed amlodipine 5 mg daily as well as lasix 40 mg daily at home. - on home amlodipine 5mg   - holding home lasix  T2DM: T2DM with severe peripheral neuropathy listed in chart, however last A1c 4.8 in March  2020. Had some hypoglycemia to 47 yesterday AM (7/31).  - on 2 units novolog with meals yesterday - SSI sensitive + HS correction   Hypothyroidism: Last TSH was highly elevated 196.14 at previous outpatient visitand 64.8 on admission.She was prescribed levothyroxine 150 mg daily on 02/26/2019. Unclear adherence given significantly elevated TSH. - continue home levothyroxine 150 mg, will need to follow closely after discharge   #FEN/GI Pt refusing any bowel regimen at this time (Miralax ordered) #Hyponatremia: consistently low/low normal due to advanced renal disease #Hyperkalemia - resolved #Hypoalbuminemia: consistently 1.7, likely the result of poor PO intake - zofran 4 mg injections q6hr PRN   Diet - full liquid diet Fluids - none DVT ppx - SCDs in place CODE STATUS - PARTIAL CODE Pt stated she is okay with CPR, but does not want to be put an ventilator under any circumstance  Dispo:Discharge pending today.  Ladona Horns, MD Internal Medicine, PGY1 Pager: [phone]  05/05/2019,6:48 AM

## 2019-05-05 NOTE — Progress Notes (Signed)
Occupational Therapy Treatment Patient Details Name: Kayla [name] MRN: [id number] DOB: 07-27-1961 Today's Date: 05/05/2019    History of present illness Patient is a 58 year old female admitted with weakness. Diagnosed with symtomatic anemia hgb 4.9 when admitted. PMH to include fibromyalgia, CKD, sickle cell, DM, hypothyroidism, HTN.   OT comments  Patient supine in bed and agreeable to OT.  Continues to report chest pain, but improved from earlier today. Patient agreeable to complete self care/transfer to [facility]. Min assist for bed mobility as pt reaching for therapists support for trunk elevation.  Once sitting EOB, pt reports dizziness; given increased time to sit EOB with patient nodding in/out appearing to fall asleep and not following therapist commands, then finally assisted to supine for safety. Pt alert once supine and pt agreeable to attempt orthostatic vitals- see below for details but not orthostatic.  At EOB, with NT present, patient becomes lethargic and nodding off again, not answering to her name or following commands consistently.  Once alert at EOB, patient requests to transfer to 3:1 commode, not realizing the safety risk; utilized bed pan with mod assist +2 for safety.  At this time patient reports plan to DC home alone.  PT IS NOT SAFE TO DC HOME ALONE and is a high fall risk, updated dc recommendations to SNF. Will follow acutely.    Follow Up Recommendations  SNF;Supervision/Assistance - 24 hour    Equipment Recommendations  3 in 1 bedside commode    Recommendations for Other Services      Precautions / Restrictions Precautions Precautions: Fall Restrictions Weight Bearing Restrictions: No       Mobility Bed Mobility Overal bed mobility: Needs Assistance Bed Mobility: Supine to Sit;Sit to Supine     Supine to sit: Min assist Sit to supine: Min assist   General bed mobility comments: min assist to support trunk (pt reaching for HHA), assist to guide back to  supine   Transfers                 General transfer comment: deferred due to safety    Balance Overall balance assessment: Needs assistance Sitting-balance support: No upper extremity supported;Feet supported Sitting balance-Leahy Scale: Fair Sitting balance - Comments: min guard for safety, min assist when nodding asleep                                   ADL either performed or assessed with clinical judgement   ADL Overall ADL's : Needs assistance/impaired                           Toilet Transfer Details (indicate cue type and reason): attempted, unable due to safety  Toileting- Clothing Manipulation and Hygiene: Moderate assistance;Bed level;+2 for safety/equipment Toileting - Clothing Manipulation Details (indicate cue type and reason): mod assist +2 safety for placement of bed pan, patient able to complete hygiene after setup      Functional mobility during ADLs: Minimal assistance General ADL Comments: pt limited by cognition, dizziness and impaired balance     Vision       Perception     Praxis      Cognition Arousal/Alertness: Awake/alert;Lethargic Behavior During Therapy: WFL for tasks assessed/performed Overall Cognitive Status: Impaired/Different from baseline Area of Impairment: Attention;Memory;Following commands;Safety/judgement;Awareness;Problem solving  Current Attention Level: Focused Memory: Decreased recall of precautions;Decreased short-term memory Following Commands: Follows one step commands inconsistently;Follows one step commands with increased time Safety/Judgement: Decreased awareness of deficits;Decreased awareness of safety Awareness: Intellectual Problem Solving: Slow processing;Decreased initiation;Difficulty sequencing;Requires verbal cues General Comments: pt self limiting, flucutates during session between awake and lethargic; appears to nod off at EOB at times with poor  awarneess of safety and progression of session, as well as continues to report can dc home alone safely        Exercises     Shoulder Instructions       General Comments BP: supine 143/71; EOB 151/73 EOB; SpO2 on RA 95%; Hr 82-90    Pertinent Vitals/ Pain       Pain Assessment: 0-10 Pain Score: 8  Pain Location: chest(per RN musculoskeletal pain ) Pain Descriptors / Indicators: Discomfort Pain Intervention(s): Limited activity within patient's tolerance;Monitored during session;Repositioned  Home Living                                          Prior Functioning/Environment              Frequency  Min 2X/week        Progress Toward Goals  OT Goals(current goals can now be found in the care plan section)  Progress towards OT goals: Not progressing toward goals - comment(cognition, lethargy, dizziness )  Acute Rehab OT Goals Patient Stated Goal: to feel better OT Goal Formulation: With patient  Plan Discharge plan needs to be updated;Frequency remains appropriate    Co-evaluation                 AM-PAC OT "6 Clicks" Daily Activity     Outcome Measure   Help from another person eating meals?: A Little Help from another person taking care of personal grooming?: A Little Help from another person toileting, which includes using toliet, bedpan, or urinal?: A Lot Help from another person bathing (including washing, rinsing, drying)?: A Lot Help from another person to put on and taking off regular upper body clothing?: A Lot Help from another person to put on and taking off regular lower body clothing?: A Lot 6 Click Score: 14    End of Session    OT Visit Diagnosis: Other abnormalities of gait and mobility (R26.89);Muscle weakness (generalized) (M62.81);Other symptoms and signs involving cognitive function   Activity Tolerance Patient limited by lethargy   Patient Left in bed;with call bell/phone within reach;with bed alarm set;with  nursing/sitter in room;with SCD's reapplied   Nurse Communication Mobility status;Other (comment)(dc plan updated to SNF)        Time: 0300-9233 OT Time Calculation (min): 22 min  Charges: OT Treatments $Self Care/Home Management : 8-22 mins  Delight Stare, OT Acute Rehabilitation Services Pager [phone] Office [phone]    Delight Stare 05/05/2019, 3:06 PM

## 2019-05-06 DIAGNOSIS — A048 Other specified bacterial intestinal infections: Secondary | ICD-10-CM

## 2019-05-06 LAB — GLUCOSE, CAPILLARY
Glucose-Capillary: 149 mg/dL — ABNORMAL HIGH (ref 70–99)
Glucose-Capillary: 183 mg/dL — ABNORMAL HIGH (ref 70–99)

## 2019-05-06 MED ORDER — CLARITHROMYCIN 250 MG/5ML PO SUSR: 250.0000 mg | Freq: Two times a day (BID) | ORAL | 0 refills | Status: AC

## 2019-05-06 MED ORDER — FUROSEMIDE 40 MG PO TABS: 20.0000 mg | ORAL_TABLET | Freq: Every day | ORAL | 1 refills | Status: DC

## 2019-05-06 MED ORDER — LANSOPRAZOLE 30 MG PO CPDR: DELAYED_RELEASE_CAPSULE | ORAL | 0 refills | Status: DC

## 2019-05-06 MED ORDER — SODIUM BICARBONATE 650 MG PO TABS: 1300.0000 mg | ORAL_TABLET | Freq: Three times a day (TID) | ORAL | 0 refills | Status: AC

## 2019-05-06 MED ORDER — SENNOSIDES 8.8 MG/5ML PO SYRP: 5.0000 mL | ORAL_SOLUTION | Freq: Two times a day (BID) | ORAL | 0 refills | Status: DC

## 2019-05-06 MED ORDER — PANTOPRAZOLE SODIUM 40 MG PO PACK: PACK | ORAL | 0 refills | Status: DC

## 2019-05-06 MED ORDER — LEVOTHYROXINE SODIUM 150 MCG PO TABS
150.0000 ug | ORAL_TABLET | Freq: Every day | ORAL | Status: DC
  Administered 2019-05-06: 150 ug via ORAL
  Filled 2019-05-06: qty 1
  Filled 2019-05-06: qty 2
  Filled 2019-05-06: qty 1

## 2019-05-06 MED ORDER — LIDOCAINE 5 % EX PTCH: 1.0000 | MEDICATED_PATCH | CUTANEOUS | 0 refills | Status: DC

## 2019-05-06 MED ORDER — AMOXICILLIN 250 MG/5ML PO SUSR: 500.0000 mg | Freq: Two times a day (BID) | ORAL | 0 refills | Status: AC

## 2019-05-06 MED ORDER — POLYETHYLENE GLYCOL 3350 17 G PO PACK: 17.0000 g | PACK | Freq: Every day | ORAL | 0 refills | Status: DC

## 2019-05-06 MED FILL — FUROSEMIDE 40 MG TABLET: 40 | 30 days supply | Qty: 15 | Fill #0

## 2019-05-06 MED FILL — SODIUM BICARB 10 GRAIN TAB: 650 | 30 days supply | Qty: 180 | Fill #0

## 2019-05-06 MED FILL — CLARITHROMYCIN 250 MG/5 ML: 250 | 11 days supply | Qty: 100 | Fill #0

## 2019-05-06 MED FILL — LANSOPRAZOLE DR 30 MG CAP: 30 | 30 days supply | Qty: 50 | Fill #0

## 2019-05-06 MED FILL — SENNA 8.8 MG/5ML SYRP: 8.8 | 10 days supply | Qty: 240 | Fill #0

## 2019-05-06 MED FILL — AMOXICILLIN 250 MG/5 ML SUS: 250 | 11 days supply | Qty: 300 | Fill #0

## 2019-05-06 NOTE — TOC Transition Note (Signed)
Transition of Care [facility]) - CM/SW Discharge Note   Patient Details  Name: Zahava Brezinski MRN: [id number] Date of Birth: 02-Aug-1961  Transition of Care [facility] CM/SW Contact:  Marilu Favre, RN Phone Number: 05/06/2019, 9:51 AM   Clinical Narrative:      [facility] . TOC will bring her medications to her prior to discharge. Entered in [facility] system, co pay $3 per prescription. Patient had wallet with her yesterday.   [employer] was to deliver walker and 3 in1 to her yesterday, should be in room now.   HHPT arranged with [place].   Daughter will pick up patient at discharge.       Patient Goals and CMS Choice Patient states their goals for this hospitalization and ongoing recovery are:: to go to rehab to get stronger CMS Medicare.gov Compare Post Acute Care list provided to:: Patient    Discharge Placement                       Discharge Plan and Services                                     Social Determinants of Health (SDOH) Interventions     Readmission Risk Interventions No flowsheet data found.

## 2019-05-06 NOTE — Discharge Instructions (Signed)
You were seen in the hospital after having low blood counts related to an ulcer seen in your stomach with an endoscopy. Continuing taking Protonix, amoxicillin, and clarithromycin to treat an H. Pylori infection of the stomach. Follow-up with Gastroenterology regarding this ulcer and the treatment plan. Additionally, remember to follow-up with your nephrologist and primary care provider as instructed in your discharge summary.  Please return to the emergency room if you begin experiencing shortness of breath, persistent nausea/vomiting, severe abdominal pain, black or red stools, or any other concerning symptoms.

## 2019-05-06 NOTE — Progress Notes (Signed)
Pt refused CBC labs, per night shift nurse and Raquel Sarna lab tech also refused her 5am labs. Spoke with pt and she stated" they have been trying to get blood from me but if I don't want to" Spoke with pt on importance of lab work and states understanding and refuses. Paged MD to make aware.

## 2019-05-06 NOTE — Progress Notes (Addendum)
Subjective: Pt seen at the bedside this morning. States she feel well this morning and is looking forward to discharge today to her daughter's home.  Objective:  Vital signs in last 24 hours: Vitals:   05/05/19 1158 05/05/19 1708 05/05/19 2139 05/06/19 0637  BP: (!) 151/73  (!) 154/75 (!) 152/73  Pulse: 86  92 92  Resp: 16  20 20   Temp: 98.4 F (36.9 C)  98.6 F (37 C) 98.1 F (36.7 C)  TempSrc: Oral  Oral Oral  SpO2: 100% 96% 96% 93%  Weight:       Physical Exam Vitals signs reviewed.  Constitutional:      General: She is not in acute distress. Cardiovascular:     Rate and Rhythm: Normal rate and regular rhythm.     Heart sounds: Normal heart sounds. No murmur. No friction rub. No gallop.   Pulmonary:     Effort: Pulmonary effort is normal.     Breath sounds: No wheezing, rhonchi or rales.  Abdominal:     General: There is no distension.     Palpations: Abdomen is soft.     Tenderness: There is no abdominal tenderness.  Neurological:     Mental Status: She is alert.      Assessment/Plan:  Principal Problem:   Symptomatic anemia Active Problems:   Hypothyroidism, postradioiodine therapy   DM type 2, uncontrolled, with severe neuropathy   Nausea without vomiting   Anorexia   CAP (community acquired pneumonia)  Assessment - Kayla [name] is a 58 year old lady with a history of HTN, T2DM, hypothyroidism, CKDstage IV, sickle cell trait, MDD, and fibromyalgia who presented to the hospital as a direct admission from outpatient clinic for presumed blood loss anemia.She presented with increased fatigue, generalized weakness and pain due to her fibromyalgia in addition to having black tarry stools over the last several weeks to months. In the clinic, her CBC was notable for a low hemoglobin of 4.7 and leukocytosis of 18.3.She is now s/p 3U pRBC and H&H has been stable. She had a leukocytosis and elevated procalcitonin with bibasilar infiltrates on admission, concerning for  pneumonia which was treated with azithro/ceftriaxone. Will be discharged to daughter's home today.  Plan - Fibromyalgia Chest pain today more consistent with fibromyalgia pain or musculoskeletal in nature, less likely ACS or PE. SCDs were in place. Pt endorses they have always been on while she's in bed unless they are unplugged when she it out of bed to use the bedside commode. - tramadol 50mg  PO BID PRN - Lidocaine patch given for axilla - has acetaminophen 650 mg PO q6h PRN - pt worked with PT/OT - will get home health PT/OT at discharge   Symptomatic Anemialikely due to gastric ulcer:Hemoglobin was 4.7on admission, s/p pRBC 3U.Hgb now stable around 10.0. Iron studies consistent with iron deficiency anemia with low reticulocyte index (1.8),underscoring an additional production problem consistent with pt's advanced kidney disease. -pt continues to refuse daily CBC lab draws -GI did upper endoscopy on 7/28, finding esophagitis and gastritis with biopsies taken and a non-bleeding duodenal ulcer - advance diet as tolerated, PPI BID, avoid NSAIDs. - should follow-up with Dr. Carlean Purl who saw her previously - biopsyresults positive for H. Pylori - starting triple therapy today  - clarithromycin 250mg  tablet PO BID (continue till 8/17)  - amoxicillin 500mg  capsule PO BID (continue till 8/17)  - pantoprazole 40mg  tablet PO BID (for 4 weeks until 8/26 and then daily afterward)   Pnuemonia - Pt had  white count of 18.3 in the clinicand still elevated as of 05/03/19. She has been afebrile since day 1 of hospitalization (7/27) when temp was 100.6. CXR (7/30) showed bibasilar infiltrates.  -pt continues to refuse dailyCBC lab draws to check leukocytosis - completed 5 days of ceftriaxone & azithromycin for CAP coverage - clinically improved - she used nasal cannula for subjective shortness of breath, though pt has no oxygen requirement   AKI on top of CKD stage IV - Cr. 6.09 on  admission,had fallen to 5.04. Baseline around 3.0. Injury likely due to volume depletion and blood loss. The patient says she is okay with going on dialysis short term if it will be only used temporary, but is not interested in being on dialysis long term for ESRD.Bicarb consistently low 13-14. - pt continues to refuse daily BMP to monitor Cr and electrolytes Seen previously by nephrology - avoid PICC line placement due to potential dialysis access options in the future. - continue sodium bicarbonate 1300mg  PO TID - outpatient follow-up with nephro scheduled for 05/13/19   HTN: BP was elevated at 173/74 in clinic prior to admission. She is prescribed amlodipine 5 mg daily as well as lasix 40 mg daily at home. - continue home amlodipine 5mg   - will decrease home lasix to 20mg  at discharge, as she did well without any diuretics in the hospital  T2DM: T2DM with severe peripheral neuropathy listed in chart, however last A1c 4.8 in March 2020. - on 2 units novolog with meals  - SSI sensitive + HS correction   Hypothyroidism: Last TSH was highly elevated 196.14 at previous outpatient visitand 64.8 on admission.She was prescribed levothyroxine 150 mg daily on 02/26/2019. Unclear adherence given significantly elevated TSH. - continue home levothyroxine 150 mg, will need to follow closely after discharge   #FEN/GI Pt refusing any bowel regimen at this time (Miralax/Senokot ordered) #Hyponatremia: consistently low/low normal due to advanced renal disease #Hyperkalemia - resolved #Hypoalbuminemia: consistently 1.7, likely the result of poor PO intake - zofran 4 mg injections q6hr PRN   Diet - advance as tolerated Fluids - none DVT ppx - SCDs CODE STATUS - PARTIAL CODE Pt stated she is okay with CPR, but does not want to be put an ventilator under any circumstance  Dispo:Discharge today to daughter's home.  Kayla Horns, MD Internal Medicine, PGY1 Pager: [phone]   05/06/2019,6:39 AM

## 2019-05-06 NOTE — Progress Notes (Signed)
Discharge and medication education given with teach back to pt and daughter. Questions and concerns were answered. Iv removed, clean dry and intact, pressure and dressing applied. Pt was dressed and belongings at bedside: bag, cell phone, wallet, clothes and shoes, necklace and bracelet.  Daughter at bedside.

## 2019-05-06 NOTE — Progress Notes (Signed)
Patient refused labs this am  

## 2019-05-06 NOTE — Progress Notes (Signed)
Spoke with bedside nurse regarding PICC removal order.  Patient has ML and bedside RN can remove.  Understanding verbalized.  Carolee Rota, RN

## 2019-05-06 NOTE — Progress Notes (Signed)
Physical Therapy Treatment Patient Details Name: Kayla [name] MRN: [id number] DOB: 07-04-1961 Today's Date: 05/06/2019    History of Present Illness Patient is a 58 year old female admitted with weakness. Diagnosed with symtomatic anemia hgb 4.9 when admitted. PMH to include fibromyalgia, CKD, sickle cell, DM, hypothyroidism, HTN.    PT Comments    Pt is excited to be discharging to her daughter's house this afternoon and initially reluctant to work with therapy as she reports "saving my energy." Pt encouraged to ambulate with therapy to confirm she will be able to complete to get home. Pt is minA for bed mobility, transfers and ambulation of 12 feet with RW. Pt reports that daughter's friend will carry her into house so pt refused stair training. Pt educated on need for family to be with her when she is moving around due to her dizziness. Pt verbalizes understanding. Pt will need HHPT to improve strength and balance.     Follow Up Recommendations  Home health PT;Supervision for mobility/OOB     Equipment Recommendations  Rolling walker with 5" wheels;3in1 (PT)       Precautions / Restrictions Precautions Precautions: Fall Restrictions Weight Bearing Restrictions: No    Mobility  Bed Mobility Overal bed mobility: Needs Assistance Bed Mobility: Supine to Sit;Sit to Supine     Supine to sit: Min assist Sit to supine: Min guard;Min assist   General bed mobility comments: min A for pulling up against therapist to come to EoB, minA for assist with LE back into bed  Transfers Overall transfer level: Needs assistance   Transfers: Sit to/from Stand Sit to Stand: Min guard         General transfer comment: min guard for safety, vc for hand placement for power up to RW, able to self steady and reach to RW, minor c/o of dizziness  Ambulation/Gait Ambulation/Gait assistance: Min assist Gait Distance (Feet): 12 Feet Assistive device: Rolling walker (2 wheeled) Gait  Pattern/deviations: Step-through pattern;Decreased step length - right;Decreased step length - left;Shuffle;Trunk flexed Gait velocity: slowed Gait velocity interpretation: <1.31 ft/sec, indicative of household ambulator General Gait Details: minA for steadying with RW, vc for proximity to RW and upright posture, pt with dizziness after 12 feet ambulation requesting to sit in recliner following behind, once in recliner pt request to get back to bed to conserve energy for d/c to daughters house this afternoon         Balance Overall balance assessment: Needs assistance Sitting-balance support: No upper extremity supported;Feet supported Sitting balance-Leahy Scale: Good     Standing balance support: Bilateral upper extremity supported Standing balance-Leahy Scale: Poor Standing balance comment: requires B UE support on RW                            Cognition Arousal/Alertness: Awake/alert Behavior During Therapy: WFL for tasks assessed/performed Overall Cognitive Status: Impaired/Different from baseline Area of Impairment: Problem solving;Safety/judgement                         Safety/Judgement: Decreased awareness of deficits;Decreased awareness of safety   Problem Solving: Slow processing General Comments: pt self limiting         General Comments  VSS      Pertinent Vitals/Pain Pain Assessment: No/denies pain           PT Goals (current goals can now be found in the care plan section) Acute Rehab PT Goals PT  Goal Formulation: With patient Time For Goal Achievement: 05/04/19 Potential to Achieve Goals: Fair    Frequency    Min 3X/week      PT Plan Current plan remains appropriate       AM-PAC PT "6 Clicks" Mobility   Outcome Measure  Help needed turning from your back to your side while in a flat bed without using bedrails?: A Little Help needed moving from lying on your back to sitting on the side of a flat bed without using  bedrails?: A Little Help needed moving to and from a bed to a chair (including a wheelchair)?: A Little Help needed standing up from a chair using your arms (e.g., wheelchair or bedside chair)?: A Little Help needed to walk in hospital room?: A Lot Help needed climbing 3-5 steps with a railing? : A Lot 6 Click Score: 16    End of Session Equipment Utilized During Treatment: Gait belt Activity Tolerance: Patient limited by fatigue(dizziness) Patient left: in bed;with call bell/phone within reach Nurse Communication: Mobility status PT Visit Diagnosis: Muscle weakness (generalized) (M62.81);Difficulty in walking, not elsewhere classified (R26.2);Pain     Time: 1411-1431 PT Time Calculation (min) (ACUTE ONLY): 20 min  Charges:  $Gait Training: 8-22 mins                     Michal Callicott B. Migdalia Dk PT, DPT Acute Rehabilitation Services Pager [phone] Office [phone]    [place] 05/06/2019, 2:46 PM

## 2019-05-07 MED FILL — LIDOCAINE PATCH 5%: 5 | 15 days supply | Qty: 15 | Fill #0

## 2019-05-10 ENCOUNTER — Ambulatory Visit: Payer: Self-pay | Admitting: Pharmacist

## 2019-05-10 ENCOUNTER — Ambulatory Visit: Payer: Self-pay

## 2019-05-10 ENCOUNTER — Other Ambulatory Visit: Payer: Self-pay

## 2019-05-10 DIAGNOSIS — Z79899 Other long term (current) drug therapy: Secondary | ICD-10-CM

## 2019-05-10 DIAGNOSIS — IMO0002 Reserved for concepts with insufficient information to code with codable children: Secondary | ICD-10-CM

## 2019-05-10 DIAGNOSIS — E1140 Type 2 diabetes mellitus with diabetic neuropathy, unspecified: Secondary | ICD-10-CM

## 2019-05-10 NOTE — Progress Notes (Addendum)
Transition Care Management Follow-up Telephone Call  Date discharged? 05/06/2019  How have you been since you were released from the hospital? improved  Do you understand why you were in the hospital? yes  Do you understand the discharge instructions? yes  Where were you discharged to? home  Items Reviewed:  Medications reviewed: yes  Allergies reviewed: yes  Dietary changes reviewed: n/a  Referrals reviewed: yes  MEDICATION ASSESSMENT: Date Medication Reconciliation Performed: 05/10/2019 Patient was recently discharged from hospital and all medications have been reviewed.   Medications:  New at Discharge: . Clarithromycin, amoxicillin, lansoprazole for H. Pylori treatment. Patient correctly states how she takes these and states she was able to pick up from pharmacy except for lansoprazole. Will work with patient for PPI affordability. Adjustments/discontinued at Discharge: . None  Patient reports access barriers, medication funding was provided and patient was referred to [facility] [place] pharmacy.  Medication Samples have been provided to the patient.  Drug: Levemir Strength: 100u/mL Qty: 1 vial LOT: [id number]  Exp.Date: 11/2019 Dosing instructions: 2-10 units 1-2x daily as needed per BG reading  Drug: amlodipine Strength: 10mg  Qty: 15  LOT: [id number] Exp.Date: 02/2020 Dosing instructions: take 1/2 pill once daily  The patient has been instructed regarding the correct time, dose, and frequency of taking this medication, including desired effects and most common side effects. Patient advised to contact clinic if any questions arise and verbalized understanding by repeat back.  Also in this visit we updated medication list to reflect patient's current regimen and started application for [facility].   [facility] 5:24 PM 05/10/2019   Functional Questionnaire:   Activities of Daily Living (ADLs):   She states they are independent in the following: feeding States they  require assistance with the following: ambulation and other ADLs  Any transportation issues/concerns?: yes  Any patient concerns? Yes, as reported above  Confirmed importance and date/time of follow-up visits scheduled yes  Provider Appointment booked with Dr [facility] 05/12/2019  Confirmed with patient if condition begins to worsen call PCP or go to the ER.  Patient was given the office number and encouraged to call back with question or concerns.  : yes

## 2019-05-11 ENCOUNTER — Telehealth: Payer: Self-pay | Admitting: Licensed Clinical Social Worker

## 2019-05-11 NOTE — Telephone Encounter (Signed)
Patient was contacted (2nd attempt) due to a referral from her PCP. Patient did not answer, and a vm was left for the patient.

## 2019-05-12 ENCOUNTER — Encounter: Payer: Self-pay | Admitting: Internal Medicine

## 2019-05-12 ENCOUNTER — Ambulatory Visit (INDEPENDENT_AMBULATORY_CARE_PROVIDER_SITE_OTHER): Payer: Self-pay | Admitting: Internal Medicine

## 2019-05-12 ENCOUNTER — Other Ambulatory Visit: Payer: Self-pay

## 2019-05-12 VITALS — BP 134/57 | HR 72 | Temp 99.2°F | Ht 62.0 in | Wt 146.5 lb

## 2019-05-12 DIAGNOSIS — E1140 Type 2 diabetes mellitus with diabetic neuropathy, unspecified: Secondary | ICD-10-CM

## 2019-05-12 DIAGNOSIS — Z8701 Personal history of pneumonia (recurrent): Secondary | ICD-10-CM

## 2019-05-12 DIAGNOSIS — J189 Pneumonia, unspecified organism: Secondary | ICD-10-CM

## 2019-05-12 DIAGNOSIS — Z79890 Hormone replacement therapy (postmenopausal): Secondary | ICD-10-CM

## 2019-05-12 DIAGNOSIS — N184 Chronic kidney disease, stage 4 (severe): Secondary | ICD-10-CM

## 2019-05-12 DIAGNOSIS — E890 Postprocedural hypothyroidism: Secondary | ICD-10-CM

## 2019-05-12 DIAGNOSIS — Z8719 Personal history of other diseases of the digestive system: Secondary | ICD-10-CM

## 2019-05-12 DIAGNOSIS — IMO0002 Reserved for concepts with insufficient information to code with codable children: Secondary | ICD-10-CM

## 2019-05-12 DIAGNOSIS — E1165 Type 2 diabetes mellitus with hyperglycemia: Secondary | ICD-10-CM

## 2019-05-12 DIAGNOSIS — Z79899 Other long term (current) drug therapy: Secondary | ICD-10-CM

## 2019-05-12 DIAGNOSIS — D649 Anemia, unspecified: Secondary | ICD-10-CM

## 2019-05-12 DIAGNOSIS — D62 Acute posthemorrhagic anemia: Secondary | ICD-10-CM

## 2019-05-12 DIAGNOSIS — Z9889 Other specified postprocedural states: Secondary | ICD-10-CM

## 2019-05-12 DIAGNOSIS — E1122 Type 2 diabetes mellitus with diabetic chronic kidney disease: Secondary | ICD-10-CM

## 2019-05-12 NOTE — Assessment & Plan Note (Addendum)
History of poorly controlled hypothyroidism. Patient was started back on her original dose of levothyroxine of 150 mcg. We will follow-up with TSH level in 4 weeks and adjust her medication appropriately.

## 2019-05-12 NOTE — Assessment & Plan Note (Addendum)
She was recently admitted for acute change in her chronic anemia.  She was found to have an upper GI bleed due to H. pylori infection and was treated with a 14-day course of triple therapy.  Patient denies any abdominal pain, melena, hematemesis, or dysphasia.  Plan: - Repeat CBC to assess hemoglobin levels

## 2019-05-12 NOTE — Progress Notes (Signed)
   CC: Hospital follow-up  HPI:  [name] is a 58 y.o. female with a past medical history stated below and noted today for hospital follow-up for upper GI bleed and community-acquired pneumonia. Please see problem based assessment and plan for additional details.   Past Medical History:  Diagnosis Date  . ANA POSITIVE 02/09/2010  . Anal warts 11/09/2012  . Anxiety   . Arthritis    "my bones ache all the time" (06/27/2014)  . Cellulitis 06/27/2014   medial proximal left thigh extending to the perineium  . Chronic bronchitis ([place])    "get it changing of the seasons; sometimes q yr; sometimes not"   . Complication of anesthesia    sats drop post op  . COPD (chronic obstructive pulmonary disease) ([place])   . 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 Diabetic peripheral neuropathy ([place]) since <2005   "hands and feet"  . Dizziness    Multiple falls at home  . Fibromyalgia   . Graves' disease    "they nuked it twice"  . [id number])    "monthly" (06/27/2014)  . Heart murmur    as a child-never had echo to evaluate  . Hyperlipidemia   . Hypertension   . Hypothyroidism   . Sickle cell trait ([place])   . Trimalleolar fracture of left ankle 11/21/2014  . Type 2 diabetes mellitus ([place])   . Wears glasses      Review of Systems: Review of Systems  Constitutional: Negative for chills, fever, malaise/fatigue and weight loss.  Eyes: Negative for blurred vision and double vision.  Respiratory: Negative for cough, sputum production and shortness of breath.   Cardiovascular: Positive for leg swelling (bilateral leg swelling left greater than right). Negative for chest pain and palpitations.  Gastrointestinal: Negative for abdominal pain, blood in stool, constipation, melena and vomiting (no hematemesis).  Neurological: Negative for dizziness, weakness and headaches.     Vitals:   05/12/19 1527  BP: (!) 134/57  Pulse: 72  Temp: 99.2 F (37.3 C)  TempSrc: Oral   SpO2: 96%  Weight: 146 lb 8 oz (66.5 kg)  Height: 5\' 2"  (1.575 m)     Physical Exam: Physical Exam  Constitutional: She is oriented to person, place, and time. She appears distressed.  Eyes: EOM are normal. No scleral icterus.  Exophthalmos present  Cardiovascular: Normal rate, regular rhythm, normal heart sounds and intact distal pulses. Exam reveals no gallop and no friction rub.  No murmur heard. Pulmonary/Chest: Effort normal. No respiratory distress. She has rales (lower lung fields). She exhibits tenderness.  Musculoskeletal:        General: Edema (3+ lower extremity edema) present.  Neurological: She is alert and oriented to person, place, and time.     Assessment & Plan:   See Encounters Tab for problem based charting.  Patient seen with Dr. Dareen Piano

## 2019-05-12 NOTE — Assessment & Plan Note (Signed)
Patient currently taking furosemide 40 mg but continues to have significant swelling in her bilateral extremities.  Refer her to nephrology in order to further manage her chronic kidney disease.

## 2019-05-12 NOTE — Patient Instructions (Addendum)
Thank you, Kayla [name] for allowing [place] to provide your care today. Today we discussed hospital follow up.    I have ordered kidney function, vitamin B12 and CBC labs for you. I will call if any are abnormal.    I have place a referrals to Nephrology for Chronic kidney disease   I have ordered the following tests: none   We changes the following medications: Continue taking your medications as prescribed. Please call me if you are not tolerating any of your medications.   Please follow-up in 4 weeks.    Should you have any questions or concerns please call the internal medicine clinic at [phone].    Marianna Payment, D.O. [facility]

## 2019-05-12 NOTE — Assessment & Plan Note (Signed)
Patient recently discharged from hospital visit for community-acquired pneumonia and symptomatic anemia.  Patient completed course of ceftriaxone and azithromycin for 5 days.  Patient is clinically improved but continues to have mild crackles in the lower lung fields.  She denies productive cough or shortness of breath.  Continues to use incentive spirometer.

## 2019-05-12 NOTE — Progress Notes (Signed)
Medication Samples have been provided to the patient.  Drug: esomeprazole DR capsules Strength: 20mg  Qty: 40 capsules LOT: [id number] Exp.Date: 09/2020 Dosing instructions: take 2 capsules by mouth once daily before meal. Can open capsule and sprinkle contents into applesauce.  The patient has been instructed regarding the correct time, dose, and frequency of taking this medication, including desired effects and most common side effects. Patient advised to contact clinic if any questions arise and verbalized understanding by repeat back.  [employer] 4:27 PM 05/12/2019

## 2019-05-12 NOTE — Assessment & Plan Note (Signed)
Patient will need follow-up counseling for diabetes management at a future visit.  Most recent hemoglobin A1c was 5% of July which is most likely falsely low due to her chronic anemia.  Patient will need intensive blood glucose monitoring.

## 2019-05-13 ENCOUNTER — Other Ambulatory Visit: Payer: Self-pay

## 2019-05-13 ENCOUNTER — Emergency Department (HOSPITAL_COMMUNITY): Payer: Self-pay

## 2019-05-13 ENCOUNTER — Telehealth: Payer: Self-pay | Admitting: Internal Medicine

## 2019-05-13 ENCOUNTER — Encounter: Payer: No Typology Code available for payment source | Attending: Psychology | Admitting: Psychology

## 2019-05-13 ENCOUNTER — Encounter (HOSPITAL_COMMUNITY): Payer: Self-pay | Admitting: Emergency Medicine

## 2019-05-13 ENCOUNTER — Emergency Department (HOSPITAL_COMMUNITY)
Admission: EM | Admit: 2019-05-13 | Discharge: 2019-05-13 | Disposition: A | Discharge status: Home / Self Care | Payer: Self-pay | Attending: Emergency Medicine | Admitting: Emergency Medicine

## 2019-05-13 DIAGNOSIS — Z87891 Personal history of nicotine dependence: Secondary | ICD-10-CM | POA: Insufficient documentation

## 2019-05-13 DIAGNOSIS — I129 Hypertensive chronic kidney disease with stage 1 through stage 4 chronic kidney disease, or unspecified chronic kidney disease: Secondary | ICD-10-CM | POA: Insufficient documentation

## 2019-05-13 DIAGNOSIS — R600 Localized edema: Secondary | ICD-10-CM | POA: Insufficient documentation

## 2019-05-13 DIAGNOSIS — E1165 Type 2 diabetes mellitus with hyperglycemia: Secondary | ICD-10-CM | POA: Insufficient documentation

## 2019-05-13 DIAGNOSIS — E871 Hypo-osmolality and hyponatremia: Secondary | ICD-10-CM | POA: Insufficient documentation

## 2019-05-13 DIAGNOSIS — Z79899 Other long term (current) drug therapy: Secondary | ICD-10-CM | POA: Insufficient documentation

## 2019-05-13 DIAGNOSIS — E039 Hypothyroidism, unspecified: Secondary | ICD-10-CM | POA: Insufficient documentation

## 2019-05-13 DIAGNOSIS — Z794 Long term (current) use of insulin: Secondary | ICD-10-CM | POA: Insufficient documentation

## 2019-05-13 DIAGNOSIS — N184 Chronic kidney disease, stage 4 (severe): Secondary | ICD-10-CM | POA: Insufficient documentation

## 2019-05-13 DIAGNOSIS — K922 Gastrointestinal hemorrhage, unspecified: Secondary | ICD-10-CM

## 2019-05-13 DIAGNOSIS — E1122 Type 2 diabetes mellitus with diabetic chronic kidney disease: Secondary | ICD-10-CM | POA: Insufficient documentation

## 2019-05-13 DIAGNOSIS — Z91040 Latex allergy status: Secondary | ICD-10-CM | POA: Insufficient documentation

## 2019-05-13 DIAGNOSIS — D649 Anemia, unspecified: Secondary | ICD-10-CM | POA: Insufficient documentation

## 2019-05-13 LAB — BMP8+ANION GAP
Anion Gap: 17 mmol/L (ref 10.0–18.0)
BUN/Creatinine Ratio: 14 (ref 9–23)
BUN: 41 mg/dL — ABNORMAL HIGH (ref 6–24)
CO2: 17 mmol/L — ABNORMAL LOW (ref 20–29)
Calcium: 7.6 mg/dL — ABNORMAL LOW (ref 8.7–10.2)
Chloride: 92 mmol/L — ABNORMAL LOW (ref 96–106)
Creatinine, Ser: 2.9 mg/dL — ABNORMAL HIGH (ref 0.57–1.00)
GFR calc Af Amer: 20 mL/min/{1.73_m2} — ABNORMAL LOW (ref >59)
GFR calc non Af Amer: 17 mL/min/{1.73_m2} — ABNORMAL LOW (ref >59)
Glucose: 658 mg/dL (ref 65–99)
Potassium: 4.1 mmol/L (ref 3.5–5.2)
Sodium: 126 mmol/L — ABNORMAL LOW (ref 134–144)

## 2019-05-13 LAB — CBC WITH DIFFERENTIAL/PLATELET
Abs Immature Granulocytes: 0.21 10*3/uL — ABNORMAL HIGH (ref 0.00–0.07)
Basophils Absolute: 0.2 10*3/uL — ABNORMAL HIGH (ref 0.0–0.1)
Basophils Relative: 1 %
Eosinophils Absolute: 1 10*3/uL — ABNORMAL HIGH (ref 0.0–0.5)
Eosinophils Relative: 5 %
HCT: 21.4 % — ABNORMAL LOW (ref 36.0–46.0)
Hemoglobin: 7 g/dL — ABNORMAL LOW (ref 12.0–15.0)
Immature Granulocytes: 1 %
Lymphocytes Relative: 8 %
Lymphs Abs: 1.5 10*3/uL (ref 0.7–4.0)
MCH: 29.4 pg (ref 26.0–34.0)
MCHC: 32.7 g/dL (ref 30.0–36.0)
MCV: 89.9 fL (ref 80.0–100.0)
Monocytes Absolute: 1.3 10*3/uL — ABNORMAL HIGH (ref 0.1–1.0)
Monocytes Relative: 7 %
Neutro Abs: 15 10*3/uL — ABNORMAL HIGH (ref 1.7–7.7)
Neutrophils Relative %: 78 %
Platelets: 183 10*3/uL (ref 150–400)
RBC: 2.38 MIL/uL — ABNORMAL LOW (ref 3.87–5.11)
RDW: 14.6 % (ref 11.5–15.5)
WBC: 19.2 10*3/uL — ABNORMAL HIGH (ref 4.0–10.5)
nRBC: 0 % (ref 0.0–0.2)

## 2019-05-13 LAB — COMPREHENSIVE METABOLIC PANEL
ALT: 16 U/L (ref 0–44)
AST: 14 U/L — ABNORMAL LOW (ref 15–41)
Albumin: 1.8 g/dL — ABNORMAL LOW (ref 3.5–5.0)
Alkaline Phosphatase: 429 U/L — ABNORMAL HIGH (ref 38–126)
Anion gap: 12 (ref 5–15)
BUN: 43 mg/dL — ABNORMAL HIGH (ref 6–20)
CO2: 19 mmol/L — ABNORMAL LOW (ref 22–32)
Calcium: 8 mg/dL — ABNORMAL LOW (ref 8.9–10.3)
Chloride: 97 mmol/L — ABNORMAL LOW (ref 98–111)
Creatinine, Ser: 3.63 mg/dL — ABNORMAL HIGH (ref 0.44–1.00)
GFR calc Af Amer: 15 mL/min — ABNORMAL LOW (ref >60)
GFR calc non Af Amer: 13 mL/min — ABNORMAL LOW (ref >60)
Glucose, Bld: 416 mg/dL — ABNORMAL HIGH (ref 70–99)
Potassium: 4 mmol/L (ref 3.5–5.1)
Sodium: 128 mmol/L — ABNORMAL LOW (ref 135–145)
Total Bilirubin: 0.6 mg/dL (ref 0.3–1.2)
Total Protein: 6 g/dL — ABNORMAL LOW (ref 6.5–8.1)

## 2019-05-13 LAB — CBC
Hematocrit: 20.4 % — ABNORMAL LOW (ref 34.0–46.6)
Hemoglobin: 6.4 g/dL — CL (ref 11.1–15.9)
MCH: 28.3 pg (ref 26.6–33.0)
MCHC: 31.4 g/dL — ABNORMAL LOW (ref 31.5–35.7)
MCV: 90 fL (ref 79–97)
Platelets: 165 10*3/uL (ref 150–450)
RBC: 2.26 x10E6/uL — CL (ref 3.77–5.28)
RDW: 13.1 % (ref 11.7–15.4)
WBC: 18.3 10*3/uL — ABNORMAL HIGH (ref 3.4–10.8)

## 2019-05-13 LAB — CBG MONITORING, ED
Glucose-Capillary: 333 mg/dL — ABNORMAL HIGH (ref 70–99)
Glucose-Capillary: 285 mg/dL — ABNORMAL HIGH (ref 70–99)

## 2019-05-13 LAB — PROTIME-INR
INR: 1.1 (ref 0.8–1.2)
Prothrombin Time: 13.9 seconds (ref 11.4–15.2)

## 2019-05-13 LAB — PREPARE RBC (CROSSMATCH)

## 2019-05-13 LAB — VITAMIN B12: Vitamin B-12: 1758 pg/mL — ABNORMAL HIGH (ref 232–1245)

## 2019-05-13 LAB — BRAIN NATRIURETIC PEPTIDE: B Natriuretic Peptide: 135.8 pg/mL — ABNORMAL HIGH (ref 0.0–100.0)

## 2019-05-13 MED ORDER — SODIUM CHLORIDE 0.9 % IV SOLN
510.0000 mg | Freq: Once | INTRAVENOUS | Status: DC
  Filled 2019-05-13: qty 17

## 2019-05-13 MED ORDER — DARBEPOETIN ALFA 40 MCG/0.4ML IJ SOSY: 30.0000 ug | PREFILLED_SYRINGE | Freq: Once | INTRAMUSCULAR | Status: DC

## 2019-05-13 MED ORDER — INSULIN ASPART 100 UNIT/ML ~~LOC~~ SOLN
10.0000 [IU] | Freq: Once | SUBCUTANEOUS | Status: AC
  Administered 2019-05-13: 10 [IU] via SUBCUTANEOUS

## 2019-05-13 MED ORDER — SODIUM CHLORIDE 0.9% IV SOLUTION: Freq: Once | INTRAVENOUS | Status: DC

## 2019-05-13 NOTE — ED Notes (Signed)
Paged MD regarding Aranesp and Feraheme.

## 2019-05-13 NOTE — ED Provider Notes (Addendum)
[facility] EMERGENCY DEPARTMENT Provider Note   CSN: [id number] Arrival date & time: 05/13/19  1000     History   Chief Complaint Chief Complaint  Patient presents with  . Abnormal Lab    HPI Kayla [name] is a 58 y.o. female who presents to the ED with cc of anemia. She was sent in by her doctor at the internal medicine resident clinic after labs showed a precipitous drop  In her hgb. She  has a past medical history of ANA POSITIVE (02/09/2010), Anal warts (11/09/2012), Anxiety, Arthritis, Cellulitis (06/27/2014), Chronic bronchitis ([place]), Complication of anesthesia, COPD (chronic obstructive pulmonary disease) ([place]), Deafness in right ear (since 2012), Diabetic peripheral neuropathy ([place]) (since <2005), Dizziness, Fibromyalgia, Graves' disease, Headache(784.0), Heart murmur, Hyperlipidemia, Hypertension, Hypothyroidism, Sickle cell trait ([place]), Trimalleolar fracture of left ankle (11/21/2014), Type 2 diabetes mellitus ([place]), and Wears glasses. She was admitted for multifactorial symptomatic anemia and GI bleed and discharged on 05/06/2019. EGD showed  Esophagitis, non-bleeding duodenal ulcer and bleeding gastritis from H-pylori infection. Received 14 day course of triple therapy. She continues on PPI. Patient also developed nosocomial Pneumonia infection during hospitalization. She denies any active abdominal pain. She feels weak and c/o severe peripheral edema which is limiting her ability to ambulate.     HPI  Past Medical History:  Diagnosis Date  . ANA POSITIVE 02/09/2010  . Anal warts 11/09/2012  . Anxiety   . Arthritis    "my bones ache all the time" (06/27/2014)  . Cellulitis 06/27/2014   medial proximal left thigh extending to the perineium  . Chronic bronchitis ([place])    "get it changing of the seasons; sometimes q yr; sometimes not"   . Complication of anesthesia    sats drop post op  . COPD (chronic obstructive pulmonary disease) ([place])   . 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 Diabetic peripheral neuropathy ([place]) since <2005   "hands and feet"  . Dizziness    Multiple falls at home  . Fibromyalgia   . Graves' disease    "they nuked it twice"  . JMEQASTM(196.2)    "monthly" (06/27/2014)  . Heart murmur    as a child-never had echo to evaluate  . Hyperlipidemia   . Hypertension   . Hypothyroidism   . Sickle cell trait ([place])   . Trimalleolar fracture of left ankle 11/21/2014  . Type 2 diabetes mellitus ([place])   . Wears glasses     Patient Active Problem List   Diagnosis Date Noted  . CAP (community acquired pneumonia) 04/29/2019  . Nausea without vomiting   . Anorexia   . Weakness 04/26/2019  . Symptomatic anemia 04/26/2019  . Urinary urgency 02/26/2019  . Vaginal discharge 02/26/2019  . Rash of body 02/26/2019  . Medication management 05/14/2018  . Right upper quadrant abdominal pain 05/04/2018  . Thrush, oral 05/04/2018  . Trigger finger, left middle finger 12/15/2017  . Pain and swelling of left ankle 08/07/2017  . Yeast vaginitis 10/21/2016  . Hypercalcemia 11/28/2015  . Eczema 11/27/2015  . Elevated alkaline phosphatase level 06/27/2014  . CKD (chronic kidney disease) stage 4, GFR 15-29 ml/min (HCC) 06/27/2014  . Unspecified vitamin D deficiency 04/20/2013  . Essential hypertension, benign 02/15/2013  . AGUS favor benign 09/04/2012  . Multiple falls 08/17/2012  . Healthcare maintenance 08/17/2012  . Fibromyalgia syndrome 07/21/2012  . PAP SMEAR, ABNORMAL 06/26/2009  . OBESITY, MILD 09/08/2008  . HLD (hyperlipidemia) 03/30/2007  .  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 DM type 2, uncontrolled, with severe neuropathy 07/31/2006  . SICKLE-CELL TRAIT 07/31/2006  . Major depressive disorder 07/31/2006  . SHOULDER PAIN 07/31/2006  . Insomnia 07/31/2006  . PROTEINURIA 07/31/2006    Past Surgical History:  Procedure Laterality Date  . ANKLE FRACTURE SURGERY Left 2008   "didn't  put hardware in"  . APPENDECTOMY  1986  . BIOPSY  04/28/2019   Procedure: BIOPSY;  Surgeon: Jerene Bears, MD;  Location[facility] ENDOSCOPY;  Service: Gastroenterology;;  . [place]  . DILATION AND CURETTAGE OF UTERUS  1990's  . ESOPHAGOGASTRODUODENOSCOPY (EGD) WITH PROPOFOL N/A 04/28/2019   Procedure: ESOPHAGOGASTRODUODENOSCOPY (EGD) WITH PROPOFOL;  Surgeon: Jerene Bears, MD;  Location: [facility];  Service: Gastroenterology;  Laterality: N/A;  . FRACTURE SURGERY    . INCISION AND DRAINAGE ABSCESS Left 07/01/2014   Procedure: INCISION AND DRAINAGE ABSCESS OF LEFT THIGH;  Surgeon: Georganna Skeans, MD;  Location: [facility];  Service: General;  Laterality: Left;  . ORIF ANKLE FRACTURE Left 11/21/2014   Procedure: OPEN REDUCTION INTERNAL FIXATION (ORIF) TRI MALLEOLAR FRACTURE with retinacular repair;  Surgeon: Johnny Bridge, MD;  Location: [facility];  Service: Orthopedics;  Laterality: Left;     OB History    Gravida  3   Para  2   Term  2   Preterm  0   AB  1   Living  2     SAB  0   TAB  1   Ectopic  0   Multiple  0   Live Births               Home Medications    Prior to Admission medications   Medication Sig Start Date End Date Taking? Authorizing Provider  amLODipine (NORVASC) 5 MG tablet Take 1 tablet (5 mg total) by mouth daily. 11/30/18   Asencion Noble, MD  amoxicillin (AMOXIL) 250 MG/5ML suspension Take 10 mLs (500 mg total) by mouth every 12 (twelve) hours for 11 days. 05/06/19 05/17/19  Ladona Horns, MD  Blood Glucose Monitoring Suppl (ACCU-CHEK AVIVA PLUS) W/DEVICE KIT 1 kit by Does not apply route as directed. 05/15/11   Bartholomew Crews, MD  clarithromycin Binnie Rail) 250 MG/5ML suspension Take 5 mLs (250 mg total) by mouth every 12 (twelve) hours for 11 days. 05/06/19 05/17/19  Ladona Horns, MD  DULoxetine (CYMBALTA) 60 MG capsule Take 1 capsule (60 mg total) by mouth daily. 10/21/16   Dellia Nims, MD  furosemide (LASIX) 40 MG  tablet Take 0.5 tablets (20 mg total) by mouth daily. 05/06/19 05/05/20  Ladona Horns, MD  glucose blood (ACCU-CHEK AVIVA PLUS) test strip Use as instructed 10/21/16   Dellia Nims, MD  Insulin Glargine (BASAGLAR KWIKPEN) 100 UNIT/ML SOPN Inject 0.2 mLs (20 Units total) into the skin daily. 03/24/18   Forde Dandy, PharmD  ketoconazole (NIZORAL) 2 % shampoo Apply 1 application topically 2 (two) times a week. 03/01/19   Lorella Nimrod, MD  Lancet [facility]) lancets Use as instructed 10/21/16   Dellia Nims, MD  lansoprazole (PREVACID) 30 MG capsule Take 1 capsule (30 mg total) by mouth 2 (two) times daily before a meal for 20 days, THEN 1 capsule (30 mg total) daily for 10 days. 05/06/19 06/05/19  Ladona Horns, MD  levothyroxine (SYNTHROID) 150 MCG tablet Take 1 tablet (150 mcg total) by mouth daily before breakfast. 02/26/19   Lorella Nimrod, MD  lidocaine ([place])  5 % Place 1 patch onto the skin daily. Remove & Discard patch within 12 hours or as directed by MD 05/07/19   Ladona Horns, MD  polyethylene glycol (MIRALAX / GLYCOLAX) 17 g packet Take 17 g by mouth daily. Patient not taking: Reported on 05/10/2019 05/06/19   Ladona Horns, MD  sennosides (SENOKOT) 8.8 MG/5ML syrup Take 5 mLs by mouth 2 (two) times daily. Patient not taking: Reported on 05/10/2019 05/06/19   Ladona Horns, MD  sodium bicarbonate 650 MG tablet Take 2 tablets (1,300 mg total) by mouth 3 (three) times daily. 05/06/19 06/05/19  Ladona Horns, MD  topiramate (TOPAMAX) 25 MG capsule Take 25 mg by mouth 2 (two) times daily. Take 1 in the morning, 2 at night    [provider]  Vitamin D, Ergocalciferol, (DRISDOL) 50000 units CAPS capsule Take 1 capsule (50,000 Units total) by mouth every 7 (seven) days. Patient not taking: Reported on 12/22/2017 10/25/16   Dellia Nims, MD    Family History Family History  Problem Relation Age of Onset  . Emphysema Mother   . Cancer Mother        unknown  . Hypertension Mother   .  Cancer Father        pancreatic   . Diabetes Father   . Hypertension Father   . Down syndrome Brother   . Cancer Maternal Aunt        cancer  . Cancer Other        breast cancer aunt  . Stroke Sister   . Diabetes Sister   . Colon cancer Neg Hx   . Stomach cancer Neg Hx     Social History Social History   Tobacco Use  . Smoking status: Former Smoker    Packs/day: 0.75    Years: 5.00    Pack years: 3.75    Types: Cigarettes    Quit date: 09/30/1990    Years since quitting: 28.6  . Smokeless tobacco: Never Used  Substance Use Topics  . Alcohol use: No    Alcohol/week: 0.0 standard drinks  . Drug use: No     Allergies   Ace inhibitors, Percocet [oxycodone-acetaminophen], Statins, Latex, Sulfamethoxazole, and Sulfites   Review of Systems Review of Systems  Ten systems reviewed and are negative for acute change, except as noted in the HPI.   Physical Exam Updated Vital Signs BP 131/65   Pulse 85   Temp 99 F (37.2 C) (Oral)   Resp 18   LMP 01/12/2014   SpO2 98%   Physical Exam Vitals signs and nursing note reviewed.  Constitutional:      General: She is not in acute distress.    Appearance: She is well-developed. She is ill-appearing. She is not diaphoretic.     Comments: Appears chronically ill  HENT:     Head: Normocephalic and atraumatic.     Mouth/Throat:     Mouth: Mucous membranes are moist.  Eyes:     General: No scleral icterus.    Conjunctiva/sclera: Conjunctivae normal.  Neck:     Musculoskeletal: Normal range of motion.  Cardiovascular:     Rate and Rhythm: Normal rate and regular rhythm.     Heart sounds: Murmur present. No friction rub. No gallop.   Pulmonary:     Effort: Pulmonary effort is normal. No respiratory distress.     Breath sounds: Rales present.  Abdominal:     General: Bowel sounds are normal. There is no distension.  Palpations: Abdomen is soft. There is no mass.     Tenderness: There is no abdominal tenderness. There  is no guarding.  Musculoskeletal:     Right lower leg: Edema present.     Left lower leg: Edema present.  Skin:    General: Skin is warm and dry.  Neurological:     Mental Status: She is alert and oriented to person, place, and time.  Psychiatric:        Behavior: Behavior normal.      ED Treatments / Results  Labs (all labs ordered are listed, but only abnormal results are displayed) Labs Reviewed  CBC WITH DIFFERENTIAL/PLATELET - Abnormal; Notable for the following components:      Result Value   WBC 19.2 (*)    RBC 2.38 (*)    Hemoglobin 7.0 (*)    HCT 21.4 (*)    Neutro Abs 15.0 (*)    Monocytes Absolute 1.3 (*)    Eosinophils Absolute 1.0 (*)    Basophils Absolute 0.2 (*)    Abs Immature Granulocytes 0.21 (*)    All other components within normal limits  PROTIME-INR  COMPREHENSIVE METABOLIC PANEL  BRAIN NATRIURETIC PEPTIDE  TYPE AND SCREEN    EKG None  Radiology No results found.  Procedures .Critical Care Performed by: Margarita Mail, PA-C Authorized by: Margarita Mail, PA-C   Critical care provider statement:    Critical care time (minutes):  40   Critical care was necessary to treat or prevent imminent or life-threatening deterioration of the following conditions:  Circulatory failure   Critical care was time spent personally by me on the following activities:  Discussions with consultants, evaluation of patient's response to treatment, examination of patient, ordering and performing treatments and interventions, ordering and review of laboratory studies, ordering and review of radiographic studies, pulse oximetry, re-evaluation of patient's condition, obtaining history from patient or surrogate and review of old charts   (including critical care time)  Medications Ordered in ED Medications - No data to display   Initial Impression / Assessment and Plan / ED Course  I have reviewed the triage vital signs and the nursing notes.  Pertinent labs &  imaging results that were available during my care of the patient were reviewed by me and considered in my medical decision making (see chart for details).  Clinical Course as of May 12 1336  Thu May 13, 2019  1104 Comprehensive metabolic panel(!) [AH]    Clinical Course User Index [AH] Margarita Mail, PA-C       3:10 PM Patient here with precipitous drop in her hemoglobin, hyperglycemia.  She was sent in by her PCP.  I personally reviewed the patient's work-up today which shows hyperglycemia, hyponatremia which is corrected from 1 28-1 35 when you account for the elevated blood sugar.  Her hemoglobin is 7 and was greater than 10 several days ago.  Patient's PT/INR is within normal limits.  Her type and screen has resulted.  She was consulted on here in the emergency department by the internal medicine teaching service.  The patient does not want to be admitted and they agree with plan.  She will get insulin, fluids and 1 unit of packed red blood cells here on the floor and then may be discharged.  I have given signout to Dr. Dorian Heckle who will  Final Clinical Impressions(s) / ED Diagnoses   Final diagnoses:  None    ED Discharge Orders    None  Margarita Mail, PA-C 05/13/19 1522    Sherwood Gambler, MD 05/13/19 1523    Margarita Mail, PA-C 05/28/19 1729    Sherwood Gambler, MD 05/28/19 [phone]

## 2019-05-13 NOTE — Telephone Encounter (Signed)
Called and discussed the critical lab with the patient and counseled her on the importance of calling 911 and being readmitted to the hospital due to acute drop in her hemoglobin from 10 to 6.4 and is being discharged 10 days ago and significantly elevated blood glucose level of 658 mg/dL.  Stated that she would come back to the hospital but only to receive blood. She does not want any other diagnostic testing while admitted he would not come back to the hospital.  Patient stated she will call 911 and get EMS to bring her to the hospital.  Marianna Payment, DO

## 2019-05-13 NOTE — ED Notes (Signed)
MD winfrey made aware pt unable to get aranesp and feraheme in the ED. Per MD, pt will get these outpatient.

## 2019-05-13 NOTE — ED Notes (Signed)
Rate increased to 500cc/hr per PA. Pt in NAD.

## 2019-05-13 NOTE — Consult Note (Addendum)
Date: 05/13/2019               Patient Name:  Thomas Rhude MRN: [id number]  DOB: Dec 05, 1960 Age / Sex: 58 y.o., female   PCP: Asencion Noble, MD         Requesting Physician: Dr. Sherwood Gambler, MD    Consulting Reason:  Anemia and hyperglycemia     Chief Complaint: abnormal lab  History of Present Illness: Ms. Munar is a 58 year old lady with a significant PMH of a recent hospitalization for an upper GI bleed found to be due to H pylori infection and CAP. She was seen in the clinic yesterday for hospital follow-up. Follow-up lab work revealed a Hgb of 6.4 and glucose of 658, so the pt was notified to come into the ED. In the ED, repeat blood work significant for Hgb 7.0, normal PT 13.0 and INR 1.1, and glucose was 416.  Pt was elevated at the bedside. She is well known to the internal medicine teaching service. She has a PMH of HTN, T2DM, hypothyroidism, CKDstage IV, sickle cell trait, MDD, and fibromyalgia. Pt states she feels the as she did at her discharge 1 week ago. She denies dark or blood in her stools. Denies abdominal pain, nausea, or vomiting. Endorses that she has a few more doses of her antibiotic to take. States she has been taking her PPI everyday by opening the capsule and mixing it into applesauce.   Pt is very short in her answers. Repeating that "Dr. Marianna Payment is my doctor and y'all are just trying to mess with me." She says she does not want to be admitted to the hospital, and she is here to simply get blood.    Meds: Current Facility-Administered Medications  Medication Dose Route Frequency Provider Last Rate Last Dose  . 0.9 %  sodium chloride infusion (Manually program via Guardrails IV Fluids)   Intravenous Once Katherine Roan, MD      . insulin aspart (novoLOG) injection 10 Units  10 Units Subcutaneous Once Katherine Roan, MD       Current Outpatient Medications  Medication Sig Dispense Refill  . amLODipine (NORVASC) 5 MG tablet Take 1 tablet (5 mg total) by  mouth daily. (Patient taking differently: Take 2.5 mg by mouth daily. ) 30 tablet 3  . amoxicillin (AMOXIL) 250 MG/5ML suspension Take 10 mLs (500 mg total) by mouth every 12 (twelve) hours for 11 days. 220 mL 0  . clarithromycin (BIAXIN) 250 MG/5ML suspension Take 5 mLs (250 mg total) by mouth every 12 (twelve) hours for 11 days. 100 mL 0  . DULoxetine (CYMBALTA) 60 MG capsule Take 1 capsule (60 mg total) by mouth daily. 30 capsule 1  . furosemide (LASIX) 40 MG tablet Take 0.5 tablets (20 mg total) by mouth daily. 30 tablet 1  . gabapentin (NEURONTIN) 600 MG tablet Take 600 mg by mouth 3 (three) times daily as needed (pain).    . Insulin Glargine (BASAGLAR KWIKPEN) 100 UNIT/ML SOPN Inject 0.2 mLs (20 Units total) into the skin daily. (Patient taking differently: Inject 5-10 Units into the skin at bedtime. ) 18 mL 3  . ketoconazole (NIZORAL) 2 % shampoo Apply 1 application topically 2 (two) times a week. (Patient taking differently: Apply 1 application topically 2 (two) times a week. On skin) 120 mL 0  . levothyroxine (SYNTHROID) 150 MCG tablet Take 1 tablet (150 mcg total) by mouth daily before breakfast. (Patient taking differently: Take 150 mcg by mouth at bedtime. )  30 tablet 4  . lidocaine (LIDODERM) 5 % Place 1 patch onto the skin daily. Remove & Discard patch within 12 hours or as directed by MD 15 patch 0  . sodium bicarbonate 650 MG tablet Take 2 tablets (1,300 mg total) by mouth 3 (three) times daily. 180 tablet 0  . topiramate (TOPAMAX) 25 MG capsule Take 25-50 mg by mouth See admin instructions. Take 25 mg  in the morning, 50 mg at night    . Blood Glucose Monitoring Suppl (ACCU-CHEK AVIVA PLUS) W/DEVICE KIT 1 kit by Does not apply route as directed. 1 kit 0  . glucose blood (ACCU-CHEK AVIVA PLUS) test strip Use as instructed 100 each 12  . Lancet Devices (ACCU-CHEK SOFTCLIX) lancets Use as instructed 1 each 0  . lansoprazole (PREVACID) 30 MG capsule Take 1 capsule (30 mg total) by mouth 2  (two) times daily before a meal for 20 days, THEN 1 capsule (30 mg total) daily for 10 days. 50 capsule 0  . polyethylene glycol (MIRALAX / GLYCOLAX) 17 g packet Take 17 g by mouth daily. (Patient not taking: Reported on 05/10/2019) 14 each 0  . sennosides (SENOKOT) 8.8 MG/5ML syrup Take 5 mLs by mouth 2 (two) times daily. (Patient not taking: Reported on 05/10/2019) 240 mL 0  . Vitamin D, Ergocalciferol, (DRISDOL) 50000 units CAPS capsule Take 1 capsule (50,000 Units total) by mouth every 7 (seven) days. (Patient not taking: Reported on 12/22/2017) 12 capsule 0    Allergies: Allergies as of 05/13/2019 - Review Complete 05/13/2019  Allergen Reaction Noted  . Ace inhibitors  07/31/2006  . Percocet [oxycodone-acetaminophen] Itching 06/27/2014  . Statins  03/05/2013  . Latex Rash 10/21/2016  . Sulfamethoxazole Rash 06/27/2014  . Sulfites Rash 03/04/2012   Past Medical History:  Diagnosis Date  . ANA POSITIVE 02/09/2010  . Anal warts 11/09/2012  . Anxiety   . Arthritis    "my bones ache all the time" (06/27/2014)  . Cellulitis 06/27/2014   medial proximal left thigh extending to the perineium  . Chronic bronchitis ([place])    "get it changing of the seasons; sometimes q yr; sometimes not"   . Complication of anesthesia    sats drop post op  . COPD (chronic obstructive pulmonary disease) ([place])   . 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 Diabetic peripheral neuropathy ([place]) since <2005   "hands and feet"  . Dizziness    Multiple falls at home  . Fibromyalgia   . Graves' disease    "they nuked it twice"  . KYHCWCBJ(628.3)    "monthly" (06/27/2014)  . Heart murmur    as a child-never had echo to evaluate  . Hyperlipidemia   . Hypertension   . Hypothyroidism   . Sickle cell trait ([place])   . Trimalleolar fracture of left ankle 11/21/2014  . Type 2 diabetes mellitus ([place])   . Wears glasses    Past Surgical History:  Procedure Laterality Date  . ANKLE FRACTURE SURGERY  Left 2008   "didn't put hardware in"  . APPENDECTOMY  1986  . BIOPSY  04/28/2019   Procedure: BIOPSY;  Surgeon: Jerene Bears, MD;  Location: [facility] ENDOSCOPY;  Service: Gastroenterology;;  . [place]  . DILATION AND CURETTAGE OF UTERUS  1990's  . ESOPHAGOGASTRODUODENOSCOPY (EGD) WITH PROPOFOL N/A 04/28/2019   Procedure: ESOPHAGOGASTRODUODENOSCOPY (EGD) WITH PROPOFOL;  Surgeon: Jerene Bears, MD;  Location: [facility];  Service: Gastroenterology;  Laterality:  N/A;  . FRACTURE SURGERY    . INCISION AND DRAINAGE ABSCESS Left 07/01/2014   Procedure: INCISION AND DRAINAGE ABSCESS OF LEFT THIGH;  Surgeon: Georganna Skeans, MD;  Location: [facility];  Service: General;  Laterality: Left;  . ORIF ANKLE FRACTURE Left 11/21/2014   Procedure: OPEN REDUCTION INTERNAL FIXATION (ORIF) TRI MALLEOLAR FRACTURE with retinacular repair;  Surgeon: Johnny Bridge, MD;  Location: [facility];  Service: Orthopedics;  Laterality: Left;   Family History  Problem Relation Age of Onset  . Emphysema Mother   . Cancer Mother        unknown  . Hypertension Mother   . Cancer Father        pancreatic   . Diabetes Father   . Hypertension Father   . Down syndrome Brother   . Cancer Maternal Aunt        cancer  . Cancer Other        breast cancer aunt  . Stroke Sister   . Diabetes Sister   . Colon cancer Neg Hx   . Stomach cancer Neg Hx    Social History   Socioeconomic History  . Marital status: Single    Spouse name: Not on file  . Number of children: Not on file  . Years of education: 17  . Highest education level: Not on file  Occupational History    Employer: UNEMPLOYED  Social Needs  . Financial resource strain: Not on file  . Food insecurity    Worry: Not on file    Inability: Not on file  . Transportation needs    Medical: Not on file    Non-medical: Not on file  Tobacco Use  . Smoking status: Former Smoker    Packs/day: 0.75    Years: 5.00    Pack years: 3.75     Types: Cigarettes    Quit date: 09/30/1990    Years since quitting: 28.6  . Smokeless tobacco: Never Used  Substance and Sexual Activity  . Alcohol use: No    Alcohol/week: 0.0 standard drinks  . Drug use: No  . Sexual activity: Not Currently  Lifestyle  . Physical activity    Days per week: Not on file    Minutes per session: Not on file  . Stress: Not on file  Relationships  . Social Herbalist on phone: Not on file    Gets together: Not on file    Attends religious service: Not on file    Active member of club or organization: Not on file    Attends meetings of clubs or organizations: Not on file    Relationship status: Not on file  . Intimate partner violence    Fear of current or ex partner: Not on file    Emotionally abused: Not on file    Physically abused: Not on file    Forced sexual activity: Not on file  Other Topics Concern  . Not on file  Social History Narrative   Lives in [place], alone. Looking for a job. Has a son in town who checks in on her.    Review of Systems: Review of Systems  Constitutional: Negative for chills and fever.  Respiratory: Negative for cough and shortness of breath.   Cardiovascular: Negative for chest pain.  Gastrointestinal: Negative for abdominal pain, blood in stool, melena, nausea and vomiting.  Genitourinary: Negative.   Musculoskeletal:       Chronic joint pains (specifically L ankle) and  L chest wall/axilla pain  Skin: Negative.   Neurological: Negative for dizziness, weakness and headaches.    Physical Exam: Blood pressure 133/75, pulse 86, temperature 99 F (37.2 C), temperature source Oral, resp. rate (!) 25, last menstrual period 01/12/2014, SpO2 98 %. Physical Exam Vitals signs reviewed.  Constitutional:      General: She is not in acute distress. HENT:     Head: Normocephalic and atraumatic.  Cardiovascular:     Rate and Rhythm: Normal rate and regular rhythm.     Heart sounds: Normal heart sounds.  No murmur. No friction rub. No gallop.   Pulmonary:     Effort: Pulmonary effort is normal. No tachypnea or respiratory distress.     Breath sounds: No decreased air movement. No wheezing, rhonchi or rales.     Comments: Crackles at the bases bilaterally. Abdominal:     General: Bowel sounds are normal. There is no distension.     Palpations: Abdomen is soft.     Tenderness: There is no abdominal tenderness.  Musculoskeletal:     Right lower leg: Edema present.     Left lower leg: Edema present.  Skin:    General: Skin is warm and dry.  Neurological:     General: No focal deficit present.     Mental Status: She is alert and oriented to person, place, and time.  Psychiatric:        Mood and Affect: Affect is angry.        Behavior: Behavior is uncooperative.     Lab results: CBC Latest Ref Rng & Units 05/13/2019 05/12/2019 05/03/2019  WBC 4.0 - 10.5 K/uL 19.2(H) 18.3(H) 22.0(H)  Hemoglobin 12.0 - 15.0 g/dL 7.0(L) 6.4(LL) 10.0(L)  Hematocrit 36.0 - 46.0 % 21.4(L) 20.4(L) 28.8(L)  Platelets 150 - 400 K/uL 183 165 99(L)   BMP Latest Ref Rng & Units 05/13/2019 05/12/2019 05/03/2019  Glucose 70 - 99 mg/dL 416(H) 658(HH) 68(L)  BUN 6 - 20 mg/dL 43(H) 41(H) 64(H)  Creatinine 0.44 - 1.00 mg/dL 3.63(H) 2.90(H) 5.04(H)  BUN/Creat Ratio 9 - 23 - 14 -  Sodium 135 - 145 mmol/L 128(L) 126(L) 134(L)  Potassium 3.5 - 5.1 mmol/L 4.0 4.1 4.9  Chloride 98 - 111 mmol/L 97(L) 92(L) 108  CO2 22 - 32 mmol/L 19(L) 17(L) 14(L)  Calcium 8.9 - 10.3 mg/dL 8.0(L) 7.6(L) 9.2    Imaging results:  Dg Chest Port 1 View  Result Date: 05/13/2019 CLINICAL DATA:  Shortness of breath. EXAM: PORTABLE CHEST 1 VIEW COMPARISON:  April 28, 2019 FINDINGS: The cardiac silhouette is enlarged. Persistent patchy bilateral airspace consolidations with left lung and lower lobe predominance. Possible small left pleural effusions. Osseous structures are without acute abnormality. Soft tissues are grossly normal. IMPRESSION: 1.  Persistent patchy bilateral airspace consolidations with left lung and lower lobe predominance. 2. Possible small left pleural effusion. Electronically Signed   By: Fidela Salisbury M.D.   On: 05/13/2019 11:36   Other results: EKG: normal sinus rhythm, unchanged from previous tracings.  Assessment, Plan, & Recommendations by Problem: Ms. Marte is a 58 year old lady with a significant PMH of a recent hospitalization for an upper GI bleed found to be due to H pylori infection and CAP, who presented for abnormal lab work most significant for Hgb 7.0 and glucose was 416.   Acute on chronic anemia Evaluated previously for anemia on last hospitalization, found to be multifactorial due to gastric ulcer from H pylori infection and low production from CKD.  -  transfuse 1 unit pRBCs and recheck Hgb  - IV iron - 31mg aranesp - continue PPI therapy BID - questionable adherence to triple therapy as course was due to be completed on 8/10 - follow-up with GI   - f/u endoscopy  - confirm H pylori eradication  - follow-up with nephrology to reschedule missed appointment today  - pt due to be set up with outpatient ESA injections and iron store surveillance  Hyperglycemia Pt reports not taking insulin at home because she had been given syringes previously when she prefers the pen injectors. - novolog 10 units - follow-up with PCP in the next week for repeat lab work and continued diabetes education  Medication Samples have been provided to the patient. Drug name: NovoLog Mix 70/30 FlexPen      Strength: 70/30        Qty: 1 pen   LOT: [id number] Exp.Date: 08/2019 Dosing instructions: Take 7 units with meals The patient has been instructed regarding the correct time, dose, and frequency of taking this medication, including desired effects and most common side effects. Also given 16 pen tips.    ELadona Horns2:40 PM 05/13/2019   Signed: JLadona Horns MD 05/13/2019, 12:51 PM  Pager -  [phone]

## 2019-05-13 NOTE — ED Provider Notes (Signed)
Care assumed patient from [place] at 3:00.  Patient presents after being sent in by PCP for precipitous drop in hemoglobin.  Had recent admission for symptomatic anemia with a hemoglobin of 4.  Work-up notable for gastritis with upper GI bleed.  Also ESRD.  Internal medicine service has reconsulted in the emergency department.  After discussion with the patient they recommend transfusion but do not feel that admission is required to treat her current complaints given the chronic nature.  Patient is in agreement with this.  Plan at the time of handoff is to correct hyperglycemia with subcutaneous insulin.  Will reassess and discharge for outpatient follow-up.  Update: Reevaluated patient.  She has not had any decline in clinical status.  Reviewed the plan to her that was communicated with myself and she confirmed that she was in agreement.  Overall she is hemodynamically stable and internal medicine will facilitate close outpatient follow-up, there is no indication for additional treatment in the emergency department.  Her glucose is below 300.  Will discharge home in accordance with the plan of the upcoming team.   Tillie Fantasia, MD 05/13/19 1552    Quintella Reichert, MD 05/13/19 [phone]

## 2019-05-13 NOTE — ED Notes (Signed)
ptar called by dee pt is number 4 on the list  

## 2019-05-13 NOTE — ED Triage Notes (Signed)
Pt arrives by [facility] from home after her doctor called and told her she needed a blood transfusion after having blood drawn yesterday noted to be anemic. At home pt had room air sat of 85% and was placed on 2L [place]. Pt was recently admitted recently for the same issue pt reports getting pneumonia while in the hospital and went home on Thursday.

## 2019-05-14 LAB — TYPE AND SCREEN
ABO/RH(D): O POS
Antibody Screen: NEGATIVE
Unit division: 0

## 2019-05-14 LAB — BPAM RBC
Blood Product Expiration Date: 202009072359
ISSUE DATE / TIME: 202008131333
Unit Type and Rh: 5100

## 2019-05-14 NOTE — Progress Notes (Signed)
Internal Medicine Clinic Attending  I saw and evaluated the patient.  I personally confirmed the key portions of the history and exam documented by Dr. Marianna Payment and I reviewed pertinent patient test results.  The assessment, diagnosis, and plan were formulated together and I agree with the documentation in the resident's note.      Patient was noted to have severe hyperglycemia and worsening anemia in the setting of a recent GI bleed. She was sent to the ED for further evaluation. She received PRBC transfusion and she also received treatment for her hyperglycemia and was dc'd home as she did not want to be admitted. She will have close follow up with [facility]

## 2019-05-17 ENCOUNTER — Encounter: Payer: Self-pay | Admitting: *Deleted

## 2019-05-17 ENCOUNTER — Telehealth: Payer: Self-pay | Admitting: *Deleted

## 2019-05-17 NOTE — Telephone Encounter (Signed)
Called patient Kayla [name] for patient to return call to clinic.  Patient did not call [name] to reschedule her appointment.  Will send letter to paitent.

## 2019-05-18 ENCOUNTER — Telehealth: Payer: Self-pay

## 2019-05-18 NOTE — Telephone Encounter (Signed)
Patient called and left a message on the triage line and stated that she is now "clear" and is able to think straight. For this reason she is now ready to make any needed appointments and to start physical therapy. She stated to call her as soon as possible to get her on the schedule for needed appointments.

## 2019-05-18 NOTE — Telephone Encounter (Signed)
Discussed options for insulin with patient. She would like to get the $4 lantus pen from the outpatient pharmacy and expressed this would be much easier. She confirmed she can afford and will have the means in place to pick it up. She is planning to scan and e-mail 3 income documents for the [facility] MedAssist application, as she received the most recent one today.

## 2019-05-20 ENCOUNTER — Telehealth: Payer: Self-pay | Admitting: Licensed Clinical Social Worker

## 2019-05-20 MED ORDER — LANTUS SOLOSTAR 100 UNIT/ML ~~LOC~~ SOPN: 20.0000 [IU] | PEN_INJECTOR | Freq: Every day | SUBCUTANEOUS | 3 refills | Status: DC

## 2019-05-20 MED FILL — LANTUS SOLOSTAR 100 UNITS/M: 100 | 30 days supply | Qty: 6 | Fill #0

## 2019-05-20 NOTE — Addendum Note (Signed)
Addended by: Forde Dandy on: 05/20/2019 11:11 AM   Modules accepted: Orders

## 2019-05-20 NOTE — Addendum Note (Signed)
Addended by: Hulan Fray on: 05/20/2019 09:32 AM   Modules accepted: Orders

## 2019-05-20 NOTE — Telephone Encounter (Signed)
Patient contacted our office back, and reported that she was not interested in counseling services at this time. Patient is aware that she can request services at anytime if she changes her mind.

## 2019-05-20 NOTE — Telephone Encounter (Signed)
Patient was called (3rd attempt) to establish care. Patient did not answer, and a vm was left for the patient to contact our office to set up a future appointment.

## 2019-05-24 ENCOUNTER — Encounter: Payer: No Typology Code available for payment source | Admitting: Psychology

## 2019-05-27 ENCOUNTER — Telehealth: Payer: Self-pay

## 2019-05-27 DIAGNOSIS — IMO0002 Reserved for concepts with insufficient information to code with codable children: Secondary | ICD-10-CM

## 2019-05-27 DIAGNOSIS — E1140 Type 2 diabetes mellitus with diabetic neuropathy, unspecified: Secondary | ICD-10-CM

## 2019-05-27 DIAGNOSIS — I10 Essential (primary) hypertension: Secondary | ICD-10-CM

## 2019-05-27 DIAGNOSIS — E890 Postprocedural hypothyroidism: Secondary | ICD-10-CM

## 2019-05-27 NOTE — Telephone Encounter (Signed)
Patient has been approved for [facility] through August 2021. 

## 2019-05-28 MED ORDER — BASAGLAR KWIKPEN 100 UNIT/ML ~~LOC~~ SOPN: 20.0000 [IU] | PEN_INJECTOR | Freq: Every day | SUBCUTANEOUS | 3 refills | Status: AC

## 2019-05-28 MED ORDER — FUROSEMIDE 20 MG PO TABS: 20.0000 mg | ORAL_TABLET | Freq: Every day | ORAL | 0 refills | Status: DC

## 2019-05-28 MED ORDER — AMLODIPINE BESYLATE 2.5 MG PO TABS: 2.5000 mg | ORAL_TABLET | Freq: Every day | ORAL | 0 refills | Status: DC

## 2019-05-28 MED ORDER — LEVOTHYROXINE SODIUM 150 MCG PO TABS: 150.0000 ug | ORAL_TABLET | Freq: Every day | ORAL | 3 refills | Status: DC

## 2019-05-28 NOTE — Telephone Encounter (Signed)
Prescriptions sent to [facility] pharmacy and patient notified.

## 2019-06-04 ENCOUNTER — Ambulatory Visit: Payer: Self-pay | Admitting: Pharmacist

## 2019-06-10 ENCOUNTER — Other Ambulatory Visit: Payer: Self-pay

## 2019-06-10 ENCOUNTER — Ambulatory Visit: Payer: Self-pay | Admitting: Pharmacist

## 2019-06-10 ENCOUNTER — Telehealth: Payer: Self-pay | Admitting: *Deleted

## 2019-06-10 NOTE — Telephone Encounter (Signed)
Called pt to reschedule her appt for CGM with [facility]- no answer; left message to call the office.

## 2019-06-10 NOTE — Telephone Encounter (Signed)
Return call - explained we can re-schedule her appt which will be with Christene Slates instead of Worthy Flank and provide transportation via [employer]. She's agreeable - stated any day except Mondays and afternoon appt.

## 2019-06-11 NOTE — Telephone Encounter (Signed)
Appt scheduled Wed 9/16 @ 1415PM. Pt was called yesterday by Kayla [name] about her appt and transportation arrangement.

## 2019-06-16 ENCOUNTER — Encounter: Payer: Self-pay | Admitting: Dietician

## 2019-06-16 ENCOUNTER — Ambulatory Visit (INDEPENDENT_AMBULATORY_CARE_PROVIDER_SITE_OTHER): Payer: Self-pay | Admitting: Dietician

## 2019-06-16 DIAGNOSIS — E118 Type 2 diabetes mellitus with unspecified complications: Secondary | ICD-10-CM

## 2019-06-16 DIAGNOSIS — IMO0002 Reserved for concepts with insufficient information to code with codable children: Secondary | ICD-10-CM

## 2019-06-16 DIAGNOSIS — E1140 Type 2 diabetes mellitus with diabetic neuropathy, unspecified: Secondary | ICD-10-CM

## 2019-06-16 NOTE — Progress Notes (Signed)
Documentation for Freestyle Libre Pro Continuous glucose monitoring Freestyle Libre Pro CGM sensor placed today. Patient was educated about wearing sensor, keeping food, activity and medication log and when to call office. Patient was educated about how to care for the sensor and not to have an MRI, CT or Diathermy while wearing the sensor. Follow up was arranged with the patient for 1 week.   Lot #: [id number] Serial #: [id number] Expiration Date: 08/30/2019  Kayla [name] also wanted help with scheduling an eye exam. Note that she is still working on getting the [facility] card so a referral can be made.   Kayla [name], RD 06/16/2019 2:12 PM.

## 2019-06-16 NOTE — Patient Instructions (Signed)
Please record the time, amount and what food drinks and activities you have while wearing the continuous glucose monitor (CGM).  Bring the folder with you to follow up appointments. If your monitor falls off, please place it in the bag provided in your folder and bring it back with you to your next appointment.   Do not have a CT or an MRI while wearing the CGM.    On your way out- Please stop at the front desk to schedule 1 week visit with me and a doctor for the first of two CGM downloads.   You will also return in 2 weeks to have your second download and the CGM removed.  Debera Lat, RD 06/16/2019 2:12 PM

## 2019-06-22 NOTE — Progress Notes (Signed)
Can consider switching insulin to a GLP-1 agonist or SGLT-2 inhibitor due to CKD. Please consult Mannie Stabile for help if needed.

## 2019-06-23 ENCOUNTER — Ambulatory Visit (INDEPENDENT_AMBULATORY_CARE_PROVIDER_SITE_OTHER): Payer: Self-pay | Admitting: Internal Medicine

## 2019-06-23 ENCOUNTER — Encounter: Payer: Self-pay | Admitting: Internal Medicine

## 2019-06-23 ENCOUNTER — Encounter: Payer: Self-pay | Admitting: Dietician

## 2019-06-23 ENCOUNTER — Other Ambulatory Visit: Payer: Self-pay

## 2019-06-23 ENCOUNTER — Ambulatory Visit: Payer: Self-pay | Admitting: Dietician

## 2019-06-23 VITALS — BP 183/71 | HR 70 | Temp 98.4°F | Ht 62.0 in | Wt 132.8 lb

## 2019-06-23 DIAGNOSIS — IMO0002 Reserved for concepts with insufficient information to code with codable children: Secondary | ICD-10-CM

## 2019-06-23 DIAGNOSIS — Z79899 Other long term (current) drug therapy: Secondary | ICD-10-CM

## 2019-06-23 DIAGNOSIS — Z87891 Personal history of nicotine dependence: Secondary | ICD-10-CM

## 2019-06-23 DIAGNOSIS — E1140 Type 2 diabetes mellitus with diabetic neuropathy, unspecified: Secondary | ICD-10-CM

## 2019-06-23 DIAGNOSIS — Z794 Long term (current) use of insulin: Secondary | ICD-10-CM

## 2019-06-23 DIAGNOSIS — I10 Essential (primary) hypertension: Secondary | ICD-10-CM

## 2019-06-23 NOTE — Progress Notes (Signed)
Documentation for CGM download #1: Start [phone] End time: 1500 Covisit with Dr. Shan Levans. See his note for more details.  [name] reports that she has been sick often with  nausea/vomting since her last visit and did not tak eher insulin on may days. She does not take insulin if sugar is low and she is not eating.  The days she remembers taking  it was  Marked on her download report. On those days, she took at most 3-5 units of insulin  She reports the medicine that does not make her sick is  Topramax, insulin, synthroid and  Gabapentin.  She did not bring her contour next meter today. She reports she has been checking ~ 1x/day with results between 70-200. I requested she bring her meter to all visits and continue chekcing as it may be the best way to monitor her blood glucose with anemia and CKD.   CGM Results from Download #1  Average is  95  for 8 days Glucose management indicator 5.6 % Time in range (70-180 mg/dL): 45 % (Goal >70%) Time High (181-250 mg/dL) 9 % (Goal < 25%) Time Very High (>250 mg/dl) 0 % (Goal < 5%) Time Low (54-69 mg/dL): 23 % (Goal is <4%) Time Very Low (<54) 23%  (Goal <1%)  Coefficient of variation:54.3% (Goal is <36%) Plan: follow up in 1 week for Continuous glucose monitoring download 2.  Kayla [name], RD 06/23/2019 3:21 PM.

## 2019-06-23 NOTE — Progress Notes (Signed)
CC: HTN, T2DM  HPI:  Kayla [name] is a 58 y.o. female with PMH below.  Today we will address HTN, T2DM  Please see A&P for status of the patient's chronic medical conditions  Past Medical History:  Diagnosis Date  . ANA POSITIVE 02/09/2010  . Anal warts 11/09/2012  . Anxiety   . Arthritis    "my bones ache all the time" (06/27/2014)  . Cellulitis 06/27/2014   medial proximal left thigh extending to the perineium  . Chronic bronchitis ([place])    "get it changing of the seasons; sometimes q yr; sometimes not"   . Complication of anesthesia    sats drop post op  . COPD (chronic obstructive pulmonary disease) ([place])   . 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 Diabetic peripheral neuropathy ([place]) since <2005   "hands and feet"  . Dizziness    Multiple falls at home  . Fibromyalgia   . Graves' disease    "they nuked it twice"  . [id number]    "monthly" (06/27/2014)  . Heart murmur    as a child-never had echo to evaluate  . Hyperlipidemia   . Hypertension   . Hypothyroidism   . Sickle cell trait ([place])   . Trimalleolar fracture of left ankle 11/21/2014  . Type 2 diabetes mellitus ([place])   . Wears glasses    Review of Systems:  ROS: Pulmonary: pt denies increased work of breathing, shortness of breath,  Cardiac: pt denies palpitations, chest pain,  Abdominal: pt denies abdominal pain, nausea, vomiting, or diarrhea  Physical Exam:  Vitals:   06/23/19 1511 06/23/19 1512  BP:  (!) 196/65  Pulse:  72  Temp:  98.4 F (36.9 C)  TempSrc:  Oral  SpO2:  100%  Weight: 132 lb 12.8 oz (60.2 kg)   Height: 5\' 2"  (1.575 m)    Cardiac: normal rate and rhythm, clear s1 and s2, no murmurs, rubs or gallops, no LE edema Pulmonary: CTAB, not in distress Psych: Alert, conversant   Social History   Socioeconomic History  . Marital status: Single    Spouse name: Not on file  . Number of children: Not on file  . Years of education: 63  . Highest education  level: Not on file  Occupational History    Employer: UNEMPLOYED  Social Needs  . Financial resource strain: Not on file  . Food insecurity    Worry: Not on file    Inability: Not on file  . Transportation needs    Medical: Not on file    Non-medical: Not on file  Tobacco Use  . Smoking status: Former Smoker    Packs/day: 0.75    Years: 5.00    Pack years: 3.75    Types: Cigarettes    Quit date: 09/30/1990    Years since quitting: 28.7  . Smokeless tobacco: Never Used  Substance and Sexual Activity  . Alcohol use: No    Alcohol/week: 0.0 standard drinks  . Drug use: No  . Sexual activity: Not Currently  Lifestyle  . Physical activity    Days per week: Not on file    Minutes per session: Not on file  . Stress: Not on file  Relationships  . Social Herbalist on phone: Not on file    Gets together: Not on file    Attends religious service: Not on file    Active member of club or organization: Not on  file    Attends meetings of clubs or organizations: Not on file    Relationship status: Not on file  . Intimate partner violence    Fear of current or ex partner: Not on file    Emotionally abused: Not on file    Physically abused: Not on file    Forced sexual activity: Not on file  Other Topics Concern  . Not on file  Social History Narrative   Lives in [place], alone. Looking for a job. Has a son in town who checks in on her.     Family History  Problem Relation Age of Onset  . Emphysema Mother   . Cancer Mother        unknown  . Hypertension Mother   . Cancer Father        pancreatic   . Diabetes Father   . Hypertension Father   . Down syndrome Brother   . Cancer Maternal Aunt        cancer  . Cancer Other        breast cancer aunt  . Stroke Sister   . Diabetes Sister   . Colon cancer Neg Hx   . Stomach cancer Neg Hx     Assessment & Plan:   See Encounters Tab for problem based charting.  Patient discussed with Dr. Evette Doffing

## 2019-06-23 NOTE — Patient Instructions (Addendum)
Kayla [name] continue with your current medications and continue your current blood glucose management.  We will see you in about one week.  We will check your lab work next time.

## 2019-06-23 NOTE — Patient Instructions (Signed)
Hi Kayla [name],  Than you for you visit today and taking such good care of the sensor!  Please bring your meter next week.   Hope you fell better soon!  Please do not hesitate to call me if I can help!   Butch Penny [phone]

## 2019-06-24 NOTE — Assessment & Plan Note (Signed)
Ate yesterday without issues.  Before that stopped all meds due to feeling nauseous and sick. She is convinced her medications made her sick.  Says she is fasting today for labs and also did not take her medications today.  Has nephrology appointment in two weeks.  Educated her that it is unlikely her medications are causing her to feel sick.   -she will restart home amlodipine and lasix (recently increased by nephrology)

## 2019-06-24 NOTE — Assessment & Plan Note (Addendum)
Hasn't taken any insulin recently with her cbg running low from not eating.  If she's hungry will take 10 units but prefers her prescription stays for 20 so she has extra.  She says she adjusts the dose further based on what and how much she eats and that she has it under control.  CGM today shows average glucose of 95.  Discussed switching to mealtime insulin or a slightly shorter acting basal insulin like levemir however she declined.  -no changes today as she would like to continue her current self titration -she has a follow up in one week for another download

## 2019-06-24 NOTE — Progress Notes (Signed)
Internal Medicine Clinic Attending  Case discussed with Dr. Winfrey  at the time of the visit.  We reviewed the resident's history and exam and pertinent patient test results.  I agree with the assessment, diagnosis, and plan of care documented in the resident's note.  

## 2019-06-30 ENCOUNTER — Ambulatory Visit: Payer: Self-pay | Admitting: Dietician

## 2019-06-30 ENCOUNTER — Other Ambulatory Visit: Payer: Self-pay

## 2019-06-30 ENCOUNTER — Encounter: Payer: Self-pay | Admitting: Internal Medicine

## 2019-06-30 ENCOUNTER — Ambulatory Visit (INDEPENDENT_AMBULATORY_CARE_PROVIDER_SITE_OTHER): Payer: Self-pay | Admitting: Internal Medicine

## 2019-06-30 ENCOUNTER — Encounter: Payer: Self-pay | Admitting: Dietician

## 2019-06-30 VITALS — BP 154/58 | HR 74 | Temp 98.6°F | Ht 62.0 in | Wt 137.6 lb

## 2019-06-30 DIAGNOSIS — Z79890 Hormone replacement therapy (postmenopausal): Secondary | ICD-10-CM

## 2019-06-30 DIAGNOSIS — Z794 Long term (current) use of insulin: Secondary | ICD-10-CM

## 2019-06-30 DIAGNOSIS — E1165 Type 2 diabetes mellitus with hyperglycemia: Secondary | ICD-10-CM

## 2019-06-30 DIAGNOSIS — IMO0002 Reserved for concepts with insufficient information to code with codable children: Secondary | ICD-10-CM

## 2019-06-30 DIAGNOSIS — E1140 Type 2 diabetes mellitus with diabetic neuropathy, unspecified: Secondary | ICD-10-CM

## 2019-06-30 DIAGNOSIS — Z79899 Other long term (current) drug therapy: Secondary | ICD-10-CM

## 2019-06-30 DIAGNOSIS — Z87891 Personal history of nicotine dependence: Secondary | ICD-10-CM

## 2019-06-30 DIAGNOSIS — E890 Postprocedural hypothyroidism: Secondary | ICD-10-CM

## 2019-06-30 DIAGNOSIS — D500 Iron deficiency anemia secondary to blood loss (chronic): Secondary | ICD-10-CM

## 2019-06-30 MED ORDER — LANSOPRAZOLE 30 MG PO CPDR: 30.0000 mg | DELAYED_RELEASE_CAPSULE | Freq: Every day | ORAL | 1 refills | Status: DC

## 2019-06-30 NOTE — Progress Notes (Signed)
CC: T2DM, Hypothyroidism, Chronic blood loss anemia  HPI:  Kayla [name] is a 58 y.o. female with PMH below.  Today we will address T2DM, Hypothyroidism, Chronic blood loss anemia  Please see A&P for status of the patient's chronic medical conditions  Past Medical History:  Diagnosis Date  . ANA POSITIVE 02/09/2010  . Anal warts 11/09/2012  . Anxiety   . Arthritis    "my bones ache all the time" (06/27/2014)  . Cellulitis 06/27/2014   medial proximal left thigh extending to the perineium  . Chronic bronchitis ([place])    "get it changing of the seasons; sometimes q yr; sometimes not"   . Complication of anesthesia    sats drop post op  . COPD (chronic obstructive pulmonary disease) ([place])   . 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 Diabetic peripheral neuropathy ([place]) since <2005   "hands and feet"  . Dizziness    Multiple falls at home  . Fibromyalgia   . Graves' disease    "they nuked it twice"  . [id number]    "monthly" (06/27/2014)  . Heart murmur    as a child-never had echo to evaluate  . Hyperlipidemia   . Hypertension   . Hypothyroidism   . Sickle cell trait ([place])   . Trimalleolar fracture of left ankle 11/21/2014  . Type 2 diabetes mellitus ([place])   . Wears glasses    Review of Systems:  ROS: Pulmonary: pt denies increased work of breathing, shortness of breath,  Cardiac: pt denies palpitations, chest pain,  Abdominal: pt denies abdominal pain, nausea, vomiting, or diarrhea   Physical Exam:  Vitals:   06/30/19 1405 06/30/19 1410  BP:  (!) 154/58  Pulse:  74  Temp:  98.6 F (37 C)  TempSrc:  Oral  SpO2:  100%  Weight: 137 lb 9.6 oz (62.4 kg)   Height: 5\' 2"  (1.575 m)    Cardiac:  normal rate and rhythm, clear s1 and s2, no murmurs, rubs or gallops Pulmonary: CTAB, not in distress Psych: Alert, conversant, in good spirits   Social History   Socioeconomic History  . Marital status: Single    Spouse name: Not on file  .  Number of children: Not on file  . Years of education: 64  . Highest education level: Not on file  Occupational History    Employer: UNEMPLOYED  Social Needs  . Financial resource strain: Not on file  . Food insecurity    Worry: Not on file    Inability: Not on file  . Transportation needs    Medical: Not on file    Non-medical: Not on file  Tobacco Use  . Smoking status: Former Smoker    Packs/day: 0.75    Years: 5.00    Pack years: 3.75    Types: Cigarettes    Quit date: 09/30/1990    Years since quitting: 28.7  . Smokeless tobacco: Never Used  Substance and Sexual Activity  . Alcohol use: No    Alcohol/week: 0.0 standard drinks  . Drug use: No  . Sexual activity: Not Currently  Lifestyle  . Physical activity    Days per week: Not on file    Minutes per session: Not on file  . Stress: Not on file  Relationships  . Social Herbalist on phone: Not on file    Gets together: Not on file    Attends religious service: Not on file  Active member of club or organization: Not on file    Attends meetings of clubs or organizations: Not on file    Relationship status: Not on file  . Intimate partner violence    Fear of current or ex partner: Not on file    Emotionally abused: Not on file    Physically abused: Not on file    Forced sexual activity: Not on file  Other Topics Concern  . Not on file  Social History Narrative   Lives in [place], alone. Looking for a job. Has a son in town who checks in on her.     Family History  Problem Relation Age of Onset  . Emphysema Mother   . Cancer Mother        unknown  . Hypertension Mother   . Cancer Father        pancreatic   . Diabetes Father   . Hypertension Father   . Down syndrome Brother   . Cancer Maternal Aunt        cancer  . Cancer Other        breast cancer aunt  . Stroke Sister   . Diabetes Sister   . Colon cancer Neg Hx   . Stomach cancer Neg Hx     Assessment & Plan:   See Encounters Tab  for problem based charting.  Patient discussed with Dr. Lynnae January

## 2019-06-30 NOTE — Assessment & Plan Note (Addendum)
Pt due for TSH check since restarting synthroid.  Reports good compliance with 191mcg daily, she brought it with her today.    -check TSH, adjust synthroid as needed

## 2019-06-30 NOTE — Patient Instructions (Signed)
Kayla [name] we will check your blood count, renal function and thyroid function today.  I will call you with the results of your lab testing and direct you if any changes need to be made.

## 2019-06-30 NOTE — Assessment & Plan Note (Addendum)
Further cgm download today, reviewed in detail and went over with the patient. Only one day with confirmed hypoglycemia from a day of poor oral intake after taking lantus, which patient self-treated.  She would still like to stay with the long acting insulin unfortunately.  With low GFR not really a good candidate for other oral or injectable therapies.  -continue current regimen, pt self titrating insulin dosage based on oral intake at this point

## 2019-06-30 NOTE — Assessment & Plan Note (Addendum)
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  Unfortunately has been difficult to get any IV iron or epo due to having no insurance.  She has a slow upper GI bleed likely resulting from H. Pylori induced ulceration.  She is debating whether to continue follow up with gastroenterology I encouraged her to do so as we do not have testing to confirm eradication in our clinic.    -continue ppi therapy will go ahead and refill prescription  -check cbc today, transfuse as necessary -sent a message to our financial administrator to look into orange card eligibility and schedule an appt if feasible.

## 2019-07-01 LAB — TSH: TSH: 153 u[IU]/mL — ABNORMAL HIGH (ref 0.450–4.500)

## 2019-07-01 LAB — RENAL FUNCTION PANEL
Albumin: 3.4 g/dL — ABNORMAL LOW (ref 3.8–4.9)
BUN/Creatinine Ratio: 11 (ref 9–23)
BUN: 39 mg/dL — ABNORMAL HIGH (ref 6–24)
CO2: 13 mmol/L — ABNORMAL LOW (ref 20–29)
Calcium: 9.3 mg/dL (ref 8.7–10.2)
Chloride: 112 mmol/L — ABNORMAL HIGH (ref 96–106)
Creatinine, Ser: 3.56 mg/dL — ABNORMAL HIGH (ref 0.57–1.00)
GFR calc Af Amer: 16 mL/min/{1.73_m2} — ABNORMAL LOW (ref >59)
GFR calc non Af Amer: 14 mL/min/{1.73_m2} — ABNORMAL LOW (ref >59)
Glucose: 150 mg/dL — ABNORMAL HIGH (ref 65–99)
Phosphorus: 4.9 mg/dL — ABNORMAL HIGH (ref 3.0–4.3)
Potassium: 5.5 mmol/L — ABNORMAL HIGH (ref 3.5–5.2)
Sodium: 137 mmol/L (ref 134–144)

## 2019-07-01 LAB — CBC
Hematocrit: 26.4 % — ABNORMAL LOW (ref 34.0–46.6)
Hemoglobin: 8.8 g/dL — ABNORMAL LOW (ref 11.1–15.9)
MCH: 31.4 pg (ref 26.6–33.0)
MCHC: 33.3 g/dL (ref 31.5–35.7)
MCV: 94 fL (ref 79–97)
Platelets: 235 10*3/uL (ref 150–450)
RBC: 2.8 x10E6/uL — ABNORMAL LOW (ref 3.77–5.28)
RDW: 16.1 % — ABNORMAL HIGH (ref 11.7–15.4)
WBC: 11.1 10*3/uL — ABNORMAL HIGH (ref 3.4–10.8)

## 2019-07-01 NOTE — Progress Notes (Signed)
Met briefly with Ms medaglia to assist with her Continuous glucose monitor download and sensor removal.  Answered her wuestions about diabetes, kidney disease and diet.  Plan for follow up as needed Debera Lat, RD 07/01/2019 1:37 PM.

## 2019-07-02 ENCOUNTER — Ambulatory Visit (INDEPENDENT_AMBULATORY_CARE_PROVIDER_SITE_OTHER): Payer: Self-pay | Admitting: Gastroenterology

## 2019-07-02 ENCOUNTER — Encounter: Payer: Self-pay | Admitting: Gastroenterology

## 2019-07-02 ENCOUNTER — Telehealth: Payer: Self-pay | Admitting: Internal Medicine

## 2019-07-02 ENCOUNTER — Telehealth: Payer: Self-pay | Admitting: *Deleted

## 2019-07-02 VITALS — BP 150/82 | HR 75 | Temp 98.4°F | Ht 62.0 in | Wt 142.0 lb

## 2019-07-02 DIAGNOSIS — E875 Hyperkalemia: Secondary | ICD-10-CM

## 2019-07-02 DIAGNOSIS — K279 Peptic ulcer, site unspecified, unspecified as acute or chronic, without hemorrhage or perforation: Secondary | ICD-10-CM

## 2019-07-02 DIAGNOSIS — Z8619 Personal history of other infectious and parasitic diseases: Secondary | ICD-10-CM

## 2019-07-02 DIAGNOSIS — E890 Postprocedural hypothyroidism: Secondary | ICD-10-CM

## 2019-07-02 DIAGNOSIS — K209 Esophagitis, unspecified without bleeding: Secondary | ICD-10-CM

## 2019-07-02 DIAGNOSIS — D509 Iron deficiency anemia, unspecified: Secondary | ICD-10-CM

## 2019-07-02 MED ORDER — FERROUS GLUCONATE 324 (38 FE) MG PO TABS: 324.0000 mg | ORAL_TABLET | Freq: Every day | ORAL | 3 refills | Status: DC

## 2019-07-02 NOTE — Telephone Encounter (Addendum)
Pt called back to the clinic asking for a return call.  Spoke with pt about results of tsh, rfp and cbc.  She is not taking her synthroid consistently, says it makes her feel poorly.  She is also not taking her lasix says she pees on her own doesn't need it.  Counseled her on reasons for the medications on their importance.  She will continue taking the synthroid intermittently but will give the lasix another go.  She is agreeable to further counseling.

## 2019-07-02 NOTE — Telephone Encounter (Signed)
Pt rtcto dr Shan Levans, have text him to call her back

## 2019-07-02 NOTE — Patient Instructions (Addendum)
Your provider has requested that you go to the basement level for lab work in 2 weeks for H.pylori stool study and 6 weeks for remaining labs. Press "B" on the elevator. The lab is located at the first door on the left as you exit the elevator.   Be sure to stop your omeprazole for 2 weeks before submitting your stool studies.  We have sent the following medications to your pharmacy for you to pick up at your convenience: Iron    If you are age 57 or younger, your body mass index should be between 19-25. Your Body mass index is 25.97 kg/m. If this is out of the aformentioned range listed, please consider follow up with your Primary Care Provider.   Thank you for choosing me and [facility].  Dr. Rush Landmark

## 2019-07-02 NOTE — Telephone Encounter (Signed)
Called pt x2 went to voicemail.  Left message outlining results of cbc rfp and tsh.  Asked that pt return for medication education and dietary education.

## 2019-07-02 NOTE — Telephone Encounter (Signed)
I called her again was able to speak with her will update my phone note

## 2019-07-03 ENCOUNTER — Encounter: Payer: Self-pay | Admitting: Gastroenterology

## 2019-07-03 DIAGNOSIS — Z8619 Personal history of other infectious and parasitic diseases: Secondary | ICD-10-CM | POA: Insufficient documentation

## 2019-07-03 DIAGNOSIS — D509 Iron deficiency anemia, unspecified: Secondary | ICD-10-CM | POA: Insufficient documentation

## 2019-07-03 DIAGNOSIS — K209 Esophagitis, unspecified without bleeding: Secondary | ICD-10-CM | POA: Insufficient documentation

## 2019-07-03 DIAGNOSIS — K279 Peptic ulcer, site unspecified, unspecified as acute or chronic, without hemorrhage or perforation: Secondary | ICD-10-CM

## 2019-07-03 HISTORY — DX: Personal history of other infectious and parasitic diseases: Z86.19

## 2019-07-03 HISTORY — DX: Peptic ulcer, site unspecified, unspecified as acute or chronic, without hemorrhage or perforation: K27.9

## 2019-07-03 NOTE — Progress Notes (Signed)
[place] VISIT   Primary Care Provider Marianna Payment, MD [place] [address] [phone]  Referring Provider No referring provider defined for this encounter.   Patient Profile: Kayla [name] is a 58 y.o. female with a pmh significant for Graves' disease, COPD, diabetes, chronic renal insufficiency stage V, hypertension, sickle cell trait, peptic ulcer disease, esophagitis, H. pylori infection, uninsured.  The patient presents to the [facility] for an evaluation and management of problem(s) noted below:  Problem List 1. Hx of Helicobacter infection   2. Iron deficiency anemia, unspecified iron deficiency anemia type   3. PUD (peptic ulcer disease)   4. Acute esophagitis     History of Present Illness This is a patient that was previously followed by Dr. Carlean Purl whom she is transferring care to me whom I met in the hospital in the setting of anemia with dark stools.  She underwent an upper endoscopy with findings of esophagitis grade C as well as peptic ulcer disease and duodenal ulceration and findings of H. pylori infection.  She was initiated on PPI therapy and started on treatment.  The patient had evidence of iron deficiency anemia and was offered a colonoscopy but due to her issues of longstanding constipation (only going to the bathroom every 3 days) she did not feel that a colonoscopy was in her best interest at the time during her hospitalization and deferred on this being done at the same time as her endoscopy.  Her last colonoscopy was in 2014 showing anal warts but otherwise normal colon after significant lavage to get her to an adequate prep.  The patient did well post discharge however had to go to the hospital and get transfused over a month ago due to a low hemoglobin of unclear etiology.  She has followed up with renal and has not had a follow-up plans until November of this year.  The patient  followed up with the resident clinic as she is uninsured and has follow-up scheduled in 4 to 6 weeks.  The patient notes no other issues and is very hesitant about any further endoscopic evaluation due to her uninsured status.  When discussing issues of taking iron she feels very concerned about this because it has caused her issues in the past with causing her to get pain because she gets more constipated.  GI Review of Systems Positive as above Negative for pyrosis, dysphagia, nausea, vomiting, change in bowel habits, melena, hematochezia  Review of Systems General: Denies fevers/chills/weight loss HEENT: Denies oral lesions Cardiovascular: Denies chest pain Pulmonary: Denies shortness of breath/nocturnal cough Gastroenterological: See HPI Genitourinary: Denies darkened urine Hematological: Denies easy bruising/bleeding Endocrine: Positive for temperature intolerance Dermatological: Denies jaundice Psychological: Mood is stable   Medications Current Outpatient Medications  Medication Sig Dispense Refill   amLODipine (NORVASC) 2.5 MG tablet Take 1 tablet (2.5 mg total) by mouth daily. 90 tablet 0   Blood Glucose Monitoring Suppl (ACCU-CHEK AVIVA PLUS) W/DEVICE KIT 1 kit by Does not apply route as directed. 1 kit 0   furosemide (LASIX) 20 MG tablet Take 1 tablet (20 mg total) by mouth daily. 90 tablet 0   gabapentin (NEURONTIN) 600 MG tablet Take 600 mg by mouth 3 (three) times daily as needed (pain).     glucose blood (ACCU-CHEK AVIVA PLUS) test strip Use as instructed 100 each 12   Insulin Glargine (BASAGLAR KWIKPEN) 100 UNIT/ML SOPN Inject 0.2 mLs (20 Units total) into the skin daily. 6  pen 3   ketoconazole (NIZORAL) 2 % shampoo Apply 1 application topically 2 (two) times a week. (Patient taking differently: Apply 1 application topically 2 (two) times a week. On skin) 120 mL 0   Lancet Devices (ACCU-CHEK SOFTCLIX) lancets Use as instructed 1 each 0   lansoprazole (PREVACID)  30 MG capsule Take 1 capsule (30 mg total) by mouth daily. 90 capsule 1   levothyroxine (SYNTHROID) 150 MCG tablet Take 1 tablet (150 mcg total) by mouth at bedtime. (Patient taking differently: Take 150 mcg by mouth at bedtime. Has to be brand name) 90 tablet 3   lidocaine (LIDODERM) 5 % Place 1 patch onto the skin daily. Remove & Discard patch within 12 hours or as directed by MD 15 patch 0   topiramate (TOPAMAX) 25 MG capsule Take 25-50 mg by mouth See admin instructions. Take 25 mg  in the morning, 50 mg at night     Vitamin D, Ergocalciferol, (DRISDOL) 50000 units CAPS capsule Take 1 capsule (50,000 Units total) by mouth every 7 (seven) days. 12 capsule 0   ferrous gluconate (FERGON) 324 MG tablet Take 1 tablet (324 mg total) by mouth daily with breakfast. 30 tablet 3   No current facility-administered medications for this visit.     Allergies Allergies  Allergen Reactions   Ace Inhibitors     Dizziness, nausea   Percocet [Oxycodone-Acetaminophen] Itching   Statins    Latex Rash   Sulfamethoxazole Rash   Sulfites Rash    Histories Past Medical History:  Diagnosis Date   ANA POSITIVE 02/09/2010   Anal warts 11/09/2012   Anxiety    Arthritis    "my bones ache all the time" (06/27/2014)   Cellulitis 06/27/2014   medial proximal left thigh extending to the perineium   Chronic bronchitis ([place])    "get it changing of the seasons; sometimes q yr; sometimes not"    Complication of anesthesia    sats drop post op   COPD (chronic obstructive pulmonary disease) ([place])    Deafness in 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"   Diabetic peripheral neuropathy ([place]) since <2005   "hands and feet"   Dizziness    Multiple falls at home   Fibromyalgia    Graves' disease    "they nuked it twice"   Headache(784.0)    "monthly" (06/27/2014)   Heart murmur    as a child-never had echo to evaluate   Hyperlipidemia    Hypertension    Hypothyroidism     Sickle cell trait ([place])    Trimalleolar fracture of left ankle 11/21/2014   Type 2 diabetes mellitus ([place])    Wears glasses    Past Surgical History:  Procedure Laterality Date   ANKLE FRACTURE SURGERY Left 2008   "didn't put hardware in"   [place]  04/28/2019   Procedure: BIOPSY;  Surgeon: Jerene Bears, MD;  Location: [facility];  Service: Gastroenterology;;   [place]  1990's   ESOPHAGOGASTRODUODENOSCOPY (EGD) WITH PROPOFOL N/A 04/28/2019   Procedure: ESOPHAGOGASTRODUODENOSCOPY (EGD) WITH PROPOFOL;  Surgeon: Jerene Bears, MD;  Location: [facility];  Service: Gastroenterology;  Laterality: N/A;   FRACTURE SURGERY     INCISION AND DRAINAGE ABSCESS Left 07/01/2014   Procedure: INCISION AND DRAINAGE ABSCESS OF LEFT THIGH;  Surgeon: Georganna Skeans, MD;  Location: [facility];  Service: General;  Laterality: Left;   ORIF  ANKLE FRACTURE Left 11/21/2014   Procedure: OPEN REDUCTION INTERNAL FIXATION (ORIF) TRI MALLEOLAR FRACTURE with retinacular repair;  Surgeon: Johnny Bridge, MD;  Location: [facility];  Service: Orthopedics;  Laterality: Left;   Social History   Socioeconomic History   Marital status: Single    Spouse name: Not on file   Number of children: 2   Years of education: 12   Highest education level: Not on file  Occupational History    Employer: UNEMPLOYED  Social Designer, fashion/clothing strain: Not on file   Food insecurity    Worry: Not on file    Inability: Not on file   Transportation needs    Medical: Not on file    Non-medical: Not on file  Tobacco Use   Smoking status: Former Smoker    Packs/day: 0.75    Years: 5.00    Pack years: 3.75    Types: Cigarettes    Quit date: 09/30/1990    Years since quitting: 28.7   Smokeless tobacco: Never Used  Substance and Sexual Activity   Alcohol use: No    Alcohol/week: 0.0 standard drinks   Drug use: No     Sexual activity: Not Currently  Lifestyle   Physical activity    Days per week: Not on file    Minutes per session: Not on file   Stress: Not on file  Relationships   Social connections    Talks on phone: Not on file    Gets together: Not on file    Attends religious service: Not on file    Active member of club or organization: Not on file    Attends meetings of clubs or organizations: Not on file    Relationship status: Not on file   Intimate partner violence    Fear of current or ex partner: Not on file    Emotionally abused: Not on file    Physically abused: Not on file    Forced sexual activity: Not on file  Other Topics Concern   Not on file  Social History Narrative   Lives in [place], alone. Looking for a job. Has a son in town who checks in on her.    Family History  Problem Relation Age of Onset   Emphysema Mother    Cancer Mother        unknown   Hypertension Mother    Cancer Father        pancreatic    Diabetes Father    Hypertension Father    Down syndrome Brother    Cancer Maternal Aunt        cancer   Cancer Other        breast cancer aunt   Stroke Sister    Diabetes Sister    Colon cancer Neg Hx    Stomach cancer Neg Hx    Esophageal cancer Neg Hx    Inflammatory bowel disease Neg Hx    Liver disease Neg Hx    Pancreatic cancer Neg Hx    Rectal cancer Neg Hx    I have reviewed her medical, social, and family history in detail and updated the electronic medical record as necessary.    PHYSICAL EXAMINATION  BP (!) 150/82    Pulse 75    Temp 98.4 F (36.9 C)    Ht '5\' 2"'  (1.575 m)    Wt 142 lb (64.4 kg)    LMP 01/12/2014    BMI 25.97 kg/m  Wt Readings from Last 3 Encounters:  07/02/19 142 lb (64.4 kg)  06/30/19 137 lb 9.6 oz (62.4 kg)  06/23/19 132 lb 12.8 oz (60.2 kg)  GEN: NAD, appears stated age, doesn't appear chronically ill PSYCH: Cooperative, without pressured speech EYE: Conjunctivae pink, sclerae  anicteric, mild exophthalmos is noted ENT: MMM, without oral ulcers, no erythema or exudates noted CV: RR without R/Gs  RESP: No adventitious breath sounds or wheezing present GI: NABS, soft, NT/ND, without rebound or guarding MSK/EXT: Bilateral lower extremity edema is present-trace SKIN: No jaundice NEURO:  Alert & Oriented x 3, no focal deficits   REVIEW OF DATA  I reviewed the following data at the time of this encounter:  GI Procedures and Studies  July 2020 EGD - LA Grade C acute and erosive esophagitis. Biopsied. - Z-line regular, 40 cm from the incisors. - Gastritis. Biopsied. - Non-bleeding duodenal ulcer with no stigmata of bleeding. - Normal second portion of the duodenum.  2014 colonoscopy Normal colon adequate prep with cleansing Anal warts present on exam Repeat in 10 years for screening  Laboratory Studies  Reviewed those in epic  Imaging Studies  No relevant studies to review   ASSESSMENT  Ms. Anthis is a 58 y.o. female with a pmh significant for Graves' disease, COPD, diabetes, chronic renal insufficiency stage V, hypertension, sickle cell trait, peptic ulcer disease, esophagitis, H. pylori infection, uninsured.  The patient is seen today for evaluation and management of:  1. Hx of Helicobacter infection   2. Iron deficiency anemia, unspecified iron deficiency anemia type   3. PUD (peptic ulcer disease)   4. Acute esophagitis    The patient is hemodynamically and clinically stable.  She recently had blood work showing a stable hemoglobin.  She had significant iron deficiency anemia in July and has not had iron indices repeated and she is not taking oral iron.  She has concerns about taking oral iron because it causes her to get more constipated and have significant abdominal pain.  She may benefit from IV iron infusions but as she is uninsured this will be very difficult for [place] to be able to perform.  She is willing to try iron in the coming weeks to see how  she does but will stop it if she starts having significant abdominal pain.  I asked her to let us know if that does occur so that we are aware.  She is going stop PPI therapy starting on Monday of this coming week and for 2 weeks hold it.  She would then come in and deliver a stool antigen for H. pylori to evaluate for eradication.  She will benefit from an upper endoscopy and potentially a repeat colonoscopy if she remains iron deficient but she cannot afford that currently.  We will plan for repeat labs to be done at the time of her follow-up in residents clinic and can move forward from there.  She denies any desire to have endoscopic evaluation.  All patient questions were answered, to the best of my ability, and the patient agrees to the aforementioned plan of action with follow-up as indicated.   PLAN  Send iron once daily to [facility] if not to [facility] as outlined below to be drawn in approximately 6 weeks (at resident clinic follow-up) H. pylori stool antigen to be drawn in 2 weeks after she is off PPI therapy for 2 weeks Restart PPI therapy after completion of the stool antigen If she remains iron deficient  she should probably have a repeat upper endoscopy/enteroscopy with colonoscopy and capsule endoscopy can be considered in the future Start taking MiraLAX daily to optimize movement of bowels while she get started on iron (over-the-counter)   Orders Placed This Encounter  Procedures   Helicobacter pylori special antigen   CBC   IBC + Ferritin    New Prescriptions   FERROUS GLUCONATE (FERGON) 324 MG TABLET    Take 1 tablet (324 mg total) by mouth daily with breakfast.   Modified Medications   No medications on file    Planned Follow Up No follow-ups on file.   Justice Britain, MD [facility] Office # [phone]

## 2019-07-04 NOTE — Progress Notes (Signed)
Internal Medicine Clinic Attending  Case discussed with Dr. Winfrey  at the time of the visit.  We reviewed the resident's history and exam and pertinent patient test results.  I agree with the assessment, diagnosis, and plan of care documented in the resident's note.  

## 2019-07-05 ENCOUNTER — Encounter: Payer: Self-pay | Admitting: Internal Medicine

## 2019-07-06 NOTE — Telephone Encounter (Signed)
Spoke with Kayla [name] and reiterated need for low potassium diet. She verbalized understanding . Information on low potassium diet was mailed to her.

## 2019-07-09 ENCOUNTER — Telehealth: Payer: Self-pay | Admitting: Pharmacist

## 2019-07-09 DIAGNOSIS — I10 Essential (primary) hypertension: Secondary | ICD-10-CM

## 2019-07-09 DIAGNOSIS — Z79899 Other long term (current) drug therapy: Secondary | ICD-10-CM

## 2019-07-13 MED ORDER — GABAPENTIN 600 MG PO TABS: 600.0000 mg | ORAL_TABLET | Freq: Three times a day (TID) | ORAL | 3 refills | Status: DC

## 2019-07-13 MED ORDER — TOPIRAMATE 25 MG PO CPSP: 25.0000 mg | ORAL_CAPSULE | ORAL | 3 refills | Status: DC

## 2019-07-13 MED ORDER — AMLODIPINE BESYLATE 2.5 MG PO TABS: 2.5000 mg | ORAL_TABLET | Freq: Every day | ORAL | 0 refills | Status: DC

## 2019-07-13 MED ORDER — FUROSEMIDE 20 MG PO TABS: 20.0000 mg | ORAL_TABLET | Freq: Two times a day (BID) | ORAL | 0 refills | Status: DC

## 2019-07-13 MED FILL — TOPIRAMATE 25 MG TAB: 25 | 30 days supply | Qty: 90 | Fill #0

## 2019-07-13 MED FILL — GABAPENTIN 600 MG TABLET: 600 | 30 days supply | Qty: 90 | Fill #0

## 2019-07-13 NOTE — Telephone Encounter (Signed)
Medications were reviewed with patient and refills sent in for amlodipine, furosemide, gabapentin, and topiramate. Patient correctly verbalizes medication regimen. Advised patient to contact clinic if concerns arise.

## 2019-07-22 ENCOUNTER — Other Ambulatory Visit: Payer: Self-pay

## 2019-07-22 ENCOUNTER — Ambulatory Visit (INDEPENDENT_AMBULATORY_CARE_PROVIDER_SITE_OTHER): Payer: Self-pay | Admitting: Radiation Oncology

## 2019-07-22 ENCOUNTER — Encounter: Payer: Self-pay | Admitting: Radiation Oncology

## 2019-07-22 VITALS — BP 189/69 | HR 76 | Temp 98.5°F | Ht 62.0 in | Wt 149.7 lb

## 2019-07-22 DIAGNOSIS — Z79899 Other long term (current) drug therapy: Secondary | ICD-10-CM

## 2019-07-22 DIAGNOSIS — F331 Major depressive disorder, recurrent, moderate: Secondary | ICD-10-CM

## 2019-07-22 DIAGNOSIS — Z0000 Encounter for general adult medical examination without abnormal findings: Secondary | ICD-10-CM

## 2019-07-22 DIAGNOSIS — F339 Major depressive disorder, recurrent, unspecified: Secondary | ICD-10-CM

## 2019-07-22 DIAGNOSIS — L300 Nummular dermatitis: Secondary | ICD-10-CM

## 2019-07-22 DIAGNOSIS — I10 Essential (primary) hypertension: Secondary | ICD-10-CM

## 2019-07-22 MED ORDER — VENLAFAXINE HCL ER 37.5 MG PO CP24: 37.5000 mg | ORAL_CAPSULE | Freq: Every day | ORAL | 2 refills | Status: DC

## 2019-07-22 MED ORDER — TRIAMCINOLONE ACETONIDE 0.1 % EX CREA: 1.0000 "application " | TOPICAL_CREAM | Freq: Every day | CUTANEOUS | 0 refills | Status: DC

## 2019-07-22 MED FILL — TRIAMCINOLONE 0.1% CREAM: 0.1 | 10 days supply | Qty: 30 | Fill #0

## 2019-07-22 MED FILL — VENLAFAXINE HCL ER 37.5 MG: 37.5 | 30 days supply | Qty: 30 | Fill #0

## 2019-07-22 NOTE — Assessment & Plan Note (Addendum)
-  pt with known hx of depression with a positive depression screen today -denies SI or HI -reports using Cymbalta previously but that it made her feel weird so she stopped using it even though it really helped with pain -will prescribe Effexor 37.5 mg as also effective for pain and depression -follow up in 3-4 weeks

## 2019-07-22 NOTE — Assessment & Plan Note (Signed)
-  pt had meeting with financial counseling but hasn't yet sent in necessary paperwork; brought it in today with hopes orange card will be available when she follows up in 4 weeks as patient has additional care gaps that need addressing when she has coverage -order placed for mammogram and information provided on obtaining a free mammogram -follow up in 4 weeks

## 2019-07-22 NOTE — Progress Notes (Signed)
   CC: rash  HPI:  [name] is a 58 y.o. F here with rash. Please see assessment and plan for full HPI.  Past Medical History:  Diagnosis Date  . ANA POSITIVE 02/09/2010  . Anal warts 11/09/2012  . Anxiety   . Arthritis    "my bones ache all the time" (06/27/2014)  . Cellulitis 06/27/2014   medial proximal left thigh extending to the perineium  . Chronic bronchitis ([place])    "get it changing of the seasons; sometimes q yr; sometimes not"   . Complication of anesthesia    sats drop post op  . COPD (chronic obstructive pulmonary disease) ([place])   . 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 Diabetic peripheral neuropathy ([place]) since <2005   "hands and feet"  . Dizziness    Multiple falls at home  . Fibromyalgia   . Graves' disease    "they nuked it twice"  . [id number])    "monthly" (06/27/2014)  . Heart murmur    as a child-never had echo to evaluate  . Hyperlipidemia   . Hypertension   . Hypothyroidism   . Sickle cell trait ([place])   . Trimalleolar fracture of left ankle 11/21/2014  . Type 2 diabetes mellitus ([place])   . Wears glasses    Review of Systems:    Review of Systems  Constitutional: Negative for chills and fever.  Respiratory: Negative for shortness of breath.   Gastrointestinal: Negative for abdominal pain.  Musculoskeletal: Positive for joint pain.  Skin: Positive for itching and rash.  Psychiatric/Behavioral: Positive for depression.  All other systems reviewed and are negative.   Physical Exam:  Vitals:   07/22/19 0950  BP: (!) 189/69  Pulse: 76  Temp: 98.5 F (36.9 C)  TempSrc: Oral  SpO2: 96%  Weight: 149 lb 11.2 oz (67.9 kg)  Height: 5\' 2"  (1.575 m)   Physical Exam  Constitutional: She is oriented to person, place, and time and well-developed, well-nourished, and in no distress. No distress.  Well appearing female in wheelchair  Neck: Normal range of motion.  Neurological: She is alert and oriented to person,  place, and time.  Skin: Skin is warm and dry. Rash (multiple, discreet, scattered circular, patches with central scaling on her lower extremities) noted. She is not diaphoretic.  Psychiatric: Affect normal.    Assessment & Plan:   See Encounters Tab for problem based charting.  Patient seen with Dr. Evette Doffing

## 2019-07-22 NOTE — Assessment & Plan Note (Signed)
Today's Vitals   07/22/19 0950 07/22/19 0952  BP: (!) 189/69   Pulse: 76   Temp: 98.5 F (36.9 C)   TempSrc: Oral   SpO2: 96%   Weight: 149 lb 11.2 oz (67.9 kg)   Height: 5\' 2"  (1.575 m)   PainSc:  10-Worst pain ever   Body mass index is 27.38 kg/m.  -pt with known hypertension with elevated BP today -pt reports not taking her blood pressure medications yet today -follow up in 4 weeks with afternoon appointment so patient will have taken BP meds and reevaluate at that time

## 2019-07-22 NOTE — Assessment & Plan Note (Addendum)
-  pt with known hx of eczema presenting with rash on her lower extremity -she reports the rash began a few weeks ago and has been spreading -the rash is just on her lower extremities but has spread from one limb to the other and some spots have grown in size -she reports the rash is itchy and sometimes burns -antifungal cream has not helped -on exam she has multiple, discreet, scattered circular, patches with central scaling on her lower extremities consistent with nummular ezcema -prescribed triamcinolone 0.1% cream with instructions to use daily on lesions and twice daily on largest lesion and to call if sx haven't resolved in one week -fu in 3-4 weeks

## 2019-07-22 NOTE — Patient Instructions (Signed)
Thank you for coming to your appointment. It was so nice to see you. Today we discussed  Essential hypertension, benign       -     follow up in 3-4 weeks with afternoon appointment and be sure to take your blood pressure medicine before coming in  Healthcare maintenance -     Mammogram ordered -     Will address additional healthcare maintenance at next visit once you have your orange card  Nummular eczematous dermatitis       -     Ordering steroid cream to put on spots on your legs once a day, twice a day on the larger spot       -     Please let us know if your symptoms don't improve within a week   We will see you in 3-4 week!  Sincerely,  Al Decant, MD

## 2019-07-23 ENCOUNTER — Telehealth: Payer: Self-pay | Admitting: Pharmacist

## 2019-07-23 DIAGNOSIS — E1140 Type 2 diabetes mellitus with diabetic neuropathy, unspecified: Secondary | ICD-10-CM

## 2019-07-23 DIAGNOSIS — IMO0002 Reserved for concepts with insufficient information to code with codable children: Secondary | ICD-10-CM

## 2019-07-23 MED ORDER — CONTOUR NEXT TEST VI STRP: ORAL_STRIP | 3 refills | Status: DC

## 2019-07-23 MED ORDER — BAYER MICROLET LANCETS MISC: 12 refills | Status: DC

## 2019-07-23 MED FILL — TOPIRAMATE 25 MG TAB: 25 | 30 days supply | Qty: 90 | Fill #0

## 2019-07-23 MED FILL — MICROLET LANCETS MISC: 30 days supply | Qty: 100 | Fill #0

## 2019-07-23 MED FILL — CONTOUR NEXT STRIPS: 30 days supply | Qty: 100 | Fill #0

## 2019-07-23 MED FILL — GABAPENTIN 600 MG TABLET: 600 | 30 days supply | Qty: 90 | Fill #0

## 2019-07-23 NOTE — Telephone Encounter (Signed)
Patient requested refill on glucose test strips

## 2019-07-23 NOTE — Progress Notes (Signed)
Internal Medicine Clinic Attending  I saw and evaluated the patient.  I personally confirmed the key portions of the history and exam documented by Dr. Lanier and I reviewed pertinent patient test results.  The assessment, diagnosis, and plan were formulated together and I agree with the documentation in the resident's note.   

## 2019-07-23 NOTE — Addendum Note (Signed)
Addended by: Resa Miner on: 07/23/2019 02:09 PM   Modules accepted: Orders

## 2019-10-25 ENCOUNTER — Ambulatory Visit (INDEPENDENT_AMBULATORY_CARE_PROVIDER_SITE_OTHER): Payer: Self-pay | Admitting: Internal Medicine

## 2019-10-25 VITALS — BP 174/70 | HR 76 | Temp 98.3°F | Ht 62.0 in | Wt 152.5 lb

## 2019-10-25 DIAGNOSIS — Z79890 Hormone replacement therapy (postmenopausal): Secondary | ICD-10-CM

## 2019-10-25 DIAGNOSIS — E1140 Type 2 diabetes mellitus with diabetic neuropathy, unspecified: Secondary | ICD-10-CM

## 2019-10-25 DIAGNOSIS — IMO0002 Reserved for concepts with insufficient information to code with codable children: Secondary | ICD-10-CM

## 2019-10-25 DIAGNOSIS — Z79899 Other long term (current) drug therapy: Secondary | ICD-10-CM

## 2019-10-25 DIAGNOSIS — E890 Postprocedural hypothyroidism: Secondary | ICD-10-CM

## 2019-10-25 DIAGNOSIS — Z9114 Patient's other noncompliance with medication regimen: Secondary | ICD-10-CM

## 2019-10-25 DIAGNOSIS — Z87891 Personal history of nicotine dependence: Secondary | ICD-10-CM

## 2019-10-25 DIAGNOSIS — I10 Essential (primary) hypertension: Secondary | ICD-10-CM

## 2019-10-25 DIAGNOSIS — M797 Fibromyalgia: Secondary | ICD-10-CM

## 2019-10-25 DIAGNOSIS — E1165 Type 2 diabetes mellitus with hyperglycemia: Secondary | ICD-10-CM

## 2019-10-25 DIAGNOSIS — L300 Nummular dermatitis: Secondary | ICD-10-CM

## 2019-10-25 DIAGNOSIS — Z794 Long term (current) use of insulin: Secondary | ICD-10-CM

## 2019-10-25 LAB — POCT GLYCOSYLATED HEMOGLOBIN (HGB A1C): Hemoglobin A1C: 5.2 % (ref 4.0–5.6)

## 2019-10-25 LAB — GLUCOSE, CAPILLARY: Glucose-Capillary: 177 mg/dL — ABNORMAL HIGH (ref 70–99)

## 2019-10-25 MED ORDER — AMLODIPINE BESYLATE 5 MG PO TABS: 5.0000 mg | ORAL_TABLET | Freq: Every day | ORAL | 0 refills | Status: DC

## 2019-10-25 MED FILL — AMLODIPINE BESYLATE 5 MG TA: 5 | 30 days supply | Qty: 30 | Fill #0

## 2019-10-25 NOTE — Progress Notes (Signed)
CC: Generalized body pains, hypertension, hypothyroidism, and skin issues  HPI:  [name] is a 59 y.o.  with a PMH listed below presenting for generalized body pains, hypertension, hypothyroidism, and skin issues.    Please see A&P for status of the patient's chronic medical conditions  Past Medical History:  Diagnosis Date  . ANA POSITIVE 02/09/2010  . Anal warts 11/09/2012  . Anxiety   . Arthritis    "my bones ache all the time" (06/27/2014)  . Cellulitis 06/27/2014   medial proximal left thigh extending to the perineium  . Chronic bronchitis ([place])    "get it changing of the seasons; sometimes q yr; sometimes not"   . Complication of anesthesia    sats drop post op  . COPD (chronic obstructive pulmonary disease) ([place])   . 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 Diabetic peripheral neuropathy ([place]) since <2005   "hands and feet"  . Dizziness    Multiple falls at home  . Fibromyalgia   . Graves' disease    "they nuked it twice"  . [id number])    "monthly" (06/27/2014)  . Heart murmur    as a child-never had echo to evaluate  . Hyperlipidemia   . Hypertension   . Hypothyroidism   . Sickle cell trait ([place])   . Trimalleolar fracture of left ankle 11/21/2014  . Type 2 diabetes mellitus ([place])   . Wears glasses    Review of Systems: Refer to history of present illness and assessment and plans for pertinent review of systems, all others reviewed and negative.  Physical Exam:  Vitals:   10/25/19 1557 10/25/19 1648  BP: (!) 179/66 (!) 174/70  Pulse: 81 76  Temp: 98.3 F (36.8 C)   TempSrc: Oral   SpO2: 100%   Weight: 152 lb 8 oz (69.2 kg)   Height: 5\' 2"  (1.575 m)    Physical Exam  Constitutional: She is oriented to person, place, and time.  Tired appearing female, NAD, sitting in wheelchair  HENT:  Head: Normocephalic and atraumatic.  Eyes: Pupils are equal, round, and reactive to light. Conjunctivae are normal.  Cardiovascular:  Normal rate, regular rhythm and normal heart sounds.  Pulmonary/Chest: Breath sounds normal. No respiratory distress.  Abdominal: Soft. Bowel sounds are normal. She exhibits no distension.  Musculoskeletal:     Cervical back: Normal range of motion and neck supple.     Comments: Movement limited by pain, left hip TTP, tenderness on palpation over bilateral hip joints, elbow joints, and knee joints. No obvious swelling or redness noted  Neurological: She is alert and oriented to person, place, and time.  Skin: Skin is warm.  BL LE has dry, flaky, scaly skin, diffuse  Psychiatric: Mood and affect normal.    Social History   Socioeconomic History  . Marital status: Single    Spouse name: Not on file  . Number of children: 2  . Years of education: 68  . Highest education level: Not on file  Occupational History    Employer: UNEMPLOYED  Tobacco Use  . Smoking status: Former Smoker    Packs/day: 0.75    Years: 5.00    Pack years: 3.75    Types: Cigarettes    Quit date: 09/30/1990    Years since quitting: 29.0  . Smokeless tobacco: Never Used  Substance and Sexual Activity  . Alcohol use: No    Alcohol/week: 0.0 standard drinks  . Drug use: No  .  Sexual activity: Not Currently  Other Topics Concern  . Not on file  Social History Narrative   Lives in [place], alone. Looking for a job. Has a son in town who checks in on her.    Social Determinants of Health   Financial Resource Strain:   . Difficulty of Paying Living Expenses: Not on file  Food Insecurity:   . Worried About Charity fundraiser in the Last Year: Not on file  . Ran Out of Food in the Last Year: Not on file  Transportation Needs:   . Lack of Transportation (Medical): Not on file  . Lack of Transportation (Non-Medical): Not on file  Physical Activity:   . Days of Exercise per Week: Not on file  . Minutes of Exercise per Session: Not on file  Stress:   . Feeling of Stress : Not on file  Social Connections:    . Frequency of Communication with Friends and Family: Not on file  . Frequency of Social Gatherings with Friends and Family: Not on file  . Attends Religious Services: Not on file  . Active Member of Clubs or Organizations: Not on file  . Attends Archivist Meetings: Not on file  . Marital Status: Not on file  Intimate Partner Violence:   . Fear of Current or Ex-Partner: Not on file  . Emotionally Abused: Not on file  . Physically Abused: Not on file  . Sexually Abused: Not on file   Family History  Problem Relation Age of Onset  . Emphysema Mother   . Cancer Mother        unknown  . Hypertension Mother   . Cancer Father        pancreatic   . Diabetes Father   . Hypertension Father   . Down syndrome Brother   . Cancer Maternal Aunt        cancer  . Cancer Other        breast cancer aunt  . Stroke Sister   . Diabetes Sister   . Colon cancer Neg Hx   . Stomach cancer Neg Hx   . Esophageal cancer Neg Hx   . Inflammatory bowel disease Neg Hx   . Liver disease Neg Hx   . Pancreatic cancer Neg Hx   . Rectal cancer Neg Hx     Assessment & Plan:   See Encounters Tab for problem based charting.  Patient discussed with Dr. Rebeca Alert

## 2019-10-25 NOTE — Patient Instructions (Addendum)
Ms. Beatrice Mire,  It was a pleasure to see you today. Thank you for coming in.   Today we discussed your diabetes, bone pains, skin issues, and blood pressure. In regards to your bone pains, please continue taking your current medications. I have placed a referral for psychiatry to see if they may add anything to your regimen.  In regards to your skin issues, please start using Eucerin cream, from the tube, please start using it twice a day, immediately after getting out from the shower.  In regards to your thyroid, please try to take the synthroid daily.  In regards to your blood pressure, your blood pressure was still elevated. Please increase your amlodipine to 5 mg daily. Continue taking the lasix twice a day. Please follow up with the kidney doctor.   Please return to clinic in 1 month or sooner if needed.   Thank you again for coming in.   Asencion Noble.D.

## 2019-10-26 LAB — BMP8+ANION GAP
Anion Gap: 11 mmol/L (ref 10.0–18.0)
BUN/Creatinine Ratio: 10 (ref 9–23)
BUN: 43 mg/dL — ABNORMAL HIGH (ref 6–24)
CO2: 15 mmol/L — ABNORMAL LOW (ref 20–29)
Calcium: 8.3 mg/dL — ABNORMAL LOW (ref 8.7–10.2)
Chloride: 115 mmol/L — ABNORMAL HIGH (ref 96–106)
Creatinine, Ser: 4.16 mg/dL — ABNORMAL HIGH (ref 0.57–1.00)
GFR calc Af Amer: 13 mL/min/{1.73_m2} — ABNORMAL LOW (ref >59)
GFR calc non Af Amer: 11 mL/min/{1.73_m2} — ABNORMAL LOW (ref >59)
Glucose: 186 mg/dL — ABNORMAL HIGH (ref 65–99)
Potassium: 4.9 mmol/L (ref 3.5–5.2)
Sodium: 141 mmol/L (ref 134–144)

## 2019-10-26 LAB — TSH: TSH: 220 u[IU]/mL — ABNORMAL HIGH (ref 0.450–4.500)

## 2019-10-26 NOTE — Assessment & Plan Note (Signed)
Patient still has a diffuse dry, scaly rash on her lower leg that she reports it itchy. She tried using the triamcinolone cream however reports that it did not improve. She is currently using Eucerin cream daily after getting out of the shower, she reports that this has helped it not spread. On exam she has dry, scaly skin on her lower extremities. Does seem to be consistent with ezcema. Discussed using the eucerin 2x per daily, and the importance of being consistent with this. She expressed understanding and will try this.   -Consistent use of eucerin BID -f/u in 1 month

## 2019-10-26 NOTE — Progress Notes (Signed)
Internal Medicine Clinic Attending  Case discussed with Dr. Krienke at the time of the visit.  We reviewed the resident's history and exam and pertinent patient test results.  I agree with the assessment, diagnosis, and plan of care documented in the resident's note.  Neetu Carrozza, M.D., Ph.D.  

## 2019-10-26 NOTE — Assessment & Plan Note (Signed)
Patient is supposed to be on synthroid 150 mcg daily however she reports only taking it about 4/7 days of the week. She reports that it causes her to feel "crazy" when she takes it, becomes very anxious and irritable. Last TSH was 153 on 06/30/19, she was not taking it consistently at that time either. We did discuss that we may try to go down on the dose if she will take it daily however she reported that she the dose did not matter. Discussed the importance of taking her synthroid daily. She may need an endocrinology referral to discuss other options, she is in the process of obtaining an orange card at this time.   -Continue synthroid 150 mcg daily, advised to take daily -Check TSH -Consider endocrinology referral in the future

## 2019-10-26 NOTE — Assessment & Plan Note (Signed)
Patient is on amlodipine 2.5 mg daily and lasix 20 mg BID, she reports that she is taking these medications. BP today is elevated at 179/66, she reports that this is around where it's been. Patient currently has CKD stage 4, there is discussion with the nephrologist about pursuing dialysis in the near future. She is currently not a candidate for and ACE-I. We discussed increasing amlodipine for now and following up with nephrology.  -Increase amlodipine to 5 mg daily -Continue taking lasix BID. -F/u with nephrology

## 2019-10-26 NOTE — Assessment & Plan Note (Signed)
Patient is currently taking insulin as needed, she will take either 5-10 units of insulin daily. She reports that her blood sugars are around 105, 90,80 at home. Denies any issues taking her insulin, was not interested in changing this for now. A1c today is 5.6. Will continue this for now.

## 2019-10-26 NOTE — Assessment & Plan Note (Signed)
Patient is reporting generalized body pains and bone pains, she reports that she thinks her symptoms are getting worse and is having difficulty with ambulation and standing up. She also reports shoulder pains. She is currently on topamax 25 mg TID, and gabapentin 600 mg TID for possible fibromyalgia. She also reports using marijuana occasionally to help with the pain. She is seeing a pain management doctor however reports that they are only discussing her left leg pain and trying to arrange for a stimulator to be placed. She seems to have a history of fibromyalgia. Her hypothyroidism is currently not under control, discussed that this can sometimes worsen pain from fibromyalgia and discussed the importance of being consistent with her medications. Also discussed that she may benefit from a psychiatry referral to discuss other treatment options.  -Continue topamax 25 mg TID and 50 mg daily -Continue gabapentin 600 mg TID -[facility] referral for psychiatry referral

## 2019-11-04 ENCOUNTER — Telehealth: Payer: Self-pay | Admitting: Licensed Clinical Social Worker

## 2019-11-04 NOTE — Telephone Encounter (Signed)
Patient was contacted to provide resources for psychiatry services based on her insurance. Patient was given two locations and phone numbers.

## 2019-11-05 ENCOUNTER — Other Ambulatory Visit: Payer: Self-pay | Admitting: Internal Medicine

## 2019-11-05 MED ORDER — SODIUM BICARBONATE 650 MG PO TABS: 1300.0000 mg | ORAL_TABLET | Freq: Three times a day (TID) | ORAL | 1 refills | Status: AC

## 2019-11-05 NOTE — Progress Notes (Signed)
Contacted patient regarding results of recent lab work.  Discussed that her TSH was elevated, we expected this due to not taking her thyroid medication daily, informed her of the importance of taking her thyroid medication daily.  Also discussed her low bicarb level and possibility of starting sodium bicarb.  She reports that she still has a bottle of sodium bicarb from her last hospital admission.  On chart review she was given sodium bicarbonate 1300 mg 3 times daily and advised to follow-up with nephrology outpatient.  Advised her to restart taking this medication for now.  She has not been able to follow-up with nephrology due to financial issues.  Discussed that we will follow-up in a few weeks to repeat labs. Will add sodium bicarb back to her medication list.

## 2020-03-06 ENCOUNTER — Encounter: Payer: Self-pay | Admitting: Internal Medicine

## 2020-03-08 ENCOUNTER — Other Ambulatory Visit: Payer: Self-pay | Admitting: *Deleted

## 2020-03-08 DIAGNOSIS — I10 Essential (primary) hypertension: Secondary | ICD-10-CM

## 2020-03-08 MED ORDER — AMLODIPINE BESYLATE 5 MG PO TABS: 5.0000 mg | ORAL_TABLET | Freq: Every day | ORAL | 0 refills | Status: DC

## 2020-03-08 MED ORDER — FUROSEMIDE 20 MG PO TABS: 20.0000 mg | ORAL_TABLET | Freq: Two times a day (BID) | ORAL | 0 refills | Status: DC

## 2020-03-08 NOTE — Telephone Encounter (Signed)
Last visit 10/25/2019 Next appt 03/27/2020

## 2020-03-27 ENCOUNTER — Encounter: Payer: Self-pay | Admitting: Internal Medicine

## 2020-03-27 ENCOUNTER — Ambulatory Visit (INDEPENDENT_AMBULATORY_CARE_PROVIDER_SITE_OTHER): Payer: Self-pay | Admitting: Internal Medicine

## 2020-03-27 ENCOUNTER — Other Ambulatory Visit: Payer: Self-pay

## 2020-03-27 VITALS — BP 178/86 | HR 70 | Temp 98.1°F | Ht 62.0 in | Wt 134.9 lb

## 2020-03-27 DIAGNOSIS — E1165 Type 2 diabetes mellitus with hyperglycemia: Secondary | ICD-10-CM

## 2020-03-27 DIAGNOSIS — IMO0002 Reserved for concepts with insufficient information to code with codable children: Secondary | ICD-10-CM

## 2020-03-27 DIAGNOSIS — I10 Essential (primary) hypertension: Secondary | ICD-10-CM

## 2020-03-27 DIAGNOSIS — E1140 Type 2 diabetes mellitus with diabetic neuropathy, unspecified: Secondary | ICD-10-CM

## 2020-03-27 DIAGNOSIS — R1013 Epigastric pain: Secondary | ICD-10-CM

## 2020-03-27 LAB — POCT GLYCOSYLATED HEMOGLOBIN (HGB A1C): Hemoglobin A1C: 5.2 % (ref 4.0–5.6)

## 2020-03-27 LAB — GLUCOSE, CAPILLARY: Glucose-Capillary: 179 mg/dL — ABNORMAL HIGH (ref 70–99)

## 2020-03-27 MED ORDER — PANTOPRAZOLE SODIUM 20 MG PO TBEC
20.0000 mg | DELAYED_RELEASE_TABLET | Freq: Every day | ORAL | 5 refills | Status: DC
Start: 2020-03-27 — End: 2020-03-27

## 2020-03-27 MED ORDER — AMLODIPINE BESYLATE 5 MG PO TABS: 5.0000 mg | ORAL_TABLET | Freq: Every day | ORAL | 5 refills | Status: DC

## 2020-03-27 MED ORDER — PANTOPRAZOLE SODIUM 20 MG PO TBEC: 20.0000 mg | DELAYED_RELEASE_TABLET | Freq: Every day | ORAL | 5 refills | Status: DC

## 2020-03-27 MED FILL — AMLODIPINE BESYLATE 5 MG TA: 5 | 30 days supply | Qty: 30 | Fill #0

## 2020-03-27 MED FILL — PANTOPRAZOLE SOD DR 20 MG T: 20 | 30 days supply | Qty: 30 | Fill #0

## 2020-03-27 NOTE — Progress Notes (Signed)
CC: Concern about pancreatic cancer  HPI:  Kayla [name] is a 59 y.o. with the history listed below including uncontrolled hypothyroidism and HTN presenting for concerns about pancreatic cancer.   Patient reports that she had a fall about 1 month ago, her left leg gave out and she fell forward. Denies any LOC at that time. Since then she reports that she has been having this epigastric pain, it's a constant pain. Also reports a bloating sensation, yellow stools, and decreased appetite.  Also endorses nausea, and altered change in taste. Also endorses weight loss. She denies any fevers, chills, vomiting or hematochezia. She does have a history of hypothyroidism and reports that she has not taken any Synthroid for the past 2 weeks, also endorses dry skin.  She re also well ports that she has a significant family history of pancreatic cancer and is very concerned about this.   Past Medical History:  Diagnosis Date  . ANA POSITIVE 02/09/2010  . Anal warts 11/09/2012  . Anxiety   . Arthritis    "my bones ache all the time" (06/27/2014)  . Cellulitis 06/27/2014   medial proximal left thigh extending to the perineium  . Chronic bronchitis ([place])    "get it changing of the seasons; sometimes q yr; sometimes not"   . Complication of anesthesia    sats drop post op  . COPD (chronic obstructive pulmonary disease) ([place])   . 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 Diabetic peripheral neuropathy ([place]) since <2005   "hands and feet"  . Dizziness    Multiple falls at home  . Fibromyalgia   . Graves' disease    "they nuked it twice"  . CVELFYBO(175.1)    "monthly" (06/27/2014)  . Heart murmur    as a child-never had echo to evaluate  . Hyperlipidemia   . Hypertension   . Hypothyroidism   . Sickle cell trait ([place])   . Trimalleolar fracture of left ankle 11/21/2014  . Type 2 diabetes mellitus ([place])   . Wears glasses    Review of Systems:   Constitutional: Negative for  chills and fever. Positive for fatigue and weakness.  Respiratory: Negative for shortness of breath.   Cardiovascular: Negative for chest pain and leg swelling.  Gastrointestinal: Positive for abdominal pain, nausea, decreased appetite, weight loss, and yellow stools. Negative for vomiting.  Neurological: Negative for dizziness and headaches.   Physical Exam:  Vitals:   03/27/20 1355 03/27/20 1358  BP: (!) 206/80 (!) 178/86  Pulse: 74 70  Temp: 98.1 F (36.7 C)   TempSrc: Oral   SpO2: 100%   Weight: 134 lb 14.4 oz (61.2 kg)   Height: 5\' 2"  (1.575 m)    Physical Exam Constitutional:      Appearance: Normal appearance.  Cardiovascular:     Rate and Rhythm: Normal rate and regular rhythm.     Pulses: Normal pulses.     Heart sounds: Normal heart sounds.  Pulmonary:     Effort: Pulmonary effort is normal.     Breath sounds: Normal breath sounds.  Abdominal:     General: Abdomen is flat. Bowel sounds are normal. There is no distension.     Palpations: Abdomen is soft. There is no mass.     Tenderness: There is abdominal tenderness (Epigastric tenderness). There is no guarding or rebound.  Musculoskeletal:        General: No swelling. Normal range of motion.  Skin:  General: Skin is warm and dry.     Capillary Refill: Capillary refill takes less than 2 seconds.  Neurological:     General: No focal deficit present.     Mental Status: She is alert and oriented to person, place, and time.  Psychiatric:        Behavior: Behavior normal.     Comments: Agitated, irritable    Assessment & Plan:   See Encounters Tab for problem based charting.  Patient discussed with Dr. Rebeca Alert

## 2020-03-27 NOTE — Patient Instructions (Addendum)
Ms. Cheria Sadiq,  It was a pleasure to see you today. Thank you for coming in.   Today we discussed your concerns about pancreatic cancer. In regards to this we are obtaining labs and imaging to evaluate this. We will call you with the results. You can start taking the pantoprazole daily.   We also discussed your hypothyroidism. Please start taking your synthroid daily.  We also discussed your blood pressure. Please start taking the amlodipine daily.   Please return to clinic in 1 month or sooner if needed.   Thank you again for coming in.   Asencion Noble.D.

## 2020-03-28 DIAGNOSIS — R109 Unspecified abdominal pain: Secondary | ICD-10-CM | POA: Insufficient documentation

## 2020-03-28 LAB — CMP14 + ANION GAP
ALT: 7 IU/L (ref 0–32)
AST: 14 IU/L (ref 0–40)
Albumin/Globulin Ratio: 1.5 (ref 1.2–2.2)
Albumin: 4.3 g/dL (ref 3.8–4.9)
Alkaline Phosphatase: 133 IU/L — ABNORMAL HIGH (ref 48–121)
Anion Gap: 14 mmol/L (ref 10.0–18.0)
BUN/Creatinine Ratio: 8 — ABNORMAL LOW (ref 9–23)
BUN: 54 mg/dL — ABNORMAL HIGH (ref 6–24)
Bilirubin Total: 0.3 mg/dL (ref 0.0–1.2)
CO2: 13 mmol/L — ABNORMAL LOW (ref 20–29)
Calcium: 9 mg/dL (ref 8.7–10.2)
Chloride: 109 mmol/L — ABNORMAL HIGH (ref 96–106)
Creatinine, Ser: 6.39 mg/dL — ABNORMAL HIGH (ref 0.57–1.00)
GFR calc Af Amer: 8 mL/min/{1.73_m2} — ABNORMAL LOW (ref >59)
GFR calc non Af Amer: 7 mL/min/{1.73_m2} — ABNORMAL LOW (ref >59)
Globulin, Total: 2.8 g/dL (ref 1.5–4.5)
Glucose: 188 mg/dL — ABNORMAL HIGH (ref 65–99)
Potassium: 4.8 mmol/L (ref 3.5–5.2)
Sodium: 136 mmol/L (ref 134–144)
Total Protein: 7.1 g/dL (ref 6.0–8.5)

## 2020-03-28 LAB — CBC
Hematocrit: 24.7 % — ABNORMAL LOW (ref 34.0–46.6)
Hemoglobin: 8.1 g/dL — ABNORMAL LOW (ref 11.1–15.9)
MCH: 31.9 pg (ref 26.6–33.0)
MCHC: 32.8 g/dL (ref 31.5–35.7)
MCV: 97 fL (ref 79–97)
Platelets: 125 10*3/uL — ABNORMAL LOW (ref 150–450)
RBC: 2.54 x10E6/uL — CL (ref 3.77–5.28)
RDW: 13.4 % (ref 11.7–15.4)
WBC: 10.2 10*3/uL (ref 3.4–10.8)

## 2020-03-28 NOTE — Assessment & Plan Note (Signed)
Patient is hypertensive to 206/80, repeat was 178/86. She is not taking the levothyroixine or her amlidopine. She is taking lasix daily. She reports that she needs a refill on amlodipine. She reported that she just wanted to see if she had pancreatic cancer and was not interested in talking about anything else. We discussed that we need to address her blood pressure because it could be contributing to her symptoms, she reported that she did not want to.   -Refill amlodipine 5 mg daily -RTC in 1 month, likely will need additional medications

## 2020-03-28 NOTE — Assessment & Plan Note (Signed)
Patient reports that she had a fall about 1 month ago, her left leg gave out and she fell forward. Denies any LOC at that time. Since then she reports that she has been having this epigastric pain, it's a constant pain. Also reports a bloating sensation, yellow stools, and decreased appetite.  Also endorses nausea, and altered change in taste. Also endorses weight loss. She denies any fevers, chills, vomiting or hematochezia. She does have a history of hypothyroidism and reports that she has not taken any Synthroid for the past 2 weeks, also endorses dry skin.  She re also well ports that she has a significant family history of pancreatic cancer and is very concerned about this.  On exam she appears tired, no acute distress, has ophthalmopathy, epigastric pain to palpation, normoactive bowel sounds, no guarding or rebound noted. She reported that she did not want to talk about anything else, she is aware that her blood pressure is uncontrolled and that she is not taking any hypothyroidism medication.  When trying to discuss her thyroid dysfunction that she significantly elevated TSH on her last visit due to not taking her Synthroid appropriately she became very angry and stated that she just wanted to know if if she had pancreatic cancer. We discussed that we should address all her issues since the uncontrolled hypothyroidism can be contributing. Given her symptoms, exam, and weight loss as well as the family history of pancreatic cancer there is a concern about this. She has CKD and can not get contrast due to this. Will obtain CT abdomen/pelvis without contrast to evaluate. Will also start on pantoprazole daily for possible dyspepsia, she does have a PUD and a history of H pylori. This could be 2/2 her hypothyroidism given her medication non-compliance.   -CT abdomen contrast -Pantoprazole 40 mg daily -Advised to restart her levothyroxine, will need to have further discussion about this and other chronic  medical conditions in the future

## 2020-03-29 NOTE — Progress Notes (Signed)
Internal Medicine Clinic Attending  Case discussed with Dr. Krienke at the time of the visit.  We reviewed the resident's history and exam and pertinent patient test results.  I agree with the assessment, diagnosis, and plan of care documented in the resident's note.  Sylar Voong, M.D., Ph.D.  

## 2020-04-13 ENCOUNTER — Other Ambulatory Visit: Payer: Self-pay

## 2020-04-13 ENCOUNTER — Ambulatory Visit (HOSPITAL_COMMUNITY)
Admission: RE | Admit: 2020-04-13 | Discharge: 2020-04-13 | Disposition: A | Discharge status: Home / Self Care | Payer: Self-pay | Source: Ambulatory Visit | Attending: Internal Medicine | Admitting: Internal Medicine

## 2020-04-13 DIAGNOSIS — R1013 Epigastric pain: Secondary | ICD-10-CM | POA: Insufficient documentation

## 2020-04-13 MED ORDER — IOHEXOL 9 MG/ML PO SOLN: 500.0000 mL | ORAL | Status: AC

## 2020-04-13 MED ORDER — IOHEXOL 9 MG/ML PO SOLN
ORAL | Status: AC
  Filled 2020-04-13: qty 1000

## 2020-04-21 ENCOUNTER — Telehealth: Payer: Self-pay

## 2020-04-21 NOTE — Telephone Encounter (Signed)
Attempted to call patient back regarding her results. She did not answer. I was not able to leave a message.

## 2020-04-21 NOTE — Telephone Encounter (Signed)
Requesting test results, please call pt back.  

## 2020-04-25 NOTE — Telephone Encounter (Signed)
Called pt - no answer; left message on self identified vm to call the office for results.Marland Kitchen

## 2020-05-04 ENCOUNTER — Telehealth: Payer: Self-pay | Admitting: Internal Medicine

## 2020-05-04 NOTE — Telephone Encounter (Signed)
Contacted patient regarding results of the CT scan. Her main concern was the pancreas, informed her that the study was limited due to not having contrast however there were no masses or abnormalities seen. There was mild inflammation noted around the ileus. She reports still having some abdominal pain. She reports that she is not taking the protonix. Advised to start taking this to see if it helps with her abdominal pain.

## 2020-08-02 ENCOUNTER — Other Ambulatory Visit: Payer: Self-pay

## 2020-08-02 ENCOUNTER — Encounter: Payer: Self-pay | Admitting: Internal Medicine

## 2020-08-02 ENCOUNTER — Ambulatory Visit (INDEPENDENT_AMBULATORY_CARE_PROVIDER_SITE_OTHER): Payer: Self-pay | Admitting: Internal Medicine

## 2020-08-02 VITALS — BP 166/103 | HR 73 | Temp 98.7°F | Ht 62.0 in

## 2020-08-02 DIAGNOSIS — H902 Conductive hearing loss, unspecified: Secondary | ICD-10-CM

## 2020-08-02 DIAGNOSIS — M25572 Pain in left ankle and joints of left foot: Secondary | ICD-10-CM

## 2020-08-02 DIAGNOSIS — E890 Postprocedural hypothyroidism: Secondary | ICD-10-CM

## 2020-08-02 DIAGNOSIS — I10 Essential (primary) hypertension: Secondary | ICD-10-CM

## 2020-08-02 DIAGNOSIS — IMO0002 Reserved for concepts with insufficient information to code with codable children: Secondary | ICD-10-CM

## 2020-08-02 DIAGNOSIS — M25472 Effusion, left ankle: Secondary | ICD-10-CM

## 2020-08-02 DIAGNOSIS — E1140 Type 2 diabetes mellitus with diabetic neuropathy, unspecified: Secondary | ICD-10-CM

## 2020-08-02 LAB — GLUCOSE, CAPILLARY: Glucose-Capillary: 128 mg/dL — ABNORMAL HIGH (ref 70–99)

## 2020-08-02 MED ORDER — LEVOTHYROXINE SODIUM 200 MCG PO TABS: 200.0000 ug | ORAL_TABLET | ORAL | 2 refills | Status: DC

## 2020-08-02 MED FILL — LEVOTHYROXINE SODIUM 200 MC: 200 | 28 days supply | Qty: 4 | Fill #0

## 2020-08-02 NOTE — Patient Instructions (Addendum)
[name],  It was a pleasure to see you today. Thank you for coming in.   Today we discussed your leg pain and twitching. Please continue to follow up with the pain management doctor to adjust your pain medications, we will need to get their records so we can update our system. It is unclear what is causing the leg twitching but uncontrolled thyroid disorders could be contributing. Please start taking the thyroid medication once per week.   We also discussed your blood pressure. Your blood pressure was elevated today, please restart the amlodipine daily.   We discussed your hearing issues and head thumping. We irrigated your ear today. I have placed a referral to the ENT doctor. You can meet with the financial counselor.   Please return to clinic in 6 weeks or sooner if needed.   Thank you again for coming in.   Asencion Noble.D.

## 2020-08-02 NOTE — Assessment & Plan Note (Signed)
Patient is supposed to be on amlodipine 5 mg daily, she reports she does not take this medication.  She also has uncontrolled hypothyroidism.  Blood pressure today is elevated at 166/83.  Discussed the importance of blood pressure control and encourage resuming amlodipine 5 mg daily.

## 2020-08-02 NOTE — Progress Notes (Signed)
CC: Decreased hearing in left ear, head pounding, hypothyroidism, HTN, diffuse pai and ankle pain  HPI:  [name] is a 59 y.o. with the history listed below presenting with multiple complaints including decreased hearing in left ear, head pounding, hypothyroidism, HTN, diffuse pain and ankle pain.   Past Medical History:  Diagnosis Date  . ANA POSITIVE 02/09/2010  . Anal warts 11/09/2012  . Anxiety   . Arthritis    "my bones ache all the time" (06/27/2014)  . Cellulitis 06/27/2014   medial proximal left thigh extending to the perineium  . Chronic bronchitis ([place])    "get it changing of the seasons; sometimes q yr; sometimes not"   . Complication of anesthesia    sats drop post op  . COPD (chronic obstructive pulmonary disease) ([place])   . 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 Diabetic peripheral neuropathy ([place]) since <2005   "hands and feet"  . Dizziness    Multiple falls at home  . Fibromyalgia   . Graves' disease    "they nuked it twice"  . XTGGYIRS(854.6)    "monthly" (06/27/2014)  . Heart murmur    as a child-never had echo to evaluate  . Hyperlipidemia   . Hypertension   . Hypothyroidism   . Sickle cell trait ([place])   . Trimalleolar fracture of left ankle 11/21/2014  . Type 2 diabetes mellitus ([place])   . Wears glasses    Review of Systems:   Constitutional: Negative for chills and fever.  Respiratory: Negative for shortness of breath.   Cardiovascular: Negative for chest pain and leg swelling.  Gastrointestinal: Negative for abdominal pain, nausea and vomiting.  Neurological: Negative for dizziness and headaches.   Physical Exam:  Vitals:   08/02/20 1334  BP: (!) 166/103  Pulse: 73  Temp: 98.7 F (37.1 C)  TempSrc: Oral  SpO2: 96%  Height: 5\' 2"  (1.575 m)   Physical Exam Constitutional:      Appearance: Normal appearance.  HENT:     Head: Normocephalic and atraumatic.     Right Ear: External ear normal. There is impacted  cerumen.     Left Ear: External ear normal. There is impacted cerumen.     Ears:     Comments: No erythema or edema noted, decreased hearing to finger rub    Nose: Nose normal.     Mouth/Throat:     Mouth: Mucous membranes are moist.     Pharynx: Oropharynx is clear.  Eyes:     Conjunctiva/sclera: Conjunctivae normal.     Pupils: Pupils are equal, round, and reactive to light.  Cardiovascular:     Rate and Rhythm: Normal rate and regular rhythm.  Pulmonary:     Effort: Pulmonary effort is normal.     Breath sounds: Normal breath sounds.  Abdominal:     General: Abdomen is flat. Bowel sounds are normal.     Palpations: Abdomen is soft.  Musculoskeletal:        General: Swelling and tenderness (TTP over left ankle. Point tenderness over left leg, right leg, right arm, left arm) present.  Skin:    General: Skin is warm and dry.     Capillary Refill: Capillary refill takes less than 2 seconds.  Neurological:     General: No focal deficit present.     Mental Status: She is alert and oriented to person, place, and time.     Comments: Left lower extremity tremor, worse  with movement  Psychiatric:        Mood and Affect: Mood normal.        Behavior: Behavior normal.      Assessment & Plan:   See Encounters Tab for problem based charting.  Patient discussed with Dr. Heber Coamo

## 2020-08-02 NOTE — Assessment & Plan Note (Signed)
Patient reports decreased hearing in her left ear, as well as a thumping sound that occurs mostly in her left ear, with a thumping sound occurs daily, has been going on for the past few years, is now going on her left side.  She denies any ear drainage, sinus pressure, sinus pain cough, headache, or other symptoms.  On exam she has severely impacted cerumen in both ears, unable to visualize.  Nurse attempted to irrigate in the clinic with minimal output. Placed referral to ENT for cerumen impaction and hearing testing.

## 2020-08-02 NOTE — Assessment & Plan Note (Signed)
Patient reports that she has not been taking her Synthroid because she feels like at weeks ago "crazy", she will get very agitated.Marland Kitchen  Upon anything for the past few months.  She endorses decreased energy, cold intolerance, decreased appetite, loss, dry skin, and muscle pains.  He endorses multiple issues with taking her Synthroid and her last TSH was significantly elevated at 220 due to medication noncompliance.  Had a long discussion of the importance of taking Synthroid and have been significantly overlap with her after complaints.  She has been having this episode of some bilateral leg tremors that also could be coming from untreated hypothyroidism.  We discussed trying a once a week dose of Synthroid to see if she will be able to take that, discussed that this typically will be a higher dose than daily dose.  -Start Synthroid 300 mcg weekly -RTC in 6 weeks to repeat TSH

## 2020-08-02 NOTE — Assessment & Plan Note (Signed)
Patient reports that she is having significant left ankle and left leg pain.  She reports having a broken in 2016 states that her pain has been 10 out of 10 ever since then.  She thinks that it is worse with cold, heat, anything touching it.  She actually feels that the left ankle swelling is better than normal.  Nothing seems to make the pain better.  Feels that it is worsening and now the pain is radiating up to her left hip.  She currently follows with pain management and states she is on Topamax and Lexapro, she does not know the doses.  Also has a history of fibromyalgia. On exam she has a significantly swollen left ankle, tenderness to palpation, surgical scar well-healed, pain with active and passive movement. Discussed that we do not know what medication she is on at this time so cannot make adjustments.  Discussed following up with her pain management clinic for medication adjustment and having them send over records.

## 2020-08-04 NOTE — Progress Notes (Signed)
Internal Medicine Clinic Attending  Case discussed with Dr. Sherry Ruffing  At the time of the visit.  We reviewed the resident's history and exam and pertinent patient test results.  I agree with the assessment, diagnosis, and plan of care documented in the resident's note. As noted adherence to synthroid is the main issue here, it can be dosed once weekly if this will improve adherence.

## 2020-08-08 ENCOUNTER — Telehealth: Payer: Self-pay | Admitting: *Deleted

## 2020-08-08 NOTE — Chronic Care Management (AMB) (Signed)
  Care Management   Outreach Note  08/08/2020 Name: Kayla [name] MRN: [id number] DOB: Oct 23, 1960  Kayla [name] is a 59 y.o. year old female who is a primary care patient of Sherry Ruffing, Adela Lank, MD. I reached out to Laverle Hobby by phone today in response to a referral sent by Ms. [name] PCP, Asencion Noble, MD.     An unsuccessful telephone outreach was attempted today. The patient was referred to the case management team for assistance with care management and care coordination.   Follow Up Plan: A HIPAA compliant phone message was left for the patient providing contact information and requesting a return call. The care management team will reach out to the patient again over the next 7 days. If patient returns call to provider office, please advise to call [place] at [phone].  [facility]  Direct Dial: [phone]

## 2020-08-09 ENCOUNTER — Ambulatory Visit: Payer: Self-pay

## 2020-08-09 NOTE — Chronic Care Management (AMB) (Signed)
  Care Management   Note  08/09/2020 Name: Kayla [name] MRN: [id number] DOB: March 29, 1961  Kayla [name] is a 59 y.o. year old female who is a primary care patient of Sherry Ruffing, Adela Lank, MD. I reached out to Laverle Hobby by phone today in response to a referral sent by Ms. [name] PCP, Asencion Noble, MD. Ms. Elza was given information about care management services today including:  1. Care management services include personalized support from designated clinical staff supervised by her physician, including individualized plan of care and coordination with other care providers 2. 24/7 contact phone numbers for assistance for urgent and routine care needs. 3. The patient may stop care management services at any time by phone call to the office staff.  Patient agreed to services and verbal consent obtained.   Follow up plan: Telephone appointment with care management team member scheduled for: 08/14/2020  [facility]  Direct Dial: [phone]

## 2020-08-14 ENCOUNTER — Ambulatory Visit: Payer: Self-pay

## 2020-08-14 ENCOUNTER — Ambulatory Visit: Payer: Self-pay | Admitting: *Deleted

## 2020-08-14 DIAGNOSIS — E1140 Type 2 diabetes mellitus with diabetic neuropathy, unspecified: Secondary | ICD-10-CM

## 2020-08-14 DIAGNOSIS — IMO0002 Reserved for concepts with insufficient information to code with codable children: Secondary | ICD-10-CM

## 2020-08-14 DIAGNOSIS — N184 Chronic kidney disease, stage 4 (severe): Secondary | ICD-10-CM

## 2020-08-14 DIAGNOSIS — I10 Essential (primary) hypertension: Secondary | ICD-10-CM

## 2020-08-14 DIAGNOSIS — E890 Postprocedural hypothyroidism: Secondary | ICD-10-CM

## 2020-08-14 NOTE — Chronic Care Management (AMB) (Addendum)
Care Management   Initial Visit Note  08/14/2020 Name: Kayla [name] MRN: [id number] DOB: [birth year]  Rigby Swamy is enrolled in a Managed Medicaid plan: No. Outreach attempt today was successful.   Subjective: see care plan  Objective: BP Readings from Last 3 Encounters:  08/02/20 (!) 166/103  03/27/20 (!) 178/86  10/25/19 (!) 174/70   Lab Results  Component Value Date   CHOL 269 (H) 10/21/2016   HDL 71 10/21/2016   LDLCALC 174 (H) 10/21/2016   TRIG 121 10/21/2016   CHOLHDL 3.8 10/21/2016   Wt Readings from Last 3 Encounters:  03/27/20 134 lb 14.4 oz (61.2 kg)  10/25/19 152 lb 8 oz (69.2 kg)  07/22/19 149 lb 11.2 oz (67.9 kg)    Assessment: Kayla [name] is a 59 y.o. year old female who sees Asencion Noble, MD for primary care. The care management team was consulted for assistance with care management and care coordination needs related to Care Coordination.   Review of patient status, including review of consultants reports, relevant laboratory and other test results, and collaboration with appropriate care team members and the patient's provider was performed as part of comprehensive patient evaluation and provision of care management services.    SDOH (Social Determinants of Health) screening performed today. See Care Plan Entry related to challenges with: None  Goals Addressed              This Visit's Progress     Patient Stated   .  Patient asked CCM BSW  "Am I eligible for disabilty or Medicaid if I am receiving Workman's Compensation? Also, I need assistance with transportation." (pt-stated)        Current Barriers:  . Chronic Disease Management support, education, and care coordination needs related to HTN, HLD, and DMII . Unable to independently determine if she is eligible for Medicaid and or disability  Case Manager Clinical Goal(s):  Marland Kitchen Over the next 120 days, patient will work with BSW to address needs related to Financial constraints related to  lack of other health insurance and income and Transportation in patient with HTN, HLD, DMII, and CKD Stage 4 Interventions:  . Collaborated with BSW to initiate plan of care to address needs related to  Financial constraints related to lack of other health insurance and income and Transportation in patient with HTN, HLD, and DMII and CKD Stage 4 Patient Goals/Self-Care Activities . Over the next 120 days, patient will:   - Patient verbalizes understanding of plan to work with CCM BSW to investigate other health insurance options and transportation assistance Self administers medications as prescribed Attends all scheduled provider appointments Calls pharmacy for medication refills Performs ADL's independently Performs IADL's independently Calls provider office for new concerns or questions Follow Up Plan: The care management team will reach out to the patient again over the next 30 days.    Initial Goal Documentation        Follow up plan:  The care management team will reach out to the patient again over the next 30 days.   Ms. Fritzsche was given information about Care Management services today including:  1. Care Management services include personalized support from designated clinical staff supervised by a physician, including individualized plan of care and coordination with other care providers 2. 24/7 contact phone numbers for assistance for urgent and routine care needs. 3. The patient may stop CCM services at any time (effective at the end of the month) by phone call to the office staff.  Patient agreed to services and verbal consent obtained.  Kelli Churn RN, CCM, [facility] RN Care Manager [phone]

## 2020-08-14 NOTE — Chronic Care Management (AMB) (Signed)
Care Management    Clinical Social Work General Note  08/14/2020 Name: Alyda Megna MRN: [id number] DOB: 08-Dec-1960  Elajah Kunsman is a 59 y.o. year old female who is a primary care patient of Asencion Noble, MD. The Care Management Team was consulted to assist the patient with Disease Management and Community Resources.   Ms. Dorough was given information about  Care Management services today including:  1. Care Management service includes personalized support from designated clinical staff supervised by her physician, including individualized plan of care and coordination with other care providers 2. 24/7 contact phone numbers for assistance for urgent and routine care needs. 3. The patient may stop Care Management services at any time (effective at the end of the month) by phone call to the office staff.   Patient agreed to services and verbal consent obtained.   Review of patient status, including review of consultants reports, relevant laboratory and other test results, and collaboration with appropriate care team members and the patient's provider was performed as part of comprehensive patient evaluation and provision of  care management services.    SDOH (Social Determinants of Health) assessments and interventions performed:  Yes SDOH Interventions     Most Recent Value  SDOH Interventions  Food Insecurity Interventions --  Quintella Baton she receives sufficient amount of food stamps]  Transportation Interventions SCAT ([place])       Outpatient Encounter Medications as of 08/14/2020  Medication Sig Note  . amLODipine (NORVASC) 5 MG tablet Take 1 tablet (5 mg total) by mouth daily.   Pleas Patricia Microlet Lancets lancets Check blood a sugar up to 3 times daily   . Blood Glucose Monitoring Suppl (ACCU-CHEK AVIVA PLUS) W/DEVICE KIT 1 kit by Does not apply route as directed.   . ferrous gluconate (FERGON) 324 MG tablet Take 1 tablet (324 mg total) by mouth  daily with breakfast.   . furosemide (LASIX) 20 MG tablet Take 1 tablet (20 mg total) by mouth 2 (two) times daily.   Marland Kitchen gabapentin (NEURONTIN) 600 MG tablet Take 1 tablet (600 mg total) by mouth 3 (three) times daily.   Marland Kitchen glucose blood (CONTOUR NEXT TEST) test strip Test blood glucose 3 times daily   . Insulin Glargine (BASAGLAR KWIKPEN) 100 UNIT/ML SOPN Inject 0.2 mLs (20 Units total) into the skin daily.   Marland Kitchen ketoconazole (NIZORAL) 2 % shampoo Apply 1 application topically 2 (two) times a week. (Patient taking differently: Apply 1 application topically 2 (two) times a week. On skin) 05/10/2019: Patient reports taking 1-2 times monthly for skin  . Lancet Devices (ACCU-CHEK SOFTCLIX) lancets Use as instructed   . lansoprazole (PREVACID) 30 MG capsule Take 1 capsule (30 mg total) by mouth daily.   Marland Kitchen levothyroxine (SYNTHROID) 200 MCG tablet Take 1 tablet (200 mcg total) by mouth once a week.   . lidocaine (LIDODERM) 5 % Place 1 patch onto the skin daily. Remove & Discard patch within 12 hours or as directed by MD   . pantoprazole (PROTONIX) 20 MG tablet Take 1 tablet (20 mg total) by mouth daily.   . sodium bicarbonate 650 MG tablet Take 2 tablets (1,300 mg total) by mouth 3 (three) times daily.   Marland Kitchen topiramate (TOPAMAX) 25 MG capsule Take 1-2 capsules (25-50 mg total) by mouth See admin instructions. Take 25 mg  in the morning, 50 mg at night   . triamcinolone cream (KENALOG) 0.1 % Apply 1 application topically daily.   Marland Kitchen venlafaxine XR (  EFFEXOR XR) 37.5 MG 24 hr capsule Take 1 capsule (37.5 mg total) by mouth daily.   . Vitamin D, Ergocalciferol, (DRISDOL) 50000 units CAPS capsule Take 1 capsule (50,000 Units total) by mouth every 7 (seven) days. 05/13/2019: Have not started yet   No facility-administered encounter medications on file as of 08/14/2020.      Patient Care Plan: Social Work Plan of Care    Problem Identified: Barriers to Treatment/Transportation     Long-Range Goal: Transportation  resources identified   Start Date: 08/14/2020  Expected End Date: 09/25/2020  This Visit's Progress: On track  Priority: Medium  Note:   Current SDOH Barriers:  . Transportation  Clinical Social Work Clinical Goal(s):  Marland Kitchen Over the next 60 days, patient will work with SW to address concerns related to transportation/applying for [place] [place]  Interventions: . Patient interviewed and SDOH assessment performed . Patient interviewed and appropriate assessments performed . Provided patient with information about application process for [place] . Collaborated with RN Case Freight forwarder . Completed/submitted [place] application on patient's behalf  Patient Self Care Activities:  . Patient verbalizes understanding of plan to work with CCM BSW for [place] application      Problem Identified: Barriers to treatment/lack of health insurance   Priority: High    Goal: Determination of eligibilty for health coverage   Start Date: 08/14/2020  Expected End Date: 08/28/2020  This Visit's Progress: On track  Priority: High  Note:   Current SDOH Barriers:  . Patient currently receiving workers compensation.  States she is not eligible for disability income, Medicare, or Medicaid due to this. Lack of health coverage is prohibiting access to appropriate provider care(pain management)  Clinical Social Work Clinical Goal(s):  Marland Kitchen Over the next 14 days, patient will work with SW to address concerns related to lack of insurance  Interventions: . Patient interviewed and SDOH assessment performed . Provided patient with information about Disability Assistance Program . Message sent to Arrie Senate with Disability Assistance Program to inquire if patient is accurate about inability to apply due to receiving Workers Compensation . Agreed to assist with referral to program if response is received that patient can apply  Patient Self Care Activities:  . Self administers medications as  prescribed . Calls provider office for new concerns or questions        Follow Up Plan: SW will follow up with patient by phone over the next 14 days          [place], [place] [phone]

## 2020-08-14 NOTE — Patient Instructions (Signed)
Visit Information Patient Care Plan: Social Work Plan of Care    Problem Identified: Barriers to Treatment/Transportation     Long-Range Goal: Transportation resources identified   Start Date: 08/14/2020  Expected End Date: 09/25/2020  This Visit's Progress: On track  Priority: Medium  Note:   Current SDOH Barriers:  . Transportation  Clinical Social Work Clinical Goal(s):  Marland Kitchen Over the next 60 days, patient will work with SW to address concerns related to transportation/applying for [place]  Interventions: . Patient interviewed and SDOH assessment performed . Patient interviewed and appropriate assessments performed . Provided patient with information about application process for [place] . Collaborated with RN Case Freight forwarder . Completed/submitted [place] application on patient's behalf  Patient Self Care Activities:  . Patient verbalizes understanding of plan to work with CCM BSW for [place] application      Problem Identified: Barriers to treatment/lack of health insurance   Priority: High    Goal: Determination of eligibilty for health coverage   Start Date: 08/14/2020  Expected End Date: 08/28/2020  This Visit's Progress: On track  Priority: High  Note:   Current SDOH Barriers:  . Patient currently receiving workers compensation.  States she is not eligible for disability income, Medicare, or Medicaid due to this. Lack of health coverage is prohibiting access to appropriate provider care(pain management)  Clinical Social Work Clinical Goal(s):  Marland Kitchen Over the next 14 days, patient will work with SW to address concerns related to lack of insurance  Interventions: . Patient interviewed and SDOH assessment performed . Provided patient with information about Disability Assistance Program . Message sent to Arrie Senate with Disability Assistance Program to inquire if patient is accurate about inability to apply due to receiving Workers Compensation . Agreed to  assist with referral to program if response is received that patient can apply  Patient Self Care Activities:  . Self administers medications as prescribed . Calls provider office for new concerns or questions         Ms. Zook was given information about Care Management services today including:  1. Care Management services include personalized support from designated clinical staff supervised by her physician, including individualized plan of care and coordination with other care providers 2. 24/7 contact phone numbers for assistance for urgent and routine care needs. 3. The patient may stop CCM services at any time (effective at the end of the month) by phone call to the office staff.  Patient agreed to services and verbal consent obtained.   The patient verbalized understanding of instructions, educational materials, and care plan provided today and declined offer to receive copy of patient instructions, educational materials, and care plan.   The patient has been provided with contact information for the care management team and has been advised to call with any health related questions or concerns.     Ronn Melena, [facility] Coordination Social Worker [place] [phone]

## 2020-08-14 NOTE — Patient Instructions (Signed)
Visit Information  Goals Addressed              This Visit's Progress     Patient Stated   .  Patient asked CCM BSW  "Am I eligible for disabilty or Medicaid if I am receiving Workman's Compensation?" (pt-stated)        Current Barriers:  . Chronic Disease Management support, education, and care coordination needs related to HTN, HLD, and DMII . Unable to independently determine if she is eligible for Medicaid and or disability  Case Manager Clinical Goal(s):  Marland Kitchen Over the next 120 days, patient will work with BSW to address needs related to Financial constraints related to lack of health insurance  in patient with HTN, HLD, and DMII Interventions:  . Collaborated with BSW to initiate plan of care to address needs related to Financial constraints related to lack of health insurance  in patient with HTN, HLD, and DMII Patient Goals/Self-Care Activities . Over the next 120 days, patient will:   - Patient verbalizes understanding of plan to work with CCM BSW to investigate other health insurance options Self administers medications as prescribed Attends all scheduled provider appointments Calls pharmacy for medication refills Performs ADL's independently Performs IADL's independently Calls provider office for new concerns or questions Follow Up Plan: The care management team will reach out to the patient again over the next 30 days.    Initial Goal Documentation       [name] was given information about Care Management services today including:  1. Care Management services include personalized support from designated clinical staff supervised by her physician, including individualized plan of care and coordination with other care providers 2. 24/7 contact phone numbers for assistance for urgent and routine care needs. 3. The patient may stop CCM services at any time (effective at the end of the month) by phone call to the office staff.  Patient agreed to services and verbal  consent obtained.   The patient verbalized understanding of instructions, educational materials, and care plan provided today and agreed to receive a mailed copy of patient instructions, educational materials, and care plan.   The care management team will reach out to the patient again over the next 30 days.   Kelli Churn RN, CCM, [facility] RN Care Manager [phone]

## 2020-08-15 ENCOUNTER — Ambulatory Visit: Payer: Self-pay

## 2020-08-15 NOTE — Chronic Care Management (AMB) (Signed)
Encounter opened in error

## 2020-08-16 NOTE — Progress Notes (Signed)
Internal Medicine Clinic Resident  I have personally reviewed this encounter including the documentation in this note and/or discussed this patient with the care management provider. I will address any urgent items identified by the care management provider and will communicate my actions to the patient's PCP. I have reviewed the patient's CCM visit with my supervising attending, Dr Vincent.  Diva Lemberger, MD 08/16/2020  

## 2020-08-16 NOTE — Progress Notes (Signed)
Internal Medicine Clinic Resident  I have personally reviewed this encounter including the documentation in this note and/or discussed this patient with the care management provider. I will address any urgent items identified by the care management provider and will communicate my actions to the patient's PCP. I have reviewed the patient's CCM visit with my supervising attending, Dr Vincent.  Nycholas Rayner, MD 08/16/2020  

## 2020-08-16 NOTE — Progress Notes (Signed)
Internal Medicine Clinic Attending  CCM services provided by the care management provider and their documentation were discussed with Dr. Basaraba. We reviewed the pertinent findings, urgent action items addressed by the resident and non-urgent items to be addressed by the PCP.  I agree with the assessment, diagnosis, and plan of care documented in the CCM and resident's note.  Hava Massingale Thomas Kellis Topete, MD 08/16/2020  

## 2020-08-16 NOTE — Progress Notes (Signed)
Internal Medicine Clinic Attending  CCM services provided by the care management provider and their documentation were discussed with Dr. Basaraba. We reviewed the pertinent findings, urgent action items addressed by the resident and non-urgent items to be addressed by the PCP.  I agree with the assessment, diagnosis, and plan of care documented in the CCM and resident's note.  Jayesh Marbach Thomas Marthann Abshier, MD 08/16/2020  

## 2020-08-28 ENCOUNTER — Telehealth: Payer: Self-pay | Admitting: *Deleted

## 2020-08-28 ENCOUNTER — Telehealth: Payer: Self-pay

## 2020-08-28 NOTE — Chronic Care Management (AMB) (Signed)
  Care Management   Note  08/28/2020 Name: Kayla [name] MRN: [id number] DOB: 05/28/1961  Kayla [name] is a 59 y.o. year old female who is a primary care patient of Asencion Noble, MD and is actively engaged with the care management team. I reached out to Laverle Hobby by phone today to assist with re-scheduling an initial visit with the RN Case Manager  Follow up plan: Unsuccessful telephone outreach attempt made. A HIPAA compliant phone message was left for the patient providing contact information and requesting a return call.  The care management team will reach out to the patient again over the next 7 days.  If patient returns call to provider office, please advise to call [facility] at [phone].  [facility]

## 2020-08-30 ENCOUNTER — Ambulatory Visit: Payer: Self-pay

## 2020-08-30 DIAGNOSIS — IMO0002 Reserved for concepts with insufficient information to code with codable children: Secondary | ICD-10-CM

## 2020-08-30 DIAGNOSIS — I10 Essential (primary) hypertension: Secondary | ICD-10-CM

## 2020-08-30 DIAGNOSIS — N184 Chronic kidney disease, stage 4 (severe): Secondary | ICD-10-CM

## 2020-08-30 DIAGNOSIS — E1140 Type 2 diabetes mellitus with diabetic neuropathy, unspecified: Secondary | ICD-10-CM

## 2020-08-31 NOTE — Patient Instructions (Signed)
Visit Information  Patient Care Plan: Social Work Plan of Care    Problem Identified: Barriers to Treatment/Transportation     Long-Range Goal: Transportation resources identified   Start Date: 08/14/2020  Expected End Date: 09/25/2020  Recent Progress: On track  Priority: Medium  Note:   Current SDOH Barriers:  . Transportation  Clinical Social Work Clinical Goal(s):  Marland Kitchen Over the next 60 days, patient will work with SW to address concerns related to transportation/applying for [place] [place]  Interventions: Marland Kitchen Message sent to Almedia Balls with [employer] [place] requesting status of application  . Received response that application is still pending .  Marland Kitchen Patient Self Care Activities:  . Patient verbalizes understanding of plan to work with CCM BSW for [employer] [place] application      Problem Identified: Barriers to treatment/lack of health insurance   Priority: High    Goal: Determination of eligibilty for health coverage   Start Date: 08/14/2020  Expected End Date: 08/28/2020  Recent Progress: On track  Priority: High  Note:   Current SDOH Barriers:  . Patient currently receiving workers compensation.  States she is not eligible for disability income, Medicare, or Medicaid due to this. Lack of health coverage is prohibiting access to appropriate provider care(pain management)  Clinical Social Work Clinical Goal(s):  Marland Kitchen Over the next 14 days, patient will work with SW to address concerns related to lack of insurance  Interventions: . Received response from Arrie Senate with Disability Assistance Program regarding patient's possible eligibility for SSDI.  Marland Kitchen Attempted to contact patient for follow up but had to leave message  Patient Self Care Activities:  . Self administers medications as prescribed . Calls provider office for new concerns or questions           A HIPPA compliant phone message was left for the patient providing contact information and requesting a return  call. Will attempt to reach again within 7-10 business days if no return call.       Ronn Melena, [place] Coordination Social Worker [place] [phone]

## 2020-08-31 NOTE — Progress Notes (Signed)
Internal Medicine Clinic Resident  I have personally reviewed this encounter including the documentation in this note and/or discussed this patient with the care management provider. I will address any urgent items identified by the care management provider and will communicate my actions to the patient's PCP. I have reviewed the patient's CCM visit with my supervising attending, Dr Raines.  Maryam Feely K Dalphine Cowie, MD 08/31/2020   

## 2020-08-31 NOTE — Chronic Care Management (AMB) (Signed)
  Care Management   Follow Up Note   08/31/2020 Name: Kayla [name] MRN: [id number] DOB: 09-08-1961  [place] is enrolled in a Managed Medicaid plan: No. Outreach attempt today was unsuccessful.    Referred by: Asencion Noble, MD Reason for referral : Care Coordination (transportation, insurance coverage, disability income)   Kayla [name] is a 59 y.o. year old female who is a primary care patient of Sherry Ruffing, Adela Lank, MD. The care management team was consulted for assistance with care management and care coordination needs.    Review of patient status, including review of consultants reports, relevant laboratory and other test results, and collaboration with appropriate care team members and the patient's provider was performed as part of comprehensive patient evaluation and provision of chronic care management services.    Patient Care Plan: Social Work Plan of Care    Problem Identified: Barriers to Treatment/Transportation     Long-Range Goal: Transportation resources identified   Start Date: 08/14/2020  Expected End Date: 09/25/2020  Recent Progress: On track  Priority: Medium  Note:   Current SDOH Barriers:  . Transportation  Clinical Social Work Clinical Goal(s):  Marland Kitchen Over the next 60 days, patient will work with SW to address concerns related to transportation/applying for [place]  Interventions: Marland Kitchen Message sent to Almedia Balls with [employer] [place] requesting status of application  . Received response that application is still pending .  Marland Kitchen Patient Self Care Activities:  . Patient verbalizes understanding of plan to work with CCM BSW for [employer] [place] application      Problem Identified: Barriers to treatment/lack of health insurance   Priority: High    Goal: Determination of eligibilty for health coverage   Start Date: 08/14/2020  Expected End Date: 08/28/2020  Recent Progress: On track  Priority: High  Note:   Current SDOH Barriers:  . Patient currently  receiving workers compensation.  States she is not eligible for disability income, Medicare, or Medicaid due to this. Lack of health coverage is prohibiting access to appropriate provider care(pain management)  Clinical Social Work Clinical Goal(s):  Marland Kitchen Over the next 14 days, patient will work with SW to address concerns related to lack of insurance  Interventions: . Received response from Arrie Senate with Disability Assistance Program regarding patient's possible eligibility for SSDI.  Marland Kitchen Attempted to contact patient for follow up but had to leave message  Patient Self Care Activities:  . Self administers medications as prescribed . Calls provider office for new concerns or questions        A HIPPA compliant phone message was left for the patient providing contact information and requesting a return call. Will attempt to reach again within 7-10 business days if no return call.       Kayla [name], [place] Coordination Social Worker [place] [phone]

## 2020-09-01 ENCOUNTER — Encounter: Payer: Self-pay | Admitting: Internal Medicine

## 2020-09-01 NOTE — Chronic Care Management (AMB) (Signed)
  Care Management   Note  09/01/2020 Name: Kayla [name] MRN: [id number] DOB: 05-02-1961  Kayla [name] is a 59 y.o. year old female who is a primary care patient of Asencion Noble, MD and is actively engaged with the care management team. I reached out to Laverle Hobby by phone today to assist with re-scheduling an initial visit with the RN Case Manager.  Follow up plan: Unsuccessful telephone outreach attempt made. A HIPAA compliant phone message was left for the patient providing contact information and requesting a return call.  The care management team will reach out to the patient again over the next 7 days.  If patient returns call to provider office, please advise to call [place] at [phone].  [place] Management

## 2020-09-05 NOTE — Chronic Care Management (AMB) (Signed)
  Care Management   Note  09/05/2020 Name: Arnett Galindez MRN: [id number] DOB: 1961/02/17  Alaynah Schutter is a 59 y.o. year old female who is a primary care patient of Asencion Noble, MD and is actively engaged with the care management team. I reached out to Laverle Hobby by phone today to assist with re-scheduling an initial visit with the RN Case Manager  Follow up plan: Telephone appointment with care management team member scheduled for:09/12/2020  [place] Management

## 2020-09-11 ENCOUNTER — Telehealth: Payer: Self-pay

## 2020-09-11 NOTE — Telephone Encounter (Signed)
  Chronic Care Management   Outreach Note  09/11/2020 Name: Kayla [name] MRN: [id number] DOB: 1961/07/18  Referred by: Asencion Noble, MD Reason for referral : [place] is enrolled in a Managed Medicaid Health Plan: No  A second unsuccessful telephone outreach was attempted today. The patient was referred to the case management team for assistance with care management and care coordination.   Follow Up Plan: A HIPAA compliant phone message was left for the patient providing contact information and requesting a return call.      Ronn Melena, [place] Coordination Social Worker [place] [phone]

## 2020-09-12 ENCOUNTER — Telehealth: Payer: Self-pay

## 2020-09-12 ENCOUNTER — Telehealth: Payer: Self-pay | Admitting: *Deleted

## 2020-09-12 NOTE — Telephone Encounter (Signed)
  Care Management   Outreach Note  09/12/2020 Name: Kayla [name] MRN: [id number] DOB: [birth year]  Referred by: Asencion Noble, MD Reason for referral : Care Coordination (IDDM, hypothyroidisms/p radio iodine Rx, HTN, HLD, CKD stage 4, PUD)   Kayla [name] is enrolled in a Managed Medicaid Health Plan: No  An unsuccessful telephone outreach was attempted today. The patient was referred to the case management team for assistance with care management and care coordination.   Follow Up Plan: A HIPAA compliant phone message was left for the patient providing contact information and requesting a return call.  The care management team will reach out to the patient again over the next 7-14 days.   Kelli Churn RN, CCM, [facility] RN Care Manager [phone]

## 2020-09-13 ENCOUNTER — Ambulatory Visit: Payer: Self-pay | Admitting: *Deleted

## 2020-09-13 ENCOUNTER — Ambulatory Visit: Payer: Self-pay | Admitting: Internal Medicine

## 2020-09-13 ENCOUNTER — Other Ambulatory Visit: Payer: Self-pay

## 2020-09-13 ENCOUNTER — Telehealth: Payer: Self-pay

## 2020-09-13 DIAGNOSIS — E890 Postprocedural hypothyroidism: Secondary | ICD-10-CM

## 2020-09-13 DIAGNOSIS — N184 Chronic kidney disease, stage 4 (severe): Secondary | ICD-10-CM

## 2020-09-13 DIAGNOSIS — IMO0002 Reserved for concepts with insufficient information to code with codable children: Secondary | ICD-10-CM

## 2020-09-13 DIAGNOSIS — I10 Essential (primary) hypertension: Secondary | ICD-10-CM

## 2020-09-13 NOTE — Chronic Care Management (AMB) (Addendum)
  Care Management   Note  09/13/2020 Name: Kayla [name] MRN: [id number] DOB: 01-05-1961  Kayla [name] is enrolled in a Managed Medicaid plan: No. Outreach attempt today was successful.   Patient returned call to this CCM RN in response to voice message left yesterday. Patient states she is unaware she had clinic appointment scheduled for this afternoon with both MD and this CCM RN for initial CCM intake; she says she will have to reschedule.    With patient's agreement,  initial telephone assessment rescheduled for 09/26/20 at 2:00 pm.    Kelli Churn RN, CCM, [facility] RN Care Manager [phone]

## 2020-09-14 ENCOUNTER — Ambulatory Visit: Payer: Self-pay

## 2020-09-14 DIAGNOSIS — E1140 Type 2 diabetes mellitus with diabetic neuropathy, unspecified: Secondary | ICD-10-CM

## 2020-09-14 DIAGNOSIS — IMO0002 Reserved for concepts with insufficient information to code with codable children: Secondary | ICD-10-CM

## 2020-09-14 DIAGNOSIS — N184 Chronic kidney disease, stage 4 (severe): Secondary | ICD-10-CM

## 2020-09-14 DIAGNOSIS — I10 Essential (primary) hypertension: Secondary | ICD-10-CM

## 2020-09-14 NOTE — Chronic Care Management (AMB) (Signed)
  Care Management   Follow Up Note   09/14/2020 Name: Kayla [name] MRN: [id number] DOB: 03-Dec-1960  Kayla [name] is enrolled in a Managed Medicaid plan: No. Outreach attempt today was unsuccessful.    Referred by: Asencion Noble, MD Reason for referral : Care Coordination   Kayla [name] is a 59 y.o. year old female who is a primary care patient of Sherry Ruffing, Adela Lank, MD. The care management team was consulted for assistance with care management and care coordination needs.    Review of patient status, including review of consultants reports, relevant laboratory and other test results, and collaboration with appropriate care team members and the patient's provider was performed as part of comprehensive patient evaluation and provision of chronic care management services.    Patient Care Plan: Social Work Plan of Care    Problem Identified: Barriers to Treatment/Transportation     Long-Range Goal: Transportation resources identified Completed 09/14/2020  Start Date: 08/14/2020  Expected End Date: 09/25/2020  This Visit's Progress: On track  Recent Progress: On track  Priority: Medium  Note:   Current SDOH Barriers:  . Transportation  Clinical Social Work Clinical Goal(s):  Kayla [name] Kitchen Over the next 60 days, patient will work with SW to address concerns related to transportation/applying for [place] [place]  Interventions: Kayla [name] Kitchen Message sent to Almedia Balls with [employer] [place] requesting status of application  . Received response that patient was approved for service and certification packet was mailed  .  .  . Patient Self Care Activities:  . Patient verbalizes understanding of plan to work with CCM BSW for [employer] [place] application      Problem Identified: Barriers to treatment/lack of health insurance   Priority: High    Goal: Determination of eligibilty for health coverage   Start Date: 08/14/2020  Expected End Date: 08/28/2020  Recent Progress: On track  Priority: High  Note:    Current SDOH Barriers:  . Patient currently receiving workers compensation.  States she is not eligible for disability income, Medicare, or Medicaid due to this. Lack of health coverage is prohibiting access to appropriate provider care(pain management)  Clinical Social Work Clinical Goal(s):  Kayla [name] Kitchen Over the next 14 days, patient will work with SW to address concerns related to lack of insurance  Interventions: . Received response from Arrie Senate with Disability Assistance Program regarding patient's possible eligibility for SSDI.  Kayla [name] Kitchen Attempted to contact patient for follow up but had to leave message  Patient Self Care Activities:  . Self administers medications as prescribed . Calls provider office for new concerns or questions         Third unsuccessful attempt to follow up with patient via phone today. Received message that "call cannot be completed as dialed"  Patient scheduled for RN assessment on 09/26/20.  Requested that she have patient contact CCM BSW if able to connect with her.    Ronn Melena, [place] Coordination Social Worker [place] [phone]

## 2020-09-14 NOTE — Patient Instructions (Signed)
Patient Care Plan: Social Work Plan of Care    Problem Identified: Barriers to Treatment/Transportation     Long-Range Goal: Transportation resources identified Completed 09/14/2020  Start Date: 08/14/2020  Expected End Date: 09/25/2020  This Visit's Progress: On track  Recent Progress: On track  Priority: Medium  Note:   Current SDOH Barriers:  . Transportation  Clinical Social Work Clinical Goal(s):  Marland Kitchen Over the next 60 days, patient will work with SW to address concerns related to transportation/applying for [place]  Interventions: Marland Kitchen Message sent to Almedia Balls with [employer] [place] requesting status of application  . Received response that patient was approved for service and certification packet was mailed  .  .  . Patient Self Care Activities:  . Patient verbalizes understanding of plan to work with CCM BSW for [employer] [place] application      Problem Identified: Barriers to treatment/lack of health insurance   Priority: High    Goal: Determination of eligibilty for health coverage   Start Date: 08/14/2020  Expected End Date: 08/28/2020  Recent Progress: On track  Priority: High  Note:   Current SDOH Barriers:  . Patient currently receiving workers compensation.  States she is not eligible for disability income, Medicare, or Medicaid due to this. Lack of health coverage is prohibiting access to appropriate provider care(pain management)  Clinical Social Work Clinical Goal(s):  Marland Kitchen Over the next 14 days, patient will work with SW to address concerns related to lack of insurance  Interventions: . Received response from Arrie Senate with Disability Assistance Program regarding patient's possible eligibility for SSDI.  Marland Kitchen Attempted to contact patient for follow up but had to leave message  Patient Self Care Activities:  . Self administers medications as prescribed . Calls provider office for new concerns or questions        Third unsuccessful attempt to follow up  with patient via phone today. Received message that "call cannot be completed as dialed"  Patient scheduled for RN assessment on 09/26/20.  Requested that she have patient contact CCM BSW if able to connect with her.    Ronn Melena, [place] Coordination Social Worker [place] [phone]

## 2020-09-14 NOTE — Progress Notes (Signed)
Internal Medicine Clinic Attending  CCM services provided by the care management provider and their documentation were reviewed with Dr. Agyei.  We reviewed the pertinent findings, urgent action items addressed by the resident and non-urgent items to be addressed by the PCP.  I agree with the assessment, diagnosis, and plan of care documented in the CCM and resident's note.  Tula Schryver N Jacody Beneke, MD 09/14/2020  

## 2020-09-15 NOTE — Progress Notes (Signed)
Internal Medicine Clinic Resident  I have personally reviewed this encounter including the documentation in this note and/or discussed this patient with the care management provider. I will address any urgent items identified by the care management provider and will communicate my actions to the patient's PCP. I have reviewed the patient's CCM visit with my supervising attending, Dr Rebeca Alert.  [place]and[place] Lodge, MD 09/15/2020

## 2020-09-18 NOTE — Progress Notes (Signed)
Internal Medicine Clinic Attending  CCM services provided by the care management provider and their documentation were reviewed with Dr. Shon Baton.  We reviewed the pertinent findings, urgent action items addressed by the resident and non-urgent items to be addressed by the PCP.  I agree with the assessment, diagnosis, and plan of care documented in the CCM and resident's note.  Oda Kilts, MD 09/18/2020

## 2020-09-26 ENCOUNTER — Telehealth: Payer: Self-pay | Admitting: *Deleted

## 2020-09-26 ENCOUNTER — Telehealth: Payer: Self-pay

## 2020-09-26 NOTE — Telephone Encounter (Signed)
  Care Management   Outreach Note  09/26/2020 Name: Kayla [name] MRN: [id number] DOB: 05-Mar-1961  Referred by: Asencion Noble, MD Reason for referral : Care Coordination (IDDM, hypothyroidisms/p radio iodine Rx, HTN, HLD, CKD stage 4, PUD)   Kayla [name] is enrolled in a Managed Medicaid Health Plan: No  A second unsuccessful telephone outreach was attempted today. Initial telephone intake was scheduled per patient request for today at 2:00 pm. The patient was referred to the case management team for assistance with care management and care coordination.   Follow Up Plan: The care management team will reach out to the patient again over the next 7-14 days.   Kelli Churn RN, CCM, [facility] RN Care Manager [phone]

## 2020-09-27 ENCOUNTER — Ambulatory Visit: Payer: Self-pay

## 2020-09-27 DIAGNOSIS — I10 Essential (primary) hypertension: Secondary | ICD-10-CM

## 2020-09-27 DIAGNOSIS — E1140 Type 2 diabetes mellitus with diabetic neuropathy, unspecified: Secondary | ICD-10-CM

## 2020-09-27 DIAGNOSIS — IMO0002 Reserved for concepts with insufficient information to code with codable children: Secondary | ICD-10-CM

## 2020-09-27 DIAGNOSIS — N184 Chronic kidney disease, stage 4 (severe): Secondary | ICD-10-CM

## 2020-09-27 NOTE — Chronic Care Management (AMB) (Deleted)
Care Management   Follow Up Note   09/27/2020 Name: Kayla [name] MRN: [id number] DOB: 1961/02/22  Kayla [name] is enrolled in a Managed Medicaid plan: No. Outreach attempt today was unsuccessful.    Referred by: Asencion Noble, MD Reason for referral : Care Coordination   Kayla [name] is a 59 y.o. year old female who is a primary care patient of Sherry Ruffing, Adela Lank, MD. The care management team was consulted for assistance with care management and care coordination needs.    Review of patient status, including review of consultants reports, relevant laboratory and other test results, and collaboration with appropriate care team members and the patient's provider was performed as part of comprehensive patient evaluation and provision of chronic care management services.    Goals Addressed              This Visit's Progress   .  COMPLETED: Patient asked CCM BSW  "Am I eligible for disabilty or Medicaid if I am receiving Workman's Compensation? Also, I need assistance with transportation." (pt-stated)        Current Barriers:  . Chronic Disease Management support, education, and care coordination needs related to HTN, HLD, and DMII . Unable to independently determine if she is eligible for Medicaid and or disability  Case Manager Clinical Goal(s):  Marland Kitchen Over the next 120 days, patient will work with BSW to address needs related to Financial constraints related to lack of other health insurance and income and Transportation in patient with HTN, HLD, DMII, and CKD Stage 4 Interventions:  . SW Goal closed due to inability to maintain contact with patient.  Patient Goals/Self-Care Activities . Over the next 120 days, patient will:   - Patient verbalizes understanding of plan to work with CCM BSW to investigate other health insurance options and transportation assistance Self administers medications as prescribed Attends all scheduled provider appointments Calls pharmacy for  medication refills Performs ADL's independently Performs IADL's independently Calls provider office for new concerns or questions Follow Up Plan: The care management team will reach out to the patient again over the next 30 days.           Patient Care Plan: Social Work Plan of Care  Completed 09/27/2020  Problem Identified: Barriers to Treatment/Transportation Resolved 09/27/2020    Long-Range Goal: Transportation resources identified Completed 09/14/2020  Start Date: 08/14/2020  Expected End Date: 09/25/2020  This Visit's Progress: On track  Recent Progress: On track  Priority: Medium  Note:   Current SDOH Barriers:  . Transportation  Clinical Social Work Clinical Goal(s):  Marland Kitchen Over the next 60 days, patient will work with SW to address concerns related to transportation/applying for [place]  Interventions: Marland Kitchen Message sent to Almedia Balls with [employer] [place] requesting status of application  . Received response that patient was approved for service and certification packet was mailed  .  .  . Patient Self Care Activities:  . Patient verbalizes understanding of plan to work with CCM BSW for [employer] [place] application      Problem Identified: Barriers to treatment/lack of health insurance Resolved 09/27/2020  Priority: High    Goal: Determination of eligibilty for health coverage Completed 09/27/2020  Start Date: 08/14/2020  Expected End Date: 08/28/2020  This Visit's Progress: Not on track  Recent Progress: On track  Priority: High  Note:   Current SDOH Barriers:  . Patient currently receiving workers compensation.  States she is not eligible for disability income, Medicare, or Medicaid due to this.  Lack of health coverage is prohibiting access to appropriate provider care(pain management)  Clinical Social Work Clinical Goal(s):  Marland Kitchen Over the next 14 days, patient will work with SW to address concerns related to lack of insurance  Interventions: . Closing SW care plan  due to inability to maintain contact  Patient Self Care Activities:  . Self administers medications as prescribed . Calls provider office for new concerns or questions       RNCM final attempt to contact patient for initial assessment is scheduled for 10/03/20      Ronn Melena, [place] Coordination Social Worker [place] [phone]

## 2020-09-27 NOTE — Patient Instructions (Signed)
Visit Information  Patient Care Plan: Social Work Plan of Care  Completed 09/27/2020  Problem Identified: Barriers to Treatment/Transportation Resolved 09/27/2020    Long-Range Goal: Transportation resources identified Completed 09/14/2020  Start Date: 08/14/2020  Expected End Date: 09/25/2020  This Visit's Progress: not on track  Recent Progress: On track  Priority: Medium  Note:   Current SDOH Barriers:  . Transportation  Clinical Social Work Clinical Goal(s):  Marland Kitchen Over the next 60 days, patient will work with SW to address concerns related to transportation/applying for [place]  Interventions: . Care plan/goal closure due to inability to maintain contact with patient .  .  . Patient Self Care Activities:  . Patient verbalizes understanding of plan to work with CCM BSW for [employer] [place] application      Problem Identified: Barriers to treatment/lack of health insurance Resolved 09/27/2020  Priority: High    Goal: Determination of eligibilty for health coverage Completed 09/27/2020  Start Date: 08/14/2020  Expected End Date: 08/28/2020  This Visit's Progress: Not on track  Recent Progress: On track  Priority: High  Note:   Current SDOH Barriers:  . Patient currently receiving workers compensation.  States she is not eligible for disability income, Medicare, or Medicaid due to this. Lack of health coverage is prohibiting access to appropriate provider care(pain management)  Clinical Social Work Clinical Goal(s):  Marland Kitchen Over the next 14 days, patient will work with SW to address concerns related to lack of insurance  Interventions: . Closing SW care plan due to inability to maintain contact  Patient Self Care Activities:  . Self administers medications as prescribed . Calls provider office for new concerns or questions        RNCM final attempt to reach patient for initial assessment scheduled for 10/03/20      Ronn Melena, [place] Coordination Social  Worker [place] [phone]

## 2020-09-27 NOTE — Chronic Care Management (AMB) (Signed)
Care Management   Follow Up Note   09/27/2020 Name: Kayla [name] MRN: [id number] DOB: 1961/05/08  Kayla [name] is enrolled in a Managed Medicaid plan: No. Outreach attempt today N/A   Referred by: Asencion Noble, MD Reason for referral : Care Coordination   Kayla [name] is a 59 y.o. year old female who is a primary care patient of Asencion Noble, MD. The care management team was consulted for assistance with care management and care coordination needs.    Review of patient status, including review of consultants reports, relevant laboratory and other test results, and collaboration with appropriate care team members and the patient's provider was performed as part of comprehensive patient evaluation and provision of chronic care management services.    Goals Addressed              This Visit's Progress   .  COMPLETED: Develop chronic pain plan        -anticipate follow up from CCM team regarding possible pain management referrals/assistance      .  COMPLETED: Disability and/or insurance eligibility          -anticipate follow up call from CCM BSW regarding eligibility for disability and/or insurance  - follow-up on any referrals for help I am given        .  COMPLETED: Patient asked CCM BSW  "Am I eligible for disabilty or Medicaid if I am receiving Workman's Compensation? Also, I need assistance with transportation." (pt-stated)        Current Barriers:  . Chronic Disease Management support, education, and care coordination needs related to HTN, HLD, and DMII . Unable to independently determine if she is eligible for Medicaid and or disability  Case Manager Clinical Goal(s):  Marland Kitchen Over the next 120 days, patient will work with BSW to address needs related to Financial constraints related to lack of other health insurance and income and Transportation in patient with HTN, HLD, DMII, and CKD Stage 4 Interventions:  . SW Goal closed due to inability to maintain  contact with patient.  Patient Goals/Self-Care Activities . Over the next 120 days, patient will:   - Patient verbalizes understanding of plan to work with CCM BSW to investigate other health insurance options and transportation assistance Self administers medications as prescribed Attends all scheduled provider appointments Calls pharmacy for medication refills Performs ADL's independently Performs IADL's independently Calls provider office for new concerns or questions Follow Up Plan: The care management team will reach out to the patient again over the next 30 days.           Patient Care Plan: Social Work Plan of Care  Completed 09/27/2020  Problem Identified: Barriers to Treatment/Transportation Resolved 09/27/2020    Long-Range Goal: Transportation resources identified Completed 09/14/2020  Start Date: 08/14/2020  Expected End Date: 09/25/2020  This Visit's Progress: Not on track  Recent Progress: On track  Priority: Medium  Note:   Current SDOH Barriers:  . Transportation  Clinical Social Work Clinical Goal(s):  Marland Kitchen Over the next 60 days, patient will work with SW to address concerns related to transportation/applying for [place] [place]  Interventions: . Care plan/goal closure due to inability to maintain contact with patient .  .  . Patient Self Care Activities:  . Patient verbalizes understanding of plan to work with CCM BSW for [employer] [place] application      Problem Identified: Barriers to treatment/lack of health insurance Resolved 09/27/2020  Priority: High    Goal: Determination of  eligibilty for health coverage Completed 09/27/2020  Start Date: 08/14/2020  Expected End Date: 08/28/2020  This Visit's Progress: Not on track  Recent Progress: On track  Priority: High  Note:   Current SDOH Barriers:  . Patient currently receiving workers compensation.  States she is not eligible for disability income, Medicare, or Medicaid due to this. Lack of health coverage is  prohibiting access to appropriate provider care(pain management)  Clinical Social Work Clinical Goal(s):  Marland Kitchen Over the next 14 days, patient will work with SW to address concerns related to lack of insurance  Interventions: . Closing SW care plan due to inability to maintain contact  Patient Self Care Activities:  . Self administers medications as prescribed . Calls provider office for new concerns or questions       RNCM scheduled for final attempt to reach patient for initial assessment on 10/03/20      Ronn Melena, [place] Worker [place] [phone]

## 2020-10-02 NOTE — Progress Notes (Signed)
Internal Medicine Clinic Attending  CCM services provided by the care management provider and their documentation were discussed with Dr. Koleen Distance. We reviewed the pertinent findings, urgent action items addressed by the resident and non-urgent items to be addressed by the PCP.  I agree with the assessment, diagnosis, and plan of care documented in the CCM and resident's note.  Velna Ochs, MD 10/02/2020

## 2020-10-02 NOTE — Progress Notes (Signed)
Internal Medicine Clinic Resident  I have personally reviewed this encounter including the documentation in this note and/or discussed this patient with the care management provider. I will address any urgent items identified by the care management provider and will communicate my actions to the patient's PCP. I have reviewed the patient's CCM visit with my supervising attending, Dr Philipp Ovens.  Delice Bison, DO 10/02/2020

## 2020-10-03 ENCOUNTER — Ambulatory Visit: Payer: Self-pay | Admitting: *Deleted

## 2020-10-03 ENCOUNTER — Telehealth: Payer: Self-pay

## 2020-10-03 DIAGNOSIS — E1140 Type 2 diabetes mellitus with diabetic neuropathy, unspecified: Secondary | ICD-10-CM

## 2020-10-03 DIAGNOSIS — I10 Essential (primary) hypertension: Secondary | ICD-10-CM

## 2020-10-03 DIAGNOSIS — N184 Chronic kidney disease, stage 4 (severe): Secondary | ICD-10-CM

## 2020-10-03 DIAGNOSIS — E890 Postprocedural hypothyroidism: Secondary | ICD-10-CM

## 2020-10-03 DIAGNOSIS — IMO0002 Reserved for concepts with insufficient information to code with codable children: Secondary | ICD-10-CM

## 2020-10-03 NOTE — Addendum Note (Signed)
Addended by: Hulan Fray on: 10/03/2020 05:35 PM   Modules accepted: Orders

## 2020-10-03 NOTE — Chronic Care Management (AMB) (Signed)
  Care Management   Outreach Note  10/03/2020 Name: Kayla [name] MRN: [id number] DOB: 09-20-1961  Referred by: Asencion Noble, MD Reason for referral : Care Coordination ( IDDM, hypothyroidism s/p radio iodine Rx, HTN, HLD, CKD stage 4, PUD)   Kayla [name] is enrolled in a Managed Medicaid Health Plan: No  Third unsuccessful telephone outreach was attempted today. The patient was referred to the case management team for assistance with care management and care coordination. The patient's primary care provider has been notified of our unsuccessful attempts to make or maintain contact with the patient. The care management team is pleased to engage with this patient at any time in the future should he/she be interested in assistance from the care management team.   Follow Up Plan: No further follow up required: unable to reach pt to complete initial assessment.   Kelli Churn RN, CCM, [facility] RN Care Manager [phone]

## 2020-10-24 ENCOUNTER — Ambulatory Visit: Payer: Self-pay | Admitting: *Deleted

## 2020-10-24 ENCOUNTER — Ambulatory Visit: Payer: Self-pay | Admitting: Behavioral Health

## 2020-10-24 ENCOUNTER — Other Ambulatory Visit: Payer: Self-pay

## 2020-10-24 DIAGNOSIS — I10 Essential (primary) hypertension: Secondary | ICD-10-CM

## 2020-10-24 DIAGNOSIS — E1140 Type 2 diabetes mellitus with diabetic neuropathy, unspecified: Secondary | ICD-10-CM

## 2020-10-24 DIAGNOSIS — IMO0002 Reserved for concepts with insufficient information to code with codable children: Secondary | ICD-10-CM

## 2020-10-24 DIAGNOSIS — F332 Major depressive disorder, recurrent severe without psychotic features: Secondary | ICD-10-CM

## 2020-10-24 DIAGNOSIS — N184 Chronic kidney disease, stage 4 (severe): Secondary | ICD-10-CM

## 2020-10-24 DIAGNOSIS — E890 Postprocedural hypothyroidism: Secondary | ICD-10-CM

## 2020-10-24 NOTE — BH Specialist Note (Signed)
Integrated Behavioral Health via Telemedicine Visit  10/24/2020 Kayla Durazo MA:[id number]  Number of [place] visits: 1/6 Session Start time: 10:15am  Session End time: 11:00am Total time: 33   Referring Provider: Regino Schultze, RN; emergent mental health call-Pt stating she wants to die. Patient/Family location: Pt is home in private [facility] Provider location: [facility] All persons participating in visit: Pt & Clinician Types of Service: Crisis Care  I connected with Laverle Hobby and/or [name] self by Telephone  (Video is Caregility application) and verified that I am speaking with the correct person using two identifiers.Discussed confidentiality: Yes   I discussed the limitations of telemedicine and the availability of in person appointments.  Discussed there is a possibility of technology failure and discussed alternative modes of communication if that failure occurs.  I discussed that engaging in this telemedicine visit, they consent to the provision of behavioral healthcare and the services will be billed under their insurance.  Patient and/or legal guardian expressed understanding and consented to Telemedicine visit: Yes   Presenting Concerns: Patient and/or family reports the following symptoms/concerns: Elevated anx/dep & stressed due to her lack of pain mgmt for her health issues. Pt exp'g SDOH impact. Duration of problem: several years; Severity of problem: severe  Patient and/or Family's Strengths/Protective Factors: Social and Emotional competence and Pt is housing insecure & needs [employer]chant [employer]. 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 Pt broke her L ankle & is now having pain on the R side due to compensating. Pt has an Atty. Pt has a Son in [place] & a Dtr locally. They both support her. Pt is unable to ambulate well & uses a wheelchair.   Goals Addressed: Patient  will: 1.  Reduce symptoms of: anxiety, depression, stress and defeat  2.  Increase knowledge and/or ability of: coping skills and stress reduction  3.  Demonstrate ability to: Increase healthy adjustment to current life circumstances  4. Pt would benefit from psychopharmalogicals to address easing of Sx of anx/dep  Progress towards Goals: Estb'd in today's visit; Pt made verbal contract she would not harm herself.  Interventions: Interventions utilized:  Intake/Assessment process Standardized Assessments completed: Pt in urgent situation calling for assistance.  Patient and/or Family Response: Pt receptive to call & Clinician suggestions today. Related to Pt she needs a CaseWorker & this Clinician will make Referral to Kelli Churn, RN.  Assessment: Patient currently experiencing urgent need to speak w/someone.  Pt is in pain today, stating her ankle that broke hurts constantly, it demobilizes her in most ways. She uses a wheelchair in the home to get around due to lack of ability to bear weight w/o extremee discomfort. Pt is unable to work due to this injury, but her Workman's Comp does not cover her bills. Pt disclosed she would never harm herself bc, "I'm a chicken & my GDtr is coming soon."   Pt's Atty is not being helpful. She has been waiting for the case to wrap up since 2016. Pt is a responsible lady w/several health issues that are concerning her. The lack of financial stability is causing high stress. Pt has Hx of Tx w/[facility], but she did not like her Tx there. No one was friendly or caring. The pain from her ankle is making it hard to concentrate.  Patient may benefit from pain mgmt techniques that are psychological in nature; mindfulness, relaxation videos, visualization  for stress reduction, & supportive cslg. Pt receptive & grateful for call today.  Plan: 1. Follow up with behavioral health clinician on : one week 2. Behavioral recommendations: Use [email or web address] when anxiety  elevates. Ask children for inc'd help. Speak w/Janet Broadus John, RN for Caseworker need. 3. Referral(s): [facility] (In Clinic)  I discussed the assessment and treatment plan with the patient and/or parent/guardian. They were provided an opportunity to ask questions and all were answered. They agreed with the plan and demonstrated an understanding of the instructions.   They were advised to call back or seek an in-person evaluation if the symptoms worsen or if the condition fails to improve as anticipated.  Donnetta Hutching, LMFT

## 2020-10-24 NOTE — Chronic Care Management (AMB) (Signed)
Care Management    RN Visit Note  10/24/2020 Name: Kayla [name] MRN: [id number] DOB: March 03, 1961  Subjective: Kayla [name] is a 60 y.o. year old female who is a primary care patient of Sherry Ruffing, Adela Lank, MD. The care management team was consulted for assistance with disease management and care coordination needs.    Engaged with patient by telephone  after returning call to patient in response to patient's request for assistance with applying for disability.    Assessment: Review of patient past medical history, allergies, medications, health status, including review of consultants reports, laboratory and other test data, was performed as part of comprehensive evaluation and provision of chronic care management services.   SDOH (Social Determinants of Health) assessments and interventions performed:    Care Plan  Allergies  Allergen Reactions  . Ace Inhibitors     Dizziness, nausea  . Percocet [Oxycodone-Acetaminophen] Itching  . Statins   . Latex Rash  . Sulfamethoxazole Rash  . Sulfites Rash    Outpatient Encounter Medications as of 10/24/2020  Medication Sig Note  . amLODipine (NORVASC) 5 MG tablet Take 1 tablet (5 mg total) by mouth daily.   Pleas Patricia Microlet Lancets lancets Check blood a sugar up to 3 times daily   . Blood Glucose Monitoring Suppl (ACCU-CHEK AVIVA PLUS) W/DEVICE KIT 1 kit by Does not apply route as directed.   . ferrous gluconate (FERGON) 324 MG tablet Take 1 tablet (324 mg total) by mouth daily with breakfast.   . furosemide (LASIX) 20 MG tablet Take 1 tablet (20 mg total) by mouth 2 (two) times daily.   Marland Kitchen gabapentin (NEURONTIN) 600 MG tablet Take 1 tablet (600 mg total) by mouth 3 (three) times daily.   Marland Kitchen glucose blood (CONTOUR NEXT TEST) test strip Test blood glucose 3 times daily   . Insulin Glargine (BASAGLAR KWIKPEN) 100 UNIT/ML SOPN Inject 0.2 mLs (20 Units total) into the skin daily.   Marland Kitchen ketoconazole (NIZORAL) 2 % shampoo Apply 1 application  topically 2 (two) times a week. (Patient taking differently: Apply 1 application topically 2 (two) times a week. On skin) 05/10/2019: Patient reports taking 1-2 times monthly for skin  . Lancet Devices (ACCU-CHEK SOFTCLIX) lancets Use as instructed   . lansoprazole (PREVACID) 30 MG capsule Take 1 capsule (30 mg total) by mouth daily.   Marland Kitchen levothyroxine (SYNTHROID) 200 MCG tablet Take 1 tablet (200 mcg total) by mouth once a week.   . lidocaine (LIDODERM) 5 % Place 1 patch onto the skin daily. Remove & Discard patch within 12 hours or as directed by MD   . pantoprazole (PROTONIX) 20 MG tablet Take 1 tablet (20 mg total) by mouth daily.   . sodium bicarbonate 650 MG tablet Take 2 tablets (1,300 mg total) by mouth 3 (three) times daily.   Marland Kitchen topiramate (TOPAMAX) 25 MG capsule Take 1-2 capsules (25-50 mg total) by mouth See admin instructions. Take 25 mg  in the morning, 50 mg at night   . triamcinolone cream (KENALOG) 0.1 % Apply 1 application topically daily.   Marland Kitchen venlafaxine XR (EFFEXOR XR) 37.5 MG 24 hr capsule Take 1 capsule (37.5 mg total) by mouth daily.   . Vitamin D, Ergocalciferol, (DRISDOL) 50000 units CAPS capsule Take 1 capsule (50,000 Units total) by mouth every 7 (seven) days. 05/13/2019: Have not started yet   No facility-administered encounter medications on file as of 10/24/2020.    Patient Active Problem List   Diagnosis Date Noted  . Abdominal  pain 03/28/2020  . Hx of Helicobacter infection 07/03/2019  . Iron deficiency anemia 07/03/2019  . PUD (peptic ulcer disease) 07/03/2019  . Acute esophagitis 07/03/2019  . CAP (community acquired pneumonia) 04/29/2019  . Nausea without vomiting   . Anorexia   . Weakness 04/26/2019  . Anemia due to blood loss, chronic 04/26/2019  . Urinary urgency 02/26/2019  . Vaginal discharge 02/26/2019  . Rash of body 02/26/2019  . Medication management 05/14/2018  . Right upper quadrant abdominal pain 05/04/2018  . Thrush, oral 05/04/2018  .  Trigger finger, left middle finger 12/15/2017  . Pain and swelling of left ankle 08/07/2017  . Yeast vaginitis 10/21/2016  . Hypercalcemia 11/28/2015  . 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 Elevated alkaline phosphatase level 06/27/2014  . CKD (chronic kidney disease) stage 4, GFR 15-29 ml/min (HCC) 06/27/2014  . Unspecified vitamin D deficiency 04/20/2013  . Essential hypertension, benign 02/15/2013  . AGUS favor benign 09/04/2012  . Multiple falls 08/17/2012  . Healthcare maintenance 08/17/2012  . Fibromyalgia syndrome 07/21/2012  . Hearing loss 02/20/2011  . PAP SMEAR, ABNORMAL 06/26/2009  . OBESITY, MILD 09/08/2008  . HLD (hyperlipidemia) 03/30/2007  . 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 DM type 2, uncontrolled, with severe neuropathy 07/31/2006  . SICKLE-CELL TRAIT 07/31/2006  . Depression 07/31/2006  . SHOULDER PAIN 07/31/2006  . Insomnia 07/31/2006  . PROTEINURIA 07/31/2006    Conditions to be addressed/monitored: IDDM, hypothyroidism s/p radio iodine Rx, HTN, HLD, CKD stage 4, PUD, depression  Patient Care Plan: Social Work Plan of Care  Completed 09/27/2020  Problem Identified: Barriers to Treatment/Transportation Resolved 09/27/2020    Long-Range Goal: Transportation resources identified Completed 09/14/2020  Start Date: 08/14/2020  Expected End Date: 09/25/2020  This Visit's Progress: On track  Recent Progress: On track  Priority: Medium  Note:   Current SDOH Barriers:  . Transportation  Clinical Social Work Clinical Goal(s):  Marland Kitchen Over the next 60 days, patient will work with SW to address concerns related to transportation/applying for [place] [place]  Interventions: Marland Kitchen Message sent to Almedia Balls with [employer] [place] requesting status of application  . Received response that patient was approved for service and certification packet was mailed  .  .  . Patient Self Care Activities:  . Patient verbalizes understanding of plan to work with  CCM BSW for [employer] [place] application      Problem Identified: Barriers to treatment/lack of health insurance Resolved 09/27/2020  Priority: High    Goal: Determination of eligibilty for health coverage Completed 09/27/2020  Start Date: 08/14/2020  Expected End Date: 08/28/2020  This Visit's Progress: Not on track  Recent Progress: On track  Priority: High  Note:   Current SDOH Barriers:  . Patient currently receiving workers compensation.  States she is not eligible for disability income, Medicare, or Medicaid due to this. Lack of health coverage is prohibiting access to appropriate provider care(pain management)  Clinical Social Work Clinical Goal(s):  Marland Kitchen Over the next 14 days, patient will work with SW to address concerns related to lack of insurance  Interventions: . Closing SW care plan due to inability to maintain contact  Patient Self Care Activities:  . Self administers medications as prescribed . Calls provider office for new concerns or questions      Patient Care Plan: CCM RN- General Plan of Care (Adult)    Problem Identified: patient has no health insurance or income other than workman's compensation, requesting assisting with applying for long term disability  Priority: High  Note:   Current Barriers:  . Unable to independently determine if she is eligible for Medicaid and or disability  Case Manager Clinical Goal(s):  Marland Kitchen Over the next 120 days, patient will work with CCM team to address needs related to Financial constraints related to lack of other health insurance and income in patient with HTN, HLD, DMII, and CKD Stage 4, depression Interventions:  . Contacted Disability Assistance Program  Patient Goals/Self-Care Activities . Over the next 120 days, patient will:   - Patient verbalizes understanding of plan to work with CCM team to investigate other health insurance options  -Self administers medications as prescribed Attends all scheduled provider  appointments -keep 33 percent of counseling appointments - schedule counseling appointment - ask family or friend for a ride - call to cancel if needed Calls pharmacy for medication refills Performs ADL's independently Performs IADL's independently Calls provider office for new concerns or questions  Follow Up Plan: The care management team will reach out to the patient again over the next 30 days.      Goal: Patient will receive assistance in applying for disability and keep behavioral helath counseling appointments   Start Date: 10/24/2020  This Visit's Progress: On track  Priority: High  Note:   Current Barriers:  . Care Coordination needs related to assisting patient with applying for disability  in a patient with IDDM, HTN, HLD, CKD, hypothyroidsim . Unable to independently apply for long term disability  Nurse Case Manager Clinical Goal(s):  Marland Kitchen Over the next 30-90 days, patient will work with CCM team to receive assistance in applying for long term disability.   Interventions:  . 1:1 collaboration with Asencion Noble, MD regarding development and update of comprehensive plan of care as evidenced by provider attestation and co-signature . Inter-disciplinary care team collaboration (see longitudinal plan of care) . Collaborated with disabilty assistance program  director Arrie Senate regarding assisting patient with applying for long term disability.  . Left voice mail for patient at 5:45 pm requesting return call to advise if patient still wishes to proceed with applying or long tern disability even though it may affect her workman's compensation income.   Patient Goals/Self-Care Activities Over the next 30-90 days, patient will:  - Patient will work with CCM team to address care coordination needs and will continue to work with the clinical team to address health care and disease management related needs.   - arrange a ride through an agency 1 week before appointment -  ask family or friend for a ride - call to cancel if needed   Follow Up Plan: The care management team will reach out to the patient again over the next 30 days.          Plan: The care management team will reach out to the patient again over the next 30 days.  Kelli Churn RN, CCM, [facility] RN Care Manager [phone]

## 2020-10-24 NOTE — Patient Instructions (Signed)
Visit Information It was nice speaking with you today. Patient Care Plan: Social Work Plan of Care  Completed 09/27/2020  Problem Identified: Barriers to Treatment/Transportation Resolved 09/27/2020    Long-Range Goal: Transportation resources identified Completed 09/14/2020  Start Date: 08/14/2020  Expected End Date: 09/25/2020  This Visit's Progress: On track  Recent Progress: On track  Priority: Medium  Note:   Current SDOH Barriers:  . Transportation  Clinical Social Work Clinical Goal(s):  Marland Kitchen Over the next 60 days, patient will work with SW to address concerns related to transportation/applying for [employer] [place]  Interventions: Marland Kitchen Message sent to Almedia Balls with [employer] [place] requesting status of application  . Received response that patient was approved for service and certification packet was mailed  .  .  . Patient Self Care Activities:  . Patient verbalizes understanding of plan to work with CCM BSW for [employer] [place] application      Problem Identified: Barriers to treatment/lack of health insurance Resolved 09/27/2020  Priority: High    Goal: Determination of eligibilty for health coverage Completed 09/27/2020  Start Date: 08/14/2020  Expected End Date: 08/28/2020  This Visit's Progress: Not on track  Recent Progress: On track  Priority: High  Note:   Current SDOH Barriers:  . Patient currently receiving workers compensation.  States she is not eligible for disability income, Medicare, or Medicaid due to this. Lack of health coverage is prohibiting access to appropriate provider care(pain management)  Clinical Social Work Clinical Goal(s):  Marland Kitchen Over the next 14 days, patient will work with SW to address concerns related to lack of insurance  Interventions: . Closing SW care plan due to inability to maintain contact  Patient Self Care Activities:  . Self administers medications as prescribed . Calls provider office for new concerns or questions      Patient Care  Plan: CCM RN- General Plan of Care (Adult)    Problem Identified: patient has no health insurance or income other than workman's compensation, requesting assisting with applying for long term disability   Priority: High  Note:   Current Barriers:  . Unable to independently determine if she is eligible for Medicaid and or disability  Case Manager Clinical Goal(s):  Marland Kitchen Over the next 120 days, patient will work with CCM team to address needs related to Financial constraints related to lack of other health insurance and income in patient with HTN, HLD, DMII, and CKD Stage 4, depression Interventions:  . Contacted Disability Assistance Program  Patient Goals/Self-Care Activities . Over the next 120 days, patient will:   - Patient verbalizes understanding of plan to work with CCM team to investigate other health insurance options  -Self administers medications as prescribed Attends all scheduled provider appointments -keep 47 percent of counseling appointments - schedule counseling appointment - ask family or friend for a ride - call to cancel if needed Calls pharmacy for medication refills Performs ADL's independently Performs IADL's independently Calls provider office for new concerns or questions  Follow Up Plan: The care management team will reach out to the patient again over the next 30 days.      Goal: Patient will receive assistance in applying for disability and keep behavioral helath counseling appointments   Start Date: 10/24/2020  This Visit's Progress: On track  Priority: High  Note:   Current Barriers:  . Care Coordination needs related to assisting patient with applying for disability  in a patient with IDDM, HTN, HLD, CKD, hypothyroidsim . Unable to independently apply for  long term disability  Nurse Case Manager Clinical Goal(s):  Marland Kitchen Over the next 30-90 days, patient will work with CCM team to receive assistance in applying for long term disability.   Interventions:   . 1:1 collaboration with Asencion Noble, MD regarding development and update of comprehensive plan of care as evidenced by provider attestation and co-signature . Inter-disciplinary care team collaboration (see longitudinal plan of care) . Collaborated with disabilty assistance program  director Arrie Senate regarding assisting patient with applying for long term disability.  . Left voice mail for patient at 5:45 pm requesting return call to advise if patient still wishes to proceed with applying or long tern disability even though it may affect her workman's compensation income.   Patient Goals/Self-Care Activities Over the next 30-90 days, patient will:  - Patient will work with CCM team to address care coordination needs and will continue to work with the clinical team to address health care and disease management related needs.   - arrange a ride through an agency 1 week before appointment - ask family or friend for a ride - call to cancel if needed   Follow Up Plan: The care management team will reach out to the patient again over the next 30 days.          The patient verbalized understanding of instructions, educational materials, and care plan provided today and declined offer to receive copy of patient instructions, educational materials, and care plan.     Kelli Churn RN, CCM, [facility] RN Care Manager [phone]

## 2020-10-26 NOTE — Progress Notes (Signed)
Internal Medicine Clinic Resident  I have personally reviewed this encounter including the documentation in this note and/or discussed this patient with the care management provider. I will address any urgent items identified by the care management provider and will communicate my actions to the patient's PCP. I have reviewed the patient's CCM visit with my supervising attending, Dr Daryll Drown.  Harvie Heck, MD  [facility] PGY-2 10/26/2020

## 2020-10-31 ENCOUNTER — Ambulatory Visit: Payer: Self-pay | Admitting: Behavioral Health

## 2020-10-31 ENCOUNTER — Ambulatory Visit: Payer: Self-pay | Admitting: *Deleted

## 2020-10-31 ENCOUNTER — Other Ambulatory Visit: Payer: Self-pay

## 2020-10-31 DIAGNOSIS — E890 Postprocedural hypothyroidism: Secondary | ICD-10-CM

## 2020-10-31 DIAGNOSIS — IMO0002 Reserved for concepts with insufficient information to code with codable children: Secondary | ICD-10-CM

## 2020-10-31 DIAGNOSIS — N184 Chronic kidney disease, stage 4 (severe): Secondary | ICD-10-CM

## 2020-10-31 DIAGNOSIS — R531 Weakness: Secondary | ICD-10-CM

## 2020-10-31 DIAGNOSIS — F332 Major depressive disorder, recurrent severe without psychotic features: Secondary | ICD-10-CM

## 2020-10-31 DIAGNOSIS — E1140 Type 2 diabetes mellitus with diabetic neuropathy, unspecified: Secondary | ICD-10-CM

## 2020-10-31 NOTE — Patient Instructions (Signed)
Visit Information    Patient Care Plan: Social Work Plan of Care  Completed 09/27/2020  Problem Identified: Barriers to Treatment/Transportation Resolved 09/27/2020    Long-Range Goal: Transportation resources identified Completed 09/14/2020  Start Date: 08/14/2020  Expected End Date: 09/25/2020  This Visit's Progress: On track  Recent Progress: On track  Priority: Medium  Note:   Current SDOH Barriers:  . Transportation  Clinical Social Work Clinical Goal(s):  Marland Kitchen Over the next 60 days, patient will work with SW to address concerns related to transportation/applying for [place] [place]  Interventions: Marland Kitchen Message sent to Almedia Balls with [employer] [place] requesting status of application  . Received response that patient was approved for service and certification packet was mailed  .  .  . Patient Self Care Activities:  . Patient verbalizes understanding of plan to work with CCM BSW for [employer] [place] application      Problem Identified: Barriers to treatment/lack of health insurance Resolved 09/27/2020  Priority: High    Goal: Determination of eligibilty for health coverage Completed 09/27/2020  Start Date: 08/14/2020  Expected End Date: 08/28/2020  This Visit's Progress: Not on track  Recent Progress: On track  Priority: High  Note:   Current SDOH Barriers:  . Patient currently receiving workers compensation.  States she is not eligible for disability income, Medicare, or Medicaid due to this. Lack of health coverage is prohibiting access to appropriate provider care(pain management)  Clinical Social Work Clinical Goal(s):  Marland Kitchen Over the next 14 days, patient will work with SW to address concerns related to lack of insurance  Interventions: . Closing SW care plan due to inability to maintain contact  Patient Self Care Activities:  . Self administers medications as prescribed . Calls provider office for new concerns or questions      Patient Care Plan: CCM RN- General Plan of  Care (Adult)    Problem Identified: patient has no health insurance or income other than workman's compensation, requesting assistance  with applying for long term disability Resolved 10/31/2020  Priority: High  Note:   Current Barriers:  . Unable to independently determine if she is eligible for Medicaid and or disability- returned call to patient, verified that she does want to procedd with applying for disaabilty even htough she acknowledges it will liekly jeopardized her workman's compensaiton income  Case Manager Clinical Goal(s):  Marland Kitchen Over the next 120 days, patient will work with CCM team to address needs related to Financial constraints related to lack of other health insurance and income in patient with HTN, HLD, DMII, and CKD Stage 4, depression Interventions:  . Provided patient with the phone number to the Disability Assistance Program provider through the [facility]  Patient Goals/Self-Care Activities . Over the next 120 days, patient will:   - Patient verbalizes understanding of plan to work with CCM team to investigate other health insurance options  -Self administers medications as prescribed Attends all scheduled provider appointments -keep 16 percent of counseling appointments - schedule counseling appointment - ask family or friend for a ride - call to cancel if needed Calls pharmacy for medication refills Performs ADL's independently Performs IADL's independently Calls provider office for new concerns or questions  Follow Up Plan: The care management team will reach out to the patient again over the next 30 days.      Goal: Patient will receive assistance in applying for disability and keep behavioral helath counseling appointments Completed 10/31/2020  Start Date: 10/24/2020  Recent Progress: On track  Priority: High  Note:   Current Barriers:  . Care Coordination needs related to assisting patient with applying for disability  in a patient with IDDM, HTN, HLD,  CKD, hypothyroidsim . Unable to independently apply for long term disability  Nurse Case Manager Clinical Goal(s):  Marland Kitchen Over the next 30-90 days, patient will work with CCM team to receive assistance in applying for long term disability.   Interventions:  . 1:1 collaboration with Asencion Noble, MD regarding development and update of comprehensive plan of care as evidenced by provider attestation and co-signature . Inter-disciplinary care team collaboration (see longitudinal plan of care) . Collaborated with disabilty assistance program  director Arrie Senate regarding assisting patient with applying for long term disability.  . Left voice mail for patient at 5:45 pm requesting return call to advise if patient still wishes to proceed with applying or long tern disability even though it may affect her workman's compensation income.   Patient Goals/Self-Care Activities Over the next 30-90 days, patient will:  - Patient will work with CCM team to address care coordination needs and will continue to work with the clinical team to address health care and disease management related needs.   - arrange a ride through an agency 1 week before appointment - ask family or friend for a ride - call to cancel if needed   Follow Up Plan: The care management team will reach out to the patient again over the next 30 days.          The patient verbalized understanding of instructions, educational materials, and care plan provided today and declined offer to receive copy of patient instructions, educational materials, and care plan.   No further follow up required: patietn wishes to focus on applying for long term disbilty at this time  Kelli Churn RN, CCM, [facility] RN Care Manager [phone]

## 2020-10-31 NOTE — BH Specialist Note (Signed)
Integrated Behavioral Health via Telemedicine Visit  10/31/2020 Kayla Wrisley [id number]  Number of [place] visits: 2/6 Session Start time: 3:20pm  Session End time: 4:00pm Total time: 40   Referring Provider: Dr. Lonia Skinner, MD Patient/Family location: Pt in private at home [facility] Provider location: [facility] Office All persons participating in visit: Pt & Clinician Types of Service: Individual psychotherapy  I connected with Kayla [name] and/or Kayla [name] self by Telephone  (Video is Caregility application) and verified that I am speaking with the correct person using two identifiers.Discussed confidentiality: Yes   I discussed the limitations of telemedicine and the availability of in person appointments.  Discussed there is a possibility of technology failure and discussed alternative modes of communication if that failure occurs.  I discussed that engaging in this telemedicine visit, they consent to the provision of behavioral healthcare and the services will be billed under their insurance.  Patient and/or legal guardian expressed understanding and consented to Telemedicine visit: Yes   Presenting Concerns: Patient and/or family reports the following symptoms/concerns: Tremors all morning that have caused instability Duration of problem: months; Severity of problem: moderate  Patient and/or Family's Strengths/Protective Factors: Concrete supports in place (healthy food, safe environments, etc.) and Willingness to share her ideas  Goals Addressed: Patient will: 1.  Reduce symptoms of: anxiety, depression and stress  2.  Increase knowledge and/or ability of: coping skills and stress reduction  3.  Demonstrate ability to: Increase healthy adjustment to current life circumstances  Progress towards Goals: Ongoing  Interventions: Interventions utilized:  Solution-Focused Strategies, Mindfulness or Relaxation Training and Supportive Counseling Standardized  Assessments completed: Not Needed  Patient and/or Family Response: Pt receptive to call today  Assessment: Patient currently experiencing elevated anxiety & pain.   Patient may benefit from inc'd coping skills, improved attn to medical needs & improved medication compliance.  Plan: 1. Follow up with behavioral health clinician on : 2 wks on telehealth for 30 min 2. Behavioral recommendations: Get medications delivered from Outpatient Pharmacy.  3. Referral(s): [place] (In Clinic)  I discussed the assessment and treatment plan with the patient and/or parent/guardian. They were provided an opportunity to ask questions and all were answered. They agreed with the plan and demonstrated an understanding of the instructions.   They were advised to call back or seek an in-person evaluation if the symptoms worsen or if the condition fails to improve as anticipated.  Donnetta Hutching, LMFT

## 2020-10-31 NOTE — Chronic Care Management (AMB) (Addendum)
Care Management    RN Visit Note  10/31/2020 Name: Kayla [name] MRN: [id number] DOB: 27-Mar-1961  Subjective: Kayla [name] is a 60 y.o. year old female who is a primary care patient of Sherry Ruffing, Adela Lank, MD. The care management team was consulted for assistance with disease management and care coordination needs.    Engaged with patient by telephone for follow up visit in response to provider referral for case management and/or care coordination services.   Consent to Services:   Ms. Even was given information about Care Management services today including:  1. Care Management services includes personalized support from designated clinical staff supervised by her physician, including individualized plan of care and coordination with other care providers 2. 24/7 contact phone numbers for assistance for urgent and routine care needs. 3. The patient may stop case management services at any time by phone call to the office staff.  Patient agreed to services and consent obtained.   Assessment: Review of patient past medical history, allergies, medications, health status, including review of consultants reports, laboratory and other test data, was performed as part of comprehensive evaluation and provision of chronic care management services.   SDOH (Social Determinants of Health) assessments and interventions performed:    Care Plan  Allergies  Allergen Reactions  . Ace Inhibitors     Dizziness, nausea  . Percocet [Oxycodone-Acetaminophen] Itching  . Statins   . Latex Rash  . Sulfamethoxazole Rash  . Sulfites Rash    Outpatient Encounter Medications as of 10/31/2020  Medication Sig Note  . amLODipine (NORVASC) 5 MG tablet Take 1 tablet (5 mg total) by mouth daily.   Pleas Patricia Microlet Lancets lancets Check blood a sugar up to 3 times daily   . Blood Glucose Monitoring Suppl (ACCU-CHEK AVIVA PLUS) W/DEVICE KIT 1 kit by Does not apply route as directed.   . ferrous gluconate (FERGON)  324 MG tablet Take 1 tablet (324 mg total) by mouth daily with breakfast.   . furosemide (LASIX) 20 MG tablet Take 1 tablet (20 mg total) by mouth 2 (two) times daily.   Marland Kitchen gabapentin (NEURONTIN) 600 MG tablet Take 1 tablet (600 mg total) by mouth 3 (three) times daily.   Marland Kitchen glucose blood (CONTOUR NEXT TEST) test strip Test blood glucose 3 times daily   . Insulin Glargine (BASAGLAR KWIKPEN) 100 UNIT/ML SOPN Inject 0.2 mLs (20 Units total) into the skin daily.   Marland Kitchen ketoconazole (NIZORAL) 2 % shampoo Apply 1 application topically 2 (two) times a week. (Patient taking differently: Apply 1 application topically 2 (two) times a week. On skin) 05/10/2019: Patient reports taking 1-2 times monthly for skin  . Lancet Devices (ACCU-CHEK SOFTCLIX) lancets Use as instructed   . lansoprazole (PREVACID) 30 MG capsule Take 1 capsule (30 mg total) by mouth daily.   Marland Kitchen levothyroxine (SYNTHROID) 200 MCG tablet Take 1 tablet (200 mcg total) by mouth once a week.   . lidocaine (LIDODERM) 5 % Place 1 patch onto the skin daily. Remove & Discard patch within 12 hours or as directed by MD   . pantoprazole (PROTONIX) 20 MG tablet Take 1 tablet (20 mg total) by mouth daily.   . sodium bicarbonate 650 MG tablet Take 2 tablets (1,300 mg total) by mouth 3 (three) times daily.   Marland Kitchen topiramate (TOPAMAX) 25 MG capsule Take 1-2 capsules (25-50 mg total) by mouth See admin instructions. Take 25 mg  in the morning, 50 mg at night   . triamcinolone cream (KENALOG)  0.1 % Apply 1 application topically daily.   Marland Kitchen venlafaxine XR (EFFEXOR XR) 37.5 MG 24 hr capsule Take 1 capsule (37.5 mg total) by mouth daily.   . Vitamin D, Ergocalciferol, (DRISDOL) 50000 units CAPS capsule Take 1 capsule (50,000 Units total) by mouth every 7 (seven) days. 05/13/2019: Have not started yet   No facility-administered encounter medications on file as of 10/31/2020.    Patient Active Problem List   Diagnosis Date Noted  . Abdominal pain 03/28/2020  . Hx of  Helicobacter infection 07/03/2019  . Iron deficiency anemia 07/03/2019  . PUD (peptic ulcer disease) 07/03/2019  . Acute esophagitis 07/03/2019  . CAP (community acquired pneumonia) 04/29/2019  . Nausea without vomiting   . Anorexia   . Weakness 04/26/2019  . Anemia due to blood loss, chronic 04/26/2019  . Urinary urgency 02/26/2019  . Vaginal discharge 02/26/2019  . Rash of body 02/26/2019  . Medication management 05/14/2018  . Right upper quadrant abdominal pain 05/04/2018  . Thrush, oral 05/04/2018  . Trigger finger, left middle finger 12/15/2017  . Pain and swelling of left ankle 08/07/2017  . Yeast vaginitis 10/21/2016  . Hypercalcemia 11/28/2015  . 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 Elevated alkaline phosphatase level 06/27/2014  . CKD (chronic kidney disease) stage 4, GFR 15-29 ml/min (HCC) 06/27/2014  . Unspecified vitamin D deficiency 04/20/2013  . Essential hypertension, benign 02/15/2013  . AGUS favor benign 09/04/2012  . Multiple falls 08/17/2012  . Healthcare maintenance 08/17/2012  . Fibromyalgia syndrome 07/21/2012  . Hearing loss 02/20/2011  . PAP SMEAR, ABNORMAL 06/26/2009  . OBESITY, MILD 09/08/2008  . HLD (hyperlipidemia) 03/30/2007  . 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 DM type 2, uncontrolled, with severe neuropathy 07/31/2006  . SICKLE-CELL TRAIT 07/31/2006  . Depression 07/31/2006  . SHOULDER PAIN 07/31/2006  . Insomnia 07/31/2006  . PROTEINURIA 07/31/2006    Conditions to be addressed/monitored: IDDM, hypothyroidism s/p radio iodine Rx, HTN, HLD, CKD stage 4, PUD, depression  Care Plan : CCM RN- General Plan of Care (Adult)  Updates made by Barrington Ellison, RN since 10/31/2020 12:00 AM    Problem: patient has no health insurance or income other than workman's compensation, requesting assistance  with applying for long term disability Resolved 10/31/2020  Priority: High  Note:   Current Barriers:  . Unable to  independently determine if she is eligible for Medicaid and or disability- returned call to patient, verified that she does want to proceed with applying for disability even though she acknowledges it will likely jeopardized her workman's compensation income  Case Manager Clinical Goal(s):  Marland Kitchen Over the next 120 days, patient will work with CCM team to address needs related to Financial constraints related to lack of other health insurance and income in patient with HTN, HLD, DMII, and CKD Stage 4, depression Interventions:  . Provided patient with the phone number to the Disability Assistance Program provider through the [facility]  Patient Goals/Self-Care Activities . Over the next 120 days, patient will:   - Patient verbalizes understanding of plan to work with CCM team to investigate other health insurance options  -Self administers medications as prescribed Attends all scheduled provider appointments -keep 8 percent of counseling appointments - schedule counseling appointment - ask family or friend for a ride - call to cancel if needed Calls pharmacy for medication refills Performs ADL's independently Performs IADL's independently Calls provider office for new concerns or questions  Follow Up Plan: The care management team will reach out  to the patient again over the next 30 days.      Goal: Patient will receive assistance in applying for disability and keep behavioral health counseling appointments Completed 10/31/2020  Start Date: 10/24/2020  Recent Progress: On track  Priority: High  Note:   Current Barriers:  . Care Coordination needs related to assisting patient with applying for disability  in a patient with IDDM, HTN, HLD, CKD, hypothyroidsim . Unable to independently apply for long term disability  Nurse Case Manager Clinical Goal(s):  Marland Kitchen Over the next 30-90 days, patient will work with CCM team to receive assistance in applying for long term disability.    Interventions:  . 1:1 collaboration with Asencion Noble, MD regarding development and update of comprehensive plan of care as evidenced by provider attestation and co-signature . Inter-disciplinary care team collaboration (see longitudinal plan of care) . Collaborated with disability assistance program  director Arrie Senate regarding assisting patient with applying for long term disability.  . Left voice mail for patient at 5:45 pm requesting return call to advise if patient still wishes to proceed with applying or long tern disability even though it may affect her workman's compensation income.  . Returned call to patient after she left voice mail earlier today. Provided patient with the phone number to the [place] provider through the [facility]  Patient Goals/Self-Care Activities Over the next 30-90 days, patient will:  - Patient will work with CCM team to address care coordination needs and will continue to work with the clinical team to address health care and disease management related needs.   - arrange a ride through an agency 1 week before appointment - ask family or friend for a ride - call to cancel if needed            Plan: No further follow up required: patient wishes to focus on applying for disability at this time.   Kelli Churn RN, CCM, [facility] RN Care Manager [phone]

## 2020-11-01 NOTE — Progress Notes (Signed)
Internal Medicine Clinic Attending  CCM services provided by the care management provider and their documentation were discussed with Dr. Marva Panda. We reviewed the pertinent findings, urgent action items addressed by the resident and non-urgent items to be addressed by the PCP.  I agree with the assessment, diagnosis, and plan of care documented in the CCM and resident's note.  Gilles Chiquito, MD 11/01/2020

## 2020-11-01 NOTE — Progress Notes (Signed)
Internal Medicine Clinic Resident  I have personally reviewed this encounter including the documentation in this note and/or discussed this patient with the care management provider. I will address any urgent items identified by the care management provider and will communicate my actions to the patient's PCP. I have reviewed the patient's CCM visit with my supervising attending, Dr Heber .  Andrew Au, MD 11/01/2020

## 2020-11-14 ENCOUNTER — Telehealth: Payer: Self-pay | Admitting: Behavioral Health

## 2020-11-14 ENCOUNTER — Ambulatory Visit: Payer: Self-pay | Admitting: Behavioral Health

## 2020-11-14 NOTE — Telephone Encounter (Signed)
Contacted Pt 3 times today to initiate visit. Lft Pt VM msg X 3. Requested Pt return call to [facility] for r/s purpose.  Dr. Theodis Shove

## 2020-12-13 NOTE — Addendum Note (Signed)
Addended by: Maudie Mercury C on: 12/13/2020 04:46 PM   Modules accepted: Orders

## 2021-01-29 ENCOUNTER — Encounter: Payer: Self-pay | Admitting: Internal Medicine

## 2021-01-29 NOTE — Progress Notes (Deleted)
   CC: ***  HPI:  [name] is a 60 y.o.   Past Medical History:  Diagnosis Date  . ANA POSITIVE 02/09/2010  . Anal warts 11/09/2012  . Anxiety   . Arthritis    "my bones ache all the time" (06/27/2014)  . Cellulitis 06/27/2014   medial proximal left thigh extending to the perineium  . Chronic bronchitis ([place])    "get it changing of the seasons; sometimes q yr; sometimes not"   . Complication of anesthesia    sats drop post op  . COPD (chronic obstructive pulmonary disease) ([place])   . 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 Diabetic peripheral neuropathy ([place]) since <2005   "hands and feet"  . Dizziness    Multiple falls at home  . Fibromyalgia   . Graves' disease    "they nuked it twice"  . [id number])    "monthly" (06/27/2014)  . Heart murmur    as a child-never had echo to evaluate  . Hyperlipidemia   . Hypertension   . Hypothyroidism   . Sickle cell trait ([place])   . Trimalleolar fracture of left ankle 11/21/2014  . Type 2 diabetes mellitus ([place])   . Wears glasses    Review of Systems:  ***  Physical Exam:  There were no vitals filed for this visit. ***  Assessment & Plan:   See Encounters Tab for problem based charting.  Patient {GC/GE:3044014::"discussed with","seen with"} Dr. {NAMES:[id number]::"Butcher","Guilloud","Hoffman","Mullen","Narendra","Raines","Vincent"}

## 2021-02-12 ENCOUNTER — Ambulatory Visit: Payer: Self-pay | Admitting: *Deleted

## 2021-02-12 DIAGNOSIS — IMO0002 Reserved for concepts with insufficient information to code with codable children: Secondary | ICD-10-CM

## 2021-02-12 DIAGNOSIS — N184 Chronic kidney disease, stage 4 (severe): Secondary | ICD-10-CM

## 2021-02-12 DIAGNOSIS — E1140 Type 2 diabetes mellitus with diabetic neuropathy, unspecified: Secondary | ICD-10-CM

## 2021-02-12 DIAGNOSIS — F332 Major depressive disorder, recurrent severe without psychotic features: Secondary | ICD-10-CM

## 2021-02-12 DIAGNOSIS — E890 Postprocedural hypothyroidism: Secondary | ICD-10-CM

## 2021-02-12 NOTE — Chronic Care Management (AMB) (Addendum)
  Care Management   Note  02/12/2021 Name: Kayla [name] MRN: [id number] DOB: 01/16/1961  Kayla [name] is enrolled in a Managed Medicaid plan: No.   A voice message was left by patient's daughter Kayla [name] on 02/09/21 at 3:19 pm on this CCM RN's business mobile number stating she was following up on numerous messages left on patient's phone that had not been addressed. Kayla [name] voice message states her mom (patient)  is deaf and disabled and cannot walk so she is requesting that all calls to patient go through her at her mobile number [phone].  Plan: Changed primary contact in Epic to patient's daughter Kayla [name].  No further follow up from [facility] RN indicated as patient is no longer receiving CCM services.  Kelli Churn RN, CCM, [facility] RN Care Manager [phone]

## 2021-02-24 ENCOUNTER — Encounter: Payer: Self-pay | Admitting: *Deleted

## 2021-03-16 ENCOUNTER — Emergency Department (HOSPITAL_COMMUNITY): Payer: Self-pay

## 2021-03-16 ENCOUNTER — Other Ambulatory Visit: Payer: Self-pay

## 2021-03-16 ENCOUNTER — Inpatient Hospital Stay (HOSPITAL_COMMUNITY)
Admission: EM | Admit: 2021-03-16 | Discharge: 2021-03-19 | DRG: 683 | Disposition: A | Discharge status: Home / Self Care | Payer: Self-pay | Attending: Student in an Organized Health Care Education/Training Program | Admitting: Student in an Organized Health Care Education/Training Program

## 2021-03-16 ENCOUNTER — Encounter (HOSPITAL_COMMUNITY): Payer: Self-pay

## 2021-03-16 DIAGNOSIS — E890 Postprocedural hypothyroidism: Secondary | ICD-10-CM | POA: Diagnosis present

## 2021-03-16 DIAGNOSIS — Z79899 Other long term (current) drug therapy: Secondary | ICD-10-CM

## 2021-03-16 DIAGNOSIS — E1142 Type 2 diabetes mellitus with diabetic polyneuropathy: Secondary | ICD-10-CM | POA: Diagnosis present

## 2021-03-16 DIAGNOSIS — Z823 Family history of stroke: Secondary | ICD-10-CM

## 2021-03-16 DIAGNOSIS — R296 Repeated falls: Secondary | ICD-10-CM | POA: Diagnosis present

## 2021-03-16 DIAGNOSIS — Z825 Family history of asthma and other chronic lower respiratory diseases: Secondary | ICD-10-CM

## 2021-03-16 DIAGNOSIS — Z8249 Family history of ischemic heart disease and other diseases of the circulatory system: Secondary | ICD-10-CM

## 2021-03-16 DIAGNOSIS — E872 Acidosis: Secondary | ICD-10-CM | POA: Diagnosis present

## 2021-03-16 DIAGNOSIS — D573 Sickle-cell trait: Secondary | ICD-10-CM | POA: Diagnosis present

## 2021-03-16 DIAGNOSIS — R011 Cardiac murmur, unspecified: Secondary | ICD-10-CM | POA: Diagnosis present

## 2021-03-16 DIAGNOSIS — I120 Hypertensive chronic kidney disease with stage 5 chronic kidney disease or end stage renal disease: Principal | ICD-10-CM | POA: Diagnosis present

## 2021-03-16 DIAGNOSIS — G8929 Other chronic pain: Secondary | ICD-10-CM | POA: Diagnosis present

## 2021-03-16 DIAGNOSIS — Z885 Allergy status to narcotic agent status: Secondary | ICD-10-CM

## 2021-03-16 DIAGNOSIS — R1084 Generalized abdominal pain: Secondary | ICD-10-CM

## 2021-03-16 DIAGNOSIS — Z87891 Personal history of nicotine dependence: Secondary | ICD-10-CM

## 2021-03-16 DIAGNOSIS — M549 Dorsalgia, unspecified: Secondary | ICD-10-CM

## 2021-03-16 DIAGNOSIS — M1990 Unspecified osteoarthritis, unspecified site: Secondary | ICD-10-CM | POA: Diagnosis present

## 2021-03-16 DIAGNOSIS — Z91040 Latex allergy status: Secondary | ICD-10-CM

## 2021-03-16 DIAGNOSIS — Z833 Family history of diabetes mellitus: Secondary | ICD-10-CM

## 2021-03-16 DIAGNOSIS — M25572 Pain in left ankle and joints of left foot: Secondary | ICD-10-CM | POA: Diagnosis present

## 2021-03-16 DIAGNOSIS — M797 Fibromyalgia: Secondary | ICD-10-CM | POA: Diagnosis present

## 2021-03-16 DIAGNOSIS — D649 Anemia, unspecified: Secondary | ICD-10-CM | POA: Diagnosis present

## 2021-03-16 DIAGNOSIS — Z9114 Patient's other noncompliance with medication regimen: Secondary | ICD-10-CM

## 2021-03-16 DIAGNOSIS — Z8279 Family history of other congenital malformations, deformations and chromosomal abnormalities: Secondary | ICD-10-CM

## 2021-03-16 DIAGNOSIS — I10 Essential (primary) hypertension: Secondary | ICD-10-CM

## 2021-03-16 DIAGNOSIS — Z79890 Hormone replacement therapy (postmenopausal): Secondary | ICD-10-CM

## 2021-03-16 DIAGNOSIS — E785 Hyperlipidemia, unspecified: Secondary | ICD-10-CM | POA: Diagnosis present

## 2021-03-16 DIAGNOSIS — Z20822 Contact with and (suspected) exposure to covid-19: Secondary | ICD-10-CM | POA: Diagnosis present

## 2021-03-16 DIAGNOSIS — Z803 Family history of malignant neoplasm of breast: Secondary | ICD-10-CM

## 2021-03-16 DIAGNOSIS — Z888 Allergy status to other drugs, medicaments and biological substances status: Secondary | ICD-10-CM

## 2021-03-16 DIAGNOSIS — H6123 Impacted cerumen, bilateral: Secondary | ICD-10-CM | POA: Diagnosis present

## 2021-03-16 DIAGNOSIS — Z993 Dependence on wheelchair: Secondary | ICD-10-CM

## 2021-03-16 DIAGNOSIS — J449 Chronic obstructive pulmonary disease, unspecified: Secondary | ICD-10-CM | POA: Diagnosis present

## 2021-03-16 DIAGNOSIS — D631 Anemia in chronic kidney disease: Secondary | ICD-10-CM | POA: Diagnosis present

## 2021-03-16 DIAGNOSIS — Z794 Long term (current) use of insulin: Secondary | ICD-10-CM

## 2021-03-16 DIAGNOSIS — Z882 Allergy status to sulfonamides status: Secondary | ICD-10-CM

## 2021-03-16 DIAGNOSIS — N185 Chronic kidney disease, stage 5: Secondary | ICD-10-CM | POA: Diagnosis present

## 2021-03-16 DIAGNOSIS — D6959 Other secondary thrombocytopenia: Secondary | ICD-10-CM | POA: Diagnosis present

## 2021-03-16 LAB — COMPREHENSIVE METABOLIC PANEL
ALT: 12 U/L (ref 0–44)
AST: 21 U/L (ref 15–41)
Albumin: 4 g/dL (ref 3.5–5.0)
Alkaline Phosphatase: 93 U/L (ref 38–126)
Anion gap: 12 (ref 5–15)
BUN: 62 mg/dL — ABNORMAL HIGH (ref 6–20)
CO2: 13 mmol/L — ABNORMAL LOW (ref 22–32)
Calcium: 9.5 mg/dL (ref 8.9–10.3)
Chloride: 116 mmol/L — ABNORMAL HIGH (ref 98–111)
Creatinine, Ser: 7.01 mg/dL — ABNORMAL HIGH (ref 0.44–1.00)
GFR, Estimated: 6 mL/min — ABNORMAL LOW (ref >60)
Glucose, Bld: 91 mg/dL (ref 70–99)
Potassium: 4.6 mmol/L (ref 3.5–5.1)
Sodium: 141 mmol/L (ref 135–145)
Total Bilirubin: 1.8 mg/dL — ABNORMAL HIGH (ref 0.3–1.2)
Total Protein: 7.1 g/dL (ref 6.5–8.1)

## 2021-03-16 LAB — CBC WITH DIFFERENTIAL/PLATELET
Abs Immature Granulocytes: 0.07 10*3/uL (ref 0.00–0.07)
Basophils Absolute: 0 10*3/uL (ref 0.0–0.1)
Basophils Relative: 0 %
Eosinophils Absolute: 0.1 10*3/uL (ref 0.0–0.5)
Eosinophils Relative: 1 %
HCT: 17 % — ABNORMAL LOW (ref 36.0–46.0)
Hemoglobin: 5.6 g/dL — CL (ref 12.0–15.0)
Immature Granulocytes: 1 %
Lymphocytes Relative: 15 %
Lymphs Abs: 1.2 10*3/uL (ref 0.7–4.0)
MCH: 32 pg (ref 26.0–34.0)
MCHC: 32.9 g/dL (ref 30.0–36.0)
MCV: 97.1 fL (ref 80.0–100.0)
Monocytes Absolute: 0.3 10*3/uL (ref 0.1–1.0)
Monocytes Relative: 4 %
Neutro Abs: 6.5 10*3/uL (ref 1.7–7.7)
Neutrophils Relative %: 79 %
Platelets: 73 10*3/uL — ABNORMAL LOW (ref 150–400)
RBC: 1.75 MIL/uL — ABNORMAL LOW (ref 3.87–5.11)
RDW: 14.7 % (ref 11.5–15.5)
WBC: 8.2 10*3/uL (ref 4.0–10.5)
nRBC: 0 % (ref 0.0–0.2)

## 2021-03-16 LAB — RESP PANEL BY RT-PCR (FLU A&B, COVID) ARPGX2
Influenza A by PCR: NEGATIVE
Influenza B by PCR: NEGATIVE
SARS Coronavirus 2 by RT PCR: NEGATIVE

## 2021-03-16 MED ORDER — SODIUM BICARBONATE 650 MG PO TABS
1300.0000 mg | ORAL_TABLET | Freq: Two times a day (BID) | ORAL | Status: DC
  Administered 2021-03-16 – 2021-03-19 (×6): 1300 mg via ORAL
  Filled 2021-03-16 (×6): qty 2

## 2021-03-16 MED ORDER — FENTANYL CITRATE (PF) 100 MCG/2ML IJ SOLN
100.0000 ug | INTRAMUSCULAR | Status: DC | PRN
  Administered 2021-03-16: 100 ug via INTRAVENOUS
  Filled 2021-03-16: qty 2

## 2021-03-16 MED ORDER — SODIUM CHLORIDE 0.9% IV SOLUTION: Freq: Once | INTRAVENOUS | Status: AC

## 2021-03-16 NOTE — ED Provider Notes (Signed)
Emergency Medicine Provider Triage Evaluation Note  Kayla [name] , [name] 60 y.o. female  was evaluated in triage.  Pt complains of pain. States she hurts all over. Has frequent falls due to chronic left ankle pain. Last fall 2 weeks ago. Not taking home meds. Poor appetite. She is unable to tell me much more on exam. Review of Systems  Positive: Pain, freq falls Negative: Unilateral weakness, numbness  Physical Exam  BP (!) 163/90 (BP Location: Right Arm)   Pulse 69   Temp 98.8 F (37.1 C) (Oral)   Resp 18   LMP 01/12/2014   SpO2 100%  Gen:   Awake, no distress   Resp:  Normal effort  MSK:   Moves extremities without difficulty. Diffuse tenderness everywhere I touch Other:    Medical Decision Making  Medically screening exam initiated at 6:06 PM.  Appropriate orders placed.  Kayla [name] was informed that the remainder of the evaluation will be completed by another provider, this initial triage assessment does not replace that evaluation, and the importance of remaining in the ED until their evaluation is complete.  Pain, frequent falls   Kayla Copado A, PA-C 03/16/21 1808    Daleen Bo, MD 03/17/21 1155

## 2021-03-16 NOTE — ED Triage Notes (Signed)
Pt BIB EMS from home. Pt reports headache, dizziness, right flank pain, and lower back back. Flank and back pain since fall about 2 weeks ago. Pt reports headache and dizziness x1 month. Pt reports not taking medications and no appetite. Per family, patient is A&O to baseline. Hx of fibromyalgia.

## 2021-03-16 NOTE — ED Notes (Signed)
Ultrasound tech at pt bedside

## 2021-03-16 NOTE — ED Provider Notes (Addendum)
[facility] DEPT Provider Note   CSN: [id number] Arrival date & time: 03/16/21  1737     History Chief Complaint  Patient presents with   Flank Pain   Headache   Dizziness    Kayla [name] is a 60 y.o. female.  HPI She presents for evaluation of flank pain and back pain, following a fall, 2 weeks ago.  Patient's daughter who states she is the POA, is the historian.  She states that patient lost her hearing completely, 2 weeks ago and can "only read lips."  Her daughter states she lost her hearing when she fell and struck her face on the floor.  Since that time she has had pain in her abdomen and back, and whenever she eats she vomits.  Her daughter is worried that she has cancer.  Patient is debilitated from an old injury of the left ankle, and gets around in a wheelchair.  She can apparently self transfer from a chair into the wheelchair.  Patient has not been coughing.  There are no other known active modifying factors.  Past Medical History:  Diagnosis Date   ANA POSITIVE 02/09/2010   Anal warts 11/09/2012   Anxiety    Arthritis    "my bones ache all the time" (06/27/2014)   Cellulitis 06/27/2014   medial proximal left thigh extending to the perineium   Chronic bronchitis ([place])    "get it changing of the seasons; sometimes q yr; sometimes not"    Complication of anesthesia    sats drop post op   COPD (chronic obstructive pulmonary disease) ([place])    Deafness in 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"   Diabetic peripheral neuropathy ([place]) since <2005   "hands and feet"   Dizziness    Multiple falls at home   Fibromyalgia    Graves' disease    "they nuked it twice"   Headache(784.0)    "monthly" (06/27/2014)   Heart murmur    as a child-never had echo to evaluate   Hyperlipidemia    Hypertension    Hypothyroidism    Sickle cell trait ([place])    Trimalleolar fracture of left ankle 11/21/2014   Type 2 diabetes mellitus ([place])    Wears  glasses     Patient Active Problem List   Diagnosis Date Noted   Abdominal pain 40/98/1191   Hx of Helicobacter infection 07/03/2019   Iron deficiency anemia 07/03/2019   PUD (peptic ulcer disease) 07/03/2019   Acute esophagitis 07/03/2019   CAP (community acquired pneumonia) 04/29/2019   Nausea without vomiting    Anorexia    Weakness 04/26/2019   Anemia due to blood loss, chronic 04/26/2019   Urinary urgency 02/26/2019   Vaginal discharge 02/26/2019   Rash of body 02/26/2019   Medication management 05/14/2018   Right upper quadrant abdominal pain 05/04/2018   Thrush, oral 05/04/2018   Trigger finger, left middle finger 12/15/2017   Pain and swelling of left ankle 08/07/2017   Yeast vaginitis 10/21/2016   Hypercalcemia 11/28/2015   Nummular eczematous dermatitis 11/27/2015   Elevated alkaline phosphatase level 06/27/2014   CKD (chronic kidney disease) stage 4, GFR 15-29 ml/min (HCC) 06/27/2014   Unspecified vitamin D deficiency 04/20/2013   Essential hypertension, benign 02/15/2013   AGUS favor benign 09/04/2012   Multiple falls 08/17/2012   Healthcare maintenance 08/17/2012   Fibromyalgia syndrome 07/21/2012   Hearing loss 02/20/2011   PAP SMEAR, ABNORMAL 06/26/2009   OBESITY, MILD 09/08/2008  HLD (hyperlipidemia) 03/30/2007   Hypothyroidism, postradioiodine therapy 10/13/2006   DM type 2, uncontrolled, with severe neuropathy 07/31/2006   SICKLE-CELL TRAIT 07/31/2006   Depression 07/31/2006   SHOULDER PAIN 07/31/2006   Insomnia 07/31/2006   PROTEINURIA 07/31/2006    Past Surgical History:  Procedure Laterality Date   ANKLE FRACTURE SURGERY Left 2008   "didn't put hardware in"   [place]  04/28/2019   Procedure: BIOPSY;  Surgeon: Jerene Bears, MD;  Location: [facility];  Service: Gastroenterology;;   [place]  1990's   ESOPHAGOGASTRODUODENOSCOPY (EGD) WITH PROPOFOL N/A 04/28/2019    Procedure: ESOPHAGOGASTRODUODENOSCOPY (EGD) WITH PROPOFOL;  Surgeon: Jerene Bears, MD;  Location: [facility];  Service: Gastroenterology;  Laterality: N/A;   FRACTURE SURGERY     INCISION AND DRAINAGE ABSCESS Left 07/01/2014   Procedure: INCISION AND DRAINAGE ABSCESS OF LEFT THIGH;  Surgeon: Georganna Skeans, MD;  Location: [facility];  Service: General;  Laterality: Left;   ORIF ANKLE FRACTURE Left 11/21/2014   Procedure: OPEN REDUCTION INTERNAL FIXATION (ORIF) TRI MALLEOLAR FRACTURE with retinacular repair;  Surgeon: Johnny Bridge, MD;  Location: [facility];  Service: Orthopedics;  Laterality: Left;     OB History     Gravida  3   Para  2   Term  2   Preterm  0   AB  1   Living  2      SAB  0   IAB  1   Ectopic  0   Multiple  0   Live Births              Family History  Problem Relation Age of Onset   Emphysema Mother    Cancer Mother        unknown   Hypertension Mother    Cancer Father        pancreatic    Diabetes Father    Hypertension Father    Down syndrome Brother    Cancer Maternal Aunt        cancer   Cancer Other        breast cancer aunt   Stroke Sister    Diabetes Sister    Colon cancer Neg Hx    Stomach cancer Neg Hx    Esophageal cancer Neg Hx    Inflammatory bowel disease Neg Hx    Liver disease Neg Hx    Pancreatic cancer Neg Hx    Rectal cancer Neg Hx     Social History   Tobacco Use   Smoking status: Former    Packs/day: 0.10    Years: 5.00    Pack years: 0.50    Types: Cigarettes    Quit date: 09/30/1990    Years since quitting: 30.4   Smokeless tobacco: Never   Tobacco comments:    1-2 everyday or so   Vaping Use   Vaping Use: Never used  Substance Use Topics   Alcohol use: No    Alcohol/week: 0.0 standard drinks   Drug use: No    Home Medications Prior to Admission medications   Medication Sig Start Date End Date Taking? Authorizing Provider  amLODipine (NORVASC) 5 MG tablet Take 1 tablet (5 mg  total) by mouth daily. 03/27/20   Asencion Noble, MD  Bayer Microlet Lancets lancets Check blood a sugar up to 3 times daily 07/23/19   Asencion Noble, MD  Blood Glucose Monitoring Suppl (ACCU-CHEK AVIVA PLUS) W/DEVICE KIT 1 kit by Does not apply route as directed. 05/15/11   Bartholomew Crews, MD  ferrous gluconate (FERGON) 324 MG tablet Take 1 tablet (324 mg total) by mouth daily with breakfast. 07/02/19   Mansouraty, Telford Nab., MD  furosemide (LASIX) 20 MG tablet Take 1 tablet (20 mg total) by mouth 2 (two) times daily. 03/08/20   Asencion Noble, MD  gabapentin (NEURONTIN) 600 MG tablet Take 1 tablet (600 mg total) by mouth 3 (three) times daily. 07/13/19   Forde Dandy, PharmD  glucose blood (CONTOUR NEXT TEST) test strip Test blood glucose 3 times daily 07/23/19   Asencion Noble, MD  Insulin Glargine [facility]) 100 UNIT/ML SOPN Inject 0.2 mLs (20 Units total) into the skin daily. 05/28/19   Asencion Noble, MD  ketoconazole (NIZORAL) 2 % shampoo Apply 1 application topically 2 (two) times a week. Patient taking differently: Apply 1 application topically 2 (two) times a week. On skin 03/01/19   Lorella Nimrod, MD  Lancet Devices [facility]) lancets Use as instructed 10/21/16   Dellia Nims, MD  lansoprazole (PREVACID) 30 MG capsule Take 1 capsule (30 mg total) by mouth daily. 06/30/19 09/28/19  Katherine Roan, MD  levothyroxine (SYNTHROID) 200 MCG tablet Take 1 tablet (200 mcg total) by mouth once a week. 08/02/20   Asencion Noble, MD  lidocaine (LIDODERM) 5 % Place 1 patch onto the skin daily. Remove & Discard patch within 12 hours or as directed by MD 05/07/19   Ladona Horns, MD  pantoprazole (PROTONIX) 20 MG tablet Take 1 tablet (20 mg total) by mouth daily. 03/27/20   Asencion Noble, MD  topiramate (TOPAMAX) 25 MG capsule Take 1-2 capsules (25-50 mg total) by mouth See admin instructions. Take 25 mg  in the morning, 50 mg at night 07/13/19   Forde Dandy, PharmD  triamcinolone cream (KENALOG) 0.1 % Apply 1 application topically daily. 07/22/19   Al Decant, MD  venlafaxine XR (EFFEXOR XR) 37.5 MG 24 hr capsule Take 1 capsule (37.5 mg total) by mouth daily. 07/22/19 07/21/20  Al Decant, MD  Vitamin D, Ergocalciferol, (DRISDOL) 50000 units CAPS capsule Take 1 capsule (50,000 Units total) by mouth every 7 (seven) days. 10/25/16   Dellia Nims, MD    Allergies    Ace inhibitors, Percocet [oxycodone-acetaminophen], Statins, Latex, Sulfamethoxazole, and Sulfites  Review of Systems   Review of Systems  All other systems reviewed and are negative.  Physical Exam Updated Vital Signs BP (!) 190/74   Pulse 66   Temp 98.2 F (36.8 C)   Resp 16   LMP 01/12/2014   SpO2 100%   Physical Exam Vitals and nursing note reviewed.  Constitutional:      Appearance: She is well-developed. She is not ill-appearing.  HENT:     Head: Normocephalic and atraumatic.     Right Ear: External ear normal.     Left Ear: External ear normal.  Eyes:     Conjunctiva/sclera: Conjunctivae normal.     Pupils: Pupils are equal, round, and reactive to light.  Neck:     Trachea: Phonation normal.  Cardiovascular:     Rate and Rhythm: Normal rate and regular rhythm.     Heart sounds: Normal heart sounds.  Pulmonary:     Effort: Pulmonary effort is normal.     Breath sounds: Normal breath sounds.  Abdominal:     General: There is no  distension.     Palpations: There is no mass.     Tenderness: There is abdominal tenderness (Diffusely tender to light touch.).  Musculoskeletal:        General: Normal range of motion.     Cervical back: Normal range of motion and neck supple.     Comments: Her entire back is exquisitely tender to light touch as are both legs.  She is able to move both legs, somewhat, and they appear symmetric.  She is holding both arms flexed at the elbows, and resists extension secondary to discomfort.  Skin:    General: Skin  is warm and dry.  Neurological:     Mental Status: She is alert.     Cranial Nerves: No cranial nerve deficit.     Motor: No abnormal muscle tone.     Coordination: Coordination normal.  Psychiatric:        Attention and Perception: She is inattentive.        Speech: She is noncommunicative.        Behavior: Behavior is withdrawn.     Comments: She is responsive when her daughter talks to her, with her face mask off.  Patient speaks in a normal voice and carries on conversation, in a tone/voice quality and interaction, that appears consistent with her having some hearing.    ED Results / Procedures / Treatments   Labs (all labs ordered are listed, but only abnormal results are displayed) Labs Reviewed  CBC WITH DIFFERENTIAL/PLATELET - Abnormal; Notable for the following components:      Result Value   RBC 1.75 (*)    Hemoglobin 5.6 (*)    HCT 17.0 (*)    Platelets 73 (*)    All other components within normal limits  COMPREHENSIVE METABOLIC PANEL - Abnormal; Notable for the following components:   Chloride 116 (*)    CO2 13 (*)    BUN 62 (*)    Creatinine, Ser 7.01 (*)    Total Bilirubin 1.8 (*)    GFR, Estimated 6 (*)    All other components within normal limits  RESP PANEL BY RT-PCR (FLU A&B, COVID) ARPGX2  URINALYSIS, ROUTINE W REFLEX MICROSCOPIC  IRON AND TIBC  FERRITIN  PARATHYROID HORMONE, INTACT (NO CA)  TYPE AND SCREEN  PREPARE RBC (CROSSMATCH)    EKG None  Radiology CT Abdomen Pelvis Wo Contrast  Result Date: 03/16/2021 CLINICAL DATA:  Flank pain for 2 weeks following fall, initial encounter EXAM: CT ABDOMEN AND PELVIS WITHOUT CONTRAST TECHNIQUE: Multidetector CT imaging of the abdomen and pelvis was performed following the standard protocol without IV contrast. COMPARISON:  04/13/2020 FINDINGS: Lower chest: Scarring is noted in the lung bases bilaterally stable in appearance from the prior exam. Hepatobiliary: No focal liver abnormality is seen. Gallbladder is  well distended. Single dependent gallstone is noted. Pancreas: Unremarkable. No pancreatic ductal dilatation or surrounding inflammatory changes. Spleen: Normal in size without focal abnormality. Adrenals/Urinary Tract: Adrenal glands are well visualized and show stable thickening without focal mass. Punctate bilateral calculi are identified without obstructive change. Mild perinephric stranding is again seen and stable. The ureters are within normal limits bilaterally. The bladder is partially distended. Stomach/Bowel: Colon is well visualized and within normal limits. The appendix is not seen consistent with prior appendectomy. No small bowel or gastric abnormality is seen. Vascular/Lymphatic: Aortic atherosclerosis. No enlarged abdominal or pelvic lymph nodes. Reproductive: Diffuse calcifications throughout the uterus are again identified. No adnexal mass is seen. Other: No abdominal wall  hernia or abnormality. No abdominopelvic ascites. Musculoskeletal: No acute or significant osseous findings. IMPRESSION: Cholelithiasis without complicating factors. Bibasilar scarring. Punctate bilateral renal calculi are noted. Electronically Signed   By: Inez Catalina M.D.   On: 03/16/2021 22:32   DG Chest 2 View  Result Date: 03/16/2021 CLINICAL DATA:  Headache, dizziness, right flank pain EXAM: CHEST - 2 VIEW COMPARISON:  05/13/2019 FINDINGS: Frontal and lateral views of the chest demonstrate an unremarkable cardiac silhouette. Linear opacities at the lung bases consistent with subsegmental atelectasis or scarring. No acute airspace disease, effusion, or pneumothorax. No acute displaced fractures. IMPRESSION: 1. Bibasilar scarring or atelectasis.  No acute airspace disease. Electronically Signed   By: Randa Ngo M.D.   On: 03/16/2021 22:22   CT L-SPINE NO CHARGE  Result Date: 03/16/2021 CLINICAL DATA:  Lumbar back pain. Fall 2 weeks ago. History of fibromyalgia. EXAM: CT LUMBAR SPINE WITHOUT CONTRAST TECHNIQUE:  Multidetector CT imaging of the lumbar spine was performed without intravenous contrast administration. Multiplanar CT image reconstructions were also generated. COMPARISON:  None. FINDINGS: Segmentation: 5 lumbar type vertebrae. Alignment: Normal. Vertebrae: No acute fracture or focal pathologic process. 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 Paraspinal and other soft tissues: Assessed on concurrent abdominopelvic CT, reported separately. Unremarkable noncontrast appearance of the paraspinal musculature. Disc levels: Mild broad-based disc bulge at L4-L5 and ligamentum flavum hypertrophy. This causes mild bilateral neural foraminal stenosis. Remaining disc spaces are preserved. IMPRESSION: 1. No acute fracture or subluxation of the lumbar spine. 2. Mild broad-based disc bulge at L4-L5 and ligamentum flavum hypertrophy causing mild bilateral neural foraminal stenosis. 3. 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 Electronically Signed   By: Keith Rake M.D.   On: 03/16/2021 22:37    Procedures .Critical Care  Date/Time: 03/16/2021 10:52 PM Performed by: Daleen Bo, MD Authorized by: Daleen Bo, MD   Critical care provider statement:    Critical care time (minutes):  50   Critical care start time:  03/16/2021 8:30 PM   Critical care end time:  03/16/2021 10:53 PM   Critical care time was exclusive of:  Separately billable procedures and treating other patients   Critical care was necessary to treat or prevent imminent or life-threatening deterioration of the following conditions:  Circulatory failure   Critical care was time spent personally by me on the following activities:  Blood draw for specimens, development of treatment plan with patient or surrogate, discussions with consultants, evaluation of patient's response to treatment, examination of patient, obtaining  history from patient or surrogate, ordering and performing treatments and interventions, ordering and review of laboratory studies, pulse oximetry, re-evaluation of patient's condition, review of old charts and ordering and review of radiographic studies   Medications Ordered in ED Medications  0.9 %  sodium chloride infusion (Manually program via Guardrails IV Fluids) (has no administration in time range)  fentaNYL (SUBLIMAZE) injection 100 mcg (100 mcg Intravenous Given 03/16/21 2142)  sodium bicarbonate tablet 1,300 mg (has no administration in time range)    ED Course  I have reviewed the triage vital signs and the nursing notes.  Pertinent labs & imaging results that were available during my care of the patient were reviewed by me and considered in my medical decision making (see chart for details).    MDM Rules/Calculators/A&P  Patient Vitals for the past 24 hrs:  BP Temp Temp src Pulse Resp SpO2  03/16/21 2234 (!) 190/74 -- -- 66 16 100 %  03/16/21 2100 (!) 192/82 -- -- 72 18 100 %  03/16/21 2000 (!) 198/77 -- -- 80 18 100 %  03/16/21 1952 (!) 206/87 98.2 F (36.8 C) -- 83 18 100 %  03/16/21 1749 (!) 163/90 98.8 F (37.1 C) Oral 69 18 100 %    10:50 PM Reevaluation with update and discussion. After initial assessment and treatment, an updated evaluation reveals she appears more comfortable now.  She states "I cannot hear you.  ". Daleen Bo   Medical Decision Making:  This patient is presenting for evaluation of abdominal and back pain, which does require a range of treatment options, and is a complaint that involves a high risk of morbidity and mortality. The differential diagnoses include progressive renal disease, depression, intra-abdominal abnormality. I decided to review old records, and in summary middle-aged female, being followed by primary care with type 2 diabetes, chronic kidney disease, and depression, presenting for pain in abdomen and  back.  She has not seen her PCP for several months.  She is debilitated, secondary to a prior ankle injury.  She currently has nonspecific systemic symptoms including pain..  I obtain additional historical information from patient's daughter at bedside.  Clinical Laboratory Tests Ordered, included CBC, Metabolic panel, and viral panel . Review indicates hemoglobin low, platelets low, chloride high, 50, BUN high, creatinine high. Radiologic Tests Ordered, included CT abdomen pelvis, CT L-spine, chest x-ray.  I independently Visualized: Radiographic images, which show abnormal findings include L4-5 disc with foraminal stenosis, gallstones without complicating features, remainder reassuring    Critical Interventions-clinical evaluation, laboratory testing, radiography, medication treatment, observation.  Consultation with nephrology, they will see as consultant and have entered some orders.  After These Interventions, the Patient was reevaluated and was found with worsening chronic kidney disease, and nonspecific abdominal back pain.  Patient's pain source is not clear.  Screening evaluation with CT abdomen pelvis and CT lumbar spine, showing gallstones, which are uncomplicated and G6-7 herniated disc.  Both of these could potentially cause pain however patient's pain is out of proportion to the findings seen on CT imaging.  She was given symptomatic treatment, in the ED.  She requires hospitalization for significant anemia, and consultation with nephrology regarding apparently worsening kidney disease, currently with normal potassium.  Mild hyper tension is present.  CRITICAL CARE-yes Performed by: Daleen Bo  Nursing Notes Reviewed/ Care Coordinated Applicable Imaging Reviewed Interpretation of Laboratory Data incorporated into ED treatment  10:52 PM-Consult complete with hospitalist. Patient case explained and discussed.  He agrees to admit patient for further evaluation and treatment. Call  ended at 10:59 PM     Final Clinical Impression(s) / ED Diagnoses Final diagnoses:  Back pain  CKD (chronic kidney disease), stage V (HCC)  Anemia, unspecified type  Back pain, unspecified back location, unspecified back pain laterality, unspecified chronicity  Generalized abdominal pain  Stage 5 chronic kidney disease not on chronic dialysis [facility])    Rx / DC Orders ED Discharge Orders     None        Daleen Bo, MD 03/16/21 2259    Daleen Bo, MD 03/16/21 2302

## 2021-03-16 NOTE — Consult Note (Signed)
[facility]  HISTORY AND PHYSICAL  Kayla [name] is an 60 y.o. female.    Chief Complaint:  body aches and pain  HPI: Pt is a 65F with a PMH sig for CKD V, DM II, Sickle cell trait, HTN, hypothyroidism, and fibromyalgia who is now seen in consultation at the request of Dr Eulis Foster for eval and recs re: CKD and anemia.    Pt was brought in by dtr for 1-2 weeks of abdominal and back pain.  Also has dizziness, weakness, and feeling cold all the time.   Labs revealed Cr of 7.01, K OK, CO2 13, Hgb 5.9, CXR clear.  In this setting we are asked to see.    Used to see a nephrologist but wasn't able to afford copay so hasn't been in a long time.  Last year Cr was 6.28 02/2020.    Dtr serenity is with her at bedside.    PMH: Past Medical History:  Diagnosis Date   ANA POSITIVE 02/09/2010   Anal warts 11/09/2012   Anxiety    Arthritis    "my bones ache all the time" (06/27/2014)   Cellulitis 06/27/2014   medial proximal left thigh extending to the perineium   Chronic bronchitis ([place])    "get it changing of the seasons; sometimes q yr; sometimes not"    Complication of anesthesia    sats drop post op   COPD (chronic obstructive pulmonary disease) ([place])    Deafness in 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"   Diabetic peripheral neuropathy ([place]) since <2005   "hands and feet"   Dizziness    Multiple falls at home   Fibromyalgia    Graves' disease    "they nuked it twice"   Headache(784.0)    "monthly" (06/27/2014)   Heart murmur    as a child-never had echo to evaluate   Hyperlipidemia    Hypertension    Hypothyroidism    Sickle cell trait ([place])    Trimalleolar fracture of left ankle 11/21/2014   Type 2 diabetes mellitus ([place])    Wears glasses    PSH: Past Surgical History:  Procedure Laterality Date   ANKLE FRACTURE SURGERY Left 2008   "didn't put hardware in"   [place]  04/28/2019   Procedure: BIOPSY;  Surgeon: Jerene Bears, MD;  Location:  [facility];  Service: Gastroenterology;;   [place] OF UTERUS  1990's   ESOPHAGOGASTRODUODENOSCOPY (EGD) WITH PROPOFOL N/A 04/28/2019   Procedure: ESOPHAGOGASTRODUODENOSCOPY (EGD) WITH PROPOFOL;  Surgeon: Jerene Bears, MD;  Location: [facility];  Service: Gastroenterology;  Laterality: N/A;   FRACTURE SURGERY     INCISION AND DRAINAGE ABSCESS Left 07/01/2014   Procedure: INCISION AND DRAINAGE ABSCESS OF LEFT THIGH;  Surgeon: Georganna Skeans, MD;  Location: [facility];  Service: General;  Laterality: Left;   ORIF ANKLE FRACTURE Left 11/21/2014   Procedure: OPEN REDUCTION INTERNAL FIXATION (ORIF) TRI MALLEOLAR FRACTURE with retinacular repair;  Surgeon: Johnny Bridge, MD;  Location: [facility];  Service: Orthopedics;  Laterality: Left;    Past Medical History:  Diagnosis Date   ANA POSITIVE 02/09/2010   Anal warts 11/09/2012   Anxiety    Arthritis    "my bones ache all the time" (06/27/2014)   Cellulitis 06/27/2014   medial proximal left thigh extending to the perineium   Chronic bronchitis ([place])    "get it changing  of the seasons; sometimes q yr; sometimes not"    Complication of anesthesia    sats drop post op   COPD (chronic obstructive pulmonary disease) ([place])    Deafness in 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"   Diabetic peripheral neuropathy ([place]) since <2005   "hands and feet"   Dizziness    Multiple falls at home   Fibromyalgia    Graves' disease    "they nuked it twice"   Headache(784.0)    "monthly" (06/27/2014)   Heart murmur    as a child-never had echo to evaluate   Hyperlipidemia    Hypertension    Hypothyroidism    Sickle cell trait ([place])    Trimalleolar fracture of left ankle 11/21/2014   Type 2 diabetes mellitus (HCC)    Wears glasses     Medications:  Amlodipine 5 mg daily Ferrous gluconate 324 mg daily Lasix 20 mg BID Gabapentin 600 TID Basiglar 20 u daily Prevacid 30 mg  daily Synthroid 200 mcg weekly Topamax 25 q AM and 50 mg q PM Effexor XR 37.5 mg daily Ergocalciferol 50,000 u weekly   ALLERGIES:   Allergies  Allergen Reactions   Ace Inhibitors     Dizziness, nausea   Percocet [Oxycodone-Acetaminophen] Itching   Statins    Latex Rash   Sulfamethoxazole Rash   Sulfites Rash    FAM HX: Family History  Problem Relation Age of Onset   Emphysema Mother    Cancer Mother        unknown   Hypertension Mother    Cancer Father        pancreatic    Diabetes Father    Hypertension Father    Down syndrome Brother    Cancer Maternal Aunt        cancer   Cancer Other        breast cancer aunt   Stroke Sister    Diabetes Sister    Colon cancer Neg Hx    Stomach cancer Neg Hx    Esophageal cancer Neg Hx    Inflammatory bowel disease Neg Hx    Liver disease Neg Hx    Pancreatic cancer Neg Hx    Rectal cancer Neg Hx     Social History:   reports that she quit smoking about 30 years ago. Her smoking use included cigarettes. She has a 0.50 pack-year smoking history. She has never used smokeless tobacco. She reports that she does not drink alcohol and does not use drugs.  ROS: ROS: all other systems reviewed and are negative except as per HPI  Blood pressure (!) 192/82, pulse 72, temperature 98.2 F (36.8 C), resp. rate 18, last menstrual period 01/12/2014, SpO2 100 %. PHYSICAL EXAM: Physical Exam GEN lying, curled up in bed in the fetal position, crying HEENT EOMI PERRL NECK no JVD PULM clear CV RRR, III/VI murmur ABD soft EXT very tender to touch, no LE edema NEURO AAO x 3 no asterixis SKIN good turgor    Results for orders placed or performed during the hospital encounter of 03/16/21 (from the past 48 hour(s))  CBC with Differential     Status: Abnormal   Collection Time: 03/16/21  6:35 PM  Result Value Ref Range   WBC 8.2 4.0 - 10.5 K/uL   RBC 1.75 (L) 3.87 - 5.11 MIL/uL   Hemoglobin 5.6 (LL) 12.0 - 15.0 g/dL    Comment:  This critical result has verified and been called to [facility]  by SEEL,MOLLY on 06 17 2022 at 1916, and has been read back.    HCT 17.0 (L) 36.0 - 46.0 %   MCV 97.1 80.0 - 100.0 fL   MCH 32.0 26.0 - 34.0 pg   MCHC 32.9 30.0 - 36.0 g/dL   RDW 14.7 11.5 - 15.5 %   Platelets 73 (L) 150 - 400 K/uL    Comment: SPECIMEN CHECKED FOR CLOTS Immature Platelet Fraction may be clinically indicated, consider ordering this additional test [id number] PLATELET COUNT CONFIRMED BY SMEAR    nRBC 0.0 0.0 - 0.2 %   Neutrophils Relative % 79 %   Neutro Abs 6.5 1.7 - 7.7 K/uL   Lymphocytes Relative 15 %   Lymphs Abs 1.2 0.7 - 4.0 K/uL   Monocytes Relative 4 %   Monocytes Absolute 0.3 0.1 - 1.0 K/uL   Eosinophils Relative 1 %   Eosinophils Absolute 0.1 0.0 - 0.5 K/uL   Basophils Relative 0 %   Basophils Absolute 0.0 0.0 - 0.1 K/uL   Immature Granulocytes 1 %   Abs Immature Granulocytes 0.07 0.00 - 0.07 K/uL    Comment: Performed at [facility], [place] [address], [place], [place] [address]  Comprehensive metabolic panel     Status: Abnormal   Collection Time: 03/16/21  6:35 PM  Result Value Ref Range   Sodium 141 135 - 145 mmol/L   Potassium 4.6 3.5 - 5.1 mmol/L   Chloride 116 (H) 98 - 111 mmol/L   CO2 13 (L) 22 - 32 mmol/L   Glucose, Bld 91 70 - 99 mg/dL    Comment: Glucose reference range applies only to samples taken after fasting for at least 8 hours.   BUN 62 (H) 6 - 20 mg/dL   Creatinine, Ser 7.01 (H) 0.44 - 1.00 mg/dL   Calcium 9.5 8.9 - 10.3 mg/dL   Total Protein 7.1 6.5 - 8.1 g/dL   Albumin 4.0 3.5 - 5.0 g/dL   AST 21 15 - 41 U/L   ALT 12 0 - 44 U/L   Alkaline Phosphatase 93 38 - 126 U/L   Total Bilirubin 1.8 (H) 0.3 - 1.2 mg/dL   GFR, Estimated 6 (L) >60 mL/min    Comment: (NOTE) Calculated using the CKD-EPI Creatinine Equation (2021)    Anion gap 12 5 - 15    Comment: Performed at [facility], [place] [address] [place], [place] [address]    No  results found.  Assessment/Plan   CKD V: progressive CKD over the past several years.  Cr was 6.28 last year, now up to 7.0.  Hard to tell if her symptoms are truly uremia or if they are related to anemia.   - no emergent indication for dialysis tonight - transfuse pRBCs - correct acidosis - need to have continued dialogue with pt and her dtr about CKD V---> progression to ESRD  2.  Anemia: - getting transfused 2 u pRBCs - will check iron stores and add ESA   3.  Back/ flank/ abd pain: - collecting urine culture - renal US  4.  DM II: - per primary  5.  Metabolic acidosis: - start Na bicarb  6.  HTN - add home meds  7.  Dispo: being admitted  [place], Benjamine Mola 03/16/2021, 10:14 PM

## 2021-03-16 NOTE — Plan of Care (Signed)
IMTS pt with CKD 5.  In with generalized weakness, Anemia with HGB 5.3.  No obvious bleed. Suspect this may be anemia of CKD due to not following with nephro over the past 1 year due to financial issues.  Creat 6.39 x1 year ago, creat 7.0 today.  HGB 5.3 today down from 8.1 x1 year ago.  Creat of 7.0, BP in the [id number] to [id number] systolic.  Worried fluid overload may get worse when we transfuse 2u PRBC for HGB of 5.x (pt high risk for TACO).  May or may not need dialysis during this Admit per Dr. Hollie Salk, nothing emergent though.  Spoke with Bed placement: was able to get a Tele bed assigned at [facility] (5M10).  Spoke with Dr. Alfonse Spruce with IMTS to give sign out and let him know about pt being transferred to [facility], pt has bed assigned, etc.

## 2021-03-17 DIAGNOSIS — D649 Anemia, unspecified: Secondary | ICD-10-CM | POA: Diagnosis present

## 2021-03-17 LAB — CBC
HCT: 14.7 % — ABNORMAL LOW (ref 36.0–46.0)
HCT: 30.1 % — ABNORMAL LOW (ref 36.0–46.0)
Hemoglobin: 5.1 g/dL — CL (ref 12.0–15.0)
Hemoglobin: 10.5 g/dL — ABNORMAL LOW (ref 12.0–15.0)
MCH: 32.9 pg (ref 26.0–34.0)
MCH: 31.1 pg (ref 26.0–34.0)
MCHC: 34.7 g/dL (ref 30.0–36.0)
MCHC: 34.9 g/dL (ref 30.0–36.0)
MCV: 94.8 fL (ref 80.0–100.0)
MCV: 89.1 fL (ref 80.0–100.0)
Platelets: 65 10*3/uL — ABNORMAL LOW (ref 150–400)
Platelets: 78 10*3/uL — ABNORMAL LOW (ref 150–400)
RBC: 1.55 MIL/uL — ABNORMAL LOW (ref 3.87–5.11)
RBC: 3.38 MIL/uL — ABNORMAL LOW (ref 3.87–5.11)
RDW: 14.1 % (ref 11.5–15.5)
RDW: 15.3 % (ref 11.5–15.5)
WBC: 7.8 10*3/uL (ref 4.0–10.5)
WBC: 8.7 10*3/uL (ref 4.0–10.5)
nRBC: 0 % (ref 0.0–0.2)
nRBC: 0 % (ref 0.0–0.2)

## 2021-03-17 LAB — COMPREHENSIVE METABOLIC PANEL
ALT: 12 U/L (ref 0–44)
AST: 22 U/L (ref 15–41)
Albumin: 3.6 g/dL (ref 3.5–5.0)
Alkaline Phosphatase: 92 U/L (ref 38–126)
Anion gap: 13 (ref 5–15)
BUN: 59 mg/dL — ABNORMAL HIGH (ref 6–20)
CO2: 14 mmol/L — ABNORMAL LOW (ref 22–32)
Calcium: 9.6 mg/dL (ref 8.9–10.3)
Chloride: 114 mmol/L — ABNORMAL HIGH (ref 98–111)
Creatinine, Ser: 7.12 mg/dL — ABNORMAL HIGH (ref 0.44–1.00)
GFR, Estimated: 6 mL/min — ABNORMAL LOW (ref >60)
Glucose, Bld: 103 mg/dL — ABNORMAL HIGH (ref 70–99)
Potassium: 4.6 mmol/L (ref 3.5–5.1)
Sodium: 141 mmol/L (ref 135–145)
Total Bilirubin: 1.9 mg/dL — ABNORMAL HIGH (ref 0.3–1.2)
Total Protein: 6.6 g/dL (ref 6.5–8.1)

## 2021-03-17 LAB — FERRITIN: Ferritin: 313 ng/mL — ABNORMAL HIGH (ref 11–307)

## 2021-03-17 LAB — CREATININE, SERUM
Creatinine, Ser: 7.06 mg/dL — ABNORMAL HIGH (ref 0.44–1.00)
GFR, Estimated: 6 mL/min — ABNORMAL LOW (ref >60)

## 2021-03-17 LAB — TECHNOLOGIST SMEAR REVIEW

## 2021-03-17 LAB — FOLATE: Folate: 9.7 ng/mL (ref >5.9)

## 2021-03-17 LAB — HIV ANTIBODY (ROUTINE TESTING W REFLEX): HIV Screen 4th Generation wRfx: NONREACTIVE

## 2021-03-17 LAB — IRON AND TIBC
Iron: 93 ug/dL (ref 28–170)
Saturation Ratios: 38 % — ABNORMAL HIGH (ref 10.4–31.8)
TIBC: 242 ug/dL — ABNORMAL LOW (ref 250–450)
UIBC: 149 ug/dL

## 2021-03-17 LAB — PREPARE RBC (CROSSMATCH)

## 2021-03-17 LAB — PROTIME-INR
INR: 1.1 (ref 0.8–1.2)
Prothrombin Time: 14.4 seconds (ref 11.4–15.2)

## 2021-03-17 LAB — GLUCOSE, CAPILLARY
Glucose-Capillary: 89 mg/dL (ref 70–99)
Glucose-Capillary: 107 mg/dL — ABNORMAL HIGH (ref 70–99)
Glucose-Capillary: 120 mg/dL — ABNORMAL HIGH (ref 70–99)
Glucose-Capillary: 121 mg/dL — ABNORMAL HIGH (ref 70–99)

## 2021-03-17 LAB — BILIRUBIN, FRACTIONATED(TOT/DIR/INDIR)
Bilirubin, Direct: 0.4 mg/dL — ABNORMAL HIGH (ref 0.0–0.2)
Indirect Bilirubin: 2 mg/dL — ABNORMAL HIGH (ref 0.3–0.9)
Total Bilirubin: 2.4 mg/dL — ABNORMAL HIGH (ref 0.3–1.2)

## 2021-03-17 LAB — MAGNESIUM: Magnesium: 1.7 mg/dL (ref 1.7–2.4)

## 2021-03-17 LAB — PHOSPHORUS: Phosphorus: 4.5 mg/dL (ref 2.5–4.6)

## 2021-03-17 LAB — T4, FREE: Free T4: <0.25 ng/dL — ABNORMAL LOW (ref 0.61–1.12)

## 2021-03-17 LAB — APTT: aPTT: 39 seconds — ABNORMAL HIGH (ref 24–36)

## 2021-03-17 LAB — VITAMIN B12: Vitamin B-12: 992 pg/mL — ABNORMAL HIGH (ref 180–914)

## 2021-03-17 LAB — LACTATE DEHYDROGENASE: LDH: 285 U/L — ABNORMAL HIGH (ref 98–192)

## 2021-03-17 LAB — TSH: TSH: 161.208 u[IU]/mL — ABNORMAL HIGH (ref 0.350–4.500)

## 2021-03-17 MED ORDER — LIDOCAINE 5 % EX PTCH
1.0000 | MEDICATED_PATCH | CUTANEOUS | Status: DC
  Administered 2021-03-17 – 2021-03-19 (×3): 1 via TRANSDERMAL
  Filled 2021-03-17 (×3): qty 1

## 2021-03-17 MED ORDER — ACETAMINOPHEN 650 MG RE SUPP: 650.0000 mg | Freq: Four times a day (QID) | RECTAL | Status: DC | PRN

## 2021-03-17 MED ORDER — LEVOTHYROXINE SODIUM 100 MCG PO TABS
100.0000 ug | ORAL_TABLET | Freq: Every day | ORAL | Status: DC
  Administered 2021-03-17: 100 ug via ORAL
  Filled 2021-03-17: qty 1

## 2021-03-17 MED ORDER — INSULIN ASPART 100 UNIT/ML IJ SOLN
0.0000 [IU] | Freq: Three times a day (TID) | INTRAMUSCULAR | Status: DC
  Administered 2021-03-18 – 2021-03-19 (×2): 1 [IU] via SUBCUTANEOUS

## 2021-03-17 MED ORDER — FAMOTIDINE 20 MG PO TABS
10.0000 mg | ORAL_TABLET | Freq: Every day | ORAL | Status: DC
  Administered 2021-03-17 – 2021-03-19 (×3): 10 mg via ORAL
  Filled 2021-03-17 (×3): qty 1

## 2021-03-17 MED ORDER — POLYETHYLENE GLYCOL 3350 17 G PO PACK: 17.0000 g | PACK | Freq: Every day | ORAL | Status: DC | PRN

## 2021-03-17 MED ORDER — SODIUM CHLORIDE 0.9% IV SOLUTION: Freq: Once | INTRAVENOUS | Status: DC

## 2021-03-17 MED ORDER — DARBEPOETIN ALFA 60 MCG/0.3ML IJ SOSY
60.0000 ug | PREFILLED_SYRINGE | INTRAMUSCULAR | Status: DC
  Administered 2021-03-17: 60 ug via SUBCUTANEOUS
  Filled 2021-03-17: qty 0.3

## 2021-03-17 MED ORDER — HYDRALAZINE HCL 10 MG PO TABS
10.0000 mg | ORAL_TABLET | Freq: Three times a day (TID) | ORAL | Status: DC
  Administered 2021-03-17: 10 mg via ORAL
  Filled 2021-03-17: qty 1

## 2021-03-17 MED ORDER — ACETAMINOPHEN 325 MG PO TABS
650.0000 mg | ORAL_TABLET | Freq: Four times a day (QID) | ORAL | Status: DC | PRN
  Administered 2021-03-17 – 2021-03-19 (×5): 650 mg via ORAL
  Filled 2021-03-17 (×5): qty 2

## 2021-03-17 MED ORDER — CARBAMIDE PEROXIDE 6.5 % OT SOLN
5.0000 [drp] | Freq: Two times a day (BID) | OTIC | Status: DC
  Administered 2021-03-17 – 2021-03-19 (×5): 5 [drp] via OTIC
  Filled 2021-03-17: qty 15

## 2021-03-17 MED ORDER — ALBUTEROL SULFATE HFA 108 (90 BASE) MCG/ACT IN AERS
1.0000 | INHALATION_SPRAY | Freq: Four times a day (QID) | RESPIRATORY_TRACT | Status: DC | PRN
  Filled 2021-03-17: qty 6.7

## 2021-03-17 MED ORDER — ONDANSETRON HCL 4 MG PO TABS: 4.0000 mg | ORAL_TABLET | Freq: Four times a day (QID) | ORAL | Status: DC | PRN

## 2021-03-17 MED ORDER — PANTOPRAZOLE SODIUM 20 MG PO TBEC: 20.0000 mg | DELAYED_RELEASE_TABLET | Freq: Every day | ORAL | Status: DC

## 2021-03-17 MED ORDER — AMLODIPINE BESYLATE 10 MG PO TABS
10.0000 mg | ORAL_TABLET | Freq: Every day | ORAL | Status: DC
  Administered 2021-03-18 – 2021-03-19 (×2): 10 mg via ORAL
  Filled 2021-03-17 (×2): qty 1

## 2021-03-17 MED ORDER — AMLODIPINE BESYLATE 5 MG PO TABS
5.0000 mg | ORAL_TABLET | Freq: Every day | ORAL | Status: DC
  Administered 2021-03-17: 5 mg via ORAL
  Filled 2021-03-17: qty 1

## 2021-03-17 MED ORDER — ONDANSETRON HCL 4 MG/2ML IJ SOLN
4.0000 mg | Freq: Four times a day (QID) | INTRAMUSCULAR | Status: DC | PRN
  Administered 2021-03-17: 4 mg via INTRAVENOUS
  Filled 2021-03-17: qty 2

## 2021-03-17 MED ORDER — HYDRALAZINE HCL 25 MG PO TABS
25.0000 mg | ORAL_TABLET | Freq: Three times a day (TID) | ORAL | Status: DC
  Administered 2021-03-17 – 2021-03-18 (×3): 25 mg via ORAL
  Filled 2021-03-17 (×3): qty 1

## 2021-03-17 MED ORDER — LEVOTHYROXINE SODIUM 100 MCG PO TABS: 100.0000 ug | ORAL_TABLET | Freq: Every day | ORAL | Status: DC

## 2021-03-17 MED ORDER — HEPARIN SODIUM (PORCINE) 5000 UNIT/ML IJ SOLN
5000.0000 [IU] | Freq: Three times a day (TID) | INTRAMUSCULAR | Status: DC
  Administered 2021-03-17: 5000 [IU] via SUBCUTANEOUS
  Filled 2021-03-17: qty 1

## 2021-03-17 NOTE — Progress Notes (Signed)
BRIEF NOTE  Subjective:  Paged by RN at 2193 that patient is requesting to speak with a provider due to feeling jittery and having palpitations after receiving levothyroxine at 1740. Most recent vitals stable, P 70, BP 157/79, SpO2 99% on room air. Spoke with patient via room phone, and she reports she is having chest pain in addition to palpitations.   Presented to evaluate patient at bedside. She is sitting up in bed in no acute distress. She reports since receiving the levothyroxine she has been feeling like she "wants to jump out" of her skin. Endorses chest pain and back pain which she reports are chronic due to her fibromyalgia but are now worse. Denies radiation, nausea, shortness of breath. Per RN, patient has nausea earlier in the evening for which she received Zofran 2002 with improvement. Patient states she has not been adherent to levothyroxine in the past due to the side effects she is currently experiencing.   Objective:  General: Patient he is well-appearing in no acute distress. Cardio/Chest: RRR, no m/r/g; tenderness to palpation of sternum Pulm: breathing comfortably on room air, lungs clear to auscultation bilaterally  Assessment/Plan: Patient reports palpitations and worsening of chronic reproducible chest pain after taking levothyroxine earlier this evening. She requests to be taken off the levothyroxine. Her vitals are stable and exam is overall reassuring. Will continue to monitor and if the nature of her chest pain changes will have low threshold to obtain EKG. Counseled the patient on the importance of treating her severe hypothyroidism and she states she simply does not tolerate the medication. Will discuss with the day team.  Alexandria Lodge, MD Internal Medicine Resident, PGY-1 [facility] Pager: [phone] 10:33 PM, 03/17/2021

## 2021-03-17 NOTE — Plan of Care (Signed)
  Problem: Education: Goal: Knowledge of disease and its progression will improve Outcome: Progressing   Problem: Fluid Volume: Goal: Compliance with measures to maintain balanced fluid volume will improve Outcome: Progressing   Problem: Nutritional: Goal: Ability to make healthy dietary choices will improve Outcome: Progressing

## 2021-03-17 NOTE — Progress Notes (Signed)
Subjective:   Overnight events: Patient was admitted to [facility] and transferred to [facility] hospital. IMTS resumed care from [facility].   Kayla [name] states she is having diffuse pain all over and is tired of being uncomfortable. She states that she has previously been on thyroid hormone replacement but could not tolerate generic Levothyroxine. She understands that we will need to restart replacement therapy. She expresses that hear drops placed earlier have not helped her hearing and she is hoping to have the cerumen removed.   No current complaints of chest pain, shortness of breathe, dizziness, nausea, vomiting.   Objective:  Vital signs in last 24 hours: Vitals:   03/17/21 1053 03/17/21 1100 03/17/21 1128 03/17/21 1418  BP: (!) 180/80 (!) 180/80 (!) 180/76 (!) 180/80  Pulse: 67 67 65 70  Resp: '16 16 18 16  '$ Temp: 98.4 F (36.9 C) 98.4 F (36.9 C) 98.6 F (37 C) 98.3 F (36.8 C)  TempSrc: Oral Oral Oral Oral  SpO2: 100% 100% 100% 100%   Physical Exam Vitals and nursing note reviewed.  Constitutional:      General: She is not in acute distress.    Appearance: She is normal weight.  HENT:     Head: Normocephalic and atraumatic.     Right Ear: Decreased hearing noted.     Left Ear: Decreased hearing noted.     Mouth/Throat:     Mouth: Mucous membranes are moist.     Pharynx: Oropharynx is clear.  Eyes:     Extraocular Movements: Extraocular movements intact.     Conjunctiva/sclera: Conjunctivae normal.     Pupils: Pupils are equal, round, and reactive to light.  Cardiovascular:     Rate and Rhythm: Normal rate and regular rhythm.     Heart sounds: Murmur (3/6 holosystolic murmur heard throughout examination.) heard.  Pulmonary:     Effort: Pulmonary effort is normal. No respiratory distress.     Breath sounds: Normal breath sounds. No wheezing, rhonchi or rales.  Abdominal:     General: Bowel sounds are normal. There is no distension.     Palpations: Abdomen is  soft.     Tenderness: There is no abdominal tenderness. There is no guarding.  Skin:    General: Skin is warm and dry.  Neurological:     General: No focal deficit present.     Mental Status: She is alert and oriented to person, place, and time.  Psychiatric:        Mood and Affect: Mood normal.        Behavior: Behavior normal.        Thought Content: Thought content normal.        Judgment: Judgment normal.   Assessment/Plan:  Active Problems:   CKD (chronic kidney disease) stage 5, GFR less than 15 ml/min (HCC)   Symptomatic anemia  Kayla [name] is a 60 year old female pretension, hyperlipidemia, type 2 diabetes, ESRD, hypothyroidism/Graves' disease, who presented to the ED for 2 weeks of right-sided back pain after a fall.  Also found to have symptomatic anemia with hemoglobin of 5.6 of unknown bleeding source and worsening renal function that may require dialysis.   # Symptomatic anemia # Hx of Sickle Cell Trait  Unclear source at this time.  She was admitted on 2020 for upper GI bleed, found to have H. pylori.  EGD on the admission reveals erosive esophagitis, gastritis and duodenal ulcer.  She was treated for H. pylori but did not have a stool  eradication confirmation test.  No hematemesis or melena during this admission thus far.  We will obtain an FOBT.  Iron panel does not show any acute iron deficiency, which may indicate that her current anemia is secondary to chronic renal disease or history of sickle cell trait (diagnosed in 2015) rather than an acute bleed.  She has received 2 units of packed red blood cells thus far with posttransfusion hemoglobin pending.  -H&H posttransfusion -Nephrology to start Aranesp  -Check H. pylori stool test -CBC in a.m.   # ESRD # Hypertension ESRD secondary to uncontrolled hypertension progressive over the past several years.  Her creatinine is stable at this time, however patient was noted to be having some symptoms concerning for uremia  on admission.  Nephrology has evaluated and do not currently recommend emergent dialysis.   -Appreciate nephrology recommendations -Avoid nephrotoxic medications -Sodium bicarb for metabolic acidosis -Decision for dialysis per nephro -Increase amlodipine to 10 mg daily -Increase hydralazine to 25 mg 3 times daily   # Acquired Hypothyroidism # Grave's Disease Patient reports history of Graves' disease status post multiple on ambulation.  TSH has been markedly elevated for at least 2 years now.  Reevaluated on this admission elevated at 161; T4 level is < 0.25.Marland Kitchen  We will restart thyroid replacement therapy.  No evidence of myxedema coma at this time.   -Start levothyroxine 100 mcg daily -Recheck TSH in 1 to 2 weeks   # Thrombocytopenia Patient has an intermittent history of single episodes of thrombocytopenia in the past, however current thrombocytopenia is the most severe its been.  Platelets initially at 73 on admission and decreased further to 65. Bilirubin is mildly elevated, however no other signs of hemolysis at this time. No known hx of hepatic dysfunction or abnormal liver on imaging; INR is unremarkable.  This could be in the setting of ESRD. Will need to investigate further.   -LDH, differentiated bilirubin pending  -Blood smear pending -Continue to trend platelets while admitted -Consider referral to heme-onc on discharge  # Right-sided back pain Likely musculoskeletal after the fall 2 weeks ago.  No rib fractures seen on chest x-ray and CT L-spine with arthritic changes only seen; no acute findings.  CT abdomen showed bilateral punctate renal calculi that has been chronic since 2021.  Renal ultrasound show a nonobstructing 6 mm stone but on the left side.   -Trial lidocaine patch for pain -Tylenol as needed -UA pending   # Type 2 Diabetes Mellitus  - Hgb A1c pending  - Sliding scale insulin  # Systolic Murmur - Echo ordered   # Recurrent falls Multifactorial in the  setting of uncontrolled hypothyroidism with longstanding anemia.  She also have severe diabetic neuropathy that affects her mobility.   -PT/OT evaluation pending    # Bilateral Hearing loss Likely due to bilateral cerumen impaction seen on physical exam.     -Received ear drops. Will attempt cerumen disimpaction this afternoon/  -Consider ENT referral if no improvement afterwards   # COPD  - Albuterol PRN  CODE STATUS Full IV fluid: N/A DVT: Heparin Diet: Renal  Prior to Admission Living Arrangement: Home Anticipated Discharge Location: TBD, pending PT/OT recommendations  Barriers to Discharge: Continued medical evaluation and stabilization  Dispo: Anticipated discharge in approximately 2-3 day(s).   Dr. Jose Persia Internal Medicine PGY-2  Pager: [phone] After 5pm on weekdays and 1pm on weekends: On Call pager [phone]  03/17/2021, 2:41 PM

## 2021-03-17 NOTE — Progress Notes (Signed)
NEW ADMISSION NOTE New Admission Note:   Arrival Method: [place] Mental Orientation: A&O x4 Telemetry: 5M11 Assessment: Completed Skin: Intact. Pt skin assessment completed with Nikki,RN. No open wounds or pressur ulcers noted.   IV: 20GRFA Pain: Generalized pain 6/10 Tubes: none present Safety Measures: Safety Fall Prevention Plan has been given, discussed and signed Admission: Completed [facility] [place] Orientation: Patient has been orientated to the room, unit and staff. Family: none present   Orders have been reviewed and implemented. Will continue to monitor the patient. Call light has been placed within reach and bed alarm has been activated.

## 2021-03-17 NOTE — H&P (Addendum)
Date: 03/17/2021               Patient Name:  Kayla [name] MRN: [id number]  DOB: Mar 25, 1961 Age / Sex: 60 y.o., female   PCP: Asencion Noble, MD         Medical Service: Internal Medicine Teaching Service         Attending Physician: Dr. Dorna Mai    First Contact: Dr. Johnney Ou  Pager: [phone]  Second Contact: Dr. Charleen Kirks Pager: [phone]       After Hours (After 5p/  First Contact Pager: [phone]  weekends / holidays): Second Contact Pager: [phone]   Chief Complaint: Back pain   History of Present Illness:  Kayla [name] is a 60 year old female pretension, hyperlipidemia, type 2 diabetes, ESRD, hypothyroidism/Graves' disease, who presented to the ED for 2 weeks of right-sided back pain.  Patient stated that she fell 2 weeks ago and her something "snap, crackle, and pop".  States the pain is getting worse, rated 10/10.  Said that she is taking pain medication from her pain management neurologist that starts with an L (lidocaine patch or Lyrica?).  She reports taking over-the-counter ibuprofen, say that she is not taking it every day.  When asking about her fall, said that she failed because of her ankle injury that happened in 2016.  She has been debilitated since then.  Initially patient reports mechanical fall and then reports syncope.  When asked about syncope, she said that when she feels tired, will lay down and sleep.  That was her definition of syncope.  She also reports feeling dizzy with positional changing.  Patient reports upper abdominal pain that started last year when she was in the hospital for H. pylori.  Said the pain never go away.  Unsure of triggering factor.  Say that is worse with food.  She endorses nausea without vomiting.  Also endorses constipation.  She is unsure if there are any blood in his stool.  Does not report any active source of bleeding.  Son also reports tremors, change in taste, trouble concentrating has been going on for a year.  She has not  been taking any of medications for at least a year.  She states that medication make her condition worse.  Patient also reports worsening of hearing that started 2-3 years ago.  Unsure of any preceding events.  Patient reports multiple falls that she hit her head in the past but has had hearing loss before this.  She denies alcohol or smoking.  Reports smoking cannabis because of the pain.  In the ED, CBC showed normal white count with a hemoglobin of 5.6.  Platelets is also low at 73 (was 125 in 2021).  CMP showed worsening creatinine of 7.01 from 6.39 in 2021, BUN 62, bicarb 13. Patient received 2 units of blood.  Nephrology was consulted   Meds:  No outpatient medications have been marked as taking for the 03/16/21 encounter [facility] Encounter).     Allergies: Allergies as of 03/16/2021 - Review Complete 03/16/2021  Allergen Reaction Noted   Ace inhibitors  07/31/2006   Percocet [oxycodone-acetaminophen] Itching 06/27/2014   Statins  03/05/2013   Latex Rash 10/21/2016   Sulfamethoxazole Rash 06/27/2014   Sulfites Rash 03/04/2012   Past Medical History:  Diagnosis Date   ANA POSITIVE 02/09/2010   Anal warts 11/09/2012   Anxiety    Arthritis    "my bones ache all the time" (06/27/2014)   Cellulitis 06/27/2014   medial proximal  left thigh extending to the perineium   Chronic bronchitis ([place])    "get it changing of the seasons; sometimes q yr; sometimes not"    Complication of anesthesia    sats drop post op   COPD (chronic obstructive pulmonary disease) ([place])    Deafness in 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"   Diabetic peripheral neuropathy ([place]) since <2005   "hands and feet"   Dizziness    Multiple falls at home   Fibromyalgia    Graves' disease    "they nuked it twice"   Headache(784.0)    "monthly" (06/27/2014)   Heart murmur    as a child-never had echo to evaluate   Hyperlipidemia    Hypertension    Hypothyroidism    Sickle cell trait ([place])     Trimalleolar fracture of left ankle 11/21/2014   Type 2 diabetes mellitus ([place])    Wears glasses     Family History:  Family History  Problem Relation Age of Onset   Emphysema Mother    Cancer Mother        unknown   Hypertension Mother    Cancer Father        pancreatic    Diabetes Father    Hypertension Father    Down syndrome Brother    Cancer Maternal Aunt        cancer   Cancer Other        breast cancer aunt   Stroke Sister    Diabetes Sister    Colon cancer Neg Hx    Stomach cancer Neg Hx    Esophageal cancer Neg Hx    Inflammatory bowel disease Neg Hx    Liver disease Neg Hx    Pancreatic cancer Neg Hx    Rectal cancer Neg Hx      Social History:  Lives by herself Denies alcohol or smoking Reports marijuana use  Review of Systems: A complete ROS was negative except as per HPI.   Physical Exam: Blood pressure (!) 183/76, pulse 68, temperature 98.1 F (36.7 C), temperature source Oral, resp. rate 16, last menstrual period 01/12/2014, SpO2 100 %. Physical Exam Constitutional:      General: She is not in acute distress.    Appearance: She is ill-appearing.     Comments: Patient is alert and awake.  She can hold a conversation appropriately.  HENT:     Head: Normocephalic and atraumatic.     Right Ear: There is impacted cerumen.     Left Ear: There is impacted cerumen.     Ears:     Comments: Very severe diminished hearing.  Cerumen impaction bilateral ear, left worse than right    Mouth/Throat:     Mouth: Mucous membranes are moist.  Eyes:     General: No scleral icterus.       Right eye: No discharge.        Left eye: No discharge.     Extraocular Movements: Extraocular movements intact.     Conjunctiva/sclera: Conjunctivae normal.     Pupils: Pupils are equal, round, and reactive to light.     Comments: Exophthalmos noted  Cardiovascular:     Rate and Rhythm: Normal rate and regular rhythm.     Heart sounds: Murmur (3/6 systolic murmur heard best  in the upper sternal border) heard.     Comments: No LE edema Pulmonary:     Effort: Pulmonary effort is normal. No respiratory distress.  Abdominal:  General: Bowel sounds are normal. There is no distension.     Palpations: Abdomen is soft.     Tenderness: There is abdominal tenderness (Tenderness to palpation in the gastric area). There is guarding.  Musculoskeletal:     Cervical back: Normal range of motion.     Comments: Chronic deformity of left ankle noted.  She has pain to palpation of bilateral ankle and feet.  Skin:    General: Skin is warm.     Coloration: Skin is not jaundiced.  Neurological:     Mental Status: She is alert.     Comments: Cranial nerves none deficit. Neuro exam was limited due to patient's fatigue.  However did not appreciate any focal neurological deficits.  Psychiatric:        Mood and Affect: Mood normal.        Thought Content: Thought content normal.        Judgment: Judgment normal.     CXR: personally reviewed my interpretation is no acute pathology.  No rib fracture.  Assessment & Plan by Problem: Active Problems:   CKD (chronic kidney disease) stage 5, GFR less than 15 ml/min (HCC)   Symptomatic anemia  Kayla [name] is a 60 year old female pretension, hyperlipidemia, type 2 diabetes, ESRD, hypothyroidism/Graves' disease, who presented to the ED for 2 weeks of right-sided back pain after a fall.  Also found to have symptomatic anemia with hemoglobin of 5.6 of unknown bleeding source and worsening renal function that may require dialysis.  Symptomatic anemia Hemoglobin of 5.6 on admission, patient received 2 units of blood.  Unclear source at this time.  She was admitted on 2020 for upper GI bleed, found to have H. pylori.  EGD on the admission reveals erosive esophagitis, gastritis and duodenal ulcer.  She was treated for H. pylori but did not have a stool eradication confirmation test.  Her anemia can be a combination of chronic kidney disease  vs upper GI bleed. -H&H posttransfusion -Check ferritin, iron study, B12 and folate -Check H. pylori stool test -CBC in a.m.  ESRD Hypertension In the setting of uncontrolled hypertension.  Patient has not been taking any medications for a long time nor follow-up with nephrology.  Nephrologist was consulted for this admission.  No urgent need for dialysis at this time.  She does have some uremic symptoms but still alert and awake. -Appreciate nephrology recommendation -Avoid nephrotoxic medications -Sodium bicarb for metabolic acidosis -Decision for dialysis per nephro -Resume amlodipine 5 mg  Right-sided back pain Likely musculoskeletal after the fall 2 weeks ago.  No rib fracture seen on chest x-ray.  No lumbar fracture on CT. CT abdomen showed bilateral punctate renal calculi that has been chronic since 2021.  Renal ultrasound show a nonobstructing 6 mm stone but on the left side. -Trial lidocaine patch for pain -Tylenol as needed  Hypothyroidism Patient reports history of Graves' disease status post multiple on ambulation.  Now has hypothyroidism which has been uncontrolled due to medication nonadherence.  Last TSH was 220.  Her uncontrolled hypothyroidism can explain symptoms of fatigue and generalized weakness. -Recheck TSH  Thrombocytopenia Patient has history of chronic thrombocytopenia since 2020. No history of liver disease or evidence of cirrhosis on CT. Likely secondary to IDA in combination with poor nutrition.  -Check PT/INR  -Check blood smear -CBC in AM   Recurrent fall Multifactorial.  Can be explained by uncontrolled hypothyroidism with longstanding anemia.  She also have severe diabetic neuropathy that affects her mobility.  No indication  for head CT without obvious focal neurological deficits on exam. -Treating the underlying cause -PT/OT  Hearing loss Likely due to bilateral cerumen impaction seen on physical exam.   -Will start Debrox with  irrigation.  Diabetes Last A1c of 5.2, can be falsely low in the setting of ESRD.  Patient was supposed to be on insulin but has not been taking her medications for a long time. -Check A1c -Sliding scale insulin  CODE STATUS full IV fluid: N/A DVT: Heparin Diet: Renal  Dispo: Admit patient to Inpatient with expected length of stay greater than 2 midnights.  Signed: Gaylan Gerold, DO 03/17/2021, 2:36 AM  Pager: [phone] After 5pm on weekdays and 1pm on weekends: On Call pager: [phone]

## 2021-03-17 NOTE — ED Notes (Signed)
Attempted to call report for pt transfer to [facility]. Told by secretary that nurse unavailable at this time will try again.

## 2021-03-17 NOTE — Therapy (Signed)
OT Cancellation Note  Patient Details Name: Kayla [name] MRN: [id number] DOB: Dec 24, 1960   Cancelled Treatment:    Reason Eval/Treat Not Completed: Other (comment) (Hgb of 5.1) Order received for OT initial evaluation. Session currently on hold due to pt's hgb level of 5.1. Protocol advises to hold session with hgb below 7.0. OT will continue to follow once pt is medically appropriate. Thank you.   Minus Breeding, MSOT, OTR/L  [facility]  [phone]   Marius Ditch 03/17/2021, 8:35 AM

## 2021-03-17 NOTE — Progress Notes (Signed)
  [facility] Progress Note   Assessment/ Plan:    CKD V: progressive CKD over the past several years.  Cr was 6.28 last year, now up to 7.0.  Hard to tell if her symptoms are truly uremia or if they are related to anemia.   - no emergent indication for dialysis  - transfuse pRBCs - correct acidosis - need to have continued dialogue with pt and her dtr about CKD V---> progression to ESRD   2.  Anemia: - getting transfused 2 u pRBCs - will check iron stores and add ESA--> start Aranesp today   3.  Back/ flank/ abd pain: - collecting urine culture - renal US- negative for obstruction   4.  DM II: - per primary   5.  Metabolic acidosis: - start Na bicarb   6.  HTN - add home meds  7.  Hypothyroidism - severe, TSH 161 - restart supplementation   8.  Dispo: being admitted  Subjective:    Getting pRBCs today, 2 u.  No complaints this AM.  Feels better.   Objective:   BP (!) 178/74   Pulse 61   Temp 98.4 F (36.9 C) (Oral)   Resp 17   LMP 01/12/2014   SpO2 100%   Physical Exam: GEN llying in bed, covers over face, awake and arousable HEENT EOMI PERRL NECK no JVD PULM clear CV RRR, III/VI murmur ABD soft EXT very tender to touch, no LE edema NEURO AAO x 3 no asterixis SKIN good turgor  Labs: BMET Recent Labs  Lab 03/16/21 1835 03/17/21 0438  NA 141 141  K 4.6 4.6  CL 116* 114*  CO2 13* 14*  GLUCOSE 91 103*  BUN 62* 59*  CREATININE 7.01* 7.06*  7.12*  CALCIUM 9.5 9.6  PHOS  --  4.5   CBC Recent Labs  Lab 03/16/21 1835 03/17/21 0438  WBC 8.2 7.8  NEUTROABS 6.5  --   HGB 5.6* 5.1*  HCT 17.0* 14.7*  MCV 97.1 94.8  PLT 73* 65*      Medications:     sodium chloride   Intravenous Once   amLODipine  5 mg Oral Daily   carbamide peroxide  5 drop Both EARS BID   famotidine  10 mg Oral Daily   hydrALAZINE  10 mg Oral Q8H   insulin aspart  0-6 Units Subcutaneous TID WC   lidocaine  1 patch Transdermal Q24H   sodium bicarbonate   1,300 mg Oral BID     Madelon Lips MD 03/17/2021, 10:16 AM

## 2021-03-17 NOTE — ED Notes (Signed)
[facility] at pt bedside for transfer to [facility]

## 2021-03-18 ENCOUNTER — Other Ambulatory Visit: Payer: Self-pay | Admitting: Internal Medicine

## 2021-03-18 DIAGNOSIS — E890 Postprocedural hypothyroidism: Secondary | ICD-10-CM

## 2021-03-18 LAB — RENAL FUNCTION PANEL
Albumin: 3.5 g/dL (ref 3.5–5.0)
Anion gap: 14 (ref 5–15)
BUN: 60 mg/dL — ABNORMAL HIGH (ref 6–20)
CO2: 15 mmol/L — ABNORMAL LOW (ref 22–32)
Calcium: 9.5 mg/dL (ref 8.9–10.3)
Chloride: 110 mmol/L (ref 98–111)
Creatinine, Ser: 6.84 mg/dL — ABNORMAL HIGH (ref 0.44–1.00)
GFR, Estimated: 6 mL/min — ABNORMAL LOW (ref >60)
Glucose, Bld: 133 mg/dL — ABNORMAL HIGH (ref 70–99)
Phosphorus: 4.6 mg/dL (ref 2.5–4.6)
Potassium: 4.4 mmol/L (ref 3.5–5.1)
Sodium: 139 mmol/L (ref 135–145)

## 2021-03-18 LAB — CBC WITH DIFFERENTIAL/PLATELET
Abs Immature Granulocytes: 0.12 10*3/uL — ABNORMAL HIGH (ref 0.00–0.07)
Basophils Absolute: 0.1 10*3/uL (ref 0.0–0.1)
Basophils Relative: 1 %
Eosinophils Absolute: 0.3 10*3/uL (ref 0.0–0.5)
Eosinophils Relative: 4 %
HCT: 30.9 % — ABNORMAL LOW (ref 36.0–46.0)
Hemoglobin: 10.5 g/dL — ABNORMAL LOW (ref 12.0–15.0)
Immature Granulocytes: 1 %
Lymphocytes Relative: 14 %
Lymphs Abs: 1.2 10*3/uL (ref 0.7–4.0)
MCH: 30.1 pg (ref 26.0–34.0)
MCHC: 34 g/dL (ref 30.0–36.0)
MCV: 88.5 fL (ref 80.0–100.0)
Monocytes Absolute: 0.4 10*3/uL (ref 0.1–1.0)
Monocytes Relative: 5 %
Neutro Abs: 6.3 10*3/uL (ref 1.7–7.7)
Neutrophils Relative %: 75 %
Platelets: 85 10*3/uL — ABNORMAL LOW (ref 150–400)
RBC: 3.49 MIL/uL — ABNORMAL LOW (ref 3.87–5.11)
RDW: 15.8 % — ABNORMAL HIGH (ref 11.5–15.5)
WBC: 8.4 10*3/uL (ref 4.0–10.5)
nRBC: 0.2 % (ref 0.0–0.2)

## 2021-03-18 LAB — TYPE AND SCREEN
ABO/RH(D): O POS
Antibody Screen: NEGATIVE
Unit division: 0
Unit division: 0

## 2021-03-18 LAB — GLUCOSE, CAPILLARY
Glucose-Capillary: 165 mg/dL — ABNORMAL HIGH (ref 70–99)
Glucose-Capillary: 109 mg/dL — ABNORMAL HIGH (ref 70–99)

## 2021-03-18 LAB — URINALYSIS, ROUTINE W REFLEX MICROSCOPIC
Bilirubin Urine: NEGATIVE
Glucose, UA: NEGATIVE mg/dL
Ketones, ur: NEGATIVE mg/dL
Leukocytes,Ua: NEGATIVE
Nitrite: NEGATIVE
Protein, ur: >300 mg/dL — AB
Specific Gravity, Urine: 1.02 (ref 1.005–1.030)
pH: 7.5 (ref 5.0–8.0)

## 2021-03-18 LAB — BPAM RBC
Blood Product Expiration Date: 202207142359
Blood Product Expiration Date: 202207142359
ISSUE DATE / TIME: 202206180725
ISSUE DATE / TIME: 202206181104
Unit Type and Rh: 5100
Unit Type and Rh: 5100

## 2021-03-18 LAB — URINALYSIS, MICROSCOPIC (REFLEX)

## 2021-03-18 MED ORDER — HYDRALAZINE HCL 50 MG PO TABS: 50.0000 mg | ORAL_TABLET | Freq: Three times a day (TID) | ORAL | Status: DC

## 2021-03-18 MED ORDER — HYDRALAZINE HCL 25 MG PO TABS
25.0000 mg | ORAL_TABLET | Freq: Three times a day (TID) | ORAL | Status: DC
  Administered 2021-03-18 – 2021-03-19 (×4): 25 mg via ORAL
  Filled 2021-03-18 (×4): qty 1

## 2021-03-18 NOTE — Evaluation (Signed)
Occupational Therapy Evaluation Patient Details Name: Kayla [name] MRN: [id number] DOB: 12-Oct-1960 Today's Date: 03/18/2021    History of Present Illness Pt is a 60 y/o female presenting with tremors, poor concentration, diffuse pain, and recent fall with questionable syncope. Pt found to have acute > chronic anemia and progression of CKD to ESRD. Pt admitted for symptomatic anemia.  PMH: hypertension, hyperlipidemia, type 2 diabetes, CKD 5, and chronic uncontrolled hypothyroidism 2/2 treatment for Graves disease.   Clinical Impression   Pt admitted to ED for concerns listed above. PTA pt reported that she required assist from her family for dressing and bathing, and typically furniture walked in her house, despite having a RW. Pt demonstrating increased weakness and decreased activity tolerance and awarness for her safety. Pt attempting to furniture walk, despite OT educating her on HHA. Pt mobility very unstable, becoming more unstable as pt begins to tremor with fatigue. Pt overall requiring min guard to mod A for all ADL's and mobility. Acute OT will follow up as time allows to address concerns listed below.     Follow Up Recommendations  Home health OT;Supervision/Assistance - 24 hour (Pt would benefit from SNF, however is refusing at this time.)    Equipment Recommendations  None recommended by OT    Recommendations for Other Services       Precautions / Restrictions Precautions Precautions: Fall Restrictions Weight Bearing Restrictions: No      Mobility Bed Mobility Overal bed mobility: Needs Assistance Bed Mobility: Supine to Sit;Sit to Supine     Supine to sit: Min guard;HOB elevated Sit to supine: Min guard;HOB elevated        Transfers Overall transfer level: Needs assistance Equipment used: Rolling walker (2 wheeled) Transfers: Sit to/from Stand Sit to Stand: Min guard         General transfer comment: min G for safety    Balance Overall balance  assessment: Needs assistance Sitting-balance support: Feet supported Sitting balance-Leahy Scale: Fair Sitting balance - Comments: Pt requiring external support and UE support.   Standing balance support: During functional activity;Bilateral upper extremity supported Standing balance-Leahy Scale: Poor Standing balance comment: reliant on UE support and at times external support                           ADL either performed or assessed with clinical judgement   ADL Overall ADL's : Needs assistance/impaired Eating/Feeding: Set up;Sitting   Grooming: Wash/dry hands;Wash/dry face;Set up;Sitting   Upper Body Bathing: Minimal assistance;Sitting   Lower Body Bathing: Moderate assistance;Maximal assistance;Sitting/lateral leans;Sit to/from stand   Upper Body Dressing : Minimal assistance;Sitting   Lower Body Dressing: Moderate assistance;Maximal assistance;Sitting/lateral leans;Sit to/from stand   Toilet Transfer: Moderate assistance;Ambulation Toilet Transfer Details (indicate cue type and reason): Short distances, lots of verbal cueing Toileting- Clothing Manipulation and Hygiene: Minimal assistance;Sitting/lateral lean;Sit to/from stand   Tub/ Shower Transfer: Moderate assistance;Ambulation Tub/Shower Transfer Details (indicate cue type and reason): Short distances and lots of verbal cueing for safety Functional mobility during ADLs: Minimal assistance;Moderate assistance;Rolling walker General ADL Comments: Pt requiring verbal cues to attend to tasks, as well as     Vision Baseline Vision/History: Cataracts Patient Visual Report: No change from baseline Vision Assessment?: No apparent visual deficits     Perception Perception Perception Tested?: No   Praxis Praxis Praxis tested?: Not tested    Pertinent Vitals/Pain Pain Assessment: 0-10 Pain Score: 10-Worst pain ever Pain Location: abdominal and back Pain Descriptors / Indicators:  Grimacing;Discomfort Pain  Intervention(s): Limited activity within patient's tolerance;Monitored during session;Repositioned     Hand Dominance Right   Extremity/Trunk Assessment Upper Extremity Assessment Upper Extremity Assessment: Generalized weakness   Lower Extremity Assessment Lower Extremity Assessment: Defer to PT evaluation   Cervical / Trunk Assessment Cervical / Trunk Assessment: Normal   Communication Communication Communication: HOH   Cognition Arousal/Alertness: Awake/alert Behavior During Therapy: WFL for tasks assessed/performed Overall Cognitive Status: No family/caregiver present to determine baseline cognitive functioning                                 General Comments: Pt demonstrates poor safety awareness during session, including simultaneously trying to hold on to therapist and holding on to walls/furniture with R UE. Pt continues to attempt to ambulate when encouraged to rest as instability is noted with onset of fatigue   General Comments  Pt reported dizziness after ambulation, OT returned pt to supine. BP 124/64.    Exercises     Shoulder Instructions      Home Living Family/patient expects to be discharged to:: Private residence Living Arrangements: Alone Available Help at Discharge: Family;Available PRN/intermittently Type of Home: Apartment Home Access: Stairs to enter Entrance Stairs-Number of Steps: 4-5 Entrance Stairs-Rails: Right Home Layout: One level     Bathroom Shower/Tub: Teacher, early years/pre: Handicapped height Bathroom Accessibility: Yes How Accessible: Accessible via walker Home Equipment: [place] - 2 wheels;Shower seat          Prior Functioning/Environment Level of Independence: Needs assistance  Gait / Transfers Assistance Needed: Pt ambulates with RW in the home. Reports not leaving the home often ADL's / Homemaking Assistance Needed: Pt receives assistance from daughter/granddaughter to dress/bathe.   Comments:  Pt reports when her daughter/granddaughter don't come see her, she either doesn't wear clothes or keeps her "dirty clothes" on until they do come.        OT Problem List: Decreased strength;Decreased range of motion;Decreased activity tolerance;Impaired balance (sitting and/or standing);Decreased coordination;Decreased cognition;Decreased safety awareness;Decreased knowledge of use of DME or AE      OT Treatment/Interventions: Self-care/ADL training;Therapeutic exercise;Energy conservation;DME and/or AE instruction;Cognitive remediation/compensation;Patient/family education;Balance training    OT Goals(Current goals can be found in the care plan section) Acute Rehab OT Goals Patient Stated Goal: return home OT Goal Formulation: With patient Time For Goal Achievement: 04/01/21 Potential to Achieve Goals: Fair ADL Goals Pt Will Perform Grooming: with supervision;standing Pt Will Perform Lower Body Bathing: with min guard assist;sit to/from stand;sitting/lateral leans;with adaptive equipment Pt Will Perform Lower Body Dressing: with supervision;with adaptive equipment;sitting/lateral leans;sit to/from stand Pt Will Transfer to Toilet: with supervision;ambulating  OT Frequency: Min 2X/week   Barriers to D/C: Decreased caregiver support  Pt lives alone, requiring min guard for all OOB activities at this time.       Co-evaluation              AM-PAC OT "6 Clicks" Daily Activity     Outcome Measure Help from another person eating meals?: None Help from another person taking care of personal grooming?: A Little Help from another person toileting, which includes using toliet, bedpan, or urinal?: A Little Help from another person bathing (including washing, rinsing, drying)?: A Lot Help from another person to put on and taking off regular upper body clothing?: A Little Help from another person to put on and taking off regular lower body clothing?: A Lot 6 Click Score:  17   End of  Session Equipment Utilized During Treatment: Gait belt Nurse Communication: Mobility status  Activity Tolerance: Patient limited by fatigue Patient left: in bed;with call bell/phone within reach;with bed alarm set  OT Visit Diagnosis: Other abnormalities of gait and mobility (R26.89);Muscle weakness (generalized) (M62.81);Repeated falls (R29.6)                Time: [id number] OT Time Calculation (min): 34 min Charges:  OT General Charges $OT Visit: 1 Visit OT Evaluation $OT Eval Moderate Complexity: 1 Mod OT Treatments $Self Care/Home Management : 8-22 mins  Garret Teale H., OTR/L Acute Rehabilitation  Gurpreet Mariani Elane Brailey Buescher 03/18/2021, 3:19 PM

## 2021-03-18 NOTE — Plan of Care (Signed)
  Problem: Education: Goal: Knowledge of disease and its progression will improve Outcome: Progressing   Problem: Fluid Volume: Goal: Compliance with measures to maintain balanced fluid volume will improve Outcome: Progressing   Problem: Health Behavior/Discharge Planning: Goal: Ability to manage health-related needs will improve Outcome: Progressing

## 2021-03-18 NOTE — Progress Notes (Signed)
Subjective:   Overnight events: Overnight, Ms. Lueras noted that she developed jitteriness and palpitations after she received her levothyroxine dose.  Our night team evaluated the patient at bedside at that time.  Physical examination was reassuring.  This morning, Ms. Covington is very upset regarding her poor reaction to levothyroxine and she states that she would rather " deal with the pain" then take the medication again.  She notes a long-term intolerance of levothyroxine, but states she understands why thyroid replacement therapy is very important.  We discussed that there are serious and life-threatening complications that can occur without thyroid replacement Ms. Gienger expressed understanding.  We discussed a plan to have her follow-up with endocrinology outpatient.  Otherwise Ms. Radovic states that she still cannot hear well due to increased earwax.  She denies any other acute complaints at this time including shortness of breath, chest pain, palpitations.  Objective:  Vital signs in last 24 hours: Vitals:   03/17/21 2100 03/18/21 0356 03/18/21 0549 03/18/21 1100  BP:   (!) 151/73 (!) 156/87  Pulse:   73   Resp:   18 16  Temp:   98.4 F (36.9 C) 98.3 F (36.8 C)  TempSrc:   Oral Oral  SpO2:   100% 99%  Weight: 58.7 kg 59.2 kg     Physical Exam Vitals and nursing note reviewed.  Constitutional:      General: She is not in acute distress.    Appearance: She is normal weight.  HENT:     Head: Normocephalic and atraumatic.     Right Ear: Decreased hearing noted.     Left Ear: Decreased hearing noted.     Mouth/Throat:     Mouth: Mucous membranes are moist.     Pharynx: Oropharynx is clear.  Pulmonary:     Effort: Pulmonary effort is normal. No respiratory distress.  Skin:    General: Skin is warm and dry.  Neurological:     General: No focal deficit present.     Mental Status: She is alert and oriented to person, place, and time.  Psychiatric:        Mood and Affect:  Mood normal.        Behavior: Behavior normal.        Thought Content: Thought content normal.        Judgment: Judgment normal.   Assessment/Plan:  Active Problems:   CKD (chronic kidney disease) stage 5, GFR less than 15 ml/min (HCC)   Symptomatic anemia  Adelin Brodman is a 60 year old female pretension, hyperlipidemia, type 2 diabetes, ESRD, hypothyroidism/Graves' disease, who presented to the ED for 2 weeks of right-sided back pain after a fall.  Also found to have symptomatic anemia with hemoglobin of 5.6 of unknown bleeding source and worsening renal function that may require dialysis.   # Symptomatic anemia # Hx of Sickle Cell Trait  Symptomatic anemia likely in the setting of ESRD as there is no indication of acute bleed at this time.  Hemoglobin showed overcorrection after 2 units of packed red blood cells from approximately 5-10.  Hemoglobin is stable at this time.  We will continue to monitor while admitted.  -Aranesp started yesterday -Check H. pylori stool test   # ESRD ESRD secondary to uncontrolled hypertension progressive over the past several years.  Nephrology has evaluated patient today and recommends continued sodium bicarb for acidosis and outpatient follow-up to continue the discussions regarding dialysis.  No indication at this time for emergent initiation.  -Appreciate  nephrology recommendations -Avoid nephrotoxic medications -Sodium bicarb for metabolic acidosis  # Hypertension Patient's blood pressure continues to be above goal at 151/73.  Amlodipine was increased yesterday as well as hydralazine.  Due to these frequent changes, we will continue with current management at this time.  Will likely need additional titration in the outpatient setting.  -Continue amlodipine 10 mg daily -Continue hydralazine 25 mg 3 times daily   # Acquired Hypothyroidism # Grave's Disease Patient reports history of Graves' disease status post multiple on ambulation.  TSH has  been markedly elevated for at least 2 years now.  Reevaluated on this admission elevated at 161; T4 level is < 0.25.  Levothyroxine was given yesterday and patient had a poor reaction; I suspect that she will be permanently levothyroxine intolerant.  She may benefit from a T3/T4 combination such as Armour, however feel that she would benefit from endocrinology starting this medication.  -Outpatient endocrinology follow-up   # Thrombocytopenia Patient has an intermittent history of single episodes of thrombocytopenia in the past, however current thrombocytopenia is the most severe its been. No known hx of hepatic dysfunction or abnormal liver on imaging; INR is unremarkable.  No evidence of acute hemolysis.  This is likely in the setting of ESRD.  -Continue to trend platelets while admitted  # Right-sided back pain Likely musculoskeletal after the fall 2 weeks ago.   -Continue lidocaine patch for pain   # Type 2 Diabetes Mellitus  - Hgb A1c pending  - Sliding scale insulin  # Systolic Murmur - Echo ordered and pending  # Recurrent falls Multifactorial in the setting of uncontrolled hypothyroidism with longstanding anemia.  She also have severe diabetic neuropathy that affects her mobility.   -PT/OT evaluation pending    # Bilateral Hearing loss Likely due to bilateral cerumen impaction seen on physical exam.  Cerumen disimpaction unsuccessful yesterday afternoon.  Will need further washout in the outpatient setting.  No further intervention at this time.  # COPD  - Albuterol PRN  CODE STATUS Full IV fluid: N/A DVT: Heparin Diet: Renal  Prior to Admission Living Arrangement: Home Anticipated Discharge Location: TBD, pending PT/OT recommendations  Barriers to Discharge: Continued medical evaluation and stabilization  Dispo: Anticipated discharge tomorrow  Dr. Jose Persia Internal Medicine PGY-2  Pager: [phone] After 5pm on weekdays and 1pm on weekends: On Call pager  [phone]  03/18/2021, 12:42 PM

## 2021-03-18 NOTE — Progress Notes (Signed)
  [facility] Progress Note   Assessment/ Plan:    CKD V: progressive CKD over the past several years.  Cr was 6.28 last year, now up to 7.0.  Hard to tell if her symptoms are truly uremia or if they are related to anemia.   - no emergent indication for dialysis  - transfused pRBCs - correcting acidosis - need to have continued dialogue with pt and her dtr about CKD V---> progression to ESRD - suggest pall care c/c   2.  Anemia: - getting transfused 2 u pRBCs - will check iron stores and add ESA--> started Aranesp 60 q Saturday   3.  Back/ flank/ abd pain: - collecting urine culture - renal US- negative for obstruction   4.  DM II: - per primary   5.  Metabolic acidosis: - start Na bicarb   6.  HTN - add home meds  7.  Hypothyroidism - severe, TSH 161 - restart supplementation   8.  Dispo: admitted  Subjective:    Working with PT today.  Felt jittery after levothyroixine administration yesterday.     Objective:   BP (!) 151/73 (BP Location: Left Arm)   Pulse 73   Temp 98.4 F (36.9 C) (Oral)   Resp 18   Wt 59.2 kg   LMP 01/12/2014   SpO2 100%   BMI 23.87 kg/m   Physical Exam: GEN llying in bed, covers over face, awake and arousable HEENT EOMI PERRL NECK no JVD PULM clear CV RRR, III/VI murmur ABD soft EXT very tender to touch, no LE edema NEURO AAO x 3 no asterixis SKIN good turgor  Labs: BMET Recent Labs  Lab 03/16/21 1835 03/17/21 0438 03/18/21 0255  NA 141 141 139  K 4.6 4.6 4.4  CL 116* 114* 110  CO2 13* 14* 15*  GLUCOSE 91 103* 133*  BUN 62* 59* 60*  CREATININE 7.01* 7.06*  7.12* 6.84*  CALCIUM 9.5 9.6 9.5  PHOS  --  4.5 4.6   CBC Recent Labs  Lab 03/16/21 1835 03/17/21 0438 03/17/21 1554 03/18/21 0255  WBC 8.2 7.8 8.7 8.4  NEUTROABS 6.5  --   --  6.3  HGB 5.6* 5.1* 10.5* 10.5*  HCT 17.0* 14.7* 30.1* 30.9*  MCV 97.1 94.8 89.1 88.5  PLT 73* 65* 78* 85*      Medications:     sodium chloride    Intravenous Once   amLODipine  10 mg Oral Daily   carbamide peroxide  5 drop Both EARS BID   darbepoetin (ARANESP) injection - NON-DIALYSIS  60 mcg Subcutaneous Q Sat-1800   famotidine  10 mg Oral Daily   hydrALAZINE  25 mg Oral Q8H   insulin aspart  0-6 Units Subcutaneous TID WC   lidocaine  1 patch Transdermal Q24H   sodium bicarbonate  1,300 mg Oral BID     Madelon Lips MD 03/18/2021, 9:55 AM

## 2021-03-18 NOTE — Evaluation (Signed)
Physical Therapy Evaluation Patient Details Name: Kayla [name] MRN: [id number] DOB: 04-27-1961 Today's Date: 03/18/2021   History of Present Illness  Pt is a 60 y/o female presenting with tremors, poor concentration, diffuse pain, and recent fall with questionable syncope. Pt found to have acute > chronic anemia and progression of CKD to ESRD. Pt admitted for symptomatic anemia.  PMH: hypertension, hyperlipidemia, type 2 diabetes, CKD 5, and chronic uncontrolled hypothyroidism 2/2 treatment for Graves disease.  Clinical Impression  Pt demonstrates deficits in safety awareness, strength, activity tolerance, functional mobility, balance, and gait increasing the pt's risk of falls. Pt reports an increase in dizziness with positional changes during session, BP taken and stable. Pt tolerates ambulation of household distances with RW and reaching out to furniture and railing in hall for additional stability. Pt encouraged to take a seated rest break as she demonstrates increased WOB and moderate instability with gait. Pt will benefit from acute PT to improve safety and functional mobility. SPT recommends SNF placement to reduce falls risk and improve independence in mobility. If pt refuses SNF placement, HHPT with supervision for all OOB mobility.    Follow Up Recommendations SNF;Supervision for mobility/OOB (If pt refuses, HHPT with supervision for all OOB mobiity)    Equipment Recommendations  3in1 (PT);Wheelchair cushion (measurements PT);Wheelchair (measurements PT)    Recommendations for Other Services       Precautions / Restrictions Precautions Precautions: Fall Restrictions Weight Bearing Restrictions: No      Mobility  Bed Mobility Overal bed mobility: Needs Assistance Bed Mobility: Supine to Sit     Supine to sit: Min guard;HOB elevated          Transfers Overall transfer level: Needs assistance Equipment used: Rolling walker (2 wheeled) Transfers: Sit to/from Stand Sit  to Stand: Min guard         General transfer comment: min G for safety  Ambulation/Gait Ambulation/Gait assistance: Min guard Gait Distance (Feet): 40 Feet (x 2) Assistive device: Rolling walker (2 wheeled) Gait Pattern/deviations: Step-through pattern;Wide base of support;Trunk flexed Gait velocity: reduced Gait velocity interpretation: 1.31 - 2.62 ft/sec, indicative of limited community ambulator General Gait Details: slow step-through gait with instabiltiy and bil LE externally rotated. Pt attempting to hold on to furniture while using RW  Stairs            Wheelchair Mobility    Modified Rankin (Stroke Patients Only)       Balance Overall balance assessment: Needs assistance Sitting-balance support: Feet supported Sitting balance-Leahy Scale: Poor Sitting balance - Comments: Pt requiring external support and UE support.   Standing balance support: During functional activity;Bilateral upper extremity supported Standing balance-Leahy Scale: Poor Standing balance comment: reliant on UE support.                             Pertinent Vitals/Pain Pain Assessment: 0-10 Pain Score: 9  Pain Location: abdominal and back Pain Descriptors / Indicators: Grimacing;Discomfort Pain Intervention(s): Monitored during session;Limited activity within patient's tolerance    Home Living Family/patient expects to be discharged to:: Private residence Living Arrangements: Alone Available Help at Discharge: Family;Available PRN/intermittently Type of Home: Apartment Home Access: Stairs to enter Entrance Stairs-Rails: Right Entrance Stairs-Number of Steps: 4-5 Home Layout: One level Home Equipment: Walker - 2 wheels;Shower seat      Prior Function Level of Independence: Needs assistance   Gait / Transfers Assistance Needed: Pt ambulates with RW in the home. Reports not leaving the  home often  ADL's / Homemaking Assistance Needed: Pt receives assistance from  daughter/granddaughter to dress/bathe.        Hand Dominance        Extremity/Trunk Assessment   Upper Extremity Assessment Upper Extremity Assessment: Generalized weakness    Lower Extremity Assessment Lower Extremity Assessment: Generalized weakness    Cervical / Trunk Assessment Cervical / Trunk Assessment: Normal  Communication   Communication: HOH  Cognition Arousal/Alertness: Awake/alert Behavior During Therapy: WFL for tasks assessed/performed Overall Cognitive Status: No family/caregiver present to determine baseline cognitive functioning                                 General Comments: Pt demonstrates poor safety awareness during session, including simultaneously trying to manage walker and holding on to walls/furniture with R UE. Pt continues to attempt to ambulate when encouraged to rest as instability is noted with onset of fatigue      General Comments General comments (skin integrity, edema, etc.): Pt reporting worsening dizziness with positional changes. BP supine- 128/67, HR- 72; BP sitting- 121/59, HR- 76;  BP standing- 115/62, HR- 81.    Exercises     Assessment/Plan    PT Assessment Patient needs continued PT services  PT Problem List Decreased strength;Decreased activity tolerance;Decreased balance;Decreased mobility;Decreased knowledge of use of DME;Decreased safety awareness;Decreased knowledge of precautions;Pain       PT Treatment Interventions DME instruction;Gait training;Functional mobility training;Stair training;Therapeutic activities;Therapeutic exercise;Balance training;Patient/family education    PT Goals (Current goals can be found in the Care Plan section)  Acute Rehab PT Goals Patient Stated Goal: return home PT Goal Formulation: With patient Time For Goal Achievement: 04/01/21 Potential to Achieve Goals: Good    Frequency Min 2X/week   Barriers to discharge        Co-evaluation               AM-PAC  PT "6 Clicks" Mobility  Outcome Measure Help needed turning from your back to your side while in a flat bed without using bedrails?: None Help needed moving from lying on your back to sitting on the side of a flat bed without using bedrails?: A Little Help needed moving to and from a bed to a chair (including a wheelchair)?: A Little Help needed standing up from a chair using your arms (e.g., wheelchair or bedside chair)?: A Little Help needed to walk in hospital room?: A Little Help needed climbing 3-5 steps with a railing? : A Lot 6 Click Score: 18    End of Session Equipment Utilized During Treatment: Gait belt Activity Tolerance: Patient limited by fatigue Patient left: in chair;with call bell/phone within reach;with chair alarm set;with family/visitor present Nurse Communication: Mobility status PT Visit Diagnosis: Unsteadiness on feet (R26.81);Other abnormalities of gait and mobility (R26.89);Muscle weakness (generalized) (M62.81);Difficulty in walking, not elsewhere classified (R26.2);Dizziness and giddiness (R42);Pain Pain - Right/Left:  (back, abdominal) Pain - part of body:  (back, abdominal)    Time: [id number] PT Time Calculation (min) (ACUTE ONLY): 37 min   Charges:   PT Evaluation $PT Eval Low Complexity: 1 Low          Acute Rehab  Pager: [phone]   Garwin Brothers, SPT  03/18/2021, 1:52 PM

## 2021-03-18 NOTE — Plan of Care (Signed)
  Problem: Education: Goal: Knowledge of disease and its progression will improve Outcome: Progressing   Problem: Clinical Measurements: Goal: Complications related to the disease process, condition or treatment will be avoided or minimized Outcome: Progressing   

## 2021-03-19 ENCOUNTER — Other Ambulatory Visit (HOSPITAL_COMMUNITY): Payer: Self-pay

## 2021-03-19 ENCOUNTER — Inpatient Hospital Stay (HOSPITAL_COMMUNITY): Payer: Self-pay

## 2021-03-19 DIAGNOSIS — R011 Cardiac murmur, unspecified: Secondary | ICD-10-CM

## 2021-03-19 DIAGNOSIS — E119 Type 2 diabetes mellitus without complications: Secondary | ICD-10-CM

## 2021-03-19 DIAGNOSIS — M797 Fibromyalgia: Secondary | ICD-10-CM

## 2021-03-19 DIAGNOSIS — N185 Chronic kidney disease, stage 5: Secondary | ICD-10-CM

## 2021-03-19 DIAGNOSIS — R296 Repeated falls: Secondary | ICD-10-CM

## 2021-03-19 DIAGNOSIS — I10 Essential (primary) hypertension: Secondary | ICD-10-CM

## 2021-03-19 DIAGNOSIS — D649 Anemia, unspecified: Secondary | ICD-10-CM

## 2021-03-19 LAB — ECHOCARDIOGRAM COMPLETE
AR max vel: 2.49 cm2
AV Area VTI: 2.53 cm2
AV Area mean vel: 2.24 cm2
AV Mean grad: 9 mmHg
AV Peak grad: 15.5 mmHg
Ao pk vel: 1.97 m/s
Area-P 1/2: 3.37 cm2
S' Lateral: 2.9 cm
Weight: 2179.91 oz

## 2021-03-19 LAB — CBC WITH DIFFERENTIAL/PLATELET
Abs Immature Granulocytes: 0.15 10*3/uL — ABNORMAL HIGH (ref 0.00–0.07)
Basophils Absolute: 0.1 10*3/uL (ref 0.0–0.1)
Basophils Relative: 1 %
Eosinophils Absolute: 0.3 10*3/uL (ref 0.0–0.5)
Eosinophils Relative: 3 %
HCT: 27.1 % — ABNORMAL LOW (ref 36.0–46.0)
Hemoglobin: 9.2 g/dL — ABNORMAL LOW (ref 12.0–15.0)
Immature Granulocytes: 2 %
Lymphocytes Relative: 19 %
Lymphs Abs: 1.6 10*3/uL (ref 0.7–4.0)
MCH: 30.1 pg (ref 26.0–34.0)
MCHC: 33.9 g/dL (ref 30.0–36.0)
MCV: 88.6 fL (ref 80.0–100.0)
Monocytes Absolute: 0.5 10*3/uL (ref 0.1–1.0)
Monocytes Relative: 6 %
Neutro Abs: 5.9 10*3/uL (ref 1.7–7.7)
Neutrophils Relative %: 69 %
Platelets: 86 10*3/uL — ABNORMAL LOW (ref 150–400)
RBC: 3.06 MIL/uL — ABNORMAL LOW (ref 3.87–5.11)
RDW: 15.9 % — ABNORMAL HIGH (ref 11.5–15.5)
WBC: 8.5 10*3/uL (ref 4.0–10.5)
nRBC: 0 % (ref 0.0–0.2)

## 2021-03-19 LAB — BASIC METABOLIC PANEL
Anion gap: 9 (ref 5–15)
BUN: 59 mg/dL — ABNORMAL HIGH (ref 6–20)
CO2: 16 mmol/L — ABNORMAL LOW (ref 22–32)
Calcium: 9.2 mg/dL (ref 8.9–10.3)
Chloride: 111 mmol/L (ref 98–111)
Creatinine, Ser: 6.95 mg/dL — ABNORMAL HIGH (ref 0.44–1.00)
GFR, Estimated: 6 mL/min — ABNORMAL LOW (ref >60)
Glucose, Bld: 128 mg/dL — ABNORMAL HIGH (ref 70–99)
Potassium: 4.1 mmol/L (ref 3.5–5.1)
Sodium: 136 mmol/L (ref 135–145)

## 2021-03-19 LAB — URINE CULTURE: Culture: 90000 — AB

## 2021-03-19 LAB — GLUCOSE, CAPILLARY
Glucose-Capillary: 126 mg/dL — ABNORMAL HIGH (ref 70–99)
Glucose-Capillary: 166 mg/dL — ABNORMAL HIGH (ref 70–99)
Glucose-Capillary: 145 mg/dL — ABNORMAL HIGH (ref 70–99)

## 2021-03-19 LAB — PATHOLOGIST SMEAR REVIEW

## 2021-03-19 MED ORDER — TOPIRAMATE 25 MG PO TABS
25.0000 mg | ORAL_TABLET | Freq: Every day | ORAL | 2 refills | Status: DC
  Filled 2021-03-19: qty 30, 30d supply, fill #0

## 2021-03-19 MED ORDER — AMLODIPINE BESYLATE 10 MG PO TABS
10.0000 mg | ORAL_TABLET | Freq: Every day | ORAL | 0 refills | Status: DC
  Filled 2021-03-19: qty 30, 30d supply, fill #0

## 2021-03-19 MED ORDER — TOPIRAMATE 25 MG PO CPSP
25.0000 mg | ORAL_CAPSULE | Freq: Every day | ORAL | 3 refills | Status: DC
  Filled 2021-03-19: qty 30, 30d supply, fill #0

## 2021-03-19 MED ORDER — HYDROMORPHONE HCL 2 MG PO TABS
2.0000 mg | ORAL_TABLET | Freq: Three times a day (TID) | ORAL | 0 refills | Status: AC | PRN
  Filled 2021-03-19: qty 15, 5d supply, fill #0

## 2021-03-19 MED ORDER — DARBEPOETIN ALFA 60 MCG/0.3ML IJ SOSY: 60.0000 ug | PREFILLED_SYRINGE | INTRAMUSCULAR | Status: AC

## 2021-03-19 MED ORDER — SODIUM BICARBONATE 650 MG PO TABS
1300.0000 mg | ORAL_TABLET | Freq: Two times a day (BID) | ORAL | 0 refills | Status: DC
  Filled 2021-03-19: qty 120, 30d supply, fill #0

## 2021-03-19 MED ORDER — ACETAMINOPHEN 325 MG PO TABS: 650.0000 mg | ORAL_TABLET | Freq: Four times a day (QID) | ORAL | Status: AC | PRN

## 2021-03-19 MED ORDER — HYDRALAZINE HCL 25 MG PO TABS
25.0000 mg | ORAL_TABLET | Freq: Three times a day (TID) | ORAL | 0 refills | Status: DC
  Filled 2021-03-19: qty 90, 30d supply, fill #0

## 2021-03-19 MED ORDER — VENLAFAXINE HCL ER 37.5 MG PO CP24
37.5000 mg | ORAL_CAPSULE | Freq: Every day | ORAL | 0 refills | Status: DC
  Filled 2021-03-19: qty 30, 30d supply, fill #0

## 2021-03-19 NOTE — Progress Notes (Addendum)
CBG not tranfering over  current 126

## 2021-03-19 NOTE — Progress Notes (Signed)
[facility] ROUNDING NOTE   Subjective:   Interval History: 60 year old lady with history of progressive renal insufficiency with progression to end-stage renal disease.  Hypothyroidism status post Graves' disease.  Found to have symptomatic anemia.  No emergent needs for dialysis patient contemplating starting dialysis.  Blood pressure 130 762 pulse 71 temperature 98.1 O2 sats 99% room air  Sodium 139 potassium 4.4 chloride 110 CO2 15 BUN 60 creatinine 6.84 glucose 133 hemoglobin 10.5  Objective:  Vital signs in last 24 hours:  Temp:  [98 F (36.7 C)-98.3 F (36.8 C)] 98.1 F (36.7 C) (06/20 0357) Pulse Rate:  [71-73] 71 (06/20 0357) Resp:  [16-17] 16 (06/20 0357) BP: (137-156)/(62-87) 137/62 (06/20 0357) SpO2:  [99 %-100 %] 99 % (06/20 0357) Weight:  [63.5 kg] 63.5 kg (06/19 2100)  Weight change: 4.8 kg Filed Weights   03/17/21 2100 03/18/21 0356 03/18/21 2100  Weight: 58.7 kg 59.2 kg 63.5 kg    Intake/Output: I/O last 3 completed shifts: In: 600 [P.O.:600] Out: 800 [Urine:800]   Intake/Output this shift:  No intake/output data recorded.  GEN llying in bed, covers over face, awake and arousable HEENT EOMI PERRL NECK no JVD PULM clear CV RRR, III/VI murmur ABD soft EXT very tender to touch, no LE edema NEURO AAO x 3 no asterixis SKIN good turgor   Basic Metabolic Panel: Recent Labs  Lab 03/16/21 1835 03/17/21 0438 03/18/21 0255  NA 141 141 139  K 4.6 4.6 4.4  CL 116* 114* 110  CO2 13* 14* 15*  GLUCOSE 91 103* 133*  BUN 62* 59* 60*  CREATININE 7.01* 7.06*  7.12* 6.84*  CALCIUM 9.5 9.6 9.5  MG  --  1.7  --   PHOS  --  4.5 4.6    Liver Function Tests: Recent Labs  Lab 03/16/21 1835 03/17/21 0438 03/17/21 1554 03/18/21 0255  AST 21 22  --   --   ALT 12 12  --   --   ALKPHOS 93 92  --   --   BILITOT 1.8* 1.9* 2.4*  --   PROT 7.1 6.6  --   --   ALBUMIN 4.0 3.6  --  3.5   No results for input(s): LIPASE, AMYLASE in the last 168  hours. No results for input(s): AMMONIA in the last 168 hours.  CBC: Recent Labs  Lab 03/16/21 1835 03/17/21 0438 03/17/21 1554 03/18/21 0255  WBC 8.2 7.8 8.7 8.4  NEUTROABS 6.5  --   --  6.3  HGB 5.6* 5.1* 10.5* 10.5*  HCT 17.0* 14.7* 30.1* 30.9*  MCV 97.1 94.8 89.1 88.5  PLT 73* 65* 78* 85*    Cardiac Enzymes: No results for input(s): CKTOTAL, CKMB, CKMBINDEX, TROPONINI in the last 168 hours.  BNP: Invalid input(s): POCBNP  CBG: Recent Labs  Lab 03/17/21 1150 03/17/21 1652 03/17/21 2130 03/18/21 0648 03/18/21 1300  GLUCAP 107* 120* 121* 165* 109*    Microbiology: Results for orders placed or performed during the hospital encounter of 03/16/21  Resp Panel by RT-PCR (Flu A&B, Covid) Nasopharyngeal Swab     Status: None   Collection Time: 03/16/21  9:38 PM   Specimen: Nasopharyngeal Swab; Nasopharyngeal(NP) swabs in vial transport medium  Result Value Ref Range Status   SARS Coronavirus 2 by RT PCR NEGATIVE NEGATIVE Final    Comment: (NOTE) SARS-CoV-2 target nucleic acids are NOT DETECTED.  The SARS-CoV-2 RNA is generally detectable in upper respiratory specimens during the acute phase of infection. The lowest concentration  of SARS-CoV-2 viral copies this assay can detect is 138 copies/mL. A negative result does not preclude SARS-Cov-2 infection and should not be used as the sole basis for treatment or other patient management decisions. A negative result may occur with  improper specimen collection/handling, submission of specimen other than nasopharyngeal swab, presence of viral mutation(s) within the areas targeted by this assay, and inadequate number of viral copies(<138 copies/mL). A negative result must be combined with clinical observations, patient history, and epidemiological information. The expected result is Negative.  Fact Sheet for Patients:  EntrepreneurPulse.com.au  Fact Sheet for Healthcare Providers:   IncredibleEmployment.be  This test is no t yet approved or cleared by the [place] FDA and  has been authorized for detection and/or diagnosis of SARS-CoV-2 by FDA under an Emergency Use Authorization (EUA). This EUA will remain  in effect (meaning this test can be used) for the duration of the COVID-19 declaration under Section 564(b)(1) of the Act, 21 U.S.C.section 360bbb-3(b)(1), unless the authorization is terminated  or revoked sooner.       Influenza A by PCR NEGATIVE NEGATIVE Final   Influenza B by PCR NEGATIVE NEGATIVE Final    Comment: (NOTE) The Xpert Xpress SARS-CoV-2/FLU/RSV plus assay is intended as an aid in the diagnosis of influenza from Nasopharyngeal swab specimens and should not be used as a sole basis for treatment. Nasal washings and aspirates are unacceptable for Xpert Xpress SARS-CoV-2/FLU/RSV testing.  Fact Sheet for Patients: EntrepreneurPulse.com.au  Fact Sheet for Healthcare Providers: IncredibleEmployment.be  This test is not yet approved or cleared by the [place] FDA and has been authorized for detection and/or diagnosis of SARS-CoV-2 by FDA under an Emergency Use Authorization (EUA). This EUA will remain in effect (meaning this test can be used) for the duration of the COVID-19 declaration under Section 564(b)(1) of the Act, 21 U.S.C. section 360bbb-3(b)(1), unless the authorization is terminated or revoked.  Performed at [facility], [place] [address], [place], [place] [address]     Coagulation Studies: Recent Labs    03/17/21 0438  LABPROT 14.4  INR 1.1    Urinalysis: Recent Labs    03/18/21 1255  COLORURINE YELLOW  LABSPEC 1.020  PHURINE 7.5  GLUCOSEU NEGATIVE  HGBUR TRACE*  BILIRUBINUR NEGATIVE  KETONESUR NEGATIVE  PROTEINUR >300*  NITRITE NEGATIVE  LEUKOCYTESUR NEGATIVE      Imaging: No results found.   Medications:     sodium  chloride   Intravenous Once   amLODipine  10 mg Oral Daily   carbamide peroxide  5 drop Both EARS BID   darbepoetin (ARANESP) injection - NON-DIALYSIS  60 mcg Subcutaneous Q Sat-1800   famotidine  10 mg Oral Daily   hydrALAZINE  25 mg Oral Q8H   insulin aspart  0-6 Units Subcutaneous TID WC   lidocaine  1 patch Transdermal Q24H   sodium bicarbonate  1,300 mg Oral BID   acetaminophen **OR** acetaminophen, albuterol, ondansetron **OR** ondansetron (ZOFRAN) IV, polyethylene glycol  Assessment/ Plan:  CKD 5 progressive renal insufficiency creatinine 6.28 in 2022.  Palliative care suggested by nephrology 03/18/2021.  Dialogue regarding need for renal replacement therapy no urgent needs at this time. Metabolic acidosis started on sodium bicarbonate therapy Anemia status post transfusion Aranesp 60 mcg weekly Hypothyroidism on supplementation Diabetes mellitus as per primary service   LOS: 3 Sherril Croon '@TODAY''@7'$ :18 AM

## 2021-03-19 NOTE — Progress Notes (Signed)
DISCHARGE NOTE HOME Kayla [name] to be discharged Home per MD order. Discussed prescriptions and follow up appointments with the patient. Prescriptions given to patient; medication list explained in detail. Patient verbalized understanding.  Skin clean, dry and intact without evidence of skin break down, no evidence of skin tears noted. IV catheter discontinued intact. Site without signs and symptoms of complications. Dressing and pressure applied. Pt denies pain at the site currently. No complaints noted.  Patient free of lines, drains, and wounds.   An After Visit Summary (AVS) was printed and given to the patient. Patient escorted via wheelchair, and discharged home via private auto.  Berneta Levins, RN

## 2021-03-19 NOTE — Discharge Instructions (Addendum)
Kayla [name],  Thank you for allowing [place] to care for you during your hospitalization.  You were diagnosed with symptomatic anemia, this is when your hemoglobin levels drop and we believe that can cause some of the symptoms you are having such as tremors and difficulty concentrating.  Believe that this is secondary to your worsening kidney function.  Your evaluated by the nephrologist during admission and started on a medication, Aranesp, to help you produce more red blood cells. This will be given by the nephrologist, please follow up with them.   For your renal disease, please follow-up with the kidney doctors.  They will work to schedule you an appointment to follow-up with them.  Is recommended that you go to a skilled nursing facility, however this was declined by you as well as her daughter.  He was advised for home health rehabilitation however your daughter declined this and stated that she was able to take care of you.  We will have you follow-up in our clinic later this week.  Please be aware of a phone call coming from them to schedule this.  For your fibromyalgia and chronic pain, we will be restarting your venlafaxine and topiramate at a lower dose and giving you a week's dose of Dilaudid.  We are unable to start Cymbalta due to your kidney function worsening.  We have discontinuing your gabapentin, please do not take this.  Your kidney function has worsened to the point where it is unsafe for you to take this medication.  Please be careful when trying over-the-counter or-year-old pain medications as with your new kidney function most of these are not recommended.  Family questions about what you can and cannot take for pain please discuss this at your follow-up appointment with our clinic.  Please continue with your current regimen for your diabetes.  If you find yourself having low sugars, please call our clinic and stop taking the insulin.  Thank you for allowing me to assist in her care Dr.  Johnney Ou

## 2021-03-19 NOTE — Progress Notes (Cosign Needed)
    Durable Medical Equipment  (From admission, onward)           Start     Ordered   03/19/21 1308  For home use only DME lightweight manual wheelchair with seat cushion  Once       Comments: Patient suffers from unstable gait which impairs their ability to perform daily activities like bathing, toileting in the home.  A walker will not resolve  issue with performing activities of daily living. A wheelchair will allow patient to safely perform daily activities. Patient is not able to propel themselves in the home using a standard weight wheelchair due to general weakness. Patient can self propel in the lightweight wheelchair. Length of need 6 months . Accessories: elevating leg rests (ELRs), wheel locks, extensions and anti-tippers.   03/19/21 1310   03/19/21 1237  DME 3-in-1  Once        03/19/21 1243

## 2021-03-19 NOTE — TOC Transition Note (Signed)
Transition of Care [facility]) - CM/SW Discharge Note   Patient Details  Name: Kayla [name] MRN: [id number] Date of Birth: 18-Jul-1961  Transition of [facility]) CM/SW Contact:  Bartholomew Crews, RN Phone Number: [phone] 03/19/2021, 1:40 PM   Clinical Narrative:     Notified by MD of potential DME needs for patient without insurance. Referral to [employer] to assist with charity needs. [employer] follow up with daughter found that patient does have access to 2 wheelchairs and a bedside commode. DC medications sent to [facility] outpatient pharmacy through IM pharmacy program. Patient to follow up with IM for hospital follow up and primary care.   Final next level of care: Home/Self Care Barriers to Discharge: No Barriers Identified   Patient Goals and CMS Choice        Discharge Placement                       Discharge Plan and Services                                     Social Determinants of Health (SDOH) Interventions     Readmission Risk Interventions No flowsheet data found.

## 2021-03-19 NOTE — Discharge Summary (Addendum)
Name: Kayla [name] MRN: [id number] DOB: 09/01/1961 60 y.o. PCP: Asencion Noble, MD  Date of Admission: 03/16/2021  5:53 PM Date of Discharge: 03/19/21 Attending Physician: Dr. Evette Doffing  Discharge Diagnosis: Symptomatic anemia CKD stage V Fibromyalgia Hypertension Type 2 diabetes Systolic murmur Frequent falls  Discharge Medications: Allergies as of 03/19/2021       Reactions   Ace Inhibitors    Dizziness, nausea   Percocet [oxycodone-acetaminophen] Itching   Statins    Latex Rash   Sulfamethoxazole Rash   Sulfites Rash        Medication List     STOP taking these medications    Contour Next Test test strip Generic drug: glucose blood   ferrous gluconate 324 MG tablet Commonly known as: FERGON   furosemide 20 MG tablet Commonly known as: LASIX   gabapentin 600 MG tablet Commonly known as: NEURONTIN   lansoprazole 30 MG capsule Commonly known as: Prevacid   levothyroxine 200 MCG tablet Commonly known as: SYNTHROID   topiramate 25 MG capsule Commonly known as: TOPAMAX Replaced by: topiramate 25 MG tablet   Vitamin D (Ergocalciferol) 1.25 MG (50000 UNIT) Caps capsule Commonly known as: DRISDOL       TAKE these medications    Accu-Chek Aviva Plus w/Device Kit 1 kit by Does not apply route as directed.   accu-chek softclix lancets Use as instructed   acetaminophen 325 MG tablet Commonly known as: TYLENOL Take 2 tablets (650 mg total) by mouth every 6 (six) hours as needed for mild pain (or Fever >/= 101).   amLODipine 10 MG tablet Commonly known as: NORVASC Take 1 tablet (10 mg total) by mouth daily. Start taking on: March 20, 2021 What changed:  medication strength how much to take   Basaglar KwikPen 100 UNIT/ML Inject 0.2 mLs (20 Units total) into the skin daily.   Bayer Microlet Lancets lancets Check blood a sugar up to 3 times daily   Darbepoetin Alfa 60 MCG/0.3ML Sosy injection Commonly known as: ARANESP Inject 0.3 mLs (60  mcg total) into the skin every Saturday at 6 PM. Start taking on: March 24, 2021   hydrALAZINE 25 MG tablet Commonly known as: APRESOLINE Take 1 tablet (25 mg total) by mouth every 8 (eight) hours.   HYDROmorphone 2 MG tablet Commonly known as: Dilaudid Take 1 tablet (2 mg total) by mouth every 8 (eight) hours as needed for up to 5 days for severe pain.   sodium bicarbonate 650 MG tablet Take 2 tablets (1,300 mg total) by mouth 2 (two) times daily. What changed:  medication strength how much to take when to take this   topiramate 25 MG tablet Commonly known as: Topamax Take 1 tablet (25 mg total) by mouth at bedtime. Replaces: topiramate 25 MG capsule   venlafaxine XR 37.5 MG 24 hr capsule Commonly known as: Effexor XR Take 1 capsule (37.5 mg total) by mouth daily.               Durable Medical Equipment  (From admission, onward)           Start     Ordered   03/19/21 1308  For home use only DME lightweight manual wheelchair with seat cushion  Once       Comments: Patient suffers from unstable gait which impairs their ability to perform daily activities like bathing, toileting in the home.  A walker will not resolve  issue with performing activities of daily living. A wheelchair will allow patient  to safely perform daily activities. Patient is not able to propel themselves in the home using a standard weight wheelchair due to general weakness. Patient can self propel in the lightweight wheelchair. Length of need 6 months . Accessories: elevating leg rests (ELRs), wheel locks, extensions and anti-tippers.   03/19/21 1310   03/19/21 1237  DME 3-in-1  Once        03/19/21 1243            Disposition and follow-up:   Kayla [name] was discharged from [facility] in Stable condition.  At the hospital follow up visit please address:  1.  Follow-up:  a. 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 CKD 5 - Nephro  follow up. Avoiding nephrotoxic meds  c. Hypothyroidism - Did not tolerate levothyroxine, f/u endocrinology.  Patient will need to meet with financial assistance from [facility] clinic and applying for orange card.  If unable to, consider [place]  d. Fibromyalgia - Please assess pain and adherence to dilaudid, topiramate, venlafaxine  e.  Frequent falls-follow-up with how patient is managing at home with wheelchair  f.  Systolic murmur-LV and RV hypertrophy, will need cardiac MRI or PYP scan  g. Hypertension-follow-up repeat BP and med adherence  2.  Labs / imaging needed at time of follow-up: CBC, BMP, Hgb A1c  3.  Pending labs/ test needing follow-up: None  4.  Medication Changes  Started: Aranesp, NaBicarb, Dilaudid, hydralazine  Stopped: Levothyroxine  Changed: Amlodipine 5 to 10 mg, topiramate 25 mg daily  Abx - None End Date: N/A  Follow-up Appointments:  Follow-up Information     Asencion Noble, MD Follow up.   Specialty: Internal Medicine Why: I will reach out to the clinic to schedule you a follow up appointment, after 2pm per your daughters request Contact information: [address] [address] [phone]         [place], [place]. Call.   Why: Please call and confirm that an appointment was scheduled for you for follow up Contact information: [place] [address] [phone]                 Hospital Course by problem list:  Symptomatic anemia Patient initially admitted with nonspecific symptoms of tremor, poor concentration, diffuse pain and recent fall with questionable syncope.  She is found to have acute on chronic anemia with hemoglobin of 5.6.  During this time she was also found to have progression of her CKD stage V.  She was given 2 units of PRBC's and her posttransfusion hemoglobin remained above 7.  Did not appear to have an acute bleed and was thought to be secondary to her CKD.  Colonoscopy is due on 01/08/2023.  She  was started on Aranesp by nephrology.  She will follow-up in our clinic for repeat CBC and continue to follow with nephrology for continuation of Aranesp treatment.  It was recommended that patient go to SNF per physical therapy and Occupational Therapy however patient's daughter as well as the patient both declined this.  They also declined home health services as patient's daughter states that she will be able to take care of the patient full-time and would rather do it herself rather than having other caretakers assist her mother.  Patient already has wheelchair and 3 and 1 come out of the house, both of which were recommended by physical therapy.  CKD stage V Patient with progression of her  CKD to stage V.  She was evaluated by nephrology during her admission.  There is no emergent indication for dialysis her acidosis was corrected with bicarbonate tabs, these were continued at discharge.  The patient and her daughter had multiple conversations with [facility] as well as nephrology concerning moving forward with dialysis.  Frequently declines wanting to pursue dialysis, however patient's daughter would like her mother to discuss these matters further in coming months.  Nephrology will be setting up patient with a follow-up appointment to be seen in the clinic.  Patient will work with [employer] financial services to obtain orange card.  Has not been seen in our clinic for what appears to be 1 to 2 years.  Scheduled for follow-up an appointment later this week.  Fibromyalgia Patient with history of chronic pain thought to be secondary to fibromyalgia.  Patient's daughter notes that this has not been well controlled recently.  Does not appear as though patient has been continued on her home medications.  Patient daughter states that she believes the patient has been on gabapentin as well as oxycodone.  Not see oxycodone in the patient's medical chart nor in the PDMP.  We will also have to discontinue gabapentin as  patient's GFR is below 10.  Discussed with the patient patient's daughter.  We will restart venlafaxine and start her on 2 mg Dilaudid every 8 hours and also restart her topiramate at a renally modified dose.  Hypertension During patient's admission her blood pressure was above goal, 151/73.  Her amlodipine was increased to 10 mg daily.  She was also started on hydralazine 25 mg 3 times daily has had improvement of her blood pressure.  Will need follow-up on outpatient basis and titration of these medications.  Type 2 diabetes Patient with past medical history of type 2 diabetes A1c under 6.  Appears as though her medical medication was long-acting insulin with patient preferred.  Also difficulty with starting other medications in setting of her CKD.  Most recent clinic note from 1 to 2 years ago noted patient was self titrating insulin as needed.  Would recommend continuing this following up in outpatient basis with A1c as well as titrating insulin as needed.  Systolic murmur Patient with history of systolic murmur since childhood per patient has never had an echocardiogram done.  2 out of 6 systolic murmur.  Echocardiogram was ordered while patient was at [place] [facility] and performed day of discharge.  Patient with hyperdynamic EF and with biventricular hypertrophy.  She will need PYP or cardiac MRI to further evaluate for possible cardiac amyloid.  Frequent falls Patient noted to have frequent falls prior to her hospital mission.  Patient work with physical therapy as well as Occupational Therapy recommended SNF as well as wheelchair and 3 in 1 commode.  Patient and her daughter refused SNF facility as well as home health.  Patient's daughter believes that she will be able to take care of patient full-time.  Discharge Subjective:  Patient resting comfortably in bed with daughter is her.  Patient and patient's daughter both refused SNF placement states that they would prefer going home.  Patient has  had difficulty ambulating since breaking her ankle.  Patient daughter states that she has a LPN and is able to take care of the patient at home.  She would not like home health to work with her mother until she is able to be there full-time.  Patient's daughter also has concerns about patient's chronic pain.  Discussed that  we will have to discontinue some of her usual home medication such as gabapentin because of her worsening renal function.  Patient daughter knowledges understanding but would like patient's pain under control prior to leaving.  Discussed with her that we will to put a plan in place but she will need to follow-up closely with her outpatient clinic to try and control her pain.  Patient daughter in agreement with this.  I plan to meet with the patient and her daughter prior to discharge to review the medication changes.  We also discussed that patient will need close follow-up with nephrology and working with them to discuss possibility of dialysis in the coming months.  No other questions or concerns at this time.  Discharge Exam:   BP (!) 144/69   Pulse 69   Temp 98.5 F (36.9 C)   Resp 18   Wt 61.8 kg   LMP 01/12/2014   SpO2 98%   BMI 24.92 kg/m  Constitutional: Resting in bed, acute distressed when lower extremities palpated HENT: normocephalic atraumatic Eyes: conjunctiva non-erythematous Neck: supple Cardiovascular: regular rate and rhythm, 2/6 systolic murmur best heard over upper sternal border. Pulmonary/Chest: normal work of breathing on room air Abdominal: soft, non-tender, non-distended MSK: normal bulk and tone Neurological: alert & oriented x 3 Skin: warm and dry Psych: Normal mood and thought process  Pertinent Labs, Studies, and Procedures:  CBC Latest Ref Rng & Units 03/19/2021 03/18/2021 03/17/2021  WBC 4.0 - 10.5 K/uL 8.5 8.4 8.7  Hemoglobin 12.0 - 15.0 g/dL 9.2(L) 10.5(L) 10.5(L)  Hematocrit 36.0 - 46.0 % 27.1(L) 30.9(L) 30.1(L)  Platelets 150 - 400  K/uL 86(L) 85(L) 78(L)    CMP Latest Ref Rng & Units 03/19/2021 03/18/2021 03/17/2021  Glucose 70 - 99 mg/dL 128(H) 133(H) -  BUN 6 - 20 mg/dL 59(H) 60(H) -  Creatinine 0.44 - 1.00 mg/dL 6.95(H) 6.84(H) -  Sodium 135 - 145 mmol/L 136 139 -  Potassium 3.5 - 5.1 mmol/L 4.1 4.4 -  Chloride 98 - 111 mmol/L 111 110 -  CO2 22 - 32 mmol/L 16(L) 15(L) -  Calcium 8.9 - 10.3 mg/dL 9.2 9.5 -  Total Protein 6.5 - 8.1 g/dL - - -  Total Bilirubin 0.3 - 1.2 mg/dL - - 2.4(H)  Alkaline Phos 38 - 126 U/L - - -  AST 15 - 41 U/L - - -  ALT 0 - 44 U/L - - -    CT Abdomen Pelvis Wo Contrast  Result Date: 03/16/2021 CLINICAL DATA:  Flank pain for 2 weeks following fall, initial encounter EXAM: CT ABDOMEN AND PELVIS WITHOUT CONTRAST TECHNIQUE: Multidetector CT imaging of the abdomen and pelvis was performed following the standard protocol without IV contrast. COMPARISON:  04/13/2020 FINDINGS: Lower chest: Scarring is noted in the lung bases bilaterally stable in appearance from the prior exam. Hepatobiliary: No focal liver abnormality is seen. Gallbladder is well distended. Single dependent gallstone is noted. Pancreas: Unremarkable. No pancreatic ductal dilatation or surrounding inflammatory changes. Spleen: Normal in size without focal abnormality. Adrenals/Urinary Tract: Adrenal glands are well visualized and show stable thickening without focal mass. Punctate bilateral calculi are identified without obstructive change. Mild perinephric stranding is again seen and stable. The ureters are within normal limits bilaterally. The bladder is partially distended. Stomach/Bowel: Colon is well visualized and within normal limits. The appendix is not seen consistent with prior appendectomy. No small bowel or gastric abnormality is seen. Vascular/Lymphatic: Aortic atherosclerosis. No enlarged abdominal or pelvic lymph nodes. Reproductive: Diffuse  calcifications throughout the uterus are again identified. No adnexal mass is seen.  Other: No abdominal wall hernia or abnormality. No abdominopelvic ascites. Musculoskeletal: No acute or significant osseous findings. IMPRESSION: Cholelithiasis without complicating factors. Bibasilar scarring. Punctate bilateral renal calculi are noted. Electronically Signed   By: Inez Catalina M.D.   On: 03/16/2021 22:32   DG Chest 2 View  Result Date: 03/16/2021 CLINICAL DATA:  Headache, dizziness, right flank pain EXAM: CHEST - 2 VIEW COMPARISON:  05/13/2019 FINDINGS: Frontal and lateral views of the chest demonstrate an unremarkable cardiac silhouette. Linear opacities at the lung bases consistent with subsegmental atelectasis or scarring. No acute airspace disease, effusion, or pneumothorax. No acute displaced fractures. IMPRESSION: 1. Bibasilar scarring or atelectasis.  No acute airspace disease. Electronically Signed   By: Randa Ngo M.D.   On: 03/16/2021 22:22   US RENAL  Result Date: 03/17/2021 CLINICAL DATA:  Chronic renal insufficiency EXAM: RENAL / URINARY TRACT ULTRASOUND COMPLETE COMPARISON:  03/16/2021 FINDINGS: Right Kidney: Renal measurements: 7.9 x 5.2 x 3.7 cm = volume: 80.3 mL. Increased renal cortical echotexture. No hydronephrosis or renal mass. Left Kidney: Renal measurements: 9.1 x 4.9 x 3.5 cm = volume: 80.3 mL. Increased renal cortical echotexture. No hydronephrosis or renal mass. Nonobstructing 6 mm lower pole calculus. Bladder: Appears normal for degree of bladder distention. Other: None. IMPRESSION: 1. Increased renal cortical echotexture consistent with medical renal disease. 2. Nonobstructing 6 mm left renal calculus. Electronically Signed   By: Randa Ngo M.D.   On: 03/17/2021 01:43   CT L-SPINE NO CHARGE  Result Date: 03/16/2021 CLINICAL DATA:  Lumbar back pain. Fall 2 weeks ago. History of fibromyalgia. EXAM: CT LUMBAR SPINE WITHOUT CONTRAST TECHNIQUE: Multidetector CT imaging of the lumbar spine was performed without intravenous contrast administration.  Multiplanar CT image reconstructions were also generated. COMPARISON:  None. FINDINGS: Segmentation: 5 lumbar type vertebrae. Alignment: Normal. Vertebrae: No acute fracture or focal pathologic process. 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 Paraspinal and other soft tissues: Assessed on concurrent abdominopelvic CT, reported separately. Unremarkable noncontrast appearance of the paraspinal musculature. Disc levels: Mild broad-based disc bulge at L4-L5 and ligamentum flavum hypertrophy. This causes mild bilateral neural foraminal stenosis. Remaining disc spaces are preserved. IMPRESSION: 1. No acute fracture or subluxation of the lumbar spine. 2. Mild broad-based disc bulge at L4-L5 and ligamentum flavum hypertrophy causing mild bilateral neural foraminal stenosis. 3. 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 Electronically Signed   By: Keith Rake M.D.   On: 03/16/2021 22:37     Discharge Instructions: Discharge Instructions     Call MD for:  difficulty breathing, headache or visual disturbances   Complete by: As directed    Call MD for:  extreme fatigue   Complete by: As directed    Call MD for:  hives   Complete by: As directed    Call MD for:  persistant dizziness or light-headedness   Complete by: As directed    Call MD for:  persistant nausea and vomiting   Complete by: As directed    Call MD for:  redness, tenderness, or signs of infection (pain, swelling, redness, odor or green/yellow discharge around incision site)   Complete by: As directed    Call MD for:  severe uncontrolled pain   Complete by: As directed    Call MD for:  temperature >100.4   Complete by: As directed    Diet -  low sodium heart healthy   Complete by: As directed    Increase activity slowly   Complete by: As directed        Signed: Riesa Pope, MD 03/19/2021, 3:30 PM    Pager: [phone]

## 2021-03-19 NOTE — Progress Notes (Signed)
  Echocardiogram 2D Echocardiogram has been performed.  Kayla [name] 03/19/2021, 2:28 PM

## 2021-03-20 LAB — BPAM RBC
Blood Product Expiration Date: 202207192359
Blood Product Expiration Date: 202207192359
Unit Type and Rh: 5100
Unit Type and Rh: 5100

## 2021-03-20 LAB — TYPE AND SCREEN
ABO/RH(D): O POS
Antibody Screen: NEGATIVE
Unit division: 0
Unit division: 0

## 2021-03-20 LAB — GLUCOSE, CAPILLARY
Glucose-Capillary: 147 mg/dL — ABNORMAL HIGH (ref 70–99)
Glucose-Capillary: 168 mg/dL — ABNORMAL HIGH (ref 70–99)
Glucose-Capillary: 127 mg/dL — ABNORMAL HIGH (ref 70–99)
Glucose-Capillary: 143 mg/dL — ABNORMAL HIGH (ref 70–99)

## 2021-03-20 LAB — PARATHYROID HORMONE, INTACT (NO CA): PTH: 720 pg/mL — ABNORMAL HIGH (ref 15–65)

## 2021-03-21 ENCOUNTER — Telehealth: Payer: Self-pay | Admitting: Internal Medicine

## 2021-03-21 NOTE — Telephone Encounter (Signed)
TOC HFU No appointment scheduled.  Spoke with patient's daughter, Alvina Chou, states once she gets her mom settled she will call back to schedule an appointment.

## 2021-03-21 NOTE — Telephone Encounter (Signed)
-----   Message from Riesa Pope, MD sent at 03/19/2021 12:46 PM EDT ----- Regarding: Hospital Follow Up Good Morning,   Can we please schedule Kayla [name] for a post hospital follow up for Thursday after 2pm? There are many things that need to be reviewed so an hour appointment may be needed.Thank you!   Dr. Raliegh Ip.

## 2021-04-05 ENCOUNTER — Encounter: Payer: Self-pay | Admitting: Student

## 2021-04-05 ENCOUNTER — Ambulatory Visit (INDEPENDENT_AMBULATORY_CARE_PROVIDER_SITE_OTHER): Payer: Self-pay | Admitting: Student

## 2021-04-05 ENCOUNTER — Other Ambulatory Visit (HOSPITAL_COMMUNITY): Payer: Self-pay

## 2021-04-05 ENCOUNTER — Other Ambulatory Visit (HOSPITAL_COMMUNITY)
Admission: RE | Admit: 2021-04-05 | Discharge: 2021-04-05 | Disposition: A | Discharge status: Home / Self Care | Payer: Self-pay | Source: Ambulatory Visit | Attending: Internal Medicine | Admitting: Internal Medicine

## 2021-04-05 VITALS — BP 164/81 | HR 71 | Temp 98.8°F | Wt 117.4 lb

## 2021-04-05 DIAGNOSIS — D649 Anemia, unspecified: Secondary | ICD-10-CM

## 2021-04-05 DIAGNOSIS — Z0000 Encounter for general adult medical examination without abnormal findings: Secondary | ICD-10-CM

## 2021-04-05 DIAGNOSIS — E1165 Type 2 diabetes mellitus with hyperglycemia: Secondary | ICD-10-CM

## 2021-04-05 DIAGNOSIS — I10 Essential (primary) hypertension: Secondary | ICD-10-CM

## 2021-04-05 DIAGNOSIS — N898 Other specified noninflammatory disorders of vagina: Secondary | ICD-10-CM

## 2021-04-05 DIAGNOSIS — IMO0002 Reserved for concepts with insufficient information to code with codable children: Secondary | ICD-10-CM

## 2021-04-05 DIAGNOSIS — N760 Acute vaginitis: Secondary | ICD-10-CM

## 2021-04-05 DIAGNOSIS — M797 Fibromyalgia: Secondary | ICD-10-CM

## 2021-04-05 DIAGNOSIS — E890 Postprocedural hypothyroidism: Secondary | ICD-10-CM

## 2021-04-05 DIAGNOSIS — N185 Chronic kidney disease, stage 5: Secondary | ICD-10-CM

## 2021-04-05 DIAGNOSIS — E1140 Type 2 diabetes mellitus with diabetic neuropathy, unspecified: Secondary | ICD-10-CM

## 2021-04-05 DIAGNOSIS — Z7189 Other specified counseling: Secondary | ICD-10-CM

## 2021-04-05 LAB — POCT GLYCOSYLATED HEMOGLOBIN (HGB A1C): Hemoglobin A1C: 5.4 % (ref 4.0–5.6)

## 2021-04-05 LAB — GLUCOSE, CAPILLARY: Glucose-Capillary: 217 mg/dL — ABNORMAL HIGH (ref 70–99)

## 2021-04-05 MED ORDER — HYDROMORPHONE HCL 2 MG PO TABS
2.0000 mg | ORAL_TABLET | Freq: Every day | ORAL | 0 refills | Status: AC | PRN
  Filled 2021-04-05: qty 30, 30d supply, fill #0

## 2021-04-05 MED ORDER — CETAPHIL MOISTURIZING EX LOTN
1.0000 "application " | TOPICAL_LOTION | CUTANEOUS | 0 refills | Status: AC | PRN
  Filled 2021-04-05: qty 236, fill #0

## 2021-04-05 NOTE — Patient Instructions (Addendum)
Kayla [name], it was a pleasure seeing you today!  Today we discussed: - I will call you tomorrow with the results of the lab work.  - I have prescribed dilaudid for pain. Please avoid Aleve, Advil, and Goody Powder.  - For your skin, this is likely due to your low thyroid. I have prescribed some lotion.  - I have also referred you to our pharmacist and community care personnel to help assist with financial aid. It will be important so that you can go to the kidney doctors sooner rather than later.  - I believe the shaking is partially due to pain and partially due to low blood counts. We will obtain labs today.  - Use warm compresses on your eyes. If this continues to persist, please come back to the clinic.  I have ordered the following labs today:   Lab Orders  Glucose, capillary  POC Hbg A1C     I have ordered the following medication/changed the following medications:    Start the following medications: Meds ordered this encounter  Medications   cetaphil (CETAPHIL) lotion    Sig: Apply 1 application topically as needed for dry skin.    Dispense:  236 mL    Refill:  0    IM Program   HYDROmorphone (DILAUDID) 2 MG tablet    Sig: Take 1 tablet (2 mg total) by mouth daily as needed for severe pain.    Dispense:  30 tablet    Refill:  0    IM program     Follow-up: 2 months   Please make sure to arrive 15 minutes prior to your next appointment. If you arrive late, you may be asked to reschedule.   We look forward to seeing you next time. Please call our clinic at [phone] if you have any questions or concerns. The best time to call is Monday-Friday from 9am-4pm, but there is someone available 24/7. If after hours or the weekend, call the main hospital number and ask for the Internal Medicine Resident On-Call. If you need medication refills, please notify your pharmacy one week in advance and they will send [place] a request.  Thank you for letting us take part in your  care. Wishing you the best!  Thank you, Sanjuan Dame, MD

## 2021-04-06 ENCOUNTER — Other Ambulatory Visit: Payer: Self-pay | Admitting: Student

## 2021-04-06 ENCOUNTER — Other Ambulatory Visit (HOSPITAL_COMMUNITY): Payer: Self-pay

## 2021-04-06 DIAGNOSIS — Z7189 Other specified counseling: Secondary | ICD-10-CM | POA: Insufficient documentation

## 2021-04-06 DIAGNOSIS — N760 Acute vaginitis: Secondary | ICD-10-CM

## 2021-04-06 LAB — CERVICOVAGINAL ANCILLARY ONLY
Bacterial Vaginitis (gardnerella): POSITIVE — AB
Candida Glabrata: NEGATIVE
Candida Vaginitis: NEGATIVE
Comment: NEGATIVE
Comment: NEGATIVE
Comment: NEGATIVE
Comment: NEGATIVE
Trichomonas: NEGATIVE

## 2021-04-06 LAB — CBC
Hematocrit: 27.9 % — ABNORMAL LOW (ref 34.0–46.6)
Hemoglobin: 9.3 g/dL — ABNORMAL LOW (ref 11.1–15.9)
MCH: 30.9 pg (ref 26.6–33.0)
MCHC: 33.3 g/dL (ref 31.5–35.7)
MCV: 93 fL (ref 79–97)
Platelets: 97 10*3/uL — CL (ref 150–450)
RBC: 3.01 x10E6/uL — ABNORMAL LOW (ref 3.77–5.28)
RDW: 16.3 % — ABNORMAL HIGH (ref 11.7–15.4)
WBC: 5.7 10*3/uL (ref 3.4–10.8)

## 2021-04-06 LAB — BMP8+ANION GAP
Anion Gap: 20 mmol/L — ABNORMAL HIGH (ref 10.0–18.0)
BUN/Creatinine Ratio: 9 (ref 9–23)
BUN: 49 mg/dL — ABNORMAL HIGH (ref 6–24)
CO2: 16 mmol/L — ABNORMAL LOW (ref 20–29)
Calcium: 9.2 mg/dL (ref 8.7–10.2)
Chloride: 102 mmol/L (ref 96–106)
Creatinine, Ser: 5.42 mg/dL — ABNORMAL HIGH (ref 0.57–1.00)
Glucose: 216 mg/dL — ABNORMAL HIGH (ref 65–99)
Potassium: 3.9 mmol/L (ref 3.5–5.2)
Sodium: 138 mmol/L (ref 134–144)
eGFR: 9 mL/min/{1.73_m2} — ABNORMAL LOW (ref >59)

## 2021-04-06 MED ORDER — METRONIDAZOLE 0.75 % VA GEL
1.0000 | Freq: Two times a day (BID) | VAGINAL | 0 refills | Status: DC
  Filled 2021-04-06: qty 100, 5d supply, fill #0

## 2021-04-06 MED ORDER — METRONIDAZOLE 500 MG PO TABS
500.0000 mg | ORAL_TABLET | Freq: Two times a day (BID) | ORAL | 0 refills | Status: AC
Start: 2021-04-06 — End: 2021-04-16
  Filled 2021-04-06: qty 14, 7d supply, fill #0

## 2021-04-06 NOTE — Addendum Note (Signed)
Addended bySanjuan Dame on: 04/06/2021 04:05 PM   Modules accepted: Orders

## 2021-04-06 NOTE — Progress Notes (Signed)
   CC: hospitalization follow-up  HPI:  [name] is a 60 y.o. with medical history as below presenting to [facility] for hospitalization follow-up  Please see problem-based list for further details, assessments, and plans.  Past Medical History:  Diagnosis Date   ANA POSITIVE 02/09/2010   Anal warts 11/09/2012   Anxiety    Arthritis    "my bones ache all the time" (06/27/2014)   Cellulitis 06/27/2014   medial proximal left thigh extending to the perineium   Chronic bronchitis ([place])    "get it changing of the seasons; sometimes q yr; sometimes not"    Complication of anesthesia    sats drop post op   COPD (chronic obstructive pulmonary disease) ([place])    Deafness in 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"   Diabetic peripheral neuropathy ([place]) since <2005   "hands and feet"   Dizziness    Multiple falls at home   Fibromyalgia    Graves' disease    "they nuked it twice"   Headache(784.0)    "monthly" (06/27/2014)   Heart murmur    as a child-never had echo to evaluate   Hyperlipidemia    Hypertension    Hypothyroidism    Sickle cell trait ([place])    Trimalleolar fracture of left ankle 11/21/2014   Type 2 diabetes mellitus ([place])    Wears glasses    Review of Systems:  As per HPI  Physical Exam:  Vitals:   04/05/21 1415  BP: (!) 164/81  Pulse: 71  Temp: 98.8 F (37.1 C)  TempSrc: Oral  SpO2: 100%  Weight: 117 lb 6.4 oz (53.3 kg)   General: Chronically ill-appearing. No acute distress Eyes: Exophthalmos present. Dry, crusted drainage around bilateral lower eyelids.  CV: Regular rate, rhythm. 2/6 systolic murmur present.  Pulm: Normal work of breathing on room air. Clear to auscultation bilaterally. GU: Vulva without abnormalities. Mild, white, malodorous discharge present in vaginal canal. Cervix without erythema or bleeding.  MSK: Decreased bulk and tone throughout. No pitting edema bilaterally. Skin: Dry, flaky skin throughout. No rashes or lesions. Neuro:  Awake, alert, answering questions appropriately. Cranial nerves in tact. Motor 5/5 in upper extremities. Bilateral upper extremity sensation in tact. Difficult to assess lower extremity motor and sensation 2/2 pain.   Psych: Normal mood, speech.  Assessment & Plan:   See Encounters Tab for problem based charting.  Patient discussed with Dr. Heber [name]

## 2021-04-06 NOTE — Assessment & Plan Note (Signed)
Kayla [name] is presenting to clinic today following recent hospitalization for symptomatic anemia. Upon arrival to clinic, both Kayla [name] and her daughter were present in the room. Her daughter, Dicie Pippert, listed multiple issues that Kayla [name] is having and demanded that we "do something for her pain." Afterward, Kayla [name] left the room for own appointment.  Kayla [name] subsequently discussed her current home life. She is mostly staying with her daughter, which she describes as "feeling like I'm in a facility." She is very frustrated with her daughter and feels like she is pushing her to do things she is not necessarily comfortable with. During this discussion, I asked Kayla [name] if she felt safe at home. Initially, she states that most of the time she does not feel safe. Further describes that she feels her daughter is mentally manipulating her and she cannot have an independent life, which she desires. She denies any history of physical abuse and mentions that she has had access to food and water. Mentions that she does not think her daughter would ever physically hurt her.  Kayla [name] and I further discussed her current medical condition. I explained that she will need dialysis in order to prolong her life. We discussed further goals, including pursuing aggressive treatment with dialysis vs palliative route. She states that she would like to go through with dialysis, although if this does not improve her symptoms she would opt to stop. I explained that often dialysis patients have chronic fatigue and that likely her generalized pain would not cease simply for dialysis. She verbalized understanding and further states that she would still like to pursue this route. Her main concern at this juncture is to still be independent and to not live in a facility.  I discussed with Kayla [name] that she will need financial assistance if we are going to pursue dialysis. She mentions that she is willing to  do this. I have referred her to our clinical pharmacist in addition to our social worker to further help this. Ultimately, she will need an appointment with nephrology. If she does not see them in the near future, I fear she will be hospitalized again for complications with chronic kidney disease and will be faced with possible urgent/emergent dialysis.   Of note, Kayla [name] is requesting that future appointments are only conducted with a physician and herself in the room.

## 2021-04-08 DIAGNOSIS — N898 Other specified noninflammatory disorders of vagina: Secondary | ICD-10-CM | POA: Insufficient documentation

## 2021-04-08 HISTORY — DX: Other specified noninflammatory disorders of vagina: N89.8

## 2021-04-08 NOTE — Assessment & Plan Note (Signed)
Kayla [name] reports that she has been experiencing vaginal odor recently. She has not noticed discharge or pain with urination. Denies any recent sexual activity. Per chart review, she has a history of yeast vaginitis. Will plan on wet mount today for further evaluation.  - Wet mount prep  ADDENDUM: Found to have bacterial vaginosis on lab work. Kayla [name]' daughter has requested vaginal cream in order to avoid abdominal irritation.   After ordering Metrogel, the pharmacy tech let me know that this is not covered under the IM program and that oral metronidazole would be much cheaper. I have therefore sent in 7 days of oral metronidazole. - Metronidazole '500mg'$  twice daily x7d

## 2021-04-08 NOTE — Assessment & Plan Note (Signed)
Ms. Ocasio presents today partially to discuss her generalized pain. Since she was recently hospitalized, she reports that she has been taking Aleve, Advil, and Goody's powder to help control her pain. Mentions that she does not like narcotics, which is why she has not taken her the dilaudid she was given at discharged. Also mentions that she smokes marijuana to help with her pain. In addition, she has recently experienced generalized body shakes when her pain is worse. She initially denies this is associated with pain, but during our >1 hour visit, she only experienced these when she was having pain on my physical exam. Otherwise she denies resting or intentional tremors or history of seizures.  I explained to Ms. Gallatin that she should immediately stop taking all NSAID's due to her kidney disease. Mentioned that continuing to take these will only progress the disease quicker, which likely would result in needing dialysis in the near future. I noted that dilaudid is safer on the kidneys and she should be using this for pain. At this juncture, I do not believe her shaking episodes are 2/2 underlying neurologic disorder, but rather a sequale of her chronic pain syndrome. We will need to continue monitoring this in the future.   - Dilaudid '2mg'$  daily as needed for pain - If she is able to obtain insurance, referral for pain clinic

## 2021-04-08 NOTE — Assessment & Plan Note (Signed)
Prior to recent hospitalization, Kayla [name] had not followed up with IMTS or nephrology in roughly 1-2 years. It was found during discharge that she has had progression of CKD and will likely need dialysis in the near future. She endorses frustration that her daughter is pushing for her to do dialysis so aggressively. I believe she would further benefit from continued conversations as well as palliative consult. Unfortunately her uninsured status is a barrier to this. We will provide resources for financial aid and encourage her to follow-up with nephrology.  - Follow-up with nephrology - Repeat BMP today

## 2021-04-08 NOTE — Assessment & Plan Note (Signed)
A1c today 5.4%. States that she has been compliant with her insulin as prescribed. Denies polyuria, polydipsia, hypoglycemic episodes.  - Continue glargine 20 units daily

## 2021-04-08 NOTE — Assessment & Plan Note (Signed)
Ms. Colantonio was most recently admitted for symptomatic anemia 2/2 progression of chronic kidney disease. Prior to hospitalization Ms. Kester had experienced multiple falls 2/2 dizziness, lightheadedness, and intermittent syncopal episodes. Since discharge she denies having these symptoms as well as melena, hematochezia, hematemesis, abdominal pain. During her hospitalization she was transfused multiple times and her H/H responded appropriately. She did receive Aranesp while in the hospital with plans for weekly transfusion, but she has not received this since discharge. Unfortunately she has not been able to return to nephrology due to financial strain. Moving forward we will need to make sure she has appropriate resources for her to follow-up with nephrology and ensure she can receive appropriate medications and monitoring. Today will repeat CBC, last Hgb 9.2 at discharge.  - Repeat CBC today - Follow-up with nephrology - Will need financial aid assistance with orange card, clinical pharmacist, social worker

## 2021-04-08 NOTE — Assessment & Plan Note (Signed)
Ms. Koscinski reports a significant history of hypothyroidism that she has been dealing with for years. Notes that she feels like this will never be fully fixed. She mentions chronic dry skin, thinning hair, cold intolerance, and poor appetite. Previously had been on Synthroid, but she was unable to tolerate this. Last TSH three weeks ago 160. Today she is particularly interested in discussing her dry skin. I discussed that dry skin is likely due to her hypothyroidism and the best treatment would be thyroid replacement therapy. However, given the time constraints of our appointment today, we were unable to fully discuss this. I have prescribed her moisturizing lotion for her to apply daily. In the future, can discuss thyroid extract medications to trial. Unfortunately our options are limited due to her uninsured status.   - Cetaphil lotion - Consider thyroid extract at her next appointment

## 2021-04-08 NOTE — Assessment & Plan Note (Signed)
BP Readings from Last 3 Encounters:  04/05/21 (!) 164/81  03/19/21 (!) 144/69  08/02/20 (!) 166/103   Kayla [name] reports that she is not taking any blood pressure medications at this time. States that she has been taking multiple NSAID's for pain relief. Denies chest pain, dyspnea, lower extremity edema. She does have some lightheadedness/dizziness most likely 2/2 symptomatic anemia. I encouraged [name] to continue her blood pressure medications to help reduce adverse events in the future.  - Amlodipine '10mg'$  daily - Hydralazine '25mg'$  three times daily

## 2021-04-08 NOTE — Assessment & Plan Note (Signed)
-   PAP smear completed today

## 2021-04-09 ENCOUNTER — Other Ambulatory Visit (HOSPITAL_COMMUNITY): Payer: Self-pay

## 2021-04-11 LAB — CYTOLOGY - PAP
Adequacy: ABSENT
Comment: NEGATIVE
Diagnosis: NEGATIVE
High risk HPV: NEGATIVE

## 2021-04-12 ENCOUNTER — Telehealth: Payer: Self-pay

## 2021-04-12 NOTE — Telephone Encounter (Signed)
   Telephone encounter was:  Unsuccessful.  04/12/2021 Name: Kayla [name] MRN: [id number] DOB: Oct 16, 1960  Unsuccessful outbound call made today to assist with:  Food Insecurity and financial.  Outreach Attempt:  1st Attempt  A HIPAA compliant voice message was left requesting a return call.  Instructed patient to call back at [phone].  Colletta Spillers, AAS Paralegal, [facility]  [address], [place] [address] ??[email or web address]  ?? [id number]   [email or web address]

## 2021-04-16 NOTE — Progress Notes (Signed)
Internal Medicine Clinic Attending ? ?Case discussed with Dr. Braswell  At the time of the visit.  We reviewed the resident?s history and exam and pertinent patient test results.  I agree with the assessment, diagnosis, and plan of care documented in the resident?s note.  ?

## 2021-04-18 ENCOUNTER — Telehealth: Payer: Self-pay

## 2021-04-18 NOTE — Telephone Encounter (Signed)
   Telephone encounter was:  Successful.  04/18/2021 Name: Kayla [name] MRN: [id number] DOB: 06-02-1961  Kayla [name] is a 60 y.o. year old female who is a primary care patient of Sherry Ruffing, Adela Lank, MD . The community resource team was consulted for assistance with  food and utility  Care guide performed the following interventions: Spoke with patient about food and utility resources . I told [name] that I could mail the letter and attachments to her but it would taka a few day.  She preferred to have resources emailed to her at [email or web address]. Also called daughter [name] unable to leave message voicemail full.   Follow Up Plan:  Care guide will follow up with patient by phone over the next 7 days  Kristyl Athens, AAS Paralegal, [facility]  [address],  [address] ??[email or web address]  ?? [phone]   [email or web address]

## 2021-04-20 ENCOUNTER — Telehealth: Payer: Self-pay

## 2021-04-20 NOTE — Telephone Encounter (Signed)
   Telephone encounter was:  Successful.  04/20/2021 Name: Kayla [name] MRN: [id number] DOB: November 28, 1960  Kayla [name] is a 60 y.o. year old female who is a primary care patient of Sherry Ruffing, Adela Lank, MD . The community resource team was consulted for assistance with Food Insecurity and utilities.  Care guide performed the following interventions: Spoke with patient's daughter she has received emailed resources for food and utility assistance.  Follow Up Plan:  No further follow up planned at this time. The patient has been provided with needed resources.  Lamark Schue, AAS Paralegal, [facility]  [address], [place] [address] ??[email or web address]  ?? [phone]   [email or web address]

## 2021-05-08 ENCOUNTER — Encounter: Payer: Self-pay | Admitting: Pharmacist

## 2021-05-15 ENCOUNTER — Encounter: Payer: Self-pay | Admitting: Pharmacist

## 2021-05-22 ENCOUNTER — Ambulatory Visit (INDEPENDENT_AMBULATORY_CARE_PROVIDER_SITE_OTHER): Payer: Self-pay | Admitting: Pharmacist

## 2021-05-22 DIAGNOSIS — N760 Acute vaginitis: Secondary | ICD-10-CM

## 2021-05-22 DIAGNOSIS — Z79899 Other long term (current) drug therapy: Secondary | ICD-10-CM

## 2021-05-22 NOTE — Patient Instructions (Addendum)
Kayla [name] it was a pleasure seeing you today.   Today we reviewed all of the medications you are currently taking. Included is an updated medication list. Please continue taking all medications as prescribed on this list.  If you ever need help accessing medications you can go to [email or web address]  If you have any questions please call the clinic and ask to speak with me.  Follow-up with provider as needed.

## 2021-05-22 NOTE — Progress Notes (Signed)
   Subjective:    Patient ID: Kayla [name], female    DOB: 05-27-1961, 60 y.o.   MRN: [id number]  HPI  Patient is a 60 y.o. female who presents for medication review and management. She is in good spirits and presents with assistance of wheelchair and daughter. Patient was referred and last seen by provider, Dr. Collene Gobble, on 04/05/21.  Medication Adherence Questionnaire (A score of 2 or more points indicates risk for nonadherence)  Do you know what each of your medicines is for? 0 (1 point if no)  Do you ever have trouble remembering to take your medicine? 0 (2 points if yes)  Do you ever not take a medicine because you feel you do not need it?  0 (1 point if yes)  Do you think that any of your medicines is not helping you? 1; pain medication (1 point if yes)  Do you have any physical problems such as vision loss that keep you from taking your medicines as prescribed?  2; cost (2 points if yes)  Do you think any of your medicine is causing a side effect?  0 (1 point if yes)  Do you know the names of ALL of your medicines? 0 (1 point if no)  Do you think that you need ALL of your medicines? 0 (1 point if no)  In the past 6 months, have you missed getting a refill or a new prescription filled on time? 1; cost (1 point if yes)  How often do you miss taking a dose of medicine?  0 Never (0 points), 1 or 2 times a month (1 points), 1 time a week (2 points), 2 or more times a week (3 points).   TOTAL SCORE 4/14    Objective:   Labs:   Physical Exam Neurological:     Mental Status: She is alert and oriented to person, place, and time.    Review of Systems  HENT:  Positive for hearing loss.        Patient has difficulty hearing   Assessment/Plan:   Understanding of regimen: good  Understanding of indications: good  Potential of compliance: good  Patient has known adherence challenges based on score of 4 for questionnaire. Biggest barrier for patient is cost. Patient unable to obtain  Medicaid insurance due to inheriting land. Moving forward patient has option to use [facility] or [facility]. Also discussed with daughter how to use goodrx if medication not able to be obtained at either of the other two pharmacies. [facility] may also be an option patient can consider pursuing as they have their own patient assistance program. Medication list reviewed and updated. The following issues were noted: patient unable to obtain metronidazole gel and did not tolerate metronidazole tablets, will route to PCP team as 0.75% gel is available at [facility]. Patient also not able to obtain Aranesp. Will try patient assistance but unsure if cost will be fully covered. May need to look into alternative options as patient reports continued pain from anemia. Patient was provided with a printed medication list.   Follow-up appointment with provided as needed. Written patient instructions provided.  This appointment required 45 minutes of direct patient care.  Thank you for involving pharmacy to assist in providing this patient's care.

## 2021-05-28 ENCOUNTER — Other Ambulatory Visit (HOSPITAL_COMMUNITY): Payer: Self-pay

## 2021-05-28 MED ORDER — METRONIDAZOLE 0.75 % EX GEL
1.0000 "application " | Freq: Two times a day (BID) | CUTANEOUS | 0 refills | Status: DC
  Filled 2021-05-28: qty 45, 23d supply, fill #0

## 2021-05-28 MED ORDER — METRONIDAZOLE 0.75 % EX GEL
1.0000 "application " | Freq: Two times a day (BID) | CUTANEOUS | 0 refills | Status: AC
  Filled 2021-05-28: qty 45, 23d supply, fill #0

## 2021-05-28 NOTE — Addendum Note (Signed)
Addended by: Edwyna Perfect on: 05/28/2021 09:12 AM   Modules accepted: Orders

## 2021-05-28 NOTE — Addendum Note (Signed)
Addended by: Edwyna Perfect on: 05/28/2021 08:09 AM   Modules accepted: Orders

## 2021-06-05 ENCOUNTER — Telehealth: Payer: Self-pay

## 2021-06-05 ENCOUNTER — Other Ambulatory Visit (HOSPITAL_COMMUNITY): Payer: Self-pay

## 2021-06-05 NOTE — Telephone Encounter (Signed)
Left vm with daughter regarding patient assistance application questions for her mothers medication.   Gave direct call back number [phone]

## 2021-07-13 ENCOUNTER — Other Ambulatory Visit (HOSPITAL_COMMUNITY): Payer: Self-pay

## 2021-07-13 ENCOUNTER — Encounter: Payer: Self-pay | Admitting: Student

## 2021-07-13 ENCOUNTER — Other Ambulatory Visit: Payer: Self-pay

## 2021-07-13 ENCOUNTER — Ambulatory Visit (INDEPENDENT_AMBULATORY_CARE_PROVIDER_SITE_OTHER): Payer: Self-pay | Admitting: Student

## 2021-07-13 DIAGNOSIS — M797 Fibromyalgia: Secondary | ICD-10-CM

## 2021-07-13 DIAGNOSIS — I10 Essential (primary) hypertension: Secondary | ICD-10-CM

## 2021-07-13 MED ORDER — HYDROMORPHONE HCL 2 MG PO TABS
2.0000 mg | ORAL_TABLET | Freq: Every day | ORAL | 0 refills | Status: AC | PRN
  Filled 2021-07-13: qty 7, 7d supply, fill #0

## 2021-07-13 MED ORDER — AMLODIPINE BESYLATE 10 MG PO TABS
10.0000 mg | ORAL_TABLET | Freq: Every day | ORAL | 3 refills | Status: AC
  Filled 2021-07-13: qty 30, 30d supply, fill #0

## 2021-07-13 MED ORDER — HYDRALAZINE HCL 25 MG PO TABS
25.0000 mg | ORAL_TABLET | Freq: Three times a day (TID) | ORAL | 3 refills | Status: AC
  Filled 2021-07-13: qty 90, 30d supply, fill #0

## 2021-07-13 MED ORDER — TOPIRAMATE 25 MG PO TABS
25.0000 mg | ORAL_TABLET | Freq: Every day | ORAL | 3 refills | Status: AC
  Filled 2021-07-13: qty 30, 30d supply, fill #0

## 2021-07-13 MED ORDER — VENLAFAXINE HCL ER 37.5 MG PO CP24
37.5000 mg | ORAL_CAPSULE | Freq: Every day | ORAL | 3 refills | Status: AC
  Filled 2021-07-13: qty 30, 30d supply, fill #0

## 2021-07-13 MED ORDER — SODIUM BICARBONATE 650 MG PO TABS
1300.0000 mg | ORAL_TABLET | Freq: Two times a day (BID) | ORAL | 3 refills | Status: AC
  Filled 2021-07-13: qty 120, 30d supply, fill #0

## 2021-07-13 NOTE — Progress Notes (Signed)
[facility] Internal Medicine Residency Telephone Encounter Continuity Care Appointment  HPI:  This telephone encounter was created for [name] on 07/13/2021 for the following purpose/cc: medication refill. Verified identity via DOB and home address. Spoke with daughter, Quinisha Cerminara, as patient is unable to clearly hear over the phone. [name] reports that patient has been having great difficulty at home over the past few months. Since her last in-person clinic visit on 04/08/21, her health has continued to deteriorate. She is no longer able to move her legs and thus is unable to move around (unclear as to why, this will require further workup). She does not have any of her medications, which have since expired. She has been unable to come to in-person office visits since then due to lack of transportation and inability to move patient into a vehicle without assistance. Daughter reports that she has been having trouble taking care of patient. States that she has been trying to get patient's medications, but has been unable to do so for the past month. Discussed that I will refill patient's chronic medications with extra refills so that she does not run out quickly. Also, will provide a short course of hydromorphone '2mg'$  daily for 1 week for pain control until she can be seen in person. Will message front desk to schedule patient for a 1-hour appointment on Wednesday or Thursday of this next week for an in-person visit to manage chronic medical issues and medication reconciliation. Of note, patient is currently uninsured so many treatment modalities may be unaffordable at this time.   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"   Diabetic peripheral neuropathy ([place]) since <2005   "hands and feet"   Dizziness    Multiple falls at home   Fibromyalgia    Graves' disease    "they nuked it twice"   Headache(784.0)    "monthly" (06/27/2014)   Heart murmur    as a child-never had echo to evaluate   Hyperlipidemia    Hypertension    Hypothyroidism    Sickle cell trait ([place])    Trimalleolar fracture of left ankle 11/21/2014   Type 2 diabetes mellitus ([place])    Wears glasses      ROS:  Negative aside from that listed in HPI   Assessment / Plan / Recommendations:  Please see A&P under problem oriented charting for assessment of the patient's acute and chronic medical conditions.  As always, pt is advised that if symptoms worsen or new symptoms arise, they should go to an urgent care facility or to to ER for further evaluation.   Consent and Medical Decision Making:  Patient discussed with Dr. Evette Doffing This is a telephone encounter between Kayla [name] and Virl Axe on 07/13/2021 for medication refill. The visit was conducted with the patient located at home and [facility] at [facility]. The patient's identity was confirmed using their DOB and current address. The  patient has consented to being evaluated through a telephone encounter and understands the associated risks (an examination cannot be done and the patient may need to come in for an appointment) / benefits (allows the patient to remain at home, decreasing exposure to coronavirus). I personally spent 22 minutes on medical discussion.

## 2021-07-13 NOTE — Assessment & Plan Note (Addendum)
Given 1 week course of '2mg'$  PO dilaudid for pain control until she can be seen in clinic next week by PCP. Refilled home cymbalta as well.

## 2021-07-13 NOTE — Assessment & Plan Note (Signed)
Refilled patient's norvasc and hydralazine. She will be coming for an in-person visit next week so can check BP at that time and adjust medications as needed.

## 2021-07-16 ENCOUNTER — Inpatient Hospital Stay (HOSPITAL_COMMUNITY)
Admission: EM | Admit: 2021-07-16 | Discharge: 2021-07-31 | DRG: 870 | Disposition: E | Payer: Self-pay | Attending: Critical Care Medicine | Admitting: Critical Care Medicine

## 2021-07-16 ENCOUNTER — Emergency Department (HOSPITAL_COMMUNITY): Payer: Self-pay

## 2021-07-16 ENCOUNTER — Encounter (HOSPITAL_COMMUNITY): Payer: Self-pay | Admitting: Oncology

## 2021-07-16 ENCOUNTER — Other Ambulatory Visit: Payer: Self-pay

## 2021-07-16 DIAGNOSIS — N185 Chronic kidney disease, stage 5: Secondary | ICD-10-CM | POA: Diagnosis present

## 2021-07-16 DIAGNOSIS — L8990 Pressure ulcer of unspecified site, unspecified stage: Secondary | ICD-10-CM | POA: Insufficient documentation

## 2021-07-16 DIAGNOSIS — Z888 Allergy status to other drugs, medicaments and biological substances status: Secondary | ICD-10-CM

## 2021-07-16 DIAGNOSIS — Z882 Allergy status to sulfonamides status: Secondary | ICD-10-CM

## 2021-07-16 DIAGNOSIS — I43 Cardiomyopathy in diseases classified elsewhere: Secondary | ICD-10-CM | POA: Diagnosis present

## 2021-07-16 DIAGNOSIS — M1990 Unspecified osteoarthritis, unspecified site: Secondary | ICD-10-CM | POA: Diagnosis present

## 2021-07-16 DIAGNOSIS — E874 Mixed disorder of acid-base balance: Secondary | ICD-10-CM | POA: Diagnosis present

## 2021-07-16 DIAGNOSIS — G9341 Metabolic encephalopathy: Secondary | ICD-10-CM | POA: Diagnosis present

## 2021-07-16 DIAGNOSIS — L89616 Pressure-induced deep tissue damage of right heel: Secondary | ICD-10-CM | POA: Diagnosis present

## 2021-07-16 DIAGNOSIS — Z8279 Family history of other congenital malformations, deformations and chromosomal abnormalities: Secondary | ICD-10-CM

## 2021-07-16 DIAGNOSIS — Z0184 Encounter for antibody response examination: Secondary | ICD-10-CM

## 2021-07-16 DIAGNOSIS — R1310 Dysphagia, unspecified: Secondary | ICD-10-CM | POA: Diagnosis present

## 2021-07-16 DIAGNOSIS — E1142 Type 2 diabetes mellitus with diabetic polyneuropathy: Secondary | ICD-10-CM | POA: Diagnosis present

## 2021-07-16 DIAGNOSIS — E890 Postprocedural hypothyroidism: Secondary | ICD-10-CM | POA: Diagnosis present

## 2021-07-16 DIAGNOSIS — Z823 Family history of stroke: Secondary | ICD-10-CM

## 2021-07-16 DIAGNOSIS — R6521 Severe sepsis with septic shock: Secondary | ICD-10-CM | POA: Diagnosis present

## 2021-07-16 DIAGNOSIS — E1122 Type 2 diabetes mellitus with diabetic chronic kidney disease: Secondary | ICD-10-CM | POA: Diagnosis present

## 2021-07-16 DIAGNOSIS — Z978 Presence of other specified devices: Secondary | ICD-10-CM

## 2021-07-16 DIAGNOSIS — J9601 Acute respiratory failure with hypoxia: Secondary | ICD-10-CM

## 2021-07-16 DIAGNOSIS — E872 Acidosis, unspecified: Secondary | ICD-10-CM

## 2021-07-16 DIAGNOSIS — E039 Hypothyroidism, unspecified: Secondary | ICD-10-CM

## 2021-07-16 DIAGNOSIS — Z79899 Other long term (current) drug therapy: Secondary | ICD-10-CM

## 2021-07-16 DIAGNOSIS — D631 Anemia in chronic kidney disease: Secondary | ICD-10-CM | POA: Diagnosis present

## 2021-07-16 DIAGNOSIS — I5033 Acute on chronic diastolic (congestive) heart failure: Secondary | ICD-10-CM | POA: Diagnosis not present

## 2021-07-16 DIAGNOSIS — R0902 Hypoxemia: Secondary | ICD-10-CM

## 2021-07-16 DIAGNOSIS — R34 Anuria and oliguria: Secondary | ICD-10-CM | POA: Diagnosis not present

## 2021-07-16 DIAGNOSIS — F1120 Opioid dependence, uncomplicated: Secondary | ICD-10-CM | POA: Diagnosis present

## 2021-07-16 DIAGNOSIS — U071 COVID-19: Secondary | ICD-10-CM

## 2021-07-16 DIAGNOSIS — J159 Unspecified bacterial pneumonia: Secondary | ICD-10-CM | POA: Diagnosis not present

## 2021-07-16 DIAGNOSIS — E876 Hypokalemia: Secondary | ICD-10-CM | POA: Diagnosis not present

## 2021-07-16 DIAGNOSIS — H9191 Unspecified hearing loss, right ear: Secondary | ICD-10-CM | POA: Diagnosis present

## 2021-07-16 DIAGNOSIS — A4189 Other specified sepsis: Principal | ICD-10-CM | POA: Diagnosis present

## 2021-07-16 DIAGNOSIS — D649 Anemia, unspecified: Secondary | ICD-10-CM

## 2021-07-16 DIAGNOSIS — Z515 Encounter for palliative care: Secondary | ICD-10-CM

## 2021-07-16 DIAGNOSIS — E854 Organ-limited amyloidosis: Secondary | ICD-10-CM | POA: Diagnosis present

## 2021-07-16 DIAGNOSIS — D573 Sickle-cell trait: Secondary | ICD-10-CM | POA: Diagnosis present

## 2021-07-16 DIAGNOSIS — F05 Delirium due to known physiological condition: Secondary | ICD-10-CM | POA: Diagnosis present

## 2021-07-16 DIAGNOSIS — J440 Chronic obstructive pulmonary disease with acute lower respiratory infection: Secondary | ICD-10-CM | POA: Diagnosis present

## 2021-07-16 DIAGNOSIS — I161 Hypertensive emergency: Secondary | ICD-10-CM | POA: Diagnosis not present

## 2021-07-16 DIAGNOSIS — N390 Urinary tract infection, site not specified: Secondary | ICD-10-CM | POA: Diagnosis present

## 2021-07-16 DIAGNOSIS — E43 Unspecified severe protein-calorie malnutrition: Secondary | ICD-10-CM | POA: Diagnosis present

## 2021-07-16 DIAGNOSIS — E1165 Type 2 diabetes mellitus with hyperglycemia: Secondary | ICD-10-CM | POA: Diagnosis not present

## 2021-07-16 DIAGNOSIS — Z0189 Encounter for other specified special examinations: Secondary | ICD-10-CM

## 2021-07-16 DIAGNOSIS — F32A Depression, unspecified: Secondary | ICD-10-CM | POA: Diagnosis present

## 2021-07-16 DIAGNOSIS — Z8249 Family history of ischemic heart disease and other diseases of the circulatory system: Secondary | ICD-10-CM

## 2021-07-16 DIAGNOSIS — Z833 Family history of diabetes mellitus: Secondary | ICD-10-CM

## 2021-07-16 DIAGNOSIS — N19 Unspecified kidney failure: Secondary | ICD-10-CM

## 2021-07-16 DIAGNOSIS — R339 Retention of urine, unspecified: Secondary | ICD-10-CM | POA: Diagnosis not present

## 2021-07-16 DIAGNOSIS — J690 Pneumonitis due to inhalation of food and vomit: Secondary | ICD-10-CM | POA: Diagnosis not present

## 2021-07-16 DIAGNOSIS — Z825 Family history of asthma and other chronic lower respiratory diseases: Secondary | ICD-10-CM

## 2021-07-16 DIAGNOSIS — I132 Hypertensive heart and chronic kidney disease with heart failure and with stage 5 chronic kidney disease, or end stage renal disease: Secondary | ICD-10-CM | POA: Diagnosis present

## 2021-07-16 DIAGNOSIS — J1282 Pneumonia due to coronavirus disease 2019: Secondary | ICD-10-CM | POA: Diagnosis present

## 2021-07-16 DIAGNOSIS — G8929 Other chronic pain: Secondary | ICD-10-CM | POA: Diagnosis present

## 2021-07-16 DIAGNOSIS — Z794 Long term (current) use of insulin: Secondary | ICD-10-CM

## 2021-07-16 DIAGNOSIS — L89152 Pressure ulcer of sacral region, stage 2: Secondary | ICD-10-CM | POA: Diagnosis present

## 2021-07-16 DIAGNOSIS — D696 Thrombocytopenia, unspecified: Secondary | ICD-10-CM | POA: Diagnosis present

## 2021-07-16 DIAGNOSIS — Z66 Do not resuscitate: Secondary | ICD-10-CM

## 2021-07-16 DIAGNOSIS — N179 Acute kidney failure, unspecified: Secondary | ICD-10-CM | POA: Diagnosis present

## 2021-07-16 DIAGNOSIS — Z4659 Encounter for fitting and adjustment of other gastrointestinal appliance and device: Secondary | ICD-10-CM

## 2021-07-16 DIAGNOSIS — Z9911 Dependence on respirator [ventilator] status: Secondary | ICD-10-CM

## 2021-07-16 DIAGNOSIS — D62 Acute posthemorrhagic anemia: Secondary | ICD-10-CM | POA: Diagnosis not present

## 2021-07-16 DIAGNOSIS — J9690 Respiratory failure, unspecified, unspecified whether with hypoxia or hypercapnia: Secondary | ICD-10-CM

## 2021-07-16 DIAGNOSIS — E785 Hyperlipidemia, unspecified: Secondary | ICD-10-CM | POA: Diagnosis present

## 2021-07-16 DIAGNOSIS — Z6828 Body mass index (BMI) 28.0-28.9, adult: Secondary | ICD-10-CM

## 2021-07-16 DIAGNOSIS — E035 Myxedema coma: Secondary | ICD-10-CM

## 2021-07-16 DIAGNOSIS — M797 Fibromyalgia: Secondary | ICD-10-CM | POA: Diagnosis present

## 2021-07-16 DIAGNOSIS — T380X5A Adverse effect of glucocorticoids and synthetic analogues, initial encounter: Secondary | ICD-10-CM | POA: Diagnosis not present

## 2021-07-16 DIAGNOSIS — R627 Adult failure to thrive: Secondary | ICD-10-CM | POA: Diagnosis present

## 2021-07-16 DIAGNOSIS — Z79891 Long term (current) use of opiate analgesic: Secondary | ICD-10-CM

## 2021-07-16 DIAGNOSIS — Z885 Allergy status to narcotic agent status: Secondary | ICD-10-CM

## 2021-07-16 DIAGNOSIS — Z91040 Latex allergy status: Secondary | ICD-10-CM

## 2021-07-16 DIAGNOSIS — Z781 Physical restraint status: Secondary | ICD-10-CM

## 2021-07-16 LAB — URINALYSIS, ROUTINE W REFLEX MICROSCOPIC
Bilirubin Urine: NEGATIVE
Glucose, UA: NEGATIVE mg/dL
Ketones, ur: NEGATIVE mg/dL
Nitrite: NEGATIVE
Protein, ur: >=300 mg/dL — AB
Specific Gravity, Urine: 1.011 (ref 1.005–1.030)
pH: 7 (ref 5.0–8.0)

## 2021-07-16 LAB — MRSA NEXT GEN BY PCR, NASAL: MRSA by PCR Next Gen: NOT DETECTED

## 2021-07-16 LAB — CBC WITH DIFFERENTIAL/PLATELET
Abs Immature Granulocytes: 0.07 10*3/uL (ref 0.00–0.07)
Basophils Absolute: 0 10*3/uL (ref 0.0–0.1)
Basophils Relative: 0 %
Eosinophils Absolute: 0 10*3/uL (ref 0.0–0.5)
Eosinophils Relative: 0 %
HCT: 9.4 % — ABNORMAL LOW (ref 36.0–46.0)
Hemoglobin: 3 g/dL — CL (ref 12.0–15.0)
Immature Granulocytes: 1 %
Lymphocytes Relative: 4 %
Lymphs Abs: 0.3 10*3/uL — ABNORMAL LOW (ref 0.7–4.0)
MCH: 31.3 pg (ref 26.0–34.0)
MCHC: 31.9 g/dL (ref 30.0–36.0)
MCV: 97.9 fL (ref 80.0–100.0)
Monocytes Absolute: 0.2 10*3/uL (ref 0.1–1.0)
Monocytes Relative: 2 %
Neutro Abs: 6.7 10*3/uL (ref 1.7–7.7)
Neutrophils Relative %: 93 %
Platelets: 128 10*3/uL — ABNORMAL LOW (ref 150–400)
RBC: 0.96 MIL/uL — ABNORMAL LOW (ref 3.87–5.11)
RDW: 16.6 % — ABNORMAL HIGH (ref 11.5–15.5)
WBC Morphology: INCREASED
WBC: 7.2 10*3/uL (ref 4.0–10.5)
nRBC: 1 % — ABNORMAL HIGH (ref 0.0–0.2)

## 2021-07-16 LAB — BLOOD GAS, ARTERIAL
Acid-base deficit: 19.1 mmol/L — ABNORMAL HIGH (ref 0.0–2.0)
Bicarbonate: 8.5 mmol/L — ABNORMAL LOW (ref 20.0–28.0)
Drawn by: 22052
FIO2: 100
MECHVT: 450 mL
O2 Saturation: 88.5 %
PEEP: 5 cmH2O
Patient temperature: 98.6
RATE: 15 resp/min
pCO2 arterial: 28.3 mmHg — ABNORMAL LOW (ref 32.0–48.0)
pH, Arterial: 7.104 — CL (ref 7.350–7.450)
pO2, Arterial: 67.5 mmHg — ABNORMAL LOW (ref 83.0–108.0)

## 2021-07-16 LAB — I-STAT BETA HCG BLOOD, ED (MC, WL, AP ONLY): I-stat hCG, quantitative: <5 m[IU]/mL (ref <5)

## 2021-07-16 LAB — COMPREHENSIVE METABOLIC PANEL
ALT: 11 U/L (ref 0–44)
AST: 29 U/L (ref 15–41)
Albumin: 3.1 g/dL — ABNORMAL LOW (ref 3.5–5.0)
Alkaline Phosphatase: 108 U/L (ref 38–126)
Anion gap: 13 (ref 5–15)
BUN: 84 mg/dL — ABNORMAL HIGH (ref 6–20)
CO2: 9 mmol/L — ABNORMAL LOW (ref 22–32)
Calcium: 7 mg/dL — ABNORMAL LOW (ref 8.9–10.3)
Chloride: 116 mmol/L — ABNORMAL HIGH (ref 98–111)
Creatinine, Ser: 5.34 mg/dL — ABNORMAL HIGH (ref 0.44–1.00)
GFR, Estimated: 9 mL/min — ABNORMAL LOW (ref >60)
Glucose, Bld: 130 mg/dL — ABNORMAL HIGH (ref 70–99)
Potassium: 4.6 mmol/L (ref 3.5–5.1)
Sodium: 138 mmol/L (ref 135–145)
Total Bilirubin: 0.8 mg/dL (ref 0.3–1.2)
Total Protein: 5.7 g/dL — ABNORMAL LOW (ref 6.5–8.1)

## 2021-07-16 LAB — PREPARE RBC (CROSSMATCH)

## 2021-07-16 LAB — PROTIME-INR
INR: 1.3 — ABNORMAL HIGH (ref 0.8–1.2)
Prothrombin Time: 16.5 seconds — ABNORMAL HIGH (ref 11.4–15.2)

## 2021-07-16 LAB — SALICYLATE LEVEL: Salicylate Lvl: 14.3 mg/dL (ref 7.0–30.0)

## 2021-07-16 LAB — LACTIC ACID, PLASMA
Lactic Acid, Venous: 4.3 mmol/L (ref 0.5–1.9)
Lactic Acid, Venous: 2.4 mmol/L (ref 0.5–1.9)

## 2021-07-16 LAB — POC OCCULT BLOOD, ED: Fecal Occult Bld: NEGATIVE

## 2021-07-16 LAB — RESP PANEL BY RT-PCR (FLU A&B, COVID) ARPGX2
Influenza A by PCR: NEGATIVE
Influenza B by PCR: NEGATIVE
SARS Coronavirus 2 by RT PCR: POSITIVE — AB

## 2021-07-16 LAB — APTT: aPTT: 52 seconds — ABNORMAL HIGH (ref 24–36)

## 2021-07-16 LAB — TSH: TSH: 140.295 u[IU]/mL — ABNORMAL HIGH (ref 0.350–4.500)

## 2021-07-16 LAB — GLUCOSE, CAPILLARY: Glucose-Capillary: 177 mg/dL — ABNORMAL HIGH (ref 70–99)

## 2021-07-16 LAB — ETHANOL: Alcohol, Ethyl (B): <10 mg/dL (ref <10)

## 2021-07-16 MED ORDER — CHLORHEXIDINE GLUCONATE CLOTH 2 % EX PADS
6.0000 | MEDICATED_PAD | Freq: Every day | CUTANEOUS | Status: DC
  Administered 2021-07-18 – 2021-07-25 (×10): 6 via TOPICAL

## 2021-07-16 MED ORDER — PROPOFOL 1000 MG/100ML IV EMUL
5.0000 ug/kg/min | INTRAVENOUS | Status: DC
  Administered 2021-07-16: 15 ug/kg/min via INTRAVENOUS

## 2021-07-16 MED ORDER — PANTOPRAZOLE INFUSION (NEW) - SIMPLE MED
8.0000 mg/h | INTRAVENOUS | Status: DC
  Administered 2021-07-16 – 2021-07-17 (×2): 8 mg/h via INTRAVENOUS
  Filled 2021-07-16: qty 80
  Filled 2021-07-16: qty 100
  Filled 2021-07-16: qty 80
  Filled 2021-07-16: qty 100

## 2021-07-16 MED ORDER — HYDROCORTISONE SOD SUC (PF) 100 MG IJ SOLR
100.0000 mg | Freq: Three times a day (TID) | INTRAMUSCULAR | Status: DC
  Administered 2021-07-17 (×3): 100 mg via INTRAVENOUS
  Filled 2021-07-16 (×3): qty 2

## 2021-07-16 MED ORDER — METRONIDAZOLE 500 MG/100ML IV SOLN
500.0000 mg | Freq: Two times a day (BID) | INTRAVENOUS | Status: DC
  Administered 2021-07-17: 500 mg via INTRAVENOUS
  Filled 2021-07-16: qty 100

## 2021-07-16 MED ORDER — SUCCINYLCHOLINE CHLORIDE 200 MG/10ML IV SOSY
PREFILLED_SYRINGE | INTRAVENOUS | Status: AC
  Filled 2021-07-16: qty 10

## 2021-07-16 MED ORDER — LACTATED RINGERS IV BOLUS (SEPSIS)
250.0000 mL | Freq: Once | INTRAVENOUS | Status: AC
  Administered 2021-07-16: 250 mL via INTRAVENOUS

## 2021-07-16 MED ORDER — SODIUM CHLORIDE 0.9 % IV SOLN
100.0000 mg | Freq: Every day | INTRAVENOUS | Status: AC
  Administered 2021-07-18 – 2021-07-21 (×4): 100 mg via INTRAVENOUS
  Filled 2021-07-16 (×4): qty 20

## 2021-07-16 MED ORDER — SODIUM CHLORIDE 0.9 % IV SOLN: 200.0000 mg | Freq: Once | INTRAVENOUS | Status: DC

## 2021-07-16 MED ORDER — METRONIDAZOLE 500 MG/100ML IV SOLN
500.0000 mg | Freq: Once | INTRAVENOUS | Status: AC
  Administered 2021-07-16: 500 mg via INTRAVENOUS
  Filled 2021-07-16: qty 100

## 2021-07-16 MED ORDER — VANCOMYCIN HCL 500 MG/100ML IV SOLN
500.0000 mg | Freq: Once | INTRAVENOUS | Status: AC
  Administered 2021-07-17: 500 mg via INTRAVENOUS
  Filled 2021-07-16: qty 100

## 2021-07-16 MED ORDER — SODIUM CHLORIDE 0.9 % IV SOLN: 100.0000 mg | Freq: Every day | INTRAVENOUS | Status: DC

## 2021-07-16 MED ORDER — SODIUM CHLORIDE 0.9 % IV SOLN
1.0000 g | INTRAVENOUS | Status: DC
  Filled 2021-07-16: qty 1

## 2021-07-16 MED ORDER — INSULIN ASPART 100 UNIT/ML IJ SOLN
1.0000 [IU] | INTRAMUSCULAR | Status: DC
  Administered 2021-07-17: 2 [IU] via SUBCUTANEOUS
  Administered 2021-07-17: 1 [IU] via SUBCUTANEOUS
  Administered 2021-07-17: 2 [IU] via SUBCUTANEOUS
  Administered 2021-07-17 – 2021-07-18 (×3): 1 [IU] via SUBCUTANEOUS

## 2021-07-16 MED ORDER — ORAL CARE MOUTH RINSE
15.0000 mL | OROMUCOSAL | Status: DC
  Administered 2021-07-17 – 2021-07-23 (×67): 15 mL via OROMUCOSAL

## 2021-07-16 MED ORDER — CHLORHEXIDINE GLUCONATE 0.12% ORAL RINSE (MEDLINE KIT)
15.0000 mL | Freq: Two times a day (BID) | OROMUCOSAL | Status: DC
  Administered 2021-07-17 – 2021-07-25 (×17): 15 mL via OROMUCOSAL

## 2021-07-16 MED ORDER — SODIUM CHLORIDE 0.9 % IV SOLN
2.0000 g | Freq: Once | INTRAVENOUS | Status: AC
  Administered 2021-07-16: 2 g via INTRAVENOUS
  Filled 2021-07-16: qty 2

## 2021-07-16 MED ORDER — SODIUM CHLORIDE 0.9 % IV SOLN
3.6000 ug/h | INTRAVENOUS | Status: DC
  Administered 2021-07-16: 3.6 ug/h via INTRAVENOUS
  Filled 2021-07-16: qty 10

## 2021-07-16 MED ORDER — LACTATED RINGERS IV BOLUS (SEPSIS)
1000.0000 mL | Freq: Once | INTRAVENOUS | Status: AC
  Administered 2021-07-16: 1000 mL via INTRAVENOUS

## 2021-07-16 MED ORDER — SODIUM BICARBONATE 8.4 % IV SOLN
INTRAVENOUS | Status: DC
  Filled 2021-07-16 (×2): qty 150
  Filled 2021-07-16: qty 1000
  Filled 2021-07-16: qty 150

## 2021-07-16 MED ORDER — ROCURONIUM BROMIDE 50 MG/5ML IV SOLN
100.0000 mg | Freq: Once | INTRAVENOUS | Status: AC
  Administered 2021-07-16: 100 mg via INTRAVENOUS
  Filled 2021-07-16: qty 10

## 2021-07-16 MED ORDER — VANCOMYCIN HCL IN DEXTROSE 1-5 GM/200ML-% IV SOLN
1000.0000 mg | Freq: Once | INTRAVENOUS | Status: AC
  Administered 2021-07-16: 1000 mg via INTRAVENOUS
  Filled 2021-07-16: qty 200

## 2021-07-16 MED ORDER — LACTATED RINGERS IV SOLN: INTRAVENOUS | Status: AC

## 2021-07-16 MED ORDER — ETOMIDATE 2 MG/ML IV SOLN
INTRAVENOUS | Status: AC
  Administered 2021-07-16: 20 mg via INTRAVENOUS
  Filled 2021-07-16: qty 20

## 2021-07-16 MED ORDER — SODIUM CHLORIDE 0.9 % IV SOLN
10.0000 mL/h | Freq: Once | INTRAVENOUS | Status: DC
Start: 2021-07-16 — End: 2021-07-19

## 2021-07-16 MED ORDER — ETOMIDATE 2 MG/ML IV SOLN: 20.0000 mg | Freq: Once | INTRAVENOUS | Status: AC

## 2021-07-16 MED ORDER — SODIUM BICARBONATE 8.4 % IV SOLN
50.0000 meq | Freq: Once | INTRAVENOUS | Status: AC
  Administered 2021-07-16: 50 meq via INTRAVENOUS
  Filled 2021-07-16: qty 50

## 2021-07-16 MED ORDER — SODIUM CHLORIDE 0.9 % IV SOLN
100.0000 mg | Freq: Once | INTRAVENOUS | Status: AC
  Administered 2021-07-17: 100 mg via INTRAVENOUS
  Filled 2021-07-16: qty 20

## 2021-07-16 MED ORDER — PANTOPRAZOLE 80MG IVPB - SIMPLE MED
80.0000 mg | Freq: Once | INTRAVENOUS | Status: AC
  Administered 2021-07-16: 80 mg via INTRAVENOUS
  Filled 2021-07-16: qty 80

## 2021-07-16 MED ORDER — DOCUSATE SODIUM 50 MG/5ML PO LIQD
100.0000 mg | Freq: Two times a day (BID) | ORAL | Status: DC | PRN
  Filled 2021-07-16: qty 10

## 2021-07-16 MED ORDER — SODIUM CHLORIDE 0.9 % IV SOLN: 10.0000 mL/h | Freq: Once | INTRAVENOUS | Status: DC

## 2021-07-16 MED ORDER — FENTANYL 2500MCG IN NS 250ML (10MCG/ML) PREMIX INFUSION
0.0000 ug/h | INTRAVENOUS | Status: DC
  Administered 2021-07-16: 25 ug/h via INTRAVENOUS
  Administered 2021-07-17 – 2021-07-19 (×4): 100 ug/h via INTRAVENOUS
  Administered 2021-07-20: 125 ug/h via INTRAVENOUS
  Administered 2021-07-21: 75 ug/h via INTRAVENOUS
  Administered 2021-07-22: 200 ug/h via INTRAVENOUS
  Administered 2021-07-22: 125 ug/h via INTRAVENOUS
  Administered 2021-07-23: 200 ug/h via INTRAVENOUS
  Filled 2021-07-16 (×9): qty 250

## 2021-07-16 MED ORDER — LACTATED RINGERS IV BOLUS (SEPSIS)
500.0000 mL | Freq: Once | INTRAVENOUS | Status: AC
  Administered 2021-07-16: 500 mL via INTRAVENOUS

## 2021-07-16 MED ORDER — SODIUM BICARBONATE 8.4 % IV SOLN
INTRAVENOUS | Status: AC
  Filled 2021-07-16: qty 50

## 2021-07-16 MED ORDER — PROPOFOL 500 MG/50ML IV EMUL
INTRAVENOUS | Status: AC
  Administered 2021-07-16: 5 ug/kg/min via INTRAVENOUS
  Filled 2021-07-16: qty 50

## 2021-07-16 MED ORDER — SODIUM CHLORIDE 0.9% IV SOLUTION: Freq: Once | INTRAVENOUS | Status: DC

## 2021-07-16 MED ORDER — CALCIUM GLUCONATE-NACL 1-0.675 GM/50ML-% IV SOLN
1.0000 g | Freq: Once | INTRAVENOUS | Status: AC
  Administered 2021-07-17: 1000 mg via INTRAVENOUS
  Filled 2021-07-16: qty 50

## 2021-07-16 MED ORDER — PROPOFOL 1000 MG/100ML IV EMUL
5.0000 ug/kg/min | INTRAVENOUS | Status: AC
  Administered 2021-07-17: 5 ug/kg/min via INTRAVENOUS
  Administered 2021-07-18: 10 ug/kg/min via INTRAVENOUS
  Administered 2021-07-18: 15 ug/kg/min via INTRAVENOUS
  Administered 2021-07-18: 20 ug/kg/min via INTRAVENOUS
  Administered 2021-07-19: 10 ug/kg/min via INTRAVENOUS
  Filled 2021-07-16 (×5): qty 100

## 2021-07-16 MED ORDER — LEVOTHYROXINE SODIUM 100 MCG/5ML IV SOLN
200.0000 ug | Freq: Once | INTRAVENOUS | Status: AC
  Administered 2021-07-16: 200 ug via INTRAVENOUS
  Filled 2021-07-16: qty 10

## 2021-07-16 MED ORDER — LEVOTHYROXINE SODIUM 100 MCG/5ML IV SOLN
200.0000 ug | Freq: Every day | INTRAVENOUS | Status: DC
  Filled 2021-07-16: qty 10

## 2021-07-16 NOTE — Progress Notes (Signed)
Internal Medicine Clinic Attending  Case discussed with Dr. Jinwala  At the time of the visit.  We reviewed the resident's history and exam and pertinent patient test results.  I agree with the assessment, diagnosis, and plan of care documented in the resident's note.  

## 2021-07-16 NOTE — H&P (Signed)
NAME:  Kayla [name], MRN:  [id number], DOB:  28-Jun-1961, LOS: 0 ADMISSION DATE:  07/22/2021, CONSULTATION DATE:  07/23/2021 REFERRING MD:  Darl Householder, ED  CHIEF COMPLAINT:  altered, short of breath   History of Present Illness:  60 yo woman with hx of HTN, HLD, fibromyalgia, graves disease, DM 2, here with ams x 1 day.   EMS called by daughter, found to be hypoxemic.   In ED hypothermic.  Hypertensive.   In ED: intubated, started on vanc, flagyl and cefepime.  1L LR.  Protonix gtt.  PCP virtual visiton 07/13/21.  Worsening mobility over a few months.  Unable to refill medications.   Chronic pain.   7.104/28.3/67.5 AG 13, CO2 9 (baseline 15-16) Lactic 4.3 Hb 3  (BL 9-10) Neg stool hemoccult Pertinent  Medical History  Arthritis Hx of cellulitis COPD Deafness (r ear) DM  Peripheral neuropathy Fibromyalgia Hld HTN Hypothyroidism Graves dz hx Chronic met acidosis?   Meds; amlodipine, aranesp, hydralazine, dilaudid, sodium bicarb, insulin (glargine), topamax 25 qhs, venlafaxine XR 37.5.   Significant Hospital Events: Including procedures, antibiotic start and stop dates in addition to other pertinent events     Interim History / Subjective:   Objective   Blood pressure (!) 149/60, pulse 65, temperature (!) 90.9 F (32.7 C), temperature source Rectal, resp. rate (!) 21, last menstrual period 01/12/2014, SpO2 99 %.    Vent Mode: PRVC FiO2 (%):  [100 %] 100 % Set Rate:  [15 bmp] 15 bmp Vt Set:  [450 mL] 450 mL PEEP:  [5 cmH20] 5 cmH20 Plateau Pressure:  [17 cmH20] 17 cmH20   Intake/Output Summary (Last 24 hours) at 07/26/2021 1929 Last data filed at 07/02/2021 1928 Gross per 24 hour  Intake 1752.37 ml  Output --  Net 1752.37 ml   There were no vitals filed for this visit.  Examination: General: Intubated  HENT: exopthalmos B  Lungs: ctab Cardiovascular: RRR (60s) Abdomen: nt, nd, nbs  Extremities: non pitting edema in ankles B  Neuro: non responsive, on propofol   GU: Foley, clear urine  CXR:  IMPRESSION: 1. Endotracheal tube tip 4 cm above carina. 2. Enteric tube is coiled in the stomach with distal tip near the fundus. Consider repositioning. 3. Bilateral lower lung airspace disease, left greater than right. 4. Stable cardiomegaly.  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  Briefly bradycardic, now hr 60s.   AMS.   Intubated.  IV synthroid ordered.  Solucortef also ordered.  Check cortisol.  T4 pending.  Echo pending  Severe anemia: no obvious source of bleeding.  Hemoccult negative.  5 u prbc.  Recheck h/h after tx.    UTI - UA suggestive  PNA: L>R basilar consolidations.  []strep and legionella pending.  Broad spectrum abtx for now.   CKD: still making urine.   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 ]  Labs   CBC: Recent Labs  Lab 07/03/2021 1747  WBC 7.2  NEUTROABS PENDING  HGB 3.0*  HCT 9.4*  MCV 97.9  PLT 128*    Basic Metabolic Panel: Recent Labs  Lab 07/01/2021 1747  NA  138  K 4.6  CL 116*  CO2 9*  GLUCOSE 130*  BUN 84*  CREATININE 5.34*  CALCIUM 7.0*   GFR: CrCl cannot be calculated (Unknown ideal weight.). Recent Labs  Lab 07/22/2021 1747  WBC 7.2  LATICACIDVEN 4.3*    Liver Function Tests: Recent Labs  Lab 07/17/2021 1747  AST 29  ALT 11  ALKPHOS 108  BILITOT 0.8  PROT 5.7*  ALBUMIN 3.1*   No results for input(s): LIPASE, AMYLASE in the last 168 hours. No results for input(s): AMMONIA in the last 168 hours.  ABG    Component Value Date/Time   PHART 7.104 (LL) 07/22/2021  1832   PCO2ART 28.3 (L) 07/04/2021 1832   PO2ART 67.5 (L) 07/09/2021 1832   HCO3 8.5 (L) 07/26/2021 1832   TCO2 29.2 08/17/2012 2330   ACIDBASEDEF 19.1 (H) 07/24/2021 1832   O2SAT 88.5 07/23/2021 1832     Coagulation Profile: Recent Labs  Lab 07/15/2021 1747  INR 1.3*    Cardiac Enzymes: No results for input(s): CKTOTAL, CKMB, CKMBINDEX, TROPONINI in the last 168 hours.  HbA1C: Hemoglobin A1C  Date/Time Value Ref Range Status  04/05/2021 02:15 PM 5.4 4.0 - 5.6 % Final  03/27/2020 03:15 PM 5.2 4.0 - 5.6 % Final   Hgb A1c MFr Bld  Date/Time Value Ref Range Status  04/26/2019 09:10 PM 5.0 4.8 - 5.6 % Final    Comment:    (NOTE) Pre diabetes:          5.7%-6.4% Diabetes:              >6.4% Glycemic control for   <7.0% adults with diabetes   06/27/2014 05:25 PM 9.7 (H) <5.7 % Final    Comment:    (NOTE)                                                                       According to the ADA Clinical Practice Recommendations for 2011, when HbA1c is used as a screening test:  >=6.5%   Diagnostic of Diabetes Mellitus           (if abnormal result is confirmed) 5.7-6.4%   Increased risk of developing Diabetes Mellitus References:Diagnosis and Classification of Diabetes Mellitus,Diabetes WJXB,1478,29(FAOZH 1):S62-S69 and Standards of Medical Care in         Diabetes - 2011,Diabetes Care,2011,34 (Suppl 1):S11-S61.    CBG: No results for input(s): GLUCAP in the last 168 hours.  Review of Systems:   Unable to assess  Past Medical History:  She,  has a past medical history of ANA POSITIVE (02/09/2010), Anal warts (11/09/2012), Anxiety, Arthritis, CAP (community acquired pneumonia) (04/29/2019), Cellulitis (06/27/2014), Chronic bronchitis ([place]), Complication of anesthesia, COPD (chronic obstructive pulmonary disease) ([place]), Deafness in right ear (since 2012), Diabetic peripheral neuropathy ([place]) (since <2005), Dizziness, Fibromyalgia, Graves' disease, Headache(784.0), Heart  murmur, Helicobacter infection (07/03/2019), Hyperlipidemia, Hypertension, Hypothyroidism, Insomnia (07/31/2006), Multiple falls (08/17/2012), Pain and swelling of left ankle (08/07/2017), Proteinuria (07/31/2006), PUD (peptic ulcer disease) (07/03/2019), Sickle cell trait ([place]), Trigger finger, left middle finger (12/15/2017), Trimalleolar fracture of left ankle (11/21/2014), Type 2 diabetes mellitus ([place]), Urinary urgency (02/26/2019), Vaginal odor (04/08/2021), Weakness (04/26/2019), and Wears glasses.   Surgical History:   Past Surgical History:  Procedure Laterality Date  ANKLE FRACTURE SURGERY Left 2008   "didn't put hardware in"   [place]  04/28/2019   Procedure: BIOPSY;  Surgeon: Jerene Bears, MD;  Location: [facility];  Service: Gastroenterology;;   [place]  1990's   ESOPHAGOGASTRODUODENOSCOPY (EGD) WITH PROPOFOL N/A 04/28/2019   Procedure: ESOPHAGOGASTRODUODENOSCOPY (EGD) WITH PROPOFOL;  Surgeon: Jerene Bears, MD;  Location: [facility];  Service: Gastroenterology;  Laterality: N/A;   FRACTURE SURGERY     INCISION AND DRAINAGE ABSCESS Left 07/01/2014   Procedure: INCISION AND DRAINAGE ABSCESS OF LEFT THIGH;  Surgeon: Georganna Skeans, MD;  Location: [facility];  Service: General;  Laterality: Left;   ORIF ANKLE FRACTURE Left 11/21/2014   Procedure: OPEN REDUCTION INTERNAL FIXATION (ORIF) TRI MALLEOLAR FRACTURE with retinacular repair;  Surgeon: Johnny Bridge, MD;  Location: [facility];  Service: Orthopedics;  Laterality: Left;     Social History:   reports that she quit smoking about 30 years ago. Her smoking use included cigarettes. She has a 0.50 pack-year smoking history. She has never used smokeless tobacco. She reports that she does not drink alcohol and does not use drugs.   Family History:  Her family history includes Cancer in her father, maternal aunt, mother, and another family member; Diabetes  in her father and sister; Down syndrome in her brother; Emphysema in her mother; Hypertension in her father and mother; Stroke in her sister. There is no history of Colon cancer, Stomach cancer, Esophageal cancer, Inflammatory bowel disease, Liver disease, Pancreatic cancer, or Rectal cancer.   Allergies Allergies  Allergen Reactions   Ace Inhibitors     Dizziness, nausea   Percocet [Oxycodone-Acetaminophen] Itching   Statins    Latex Rash   Sulfamethoxazole Rash   Sulfites Rash     Home Medications  Prior to Admission medications   Medication Sig Start Date End Date Taking? Authorizing Provider  acetaminophen (TYLENOL) 325 MG tablet Take 2 tablets (650 mg total) by mouth every 6 (six) hours as needed for mild pain (or Fever >/= 101). Patient not taking: Reported on 05/22/2021 03/19/21   Riesa Pope, MD  amLODipine (NORVASC) 10 MG tablet Take 1 tablet (10 mg total) by mouth daily. 07/13/21   Virl Axe, MD  Blood Glucose Monitoring Suppl (ACCU-CHEK AVIVA PLUS) W/DEVICE KIT 1 kit by Does not apply route as directed. 05/15/11   Bartholomew Crews, MD  cetaphil (CETAPHIL) lotion Apply 1 application topically as needed for dry skin. 04/05/21   Sanjuan Dame, MD  Darbepoetin Alfa (ARANESP) 60 MCG/0.3ML SOSY injection Inject 0.3 mLs (60 mcg total) into the skin every Saturday at 6 PM. Patient not taking: Reported on 05/22/2021 03/24/21   Riesa Pope, MD  hydrALAZINE (APRESOLINE) 25 MG tablet Take 1 tablet (25 mg total) by mouth every 8 (eight) hours. 07/13/21   Virl Axe, MD  HYDROmorphone (DILAUDID) 2 MG tablet Take 1 tablet (2 mg total) by mouth daily as needed for up to 7 days for severe pain. 07/13/21 07/23/21  Virl Axe, MD  Insulin Glargine (BASAGLAR KWIKPEN) 100 UNIT/ML SOPN Inject 0.2 mLs (20 Units total) into the skin daily. Patient not taking: Reported on 05/22/2021 05/28/19   Asencion Noble, MD  Lancet Devices [facility]) lancets Use as  instructed Patient not taking: Reported on 05/22/2021 10/21/16   Dellia Nims, MD  metroNIDAZOLE (METROGEL) 0.75 % gel Apply 1 application topically 2 (two) times daily. 05/28/21  Masters, Katie, DO  sodium bicarbonate 650 MG tablet Take 2 tablets (1,300 mg total) by mouth 2 (two) times daily. 07/13/21   Virl Axe, MD  topiramate (TOPAMAX) 25 MG tablet Take 1 tablet (25 mg total) by mouth at bedtime. 07/13/21   Virl Axe, MD  venlafaxine XR (EFFEXOR XR) 37.5 MG 24 hr capsule Take 1 capsule (37.5 mg total) by mouth daily. 07/13/21   Virl Axe, MD     Critical care time: 60 min.

## 2021-07-16 NOTE — ED Triage Notes (Signed)
Pt bib [facility] from home.  Pt found down by daughter, unknown time unresponsive. Pt is obtunded, agitated and edematous.

## 2021-07-16 NOTE — Progress Notes (Signed)
Pt transported to ICU from ED on vent with BVM present. ETT secured. No complications noted at this time.

## 2021-07-16 NOTE — Progress Notes (Signed)
Elink following for code sepsis 

## 2021-07-16 NOTE — Consult Note (Signed)
[facility]  INPATIENT CONSULTATION  Reason for Consultation: AKI on CKD Requesting Provider: Dr. Duwayne Heck  HPI: Kayla [name] is an 60 y.o. female with HTN, HL, DM, h/o Grave's disease, COPD, h/o +ANA, fibromyalgia who presented to the ED today with AMS and hypoxia; nephrology is consulted regarding evaluation and management of AKI on CKD.   Has had progressive decline recently and has become nonambulatory and unable to come to clinic visits due to transportation.  3 days ago daughter contacted PCP with these issues; no meds taken for some time and arrangements being made for outpt care.  Came in to [facility] ED today via EMS after daughter called due to AMS.  Found to be hypoxic, hypothermic and was intubated in the ED.  Labs showing Hb 3 for which she's being transfused now.  No obvious active bleeding currently on unit 3 of 5 ordered.  ABG 7.10 / 28 / 67 lactate initially 4.3 > 2.4. CXR with bibasilar infiltrates.  Na 138, K 4.6, Bicarb 9, BUN 84, Cr 5.3, Alb 3.1.  Last Cr for comparison 04/05/21 5.4 and was 6.8 in 02/2021.   Son bedside and d/w daughter who it seems is main caregiver on phone.  Say followed with nephrologist for CKD but was scared to do dialysis despite recommendations for such as mom and sister had both passed with complications related to ESRD.  Other family members also with ESRD.   PMH: Past Medical History:  Diagnosis Date   ANA POSITIVE 02/09/2010   Anal warts 11/09/2012   Anxiety    Arthritis    "my bones ache all the time" (06/27/2014)   CAP (community acquired pneumonia) 04/29/2019   Cellulitis 06/27/2014   medial proximal left thigh extending to the perineium   Chronic bronchitis ([place])    "get it changing of the seasons; sometimes q yr; sometimes not"    Complication of anesthesia    sats drop post op   COPD (chronic obstructive pulmonary disease) ([place])    Deafness in 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"   Diabetic peripheral neuropathy ([place])  since <2005   "hands and feet"   Dizziness    Multiple falls at home   Fibromyalgia    Graves' disease    "they nuked it twice"   Headache(784.0)    "monthly" (06/27/2014)   Heart murmur    as a child-never had echo to evaluate   Hx of Helicobacter infection 07/03/2019   Hyperlipidemia    Hypertension    Hypothyroidism    Insomnia 07/31/2006   Annotation: dx  1999 Qualifier: Diagnosis of  By: Sharlet Salina MD, [place]     Multiple falls 08/17/2012   Pain and swelling of left ankle 08/07/2017   Proteinuria 07/31/2006   Annotation: 24 hour urine '204mg'$  / 24 hours   (11/03) Qualifier: Diagnosis of  By: Sharlet Salina MD, [place]     PUD (peptic ulcer disease) 07/03/2019   Sickle cell trait ([place])    Trigger finger, left middle finger 12/15/2017   Trimalleolar fracture of left ankle 11/21/2014   Type 2 diabetes mellitus ([place])    Urinary urgency 02/26/2019   Vaginal odor 04/08/2021   Weakness 04/26/2019   Wears glasses    PSH: Past Surgical History:  Procedure Laterality Date   ANKLE FRACTURE SURGERY Left 2008   "didn't put hardware in"   [place]  04/28/2019   Procedure: BIOPSY;  Surgeon: Jerene Bears, MD;  Location: [facility]  ENDOSCOPY;  Service: Gastroenterology;;   [place] OF UTERUS  1990's   ESOPHAGOGASTRODUODENOSCOPY (EGD) WITH PROPOFOL N/A 04/28/2019   Procedure: ESOPHAGOGASTRODUODENOSCOPY (EGD) WITH PROPOFOL;  Surgeon: Jerene Bears, MD;  Location: [place];  Service: Gastroenterology;  Laterality: N/A;   FRACTURE SURGERY     INCISION AND DRAINAGE ABSCESS Left 07/01/2014   Procedure: INCISION AND DRAINAGE ABSCESS OF LEFT THIGH;  Surgeon: Georganna Skeans, MD;  Location: [place];  Service: General;  Laterality: Left;   ORIF ANKLE FRACTURE Left 11/21/2014   Procedure: OPEN REDUCTION INTERNAL FIXATION (ORIF) TRI MALLEOLAR FRACTURE with retinacular repair;  Surgeon: Johnny Bridge, MD;  Location: [place];  Service:  Orthopedics;  Laterality: Left;     Past Medical History:  Diagnosis Date   ANA POSITIVE 02/09/2010   Anal warts 11/09/2012   Anxiety    Arthritis    "my bones ache all the time" (06/27/2014)   CAP (community acquired pneumonia) 04/29/2019   Cellulitis 06/27/2014   medial proximal left thigh extending to the perineium   Chronic bronchitis ([place])    "get it changing of the seasons; sometimes q yr; sometimes not"    Complication of anesthesia    sats drop post op   COPD (chronic obstructive pulmonary disease) ([place])    Deafness in 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"   Diabetic peripheral neuropathy ([place]) since <2005   "hands and feet"   Dizziness    Multiple falls at home   Fibromyalgia    Graves' disease    "they nuked it twice"   Headache(784.0)    "monthly" (06/27/2014)   Heart murmur    as a child-never had echo to evaluate   Hx of Helicobacter infection 07/03/2019   Hyperlipidemia    Hypertension    Hypothyroidism    Insomnia 07/31/2006   Annotation: dx  1999 Qualifier: Diagnosis of  By: Sharlet Salina MD, [place]     Multiple falls 08/17/2012   Pain and swelling of left ankle 08/07/2017   Proteinuria 07/31/2006   Annotation: 24 hour urine '204mg'$  / 24 hours   (11/03) Qualifier: Diagnosis of  By: Sharlet Salina MD, [place]     PUD (peptic ulcer disease) 07/03/2019   Sickle cell trait ([place])    Trigger finger, left middle finger 12/15/2017   Trimalleolar fracture of left ankle 11/21/2014   Type 2 diabetes mellitus ([place])    Urinary urgency 02/26/2019   Vaginal odor 04/08/2021   Weakness 04/26/2019   Wears glasses     Medications:  I have reviewed the patient's current medications. No recent home meds, was out  (Not in a hospital admission)   ALLERGIES:   Allergies  Allergen Reactions   Ace Inhibitors     Dizziness, nausea   Percocet [Oxycodone-Acetaminophen] Itching   Statins Itching   Latex Rash   Sulfamethoxazole Rash   Sulfites Rash    FAM HX: Family History   Problem Relation Age of Onset   Emphysema Mother    Cancer Mother        unknown   Hypertension Mother    Cancer Father        pancreatic    Diabetes Father    Hypertension Father    Down syndrome Brother    Cancer Maternal Aunt        cancer   Cancer Other        breast cancer aunt   Stroke Sister  Diabetes Sister    Colon cancer Neg Hx    Stomach cancer Neg Hx    Esophageal cancer Neg Hx    Inflammatory bowel disease Neg Hx    Liver disease Neg Hx    Pancreatic cancer Neg Hx    Rectal cancer Neg Hx     Social History:   reports that she quit smoking about 30 years ago. Her smoking use included cigarettes. She has a 0.50 pack-year smoking history. She has never used smokeless tobacco. She reports that she does not drink alcohol and does not use drugs.  ROS: unable to obtain from pt given intubated sedated  Blood pressure (!) 138/57, pulse (!) 54, temperature (!) 92.3 F (33.5 C), resp. rate 20, last menstrual period 01/12/2014, SpO2 100 %. PHYSICAL EXAM: Gen: lying flat on vent on propofol  Eyes: anicteric ENT: ETT and OG in place Neck: supple CV: brady, regular, no rub Abd: soft, nontender with palpation Lungs: coarse BL on vent at 80% FiO2 GU: foley in place draining some amber urine Extr: 1+ pedal edema Neuro: sedated Skin: cool and dry, no rashes   Results for orders placed or performed during the hospital encounter of 07/10/2021 (from the past 48 hour(s))  Urinalysis, Routine w reflex microscopic     Status: Abnormal   Collection Time: 07/15/2021  4:56 PM  Result Value Ref Range   Color, Urine YELLOW YELLOW   APPearance CLOUDY (A) CLEAR   Specific Gravity, Urine 1.011 1.005 - 1.030   pH 7.0 5.0 - 8.0   Glucose, UA NEGATIVE NEGATIVE mg/dL   Hgb urine dipstick SMALL (A) NEGATIVE   Bilirubin Urine NEGATIVE NEGATIVE   Ketones, ur NEGATIVE NEGATIVE mg/dL   Protein, ur >=300 (A) NEGATIVE mg/dL   Nitrite NEGATIVE NEGATIVE   Leukocytes,Ua SMALL (A) NEGATIVE    RBC / HPF 0-5 0 - 5 RBC/hpf   WBC, UA 21-50 0 - 5 WBC/hpf   Bacteria, UA MANY (A) NONE SEEN   Squamous Epithelial / LPF 0-5 0 - 5   WBC Clumps PRESENT    Mucus PRESENT    Non Squamous Epithelial 0-5 (A) NONE SEEN    Comment: Performed at [facility], [place] [address] [place], [place] [address]  Lactic acid, plasma     Status: Abnormal   Collection Time: 07/15/2021  5:47 PM  Result Value Ref Range   Lactic Acid, Venous 4.3 (HH) 0.5 - 1.9 mmol/L    Comment: CRITICAL RESULT CALLED TO, READ BACK BY AND VERIFIED WITH: CANDICE KNUCKLES RN.'@1910'$  ON 10.17.22 BY TCALDWELL MT. Performed at [facility], [place] [address], [place], [place] [address]   Comprehensive metabolic panel     Status: Abnormal   Collection Time: 06/30/2021  5:47 PM  Result Value Ref Range   Sodium 138 135 - 145 mmol/L   Potassium 4.6 3.5 - 5.1 mmol/L   Chloride 116 (H) 98 - 111 mmol/L   CO2 9 (L) 22 - 32 mmol/L   Glucose, Bld 130 (H) 70 - 99 mg/dL    Comment: Glucose reference range applies only to samples taken after fasting for at least 8 hours.   BUN 84 (H) 6 - 20 mg/dL   Creatinine, Ser 5.34 (H) 0.44 - 1.00 mg/dL   Calcium 7.0 (L) 8.9 - 10.3 mg/dL   Total Protein 5.7 (L) 6.5 - 8.1 g/dL   Albumin 3.1 (L) 3.5 - 5.0 g/dL   AST 29 15 - 41 U/L   ALT  11 0 - 44 U/L   Alkaline Phosphatase 108 38 - 126 U/L   Total Bilirubin 0.8 0.3 - 1.2 mg/dL   GFR, Estimated 9 (L) >60 mL/min    Comment: (NOTE) Calculated using the CKD-EPI Creatinine Equation (2021)    Anion gap 13 5 - 15    Comment: Performed at [facility], [place] [address], [place], [place] [address]  CBC WITH DIFFERENTIAL     Status: Abnormal   Collection Time: 07/17/2021  5:47 PM  Result Value Ref Range   WBC 7.2 4.0 - 10.5 K/uL   RBC 0.96 (L) 3.87 - 5.11 MIL/uL   Hemoglobin 3.0 (LL) 12.0 - 15.0 g/dL    Comment: REPEATED TO VERIFY THIS CRITICAL RESULT HAS VERIFIED AND BEEN CALLED TO M.QUICK, RN BY NATHAN  THOMPSON ON 10 17 2022 AT 1852, AND HAS BEEN READ BACK. CRITICAL RESULT VERIFIED    HCT 9.4 (L) 36.0 - 46.0 %   MCV 97.9 80.0 - 100.0 fL   MCH 31.3 26.0 - 34.0 pg   MCHC 31.9 30.0 - 36.0 g/dL   RDW 16.6 (H) 11.5 - 15.5 %   Platelets 128 (L) 150 - 400 K/uL   nRBC 1.0 (H) 0.0 - 0.2 %   Neutrophils Relative % 93 %   Neutro Abs 6.7 1.7 - 7.7 K/uL   Lymphocytes Relative 4 %   Lymphs Abs 0.3 (L) 0.7 - 4.0 K/uL   Monocytes Relative 2 %   Monocytes Absolute 0.2 0.1 - 1.0 K/uL   Eosinophils Relative 0 %   Eosinophils Absolute 0.0 0.0 - 0.5 K/uL   Basophils Relative 0 %   Basophils Absolute 0.0 0.0 - 0.1 K/uL   WBC Morphology INCREASED BANDS (>20% BANDS)     Comment: TOXIC GRANULATION VACUOLATED NEUTROPHILS    Immature Granulocytes 1 %   Abs Immature Granulocytes 0.07 0.00 - 0.07 K/uL   Schistocytes PRESENT    Tear Drop Cells PRESENT    Burr Cells PRESENT    Polychromasia PRESENT    Target Cells PRESENT     Comment: Performed at [facility], [place] [address], [place], [place] [address]  Protime-INR     Status: Abnormal   Collection Time: 07/14/2021  5:47 PM  Result Value Ref Range   Prothrombin Time 16.5 (H) 11.4 - 15.2 seconds   INR 1.3 (H) 0.8 - 1.2    Comment: (NOTE) INR goal varies based on device and disease states. Performed at [facility], [place] [address] [place], [place] [address]   APTT     Status: Abnormal   Collection Time: 06/30/2021  5:47 PM  Result Value Ref Range   aPTT 52 (H) 24 - 36 seconds    Comment:        IF BASELINE aPTT IS ELEVATED, SUGGEST PATIENT RISK ASSESSMENT BE USED TO DETERMINE APPROPRIATE ANTICOAGULANT THERAPY. Performed at [facility], [place] [address], [place], [place] [address]   Ethanol     Status: None   Collection Time: 07/02/2021  5:47 PM  Result Value Ref Range   Alcohol, Ethyl (B) <10 <10 mg/dL    Comment: (NOTE) Lowest detectable limit for serum alcohol is 10 mg/dL.  For medical  purposes only. Performed at [facility], [place] [address] [place], [place] [id number]   Salicylate level     Status: None   Collection Time: 07/02/2021  5:47 PM  Result Value Ref Range   Salicylate Lvl 14.3  7.0 - 30.0 mg/dL    Comment: Performed at [facility], [place] [address] [place], [place] [address]  TSH     Status: Abnormal   Collection Time: 07/15/2021  5:47 PM  Result Value Ref Range   TSH 140.295 (H) 0.350 - 4.500 uIU/mL    Comment: Performed by a 3rd Generation assay with a functional sensitivity of <=0.01 uIU/mL. Performed at [facility], [place] [address], [place], [place] [address]   I-Stat beta hCG blood, ED     Status: None   Collection Time: 07/29/2021  6:05 PM  Result Value Ref Range   I-stat hCG, quantitative <5.0 <5 mIU/mL   Comment 3            Comment:   GEST. AGE      CONC.  (mIU/mL)   <=1 WEEK        5 - 50     2 WEEKS       50 - 500     3 WEEKS       100 - 10,000     4 WEEKS     1,000 - 30,000        FEMALE AND NON-PREGNANT FEMALE:     LESS THAN 5 mIU/mL   Type and screen     Status: None (Preliminary result)   Collection Time: 07/05/2021  6:19 PM  Result Value Ref Range   ABO/RH(D) O POS    Antibody Screen POS    Sample Expiration      07/19/2021,2359 Performed at [facility], [place] [address], [place], [place] [address]    Unit Number [id number]    Blood Component Type RED CELLS,LR    Unit division 00    Status of Unit ISSUED    Unit tag comment EMERGENCY RELEASE    Transfusion Status OK TO TRANSFUSE    Crossmatch Result COMPATIBLE    Unit Number [id number]    Blood Component Type RED CELLS,LR    Unit division 00    Status of Unit ISSUED    Unit tag comment EMERGENCY RELEASE    Transfusion Status OK TO TRANSFUSE    Crossmatch Result COMPATIBLE    Unit Number [id number]    Blood Component Type RED CELLS,LR    Unit division 00    Status of Unit ISSUED    Unit tag  comment EMERGENCY RELEASE    Transfusion Status OK TO TRANSFUSE    Crossmatch Result COMPATIBLE   Blood gas, arterial (at [facility])     Status: Abnormal   Collection Time: 07/29/2021  6:32 PM  Result Value Ref Range   FIO2 100.00    Mode PRESSURE REGULATED VOLUME CONTROL    VT 450 mL   LHR 15 resp/min   Peep/cpap 5.0 cm H20   pH, Arterial 7.104 (LL) 7.350 - 7.450    Comment: CRITICAL RESULT CALLED TO, READ BACK BY AND VERIFIED WITH: MCIVER,M. RN '@1837'$  ON 07/29/2021 BY COHEN,K    pCO2 arterial 28.3 (L) 32.0 - 48.0 mmHg   pO2, Arterial 67.5 (L) 83.0 - 108.0 mmHg   Bicarbonate 8.5 (L) 20.0 - 28.0 mmol/L   Acid-base deficit 19.1 (H) 0.0 - 2.0 mmol/L   O2 Saturation 88.5 %   Patient temperature 98.6    Collection site LEFT RADIAL    Drawn by [id number]    Sample type ARTERIAL    Allens test (pass/fail) PASS PASS    Comment: Performed at [facility]  [place] [address], [place], [place] [address]  Lactic acid, plasma     Status: Abnormal   Collection Time: 07/11/2021  6:34 PM  Result Value Ref Range   Lactic Acid, Venous 2.4 (HH) 0.5 - 1.9 mmol/L    Comment: CRITICAL VALUE NOTED.  VALUE IS CONSISTENT WITH PREVIOUSLY REPORTED AND CALLED VALUE. Performed at [facility], [place] [address], [place], [place] [address]   Prepare RBC (crossmatch)     Status: None   Collection Time: 07/14/2021  6:34 PM  Result Value Ref Range   Order Confirmation      ORDER PROCESSED BY BLOOD BANK Performed at [facility], [place] [address] [place], [place] [address]   Prepare RBC (crossmatch)     Status: None   Collection Time: 07/22/2021  7:04 PM  Result Value Ref Range   Order Confirmation      ORDER PROCESSED BY BLOOD BANK Performed at [facility], [place] [address] [place], [place] [address]   POC occult blood, ED     Status: None   Collection Time: 06/30/2021  7:07 PM  Result Value Ref Range   Fecal Occult Bld NEGATIVE NEGATIVE    CT  HEAD WO CONTRAST (5MM)  Result Date: 07/08/2021 CLINICAL DATA:  Found down. EXAM: CT HEAD WITHOUT CONTRAST TECHNIQUE: Contiguous axial images were obtained from the base of the skull through the vertex without intravenous contrast. COMPARISON:  MRI brain 08/17/2012. FINDINGS: Brain: No evidence of acute infarction, hemorrhage, hydrocephalus, extra-axial collection or mass lesion/mass effect. Again seen is mild diffuse atrophy. There is mild periventricular white matter hypodensity which likely represents chronic small vessel ischemic change, similar to prior. Vascular: Atherosclerotic calcifications are present within the cavernous internal carotid arteries. Skull: Normal. Negative for fracture or focal lesion. Sinuses/Orbits: There secretions in the nasopharynx. There is opacification of bilateral mastoid air cells. There is an air-fluid level in the right sphenoid sinus. Other: Peripheral vascular calcifications are seen in the soft tissues. IMPRESSION: 1. No acute intracranial abnormality. 2. Mild diffuse atrophy and mild chronic small vessel ischemic change. 3. Right sphenoid sinusitis. 4. Bilateral mastoid effusions. Electronically Signed   By: Ronney Asters M.D.   On: 07/01/2021 18:35   DG Chest Port 1 View  Result Date: 07/06/2021 CLINICAL DATA:  Questionable sepsis. EXAM: PORTABLE CHEST 1 VIEW COMPARISON:  Chest x-ray 03/16/2021. FINDINGS: Endotracheal tube tip is 4 cm above the carina. Enteric tube is coiled in the stomach with distal tip near the gastric fundus. The heart is enlarged, unchanged. There are bilateral lower lung airspace opacities, left greater than right. There is no pleural effusion or pneumothorax. No acute fractures are seen. IMPRESSION: 1. Endotracheal tube tip 4 cm above carina. 2. Enteric tube is coiled in the stomach with distal tip near the fundus. Consider repositioning. 3. Bilateral lower lung airspace disease, left greater than right. 4. Stable cardiomegaly. Electronically  Signed   By: Ronney Asters M.D.   On: 07/17/2021 18:32    Assessment/Plan **Shock: presumed septic with hypothermia + severe anemia and being resuscitated and treated as such.  Lactate improving. Vanc + cefepime.  Cortisol and TTE ordered.   **AKI/CKD 5:  renal function really not off her advanced baseline but I certainly expect it to worsen after her presentation.  No absolute indication for dialysis at this point and with Hb 3 she needs to be transfused prior to initiation of any RRT.  I anticipate in the next 12-24h  it's likely she'll need RRT but ok to watch for now with medical management of her acidosis.  D/w family - she would accept dialysis if necessary.   **Acute hypoxic respiratory failure: requiring full vent support with high FiO2, being treated for PNA. Per pulmonary  **Anemia: severe.  Likely multifactorial but I suspect low epo related to CKD is playing a role.  Transfusion for now.    **Mixed acidosis: AG +nonAG + resp: elevated lactate, advanced CKD, resp failure all contributing.  Supplemental bicarb gtt for now and RRT as needed.  Will follow  -- page with clinical changes.   Justin Mend 07/08/2021, 9:01 PM

## 2021-07-16 NOTE — Progress Notes (Signed)
A consult was received from an ED physician for vancomycin & cefepime per pharmacy dosing.  The patient's profile has been reviewed for ht/wt/allergies/indication/available labs.   A one time order has been placed for vancomycin 1 gm, cefepime 2 gm & flagyl 500 mg.    Further antibiotics/pharmacy consults should be ordered by admitting physician if indicated.                       Thank you,  Eudelia Bunch, Pharm.D 06/30/2021 5:12 PM

## 2021-07-16 NOTE — ED Provider Notes (Signed)
INTUBATION Performed by: Mickie Hillier  Required items: required blood products, implants, devices, and special equipment available Patient identity confirmed: provided demographic data and hospital-assigned identification number Time out: Immediately prior to procedure a "time out" was called to verify the correct patient, procedure, equipment, support staff and site/side marked as required.  Indications: altered mental status   Intubation method: Glidescope Laryngoscopy   Preoxygenation: BVM  Sedatives: Etomidate '20mg'$  Paralytic: Rocuronium '100mg'$   Tube Size: 7.5 cuffed ETT  Post-procedure assessment: chest rise and ETCO2 monitor Breath sounds: equal and absent over the epigastrium Tube secured with: ETT holder Chest x-ray interpreted by radiologist and me.  Chest x-ray findings: endotracheal tube in appropriate position  Patient tolerated the procedure well with no immediate complications.     Mickie Hillier, PA-C 07/29/2021 Bosie Helper    Drenda Freeze, MD 07/10/2021 [phone]

## 2021-07-16 NOTE — Progress Notes (Addendum)
[name] Progress Note Patient Name: Kayla [name] DOB: 11-20-1960 MRN: [id number]   Date of Service  07/30/2021  HPI/Events of Note  60/F with hc of grave's disease, DM, hypertension, brought in due to altered mental status.  She was hypothermic, intubated, started on empiric antibiotics Vancomycin, cefepime and flagyl.  She was also profoundly anemic with hgb of 3.0.   CT head with no acute intracranial abnormality.  BP 138/45, HR 56, RR 20, 100%. Pt intubated.  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 Follow up Ft4, TSH, cortisol. Continue HCO3 gtt. Continue antibiotics.  Transfuse pRBCs as ordered. Protonix gtt.  SCDs for DVT prophylaxis.         Intervention Category Evaluation Type: New Patient Evaluation  Kayla [name] 07/18/2021, 9:42 PM  10:23 PM Received query from bedside RN regarding orders - continue LR, give synthroid IV and gtt.  Check POC glucose every 4 hours while NPO.   11:30 PM Notified that patient is COVID positive.  She was hypoxic on presentation and has infiltrates on imaging.   Plan> Start on remdesivir.  Pt is already on IV steroids.   12:58 AM Pt with limited IV access, now qith 3 peripheral lines but other medications are not compatible.   Plan> Change levothyroxine gtt to just 24mg IV bolus daily. Attempt to obtain another IV line.  RN to callback if running out of peripheral access for central line placement.   3:20 AM Notified of ABG results - 7.24/26.7/41.  Pt has been saturating 100%.  Plan> Increase FiO2 to 100%, PEEP to 8.   5:24 AM Confirmed with RT that blood gas reported earlier was venous blood gas. Repeat ABG done 7.248/24.1/315.  Plan> Decrease FiO2 down to 50%.

## 2021-07-16 NOTE — Addendum Note (Signed)
Addended by: Lalla Brothers T on: 07/22/2021 03:14 PM   Modules accepted: Level of Service

## 2021-07-16 NOTE — Progress Notes (Signed)
Pharmacy Antibiotic Note  Kayla [name] is a 60 y.o. female admitted on 07/05/2021 with sepsis.  Pharmacy has been consulted for vancomycin and cefepime dosing. Patient noted to have advanced CKD with baseline SCr steadily rising since 2016 and now consistently > 5, although still making urine per MD.  Today, 07/21/2021: Hypothermic WBC WNL SCr at baseline Vanc 1g and cefepime 2g given x 1 in ED  Plan: Vancomycin 500 mg IV x 1 to complete 1500 mg load Check random vancomycin level with AM labs on 10/19 and dose per levels while therapy continues Cefepime 1g IV q24 hr     Temp (24hrs), Avg:91.2 F (32.9 C), Min:90 F (32.2 C), Max:92.9 F (33.8 C)  Recent Labs  Lab 07/03/2021 1747 07/29/2021 1834  WBC 7.2  --   CREATININE 5.34*  --   LATICACIDVEN 4.3* 2.4*    CrCl cannot be calculated (Unknown ideal weight.).    Allergies  Allergen Reactions   Ace Inhibitors     Dizziness, nausea   Percocet [Oxycodone-Acetaminophen] Itching   Statins Itching   Latex Rash   Sulfamethoxazole Rash   Sulfites Rash    Antimicrobials this admission: 10/17 vancomycin >>  10/17 cefepime >>   Dose adjustments this admission: n/a  Microbiology results: 10/17 BCx: sent 10/17 UCx: sent   Thank you for allowing pharmacy to be a part of this patient's care.  Davonta Stroot A 07/19/2021 9:25 PM

## 2021-07-16 NOTE — Progress Notes (Signed)
Notified bedside nurse of need to draw lactic acid.  

## 2021-07-16 NOTE — ED Notes (Signed)
OG pulled back 10 cm per verbal from Darl Householder

## 2021-07-16 NOTE — ED Notes (Signed)
Critical hgb of 3.0 reported to Dr. Darl Householder.

## 2021-07-16 NOTE — ED Provider Notes (Signed)
Kayla [name] DEPT Provider Note   CSN: [id number] Arrival date & time: 07/15/2021  1643     History Chief Complaint  Patient presents with   Code Sepsis    Kayla [name] is a 60 y.o. female history of COPD, fibromyalgia, Graves' disease, diabetes here presenting with altered mental status.  Patient woke up this morning and appears to be altered.  Patient lives at home with daughter and per the daughter, she was at baseline yesterday.  Daughter eventually called EMS.  EMS noted that she was hypoxic to 59% and was placed on a nonrebreather.  Initially, it was unclear if patient is DNR or not.  I called daughter, she was intubated before and daughter did not have a DNR paperwork.  Per the chart, patient is still full code.  Patient is unable to give much history at all.  The history is provided by the EMS personnel.      Past Medical History:  Diagnosis Date   ANA POSITIVE 02/09/2010   Anal warts 11/09/2012   Anxiety    Arthritis    "my bones ache all the time" (06/27/2014)   CAP (community acquired pneumonia) 04/29/2019   Cellulitis 06/27/2014   medial proximal left thigh extending to the perineium   Chronic bronchitis ([place])    "get it changing of the seasons; sometimes q yr; sometimes not"    Complication of anesthesia    sats drop post op   COPD (chronic obstructive pulmonary disease) ([place])    Deafness in 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"   Diabetic peripheral neuropathy ([place]) since <2005   "hands and feet"   Dizziness    Multiple falls at home   Fibromyalgia    Graves' disease    "they nuked it twice"   Headache(784.0)    "monthly" (06/27/2014)   Heart murmur    as a child-never had echo to evaluate   Hx of Helicobacter infection 07/03/2019   Hyperlipidemia    Hypertension    Hypothyroidism    Insomnia 07/31/2006   Annotation: dx  1999 Qualifier: Diagnosis of  By: Sharlet Salina MD, [place]     Multiple falls 08/17/2012   Pain and swelling  of left ankle 08/07/2017   Proteinuria 07/31/2006   Annotation: 24 hour urine 256m / 24 hours   (11/03) Qualifier: Diagnosis of  By: CSharlet SalinaMD, [place]     PUD (peptic ulcer disease) 07/03/2019   Sickle cell trait ([place]    Trigger finger, left middle finger 12/15/2017   Trimalleolar fracture of left ankle 11/21/2014   Type 2 diabetes mellitus ([place]    Urinary urgency 02/26/2019   Vaginal odor 04/08/2021   Weakness 04/26/2019   Wears glasses     Patient Active Problem List   Diagnosis Date Noted   Counseling regarding goals of care 04/06/2021   Symptomatic anemia 03/17/2021   CKD (chronic kidney disease) stage 5, GFR less than 15 ml/min ([place] 03/16/2021   Iron deficiency anemia 07/03/2019   Anorexia    Hypercalcemia 11/28/2015   Elevated alkaline phosphatase level 06/27/2014   Unspecified vitamin D deficiency 04/20/2013   Essential hypertension, benign 02/15/2013   Healthcare maintenance 08/17/2012   Fibromyalgia syndrome 07/21/2012   Hearing loss 02/20/2011   HLD (hyperlipidemia) 03/30/2007   Hypothyroidism, postradioiodine therapy 10/13/2006   DM type 2, uncontrolled, with severe neuropathy 07/31/2006   SICKLE-CELL TRAIT 07/31/2006   Depression 07/31/2006   SHOULDER PAIN 07/31/2006    Past Surgical History:  Procedure Laterality Date   ANKLE FRACTURE SURGERY Left 2008   "didn't put hardware in"   [place]  04/28/2019   Procedure: BIOPSY;  Surgeon: Jerene Bears, MD;  Location: [place];  Service: Gastroenterology;;   [place]  1990's   ESOPHAGOGASTRODUODENOSCOPY (EGD) WITH PROPOFOL N/A 04/28/2019   Procedure: ESOPHAGOGASTRODUODENOSCOPY (EGD) WITH PROPOFOL;  Surgeon: Jerene Bears, MD;  Location: [facility];  Service: Gastroenterology;  Laterality: N/A;   FRACTURE SURGERY     INCISION AND DRAINAGE ABSCESS Left 07/01/2014   Procedure: INCISION AND DRAINAGE ABSCESS OF LEFT THIGH;  Surgeon: Georganna Skeans, MD;  Location: [facility];  Service: General;  Laterality: Left;   ORIF ANKLE FRACTURE Left 11/21/2014   Procedure: OPEN REDUCTION INTERNAL FIXATION (ORIF) TRI MALLEOLAR FRACTURE with retinacular repair;  Surgeon: Johnny Bridge, MD;  Location: [facility];  Service: Orthopedics;  Laterality: Left;     OB History     Gravida  3   Para  2   Term  2   Preterm  0   AB  1   Living  2      SAB  0   IAB  1   Ectopic  0   Multiple  0   Live Births              Family History  Problem Relation Age of Onset   Emphysema Mother    Cancer Mother        unknown   Hypertension Mother    Cancer Father        pancreatic    Diabetes Father    Hypertension Father    Down syndrome Brother    Cancer Maternal Aunt        cancer   Cancer Other        breast cancer aunt   Stroke Sister    Diabetes Sister    Colon cancer Neg Hx    Stomach cancer Neg Hx    Esophageal cancer Neg Hx    Inflammatory bowel disease Neg Hx    Liver disease Neg Hx    Pancreatic cancer Neg Hx    Rectal cancer Neg Hx     Social History   Tobacco Use   Smoking status: Former    Packs/day: 0.10    Years: 5.00    Pack years: 0.50    Types: Cigarettes    Quit date: 09/30/1990    Years since quitting: 30.8   Smokeless tobacco: Never   Tobacco comments:    1-2 everyday or so   Vaping Use   Vaping Use: Never used  Substance Use Topics   Alcohol use: No    Alcohol/week: 0.0 standard drinks   Drug use: No    Home Medications Prior to Admission medications   Medication Sig Start Date End Date Taking? Authorizing Provider  acetaminophen (TYLENOL) 325 MG tablet Take 2 tablets (650 mg total) by mouth every 6 (six) hours as needed for mild pain (or Fever >/= 101). Patient not taking: Reported on 05/22/2021 03/19/21   Riesa Pope, MD  amLODipine (NORVASC) 10 MG tablet Take 1 tablet (10 mg total) by mouth daily. 07/13/21   Virl Axe, MD  Blood Glucose Monitoring  Suppl (ACCU-CHEK AVIVA PLUS) W/DEVICE KIT 1 kit by Does not apply route as directed. 05/15/11   Bartholomew Crews, MD  cetaphil (CETAPHIL) lotion Apply  1 application topically as needed for dry skin. 04/05/21   Sanjuan Dame, MD  Darbepoetin Alfa (ARANESP) 60 MCG/0.3ML SOSY injection Inject 0.3 mLs (60 mcg total) into the skin every Saturday at 6 PM. Patient not taking: Reported on 05/22/2021 03/24/21   Riesa Pope, MD  hydrALAZINE (APRESOLINE) 25 MG tablet Take 1 tablet (25 mg total) by mouth every 8 (eight) hours. 07/13/21   Virl Axe, MD  HYDROmorphone (DILAUDID) 2 MG tablet Take 1 tablet (2 mg total) by mouth daily as needed for up to 7 days for severe pain. 07/13/21 07/23/21  Virl Axe, MD  Insulin Glargine (BASAGLAR KWIKPEN) 100 UNIT/ML SOPN Inject 0.2 mLs (20 Units total) into the skin daily. Patient not taking: Reported on 05/22/2021 05/28/19   Asencion Noble, MD  Lancet Devices [facility]) lancets Use as instructed Patient not taking: Reported on 05/22/2021 10/21/16   Dellia Nims, MD  metroNIDAZOLE (METROGEL) 0.75 % gel Apply 1 application topically 2 (two) times daily. 05/28/21   Masters, Katie, DO  sodium bicarbonate 650 MG tablet Take 2 tablets (1,300 mg total) by mouth 2 (two) times daily. 07/13/21   Virl Axe, MD  topiramate (TOPAMAX) 25 MG tablet Take 1 tablet (25 mg total) by mouth at bedtime. 07/13/21   Virl Axe, MD  venlafaxine XR (EFFEXOR XR) 37.5 MG 24 hr capsule Take 1 capsule (37.5 mg total) by mouth daily. 07/13/21   Virl Axe, MD    Allergies    Ace inhibitors, Percocet [oxycodone-acetaminophen], Statins, Latex, Sulfamethoxazole, and Sulfites  Review of Systems   Review of Systems  Unable to perform ROS: Mental status change  Psychiatric/Behavioral:  Positive for confusion.   All other systems reviewed and are negative.  Physical Exam Updated Vital Signs BP (!) 158/65   Pulse (!) 57   Temp (!) 91.3 F (32.9 C)    Resp 20   LMP 01/12/2014   SpO2 100%   Physical Exam Vitals and nursing note reviewed.  Constitutional:      Comments: Altered and confused and unable to give any history.  Patient is minimally responsive only to painful stimuli  HENT:     Head: Atraumatic.     Nose: Nose normal.     Mouth/Throat:     Mouth: Mucous membranes are dry.  Eyes:     Comments: Conjunctiva is pale   Cardiovascular:     Rate and Rhythm: Regular rhythm. Tachycardia present.     Pulses: Normal pulses.  Pulmonary:     Effort: Pulmonary effort is normal.  Abdominal:     General: Abdomen is flat.  Musculoskeletal:        General: Normal range of motion.     Cervical back: Normal range of motion.  Skin:    General: Skin is warm.     Capillary Refill: Capillary refill takes less than 2 seconds.  Neurological:     Comments: Responsive only to pain but moving all extremities    ED Results / Procedures / Treatments   Labs (all labs ordered are listed, but only abnormal results are displayed) Labs Reviewed  LACTIC ACID, PLASMA - Abnormal; Notable for the following components:      Result Value   Lactic Acid, Venous 4.3 (*)    All other components within normal limits  COMPREHENSIVE METABOLIC PANEL - Abnormal; Notable for the following components:   Chloride 116 (*)    CO2 9 (*)    Glucose, Bld 130 (*)    BUN 84 (*)  Creatinine, Ser 5.34 (*)    Calcium 7.0 (*)    Total Protein 5.7 (*)    Albumin 3.1 (*)    GFR, Estimated 9 (*)    All other components within normal limits  CBC WITH DIFFERENTIAL/PLATELET - Abnormal; Notable for the following components:   RBC 0.96 (*)    Hemoglobin 3.0 (*)    HCT 9.4 (*)    RDW 16.6 (*)    Platelets 128 (*)    nRBC 1.0 (*)    Lymphs Abs 0.3 (*)    All other components within normal limits  PROTIME-INR - Abnormal; Notable for the following components:   Prothrombin Time 16.5 (*)    INR 1.3 (*)    All other components within normal limits  APTT - Abnormal;  Notable for the following components:   aPTT 52 (*)    All other components within normal limits  URINALYSIS, ROUTINE W REFLEX MICROSCOPIC - Abnormal; Notable for the following components:   APPearance CLOUDY (*)    Hgb urine dipstick SMALL (*)    Protein, ur >=300 (*)    Leukocytes,Ua SMALL (*)    Bacteria, UA MANY (*)    Non Squamous Epithelial 0-5 (*)    All other components within normal limits  TSH - Abnormal; Notable for the following components:   TSH 140.295 (*)    All other components within normal limits  BLOOD GAS, ARTERIAL - Abnormal; Notable for the following components:   pH, Arterial 7.104 (*)    pCO2 arterial 28.3 (*)    pO2, Arterial 67.5 (*)    Bicarbonate 8.5 (*)    Acid-base deficit 19.1 (*)    All other components within normal limits  RESP PANEL BY RT-PCR (FLU A&B, COVID) ARPGX2  CULTURE, BLOOD (ROUTINE X 2)  CULTURE, BLOOD (ROUTINE X 2)  URINE CULTURE  ETHANOL  SALICYLATE LEVEL  LACTIC ACID, PLASMA  RAPID URINE DRUG SCREEN, HOSP PERFORMED  T4, FREE  T3  I-STAT BETA HCG BLOOD, ED ([facility], [facility], [facility] ONLY)  I-STAT CHEM 8, ED  POC OCCULT BLOOD, ED  TYPE AND SCREEN  PREPARE RBC (CROSSMATCH)  PREPARE RBC (CROSSMATCH)    EKG None  Radiology CT HEAD WO CONTRAST (5MM)  Result Date: 07/03/2021 CLINICAL DATA:  Found down. EXAM: CT HEAD WITHOUT CONTRAST TECHNIQUE: Contiguous axial images were obtained from the base of the skull through the vertex without intravenous contrast. COMPARISON:  MRI brain 08/17/2012. FINDINGS: Brain: No evidence of acute infarction, hemorrhage, hydrocephalus, extra-axial collection or mass lesion/mass effect. Again seen is mild diffuse atrophy. There is mild periventricular white matter hypodensity which likely represents chronic small vessel ischemic change, similar to prior. Vascular: Atherosclerotic calcifications are present within the cavernous internal carotid arteries. Skull: Normal. Negative for fracture or focal lesion.  Sinuses/Orbits: There secretions in the nasopharynx. There is opacification of bilateral mastoid air cells. There is an air-fluid level in the right sphenoid sinus. Other: Peripheral vascular calcifications are seen in the soft tissues. IMPRESSION: 1. No acute intracranial abnormality. 2. Mild diffuse atrophy and mild chronic small vessel ischemic change. 3. Right sphenoid sinusitis. 4. Bilateral mastoid effusions. Electronically Signed   By: Ronney Asters M.D.   On: 07/15/2021 18:35   DG Chest Port 1 View  Result Date: 07/11/2021 CLINICAL DATA:  Questionable sepsis. EXAM: PORTABLE CHEST 1 VIEW COMPARISON:  Chest x-ray 03/16/2021. FINDINGS: Endotracheal tube tip is 4 cm above the carina. Enteric tube is coiled in the stomach with distal tip near the  gastric fundus. The heart is enlarged, unchanged. There are bilateral lower lung airspace opacities, left greater than right. There is no pleural effusion or pneumothorax. No acute fractures are seen. IMPRESSION: 1. Endotracheal tube tip 4 cm above carina. 2. Enteric tube is coiled in the stomach with distal tip near the fundus. Consider repositioning. 3. Bilateral lower lung airspace disease, left greater than right. 4. Stable cardiomegaly. Electronically Signed   By: Ronney Asters M.D.   On: 07/15/2021 18:32    Procedures Procedures   CRITICAL CARE Performed by: Wandra Arthurs   Total critical care time:40 minutes  Critical care time was exclusive of separately billable procedures and treating other patients.  Critical care was necessary to treat or prevent imminent or life-threatening deterioration.  Critical care was time spent personally by me on the following activities: development of treatment plan with patient and/or surrogate as well as nursing, discussions with consultants, evaluation of patient's response to treatment, examination of patient, obtaining history from patient or surrogate, ordering and performing treatments and interventions,  ordering and review of laboratory studies, ordering and review of radiographic studies, pulse oximetry and re-evaluation of patient's condition.   Medications Ordered in ED Medications  lactated ringers infusion (has no administration in time range)  lactated ringers bolus 1,000 mL (0 mLs Intravenous Stopped 07/04/2021 1830)    And  lactated ringers bolus 500 mL (0 mLs Intravenous Stopped 07/01/2021 1929)    And  lactated ringers bolus 250 mL (has no administration in time range)  propofol (DIPRIVAN) 1000 MG/100ML infusion (15 mcg/kg/min Intravenous New Bag/Given 07/30/2021 1929)  0.9 %  sodium chloride infusion ( Intravenous Canceled Entry  1930)  0.9 %  sodium chloride infusion (Manually program via Guardrails IV Fluids) ( Intravenous Canceled Entry 07/23/2021 1931)  sodium bicarbonate 150 mEq in dextrose 5 % 1,150 mL infusion ( Intravenous Infusion Verify 07/02/2021 1928)  pantoprozole (PROTONIX) 80 mg /NS 100 mL infusion (8 mg/hr Intravenous New Bag/Given 07/21/2021 2002)  0.9 %  sodium chloride infusion ( Intravenous Canceled Entry 07/27/2021 1931)  hydrocortisone sodium succinate (SOLU-CORTEF) 100 MG injection 100 mg (has no administration in time range)  levothyroxine (SYNTHROID, LEVOTHROID) 200 mcg in sodium chloride 0.9 % 500 mL (0.4 mcg/mL) infusion (has no administration in time range)  levothyroxine (SYNTHROID, LEVOTHROID) injection 200 mcg (has no administration in time range)  ceFEPIme (MAXIPIME) 2 g in sodium chloride 0.9 % 100 mL IVPB (0 g Intravenous Stopped 07/07/2021 1832)  metroNIDAZOLE (FLAGYL) IVPB 500 mg (0 mg Intravenous Stopped 07/15/2021 1903)  vancomycin (VANCOCIN) IVPB 1000 mg/200 mL premix (0 mg Intravenous Stopped 07/28/2021 1855)  etomidate (AMIDATE) injection 20 mg (20 mg Intravenous Given 07/27/2021 1715)  rocuronium (ZEMURON) injection 100 mg (100 mg Intravenous Given 07/07/2021 1716)  sodium bicarbonate 1 mEq/mL injection (  Given  1711)  pantoprazole (PROTONIX)  80 mg /NS 100 mL IVPB (80 mg Intravenous New Bag/Given 07/12/2021 1928)  sodium bicarbonate injection 50 mEq (50 mEq Intravenous Given 07/06/2021 1915)    ED Course  I have reviewed the triage vital signs and the nursing notes.  Pertinent labs & imaging results that were available during my care of the patient were reviewed by me and considered in my medical decision making (see chart for details).    MDM Rules/Calculators/A&P                           Nadia Viar is a 60 y.o. female here presenting with  altered mental status.  Patient is very altered and hypoxic.  Patient supposedly has a saturation of 59%.  Patient unable to give much history and has a GCS of 3. Concern for possible head bleed versus aspiration pneumonia versus GI bleed versus overdose versus renal failure versus sepsis causing her altered mental status.  We will do a code sepsis and the PA intubated patient under my supervision. CBC, CMP, lactate, cultures, CXR, CT head and TSH ordered. Will give 30 cc/kg bolus and broad spectrum abx   7 pm  Patient's hemoglobin came out at 3.  Received 2 units emergent blood and order 3 more.  Also ordered Protonix IV.  However patient's guaiac is negative.  Patient also has multisystem failure with pH of 7.1 and bicarb of 9 and her creatinine is baseline at 5.5. Her potassium is normal.  I consulted ICU regarding the patient.  I also consulted Dr. Johnney Ou from nephrology to see patient.  Patient does not have dialysis catheter right now.  Her CT head did not show any bleeding.   8:09 PM TSH is 140.  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  I consulted pharmacy who will help me dose IV Synthroid bolus and also daily infusion and also will need steroids and cortisol level in the morning and repeat TSH. 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

## 2021-07-16 NOTE — Progress Notes (Addendum)
MEDICATION RELATED CONSULT NOTE   Pharmacy Consult for IV levothyroxine Indication: Myxedema  Allergies  Allergen Reactions   Ace Inhibitors     Dizziness, nausea   Percocet [Oxycodone-Acetaminophen] Itching   Statins Itching   Latex Rash   Sulfamethoxazole Rash   Sulfites Rash    Patient Measurements:   Adjusted Body Weight: 56.4 kg  Vital Signs: Temp: 92.3 F (33.5 C) (10/17 2045) Temp Source: Rectal (10/17 1915) BP: 138/57 (10/17 2045) Pulse Rate: 54 (10/17 2045) Intake/Output from previous day: No intake/output data recorded. Intake/Output from this shift: Total I/O In: 652.4 [I.V.:37.4; Blood:315; IV Piggyback:300] Out: -   Labs: Recent Labs    07/24/2021 1747  WBC 7.2  HGB 3.0*  HCT 9.4*  PLT 128*  APTT 52*  CREATININE 5.34*  ALBUMIN 3.1*  PROT 5.7*  AST 29  ALT 11  ALKPHOS 108  BILITOT 0.8   CrCl cannot be calculated (Unknown ideal weight.).   Microbiology: No results found for this or any previous visit (from the past 720 hour(s)).  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"   Diabetic peripheral neuropathy ([place]) since <2005   "hands and feet"   Dizziness    Multiple falls at home   Fibromyalgia    Graves' disease    "they nuked it twice"   Headache(784.0)    "monthly" (06/27/2014)   Heart murmur    as a child-never had echo to evaluate   Hx of Helicobacter infection 07/03/2019   Hyperlipidemia    Hypertension     Hypothyroidism    Insomnia 07/31/2006   Annotation: dx  1999 Qualifier: Diagnosis of  By: Sharlet Salina MD, [place]     Multiple falls 08/17/2012   Pain and swelling of left ankle 08/07/2017   Proteinuria 07/31/2006   Annotation: 24 hour urine '204mg'$  / 24 hours   (11/03) Qualifier: Diagnosis of  By: Sharlet Salina MD, [place]     PUD (peptic ulcer disease) 07/03/2019   Sickle cell trait ([place])    Trigger finger, left middle finger 12/15/2017   Trimalleolar fracture of left ankle 11/21/2014   Type 2 diabetes mellitus ([place])    Urinary urgency 02/26/2019   Vaginal odor 04/08/2021   Weakness 04/26/2019   Wears glasses     Medications:  (Not in a hospital admission)  Scheduled:   sodium chloride   Intravenous Once   hydrocortisone sod succinate (SOLU-CORTEF) inj  100 mg Intravenous Q8H   levothyroxine  200 mcg Intravenous Once   Infusions:   sodium chloride     sodium chloride     [START ON 07/17/2021] ceFEPime (MAXIPIME) IV     fentaNYL infusion INTRAVENOUS     lactated ringers     lactated ringers     levothyroxine (SYNTHROID) IV Infusion     [START ON 07/17/2021] metronidazole     pantoprazole 8 mg/hr (07/08/2021 2002)   propofol (DIPRIVAN)  infusion     sodium bicarbonate 150 mEq in D5W infusion 100 mL/hr at 07/26/2021 1928   vancomycin     PRN: docusate  Assessment: 78 yoF with PMH CKD5, Graves' disease s/p thyroid radio-ablation x 2, COPD, HTN, HLD, admitted 10/17 with AMS. Found to be hypoxemic, hypothermic, and hypotensive. Intubated in ED. TSH level returned at ~140 and Pharmacy consulted to dose IV Synthroid for myxedema  Goal of Therapy:  Normalize TSH, T4, T3  Plan:  IVT4 200 mcg bolus x 1; no noted CV disease, but does have numerous risk factors for undiagnosed dz (longstanding DM, CKD, HTN, HLD) IVT4 3.6 mcg/hr (~1.6 mcg/kg/day) until able to resume PO Synthroid Solu-Cortef 100 mg IV q8 hr Check AM random cortisol Thyroid panel every 24-48 hr   Kayla [name] A 07/28/2021,9:09  PM

## 2021-07-17 ENCOUNTER — Inpatient Hospital Stay (HOSPITAL_COMMUNITY): Payer: Self-pay

## 2021-07-17 ENCOUNTER — Encounter (HOSPITAL_COMMUNITY): Payer: Self-pay | Admitting: Pulmonary Disease

## 2021-07-17 DIAGNOSIS — N19 Unspecified kidney failure: Secondary | ICD-10-CM

## 2021-07-17 DIAGNOSIS — L8990 Pressure ulcer of unspecified site, unspecified stage: Secondary | ICD-10-CM | POA: Insufficient documentation

## 2021-07-17 DIAGNOSIS — I5030 Unspecified diastolic (congestive) heart failure: Secondary | ICD-10-CM

## 2021-07-17 DIAGNOSIS — J9601 Acute respiratory failure with hypoxia: Secondary | ICD-10-CM

## 2021-07-17 LAB — RENAL FUNCTION PANEL
Albumin: 2.7 g/dL — ABNORMAL LOW (ref 3.5–5.0)
Anion gap: 14 (ref 5–15)
BUN: 85 mg/dL — ABNORMAL HIGH (ref 6–20)
CO2: 14 mmol/L — ABNORMAL LOW (ref 22–32)
Calcium: 6.8 mg/dL — ABNORMAL LOW (ref 8.9–10.3)
Chloride: 110 mmol/L (ref 98–111)
Creatinine, Ser: 5.46 mg/dL — ABNORMAL HIGH (ref 0.44–1.00)
GFR, Estimated: 8 mL/min — ABNORMAL LOW (ref >60)
Glucose, Bld: 109 mg/dL — ABNORMAL HIGH (ref 70–99)
Phosphorus: 7.5 mg/dL — ABNORMAL HIGH (ref 2.5–4.6)
Potassium: 4.3 mmol/L (ref 3.5–5.1)
Sodium: 138 mmol/L (ref 135–145)

## 2021-07-17 LAB — BLOOD GAS, ARTERIAL
Acid-base deficit: 12.7 mmol/L — ABNORMAL HIGH (ref 0.0–2.0)
Acid-base deficit: 14 mmol/L — ABNORMAL HIGH (ref 0.0–2.0)
Acid-base deficit: 14.5 mmol/L — ABNORMAL HIGH (ref 0.0–2.0)
Bicarbonate: 10.9 mmol/L — ABNORMAL LOW (ref 20.0–28.0)
Bicarbonate: 11.3 mmol/L — ABNORMAL LOW (ref 20.0–28.0)
Bicarbonate: 12.8 mmol/L — ABNORMAL LOW (ref 20.0–28.0)
Drawn by: 560031
FIO2: 100
FIO2: 40
FIO2: 80
MECHVT: 500 mL
MECHVT: 400 mL
O2 Saturation: 76.3 %
O2 Saturation: 92.5 %
O2 Saturation: 99.9 %
PEEP: 8 cmH2O
PEEP: 8 cmH2O
Patient temperature: 100
Patient temperature: 96.8
Patient temperature: 98.6
RATE: 20 resp/min
RATE: 20 resp/min
pCO2 arterial: 24.1 mmHg — ABNORMAL LOW (ref 32.0–48.0)
pCO2 arterial: 26.7 mmHg — ABNORMAL LOW (ref 32.0–48.0)
pCO2 arterial: 29 mmHg — ABNORMAL LOW (ref 32.0–48.0)
pH, Arterial: 7.248 — ABNORMAL LOW (ref 7.350–7.450)
pH, Arterial: 7.266 — ABNORMAL LOW (ref 7.350–7.450)
pH, Arterial: 7.284 — ABNORMAL LOW (ref 7.350–7.450)
pO2, Arterial: 315 mmHg — ABNORMAL HIGH (ref 83.0–108.0)
pO2, Arterial: 41 mmHg — ABNORMAL LOW (ref 83.0–108.0)
pO2, Arterial: 65.6 mmHg — ABNORMAL LOW (ref 83.0–108.0)

## 2021-07-17 LAB — CBC
HCT: 37 % (ref 36.0–46.0)
Hemoglobin: 12.6 g/dL (ref 12.0–15.0)
MCH: 30.6 pg (ref 26.0–34.0)
MCHC: 34.1 g/dL (ref 30.0–36.0)
MCV: 89.8 fL (ref 80.0–100.0)
Platelets: 79 10*3/uL — ABNORMAL LOW (ref 150–400)
RBC: 4.12 MIL/uL (ref 3.87–5.11)
RDW: 15.7 % — ABNORMAL HIGH (ref 11.5–15.5)
WBC: 10.1 10*3/uL (ref 4.0–10.5)
nRBC: 0.9 % — ABNORMAL HIGH (ref 0.0–0.2)

## 2021-07-17 LAB — ECHOCARDIOGRAM COMPLETE
Area-P 1/2: 3.53 cm2
Height: 62 in
S' Lateral: 2.4 cm
Weight: 2038.81 oz

## 2021-07-17 LAB — GLUCOSE, CAPILLARY
Glucose-Capillary: 108 mg/dL — ABNORMAL HIGH (ref 70–99)
Glucose-Capillary: 108 mg/dL — ABNORMAL HIGH (ref 70–99)
Glucose-Capillary: 116 mg/dL — ABNORMAL HIGH (ref 70–99)
Glucose-Capillary: 129 mg/dL — ABNORMAL HIGH (ref 70–99)
Glucose-Capillary: 146 mg/dL — ABNORMAL HIGH (ref 70–99)
Glucose-Capillary: 152 mg/dL — ABNORMAL HIGH (ref 70–99)

## 2021-07-17 LAB — RAPID URINE DRUG SCREEN, HOSP PERFORMED
Amphetamines: NOT DETECTED
Barbiturates: NOT DETECTED
Benzodiazepines: NOT DETECTED
Cocaine: NOT DETECTED
Opiates: POSITIVE — AB
Tetrahydrocannabinol: POSITIVE — AB

## 2021-07-17 LAB — PROCALCITONIN: Procalcitonin: 27.95 ng/mL

## 2021-07-17 LAB — FERRITIN: Ferritin: 611 ng/mL — ABNORMAL HIGH (ref 11–307)

## 2021-07-17 LAB — D-DIMER, QUANTITATIVE: D-Dimer, Quant: 3.48 ug/mL-FEU — ABNORMAL HIGH (ref 0.00–0.50)

## 2021-07-17 LAB — FIBRINOGEN: Fibrinogen: 354 mg/dL (ref 210–475)

## 2021-07-17 LAB — CORTISOL: Cortisol, Plasma: >100 ug/dL

## 2021-07-17 LAB — PROTIME-INR
INR: 1.2 (ref 0.8–1.2)
Prothrombin Time: 15.3 seconds — ABNORMAL HIGH (ref 11.4–15.2)

## 2021-07-17 LAB — T4, FREE: Free T4: <0.25 ng/dL — ABNORMAL LOW (ref 0.61–1.12)

## 2021-07-17 LAB — T3: T3, Total: <20 ng/dL — ABNORMAL LOW (ref 71–180)

## 2021-07-17 LAB — C-REACTIVE PROTEIN: CRP: 19.4 mg/dL — ABNORMAL HIGH (ref <1.0)

## 2021-07-17 LAB — STREP PNEUMONIAE URINARY ANTIGEN: Strep Pneumo Urinary Antigen: NEGATIVE

## 2021-07-17 MED ORDER — POLYETHYLENE GLYCOL 3350 17 G PO PACK
17.0000 g | PACK | Freq: Every day | ORAL | Status: DC
  Administered 2021-07-17 – 2021-07-19 (×3): 17 g
  Filled 2021-07-17 (×3): qty 1

## 2021-07-17 MED ORDER — FENTANYL BOLUS VIA INFUSION
50.0000 ug | INTRAVENOUS | Status: DC | PRN
  Administered 2021-07-17 (×2): 100 ug via INTRAVENOUS
  Administered 2021-07-18: 50 ug via INTRAVENOUS
  Administered 2021-07-18 – 2021-07-19 (×2): 100 ug via INTRAVENOUS
  Administered 2021-07-19: 50 ug via INTRAVENOUS
  Administered 2021-07-19: 75 ug via INTRAVENOUS
  Administered 2021-07-19: 50 ug via INTRAVENOUS
  Administered 2021-07-20: 75 ug via INTRAVENOUS
  Administered 2021-07-20: 50 ug via INTRAVENOUS
  Administered 2021-07-20: 75 ug via INTRAVENOUS
  Administered 2021-07-20: 100 ug via INTRAVENOUS
  Administered 2021-07-20: 75 ug via INTRAVENOUS
  Administered 2021-07-20 (×2): 50 ug via INTRAVENOUS
  Administered 2021-07-20 – 2021-07-21 (×2): 75 ug via INTRAVENOUS
  Administered 2021-07-21 (×2): 100 ug via INTRAVENOUS
  Administered 2021-07-21 (×3): 75 ug via INTRAVENOUS
  Administered 2021-07-21: 100 ug via INTRAVENOUS
  Administered 2021-07-21 – 2021-07-22 (×6): 75 ug via INTRAVENOUS
  Administered 2021-07-22: 100 ug via INTRAVENOUS
  Administered 2021-07-22 (×2): 75 ug via INTRAVENOUS
  Administered 2021-07-22: 100 ug via INTRAVENOUS
  Administered 2021-07-22: 75 ug via INTRAVENOUS
  Filled 2021-07-17: qty 100

## 2021-07-17 MED ORDER — METHYLPREDNISOLONE SODIUM SUCC 125 MG IJ SOLR
57.0000 mg | Freq: Every day | INTRAMUSCULAR | Status: DC
  Administered 2021-07-17 – 2021-07-25 (×9): 57 mg via INTRAVENOUS
  Filled 2021-07-17 (×9): qty 2

## 2021-07-17 MED ORDER — DEXTROSE 5 % IV SOLN
250.0000 mg | INTRAVENOUS | Status: DC
  Administered 2021-07-17 – 2021-07-19 (×3): 250 mg via INTRAVENOUS
  Filled 2021-07-17 (×5): qty 250

## 2021-07-17 MED ORDER — PANTOPRAZOLE SODIUM 40 MG IV SOLR
40.0000 mg | INTRAVENOUS | Status: DC
  Administered 2021-07-18 – 2021-07-25 (×8): 40 mg via INTRAVENOUS
  Filled 2021-07-17 (×8): qty 40

## 2021-07-17 MED ORDER — HEPARIN SODIUM (PORCINE) 5000 UNIT/ML IJ SOLN
5000.0000 [IU] | Freq: Three times a day (TID) | INTRAMUSCULAR | Status: DC
  Administered 2021-07-17 – 2021-07-21 (×11): 5000 [IU] via SUBCUTANEOUS
  Filled 2021-07-17 (×10): qty 1

## 2021-07-17 MED ORDER — DOCUSATE SODIUM 50 MG/5ML PO LIQD
100.0000 mg | Freq: Two times a day (BID) | ORAL | Status: DC
  Administered 2021-07-17 – 2021-07-19 (×6): 100 mg
  Filled 2021-07-17 (×6): qty 10

## 2021-07-17 MED ORDER — FENTANYL CITRATE (PF) 100 MCG/2ML IJ SOLN: 25.0000 ug | INTRAMUSCULAR | Status: DC | PRN

## 2021-07-17 MED ORDER — ARTIFICIAL TEARS OPHTHALMIC OINT
TOPICAL_OINTMENT | OPHTHALMIC | Status: DC | PRN
  Filled 2021-07-17: qty 3.5

## 2021-07-17 MED ORDER — FENTANYL CITRATE (PF) 100 MCG/2ML IJ SOLN: 50.0000 ug | Freq: Once | INTRAMUSCULAR | Status: DC

## 2021-07-17 MED ORDER — LEVOTHYROXINE SODIUM 100 MCG/5ML IV SOLN
50.0000 ug | Freq: Every day | INTRAVENOUS | Status: DC
  Administered 2021-07-17 – 2021-07-25 (×8): 50 ug via INTRAVENOUS
  Filled 2021-07-17 (×9): qty 5

## 2021-07-17 MED ORDER — SODIUM CHLORIDE 0.9 % IV SOLN
2.0000 g | INTRAVENOUS | Status: AC
  Administered 2021-07-17 – 2021-07-23 (×7): 2 g via INTRAVENOUS
  Filled 2021-07-17 (×7): qty 20

## 2021-07-17 NOTE — Progress Notes (Signed)
  Interdisciplinary Goals of Care Family Meeting   Date carried out:: 07/17/2021  Location of the meeting: Bedside  Member's involved: Nurse Practitioner, Bedside Registered Nurse, and Family Member or next of kin  Durable Power of Attorney or acting medical decision maker: oldest child, Derryl Harbor  [phone]  Discussion: We discussed goals of care for Kayla [name] .  Nephrology had discussion earlier this afternoon concerning dialysis, which is not recommended given her multiple and serious comorbidities, poor compliance, ongoing chronic pain, and debility.  Daughter verbalizes that her health has continued to progressively decline, now in wheelchair unable to walk, has poor vision and hearing, and stresses the importance of keeping her comfortable.  She wants to continue current care, but agrees that if she were to decompensate despite what we are doing, CPR would be traumatic and of little benefit.  Plan is to continue supportive care, but no dialysis or CPR.    Code status: Limited- NO CPR  Disposition: Continue current acute care  Time spent for the meeting: 25 mins  Daughter also requesting TOC consult to help establish POA.  Consult ordered.   Kennieth Rad, ACNP [facility] 07/17/2021, 4:53 PM  See Amion for pager If no response to pager, please call PCCM consult pager After 7:00 pm call [place]

## 2021-07-17 NOTE — Consult Note (Signed)
[facility] Nurse Consult Note: Reason for Consult: Consult requested for right heel and sacrum.  Performed remotely after review of progress notes and nursing wound care flow sheet. Pt is noted to have a deep tissue pressure injury to the right heel and a Stage 2 pressure injury to the sacrum.  These can be treated independently by the bedside nurses using the Skin care order set.  Pressure Injury POA: Yes Dressing procedure/placement/frequency: Topical treatment orders provided for bedside nurses to perform to protect from further injury: Foam dressing to sacrum, change Q 3 days or PRN soiling. Prevalon beet to right heel to reduce pressure. Please re-consult if further assistance is needed.  Thank-you,  Julien Girt MSN, [place], [place], [place], [place]

## 2021-07-17 NOTE — Progress Notes (Signed)
Initial Nutrition Assessment  INTERVENTION:   TF recommendations: -Vital AF 1.2 @ 50 ml/hr via OGT -Provides 1440 kcals, 90g protein and 973 ml H2O  NUTRITION DIAGNOSIS:   Increased nutrient needs related to acute illness as evidenced by estimated needs  GOAL:   Patient will meet greater than or equal to 90% of their needs  MONITOR:   Diet advancement, Labs, Weight trends, I & O's, Skin (GOC)  REASON FOR ASSESSMENT:   Ventilator    ASSESSMENT:   60 y.o. female history of COPD, fibromyalgia, Graves' disease, diabetes here presenting with altered mental status.  Patient currently COVID+. Intubated 10/17. Per chart review, was unable to be weaned this AM d/t increased agitation. Pt with CKD stage 5. Per nephrology note, HD not recommended.   Per chart review, pt was able to feed herself PTA. Wheelchair bound.  Will leave TF recommendations above if pt to remain intubated.  Patient is currently intubated on ventilator support MV: 7.7 L/min Temp (24hrs), Avg:93.6 F (34.2 C), Min:90 F (32.2 C), Max:98.2 F (36.8 C)  Propofol: 3.47 ml/hr -provides 91 fat kcals  Medications: Colace, Miralax, Ca gluconate, Fentanyl, sodium bicarb in D5 infusion   Labs reviewed:  CBGs: 108- 152 Phos (7.5) UDS+ opiates, THC  NUTRITION - FOCUSED PHYSICAL EXAM:  Provider in room  Diet Order:   Diet Order             Diet NPO time specified  Diet effective now                   EDUCATION NEEDS:   Not appropriate for education at this time  Skin:  Skin Assessment: Skin Integrity Issues: Skin Integrity Issues:: Stage II, DTI DTI: right heel Stage II: medial coccyx  Last BM:  10/17  Height:   Ht Readings from Last 1 Encounters:  07/17/21 '5\' 2"'$  (1.575 m)    Weight:   Wt Readings from Last 1 Encounters:  07/15/2021 57.8 kg    BMI:  Body mass index is 23.31 kg/m.  Estimated Nutritional Needs:   Kcal:  1238  Protein:  85-95g  Fluid:   1.3L/day   Clayton Bibles, MS, RD, LDN Inpatient Clinical Dietitian Contact information available via Amion

## 2021-07-17 NOTE — Consult Note (Signed)
Renal Service Consult Note [facility]  Kayla [name] 07/17/2021 Sol Blazing, MD Requesting Physician: Dr. Lamonte Sakai  Reason for Consult: Renal failure HPI: The patient is a 60 y.o. year-old w/ hx of COPD, DM2, Graves disease, HL, HTN, who presented on 10/17 after being found down at home by her daughter. In ED pt was obtunded, agitated and edematous and hypothermic. Pt was intubated and rec'd IV vanc/ flagyl and cefepime, 1 L LR and IV protonix gtt. Labs showed CO2 9, Hb 3,  creat 5.34, BUN 84  K 4.6 .  WBC 7K.  Pt was admitted to ICU, got 5u prbc's overnight.  Hb 12 today.  CO2 up to 14 on bicarb gtt, creat 5.4 unchanged.  AG 14, alb 2.7, LA 2.4.  UA showed >300 prot, many bact and 20-50 wbc, 0-5 rbc.  Asked to see for renal failure.   Pt seen in ICU, on vent and not responding. Hx per daughter. Pt lives w/ her at home, pt is bed to [facility] bound for some time.  Normally she is able to feed herself at home.   Pt has missed a number of CKD office visits.  Pt the dtr the patient has chronic pain, which is a big issue, and missed office visits due to lack of insurance and pain issues.    ROS - n/a   Past Medical History  Past Medical History:  Diagnosis Date   ANA POSITIVE 02/09/2010   Anal warts 11/09/2012   Anxiety    Arthritis    "my bones ache all the time" (06/27/2014)   CAP (community acquired pneumonia) 04/29/2019   Cellulitis 06/27/2014   medial proximal left thigh extending to the perineium   Chronic bronchitis ([place])    "get it changing of the seasons; sometimes q yr; sometimes not"    Complication of anesthesia    sats drop post op   COPD (chronic obstructive pulmonary disease) ([place])    Deafness in 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"   Diabetic peripheral neuropathy ([place]) since <2005   "hands and feet"   Dizziness    Multiple falls at home   Fibromyalgia    Graves' disease    "they nuked it twice"   Headache(784.0)    "monthly" (06/27/2014)   Heart  murmur    as a child-never had echo to evaluate   Hx of Helicobacter infection 07/03/2019   Hyperlipidemia    Hypertension    Hypothyroidism    Insomnia 07/31/2006   Annotation: dx  1999 Qualifier: Diagnosis of  By: Sharlet Salina MD, [place]     Multiple falls 08/17/2012   Pain and swelling of left ankle 08/07/2017   Proteinuria 07/31/2006   Annotation: 24 hour urine 222m / 24 hours   (11/03) Qualifier: Diagnosis of  By: CSharlet SalinaMD, [place]     PUD (peptic ulcer disease) 07/03/2019   [place]ickle cell trait [place]    Trigger finger, left middle finger 12/15/2017   Trimalleolar fracture of left ankle 11/21/2014   Type 2 diabetes mellitus ([place]    Urinary urgency 02/26/2019   Vaginal odor 04/08/2021   Weakness 04/26/2019   Wears glasses    Past Surgical History  Past Surgical History:  Procedure Laterality Date   ANKLE FRACTURE SURGERY Left 2008   "didn't put hardware in"   [place] 04/28/2019   Procedure: BIOPSY;  Surgeon: PJerene Bears MD;  [place]  Service: Gastroenterology;;  [place] CURETTAGE OF UTERUS  1990's   ESOPHAGOGASTRODUODENOSCOPY (EGD) WITH PROPOFOL N/A 04/28/2019   Procedure: ESOPHAGOGASTRODUODENOSCOPY (EGD) WITH PROPOFOL;  Surgeon: Jerene Bears, MD;  [facility] END[facility]COPY;  Service: Gastroenterology;  Laterality: N/A;   FRACTURE SURGERY     INCISION AND DRAINAGE ABSCESS Left 07/01/2014   Procedure: INCISION AND DRAINAGE ABSCESS OF LEFT THIGH;  Surgeon: Georganna Skeans, MD;  Location: [place];  Service: General;  Laterality: Left;   ORIF ANKLE FRACTURE Left 11/21/2014   Procedure: OPEN REDUCTION INTERNAL FIXATION (ORIF) TRI MALLEOLAR FRACTURE with retinacular repair;  Surgeon: Johnny Bridge, MD;  Location: [place];  Service: Orthopedics;  Laterality: Left;   Family History  Family History  Problem Relation Age of Onset   Emphysema Mother    Cancer Mother        unknown   Hypertension Mother     Cancer Father        pancreatic    Diabetes Father    Hypertension Father    Down syndrome Brother    Cancer Maternal Aunt        cancer   Cancer Other        breast cancer aunt   Stroke Sister    Diabetes Sister    Colon cancer Neg Hx    Stomach cancer Neg Hx    Esophageal cancer Neg Hx    Inflammatory bowel disease Neg Hx    Liver disease Neg Hx    Pancreatic cancer Neg Hx    Rectal cancer Neg Hx    Social History  reports that she quit smoking about 30 years ago. Her smoking use included cigarettes. She has a 0.50 pack-year smoking history. She has never used smokeless tobacco. She reports that she does not drink alcohol and does not use drugs. Allergies  Allergies  Allergen Reactions   Ace Inhibitors     Dizziness, nausea   Percocet [Oxycodone-Acetaminophen] Itching   Statins Itching   Latex Rash   Sulfamethoxazole Rash   Sulfites Rash   Home medications Prior to Admission medications   Medication Sig Start Date End Date Taking? Authorizing Provider  acetaminophen (TYLENOL) 325 MG tablet Take 2 tablets (650 mg total) by mouth every 6 (six) hours as needed for mild pain (or Fever >/= 101). 03/19/21  Yes Katsadouros, Vasilios, MD  amLODipine (NORVASC) 10 MG tablet Take 1 tablet (10 mg total) by mouth daily. 07/13/21  Yes Virl Axe, MD  Aspirin-Salicylamide-Caffeine (ARTHRITIS STRENGTH BC POWDER PO) Take 1 application by mouth daily as needed (breakthrough pain).   Yes [provider]  cetaphil (CETAPHIL) lotion Apply 1 application topically as needed for dry skin. 04/05/21  Yes Sanjuan Dame, MD  Darbepoetin Alfa (ARANESP) 60 MCG/0.3ML SOSY injection Inject 0.3 mLs (60 mcg total) into the skin every Saturday at 6 PM. 03/24/21  Yes Katsadouros, Vasilios, MD  hydrALAZINE (APRESOLINE) 25 MG tablet Take 1 tablet (25 mg total) by mouth every 8 (eight) hours. 07/13/21  Yes Virl Axe, MD  HYDROmorphone (DILAUDID) 2 MG tablet Take 1 tablet (2 mg total) by mouth  daily as needed for up to 7 days for severe pain. 07/13/21 07/23/21 Yes Virl Axe, MD  naproxen sodium (ALEVE) 220 MG tablet Take 220 mg by mouth daily as needed (breakthrough pain).   Yes [provider]  sodium bicarbonate 650 MG tablet Take 2 tablets (1,300 mg total) by mouth 2 (two) times daily. 07/13/21  Yes  Virl Axe, MD  topiramate (TOPAMAX) 25 MG tablet Take 1 tablet (25 mg total) by mouth at bedtime. 07/13/21  Yes Virl Axe, MD  venlafaxine XR (EFFEXOR XR) 37.5 MG 24 hr capsule Take 1 capsule (37.5 mg total) by mouth daily. 07/13/21  Yes Virl Axe, MD  Blood Glucose Monitoring Suppl (ACCU-CHEK AVIVA PLUS) W/DEVICE KIT 1 kit by Does not apply route as directed. 05/15/11   Bartholomew Crews, MD  Insulin Glargine (BASAGLAR KWIKPEN) 100 UNIT/ML SOPN Inject 0.2 mLs (20 Units total) into the skin daily. Patient not taking: Reported on 07/04/2021 05/28/19   Asencion Noble, MD  Lancet Devices [facility]) lancets Use as instructed Patient not taking: Reported on 05/22/2021 10/21/16   Dellia Nims, MD  metroNIDAZOLE (METROGEL) 0.75 % gel Apply 1 application topically 2 (two) times daily. Patient not taking: No sig reported 05/28/21   Masters, Joellen Jersey, DO     Vitals:   07/17/21 1101 07/17/21 1200 07/17/21 1300 07/17/21 1400  BP:  90/66 (!) 126/35 (!) 117/44  Pulse:  (!) 49 (!) 50 (!) 47  Resp:  _0 Temp:      TempSrc:      SpO2: 100% 100% 100% 99%  Weight:      Height:       Exam Gen on vent, not responsive No rash, cyanosis or gangrene Sclera anicteric, throat w/ ETT No jvd or bruits Chest clear anterior/ lateral RRR no MRG Abd soft ntnd no mass or ascites +bs GU normal MS no joint effusions or deformity Ext 1-2+ LE > UE edema, bilat heel pressure areas covered  Neuro is on vent, not responsive      Home meds include - norvasc, hydralazine, nsaid, dilaudid po prn, sod bicarb 1.3gm bid, topamax, venlafaxine xr, insulin glargine, prns  and vitamins  UA 10/17 - prot > 300, 20-50 wbc, 0-5 rbc   CXR - bilat lower lobe airspace disease    Na 134  K 4.3  Co2 14  aG  14  BUN 85  Cr 5.46  WBC 10K Hb 12     BP 117/ 44  HR 55  RR 20  temp 96.8       Date   Creat  egFR   2007- 2013  0.65- 1.27   2015- 2017  1.18- 2.35   2018   1.84- 2.54   2019   3.02- 3.44   Mar -may 2020 3.6- 4.6   July -aug 2020 6.09 >> 5.04 AKI episode   Jan 2021  4.15 March 2020  6.39   June 2022  6.84- 7.12 6- 7 ml/min        April 05 2021  5.42     Oct 17  5.34  9   Jul 17, 2021  5.46  8 ml/min  Assessment/ Plan: CKD V - b/l creat from June 2022 is 6.8- 7.1.  Creat this admit is 5.3 which is better than in June. eGFR here is 8-9 ml/min.  In setting of AMS, hypothermia, probable UTI and bibasilar PNA w/ sepsis. Also possible myxedeam coma w/ TSH of 140.  Renal function here is no worse than June admit, even a little better. Cannot rule out uremia w/ patient obtunded on life support. Had long discussion w/ pt's daughter.  Pt has serious comorbidities and debility, lives bed-to-wheelchair, and has significant issues w/ chronic pain. Recommended against HD as pt would likely not tolerate it well it well and we would likely  see a significant decline in her QOL. Daughter agrees that it would be "tough" on her and states that she wants to the focus on "keeping her comfortable".  She has lots of questions about how to get POA, will defer to nursing/ primary team on that.  Plan is for supportive care, and no dialysis.   Sepsis/ hypothermia/ UTI/ bilat PNA/ myxedema coma - getting IV abx and T4 Anemia -severe, Hb 3's, sp 5u prbcs DM2  on insulin HTN / volume - has diffuse 1-2+ edema, BP's not high, holding home meds COPD Debility - is bed to WC at home      Kelly Splinter  MD 07/17/2021, 3:19 PM  Recent Labs  Lab 07/17/2021 1747 07/17/21 0953  WBC 7.2 10.1  HGB 3.0* 12.6   Recent Labs  Lab 07/29/2021 1747 07/17/21 0953  K 4.6 4.3  BUN 84* 85*  CREATININE  5.34* 5.46*  CALCIUM 7.0* 6.8*  PHOS  --  7.5*

## 2021-07-17 NOTE — Progress Notes (Addendum)
NAME:  Kayla [name], MRN:  [id number], DOB:  1960-10-03, LOS: 1 ADMISSION DATE:  07/28/2021, CONSULTATION DATE:  07/02/2021 REFERRING MD:  Darl Householder, ED  CHIEF COMPLAINT:  altered, short of breath   History of Present Illness:  60 yo woman with hx of HTN, HLD, fibromyalgia, graves disease, DM 2, and CKD 5 presenting 10/17 with one day history of altered mental status, found to be hypoxic, hypothermic, anemic with Hgb 3, lactic acidosis, and hypertensive.  Requiring intubation in ER, started on broad spectrum antibiotics.  Some concern for UGIB, started on PPI gtt.  Stool hemoccult negative.  CXR showing bilateral lower lung airspace disease, L>R, and found to be COVID positive.    Pertinent  Medical History  Arthritis Hx of cellulitis COPD CKD 5- not on dialysis Deafness (r ear) DM  Peripheral neuropathy Fibromyalgia Hld HTN Hypothyroidism Graves dz hx-> hypothyroidism (untreated) Chronic met acidosis?   Meds; amlodipine, aranesp, hydralazine, dilaudid, sodium bicarb, insulin (glargine), topamax 25 qhs, venlafaxine XR 37.5.   PCP virtual visiton 07/13/21.  Worsening mobility over a few months.  Unable to refill medications.   Chronic pain.   Significant Hospital Events: Including procedures, antibiotic start and stop dates in addition to other pertinent events   10/18 Admitted with pneumonia, sepsis, anemia, and possible myedema coma- intubated, transfused 5 units PRBC  Interim History / Subjective:   Ongoing SB since admit, BP stable Transfused 5 units PRBC overnight Attempted to wean sedation this morning but became severely agitated  Objective   Blood pressure (!) 121/34, pulse (!) 52, temperature (!) 96.8 F (36 C), temperature source Core, resp. rate (!) 22, height _0  (1.575 m), weight 57.8 kg, last menstrual period 01/12/2014, SpO2 100 %.    Vent Mode: PRVC FiO2 (%):  [40 %-100 %] 40 % Set Rate:  [15 bmp-20 bmp] 20 bmp Vt Set:  [400 mL-500 mL] 400 mL PEEP:  [5 cmH20-8  cmH20] 8 cmH20 Plateau Pressure:  [17 cmH20-26 cmH20] 24 cmH20   Intake/Output Summary (Last 24 hours) at 07/17/2021 1156 Last data filed at 07/17/2021 1100 Gross per 24 hour  Intake 6380.06 ml  Output 295 ml  Net 6085.06 ml   Filed Weights   07/11/2021 2200  Weight: 57.8 kg    Examination: General:  Acute on chronically ill female sedated on MV HEENT: MM pink/moist, ETT/ OGT (bilious output), exopthalmos, pupils 4/reactive Neuro: sedated CV: rr, no murmur PULM:  non labored on MV, diffuse rhonchi and faint exp wheeze, minimal secretions GI: soft, bsx4 active  Extremities: warm/dry, +1 pitting LE edema  Skin: multiple mepilex dressings to feet  UOP 275 ml/hr Net +5.1L  Labs reviewed: bicarb 14, BUN 85, sCr 5.34- 5.46, AG 14, phos 7.5, normal WBC 10.1, Hgb 12.6, Hct 37, plt 128-> 79  CXR today showing stable ETT, unchanged bibasilar airspace disease  Resolved Hospital Problem list     Assessment & Plan:   Decompensated hypothyroidism, hx of untreated hypothyroidism after Graves presenting with hypothermia, bradycardia, and AMS.  She does have other reasons to have these symptoms given hypoxic respiratory failure, pneumonia, and COVID - TSH 140, T4 free < 0.25 - monitor tele for conduction blocks/ Qtc - ongoing infectious workup as below - has not been hypoglycemic and normal Na - will stop solu-cortef and change to solumedrol as below for COVID tx - Pending cortisol  - s/p 200 mcg x 1, continue IV synthroid  - Last TTE 02/2021->  with LVEF 65- 70%, severe LDH, G1DD,  RV systolic hyperdynamic, normal PAP; Severe LV and RV wall thickening, findings may suggest infiltrative cardiomyopathy. Consider further testing  for amyloid wtih PYP and/or cMRI.   Repeat TTE pending  Severe acute on chronic normocytic anemia Concern for UGIB - had some slight dark red tinged fluid in OGT initially but since is bilious appearing; no other sites of bleeding noted  - stool hemoccult  negative - transfused 5 units PRBC with Hgb 3-> corrected to 12.6.  Suspect possible erroneous initial Hgb, given over correction, no obvious source, and has remained normotensive - restarting VTE ppx, monitor closely - change PPI gtt-> daily   Hypoxic respiratory failure with bibasilar consolidations, possibly viral COVID positive  - continue full MV support, PRVC 8cc/kg IBW with goal Pplat <30 and DP<15  - current P/F ratio 162, DP 16, and Pplat 24 - VAP prevention protocol/ PPI - PAD protocol for sedation> fentanyl gtt and low dose propofol; ideally would like to switch to precedex to start weaning efforts, however given bradycardia, can not - wean FiO2 as able for SpO2 >92%  - daily SAT & SBT - intermittent CXR - follow trach asp - continue airborne precautions - deescalate to CAP coverage, last hospitalization in June 2022 - awaiting urine strep and legionella   - sending inflammatory markers, considering baricitinib - on Remdesivir, will d/c solucortef and start solumedrol 51m/kg for 3-5 days then taper.  - check PCT   CKD stage 5 Acute on chronic non anion gap acidosis Hyperphos Lactic acidosis- improving - Appreciate Nephrology input - currently no acute electrolyte disturbances for starting HD - bicarb gtt per renal - continue foley with strict I/Os - trend renal indices   R/o UTI/ sepsis  - small leukocytes, many bacteria, few squamous cells and WBC 21-50-?dirty urine - hemodynamically stable thus far - follow culture data - checking PCT  - continue cefepime for now  Thrombocytopenia - chronic appearing, trend CBC  DTP- coccyx and right heel, present on admit - WOC consult   Hx DM, euglycemia thus far - although HA1C was 5.4 in 03/2021 - monitor on CBG given we are adding steroids, add SSI if > 180  HTN - holding home amlodipine and hydralazine for now with ongoing sedation  Best Practice (right click and "Reselect all SmartList Selections" daily)    Diet/type: NPO; start TF DVT prophylaxis: prophylactic heparin  GI prophylaxis: PPI Lines: N/A Foley:  Yes, and it is still needed Code Status:  full code Last date of multidisciplinary goals of care discussion:  Son updated at bedside  Labs   CBC: Recent Labs  Lab 07/28/2021 1747 07/17/21 0953  WBC 7.2 10.1  NEUTROABS 6.7  --   HGB 3.0* 12.6  HCT 9.4* 37.0  MCV 97.9 89.8  PLT 128* 79*    Basic Metabolic Panel: Recent Labs  Lab 07/20/2021 1747 07/17/21 0953  NA 138 138  K 4.6 4.3  CL 116* 110  CO2 9* 14*  GLUCOSE 130* 109*  BUN 84* 85*  CREATININE 5.34* 5.46*  CALCIUM 7.0* 6.8*  PHOS  --  7.5*   GFR: Estimated Creatinine Clearance: 8.8 mL/min (A) (by C-G formula based on SCr of 5.46 mg/dL (H)). Recent Labs  Lab 07/26/2021 1747 07/21/2021 1834 07/17/21 0953  WBC 7.2  --  10.1  LATICACIDVEN 4.3* 2.4*  --     Liver Function Tests: Recent Labs  Lab 07/13/2021 1747 07/17/21 0953  AST 29  --   ALT 11  --  ALKPHOS 108  --   BILITOT 0.8  --   PROT 5.7*  --   ALBUMIN 3.1* 2.7*   No results for input(s): LIPASE, AMYLASE in the last 168 hours. No results for input(s): AMMONIA in the last 168 hours.  ABG    Component Value Date/Time   PHART 7.266 (L) 07/17/2021 1108   PCO2ART 29.0 (L) 07/17/2021 1108   PO2ART 65.6 (L) 07/17/2021 1108   HCO3 12.8 (L) 07/17/2021 1108   TCO2 29.2 08/17/2012 2330   ACIDBASEDEF 12.7 (H) 07/17/2021 1108   O2SAT 92.5 07/17/2021 1108     Coagulation Profile: Recent Labs  Lab 07/12/2021 1747 07/17/21 0953  INR 1.3* 1.2    Cardiac Enzymes: No results for input(s): CKTOTAL, CKMB, CKMBINDEX, TROPONINI in the last 168 hours.  HbA1C: Hemoglobin A1C  Date/Time Value Ref Range Status  04/05/2021 02:15 PM 5.4 4.0 - 5.6 % Final  03/27/2020 03:15 PM 5.2 4.0 - 5.6 % Final   Hgb A1c MFr Bld  Date/Time Value Ref Range Status  04/26/2019 09:10 PM 5.0 4.8 - 5.6 % Final    Comment:    (NOTE) Pre diabetes:           5.7%-6.4% Diabetes:              >6.4% Glycemic control for   <7.0% adults with diabetes   06/27/2014 05:25 PM 9.7 (H) <5.7 % Final    Comment:    (NOTE)                                                                       According to the ADA Clinical Practice Recommendations for 2011, when HbA1c is used as a screening test:  >=6.5%   Diagnostic of Diabetes Mellitus           (if abnormal result is confirmed) 5.7-6.4%   Increased risk of developing Diabetes Mellitus References:Diagnosis and Classification of Diabetes Mellitus,Diabetes ONGE,9528,41(LKGMW 1):S62-S69 and Standards of Medical Care in         Diabetes - 2011,Diabetes Care,2011,34 (Suppl 1):S11-S61.    CBG: Recent Labs  Lab 07/15/2021 2318 07/17/21 0342 07/17/21 0801  GLUCAP 177* 129* 108*   Critical care time: 45 min.        Kennieth Rad, ACNP [facility] 07/17/2021, 11:56 AM  See Amion for pager If no response to pager, please call PCCM consult pager After 7:00 pm call Elink

## 2021-07-17 NOTE — Progress Notes (Deleted)
This RN provided discharge instructions to patient. Pt verbalized understanding. All questions answered. Belongings (clothing and earrings) returned. PIVs removed. Patient transferred to main entrance via wheelchair by Nurse Tech. Per Nurse Tech , patient safely ambulated to car.

## 2021-07-18 ENCOUNTER — Inpatient Hospital Stay: Payer: Self-pay

## 2021-07-18 ENCOUNTER — Encounter (HOSPITAL_COMMUNITY): Payer: Self-pay

## 2021-07-18 DIAGNOSIS — U071 COVID-19: Secondary | ICD-10-CM

## 2021-07-18 LAB — CBC WITH DIFFERENTIAL/PLATELET
Abs Immature Granulocytes: 0.25 10*3/uL — ABNORMAL HIGH (ref 0.00–0.07)
Basophils Absolute: 0.1 10*3/uL (ref 0.0–0.1)
Basophils Relative: 1 %
Eosinophils Absolute: 0.2 10*3/uL (ref 0.0–0.5)
Eosinophils Relative: 1 %
HCT: 33.8 % — ABNORMAL LOW (ref 36.0–46.0)
Hemoglobin: 12 g/dL (ref 12.0–15.0)
Immature Granulocytes: 2 %
Lymphocytes Relative: 4 %
Lymphs Abs: 0.6 10*3/uL — ABNORMAL LOW (ref 0.7–4.0)
MCH: 31 pg (ref 26.0–34.0)
MCHC: 35.5 g/dL (ref 30.0–36.0)
MCV: 87.3 fL (ref 80.0–100.0)
Monocytes Absolute: 0.2 10*3/uL (ref 0.1–1.0)
Monocytes Relative: 1 %
Neutro Abs: 13.2 10*3/uL — ABNORMAL HIGH (ref 1.7–7.7)
Neutrophils Relative %: 91 %
Platelets: 79 10*3/uL — ABNORMAL LOW (ref 150–400)
RBC: 3.87 MIL/uL (ref 3.87–5.11)
RDW: 16.2 % — ABNORMAL HIGH (ref 11.5–15.5)
WBC: 14.6 10*3/uL — ABNORMAL HIGH (ref 4.0–10.5)
nRBC: 0.5 % — ABNORMAL HIGH (ref 0.0–0.2)

## 2021-07-18 LAB — FERRITIN: Ferritin: 750 ng/mL — ABNORMAL HIGH (ref 11–307)

## 2021-07-18 LAB — COMPREHENSIVE METABOLIC PANEL
ALT: 10 U/L (ref 0–44)
AST: 28 U/L (ref 15–41)
Albumin: 2.4 g/dL — ABNORMAL LOW (ref 3.5–5.0)
Alkaline Phosphatase: 97 U/L (ref 38–126)
Anion gap: 17 — ABNORMAL HIGH (ref 5–15)
BUN: 74 mg/dL — ABNORMAL HIGH (ref 6–20)
CO2: 13 mmol/L — ABNORMAL LOW (ref 22–32)
Calcium: 6.6 mg/dL — ABNORMAL LOW (ref 8.9–10.3)
Chloride: 107 mmol/L (ref 98–111)
Creatinine, Ser: 5.35 mg/dL — ABNORMAL HIGH (ref 0.44–1.00)
GFR, Estimated: 9 mL/min — ABNORMAL LOW (ref >60)
Glucose, Bld: 145 mg/dL — ABNORMAL HIGH (ref 70–99)
Potassium: 4.3 mmol/L (ref 3.5–5.1)
Sodium: 137 mmol/L (ref 135–145)
Total Bilirubin: 0.9 mg/dL (ref 0.3–1.2)
Total Protein: 5.1 g/dL — ABNORMAL LOW (ref 6.5–8.1)

## 2021-07-18 LAB — BLOOD GAS, ARTERIAL
Acid-base deficit: 8.5 mmol/L — ABNORMAL HIGH (ref 0.0–2.0)
Bicarbonate: 13.8 mmol/L — ABNORMAL LOW (ref 20.0–28.0)
FIO2: 40
O2 Saturation: 97.9 %
Patient temperature: 98.1
pCO2 arterial: 20 mmHg — ABNORMAL LOW (ref 32.0–48.0)
pH, Arterial: 7.45 (ref 7.350–7.450)
pO2, Arterial: 88.4 mmHg (ref 83.0–108.0)

## 2021-07-18 LAB — D-DIMER, QUANTITATIVE: D-Dimer, Quant: 6.7 ug/mL-FEU — ABNORMAL HIGH (ref 0.00–0.50)

## 2021-07-18 LAB — GLUCOSE, CAPILLARY
Glucose-Capillary: 144 mg/dL — ABNORMAL HIGH (ref 70–99)
Glucose-Capillary: 130 mg/dL — ABNORMAL HIGH (ref 70–99)
Glucose-Capillary: 126 mg/dL — ABNORMAL HIGH (ref 70–99)
Glucose-Capillary: 111 mg/dL — ABNORMAL HIGH (ref 70–99)
Glucose-Capillary: 112 mg/dL — ABNORMAL HIGH (ref 70–99)
Glucose-Capillary: 127 mg/dL — ABNORMAL HIGH (ref 70–99)

## 2021-07-18 LAB — PROCALCITONIN: Procalcitonin: 28.9 ng/mL

## 2021-07-18 LAB — TRIGLYCERIDES: Triglycerides: 272 mg/dL — ABNORMAL HIGH (ref <150)

## 2021-07-18 LAB — PHOSPHORUS: Phosphorus: 7 mg/dL — ABNORMAL HIGH (ref 2.5–4.6)

## 2021-07-18 LAB — MAGNESIUM: Magnesium: 1.3 mg/dL — ABNORMAL LOW (ref 1.7–2.4)

## 2021-07-18 LAB — C-REACTIVE PROTEIN: CRP: 21.3 mg/dL — ABNORMAL HIGH (ref <1.0)

## 2021-07-18 LAB — TSH: TSH: 76.395 u[IU]/mL — ABNORMAL HIGH (ref 0.350–4.500)

## 2021-07-18 MED ORDER — SODIUM CHLORIDE 0.9% FLUSH
10.0000 mL | Freq: Two times a day (BID) | INTRAVENOUS | Status: DC
  Administered 2021-07-18: 40 mL
  Administered 2021-07-19: 30 mL
  Administered 2021-07-19 – 2021-07-23 (×8): 10 mL
  Administered 2021-07-24: 20 mL
  Administered 2021-07-24 – 2021-07-25 (×2): 10 mL

## 2021-07-18 MED ORDER — VITAL HIGH PROTEIN PO LIQD
1000.0000 mL | ORAL | Status: DC
Start: 2021-07-18 — End: 2021-07-24
  Administered 2021-07-18 – 2021-07-22 (×5): 1000 mL

## 2021-07-18 MED ORDER — MAGNESIUM SULFATE 2 GM/50ML IV SOLN
2.0000 g | Freq: Once | INTRAVENOUS | Status: AC
  Administered 2021-07-18: 2 g via INTRAVENOUS
  Filled 2021-07-18: qty 50

## 2021-07-18 MED ORDER — CLONAZEPAM 0.5 MG PO TABS: 0.2500 mg | ORAL_TABLET | Freq: Three times a day (TID) | ORAL | Status: DC

## 2021-07-18 MED ORDER — CLONAZEPAM 0.5 MG PO TABS
0.2500 mg | ORAL_TABLET | Freq: Three times a day (TID) | ORAL | Status: DC
  Administered 2021-07-18: 0.25 mg
  Filled 2021-07-18: qty 1

## 2021-07-18 MED ORDER — INSULIN ASPART 100 UNIT/ML IJ SOLN
0.0000 [IU] | INTRAMUSCULAR | Status: DC
  Administered 2021-07-18 – 2021-07-19 (×2): 2 [IU] via SUBCUTANEOUS
  Administered 2021-07-19 (×5): 3 [IU] via SUBCUTANEOUS
  Administered 2021-07-19: 2 [IU] via SUBCUTANEOUS
  Administered 2021-07-20 – 2021-07-21 (×6): 3 [IU] via SUBCUTANEOUS
  Administered 2021-07-21 – 2021-07-22 (×4): 2 [IU] via SUBCUTANEOUS
  Administered 2021-07-22 – 2021-07-23 (×5): 3 [IU] via SUBCUTANEOUS
  Administered 2021-07-23: 2 [IU] via SUBCUTANEOUS
  Administered 2021-07-23 (×2): 3 [IU] via SUBCUTANEOUS
  Administered 2021-07-23: 5 [IU] via SUBCUTANEOUS
  Administered 2021-07-25: 2 [IU] via SUBCUTANEOUS

## 2021-07-18 MED ORDER — OXYCODONE HCL 5 MG PO TABS
5.0000 mg | ORAL_TABLET | Freq: Two times a day (BID) | ORAL | Status: DC
Start: 2021-07-18 — End: 2021-07-21
  Administered 2021-07-18 – 2021-07-20 (×6): 5 mg
  Filled 2021-07-18 (×6): qty 1

## 2021-07-18 MED ORDER — SODIUM CHLORIDE 0.9% FLUSH: 10.0000 mL | INTRAVENOUS | Status: DC | PRN

## 2021-07-18 MED ORDER — CLONAZEPAM 0.125 MG PO TBDP
0.2500 mg | ORAL_TABLET | Freq: Three times a day (TID) | ORAL | Status: DC
  Administered 2021-07-18 – 2021-07-21 (×8): 0.25 mg
  Filled 2021-07-18 (×8): qty 2

## 2021-07-18 MED ORDER — ALBUTEROL SULFATE (2.5 MG/3ML) 0.083% IN NEBU
2.5000 mg | INHALATION_SOLUTION | Freq: Four times a day (QID) | RESPIRATORY_TRACT | Status: DC
  Administered 2021-07-18 – 2021-07-20 (×11): 2.5 mg via RESPIRATORY_TRACT
  Filled 2021-07-18 (×10): qty 3

## 2021-07-18 MED ORDER — ARTIFICIAL TEARS OPHTHALMIC OINT
TOPICAL_OINTMENT | Freq: Three times a day (TID) | OPHTHALMIC | Status: DC
  Administered 2021-07-18 – 2021-07-25 (×4): 1 via OPHTHALMIC
  Filled 2021-07-18: qty 3.5

## 2021-07-18 NOTE — Progress Notes (Signed)
Patient's wound is on sacrum is open and pink in the center no drainage noted. Mepliex applied. Will continue to monitor.

## 2021-07-18 NOTE — Progress Notes (Signed)
PHARMACY - PHYSICIAN COMMUNICATION CRITICAL VALUE ALERT - BLOOD CULTURE IDENTIFICATION (BCID)  Kayla [name] is an 60 y.o. female who presented to [name] on 07/03/2021 with a chief complaint of sepsis  Assessment: GPRods, aerobic bottle, 1/4  Name of physician (or Provider) Contacted: [place]  Current antibiotics: azith, CTX  Changes to prescribed antibiotics recommended:  Probable contaminant, no changes  No results found for this or any previous visit. Dolly Rias RPh 07/18/2021, 3:52 AM

## 2021-07-18 NOTE — Progress Notes (Signed)
eLink Physician-Brief Progress Note Patient Name: Kayla [name] DOB: 1961/01/19 MRN: [id number]   Date of Service  07/18/2021  HPI/Events of Note  Triglycerides 272 and pt on Prop  eICU Interventions  If TG value rises further, we may need to lower or even stop the prop.  Follow serial values.     Intervention Category Minor Interventions: Other:  [place] Dome 07/18/2021, 5:37 AM

## 2021-07-18 NOTE — Progress Notes (Signed)
[facility] Progress Note  Subjective: UOP 350 cc yesterday, creat 5.3 today, I/O 6 L in yest  Vitals:   07/18/21 0331 07/18/21 0335 07/18/21 0400 07/18/21 0500  BP:   (!) 106/52 (!) 120/47  Pulse:   (!) 51 (!) 54  Resp:   20 19  Temp:  98.1 F (36.7 C)  97.9 F (36.6 C)  TempSrc:  Core  Core  SpO2: 97%  99% 96%  Weight:      Height:        Exam: on vent ,sedated  no jvd  throat ett in place  Chest cta bilat and lat  Cor reg no RG  Abd soft ntnd no ascites   Ext 1+ LE/ UE edema   Neuro on vent and sedated       Date                          Creat               egFR   2007- 2013                0.65- 1.27   2015- 2017                1.18- 2.35   2018                          1.84- 2.54   2019                          3.02- 3.44   Mar -may 2020          3.6- 4.6   July -aug 2020          6.09 >> 5.04    AKI episode   Jan 2021                   4.15 March 2020                  6.39   June 2022                 6.84- 7.12        6- 7 ml/min                                             April 05 2021                5.42                    Oct 17                       5.34                 9   Jul 17, 2021             5.46                 8 ml/min   Home meds include - norvasc, hydralazine, nsaid, dilaudid po prn, sod bicarb 1.3gm bid, topamax, venlafaxine xr, insulin glargine, prns and vitamins   UA 10/17 - prot > 300, 20-50 wbc, 0-5 rbc   CXR - bilat  lower lobe airspace disease     Assessment/ Plan: CKD V - b/l creat from June 2022 is 6.8- 7.1.  Creat here 5.3,  eGFR here is 8-9 ml/min, in setting of AMS, hypothermia, probable UTI, myxedema coma and bibasilar PNA w/ sepsis. Cannot rule out uremia w/ patient obtunded on life support. D/w family yesterday, pt is a poor candidate for dialysis, continue medical care for now, but no dialysis. Creat stable , making some urine, hold IVF"s today (I >> O here so far).    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  D/w daughter at length yesterday. Pt is now a limited DNR.        Rob Patrena Santalucia 07/18/2021, 8:31 AM   Recent Labs  Lab 07/17/21 0953 07/18/21 0322  K 4.3 4.3  BUN 85* 74*  CREATININE 5.46* 5.35*  CALCIUM 6.8* 6.6*  PHOS 7.5*  --   HGB 12.6 12.0   Inpatient medications:  sodium chloride   Intravenous Once   chlorhexidine gluconate (MEDLINE KIT)  15 mL Mouth Rinse BID   Chlorhexidine Gluconate Cloth  6 each Topical Daily   docusate  100 mg Per Tube BID   heparin injection (subcutaneous)  5,000 Units Subcutaneous Q8H   insulin aspart  1-3 Units Subcutaneous Q4H   levothyroxine  50 mcg Intravenous Daily   mouth rinse  15 mL Mouth Rinse 10 times per day   methylPREDNISolone (SOLU-MEDROL) injection  57 mg Intravenous Daily   pantoprazole (PROTONIX) IV  40 mg Intravenous Q24H   polyethylene glycol  17 g Per Tube Daily    sodium chloride     azithromycin Stopped (07/17/21 1655)   cefTRIAXone (ROCEPHIN)  IV Stopped (07/17/21 1823)   fentaNYL infusion INTRAVENOUS 50 mcg/hr (07/18/21 0500)   propofol (DIPRIVAN) infusion 5 mcg/kg/min (07/18/21 0500)   remdesivir 100 mg in NS 100 mL     sodium bicarbonate 150 mEq in D5W infusion 100 mL/hr at 07/18/21 0804   artificial tears, docusate, fentaNYL

## 2021-07-18 NOTE — Progress Notes (Signed)
Nurse applied soft restraints to patient after agitation noted during morning assessment. Patient unable to follow commands. Soft wrist restraints applied bilaterally. Primary team notified. Will continue to monitor.

## 2021-07-18 NOTE — Progress Notes (Signed)
[place] Progress Note Patient Name: Kayla [name] DOB: 1961/02/04 MRN: [id number]   Date of Service  07/18/2021  HPI/Events of Note  Mag 1.3  eICU Interventions  Mag replaced.     Intervention Category Minor Interventions: Electrolytes abnormality - evaluation and management  Tilden Dome 07/18/2021, 10:28 PM

## 2021-07-18 NOTE — Progress Notes (Addendum)
NAME:  Kayla [name], MRN:  [id number], DOB:  Oct 16, 1960, LOS: 2 ADMISSION DATE:  07/15/2021, CONSULTATION DATE:  07/07/2021 REFERRING MD:  Darl Householder, ED  CHIEF COMPLAINT:  altered, short of breath   History of Present Illness:  60 yo woman with hx of HTN, HLD, fibromyalgia, graves disease, DM 2, and CKD 5 presenting 10/17 with one day history of altered mental status, found to be hypoxic, hypothermic, anemic with Hgb 3, lactic acidosis, and hypertensive.  Requiring intubation in ER, started on broad spectrum antibiotics.  Some concern for UGIB, started on PPI gtt.  Stool hemoccult negative.  CXR showing bilateral lower lung airspace disease, L>R, and found to be COVID positive.    Pertinent  Medical History  Arthritis Hx of cellulitis COPD CKD 5- not on dialysis Deafness (r ear) DM  Peripheral neuropathy Fibromyalgia Hld HTN Hypothyroidism Graves dz hx-> hypothyroidism (untreated) Chronic met acidosis?   Meds; amlodipine, aranesp, hydralazine, dilaudid, sodium bicarb, insulin (glargine), topamax 25 qhs, venlafaxine XR 37.5.   PCP virtual visiton 07/13/21.  Worsening mobility over a few months.  Unable to refill medications.   Chronic pain.   Significant Hospital Events: Including procedures, antibiotic start and stop dates in addition to other pertinent events   10/17 Admitted with pneumonia, sepsis, anemia, and possible myedema coma- intubated, transfused 5 units PRBC, COVID + 10/18  Agitated on WUA, Hgb stable, changed to limited code, no CPR    10/18 TTE >>  1. LVEF 50%. Mild LV decreased function, LV w/global hypokinesis, severe LVH, G2DD. Average left ventricular global longitudinal strain is -9.1 %. The global  longitudinal strain is abnormal. Longitudinal strain at apex is preserved  in bullseye pattern (can see with cardiac amyloidosis).   2. RV systolic normal. RV size is normal. Moderately increased RV wall thickness.  Normal PASP.  Estimated RV systolic pressure is 81.2  mmHg.   3. LA moderately dilated.   4. MV normal structure. Trivial MVR. No evidence of MS.  5. AV is tricuspid. AVR is not visualized. Mild to moderate aortic valve sclerosis/calcification is present, without any evidence of AS.   6. IVC dilated in size with <50% respiratory variability, suggesting RA pressure of 15 mmHg.   7. LVH and RVH with bullseye LV longitudinal strain pattern are  concerning for cardiac amyloidosis.  10/17 SARS/ flu >> positive  10/17 MRSA pcr > neg 10/17 UC > 10/17 BC > 1/4 GPR >  10/17 vanc/ flagyl/ cefepime 10/18 azithro > 10/18 ceftriaxone >  10/17 remdesivir >  Interim History / Subjective:   Bite block placed yesterday afternoon as patient clenching with protruding tongue starting to swell Very agitated this morning with family at bedside, requiring restraints and increased sedation  Objective   Blood pressure (!) 120/47, pulse (!) 54, temperature 97.9 F (36.6 C), temperature source Core, resp. rate 19, height '5\' 2"'  (1.575 m), weight 57.8 kg, last menstrual period 01/12/2014, SpO2 96 %.    Vent Mode: PRVC FiO2 (%):  [40 %] 40 % Set Rate:  [20 bmp] 20 bmp Vt Set:  [400 mL] 400 mL PEEP:  [8 cmH20] 8 cmH20 Plateau Pressure:  [20 cmH20-26 cmH20] 20 cmH20   Intake/Output Summary (Last 24 hours) at 07/18/2021 0713 Last data filed at 07/18/2021 0500 Gross per 24 hour  Intake 4031.27 ml  Output 151 ml  Net 3880.27 ml   Filed Weights   07/02/2021 2200  Weight: 57.8 kg    Examination:   General:  AoC ill appearing  adult female sedated on MV HEENT: MM pink/moist, ETT/ OGT- with bite block, pupils 3/reactive, exopthalmos with dryness Neuro:  sedated after recent bolus on fentanyl and propofol, not opening eyes or moving spont CV: SB, no murmur PULM:  non labored on MV, diffuse rhonchi/ exp wheeze GI: soft, +bs, NT, foley  Extremities: warm/dry, +1 generalized edema, mepilex dressings to heels/feet Skin: no rashes   UOP 275 decreasing > 151  ml/ 24hr +3.8/ net +9L   Labs reviewed: bicarb 14-> 13, BUN 85-> 74, sCr 5.34- 5.46-> 5.35, AG 14-> 17, WBC 10.1-> 14.6, Hgb 12.6-> 12, Hct 37-> 33.8, plt 128-> 79-> 79 PCT 27-> 28.9 UDS positive opiates / THC CRP 21.3, ferritin 750, fibrinogen 354, d-dimer 6.7 No imaging today   Resolved Hospital Problem list     Assessment & Plan:   Decompensated hypothyroidism, hx of untreated hypothyroidism after Graves presenting with hypothermia, bradycardia, and AMS.  She does have other reasons to have these symptoms given hypoxic respiratory failure, pneumonia, and COVID - TSH 140, T4 free < 0.25 - monitor tele for conduction blocks/ Qtc - ongoing infectious workup as below - cortisol > 100 - continue daily IV synthroid - TTE as above - ongoing supportive measures - prn warming blanket  Severe acute on chronic normocytic anemia Concern for UGIB on admit, doubtful, OGT output bilious, stool hemoccult - - s/p 5 units PRBC 10/17, no evidence of bleeding  - stable H/H, trend on CBC  Hypoxic respiratory failure with bibasilar consolidations, possibly viral COVID positive  - continue full MV support, PRVC 8cc/kg IBW with goal Pplat <30 and DP<15  - current P/F ratio 220, DP 13 , and Pplat 21 > improving - Borderline resp alk on am ABG, decrease rate 20 >15 - VAP prevention protocol/ PPI - PAD protocol for sedation> fentanyl gtt and low dose propofol, monitor triglycerides, 272; will add enteral oxy and low dose klonopin as we are unable to change to precedex given bradycardia  - wean FiO2 as able for SpO2 >92%  - daily SAT & SBT as tolerated  - intermittent CXR - continue airborne precautions - Continue CAP coverage  - urine strep ag negative, pending legionella  - trending inflammatory markers, CRP > 20, plan to add baricitinib  - continue Remdesivir, continue solumedrol 43m/kg day 2/x, start taper after 5 days.   CKD stage 5 Acute on chronic non anion gap acidosis Hyperphos -  Appreciate Nephrology input, HD not recommended given multiple comorbidities and debility; poor medical compliance, daughter agreeable  - bicarb gtt per renal-> ? Change to enteral  - continue foley with strict I/Os - trend renal indices   R/o UTI/ sepsis  - remains hemodynamically stable - hemodynamically stable thus far - BC thus far 1/4 GPR > likely contaminant  - follow culture data - trend PCT  27 > 28.9 > - continue cefepime for now  Thrombocytopenia - chronic appearing, stable - trend CBC  DTP- coccyx and right heel, present on admit - WOC consulted   Hx DM, euglycemia thus far - although HA1C was 5.4 in 03/2021 - likely to be exacerbated by steroids - SSI sensitive  HTN - holding home amlodipine and hydralazine for now with ongoing sedation  LVH and RVH with bullseye LV longitudinal strain pattern are  concerning for cardiac amyloidosis - will need further outpatient workup with cardiac MRI or PYP  Best Practice (right click and "Reselect all SmartList Selections" daily)   Diet/type: NPO; TF DVT prophylaxis: prophylactic  heparin  GI prophylaxis: PPI Lines: N/A - few PIV sites, difficult venous access for blood draws, will order PICC, cleared with Nephrology given no plans for HD Foley:  Yes, and it is still needed Code Status:  limited- no CPR Last date of multidisciplinary goals of care discussion:  Son and daughter updated 10/18.  Pt's niece updated at bedside this morning.   Labs   CBC: Recent Labs  Lab 07/15/2021 1747 07/17/21 0953 07/18/21 0322  WBC 7.2 10.1 14.6*  NEUTROABS 6.7  --  13.2*  HGB 3.0* 12.6 12.0  HCT 9.4* 37.0 33.8*  MCV 97.9 89.8 87.3  PLT 128* 79* 79*    Basic Metabolic Panel: Recent Labs  Lab 07/07/2021 1747 07/17/21 0953 07/18/21 0322  NA 138 138 137  K 4.6 4.3 4.3  CL 116* 110 107  CO2 9* 14* 13*  GLUCOSE 130* 109* 145*  BUN 84* 85* 74*  CREATININE 5.34* 5.46* 5.35*  CALCIUM 7.0* 6.8* 6.6*  PHOS  --  7.5*  --     GFR: Estimated Creatinine Clearance: 9 mL/min (A) (by C-G formula based on SCr of 5.35 mg/dL (H)). Recent Labs  Lab 07/05/2021 1747 07/22/2021 1834 07/17/21 0953 07/17/21 1531 07/18/21 0322  PROCALCITON  --   --   --  27.95 28.90  WBC 7.2  --  10.1  --  14.6*  LATICACIDVEN 4.3* 2.4*  --   --   --     Liver Function Tests: Recent Labs  Lab 07/11/2021 1747 07/17/21 0953 07/18/21 0322  AST 29  --  28  ALT 11  --  10  ALKPHOS 108  --  97  BILITOT 0.8  --  0.9  PROT 5.7*  --  5.1*  ALBUMIN 3.1* 2.7* 2.4*   No results for input(s): LIPASE, AMYLASE in the last 168 hours. No results for input(s): AMMONIA in the last 168 hours.  ABG    Component Value Date/Time   PHART 7.450 07/18/2021 0330   PCO2ART 20.0 (L) 07/18/2021 0330   PO2ART 88.4 07/18/2021 0330   HCO3 13.8 (L) 07/18/2021 0330   TCO2 29.2 08/17/2012 2330   ACIDBASEDEF 8.5 (H) 07/18/2021 0330   O2SAT 97.9 07/18/2021 0330     Coagulation Profile: Recent Labs  Lab 07/23/2021 1747 07/17/21 0953  INR 1.3* 1.2    Cardiac Enzymes: No results for input(s): CKTOTAL, CKMB, CKMBINDEX, TROPONINI in the last 168 hours.  HbA1C: Hemoglobin A1C  Date/Time Value Ref Range Status  04/05/2021 02:15 PM 5.4 4.0 - 5.6 % Final  03/27/2020 03:15 PM 5.2 4.0 - 5.6 % Final   Hgb A1c MFr Bld  Date/Time Value Ref Range Status  04/26/2019 09:10 PM 5.0 4.8 - 5.6 % Final    Comment:    (NOTE) Pre diabetes:          5.7%-6.4% Diabetes:              >6.4% Glycemic control for   <7.0% adults with diabetes   06/27/2014 05:25 PM 9.7 (H) <5.7 % Final    Comment:    (NOTE)  According to the ADA Clinical Practice Recommendations for 2011, when HbA1c is used as a screening test:  >=6.5%   Diagnostic of Diabetes Mellitus           (if abnormal result is confirmed) 5.7-6.4%   Increased risk of developing Diabetes Mellitus References:Diagnosis and Classification of  Diabetes Mellitus,Diabetes PFRH,3125,08(LVXBO 1):S62-S69 and Standards of Medical Care in         Diabetes - 2011,Diabetes ZWRK,4753,39 (Suppl 1):S11-S61.    CBG: Recent Labs  Lab 07/17/21 1154 07/17/21 1552 07/17/21 1939 07/17/21 2335 07/18/21 0301  GLUCAP 108* 152* 116* 146* 144*   Critical care time: 35 min       Kennieth Rad, ACNP [facility] 07/18/2021, 7:13 AM  See Amion for pager If no response to pager, please call PCCM consult pager After 7:00 pm call Elink

## 2021-07-18 NOTE — Progress Notes (Signed)
Peripherally Inserted Central Catheter Placement  The IV Nurse has discussed with the patient and/or persons authorized to consent for the patient, the purpose of this procedure and the potential benefits and risks involved with this procedure.  The benefits include less needle sticks, lab draws from the catheter, and the patient may be discharged home with the catheter. Risks include, but not limited to, infection, bleeding, blood clot (thrombus formation), and puncture of an artery; nerve damage and irregular heartbeat and possibility to perform a PICC exchange if needed/ordered by physician.  Alternatives to this procedure were also discussed.  Bard Power PICC patient education guide, fact sheet on infection prevention and patient information card has been provided to patient /or left at bedside.    PICC Placement Documentation  PICC Triple Lumen [id number] PICC Right Basilic 34 cm 0 cm (Active)  Indication for Insertion or Continuance of Line Vasoactive infusions 07/18/21 1956  Exposed Catheter (cm) 0 cm 07/18/21 1956  Site Assessment Clean;Dry;Intact 07/18/21 1956  Lumen #1 Status Flushed;Blood return noted;Saline locked 07/18/21 1956  Lumen #2 Status Flushed;Blood return noted;Saline locked 07/18/21 1956  Lumen #3 Status Flushed;Blood return noted;Saline locked 07/18/21 1956  Dressing Type Transparent 07/18/21 1956  Dressing Status Clean;Dry;Intact 07/18/21 1956  Antimicrobial disc in place? Yes 07/18/21 1956  Dressing Change Due Aug 01, 2021 07/18/21 1956       Scotty Court 07/18/2021, 7:57 PM

## 2021-07-18 NOTE — TOC Initial Note (Signed)
Transition of Care [facility]) - Initial/Assessment Note    Patient Details  Name: Kayla [name] MRN: [id number] Date of Birth: [birth year]  Transition of Care [facility]) CM/SW Contact:    Trish Mage, LCSW Phone Number: 07/18/2021, 8:11 AM  Clinical Narrative:   CSW responding to consult for [facility] POA.  Consult in error.  This is a referral to the chaplain department. TOC will continue to follow during the course of hospitalization.                 Expected Discharge Plan: Home/Self Care Barriers to Discharge: No Barriers Identified   Patient Goals and CMS Choice        Expected Discharge Plan and Services Expected Discharge Plan: Home/Self Care                                              Prior Living Arrangements/Services                       Activities of Daily Living Home Assistive Devices/Equipment: None ADL Screening (condition at time of admission) Patient's cognitive ability adequate to safely complete daily activities?: Yes Is the patient deaf or have difficulty hearing?: No Does the patient have difficulty seeing, even when wearing glasses/contacts?: No Does the patient have difficulty concentrating, remembering, or making decisions?: No Patient able to express need for assistance with ADLs?: No Does the patient have difficulty dressing or bathing?: No Independently performs ADLs?: Yes (appropriate for developmental age) Does the patient have difficulty walking or climbing stairs?: No Weakness of Legs: None Weakness of Arms/Hands: None  Permission Sought/Granted                  Emotional Assessment              Admission diagnosis:  Metabolic acidosis 123XX123 Anemia [D64.9] Myxedema coma ([place]) [E03.5] Anemia, unspecified type [D64.9] Renal failure, unspecified chronicity [N19] Patient Active Problem List   Diagnosis Date Noted   Pressure injury of skin 07/17/2021   Acute respiratory failure with hypoxia ([place])    Renal failure     Anemia 07/07/2021   Counseling regarding goals of care 04/06/2021   Symptomatic anemia 03/17/2021   CKD (chronic kidney disease) stage 5, GFR less than 15 ml/min ([place]) 03/16/2021   Iron deficiency anemia 07/03/2019   Anorexia    Hypercalcemia 11/28/2015   Elevated alkaline phosphatase level 06/27/2014   Unspecified vitamin D deficiency 04/20/2013   Essential hypertension, benign 02/15/2013   Healthcare maintenance 08/17/2012   Fibromyalgia syndrome 07/21/2012   Hearing loss 02/20/2011   HLD (hyperlipidemia) 03/30/2007   Hypothyroidism, postradioiodine therapy 10/13/2006   DM type 2, uncontrolled, with severe neuropathy 07/31/2006   SICKLE-CELL TRAIT 07/31/2006   Depression 07/31/2006   SHOULDER PAIN 07/31/2006   PCP:  Wayland Denis, MD Pharmacy:   [facility] [place] [address] Phone: [phone] Fax: [place] [address] [place] [address] Phone: [phone] Fax: [phone]  [facility], [place], [place] [address] [place] [address] Phone: [phone] Fax: [phone]  [place] [address] [place]), [place] - [address] [place] ([place]) [place] [address] Phone: [phone] Fax: [phone]     Social  Determinants of Health (SDOH) Interventions    Readmission Risk Interventions No flowsheet data found.
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
## 2021-07-18 NOTE — Progress Notes (Signed)
Chaplain engaged in a phone visit with Kenidee's daughter, [name].  Chaplain expressed that she cannot complete an Scientist, physiological, Healthcare POA document without Jlyn being coherent, awake and aware of what she is signing.  Chaplain was able to affirm for [name] that because she is An's next-of-kin, medical staff will reach out to her for decisions on her mom's behalf.  Daughter also had questions around getting a financial/possessions POA completed.  Chaplain let her know that she only works with [facility].    Chaplain offered support to [name] and let her know she would check in on her mom when rounding on the unit.  [name] would like Chaplain to offer a prayer over her mom while honoring their spirituality.  Chaplain let [name] know that we would honor what they hold dear and what they may need.  Chaplain offered support, education, and listening.  Chaplain will follow-up.    07/18/21 0900  Clinical Encounter Type  Visited With Family  Visit Type Initial;Social support;Spiritual support

## 2021-07-19 ENCOUNTER — Other Ambulatory Visit: Payer: Self-pay

## 2021-07-19 ENCOUNTER — Encounter: Payer: Self-pay | Admitting: Internal Medicine

## 2021-07-19 LAB — BASIC METABOLIC PANEL
Anion gap: 16 — ABNORMAL HIGH (ref 5–15)
BUN: 85 mg/dL — ABNORMAL HIGH (ref 6–20)
CO2: 16 mmol/L — ABNORMAL LOW (ref 22–32)
Calcium: 6.7 mg/dL — ABNORMAL LOW (ref 8.9–10.3)
Chloride: 104 mmol/L (ref 98–111)
Creatinine, Ser: 5.42 mg/dL — ABNORMAL HIGH (ref 0.44–1.00)
GFR, Estimated: 9 mL/min — ABNORMAL LOW (ref >60)
Glucose, Bld: 181 mg/dL — ABNORMAL HIGH (ref 70–99)
Potassium: 4 mmol/L (ref 3.5–5.1)
Sodium: 136 mmol/L (ref 135–145)

## 2021-07-19 LAB — GLUCOSE, CAPILLARY
Glucose-Capillary: 155 mg/dL — ABNORMAL HIGH (ref 70–99)
Glucose-Capillary: 172 mg/dL — ABNORMAL HIGH (ref 70–99)
Glucose-Capillary: 166 mg/dL — ABNORMAL HIGH (ref 70–99)
Glucose-Capillary: 132 mg/dL — ABNORMAL HIGH (ref 70–99)
Glucose-Capillary: 200 mg/dL — ABNORMAL HIGH (ref 70–99)
Glucose-Capillary: 169 mg/dL — ABNORMAL HIGH (ref 70–99)

## 2021-07-19 LAB — T4, FREE: Free T4: 0.48 ng/dL — ABNORMAL LOW (ref 0.61–1.12)

## 2021-07-19 LAB — D-DIMER, QUANTITATIVE: D-Dimer, Quant: 7.87 ug/mL-FEU — ABNORMAL HIGH (ref 0.00–0.50)

## 2021-07-19 LAB — C-REACTIVE PROTEIN: CRP: 9.4 mg/dL — ABNORMAL HIGH (ref <1.0)

## 2021-07-19 LAB — FERRITIN: Ferritin: 495 ng/mL — ABNORMAL HIGH (ref 11–307)

## 2021-07-19 LAB — LEGIONELLA PNEUMOPHILA SEROGP 1 UR AG: L. pneumophila Serogp 1 Ur Ag: NEGATIVE

## 2021-07-19 LAB — PROCALCITONIN: Procalcitonin: 23.41 ng/mL

## 2021-07-19 LAB — MAGNESIUM
Magnesium: 1.9 mg/dL (ref 1.7–2.4)
Magnesium: 1.8 mg/dL (ref 1.7–2.4)

## 2021-07-19 LAB — PHOSPHORUS
Phosphorus: 7 mg/dL — ABNORMAL HIGH (ref 2.5–4.6)
Phosphorus: 7.6 mg/dL — ABNORMAL HIGH (ref 2.5–4.6)

## 2021-07-19 MED ORDER — AMLODIPINE BESYLATE 10 MG PO TABS
10.0000 mg | ORAL_TABLET | Freq: Every day | ORAL | Status: DC
  Administered 2021-07-19 – 2021-07-23 (×5): 10 mg
  Filled 2021-07-19 (×5): qty 1

## 2021-07-19 MED ORDER — DOCUSATE SODIUM 50 MG/5ML PO LIQD
100.0000 mg | Freq: Two times a day (BID) | ORAL | Status: DC
  Administered 2021-07-19 – 2021-07-22 (×4): 100 mg
  Filled 2021-07-19 (×7): qty 10

## 2021-07-19 MED ORDER — ALBUTEROL SULFATE (2.5 MG/3ML) 0.083% IN NEBU
2.5000 mg | INHALATION_SOLUTION | RESPIRATORY_TRACT | Status: DC | PRN
  Administered 2021-07-19: 2.5 mg via RESPIRATORY_TRACT
  Filled 2021-07-19: qty 3

## 2021-07-19 MED ORDER — POLYETHYLENE GLYCOL 3350 17 G PO PACK
17.0000 g | PACK | Freq: Every day | ORAL | Status: DC
  Administered 2021-07-20 – 2021-07-22 (×2): 17 g
  Filled 2021-07-19 (×3): qty 1

## 2021-07-19 MED ORDER — DEXMEDETOMIDINE HCL IN NACL 200 MCG/50ML IV SOLN
0.0000 ug/kg/h | INTRAVENOUS | Status: DC
  Administered 2021-07-19: 0.4 ug/kg/h via INTRAVENOUS
  Administered 2021-07-19: 0.5 ug/kg/h via INTRAVENOUS
  Administered 2021-07-20: 0.8 ug/kg/h via INTRAVENOUS
  Administered 2021-07-20: 0.7 ug/kg/h via INTRAVENOUS
  Administered 2021-07-20 – 2021-07-21 (×5): 0.8 ug/kg/h via INTRAVENOUS
  Administered 2021-07-21: 0.9 ug/kg/h via INTRAVENOUS
  Administered 2021-07-21 – 2021-07-22 (×4): 0.8 ug/kg/h via INTRAVENOUS
  Filled 2021-07-19 (×10): qty 50
  Filled 2021-07-19: qty 100
  Filled 2021-07-19 (×3): qty 50

## 2021-07-19 MED ORDER — AMLODIPINE BESYLATE 10 MG PO TABS: 10.0000 mg | ORAL_TABLET | Freq: Every day | ORAL | Status: DC

## 2021-07-19 NOTE — Progress Notes (Signed)
NAME:  Caelan Atchley, MRN:  [id number], DOB:  1961/04/01, LOS: 3 ADMISSION DATE:  07/20/2021, CONSULTATION DATE:  07/24/2021 REFERRING MD:  Darl Householder, ED  CHIEF COMPLAINT:  altered, short of breath   History of Present Illness:  60 y/o F with hx of HTN, HLD, fibromyalgia, graves disease, DM 2, and CKD 5 presenting 10/17 with one day history of altered mental status, found to be hypoxic, hypothermic, anemic with Hgb 3, lactic acidosis, and hypertensive.  Requiring intubation in ER, started on broad spectrum antibiotics.  Some concern for UGIB, started on PPI gtt.  Stool hemoccult negative.  CXR showing bilateral lower lung airspace disease, L>R, and found to be COVID positive.   Of note, she had a PCP visit 07/13/21 for worsening mobility over a few months.  Unable to refill medications.   Chronic pain.    Pertinent  Medical History  Arthritis Hx of cellulitis COPD CKD 5- not on dialysis Deafness (r ear) DM  Peripheral neuropathy Fibromyalgia Hld HTN Hypothyroidism Graves dz hx-> hypothyroidism (untreated) Chronic met acidosis?   Significant Hospital Events: Including procedures, antibiotic start and stop dates in addition to other pertinent events   10/17 Admitted with pneumonia, sepsis, anemia, and possible myedema coma- intubated, transfused 5 units PRBC, COVID +, MRSA PCR +.  Empiric vanco/cefepime/flagyl.  Remdesivir.  10/18  Agitated on WUA, Hgb stable, changed to limited code, no CPR.  TTE with LVEF 50%, severe LVH, G2DD, LVH + RVH with bullseye LV longitudinal strain pattern concerning for cardiac amyloidosis.  Changed to rocephin / azithromycin.  10/19 Bite block placed for clenching teeth with protruding tongue starting to swell, agitated  Interim History / Subjective:  RN reports no acute events overnight. Pt agitated upon waking. Son at bedside, indicates she will pull tube if given opportunity.  WUA in progress Afebrile  Vent - PEEP 8/40%, peak 20's, plat 188  Objective    Blood pressure (!) 148/67, pulse (!) 57, temperature (!) 97.5 F (36.4 C), temperature source Core, resp. rate 17, height _0  (1.575 m), weight 57.8 kg, last menstrual period 01/12/2014, SpO2 92 %.    Vent Mode: PRVC FiO2 (%):  [40 %] 40 % Set Rate:  [15 bmp-20 bmp] 15 bmp Vt Set:  [400 mL] 400 mL PEEP:  [8 cmH20] 8 cmH20 Plateau Pressure:  [15 cmH20-21 cmH20] 18 cmH20   Intake/Output Summary (Last 24 hours) at 07/19/2021 5102 Last data filed at 07/19/2021 0500 Gross per 24 hour  Intake 674.52 ml  Output 440 ml  Net 234.52 ml   Filed Weights   07/11/2021 2200  Weight: 57.8 kg    Examination:   General: chronically ill appearing adult female lying in bed in NAD on vent, son at bedside HEENT: MM pink/moist, ETT, anicteric, prominent exophthalmus  Neuro: sedate on propofol + fentanyl  CV: s1s2 RRR, SR on monitor, no m/r/g PULM: non-labored on vent, lungs bilaterally coarse, no wheezing or rhonchi GI: soft, bsx4 active  Extremities: warm/dry, 1+ pedal edema  Skin: no rashes or lesions  Resolved Hospital Problem list     Assessment & Plan:   Decompensated Hypothyroidism Hx of untreated hypothyroidism after Graves presenting with hypothermia, bradycardia, and AMS.  She does have other reasons to have these symptoms given hypoxic respiratory failure, pneumonia, and COVID.  Cortisol >100.  TSH 140, Free T4 <0.25.  -continue IV Synthroid  -ensure euthermia, euglycemia  -tele monitoring  -treat underlying infectious process  Severe Acute on Chronic Normocytic Anemia Initial concern for  possible UGIB, doubtful, OGT output bilious, stool hemoccult - negative. S/p 5 units PRBC 10/17, no evidence of bleeding  -follow H/H -consider anemia work up once over acute illness, supportive care in the interim   Acute Hypoxic Respiratory Failure with bibasilar consolidations, possibly viral PNA COVID Positive  -low Vt ventilation 4-8cc/kg -goal plateau pressure <30, driving pressure <17  cm H2O -target PaO2 55-65, titrate PEEP/FiO2 per ARDS protocol  -if P/F ratio <150, consider prone therapy for 16 hours per day -goal CVP <4, diuresis as necessary -VAP prevention measures  -follow intermittent CXR  -solumdedrol IV QD, taper to off pending O2 response / after 5 days -remdesivir   -follow inflammatory markers, note defervescence  -WUA / SBT if able  -CAP coverage empirically with rocephin + azithromycin    Acute Metabolic Encephalopathy  In setting of hypothyroidism, hypoxia  -PAD protocol for sedation -RASS goal 0 to -1  -continue low dose enteral oxycodone + klonopin   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  Daughter (works in hospice) agreeable  -Trend BMP / urinary output, assess BMP 10/20 -Replace electrolytes as indicated -Avoid nephrotoxic agents, ensure adequate renal perfusion  Sepsis  Suspect in setting of COVID, profound anemia, possible CAP / viral PNA Strep pneumo negative.  -follow cultures to maturity, note 1/4 blood cultures with GPR in aerobic bottle  -follow PCT  -continue abx for CAP coverage  Thrombocytopenia -follow CBC  -no indication for acute transfusion at this time   DTP Coccyx and right heel, present on admit -pressure relieving measures -appreciate WOC input   Hx DM, euglycemia thus far HgbA1C was 5.4 in 03/2021 -SSI, sensitive scale given steroid administration   HTN -resume home amlodipine -hold home hydralazine for now   LVH and RVH  With bullseye LV longitudinal strain pattern on TTE, concerning for possible cardiac amyloidosis -will need outpatient work up with cardiac MRI   Best Practice (right click and "Reselect all SmartList Selections" daily)  Diet/type: NPO; TF DVT prophylaxis: prophylactic heparin  GI prophylaxis: PPI Lines: N/A - few PIV sites, difficult venous  access for blood draws, will order PICC, cleared with Nephrology given no plans for HD Foley:  Yes, and it is still needed Code Status:  limited- no CPR Last date of multidisciplinary goals of care discussion: Son updated 10/20 at bedside.    Critical care time: 85 minutes     Noe Gens, MSN, APRN, NP-C, AGACNP-BC [facility] 07/19/2021, 7:22 AM   Please see [email or web address] for pager details.   From 7A-7P if no response, please call [phone] After hours, please call ELink [phone]

## 2021-07-19 NOTE — TOC Initial Note (Addendum)
Transition of Care [facility]) - Initial/Assessment Note    Patient Details  Name: Kayla [name] MRN: [id number] Date of Birth: March 21, 1961  [facility]) CM/SW Contact:    Dessa Phi, RN Phone Number: 07/19/2021, 9:51 AM  Clinical Narrative: On vent. VM full for dtr [name]. Spoke to [employer] dtr-she states she has already spoken to nsg about HCPOA-will await until patient is able to respond for Chaplain to asst w/HCPOA. No health insurance-Financial navigators to follow for resources.Will provide Health insurance resources.Continue to monitor d/c plans.                  Expected Discharge Plan: Home/Self Care Barriers to Discharge: Continued Medical Work up   Patient Goals and CMS Choice Patient states their goals for this hospitalization and ongoing recovery are:: home CMS Medicare.gov Compare Post Acute Care list provided to:: Patient Represenative (must comment) (attemepted to contact dtr [name] [phone]) Choice offered to / list presented to : Adult Children  Expected Discharge Plan and Services Expected Discharge Plan: Home/Self Care   Discharge Planning Services: CM Consult Post Acute Care Choice:  (TBD)                                        Prior Living Arrangements/Services   Lives with:: Adult Children Patient language and need for interpreter reviewed:: Yes Do you feel safe going back to the place where you live?: Yes      Need for Family Participation in Patient Care: No (Comment) Care giver support system in place?: Yes (comment)   Criminal Activity/Legal Involvement Pertinent to Current Situation/Hospitalization: No - Comment as needed  Activities of Daily Living Home Assistive Devices/Equipment: None ADL Screening (condition at time of admission) Patient's cognitive ability adequate to safely complete daily activities?: Yes Is the patient deaf or have difficulty hearing?: No Does the patient have difficulty seeing, even when  wearing glasses/contacts?: No Does the patient have difficulty concentrating, remembering, or making decisions?: No Patient able to express need for assistance with ADLs?: No Does the patient have difficulty dressing or bathing?: No Independently performs ADLs?: Yes (appropriate for developmental age) Does the patient have difficulty walking or climbing stairs?: No Weakness of Legs: None Weakness of Arms/Hands: None  Permission Sought/Granted Permission sought to share information with : Case Manager Permission granted to share information with : Yes, Verbal Permission Granted  Share Information with NAME: Case Manager     Permission granted to share info w Relationship: [name] dtr [phone]     Emotional Assessment Appearance:: Appears stated age Attitude/Demeanor/Rapport: Sedated Affect (typically observed): Unable to Assess Orientation: : Oriented to Self Alcohol / Substance Use: Not Applicable Psych Involvement: No (comment)  Admission diagnosis:  Metabolic acidosis 123XX123 Anemia [D64.9] 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] Anemia, unspecified type [D64.9] Renal failure, unspecified chronicity [N19] Patient Active Problem List   Diagnosis Date Noted   COVID-19 virus infection    Pressure injury of skin 07/17/2021   Acute respiratory failure with hypoxia ([place])    Renal failure    Anemia 06/30/2021   Counseling regarding goals of care 04/06/2021   Symptomatic anemia 03/17/2021   CKD (chronic kidney disease) stage 5, GFR less than 15 ml/min (HCC) 03/16/2021   Iron deficiency anemia 07/03/2019   Anorexia    Hypercalcemia 11/28/2015   Elevated alkaline phosphatase level 06/27/2014   Unspecified vitamin D deficiency 04/20/2013  Essential hypertension, benign 02/15/2013   Healthcare maintenance 08/17/2012   Fibromyalgia syndrome 07/21/2012   Hearing loss 02/20/2011   HLD (hyperlipidemia) 03/30/2007   Hypothyroidism, postradioiodine therapy 10/13/2006   DM type 2,  uncontrolled, with severe neuropathy 07/31/2006   SICKLE-CELL TRAIT 07/31/2006   Depression 07/31/2006   SHOULDER PAIN 07/31/2006   PCP:  Wayland Denis, MD Pharmacy:   [facility] [place] [address] Phone: [phone] Fax: [place] [address] [place] [address] Phone: [phone] Fax: [phone]  [facility], [place], [place] [address] [place] [address] Phone: [phone] Fax: [phone]  [place] ([place]), [place] - [address] [place] ([place]) [place] [address] Phone: [phone] Fax: [phone]     Social Determinants of Health (SDOH) Interventions    Readmission Risk Interventions No flowsheet data found.

## 2021-07-19 NOTE — Plan of Care (Signed)
  Problem: Clinical Measurements: Goal: Will remain free from infection Outcome: Progressing   Problem: Clinical Measurements: Goal: Diagnostic test results will improve Outcome: Progressing   Problem: Clinical Measurements: Goal: Cardiovascular complication will be avoided Outcome: Progressing   Problem: Elimination: Goal: Will not experience complications related to bowel motility Outcome: Progressing   Problem: Elimination: Goal: Will not experience complications related to urinary retention Outcome: Progressing   Problem: Respiratory: Goal: Levels of oxygenation will improve Outcome: Progressing

## 2021-07-19 NOTE — Progress Notes (Signed)
[facility] Progress Note  Subjective: UOP 300- 500 cc /d, creat stable  Vitals:   07/19/21 1100 07/19/21 1200 07/19/21 1226 07/19/21 1300  BP: 129/63 (!) 166/66  (!) 141/65  Pulse: (!) 57 (!) 59  (!) 59  Resp: _0 Temp:      TempSrc:      SpO2: 99% 100% 100% 98%  Weight:      Height:        Exam: on vent ,sedated  no jvd  throat ett in place  Chest cta bilat and lat  Cor reg no RG  Abd soft ntnd no ascites   Ext 1+ LE/ UE edema   Neuro on vent and sedated       Date                          Creat               egFR   2007- 2013                0.65- 1.27   2015- 2017                1.18- 2.35   2018                          1.84- 2.54   2019                          3.02- 3.44   Mar -may 2020          3.6- 4.6   July -aug 2020          6.09 >> 5.04    AKI episode   Jan 2021                   4.15 March 2020                  6.39   June 2022                 6.84- 7.12        6- 7 ml/min                                             April 05 2021                5.42                    Oct 17                       5.34                 9   Jul 17, 2021             5.46                 8 ml/min   Home meds include - norvasc, hydralazine, nsaid, dilaudid po prn, sod bicarb 1.3gm bid, topamax, venlafaxine xr, insulin glargine, prns and vitamins   UA 10/17 - prot > 300, 20-50 wbc, 0-5 rbc   CXR - bilat lower lobe airspace disease     Assessment/ Plan:  CKD V - b/l creat from June 2022 is 6.8- 7.1.  Creat here 5.3,  eGFR here is 8-9 ml/min, in setting of AMS, hypothermia, probable UTI, myxedema coma and bibasilar PNA w/ sepsis. Creat stable , making some urine and creat stable in mid 5's.  Do not recommend dialysis for this patient (see prior notes). Also patient does not want dialysis. No other suggestions at this time, will sign off.  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        Kayla [name] 07/19/2021, 2:53 PM   Recent Labs  Lab 07/17/21 0953 07/18/21 0322 07/18/21 1850 07/19/21 0254 07/19/21 1038  K 4.3 4.3  --   --  4.0  BUN 85* 74*  --   --  85*  CREATININE 5.46* 5.35*  --   --  5.42*  CALCIUM 6.8* 6.6*  --   --  6.7*  PHOS 7.5*  --  7.0* 7.0*  --   HGB 12.6 12.0  --   --   --     Inpatient medications:  albuterol  2.5 mg Nebulization Q6H   amLODipine  10 mg Per Tube Daily   artificial tears   Both Eyes Q8H   chlorhexidine gluconate (MEDLINE KIT)  15 mL Mouth Rinse BID   Chlorhexidine Gluconate Cloth  6 each Topical Daily   clonazepam  0.25 mg Per Tube Q8H   docusate  100 mg Per Tube BID   heparin injection (subcutaneous)  5,000 Units Subcutaneous Q8H   insulin aspart  0-15 Units Subcutaneous Q4H   levothyroxine  50 mcg Intravenous Daily   mouth rinse  15 mL Mouth Rinse 10 times per day   methylPREDNISolone (SOLU-MEDROL) injection  57 mg Intravenous Daily   oxyCODONE  5 mg Per Tube BID   pantoprazole (PROTONIX) IV  40 mg Intravenous Q24H   polyethylene glycol  17 g Per Tube Daily   sodium chloride flush  10-40 mL Intracatheter Q12H    azithromycin Stopped (07/18/21 1719)   cefTRIAXone (ROCEPHIN)  IV Stopped (07/18/21 1825)   dexmedetomidine (PRECEDEX) IV infusion 0.5 mcg/kg/hr (07/19/21 1358)   feeding supplement (VITAL HIGH PROTEIN) 45 mL/hr at 07/19/21 1314   fentaNYL infusion INTRAVENOUS 100 mcg/hr (07/19/21 1358)   remdesivir 100 mg in NS 100 mL Stopped (07/19/21 1054)   fentaNYL, sodium chloride flush

## 2021-07-20 ENCOUNTER — Inpatient Hospital Stay (HOSPITAL_COMMUNITY): Payer: Self-pay

## 2021-07-20 DIAGNOSIS — E872 Acidosis, unspecified: Secondary | ICD-10-CM

## 2021-07-20 DIAGNOSIS — J9621 Acute and chronic respiratory failure with hypoxia: Secondary | ICD-10-CM

## 2021-07-20 DIAGNOSIS — J9622 Acute and chronic respiratory failure with hypercapnia: Secondary | ICD-10-CM

## 2021-07-20 LAB — BPAM RBC
Blood Product Expiration Date: 202210222359
Blood Product Expiration Date: 202210232359
Blood Product Expiration Date: 202211122359
Blood Product Expiration Date: 202211162359
Blood Product Expiration Date: 202211162359
Blood Product Expiration Date: 202211212359
ISSUE DATE / TIME: 202210171833
ISSUE DATE / TIME: 202210171915
ISSUE DATE / TIME: 202210171943
ISSUE DATE / TIME: 202210172252
ISSUE DATE / TIME: 202210180328
Unit Type and Rh: 5100
Unit Type and Rh: 5100
Unit Type and Rh: 5100
Unit Type and Rh: 9500
Unit Type and Rh: 9500
Unit Type and Rh: 9500

## 2021-07-20 LAB — CULTURE, RESPIRATORY W GRAM STAIN: Culture: NORMAL

## 2021-07-20 LAB — CBC
HCT: 31.6 % — ABNORMAL LOW (ref 36.0–46.0)
Hemoglobin: 11.2 g/dL — ABNORMAL LOW (ref 12.0–15.0)
MCH: 31 pg (ref 26.0–34.0)
MCHC: 35.4 g/dL (ref 30.0–36.0)
MCV: 87.5 fL (ref 80.0–100.0)
Platelets: 70 10*3/uL — ABNORMAL LOW (ref 150–400)
RBC: 3.61 MIL/uL — ABNORMAL LOW (ref 3.87–5.11)
RDW: 17.1 % — ABNORMAL HIGH (ref 11.5–15.5)
WBC: 18.1 10*3/uL — ABNORMAL HIGH (ref 4.0–10.5)
nRBC: 0.4 % — ABNORMAL HIGH (ref 0.0–0.2)

## 2021-07-20 LAB — TYPE AND SCREEN
ABO/RH(D): O POS
Antibody Screen: POSITIVE
Donor AG Type: NEGATIVE
Donor AG Type: NEGATIVE
PT AG Type: NEGATIVE
Unit division: 0
Unit division: 0
Unit division: 0
Unit division: 0
Unit division: 0
Unit division: 0

## 2021-07-20 LAB — COMPREHENSIVE METABOLIC PANEL
ALT: 10 U/L (ref 0–44)
AST: 24 U/L (ref 15–41)
Albumin: 2.6 g/dL — ABNORMAL LOW (ref 3.5–5.0)
Alkaline Phosphatase: 94 U/L (ref 38–126)
Anion gap: 16 — ABNORMAL HIGH (ref 5–15)
BUN: 87 mg/dL — ABNORMAL HIGH (ref 6–20)
CO2: 17 mmol/L — ABNORMAL LOW (ref 22–32)
Calcium: 6.9 mg/dL — ABNORMAL LOW (ref 8.9–10.3)
Chloride: 103 mmol/L (ref 98–111)
Creatinine, Ser: 5.17 mg/dL — ABNORMAL HIGH (ref 0.44–1.00)
GFR, Estimated: 9 mL/min — ABNORMAL LOW (ref >60)
Glucose, Bld: 194 mg/dL — ABNORMAL HIGH (ref 70–99)
Potassium: 3.8 mmol/L (ref 3.5–5.1)
Sodium: 136 mmol/L (ref 135–145)
Total Bilirubin: 0.9 mg/dL (ref 0.3–1.2)
Total Protein: 5.4 g/dL — ABNORMAL LOW (ref 6.5–8.1)

## 2021-07-20 LAB — T3: T3, Total: 23 ng/dL — ABNORMAL LOW (ref 71–180)

## 2021-07-20 LAB — D-DIMER, QUANTITATIVE: D-Dimer, Quant: 6.67 ug/mL-FEU — ABNORMAL HIGH (ref 0.00–0.50)

## 2021-07-20 LAB — GLUCOSE, CAPILLARY
Glucose-Capillary: 120 mg/dL — ABNORMAL HIGH (ref 70–99)
Glucose-Capillary: 146 mg/dL — ABNORMAL HIGH (ref 70–99)
Glucose-Capillary: 155 mg/dL — ABNORMAL HIGH (ref 70–99)
Glucose-Capillary: 156 mg/dL — ABNORMAL HIGH (ref 70–99)
Glucose-Capillary: 171 mg/dL — ABNORMAL HIGH (ref 70–99)
Glucose-Capillary: 178 mg/dL — ABNORMAL HIGH (ref 70–99)

## 2021-07-20 LAB — FERRITIN: Ferritin: 531 ng/mL — ABNORMAL HIGH (ref 11–307)

## 2021-07-20 LAB — C-REACTIVE PROTEIN: CRP: 13.2 mg/dL — ABNORMAL HIGH (ref <1.0)

## 2021-07-20 LAB — MAGNESIUM: Magnesium: 1.7 mg/dL (ref 1.7–2.4)

## 2021-07-20 LAB — PHOSPHORUS: Phosphorus: 7.3 mg/dL — ABNORMAL HIGH (ref 2.5–4.6)

## 2021-07-20 MED ORDER — ALBUTEROL SULFATE (2.5 MG/3ML) 0.083% IN NEBU
2.5000 mg | INHALATION_SOLUTION | Freq: Three times a day (TID) | RESPIRATORY_TRACT | Status: DC
  Administered 2021-07-21 – 2021-07-23 (×7): 2.5 mg via RESPIRATORY_TRACT
  Filled 2021-07-20 (×7): qty 3

## 2021-07-20 MED ORDER — SODIUM CHLORIDE 0.9 % IV SOLN
500.0000 mg | INTRAVENOUS | Status: DC
  Administered 2021-07-20: 500 mg via INTRAVENOUS
  Filled 2021-07-20: qty 500

## 2021-07-20 MED ORDER — MAGNESIUM SULFATE 2 GM/50ML IV SOLN
2.0000 g | Freq: Once | INTRAVENOUS | Status: AC
  Administered 2021-07-20: 2 g via INTRAVENOUS
  Filled 2021-07-20: qty 50

## 2021-07-20 MED ORDER — FUROSEMIDE 10 MG/ML IJ SOLN
80.0000 mg | Freq: Three times a day (TID) | INTRAMUSCULAR | Status: DC
  Administered 2021-07-20 (×2): 80 mg via INTRAVENOUS
  Filled 2021-07-20 (×2): qty 8

## 2021-07-20 NOTE — Progress Notes (Addendum)
I spoke with Dr. Tessa Lerner regarding patient fluid status. CXR with pulmonary edema ,10 L positive since admission , creatinine of 5.17. He concurred that Lasix is the best option to try and remove fluid to help liberate from MV. I have ordered Lasix 80 mg TID today. Per Dr. Tessa Lerner, we can go to 100 mg or 120 mg if needed for fluid response .  Magdalen Spatz, MSN, AGACNP-BC [place] for personal pager PCCM on call pager [phone]  07/20/2021 1:52 PM

## 2021-07-20 NOTE — Progress Notes (Addendum)
NAME:  Kayla [name], MRN:  [id number], DOB:  1961/01/19, LOS: 4 ADMISSION DATE:  07/28/2021, CONSULTATION DATE:  07/13/2021 REFERRING MD:  Darl Householder, ED  CHIEF COMPLAINT:  altered, short of breath   History of Present Illness:  60 y/o F with hx of HTN, HLD, fibromyalgia, graves disease, DM 2, and CKD 5 presenting 10/17 with one day history of altered mental status, found to be hypoxic, hypothermic, anemic with Hgb 3, lactic acidosis, and hypertensive.  Requiring intubation in ER, started on broad spectrum antibiotics.  Some concern for UGIB, started on PPI gtt.  Stool hemoccult negative.  CXR showing bilateral lower lung airspace disease, L>R, and found to be COVID positive.   Of note, she had a PCP visit 07/13/21 for worsening mobility over a few months.  Unable to refill medications.   Chronic pain.    Pertinent  Medical History  Arthritis Hx of cellulitis COPD CKD 5- not on dialysis Deafness (r ear) DM  Peripheral neuropathy Fibromyalgia Hld HTN Hypothyroidism Graves dz hx-> hypothyroidism (untreated) Chronic met acidosis?   Significant Hospital Events: Including procedures, antibiotic start and stop dates in addition to other pertinent events   10/17 Admitted with pneumonia, sepsis, anemia, and possible myedema coma- intubated, transfused 5 units PRBC, COVID +, MRSA PCR +.  Empiric vanco/cefepime/flagyl.  Remdesivir.  10/18  Agitated on WUA, Hgb stable, changed to limited code, no CPR.  TTE with LVEF 50%, severe LVH, G2DD, LVH + RVH with bullseye LV longitudinal strain pattern concerning for cardiac amyloidosis.  Changed to rocephin / azithromycin.  10/19 Bite block placed for clenching teeth with protruding tongue starting to swell, agitated - Continued issues with agitation whenever sedation weaned. Failed SBT  Interim History / Subjective:  RN reports no acute events overnight. Pt remains agitated upon waking. Son remains at bedside Remains on Fentanyl 100 and precedex at 0.7 Net  + 10 L, 611 cc's urine output last 24 hours Afebrile  Vent - PEEP 8/40%, peak 20's, failed SBT  Creatinine is 5.17 on 10/21 ( 5.42 on 10/20) Na 136/ K 3.8/ Phos 7.3, Ferritin 531/ CRP 13.2 WBC 18.1 ( 14.6 on 10/20)/ HGB 11.2/ PLTS 70 K ( 79 on 10/20) Objective   Blood pressure (!) 150/70, pulse 70, temperature 98.6 F (37 C), temperature source Bladder, resp. rate 15, height '5\' 2"'  (1.575 m), weight 66.3 kg, last menstrual period 01/12/2014, SpO2 95 %.    Vent Mode: PRVC FiO2 (%):  [30 %-40 %] 30 % Set Rate:  [15 bmp] 15 bmp Vt Set:  [400 mL] 400 mL PEEP:  [8 cmH20] 8 cmH20 Plateau Pressure:  [21 cmH20-25 cmH20] 25 cmH20   Intake/Output Summary (Last 24 hours) at 07/20/2021 1141 Last data filed at 07/20/2021 0945 Gross per 24 hour  Intake 1840.48 ml  Output 723 ml  Net 1117.48 ml   Filed Weights   07/12/2021 2200 07/20/21 0500  Weight: 57.8 kg 66.3 kg    Examination:   General: chronically ill appearing adult female lying in bed in NAD on vent, sedated, son at bedside HEENT: MM pink/moist, ETT, OG tube, anicteric, prominent exophthalmus  Neuro: sedated on precedex + fentanyl  CV: s1s2 RRR, SR on monitor, no m/r/g PULM: Bilateral chest excursion, non-labored on vent, lungs bilaterally coarse, no wheezing or rhonchi GI: soft, bsx4 active , slightly distended Extremities: warm/dry, 1+ pedal edema , brisk refill Skin: no rashes or lesions  Resolved Hospital Problem list     Assessment & Plan:   Decompensated  Hypothyroidism Hx of untreated hypothyroidism after Graves presenting with hypothermia, bradycardia, and AMS.  She does have other reasons to have these symptoms given hypoxic respiratory failure, pneumonia, and COVID.  Cortisol >100.  TSH 140, Free T4 <0.25.  -continue IV Synthroid  -ensure euthermia, euglycemia  -tele monitoring  -treat underlying infectious process  Severe Acute on Chronic Normocytic Anemia Initial concern for possible UGIB, doubtful, OGT output  bilious, stool hemoccult - negative. S/p 5 units PRBC 10/17, no evidence of bleeding  -follow H/H -consider anemia work up once over acute illness, supportive care in the interim   Acute Hypoxic Respiratory Failure with bibasilar consolidations, possibly viral PNA COVID Positive  Agitation barrier to de-escalating sedation to allow weaning -low Vt ventilation 4-8cc/kg -goal plateau pressure <30, driving pressure <70 cm H2O -target PaO2 55-65, titrate PEEP/FiO2 per ARDS protocol  -if P/F ratio <150, consider prone therapy for 16 hours per day -goal CVP <4, diuresis as necessary -VAP prevention measures  -follow intermittent CXR  -solumdedrol IV QD, taper to off pending O2 response / after 5 days -remdesivir   -follow inflammatory markers, note defervescence  -WUA / SBT if able  -CAP coverage empirically with rocephin + azithromycin    Acute Metabolic Encephalopathy  In setting of hypothyroidism, hypoxia  -PAD protocol for sedation -RASS goal 0 to -1  -continue low dose enteral oxycodone + klonopin   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  Daughter (works in hospice) agreeable  -Trend BMP / urinary output, assess BMP 10/20 -Replace electrolytes as indicated -Avoid nephrotoxic agents, ensure adequate renal perfusion - If CXR looks wet, volume status is 10 L +, may need to consider lasix vs CVVHD as volume may be impacting ability to wean from ventilator  Sepsis  Suspect in setting of COVID, profound anemia, possible CAP / viral PNA Strep pneumo negative.  -follow cultures to maturity, note 1/4 blood cultures with GPR in aerobic bottle  - follow PCT  - Trend fever curve and WBC - continue abx for CAP coverage - re-culture as is clinically indicated  Thrombocytopenia Continue to down trend 10/21 -follow CBC  -no indication for acute  transfusion at this time   DTP Coccyx and right heel, present on admit -pressure relieving measures -appreciate WOC input   Hx DM, euglycemia thus far HgbA1C was 5.4 in 03/2021 -SSI, sensitive scale given steroid administration   HTN -resume home amlodipine -hold home hydralazine for now   LVH and RVH  With bullseye LV longitudinal strain pattern on TTE, concerning for possible cardiac amyloidosis -will need outpatient work up with cardiac MRI   Best Practice (right click and "Reselect all SmartList Selections" daily)  Diet/type: NPO; TF DVT prophylaxis: prophylactic heparin  GI prophylaxis: PPI Lines: N/A - few PIV sites, difficult venous access for blood draws, will order PICC, cleared with Nephrology given no plans for HD Foley:  Yes, and it is still needed Code Status:  limited- no CPR Last date of multidisciplinary goals of care discussion: Son updated 10/20 at bedside.    Critical care time: 40 minutes     Magdalen Spatz,  MSN, APRN, AGACNP-BC [facility] 07/20/2021, 11:41 AM   Please see [email or web address] for pager details.   From 7A-7P if no response, please call [phone] After hours, please call ELink [phone]

## 2021-07-21 ENCOUNTER — Inpatient Hospital Stay (HOSPITAL_COMMUNITY): Payer: Self-pay

## 2021-07-21 ENCOUNTER — Encounter (HOSPITAL_COMMUNITY): Payer: Self-pay

## 2021-07-21 DIAGNOSIS — L89152 Pressure ulcer of sacral region, stage 2: Secondary | ICD-10-CM

## 2021-07-21 DIAGNOSIS — Z7189 Other specified counseling: Secondary | ICD-10-CM

## 2021-07-21 DIAGNOSIS — Z515 Encounter for palliative care: Secondary | ICD-10-CM

## 2021-07-21 LAB — SAR COV2 SEROLOGY (COVID19)AB(IGG),IA: SARS-CoV-2 Ab, IgG: NONREACTIVE

## 2021-07-21 LAB — GLUCOSE, CAPILLARY
Glucose-Capillary: 107 mg/dL — ABNORMAL HIGH (ref 70–99)
Glucose-Capillary: 112 mg/dL — ABNORMAL HIGH (ref 70–99)
Glucose-Capillary: 125 mg/dL — ABNORMAL HIGH (ref 70–99)
Glucose-Capillary: 133 mg/dL — ABNORMAL HIGH (ref 70–99)
Glucose-Capillary: 175 mg/dL — ABNORMAL HIGH (ref 70–99)
Glucose-Capillary: 184 mg/dL — ABNORMAL HIGH (ref 70–99)

## 2021-07-21 LAB — CBC WITH DIFFERENTIAL/PLATELET
Abs Immature Granulocytes: 1.09 10*3/uL — ABNORMAL HIGH (ref 0.00–0.07)
Basophils Absolute: 0 10*3/uL (ref 0.0–0.1)
Basophils Relative: 0 %
Eosinophils Absolute: 0 10*3/uL (ref 0.0–0.5)
Eosinophils Relative: 0 %
HCT: 30.3 % — ABNORMAL LOW (ref 36.0–46.0)
Hemoglobin: 10.8 g/dL — ABNORMAL LOW (ref 12.0–15.0)
Immature Granulocytes: 8 %
Lymphocytes Relative: 3 %
Lymphs Abs: 0.4 10*3/uL — ABNORMAL LOW (ref 0.7–4.0)
MCH: 30.6 pg (ref 26.0–34.0)
MCHC: 35.6 g/dL (ref 30.0–36.0)
MCV: 85.8 fL (ref 80.0–100.0)
Monocytes Absolute: 0.6 10*3/uL (ref 0.1–1.0)
Monocytes Relative: 4 %
Neutro Abs: 11.7 10*3/uL — ABNORMAL HIGH (ref 1.7–7.7)
Neutrophils Relative %: 85 %
Platelets: 51 10*3/uL — ABNORMAL LOW (ref 150–400)
RBC: 3.53 MIL/uL — ABNORMAL LOW (ref 3.87–5.11)
RDW: 16.8 % — ABNORMAL HIGH (ref 11.5–15.5)
WBC: 13.9 10*3/uL — ABNORMAL HIGH (ref 4.0–10.5)
nRBC: 0.4 % — ABNORMAL HIGH (ref 0.0–0.2)

## 2021-07-21 LAB — FERRITIN: Ferritin: 507 ng/mL — ABNORMAL HIGH (ref 11–307)

## 2021-07-21 LAB — CULTURE, BLOOD (ROUTINE X 2)
Culture: NO GROWTH
Special Requests: ADEQUATE

## 2021-07-21 LAB — BASIC METABOLIC PANEL
Anion gap: 15 (ref 5–15)
BUN: 86 mg/dL — ABNORMAL HIGH (ref 6–20)
CO2: 16 mmol/L — ABNORMAL LOW (ref 22–32)
Calcium: 7.2 mg/dL — ABNORMAL LOW (ref 8.9–10.3)
Chloride: 107 mmol/L (ref 98–111)
Creatinine, Ser: 4.61 mg/dL — ABNORMAL HIGH (ref 0.44–1.00)
GFR, Estimated: 10 mL/min — ABNORMAL LOW (ref >60)
Glucose, Bld: 106 mg/dL — ABNORMAL HIGH (ref 70–99)
Potassium: 3.7 mmol/L (ref 3.5–5.1)
Sodium: 138 mmol/L (ref 135–145)

## 2021-07-21 LAB — TRIGLYCERIDES: Triglycerides: 154 mg/dL — ABNORMAL HIGH (ref <150)

## 2021-07-21 LAB — PROCALCITONIN: Procalcitonin: 15.43 ng/mL

## 2021-07-21 LAB — C-REACTIVE PROTEIN: CRP: 11.8 mg/dL — ABNORMAL HIGH (ref <1.0)

## 2021-07-21 LAB — MAGNESIUM: Magnesium: 1.9 mg/dL (ref 1.7–2.4)

## 2021-07-21 LAB — D-DIMER, QUANTITATIVE: D-Dimer, Quant: 4.56 ug/mL-FEU — ABNORMAL HIGH (ref 0.00–0.50)

## 2021-07-21 MED ORDER — MIDAZOLAM HCL 2 MG/2ML IJ SOLN
1.0000 mg | INTRAMUSCULAR | Status: DC | PRN
  Administered 2021-07-22: 2 mg via INTRAVENOUS
  Administered 2021-07-22: 1 mg via INTRAVENOUS
  Administered 2021-07-22 – 2021-07-23 (×2): 2 mg via INTRAVENOUS
  Administered 2021-07-23: 3 mg via INTRAVENOUS
  Administered 2021-07-24: 1 mg via INTRAVENOUS
  Filled 2021-07-21 (×3): qty 2
  Filled 2021-07-21: qty 4
  Filled 2021-07-21 (×2): qty 2
  Filled 2021-07-21: qty 4

## 2021-07-21 MED ORDER — MAGNESIUM SULFATE 2 GM/50ML IV SOLN
2.0000 g | Freq: Once | INTRAVENOUS | Status: AC
  Administered 2021-07-21: 2 g via INTRAVENOUS
  Filled 2021-07-21: qty 50

## 2021-07-21 MED ORDER — CHLORHEXIDINE GLUCONATE 0.12 % MT SOLN
OROMUCOSAL | Status: AC
  Administered 2021-07-21: 15 mL via OROMUCOSAL
  Filled 2021-07-21: qty 15

## 2021-07-21 MED ORDER — HYDROMORPHONE HCL 1 MG/ML IJ SOLN
1.0000 mg | INTRAMUSCULAR | Status: DC | PRN
  Administered 2021-07-22: 1 mg via INTRAVENOUS
  Administered 2021-07-22 (×2): 3 mg via INTRAVENOUS
  Administered 2021-07-23: 2 mg via INTRAVENOUS
  Administered 2021-07-23: 3 mg via INTRAVENOUS
  Filled 2021-07-21: qty 1
  Filled 2021-07-21 (×2): qty 3
  Filled 2021-07-21: qty 2
  Filled 2021-07-21: qty 3

## 2021-07-21 MED ORDER — FUROSEMIDE 10 MG/ML IJ SOLN
160.0000 mg | Freq: Three times a day (TID) | INTRAVENOUS | Status: AC
  Administered 2021-07-21: 160 mg via INTRAVENOUS
  Filled 2021-07-21: qty 16

## 2021-07-21 MED ORDER — POTASSIUM CHLORIDE 20 MEQ PO PACK
20.0000 meq | PACK | Freq: Once | ORAL | Status: AC
  Administered 2021-07-21: 20 meq
  Filled 2021-07-21: qty 1

## 2021-07-21 MED ORDER — MAGNESIUM SULFATE 2 GM/50ML IV SOLN: 2.0000 g | Freq: Once | INTRAVENOUS | Status: DC

## 2021-07-21 NOTE — Consult Note (Signed)
Consultation Note Date: 07/21/2021   Patient Name: Kayla [name]  DOB: Jan 25, 1961  MRN: [id number]  Age / Sex: 60 y.o., female  PCP: Wayland Denis, MD Referring Physician: Brand Males, MD  Reason for Consultation: Establishing goals of care  HPI/Patient Profile: 60 y.o. female  with past medical history of hypertension, hyperlipidemia, fibromyalgia, Graves' disease, diabetes, CKD stage V admitted on 07/13/2021 with hypoxemic respiratory failure secondary to COVID-19 and severe anemia status post 5 units packed red blood cells.  She remains intubated and has poor functional status at baseline.  Palliative consulted for goals of care.  Clinical Assessment and Goals of Care: Palliative care consult received.  Chart reviewed including personal review of pertinent labs and imaging.  I saw and examined Kayla [name] today.  She is intubated and sedated.  Her daughter, Alvina Chou, is at the bedside.  [name] tells me that family is the most important thing to [name].  This includes her 2 children and her grandchildren.  She is a very spiritual woman but does not prescribe to any particular faith.  She worked as a Statistician.  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  She tells me that she understands the severity of the situation and concern about her mother's poor functional status coupled with the fact she is now ventilator dependent.  At the same time, she tells me that she remains hopeful that she will have some stabilization of function and recovery and may be able to be extubated.  She wants for her mother to be home if she is not going to recover, even if it is for short period of time.  We reviewed multiple comorbidities and unknown of her clinical course moving forward.  I answered questions serenity had  regarding current care plan.  We discussed decline that her mother was noted to have in her nutrition and functional status even prior to this admission and how this further impairs likelihood of her having functional recovery.  We discussed plan to continue current care at this time and reassess her situation again early next week.    SUMMARY OF RECOMMENDATIONS   -No CPR in the event of cardiac arrest. -No dialysis. -Continue current care and plan to reassess her situation again early next week.  Depending on her clinical course, her daughter reports that she, her brother, and her oldest daughter (patient's granddaughter who is 28) would be part of meeting to determine goals of care. -Palliative to continue to follow.  Code Status/Advance Care Planning: Limited code  Prognosis:  Guarded  Discharge Planning: To Be Determined      Primary Diagnoses: Present on Admission:  Anemia   I have reviewed the medical record, interviewed the patient and family, and examined the patient. The following aspects are pertinent.  Past Medical History:  Diagnosis Date   ANA POSITIVE 02/09/2010   Anal warts 11/09/2012   Anxiety    Arthritis    "  my bones ache all the time" (06/27/2014)   CAP (community acquired pneumonia) 04/29/2019   Cellulitis 06/27/2014   medial proximal left thigh extending to the perineium   Chronic bronchitis ([place])    "get it changing of the seasons; sometimes q yr; sometimes not"    Complication of anesthesia    sats drop post op   COPD (chronic obstructive pulmonary disease) ([place])    Deafness in 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"   Diabetic peripheral neuropathy ([place]) since <2005   "hands and feet"   Dizziness    Multiple falls at home   Fibromyalgia    Graves' disease    "they nuked it twice"   Headache(784.0)    "monthly" (06/27/2014)   Heart murmur    as a child-never had echo to evaluate   Hx of Helicobacter infection 07/03/2019   Hyperlipidemia     Hypertension    Hypothyroidism    Insomnia 07/31/2006   Annotation: dx  1999 Qualifier: Diagnosis of  By: Sharlet Salina MD, [place]     Multiple falls 08/17/2012   Pain and swelling of left ankle 08/07/2017   Proteinuria 07/31/2006   Annotation: 24 hour urine 237m / 24 hours   (11/03) Qualifier: Diagnosis of  By: CSharlet SalinaMD, [place]     PUD (peptic ulcer disease) 07/03/2019   Sickle cell trait ([place]    Trigger finger, left middle finger 12/15/2017   Trimalleolar fracture of left ankle 11/21/2014   Type 2 diabetes mellitus ([place]    Urinary urgency 02/26/2019   Vaginal odor 04/08/2021   Weakness 04/26/2019   Wears glasses    Social History   Socioeconomic History   Marital status: Unknown    Spouse name: Not on file   Number of children: 2   Years of education: 12   Highest education level: Not on file  Occupational History    Employer: UNEMPLOYED  Tobacco Use   Smoking status: Former    Packs/day: 0.10    Years: 5.00    Pack years: 0.50    Types: Cigarettes    Quit date: 09/30/1990    Years since quitting: 30.8   Smokeless tobacco: Never   Tobacco comments:    1-2 everyday or so   Vaping Use   Vaping Use: Never used  Substance and Sexual Activity   Alcohol use: No    Alcohol/week: 0.0 standard drinks   Drug use: No   Sexual activity: Not Currently  Other Topics Concern   Not on file  Social History Narrative   Lives in [place] alone. Looking for a job. Has a son in town who checks in on her.    Social Determinants of Health   Financial Resource Strain: High Risk   Difficulty of Paying Living Expenses: Hard  Food Insecurity: No Food Insecurity   Worried About RCharity fundraiserin the Last Year: Never true   Ran Out of Food in the Last Year: Never true  Transportation Needs: Unmet Transportation Needs   Lack of Transportation (Medical): Yes   Lack of Transportation (Non-Medical): Yes  Physical Activity: Not on file  Stress: Not on file  Social Connections: Not on file    Family History  Problem Relation Age of Onset   Emphysema Mother    Cancer Mother        unknown   Hypertension Mother    Cancer Father        pancreatic    Diabetes Father  Hypertension Father    Down syndrome Brother    Cancer Maternal Aunt        cancer   Cancer Other        breast cancer aunt   Stroke Sister    Diabetes Sister    Colon cancer Neg Hx    Stomach cancer Neg Hx    Esophageal cancer Neg Hx    Inflammatory bowel disease Neg Hx    Liver disease Neg Hx    Pancreatic cancer Neg Hx    Rectal cancer Neg Hx    Scheduled Meds:  albuterol  2.5 mg Nebulization TID   amLODipine  10 mg Per Tube Daily   artificial tears   Both Eyes Q8H   chlorhexidine gluconate (MEDLINE KIT)  15 mL Mouth Rinse BID   Chlorhexidine Gluconate Cloth  6 each Topical Daily   docusate  100 mg Per Tube BID   insulin aspart  0-15 Units Subcutaneous Q4H   levothyroxine  50 mcg Intravenous Daily   mouth rinse  15 mL Mouth Rinse 10 times per day   methylPREDNISolone (SOLU-MEDROL) injection  57 mg Intravenous Daily   pantoprazole (PROTONIX) IV  40 mg Intravenous Q24H   polyethylene glycol  17 g Per Tube Daily   sodium chloride flush  10-40 mL Intracatheter Q12H   Continuous Infusions:  cefTRIAXone (ROCEPHIN)  IV Stopped (07/20/21 1811)   dexmedetomidine (PRECEDEX) IV infusion 0.8 mcg/kg/hr (07/21/21 1542)   feeding supplement (VITAL HIGH PROTEIN) 1,000 mL (07/21/21 1511)   fentaNYL infusion INTRAVENOUS 125 mcg/hr (07/21/21 1542)   PRN Meds:.albuterol, fentaNYL, HYDROmorphone (DILAUDID) injection, midazolam, sodium chloride flush Medications Prior to Admission:  Prior to Admission medications   Medication Sig Start Date End Date Taking? Authorizing Provider  acetaminophen (TYLENOL) 325 MG tablet Take 2 tablets (650 mg total) by mouth every 6 (six) hours as needed for mild pain (or Fever >/= 101). 03/19/21  Yes Katsadouros, Vasilios, MD  amLODipine (NORVASC) 10 MG tablet Take 1 tablet (10  mg total) by mouth daily. 07/13/21  Yes Virl Axe, MD  Aspirin-Salicylamide-Caffeine (ARTHRITIS STRENGTH BC POWDER PO) Take 1 application by mouth daily as needed (breakthrough pain).   Yes [provider]  cetaphil (CETAPHIL) lotion Apply 1 application topically as needed for dry skin. 04/05/21  Yes Sanjuan Dame, MD  Darbepoetin Alfa (ARANESP) 60 MCG/0.3ML SOSY injection Inject 0.3 mLs (60 mcg total) into the skin every Saturday at 6 PM. 03/24/21  Yes Katsadouros, Vasilios, MD  hydrALAZINE (APRESOLINE) 25 MG tablet Take 1 tablet (25 mg total) by mouth every 8 (eight) hours. 07/13/21  Yes Virl Axe, MD  HYDROmorphone (DILAUDID) 2 MG tablet Take 1 tablet (2 mg total) by mouth daily as needed for up to 7 days for severe pain. 07/13/21 07/23/21 Yes Virl Axe, MD  naproxen sodium (ALEVE) 220 MG tablet Take 220 mg by mouth daily as needed (breakthrough pain).   Yes [provider]  sodium bicarbonate 650 MG tablet Take 2 tablets (1,300 mg total) by mouth 2 (two) times daily. 07/13/21  Yes Virl Axe, MD  topiramate (TOPAMAX) 25 MG tablet Take 1 tablet (25 mg total) by mouth at bedtime. 07/13/21  Yes Virl Axe, MD  venlafaxine XR (EFFEXOR XR) 37.5 MG 24 hr capsule Take 1 capsule (37.5 mg total) by mouth daily. 07/13/21  Yes Virl Axe, MD  Blood Glucose Monitoring Suppl (ACCU-CHEK AVIVA PLUS) W/DEVICE KIT 1 kit by Does not apply route as directed. 05/15/11   Bartholomew Crews, MD  Insulin Glargine (BASAGLAR KWIKPEN) 100 UNIT/ML SOPN Inject 0.2 mLs (20 Units total) into the skin daily. Patient not taking: Reported on 07/30/2021 05/28/19   Asencion Noble, MD  Lancet Devices [facility]) lancets Use as instructed Patient not taking: Reported on 05/22/2021 10/21/16   Dellia Nims, MD  metroNIDAZOLE (METROGEL) 0.75 % gel Apply 1 application topically 2 (two) times daily. Patient not taking: No sig reported 05/28/21   Masters, Joellen Jersey, DO   Allergies   Allergen Reactions   Ace Inhibitors     Dizziness, nausea   Percocet [Oxycodone-Acetaminophen] Itching   Statins Itching   Latex Rash   Sulfamethoxazole Rash   Sulfites Rash   Review of Systems Unable to assess  Physical Exam General: Intubated and sedated  HEENT: ET tube in place Heart: Regular rate and rhythm. No murmur appreciated. Lungs: Good air movement, clear Abdomen: Soft, nontender, distended, positive bowel sounds.   Ext: + edema Skin: Warm and dry  Vital Signs: BP 129/60   Pulse (!) 55   Temp (!) 97 F (36.1 C) (Bladder) Comment: room temp increased; warm blankets applied  Resp (!) 26   Ht '5\' 2"'  (1.575 m)   Wt 66.6 kg   LMP 01/12/2014   SpO2 93%   BMI 26.85 kg/m  Pain Scale: CPOT       SpO2: SpO2: 93 % O2 Device:SpO2: 93 % O2 Flow Rate: .O2 Flow Rate (L/min): 40 L/min  IO: Intake/output summary:  Intake/Output Summary (Last 24 hours) at 07/21/2021 1556 Last data filed at 07/21/2021 1542 Gross per 24 hour  Intake 3249.22 ml  Output 2135 ml  Net 1114.22 ml    LBM: Last BM Date: 07/21/21 Baseline Weight: Weight: 57.8 kg Most recent weight: Weight: 66.6 kg     Palliative Assessment/Data:   Flowsheet Rows    Flowsheet Row Most Recent Value  Intake Tab   Referral Department Hospitalist  Unit at Time of Referral ICU  Palliative Care Primary Diagnosis Sepsis/Infectious Disease  Date Notified 07/21/21  Palliative Care Type New Palliative care  Reason for referral Clarify Goals of Care  Date of Admission 07/19/2021  Date first seen by Palliative Care 07/21/21  # of days Palliative referral response time 0 Day(s)  # of days IP prior to Palliative referral 5  Clinical Assessment   Palliative Performance Scale Score 10%  Psychosocial & Spiritual Assessment   Palliative Care Outcomes   Patient/Family meeting held? Yes  Who was at the meeting? Daughter  Palliative Care Outcomes Clarified goals of care       Time In: 1200 Time Out:  1300 Time Total: 60 minutes Greater than 50%  of this time was spent counseling and coordinating care related to the above assessment and plan.  Signed by: Micheline Rough, MD   Please contact Palliative Medicine Team phone at [phone] for questions and concerns.  For individual provider: See Shea Evans

## 2021-07-21 NOTE — Progress Notes (Signed)
RT NOTE:  RT called to place patient back on full support due to ventilator alarming constantly. When RT entered the room the patients daughter asked if we (RT and RN) would try to give her mother longer on the wean mode. Pt calmed down after a few minutes and respiratory rate was back to normal. Patient back on wean mode at this time with the understanding that if the patients respiratory rate was to become elevated again we would give her lungs a resting period by going back on the full support settings, daughter states she is okay with this plan. MD and RN aware. Vitals are stable at this time. RT will continue to monitor.

## 2021-07-21 NOTE — Progress Notes (Signed)
NAME:  Kayla [name], MRN:  [id number], DOB:  March 08, 1961, LOS: 5 ADMISSION DATE:  07/27/2021, CONSULTATION DATE:  07/04/2021 REFERRING MD:  Darl Householder, ED  CHIEF COMPLAINT:  altered, short of breath   History of Present Illness:  60 y/o F with hx of HTN, HLD, fibromyalgia, graves disease, DM 2, and CKD 5 [baseline creatinine 7 mg percent in June 2022.  4.16 mg percent January 2021 and 6.4 mg percent in June 2021 and 3.56 mg percent in September 2020] presenting 10/17 with one day history of altered mental status, found to be hypoxic, hypothermic, anemic with Hgb 3, lactic acidosis, and hypertensive.  Requiring intubation in ER, started on broad spectrum antibiotics.  Some concern for UGIB, started on PPI gtt.  Stool hemoccult negative.  CXR showing bilateral lower lung airspace disease, L>R, and found to be COVID positive.   Of note, she had a PCP visit 07/13/21 for worsening mobility over a few months.  Unable to refill medications.   Chronic pain.    Pertinent  Medical History  Arthritis Hx of cellulitis COPD CKD 5- not on dialysis Deafness (r ear) DM  Peripheral neuropathy Fibromyalgia Hld HTN Hypothyroidism Graves dz hx-> hypothyroidism (untreated) Chronic met acidosis?   Significant Hospital Events: Including procedures, antibiotic start and stop dates in addition to other pertinent events   10/17 Admitted with pneumonia, sepsis, anemia, and possible myedema coma- intubated, transfused 5 units PRBC, COVID +, MRSA PCR +.  Empiric vanco/cefepime/flagyl.  Remdesivir.  10/18  Agitated on WUA, Hgb stable, changed to limited code, no CPR.  TTE with LVEF 50%, severe LVH, G2DD, LVH + RVH with bullseye LV longitudinal strain pattern concerning for cardiac amyloidosis.  Changed to rocephin / azithromycin.  10/19 Bite block placed for clenching teeth with protruding tongue starting to swell, agitated. - Continued issues with agitation whenever sedation weaned. Failed SBT 35;59 - RN reports no acute  events overnight. Pt remains agitated upon waking. Son remains at bedside. Remains on Fentanyl 100 and precedex at 0.7. Net + 10 L, 611 cc's urine output last 24 hour. Vent - PEEP 8/40%, peak 20's, failed SBT. Creatinine is 5.17 on 10/21 ( 5.42 on 10/20)   Interim History / Subjective:   07/21/21 -chest x-ray with standard COVID infiltrates.  Creatinine has improved into the fours after high-dose Lasix yesterday.  Remains on the ventilator FiO2 30%.  Sedated with fentanyl infusion and Precedex infusion and scheduled oral oxycodone and clonazepam.  On tube feeds.  Is afebrile.  White count dropping to 13 point 9K.  Possibly hypothermic mildly.  Culture negative so far.  Not on pressors. Overall +11.6 L since admission [improved from +11.9L yesterday] currently on Lasix 80 mg 3 times daily.  Continues to be hypertensive  Goals of care with daughter in presence of RT - > reports mom/patient ECOG 4 and being on wheelchair  (generalized weakness LE and cannot weight bear, has heel pressure injury) and ADL dependent since feb 2022. Failure to thrive + with "significant" weight loss of > 15#. Per RN - Sacral decub stagge 2 at admit (daughter denies) but both admit admit pressure injury on heel. Daughter thinks she transmitted covid to mom from work and feels remorseful. Wants mom to get better to go home but "do not want mom to be vegetable". Willing to meet with palliative care after eval course over nextr few to several days   Objective   Blood pressure (!) 156/61, pulse (!) 53, temperature (!) 96.6 F (35.9 C),  temperature source Bladder, resp. rate 16, height 5' 2" (1.575 m), weight 66.6 kg, last menstrual period 01/12/2014, SpO2 98 %.    Vent Mode: PRVC FiO2 (%):  [30 %] 30 % Set Rate:  [15 bmp] 15 bmp Vt Set:  [400 mL] 400 mL PEEP:  [8 cmH20] 8 cmH20 Plateau Pressure:  [13 cmH20-25 cmH20] 21 cmH20   Intake/Output Summary (Last 24 hours) at 07/21/2021 0810 Last data filed at 07/21/2021  0500 Gross per 24 hour  Intake 2458.11 ml  Output 1450 ml  Net 1008.11 ml   Filed Weights   07/04/2021 2200 07/20/21 0500 07/21/21 0500  Weight: 57.8 kg 66.3 kg 66.6 kg   Estimated body mass index is 26.85 kg/m as calculated from the following:   Height as of this encounter: 5' 2" (1.575 m).   Weight as of this encounter: 66.6 kg.  Examination:   General Appearance:  Looks criticall ill  Head:  Normocephalic, without obvious abnormality, atraumatic Eyes:  PERRL - yes, conjunctiva/corneas - muddy. PROPTOSIS +     Ears:  Normal external ear canals, both ears Nose:  G tube - no Throat:  ETT TUBE - yes , OG tube - yes Neck:  Supple,  No enlargement/tenderness/nodules Lungs: Clear to auscultation bilaterally, Ventilator   Synchrony - yes Heart:  S1 and S2 normal, no murmur, CVP - no.  Pressors - no Abdomen:  Soft, no masses, no organomegaly Genitalia / Rectal:  Not done Extremities:  Extremities- intact Skin:  ntact in exposed areas . Sacral area - Stage 2 sacral decub per report Neurologic:  Sedation - + -> RASS - -3 to +2 . Moves all 4s - yes. CAM-ICU - positive delirum . Orientation - not oriented     Resolved Hospital Problem list     Assessment & Plan:    ASSESSMENT / PLAN:  PULMONARY  A:  Acute hypoxemic respirtaory failure at admit 07/15/2021- severe needing intubation due to covid-19   07/21/2021 -> 07/21/2021 - > does yes meet criteria for SBT but notExtubation in setting of Acute Respiratory Failure due to acute ongoing encephalopaty   P:   PRVC SBT as tolerated VAP bundle   NEUROLOGIC A:   Chronic severe pain -opiate dependent on scheduled Dilaudid, on aspirin and caffeine powder with Tylenol- Prior to & Present on Admit Chronic depression-on Effexor - Prior to & Present on Admit Acute encephalopathy -present at admission. -Etiology of decompensated hypothyroidism and also COVID-19 and baseline opioid intake with fracture or   07/21/2021:  Encephalopathy a barrier for liberation from the ventilator.  Requiring fentanyl infusion, Precedex infusion , scheduled oxycodone and Klonopin.   P:   Stop scheduled Klonopin and oxycodone -especially in the setting of chronic kidney disease Continue fentanyl infusion Continue Precedex infusion Start Versed as needed Continue fentanyl as needed Start dilaudid prn Might consider methadone RASS sedation score 0 to -2     VASCULAR A:   History of hypertension - Prior to & Present on Admit -on hydralazine and amlodipine at home  P:  Continue scheduled amlodipine Holding hydralazine Increase Lasix Monitor blood pressure  CARDIAC STRUCTURAL A: Baseline echocardiogram June 2022  - Prior to & Present on Admit   -Severe left ventricular hypertrophy - -Grade 1 diastolic dysfunction  -Hyperdynamic right ventricle  -Mitral subvalvular fibrosis with calcification moderate  -Immobile fixed left coronary cusp  At admission -all of the above plus grade 2 diastolic dysfunction + bullseye LV longitudinal strain pattern on TTE, concerning for possible  cardiac amyloidosis  -Concern for acute on chronic diastolic heart failure P: Outpatient MRI Control blood pressure Diuresis  CARDIAC ELECTRICAL A: Sinus   P: tele  INFECTIOUS A:   Acute covid-19 - severe, presenting problem MRSA PCR positive - at admission  07/21/2021  - afebrile . Urin leg and strep - neg. PCT high but in setting of CKD  P:   Dc azithro Last day remedesviri 07/21/21 Cotninue ceftiraxone for now (prob dc 07/22/21)  RENAL A:  Chronic kidney disease stage V [creatinine 3.56 mg percent September 2020, 4.16 mg percent January 2021, 6.4 mg percent June 2021 and 7 mg percent June 2022] -not had dialysis -  HD not recommended this admission by renal given multiple baseline comorbidities / debility, poor medical compliance.  Daughter (works in hospice) agreeable    07/21/2021: Creatinine and volume overload  status of improved after starting 80 mg 3 times daily Lasix.  In fact creatinine better than recent baseline  P:  Increase scheduled Lasix Appreciate renal consult Probably no need for CRRT today 07/21/2021 Check vasculitis serology and connective tissue disease serology 07/21/2021 [facility] 2011 1: 1280]  ELECTROLYTES A:  Mild hypokalemia and mild hypomagnesemia 07/21/2021 P: Replete magnesium Replete KCl 20 mEq orally x1   GASTROINTESTINAL A:   Severe low albumin 3.1 g% and 2.7 g% at admit  -consistent with severe protein calorie malnutrition  P:   TF PPI  HEMATOLOGIC   - HEME A:  Chronic Anemia due to renal diseased on Aranesp =- Prior to & Present on Admit Severe life-threatening anemia present on admit-hemoglobin 3 g% and status post 5 units PRBC 07/15/2021 no active bleeding - Initial concern for possible UGIB, doubtful, OGT output bilious, stool hemoccult - negative.   07/21/2021: Hemoglobin 10.8 g%.  No active bleeding  P:  - PRBC for hgb </= 6.9gm%    - exceptions are   -  if ACS susepcted/confirmed then transfuse for hgb </= 8.0gm%,  or    -  active bleeding with hemodynamic instability, then transfuse regardless of hemoglobin value   At at all times try to transfuse 1 unit prbc as possible with exception of active hemorrhage   HEMATOLOGIC - Platelets A History of thrombocytopenia since 2021 [facility] June 2021, 65-97 K. June-July 2022]  10/22 - plat 51k and worse  P Stop heparin Check HITT panel Urgebnt duplex LE - if positive start ARgotraban  ENDOCRINE A:   Diabetes on insulin -HgbA1C was 5.4 in 03/2021  - Prior to & Present on Admit Hx of Graves diseases and then -> Untreated Hypothyroidism - Prior to & Present on Admit Decompensated hypothyroidism - At Admit (hypothermia, bradycardia, and Acute encephalopath, cortisol >100.  TSH 140, Free T4 <0.25. )  P:   Ssi IV synthroid  MSK/DERM Coccyx and right heel, present on admit -tissue pressure  injury Stage 2 sacral decub - presnent on admit (per RN, daughter denies)  Failure to thrive history elicited 57/26 - but Prior to & Present on [place] care consult ongoing [place] consult   Best Practice (right click and "Reselect all SmartList Selections" daily)  Diet/type: NPO; TF DVT prophylaxis: prophylactic heparin  GI prophylaxis: PPI Lines: N/A - PICC Foley:  Yes, and it is still needed Code Status:  limited- no CPR Last date of multidisciplinary goals of care discussion: Son updated 10/20 at bedside.     ATTESTATION & SIGNATURE   The patient Kayla [name] is critically ill with multiple organ systems  failure and requires high complexity decision making for assessment and support, frequent evaluation and titration of therapies, application of advanced monitoring technologies and extensive interpretation of multiple databases.   Critical Care Time devoted to patient care services described in this note is  100  Minutes. This time reflects time of care of this signee Dr Brand Males. This critical care time does not reflect procedure time, or teaching time or supervisory time of PA/NP/Med student/Med Resident etc but could involve care discussion time     Dr. Brand Males, M.D., [facility].C.P Pulmonary and Critical Care Medicine Staff Physician [place] [facility] Pager: [phone], If no answer or between  15:00h - 7:00h: call [phone]  07/21/2021 8:11 AM    LABS    PULMONARY Recent Labs  Lab 07/13/2021 1832 07/17/21 0000 07/17/21 0451 07/17/21 1108 07/18/21 0330  PHART 7.104* 7.248* 7.284* 7.266* 7.450  PCO2ART 28.3* 26.7* 24.1* 29.0* 20.0*  PO2ART 67.5* 41.0* 315* 65.6* 88.4  HCO3 8.5* 11.3* 10.9* 12.8* 13.8*  O2SAT 88.5 76.3 99.9 92.5 97.9    CBC Recent Labs  Lab 07/18/21 0322 07/20/21 0304 07/21/21 0530  HGB 12.0 11.2* 10.8*  HCT 33.8* 31.6* 30.3*  WBC 14.6* 18.1* 13.9*  PLT 79* 70* 51*     COAGULATION Recent Labs  Lab 07/03/2021 1747 07/17/21 0953  INR 1.3* 1.2    CARDIAC  No results for input(s): TROPONINI in the last 168 hours. No results for input(s): PROBNP in the last 168 hours.   CHEMISTRY Recent Labs  Lab 07/17/21 0953 07/18/21 0322 07/18/21 1850 07/19/21 0254 07/19/21 1038 07/19/21 1745 07/20/21 0304 07/21/21 0530  NA 138 137  --   --  136  --  136 138  K 4.3 4.3  --   --  4.0  --  3.8 3.7  CL 110 107  --   --  104  --  103 107  CO2 14* 13*  --   --  16*  --  17* 16*  GLUCOSE 109* 145*  --   --  181*  --  194* 106*  BUN 85* 74*  --   --  85*  --  87* 86*  CREATININE 5.46* 5.35*  --   --  5.42*  --  5.17* 4.61*  CALCIUM 6.8* 6.6*  --   --  6.7*  --  6.9* 7.2*  MG  --   --  1.3* 1.9  --  1.8 1.7 1.9  PHOS 7.5*  --  7.0* 7.0*  --  7.6* 7.3*  --    Estimated Creatinine Clearance: 11.8 mL/min (A) (by C-G formula based on SCr of 4.61 mg/dL (H)).   LIVER Recent Labs  Lab 07/15/2021 1747 07/17/21 0953 07/18/21 0322 07/20/21 0304  AST 29  --  28 24  ALT 11  --  10 10  ALKPHOS 108  --  97 94  BILITOT 0.8  --  0.9 0.9  PROT 5.7*  --  5.1* 5.4*  ALBUMIN 3.1* 2.7* 2.4* 2.6*  INR 1.3* 1.2  --   --      INFECTIOUS Recent Labs  Lab 07/22/2021 1747 07/23/2021 1834 07/17/21 1531 07/18/21 0322 07/19/21 0254  LATICACIDVEN 4.3* 2.4*  --   --   --   PROCALCITON  --   --  27.95 28.90 23.41     ENDOCRINE CBG (last 3)  Recent Labs    07/20/21 2336 07/21/21 4268 07/21/21 3419  GLUCAP 120* 107* 112*         IMAGING x48h  - image(s) personally visualized  -   highlighted in bold DG CHEST PORT 1 VIEW  Result Date: 07/20/2021 CLINICAL DATA:  Acute respiratory failure.  Ventilator. EXAM: PORTABLE CHEST 1 VIEW COMPARISON:  07/17/2021 FINDINGS: Endotracheal tube approximately 3 cm above the carina. NG tube coiled in the stomach with the tip in the fundus. Right arm PICC in the region of the cavoatrial junction. Cardiac enlargement. Vascular  congestion with bilateral airspace disease. Mild improvement in aeration since the prior study. Bibasilar atelectasis. Small left effusion. IMPRESSION: Right arm PICC tip near the cavoatrial junction. Cardiac enlargement with diffuse bilateral airspace disease. Possible edema or pneumonia. Improved aeration since the prior study. Electronically Signed   By: Franchot Gallo M.D.   On: 07/20/2021 12:32

## 2021-07-22 ENCOUNTER — Inpatient Hospital Stay (HOSPITAL_COMMUNITY): Payer: Self-pay

## 2021-07-22 LAB — COMPREHENSIVE METABOLIC PANEL
ALT: 8 U/L (ref 0–44)
AST: 15 U/L (ref 15–41)
Albumin: 2.3 g/dL — ABNORMAL LOW (ref 3.5–5.0)
Alkaline Phosphatase: 86 U/L (ref 38–126)
Anion gap: 18 — ABNORMAL HIGH (ref 5–15)
BUN: 102 mg/dL — ABNORMAL HIGH (ref 6–20)
CO2: 16 mmol/L — ABNORMAL LOW (ref 22–32)
Calcium: 7.9 mg/dL — ABNORMAL LOW (ref 8.9–10.3)
Chloride: 104 mmol/L (ref 98–111)
Creatinine, Ser: 4.84 mg/dL — ABNORMAL HIGH (ref 0.44–1.00)
GFR, Estimated: 10 mL/min — ABNORMAL LOW (ref >60)
Glucose, Bld: 187 mg/dL — ABNORMAL HIGH (ref 70–99)
Potassium: 4.3 mmol/L (ref 3.5–5.1)
Sodium: 138 mmol/L (ref 135–145)
Total Bilirubin: 0.7 mg/dL (ref 0.3–1.2)
Total Protein: 5.1 g/dL — ABNORMAL LOW (ref 6.5–8.1)

## 2021-07-22 LAB — CBC
HCT: 30.1 % — ABNORMAL LOW (ref 36.0–46.0)
Hemoglobin: 10.6 g/dL — ABNORMAL LOW (ref 12.0–15.0)
MCH: 30.2 pg (ref 26.0–34.0)
MCHC: 35.2 g/dL (ref 30.0–36.0)
MCV: 85.8 fL (ref 80.0–100.0)
Platelets: 43 10*3/uL — ABNORMAL LOW (ref 150–400)
RBC: 3.51 MIL/uL — ABNORMAL LOW (ref 3.87–5.11)
RDW: 17 % — ABNORMAL HIGH (ref 11.5–15.5)
WBC: 10.4 10*3/uL (ref 4.0–10.5)
nRBC: 0.4 % — ABNORMAL HIGH (ref 0.0–0.2)

## 2021-07-22 LAB — GLUCOSE, CAPILLARY
Glucose-Capillary: 187 mg/dL — ABNORMAL HIGH (ref 70–99)
Glucose-Capillary: 172 mg/dL — ABNORMAL HIGH (ref 70–99)
Glucose-Capillary: 139 mg/dL — ABNORMAL HIGH (ref 70–99)
Glucose-Capillary: 130 mg/dL — ABNORMAL HIGH (ref 70–99)
Glucose-Capillary: 161 mg/dL — ABNORMAL HIGH (ref 70–99)

## 2021-07-22 LAB — PROTIME-INR
INR: 1.4 — ABNORMAL HIGH (ref 0.8–1.2)
Prothrombin Time: 16.9 seconds — ABNORMAL HIGH (ref 11.4–15.2)

## 2021-07-22 LAB — LACTIC ACID, PLASMA: Lactic Acid, Venous: 1.1 mmol/L (ref 0.5–1.9)

## 2021-07-22 LAB — MAGNESIUM: Magnesium: 2.4 mg/dL (ref 1.7–2.4)

## 2021-07-22 MED ORDER — DEXMEDETOMIDINE HCL IN NACL 200 MCG/50ML IV SOLN
0.4000 ug/kg/h | INTRAVENOUS | Status: DC
  Administered 2021-07-22: 0.6 ug/kg/h via INTRAVENOUS
  Administered 2021-07-22: 0.4 ug/kg/h via INTRAVENOUS
  Administered 2021-07-23: 0.8 ug/kg/h via INTRAVENOUS
  Administered 2021-07-23: 0.4 ug/kg/h via INTRAVENOUS
  Administered 2021-07-24 (×5): 1.1 ug/kg/h via INTRAVENOUS
  Administered 2021-07-24: 1 ug/kg/h via INTRAVENOUS
  Administered 2021-07-24: 1.1 ug/kg/h via INTRAVENOUS
  Administered 2021-07-24: 0.7 ug/kg/h via INTRAVENOUS
  Filled 2021-07-22 (×4): qty 50
  Filled 2021-07-22: qty 100
  Filled 2021-07-22 (×3): qty 50
  Filled 2021-07-22: qty 100
  Filled 2021-07-22 (×3): qty 50
  Filled 2021-07-22: qty 100

## 2021-07-22 MED ORDER — HALOPERIDOL LACTATE 5 MG/ML IJ SOLN
5.0000 mg | INTRAMUSCULAR | Status: AC
  Administered 2021-07-22: 5 mg via INTRAVENOUS
  Filled 2021-07-22: qty 1

## 2021-07-22 MED ORDER — HYDRALAZINE HCL 20 MG/ML IJ SOLN
10.0000 mg | INTRAMUSCULAR | Status: DC | PRN
  Administered 2021-07-22 – 2021-07-25 (×2): 20 mg via INTRAVENOUS
  Filled 2021-07-22 (×3): qty 1

## 2021-07-22 NOTE — Progress Notes (Signed)
Palliative care brief note  I was going to check in on Kayla [name] today, however, she was agitated and staff was working to calm agitation and ensure she did not self extubate.  Therefore, I deferred on visit today.  Will attempt follow-up again tomorrow.  Micheline Rough, MD [place] Team [phone]

## 2021-07-22 NOTE — Progress Notes (Signed)
NAME:  Kayla [name], MRN:  [id number], DOB:  1961-05-13, LOS: 6 ADMISSION DATE:  07/07/2021, CONSULTATION DATE:  06/30/2021 REFERRING MD:  Darl Householder, ED  CHIEF COMPLAINT:  altered, short of breath   BRIEF  60 y/o F with hx of HTN, HLD, fibromyalgia, graves disease, DM 2, and CKD 5 [baseline creatinine 7 mg percent in June 2022.  4.16 mg percent January 2021 and 6.4 mg percent in June 2021 and 3.56 mg percent in September 2020] presenting 10/17 with one day history of altered mental status, found to be hypoxic, hypothermic, anemic with Hgb 3, lactic acidosis, and hypertensive.  Requiring intubation in ER, started on broad spectrum antibiotics.  Some concern for UGIB, started on PPI gtt.  Stool hemoccult negative.  CXR showing bilateral lower lung airspace disease, L>R, and found to be COVID positive.   Of note, she had a PCP visit 07/13/21 for worsening mobility over a few months.  Unable to refill medications.   Chronic pain.    Pertinent  Medical History  Arthritis Hx of cellulitis COPD CKD 5- not on dialysis Deafness (r ear) DM  Peripheral neuropathy Fibromyalgia Hld HTN Hypothyroidism Graves dz hx-> hypothyroidism (untreated) Chronic met acidosis?   Significant Hospital Events: Including procedures, antibiotic start and stop dates in addition to other pertinent events   10/17 Admitted with pneumonia, sepsis, anemia, and possible myedema coma- intubated, transfused 5 units PRBC, COVID +, MRSA PCR +.  Empiric vanco/cefepime/flagyl.  Remdesivir.  Blood culture 10/17 growing diphtheroids on 07/22/2021 10/18  Agitated on WUA, Hgb stable, changed to limited code, no CPR.  TTE with LVEF 50%, severe LVH, G2DD, LVH + RVH with bullseye LV longitudinal strain pattern concerning for cardiac amyloidosis.  Changed to rocephin / azithromycin.  10/19 Bite block placed for clenching teeth with protruding tongue starting to swell, agitated. - Continued issues with agitation whenever sedation weaned. Failed  SBT 01;77 - RN reports no acute events overnight. Pt remains agitated upon waking. Son remains at bedside. Remains on Fentanyl 100 and precedex at 0.7. Net + 10 L, 611 cc's urine output last 24 hour. Vent - PEEP 8/40%, peak 20's, failed SBT. Creatinine is 5.17 on 10/21 ( 5.42 on 10/20)  07/21/21 -chest x-ray with standard COVID infiltrates.  Creatinine has improved into the fours after high-dose Lasix yesterday.  Remains on the ventilator FiO2 30%.  Sedated with fentanyl infusion and Precedex infusion and scheduled oral oxycodone and clonazepam.  On tube feeds.  Is afebrile.  White count dropping to 13 point 9K.  Possibly hypothermic mildly.  Culture negative so far.  Not on pressors. Baseline hx - ECOG 4 with weight loss, failure to thrive, stage 2 decub ulcer, immobile. Overall +11.6 L since admission [improved from +11.9L yesterday] currently on Lasix 80 mg 3 times daily.  Continues to be hypertensive Pall care consult - no CPR. No HD. Ongoing goals Covid IgG - negative (so far at day 5)  Interim History / Subjective:   07/22/21 -remains on the ventilator.  Precedex was causing some slight bradycardia so this is being stopped.  Patient is on fentanyl infusion with the Dilaudid as needed, fentanyl as needed and Versed as needed.  Platelets have dropped further to 43,000.  Vascular ultrasound still pending.  No active bleeding.  Is on high-dose Lasix making good urine.  Creatinine slightly up at 4.8 mg percent but still better than admission.  Not on pressors   -Blood culture 07/14/2021: Growing diphtheroid's. AFebrile since 10/18 on  Objective   Blood pressure 140/60, pulse (!) 54, temperature 97.7 F (36.5 C), resp. rate 19, height _0  (1.575 m), weight 70.6 kg, last menstrual period 01/12/2014, SpO2 92 %.    Vent Mode: PRVC FiO2 (%):  [30 %] 30 % Set Rate:  [15 bmp] 15 bmp Vt Set:  [400 mL] 400 mL PEEP:  [5 cmH20] 5 cmH20 Pressure Support:  [15 cmH20] 15 cmH20 Plateau Pressure:  [18  cmH20-20 cmH20] 19 cmH20   Intake/Output Summary (Last 24 hours) at 07/22/2021 1155 Last data filed at 07/22/2021 1022 Gross per 24 hour  Intake 1191.8 ml  Output 1375 ml  Net -183.2 ml   Filed Weights   07/20/21 0500 07/21/21 0500 07/22/21 0500  Weight: 66.3 kg 66.6 kg 70.6 kg   Estimated body mass index is 28.47 kg/m as calculated from the following:   Height as of this encounter: _1  (1.575 m).   Weight as of this encounter: 70.6 kg.  Examination:   General Appearance:  Looks criticall ill Obest+ Head:  Normocephalic, without obvious abnormality, atraumatic Eyes:  PERRL - yes, conjunctiva/corneas - mudd     Ears:  Normal external ear canals, both ears Nose:  G tube - yes Throat:  ETT TUBE - yes , OG tube - no Neck:  Supple,  No enlargement/tenderness/nodules Lungs: Clear to auscultation bilaterally, Ventilator   Synchrony - slight dysnch Heart:  S1 and S2 normal, no murmur, CVP - nono.  Pressors - no Abdomen:  Soft, no masses, no organomegaly Genitalia / Rectal:  Not done Extremities:  Extremities- intact Skin:  ntact in exposed areas . Sacral area - stage 2 decub per report Neurologic:  Sedation - fent gtt ,  -> RASS - +1 . Moves all 4s - yes. CAM-ICU - positive . Orientation - not oriented        Resolved Hospital Problem list     Assessment & Plan:    Acute hypoxemic respirtaory failure at admit 07/21/2021- severe needing intubation due to covid-19    07/22/2021 - > does yes meet criteria for SBT but notExtubation in setting of Acute Respiratory Failure due to acute ongoing encephalopaty and volume overload   P:   PRVC SBT as tolerated VAP bundle    Chronic severe pain -opiate dependent on scheduled Dilaudid, on aspirin and caffeine powder with Tylenol- Prior to & Present on Admit Chronic depression-on Effexor - Prior to & Present on Admit Acute encephalopathy -present at admission. -Etiology of decompensated hypothyroidism and also COVID-19 and  baseline opioid intake with fracture or   07/22/2021: Encephalopathy a barrier for liberation from the ventilator.   But improved 10/23 after dc scheduled Klonopin and oxycodone on 07/21/2021.  This morning is off Precedex infusion as well.  P:   Continue fentanyl infusion Restart  Precedex infusion if needed continue Start Versed as needed Continue fentanyl as needed Continue dilaudid prn Might consider methadone RASS sedation score 0 to -2       History of hypertension - Prior to & Present on Admit -on hydralazine and amlodipine at home  07/22/2021: Hypertensive with systolic 628-315  P:  Continue scheduled amlodipine Restart hydralazine -as needed with a goal systolic between 176 and 160 Continue high-dose Lasix Monitor blood pressure  Baseline echocardiogram June 2022  - Prior to & Present on Admit   -Severe left ventricular hypertrophy - -Grade 1 diastolic dysfunction  -Hyperdynamic right ventricle  -Mitral subvalvular fibrosis with calcification moderate  -Immobile fixed left coronary cusp  At admission -all of the above plus grade 2 diastolic dysfunction + bullseye LV longitudinal strain pattern on TTE, concerning for possible cardiac amyloidosis  -Concern for acute on chronic diastolic heart failure P: Outpatient MRI Control blood pressure Diuresis   :   Acute covid-19 - severe, presenting problem MRSA PCR positive - at admission ( . 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 contaminant  07/22/2021  - afebrile. PCT very high but in setting of CKD. Blood culture admit 10/17 growing diptheroids.PCT trending down  P:   Cotninue ceftiraxone for now  -d w.  Pharmacy  Await diphteroid speciation v check with ID 07/23/21 xxxxx Remdesivir 10/17 - 10/22 Vanc 10/17 - 10/17 Flagyl 10/17 - 10/17  Azithro  10/18 >.10/22 Cefepime 10/17 - 10/18  Ceftriaxone 10/18 - >>      Chronic kidney disease  stage V [creatinine 3.56 mg percent September 2020, 4.16 mg percent January 2021, 6.4 mg percent June 2021 and 7 mg percent June 2022] -not had dialysis -  HD not recommended this admission by renal given multiple baseline comorbidities / debility, poor medical compliance.  Daughter (works in hospice) agreeable foir no chronic HD as of 07/21/21   07/22/2021: Improved volume status of +10.7 L [+12 L on 07/20/2021] -with high-dose Lasix x48 hours   P:  Continue high-dose scheduled Lasix Appreciate renal consult Probably no need for CRRT today 07/22/2021 Await vasculitis serology and connective tissue disease serology 07/22/2021 Wilhemena Durie 2011  was 1: 1280]   Normal electrolytes 07/22/2021 P: Monitor and replete as needed     Severe low albumin 3.1 g% and 2.7 g% at admit  -consistent with severe protein calorie malnutrition - Prior to & Present on Admit   P:   TF PPI   Chronic Anemia due to renal diseased on Aranesp =- Prior to & Present on Admit Severe life-threatening anemia present on admit-hemoglobin 3 g% and status post 5 units PRBC 07/29/2021 no active bleeding - Initial concern for possible UGIB, doubtful, OGT output bilious, stool hemoccult - negative.   07/22/2021: Hemoglobin 10.6 g%.  No active bleeding  P:  - PRBC for hgb </= 6.9gm%    - exceptions are   -  if ACS susepcted/confirmed then transfuse for hgb </= 8.0gm%,  or    -  active bleeding with hemodynamic instability, then transfuse regardless of hemoglobin value   At at all times try to transfuse 1 unit prbc as possible with exception of active hemorrhage    History of thrombocytopenia since 2021 [facility] June 2021, 65-97 K. June-July 2022]- Prior to & Present on Admit   10/23 - plat 40s k  and worse  P Off heparin since 07/21/21 Await HITT panel 07/21/21 Urgebnt duplex LE - if positive start ARgotraban -> ordered 10/22//22 - still pending     Diabetes on insulin -HgbA1C was 5.4 in 03/2021  - Prior to &  Present on Admit Hx of Graves diseases and then -> Untreated Hypothyroidism - Prior to & Present on Admit Decompensated hypothyroidism - At Admit (hypothermia, bradycardia, and Acute encephalopath, cortisol >100.  TSH 140, Free T4 <0.25. )  P:   Ssi IV synthroid  MSK/DERM Coccyx and right heel, -tissue pressure injury - Prior to & Present on Admit Stage 2 sacral decub - presnent on admit (per RN, daughter denies)  Failure to thrive history elicited 40/98 - but Prior to & Present on Admit Severe PCM -  Prior to & Present on [place] care consult ongoing Nutn consult   Best Practice (right click and "Reselect all SmartList Selections" daily)  Diet/type: NPO; TF DVT prophylaxis: prophylactic heparin  GI prophylaxis: PPI Lines: N/A - PICC Foley:  Yes, and it is still needed Code Status:  limited- no CPR Last date of multidisciplinary goals of care discussion: Son updated 10/20 at bedside.   Extensive update to daughter 07/01/21  Palliative care consult - 07/21/21 -no CPR and no chronic dialysis but full medical care.  Ongoing goals and further clarification of CODE STATUS [currently is is desiring pressors, defibrillation and ACLS chemical code in the case of an arrest]   [place]   The patient Etola Mull is critically ill with multiple organ systems failure and requires high complexity decision making for assessment and support, frequent evaluation and titration of therapies, application of advanced monitoring technologies and extensive interpretation of multiple databases.   Critical Care Time devoted to patient care services described in this note is  41 making Dr. Chase Caller may speak to pharmacist please thank you Servando Salina will ceftriaxone, diphtheroids in the blood culture minutes. This time reflects time of care of this signee Dr Brand Males. This critical care time does not reflect procedure time, or teaching time or supervisory time of  PA/NP/Med student/Med Resident etc but could involve care discussion time     Dr. Brand Males, M.D., [facility].C.P Pulmonary and Critical Care Medicine Staff Physician [place] [facility] Pager: [phone], If no answer or between  15:00h - 7:00h: call [phone]  07/22/2021 12:44 PM    LABS    PULMONARY Recent Labs  Lab 07/28/2021 1832 07/17/21 0000 07/17/21 0451 07/17/21 1108 07/18/21 0330  PHART 7.104* 7.248* 7.284* 7.266* 7.450  PCO2ART 28.3* 26.7* 24.1* 29.0* 20.0*  PO2ART 67.5* 41.0* 315* 65.6* 88.4  HCO3 8.5* 11.3* 10.9* 12.8* 13.8*  O2SAT 88.5 76.3 99.9 92.5 97.9    CBC Recent Labs  Lab 07/20/21 0304 07/21/21 0530 07/22/21 0446  HGB 11.2* 10.8* 10.6*  HCT 31.6* 30.3* 30.1*  WBC 18.1* 13.9* 10.4  PLT 70* 51* 43*    COAGULATION Recent Labs  Lab 07/15/2021 1747 07/17/21 0953  INR 1.3* 1.2    CARDIAC  No results for input(s): TROPONINI in the last 168 hours. No results for input(s): PROBNP in the last 168 hours.   CHEMISTRY Recent Labs  Lab 07/17/21 0953 07/18/21 0322 07/18/21 1850 07/18/21 1850 07/19/21 0254 07/19/21 1038 07/19/21 1745 07/20/21 0304 07/21/21 0530 07/22/21 0446  NA 138 137  --   --   --  136  --  136 138 138  K 4.3 4.3  --   --   --  4.0  --  3.8 3.7 4.3  CL 110 107  --   --   --  104  --  103 107 104  CO2 14* 13*  --   --   --  16*  --  17* 16* 16*  GLUCOSE 109* 145*  --   --   --  181*  --  194* 106* 187*  BUN 85* 74*  --   --   --  85*  --  87* 86* 102*  CREATININE 5.46* 5.35*  --   --   --  5.42*  --  5.17* 4.61* 4.84*  CALCIUM 6.8* 6.6*  --   --   --  6.7*  --  6.9* 7.2* 7.9*  MG  --   --  1.3*   < > 1.9  --  1.8 1.7 1.9 2.4  PHOS 7.5*  --  7.0*  --  7.0*  --  7.6* 7.3*  --   --    < > = values in this interval not displayed.   Estimated Creatinine Clearance: 11.5 mL/min (A) (by C-G formula based on SCr of 4.84 mg/dL (H)).   LIVER Recent Labs  Lab 06/30/2021 1747  07/17/21 0953 07/18/21 0322 07/20/21 0304 07/22/21 0446  AST 29  --  _0 ALT 11  --  _1 ALKPHOS 108  --  97 94 86  BILITOT 0.8  --  0.9 0.9 0.7  PROT 5.7*  --  5.1* 5.4* 5.1*  ALBUMIN 3.1* 2.7* 2.4* 2.6* 2.3*  INR 1.3* 1.2  --   --   --      INFECTIOUS Recent Labs  Lab 06/30/2021 1747 07/19/2021 1834 07/17/21 1531 07/18/21 0322 07/19/21 0254 07/21/21 0530 07/22/21 0446  LATICACIDVEN 4.3* 2.4*  --   --   --   --  1.1  PROCALCITON  --   --    < > 28.90 23.41 15.43  --    < > = values in this interval not displayed.     ENDOCRINE CBG (last 3)  Recent Labs    07/22/21 0306 07/22/21 0750 07/22/21 1132  GLUCAP 187* 172* 139*         IMAGING x48h  - image(s) personally visualized  -   highlighted in bold DG CHEST PORT 1 VIEW  Result Date: 07/21/2021 CLINICAL DATA:  Respiratory failure. EXAM: PORTABLE CHEST 1 VIEW COMPARISON:  07/20/2021 FINDINGS: ET tube tip is above the carina. NG tube tip and side port are below the GE junction. Right arm PICC line tip is in the right atrium. Stable cardiomediastinal contours. Left lower lobe airspace consolidation with air bronchograms is unchanged. Bilateral patchy areas of discoid atelectasis noted within the mid and lower lung zones as before. IMPRESSION: 1. No change in aeration to the left lower lobe. 2. Stable support apparatus. Electronically Signed   By: Kerby Moors M.D.   On: 07/21/2021 08:16   DG Chest Port 1V same Day  Result Date: 07/22/2021 CLINICAL DATA:  Follow-up chest x-ray.  Respiratory failure. EXAM: PORTABLE CHEST 1 VIEW COMPARISON:  07/21/2021 FINDINGS: Right arm PICC line tip is in the right atrium. ET tube tip is above the carina. There is an NG tube with tip in side port below GE junction. Stable cardiomediastinal contours. Dense retrocardiac opacification within the left lung base is again noted concerning for pneumonia. Atelectasis within the right midlung and right lung base is unchanged.  IMPRESSION: 1. No change in aeration to the lungs compared with previous exam. 2. Stable support apparatus. Electronically Signed   By: Kerby Moors M.D.   On: 07/22/2021 08:39

## 2021-07-22 NOTE — Progress Notes (Signed)
Paient off precedex gtt earlir today and then got agitated and nearly self extubated RT was able to secure tube. Per RN good Ve but patient agitated  Plan  - continue fent gtt  - do fent and dilaudid prn   - do versed prn   - restart precedex gtt  - if needed start versed gtt - haldol 5mg  IV x 1 stat (as long as QTc < 544msec)   SIGNATURE    Dr. Brand Males, M.D., F.C.C.P,  Pulmonary and Critical Care Medicine Staff Physician, [place] Director - Interstitial Lung Disease  Program  Pulmonary [place] at [place], [place], [address]  NPI Number:  NPI #[id number]  Pager: [phone], If no answer  -> Check AMION or Try [phone] Telephone (clinical office): [phone] Telephone (research): [phone]  4:01 PM 07/22/2021

## 2021-07-22 NOTE — Progress Notes (Signed)
RT NOTE:  When RT entered pt room pt was slumped over to her right ride and had her right hand wrapped around the ET tube. RT quickly got  the pt to let go of the tube, the pt had pulled the tube out from 23 at the lips to 17 at the lips. After pt calmed down RT was able to get the tube back to 23 at the lips. MD is aware. RN placed order for CXR to verify tube placement.

## 2021-07-23 ENCOUNTER — Inpatient Hospital Stay (HOSPITAL_COMMUNITY): Payer: Self-pay

## 2021-07-23 DIAGNOSIS — Z9911 Dependence on respirator [ventilator] status: Secondary | ICD-10-CM

## 2021-07-23 DIAGNOSIS — R609 Edema, unspecified: Secondary | ICD-10-CM

## 2021-07-23 LAB — DIC (DISSEMINATED INTRAVASCULAR COAGULATION)PANEL
D-Dimer, Quant: 5.05 ug/mL-FEU — ABNORMAL HIGH (ref 0.00–0.50)
Fibrinogen: 414 mg/dL (ref 210–475)
INR: 1.3 — ABNORMAL HIGH (ref 0.8–1.2)
Platelets: 46 10*3/uL — ABNORMAL LOW (ref 150–400)
Prothrombin Time: 16.3 seconds — ABNORMAL HIGH (ref 11.4–15.2)
Smear Review: NONE SEEN
aPTT: 59 seconds — ABNORMAL HIGH (ref 24–36)

## 2021-07-23 LAB — URINE CULTURE: Culture: <10000 — AB

## 2021-07-23 LAB — CBC
HCT: 30.2 % — ABNORMAL LOW (ref 36.0–46.0)
Hemoglobin: 10.4 g/dL — ABNORMAL LOW (ref 12.0–15.0)
MCH: 30 pg (ref 26.0–34.0)
MCHC: 34.4 g/dL (ref 30.0–36.0)
MCV: 87 fL (ref 80.0–100.0)
Platelets: 45 10*3/uL — ABNORMAL LOW (ref 150–400)
RBC: 3.47 MIL/uL — ABNORMAL LOW (ref 3.87–5.11)
RDW: 17.4 % — ABNORMAL HIGH (ref 11.5–15.5)
WBC: 12.9 10*3/uL — ABNORMAL HIGH (ref 4.0–10.5)
nRBC: 0.5 % — ABNORMAL HIGH (ref 0.0–0.2)

## 2021-07-23 LAB — COMPREHENSIVE METABOLIC PANEL
ALT: 11 U/L (ref 0–44)
AST: 18 U/L (ref 15–41)
Albumin: 2.5 g/dL — ABNORMAL LOW (ref 3.5–5.0)
Alkaline Phosphatase: 97 U/L (ref 38–126)
Anion gap: 16 — ABNORMAL HIGH (ref 5–15)
BUN: 117 mg/dL — ABNORMAL HIGH (ref 6–20)
CO2: 16 mmol/L — ABNORMAL LOW (ref 22–32)
Calcium: 8.5 mg/dL — ABNORMAL LOW (ref 8.9–10.3)
Chloride: 106 mmol/L (ref 98–111)
Creatinine, Ser: 4.79 mg/dL — ABNORMAL HIGH (ref 0.44–1.00)
GFR, Estimated: 10 mL/min — ABNORMAL LOW (ref >60)
Glucose, Bld: 211 mg/dL — ABNORMAL HIGH (ref 70–99)
Potassium: 4.5 mmol/L (ref 3.5–5.1)
Sodium: 138 mmol/L (ref 135–145)
Total Bilirubin: 0.6 mg/dL (ref 0.3–1.2)
Total Protein: 5.5 g/dL — ABNORMAL LOW (ref 6.5–8.1)

## 2021-07-23 LAB — GLUCOSE, CAPILLARY
Glucose-Capillary: 117 mg/dL — ABNORMAL HIGH (ref 70–99)
Glucose-Capillary: 126 mg/dL — ABNORMAL HIGH (ref 70–99)
Glucose-Capillary: 154 mg/dL — ABNORMAL HIGH (ref 70–99)
Glucose-Capillary: 157 mg/dL — ABNORMAL HIGH (ref 70–99)
Glucose-Capillary: 186 mg/dL — ABNORMAL HIGH (ref 70–99)
Glucose-Capillary: 202 mg/dL — ABNORMAL HIGH (ref 70–99)

## 2021-07-23 LAB — SJOGRENS SYNDROME-A EXTRACTABLE NUCLEAR ANTIBODY: SSA (Ro) (ENA) Antibody, IgG: <0.2 AI (ref 0.0–0.9)

## 2021-07-23 LAB — SJOGRENS SYNDROME-B EXTRACTABLE NUCLEAR ANTIBODY: SSB (La) (ENA) Antibody, IgG: <0.2 AI (ref 0.0–0.9)

## 2021-07-23 MED ORDER — SODIUM BICARBONATE 8.4 % IV SOLN
50.0000 meq | Freq: Once | INTRAVENOUS | Status: AC
  Administered 2021-07-23: 50 meq via INTRAVENOUS
  Filled 2021-07-23: qty 50

## 2021-07-23 MED ORDER — GUAIFENESIN 100 MG/5ML PO LIQD
5.0000 mL | ORAL | Status: AC
  Filled 2021-07-23: qty 10

## 2021-07-23 MED ORDER — CLEVIDIPINE BUTYRATE 0.5 MG/ML IV EMUL
0.0000 mg/h | INTRAVENOUS | Status: DC
Start: 2021-07-23 — End: 2021-07-26
  Administered 2021-07-23: 2 mg/h via INTRAVENOUS
  Administered 2021-07-23: 4 mg/h via INTRAVENOUS
  Administered 2021-07-23 – 2021-07-24 (×2): 3 mg/h via INTRAVENOUS
  Administered 2021-07-24: 6 mg/h via INTRAVENOUS
  Administered 2021-07-24 (×2): 4 mg/h via INTRAVENOUS
  Administered 2021-07-24: 5 mg/h via INTRAVENOUS
  Administered 2021-07-25: 6 mg/h via INTRAVENOUS
  Administered 2021-07-25: 8 mg/h via INTRAVENOUS
  Administered 2021-07-25: 4 mg/h via INTRAVENOUS
  Filled 2021-07-23 (×16): qty 50

## 2021-07-23 MED ORDER — ORAL CARE MOUTH RINSE
15.0000 mL | Freq: Two times a day (BID) | OROMUCOSAL | Status: DC
  Administered 2021-07-24 – 2021-07-25 (×4): 15 mL via OROMUCOSAL

## 2021-07-23 MED ORDER — FUROSEMIDE 10 MG/ML IJ SOLN
60.0000 mg | Freq: Once | INTRAMUSCULAR | Status: AC
  Administered 2021-07-23: 60 mg via INTRAVENOUS
  Filled 2021-07-23: qty 6

## 2021-07-23 MED ORDER — FENTANYL CITRATE (PF) 100 MCG/2ML IJ SOLN: 12.5000 ug | INTRAMUSCULAR | Status: DC | PRN

## 2021-07-23 MED ORDER — SODIUM BICARBONATE 650 MG PO TABS
650.0000 mg | ORAL_TABLET | Freq: Two times a day (BID) | ORAL | Status: DC
  Administered 2021-07-23: 650 mg
  Filled 2021-07-23 (×2): qty 1

## 2021-07-23 NOTE — Progress Notes (Signed)
RT NOTE:  RT called due to pt desaturation and increased WOB. RT placed pt on NIV, pt was unable to tolerate this after about 10 minutes. Pt kept taking the mask off. Pt placed back on HFNC salter at 10L (previously on 6L) and saturations increased to 95-98%. CCMD aware.

## 2021-07-23 NOTE — Progress Notes (Signed)
BP (!) 153/57   Pulse 85   Temp (!) 96.8 F (36 C) (Bladder)   Resp (!) 23   Ht 5\' 2"  (1.575 m)   Wt 69.5 kg   LMP 01/12/2014   SpO2 100%   BMI 28.02 kg/m  Rhonchi-- improved after NTS. No tachypnea or accessory muscle use.  No cough reflex with NTS, but will cough weakly on command  Plan: Needs Panda NGT Starting guafenesin via panda Q4h x 2 days CPT Q4h NTS PRN   Julian Hy, DO 07/23/21 6:37 PM [facility]

## 2021-07-23 NOTE — Progress Notes (Signed)
Bilateral lower extremity venous duplex has been completed. Preliminary results can be found in CV Proc through chart review.   07/23/21 10:28 AM Kayla [name] RVT

## 2021-07-23 NOTE — Progress Notes (Signed)
[place] Progress Note Patient Name: Kayla [name] DOB: 02-26-1961 MRN: [id number]   Date of Service  07/23/2021  HPI/Events of Note  had pulled feeding tube out some, replaced by nurses. Got new KUB that suggests ileus. Tube feed was to run at 45/hr. Potassium at 4.5 Follow AM labs for any electrolyte imbalance.   eICU Interventions  Hold Tube feeding for now Asp precautions.      Intervention Category Intermediate Interventions: Other: (Residuals high)  Elmer Sow 07/23/2021, 11:43 PM

## 2021-07-23 NOTE — Progress Notes (Signed)
Daily Progress Note   Patient Name: Kayla [name]       Date: 07/23/2021 DOB: [birth year]  Age: 60 y.o. MRN#: [id number] Attending Physician: Julian Hy, DO Primary Care Physician: Wayland Denis, MD Admit Date: 07/28/2021  Reason for Consultation/Follow-up: Establishing goals of care  Subjective: I saw [name] following extubation.  She was confused and pulling at her lines.  She is not able to participate in conversation.  I called and spoke with the patient's daughter, Kayla [name].  We discussed clinical course and the fact that she has been extubated.  We also talked about goals moving forward and discussed plan of care and clarification of partial CODE STATUS.  Length of Stay: 7  Current Medications: Scheduled Meds:   amLODipine  10 mg Per Tube Daily   artificial tears   Both Eyes Q8H   chlorhexidine gluconate (MEDLINE KIT)  15 mL Mouth Rinse BID   Chlorhexidine Gluconate Cloth  6 each Topical Daily   docusate  100 mg Per Tube BID   guaiFENesin  5 mL Oral Q4H   insulin aspart  0-15 Units Subcutaneous Q4H   levothyroxine  50 mcg Intravenous Daily   mouth rinse  15 mL Mouth Rinse 10 times per day   methylPREDNISolone (SOLU-MEDROL) injection  57 mg Intravenous Daily   pantoprazole (PROTONIX) IV  40 mg Intravenous Q24H   polyethylene glycol  17 g Per Tube Daily   sodium bicarbonate  650 mg Per Tube BID   sodium chloride flush  10-40 mL Intracatheter Q12H    Continuous Infusions:  clevidipine 4 mg/hr (07/23/21 1336)   dexmedetomidine (PRECEDEX) IV infusion 0.4 mcg/kg/hr (07/23/21 1345)   feeding supplement (VITAL HIGH PROTEIN) Stopped (07/23/21 0813)    PRN Meds: albuterol, fentaNYL (SUBLIMAZE) injection, hydrALAZINE, midazolam, sodium chloride flush  Physical Exam       General: Confused Heart: Regular rate and rhythm.  Abdomen:  nondistended Skin: Warm and dry      Vital Signs: BP (!) 161/70   Pulse 79   Temp (!) 96.8 F (36 C) (Bladder)   Resp 18   Ht _0  (1.575 m)   Wt 69.5 kg   LMP 01/12/2014   SpO2 98%   BMI 28.02 kg/m  SpO2: SpO2: 98 % O2 Device: O2 Device: High Flow Nasal Cannula O2 Flow  Rate: O2 Flow Rate (L/min): 10 L/min  Intake/output summary:  Intake/Output Summary (Last 24 hours) at 07/23/2021 1938 Last data filed at 07/23/2021 1900 Gross per 24 hour  Intake 2604.9 ml  Output 555 ml  Net 2049.9 ml   LBM: Last BM Date: 07/23/21 Baseline Weight: Weight: 57.8 kg Most recent weight: Weight: 69.5 kg       Palliative Assessment/Data:    Flowsheet Rows    Flowsheet Row Most Recent Value  Intake Tab   Referral Department Hospitalist  Unit at Time of Referral ICU  Palliative Care Primary Diagnosis Sepsis/Infectious Disease  Date Notified 07/21/21  Palliative Care Type New Palliative care  Reason for referral Clarify Goals of Care  Date of Admission 07/21/2021  Date first seen by Palliative Care 07/21/21  # of days Palliative referral response time 0 Day(s)  # of days IP prior to Palliative referral 5  Clinical Assessment   Palliative Performance Scale Score 10%  Psychosocial & Spiritual Assessment   Palliative Care Outcomes   Patient/Family meeting held? Yes  Who was at the meeting? Daughter  Palliative Care Outcomes Clarified goals of care       Patient Active Problem List   Diagnosis Date Noted   COVID-19 virus infection    Pressure injury of skin 07/17/2021   Acute respiratory failure with hypoxia ([place])    Renal failure    Anemia 07/11/2021   Counseling regarding goals of care 04/06/2021   Symptomatic anemia 03/17/2021   CKD (chronic kidney disease) stage 5, GFR less than 15 ml/min (HCC) 03/16/2021   Iron deficiency anemia 07/03/2019   Anorexia    Hypercalcemia 11/28/2015   Elevated alkaline  phosphatase level 06/27/2014   Unspecified vitamin D deficiency 04/20/2013   Essential hypertension, benign 02/15/2013   Healthcare maintenance 08/17/2012   Fibromyalgia syndrome 07/21/2012   Hearing loss 02/20/2011   HLD (hyperlipidemia) 03/30/2007   Hypothyroidism, postradioiodine therapy 10/13/2006   DM type 2, uncontrolled, with severe neuropathy 07/31/2006   SICKLE-CELL TRAIT 07/31/2006   Depression 07/31/2006   SHOULDER PAIN 07/31/2006    Palliative Care Assessment & Plan   Patient Profile: 60 y.o. female  with past medical history of hypertension, hyperlipidemia, fibromyalgia, Graves' disease, diabetes, CKD stage V admitted on 07/02/2021 with hypoxemic respiratory failure secondary to COVID-19 and severe anemia status post 5 units packed red blood cells.  She remains intubated and has poor functional status at baseline.  Palliative consulted for goals of care.  Recommendations/Plan: No CPR in event of cardiac arrest.  Per my discussion with daughter, family is open to all the interventions including reintubation, defibrillation, or any ACLS medications. Daughter reports today that they would be open to trial of dialysis if it were necessary and offered.  She iterated again that the only limit of care would be no CPR.  Notes from nephrology indicate she would be a poor candidate for dialysis.   Continue current care and follow clinical course.  While I am going off service, I will ask another member of the palliative medicine team to check in later this week. Please call if there are other palliative specific needs in the interim.  Code Status:    Code Status Orders  (From admission, onward)           Start     Ordered   07/17/21 1637  Limited resuscitation (code)  Continuous       Question Answer Comment  In the event of cardiac or respiratory ARREST: Initiate Code  Blue, Call Rapid Response Yes   In the event of cardiac or respiratory ARREST: Perform CPR No   In the  event of cardiac or respiratory ARREST: Perform Intubation/Mechanical Ventilation Yes   In the event of cardiac or respiratory ARREST: Use NIPPV/BiPAp only if indicated Yes   In the event of cardiac or respiratory ARREST: Administer ACLS medications if indicated Yes   In the event of cardiac or respiratory ARREST: Perform Defibrillation or Cardioversion if indicated Yes      07/17/21 1636           Code Status History     Date Active Date Inactive Code Status Order ID Comments User Context   07/18/2021 2014 07/17/2021 1636 Full Code [id number]  Collier Bullock, MD ED   03/17/2021 0226 03/20/2021 0419 Full Code [id number]  Marianna Payment, MD Inpatient   04/26/2019 1425 05/06/2019 2053 Partial Code [id number]  Neva Seat, MD Inpatient   06/27/2014 1654 07/06/2014 1945 Full Code [id number]  Cresenciano Genre Inpatient   08/17/2012 1829 08/18/2012 1632 Full Code [id number]  Tonia Brooms, MD Inpatient       Prognosis: Guarded  Discharge Planning: To Be Determined  Care plan was discussed with daughter, bedside RN, Dr. Carlis Abbott  Thank you for allowing the Palliative Medicine Team to assist in the care of this patient.   Total Time 40 Prolonged Time Billed No      Greater than 50%  of this time was spent counseling and coordinating care related to the above assessment and plan.  Micheline Rough, MD  Please contact Palliative Medicine Team phone at [phone] for questions and concerns.

## 2021-07-23 NOTE — Progress Notes (Signed)
NAME:  Kayla [name], MRN:  [id number], DOB:  07/29/1961, LOS: 7 ADMISSION DATE:  07/29/2021, CONSULTATION DATE:  07/22/2021 REFERRING MD:  Darl Householder - EDP  CHIEF COMPLAINT:  AMS, SOB  History of Present Illness:  60 year old woman who presented to [facility] ED 10/17 with AMS x 1 day. PMHx significant for HTN, HLD, T2DM, CKD stage V (baseline 7.0 02/2021), Graves disease, fibromyalgia with chronic pain.  On arrival to ED, patient was found to be hypoxic, hypothermic and anemic with Hgb 3.0. Transfused 5U PRBCs. She was subsequently intubated in the ED. With c/f UGIB in the setting of profound anemia, PPI gtt was initiated though FOBT was negative. CXR demonstrated bilateral lower lung airspace disease (L > R). Broad spectrum antibiotic coverage was initiated. Additionally, patient was COVID positive and treatment was initiated.  Pertinent Medical History:  Arthritis Hx of cellulitis COPD CKD 5 - not on dialysis Deafness (R ear) T2DM  Peripheral neuropathy Fibromyalgia Hld HTN Hypothyroidism Graves disease -> hypothyroidism (untreated) Chronic met acidosis?   Significant Hospital Events: Including procedures, antibiotic start and stop dates in addition to other pertinent events   10/17 - Admitted with pneumonia, sepsis, anemia, and possible myxedema coma - intubated, transfused 5U PRBC, COVID +, MRSA PCR +.  Empiric vanc/cefepime/flagyl. Remdesivir. BCx 10/17 growing diphtheroids (10/23) 10/18 - Agitated on WUA, Hgb stable, changed to limited code, no CPR.  TTE with LVEF 50%, severe LVH, G2DD, LVH + RVH with bullseye LV longitudinal strain pattern concerning for cardiac amyloidosis. Antibiotics narrowed to ceftriaxone/azithromycin 10/19 - Bite block placed for clenching teeth with protruding tongue starting to swell, agitated. Continued issues with agitation whenever sedation weaned. Failed SBT. 10/21 - No acute events overnight. Agitated upon waking. Son at bedside. Fentanyl 100, Precedex 0.7. Net  10 L/24H, 671m UOP. Failed SBT. Cr 5.17. 10/22 - CXR with COVID infiltrates.  Cr improved to 4s after high-dose Lasix.  Remains on the vent, FiO2 30%.  Fentanyl/Precedex, scheduled PO/VT oxycodone and clonazepam.  On tube feeds. +11.6 L since admission on Lasix 870mTID. PMT consult - no CPR. No HD. 10/23 - Loletha Grayern Precedex. Fentanyl PRN. Continues on high-dose Lasix. Bcx 10/17 +diphtheroids  Interim History / Subjective:  Sedated, slight attempts to open eyes this AM Has been unable to wean/complete SBT 2/2 agitation on sedation weaning Otherwise making progress Wean sedation and attempt extubation today Extubate to BiPAP, ideally Son at bedside  Objective:  Blood pressure (!) 156/71, pulse (!) 55, temperature 97.7 F (36.5 C), resp. rate 15, height _0  (1.575 m), weight 69.5 kg, last menstrual period 01/12/2014, SpO2 100 %.    Vent Mode: PRVC FiO2 (%):  [30 %-60 %] 50 % Set Rate:  [15 bmp] 15 bmp Vt Set:  [400 mL] 400 mL PEEP:  [5 cmH20] 5 cmH20 Pressure Support:  [15 cmH20] 15 cmH20 Plateau Pressure:  [19 cmH20-26 cmH20] 21 cmH20   Intake/Output Summary (Last 24 hours) at 07/23/2021 0748 Last data filed at 07/23/2021 0657 Gross per 24 hour  Intake 883.36 ml  Output 1130 ml  Net -246.64 ml    Filed Weights   07/21/21 0500 07/22/21 0500 07/23/21 0500  Weight: 66.6 kg 70.6 kg 69.5 kg   Estimated body mass index is 28.02 kg/m as calculated from the following:   Height as of this encounter: _1  (1.575 m).   Weight as of this encounter: 69.5 kg.  Physical Examination: General: Chronically ill-appearing middle-aged woman, sedated, in NAD. HEENT: [place]/AT, anicteric sclera, pupils round  and equal 67m, sluggishly reactive to light bilaterally. Exophthalmos noted. Moist mucous membranes. ETT in place. Neuro: Sedated. Eyelids flutter to voice, no significant response on sedation. Not following commands. Moves all 4 extremities spontaneously. +Cough and +Gag  CV: RRR, no  m/g/r. PULM: Breathing even and unlabored on vent (PEEP 5, FiO2 40%). Lung fields with coarse rhonchi throughout. GI: Soft, nontender, nondistended. Normoactive bowel sounds. Extremities: Trace symmetric LE edema noted. Skin: Warm/dry, no rashes.  Resolved Hospital Problem List:     Assessment & Plan:   Acute hypoxemic respirtaory failure, severe requiring intubation in the setting of COVID-19 - POA COVID-19 infection Possible superimposed bacterial PNA - Wean to extubate today, 10/24; patient's agitation with decreased sedation has been the main barrier to extubation; plan to attempt - Wean FiO2 for O2 sat > 90% - VAP bundle - Pulmonary hygiene - PAD protocol for sedation: Precedex and Fentanyl for goal RASS 0 to -1 - S/p remdesivir x 5 days - Continue steroids, taper 10/25 - Empiric ceftriaxone/azithromycin to end today, 121/30 Acute metabolic encephalopathy - POA Agitation Likely multifactorial in the setting of critical illness, hypoxia, ?med adherence. CT Head 10/17 NAICA, mild diffuse atrophy/mild chronic small vessel ischemic changes, R sphenoid sinusitis. - Precedex infusion for goal RASS 0 to -1 - Versed PRN - Wean as able to support extubation - Ensure adequate pain control   Chronic pain, opiate-dependent Fibromyalgia Chronic depression Home regimen: Effexor, Dilaudid, ASA/Caffeine, Tylenol - Continue PAD Protocol (Precedex, Fentanyl) - Ensure adequate pain control - Resume home medications as clinically appropriate  Hypertension Home regimen includes hydralazine, amlodipine. - Cleviprex titrated to goal SBP < 160 - Continue amlodipine, hydralazine  Acute-on-chronic diastolic heart failure Abnormal echocardiogram Concern for cardiac amyloidosis Baseline Echo 02/2021 with severe LVH, grade 1 DD, hyperdynamic RV, immobile/fixed L coronary cusp. Echo 10/18 similar but progression to grade 2 DD with bullseye LV longitudinal strain c/f cardiac amyloidosis. -  Diuresis as tolerated - Cardiac MRI when feasible, may be most appropriate as outpatient - Cardiac monitoring/tele  MRSA PCR positive - POA Diphtheroid positive Bcx on admission Afebrile on admission, PCT elevated (take into account CKD). Bcx 10/17 positive for diphtheroids. - Ceftriaxone course complete 10/24 - F/u finalized Cx data  Chronic kidney disease stage V HD not recommended this admission by Nephrology, given multiple baseline comorbidities/debility, poor medical compliance. Daughter (works in hospice) agrees with no chronic HD (10/22) - Appreciate Nephrology input - F/u vasculitis workup - Trend BMP - Replete electrolytes as indicated - Diuresis as tolerated - Monitor I&Os - Avoid nephrotoxic agents as able - Ensure adequate renal perfusion  Severe protein calorie malnutrition, POA Severe low albumin 3.1 and 2.7 on admission.  - Nutrition consulted, appreciate assistance - Continue TF  Profound anemia with active bleeding, POA Anemia of chronic disease 2/2 CKD Thrombocytopenia, POA Hgb 3.0 on admission, s/p 5U PRBCs. Concern for possible UGIB, doubtful, OGT output bilious, stool hemoccult - negative. On Aranesp at baseline. - Monitor for signs/symptoms of active bleeding - HIT panel negative - Trend H&H, Plt - Transfuse for Hgb < 7.0  T2DM, insulin-dependent HgbA1C was 5.4 in 03/2021. Home regimen includes: Glargine 20U daily.  - CBG Q4H - SSI - Resume home medications as appropriate  Hx of Graves diseases Untreated Hypothyroidism with decompensation Patient hypothermic, bradycardic and acutely encephalopathic on admission. Cortisol >100.  TSH 140, Free T4 <0.25. - Continue levothyroxine  Coccyx and right heel - tissue pressure injury, POA Stage 2 sacral decub - present  on admit (per RN, daughter denies)  Failure to thrive - POA - WOC consult  [place] - Per Palliative Care Consult - 07/21/21: No CPR and no chronic dialysis, but continue full medical care.  -  Ongoing goals and further clarification of Partial Code status [currently desiring intubation, pressors, defibrillation and ACLS/chemical code in the case of an arrest]  Best Practice: (right click and "Reselect all SmartList Selections" daily)  Diet/type: NPO; TF DVT prophylaxis: not indicated - thrombocytopenia GI prophylaxis: PPI Lines: N/A - PICC Foley:  Yes, and it is no longer needed Code Status:  limited- no CPR Last date of multidisciplinary goals of care discussion: Son updated 10/24 at bedside.   Critical care time: 45 minutes   Lestine Mount, [place] [facility] 07/23/21 1:30 PM  Please see [email or web address] for pager details.  From 7A-7P if no response, please call [phone] After hours, please call ELink [phone]

## 2021-07-23 NOTE — Procedures (Signed)
Extubation Procedure Note  Patient Details:   Name: Kayla [name] DOB: 01/03/1961 MRN: [id number]   Airway Documentation:    Vent end date: 07/23/21 Vent end time: 1111   Evaluation  O2 sats: stable throughout Complications: No apparent complications Patient did tolerate procedure well. Bilateral Breath Sounds: Diminished, Rhonchi   No  Johnette Abraham 07/23/2021, 11:11 AM

## 2021-07-23 NOTE — Progress Notes (Signed)
[place] Progress Note Patient Name: Lexis Potenza DOB: [birth year] MRN: [id number]   Date of Service  07/23/2021  HPI/Events of Note  KUB reviewed.  eICU Interventions  NG/OG Panda in place.      Intervention Category Minor Interventions: Other: (NG placement)  Elmer Sow 07/23/2021, 8:57 PM

## 2021-07-23 NOTE — Progress Notes (Signed)
1750 Pt's oxygen dropping. Pt unable to clear secretions. HFNC increased from 6-15L. Pt's O2 still around 88-90%. Respiratory called. Attempted to place pt on BiPap, pt refusing pt let us try. Pt could not tolerate, pt kept taking off Bipap while RN and RT in room monitoring. MD called and to bedside, NTS suction performed by Dr. Carlis Abbott, sats now around high 90s. CPT started by RT, WCTM.

## 2021-07-24 ENCOUNTER — Inpatient Hospital Stay (HOSPITAL_COMMUNITY): Payer: Self-pay

## 2021-07-24 DIAGNOSIS — D62 Acute posthemorrhagic anemia: Secondary | ICD-10-CM

## 2021-07-24 DIAGNOSIS — I10 Essential (primary) hypertension: Secondary | ICD-10-CM

## 2021-07-24 DIAGNOSIS — G9341 Metabolic encephalopathy: Secondary | ICD-10-CM

## 2021-07-24 DIAGNOSIS — N185 Chronic kidney disease, stage 5: Secondary | ICD-10-CM

## 2021-07-24 LAB — GLUCOSE, CAPILLARY
Glucose-Capillary: 116 mg/dL — ABNORMAL HIGH (ref 70–99)
Glucose-Capillary: 61 mg/dL — ABNORMAL LOW (ref 70–99)
Glucose-Capillary: 67 mg/dL — ABNORMAL LOW (ref 70–99)
Glucose-Capillary: 73 mg/dL (ref 70–99)
Glucose-Capillary: 83 mg/dL (ref 70–99)
Glucose-Capillary: 85 mg/dL (ref 70–99)
Glucose-Capillary: 97 mg/dL (ref 70–99)
Glucose-Capillary: 97 mg/dL (ref 70–99)

## 2021-07-24 LAB — BLOOD GAS, ARTERIAL
Acid-base deficit: 9.9 mmol/L — ABNORMAL HIGH (ref 0.0–2.0)
Bicarbonate: 16 mmol/L — ABNORMAL LOW (ref 20.0–28.0)
Delivery systems: POSITIVE
Expiratory PAP: 5
FIO2: 100
Inspiratory PAP: 15
Mode: POSITIVE
O2 Saturation: 98.9 %
Patient temperature: 98.4
pCO2 arterial: 38.4 mmHg (ref 32.0–48.0)
pH, Arterial: 7.249 — ABNORMAL LOW (ref 7.350–7.450)
pO2, Arterial: 206 mmHg — ABNORMAL HIGH (ref 83.0–108.0)

## 2021-07-24 LAB — POCT I-STAT 7, (LYTES, BLD GAS, ICA,H+H)
Acid-base deficit: 8 mmol/L — ABNORMAL HIGH (ref 0.0–2.0)
Bicarbonate: 18.3 mmol/L — ABNORMAL LOW (ref 20.0–28.0)
Calcium, Ion: 1.25 mmol/L (ref 1.15–1.40)
HCT: 31 % — ABNORMAL LOW (ref 36.0–46.0)
Hemoglobin: 10.5 g/dL — ABNORMAL LOW (ref 12.0–15.0)
O2 Saturation: 98 %
Patient temperature: 97.4
Potassium: 4.7 mmol/L (ref 3.5–5.1)
Sodium: 141 mmol/L (ref 135–145)
TCO2: 19 mmol/L — ABNORMAL LOW (ref 22–32)
pCO2 arterial: 36.8 mmHg (ref 32.0–48.0)
pH, Arterial: 7.301 — ABNORMAL LOW (ref 7.350–7.450)
pO2, Arterial: 117 mmHg — ABNORMAL HIGH (ref 83.0–108.0)

## 2021-07-24 LAB — BASIC METABOLIC PANEL
Anion gap: 18 — ABNORMAL HIGH (ref 5–15)
BUN: 132 mg/dL — ABNORMAL HIGH (ref 6–20)
CO2: 17 mmol/L — ABNORMAL LOW (ref 22–32)
Calcium: 9.2 mg/dL (ref 8.9–10.3)
Chloride: 107 mmol/L (ref 98–111)
Creatinine, Ser: 4.55 mg/dL — ABNORMAL HIGH (ref 0.44–1.00)
GFR, Estimated: 11 mL/min — ABNORMAL LOW (ref >60)
Glucose, Bld: 90 mg/dL (ref 70–99)
Potassium: 4.8 mmol/L (ref 3.5–5.1)
Sodium: 142 mmol/L (ref 135–145)

## 2021-07-24 LAB — CBC
HCT: 30.9 % — ABNORMAL LOW (ref 36.0–46.0)
Hemoglobin: 11 g/dL — ABNORMAL LOW (ref 12.0–15.0)
MCH: 31.3 pg (ref 26.0–34.0)
MCHC: 35.6 g/dL (ref 30.0–36.0)
MCV: 88 fL (ref 80.0–100.0)
Platelets: 60 10*3/uL — ABNORMAL LOW (ref 150–400)
RBC: 3.51 MIL/uL — ABNORMAL LOW (ref 3.87–5.11)
RDW: 17.9 % — ABNORMAL HIGH (ref 11.5–15.5)
WBC: 22.1 10*3/uL — ABNORMAL HIGH (ref 4.0–10.5)
nRBC: 0.2 % (ref 0.0–0.2)

## 2021-07-24 LAB — MAGNESIUM: Magnesium: 2.5 mg/dL — ABNORMAL HIGH (ref 1.7–2.4)

## 2021-07-24 LAB — PHOSPHORUS: Phosphorus: 8.8 mg/dL — ABNORMAL HIGH (ref 2.5–4.6)

## 2021-07-24 MED ORDER — FUROSEMIDE 10 MG/ML IJ SOLN
120.0000 mg | Freq: Once | INTRAVENOUS | Status: AC
  Administered 2021-07-24: 120 mg via INTRAVENOUS
  Filled 2021-07-24: qty 12

## 2021-07-24 MED ORDER — VALPROATE SODIUM 100 MG/ML IV SOLN
250.0000 mg | Freq: Two times a day (BID) | INTRAVENOUS | Status: DC
  Administered 2021-07-24 – 2021-07-25 (×3): 250 mg via INTRAVENOUS
  Filled 2021-07-24 (×5): qty 2.5

## 2021-07-24 MED ORDER — PIPERACILLIN-TAZOBACTAM IN DEX 2-0.25 GM/50ML IV SOLN
2.2500 g | Freq: Four times a day (QID) | INTRAVENOUS | Status: DC
  Administered 2021-07-24 – 2021-07-25 (×6): 2.25 g via INTRAVENOUS
  Filled 2021-07-24 (×8): qty 50

## 2021-07-24 MED ORDER — NEPRO/CARBSTEADY PO LIQD
1000.0000 mL | ORAL | Status: DC
  Filled 2021-07-24: qty 1000

## 2021-07-24 MED ORDER — SODIUM BICARBONATE 8.4 % IV SOLN
INTRAVENOUS | Status: DC
  Filled 2021-07-24 (×2): qty 150

## 2021-07-24 MED ORDER — DEXTROSE 50 % IV SOLN: 12.5000 g | INTRAVENOUS | Status: AC

## 2021-07-24 MED ORDER — DEXTROSE 50 % IV SOLN
INTRAVENOUS | Status: AC
  Administered 2021-07-24: 12.5 g via INTRAVENOUS
  Filled 2021-07-24: qty 50

## 2021-07-24 MED ORDER — THIAMINE HCL 100 MG/ML IJ SOLN
100.0000 mg | Freq: Every day | INTRAMUSCULAR | Status: DC
  Administered 2021-07-24 – 2021-07-25 (×2): 100 mg via INTRAVENOUS
  Filled 2021-07-24 (×2): qty 2

## 2021-07-24 NOTE — Progress Notes (Signed)
[place] Progress Note Patient Name: Lavanna Rog DOB: [birth year] MRN: [id number]   Date of Service  07/24/2021  HPI/Events of Note  Slightly more arousable off Precedex. ABG = 7.249/38.4/206/16. Patient pulled out gastric tube X 2. Not able to take PO HCO3- tablets.  eICU Interventions  Plan: Increase D5 NaHCO3 IV infusion to 50 mL/hour.     Intervention Category Major Interventions: Acid-Base disturbance - evaluation and management;Change in mental status - evaluation and management  Shalla Bulluck Cornelia Copa 07/24/2021, 11:47 PM

## 2021-07-24 NOTE — Progress Notes (Signed)
RT obtained ABG on pt while on NIV/PCV/BIPAP w/the following results. RT will continue to monitor.   Results for Kayla [name], Kayla [name] (MRN [id number]) as of 07/24/2021 06:43  Ref. Range 07/24/2021 06:36  Sample type Unknown ARTERIAL  pH, Arterial Latest Ref Range: 7.350 - 7.450  7.301 (L)  pCO2 arterial Latest Ref Range: 32.0 - 48.0 mmHg 36.8  pO2, Arterial Latest Ref Range: 83.0 - 108.0 mmHg 117 (H)  TCO2 Latest Ref Range: 22 - 32 mmol/L 19 (L)  Acid-base deficit Latest Ref Range: 0.0 - 2.0 mmol/L 8.0 (H)  Bicarbonate Latest Ref Range: 20.0 - 28.0 mmol/L 18.3 (L)  O2 Saturation Latest Units: % 98.0  Patient temperature Unknown 97.4 F  Collection site Unknown Radial

## 2021-07-24 NOTE — Progress Notes (Signed)
RT called to bedside for pt desaturating into the 70's. Pt placed on NRB by RN w/minimal advancement/improvement of SPO2. RT suctioned pts airway and placed on BIPAP/NIV/PCV with settings of 15/5 BUR 15 FIO2 100%. RT contact [place] and notified her of pts change in respiratory status. RT will continue to monitor.

## 2021-07-24 NOTE — Progress Notes (Signed)
Pharmacy Antibiotic Note  Kayla [name] is a 60 y.o. female admitted on 07/13/2021.  She previously completed a 7 day course of empiric antibiotics for pneumonia.  She was extubated on 10/24 and had suspected aspiration on 10/24.  Pharmacy has been consulted for Zosyn dosing.  Plan: Zosyn 2.25 g IV q6h Follow up renal function, culture results, and clinical course.   Height: 5\' 2"  (157.5 cm) Weight: 67.4 kg (148 lb 9.4 oz) IBW/kg (Calculated) : 50.1  Temp (24hrs), Avg:97.1 F (36.2 C), Min:96.8 F (36 C), Max:97.4 F (36.3 C)  Recent Labs  Lab 07/20/21 0304 07/21/21 0530 07/22/21 0446 07/23/21 0100 07/23/21 0435 07/24/21 0425  WBC 18.1* 13.9* 10.4 12.9*  --  22.1*  CREATININE 5.17* 4.61* 4.84*  --  4.79* 4.55*  LATICACIDVEN  --   --  1.1  --   --   --     Estimated Creatinine Clearance: 12 mL/min (A) (by C-G formula based on SCr of 4.55 mg/dL (H)).    Allergies  Allergen Reactions   Ace Inhibitors     Dizziness, nausea   Percocet [Oxycodone-Acetaminophen] Itching   Statins Itching   Latex Rash   Sulfamethoxazole Rash   Sulfites Rash    Antimicrobials this admission: 10/17 vancomycin >> 10/18 10/17 cefepime >> 10/18 10/17 Flagyl >> 10/18 10/18 azithromycin >> 10/22 10/18 CTX >>10/24 10/19 Remdesivir >> 10/22 10/25 Zosyn >>   Dose adjustments this admission:   Microbiology results: 10/17 BCx: 1/4 GPRods - diphtheroids (corynebacterium sp) 10/17 MRSA PCR: not detected 10/19 TA: normal flora, final 10/18 strep and legionella: both negative 10/23 UCx: < 10k colonies, insignificant growth   Thank you for allowing pharmacy to be a part of this patient's care.  Gretta Arab PharmD, BCPS Clinical Pharmacist [facility] main pharmacy [phone] 07/24/2021 11:30 AM

## 2021-07-24 NOTE — Progress Notes (Signed)
Nutrition Follow-up  DOCUMENTATION CODES:   Not applicable  INTERVENTION:  - if small bore NGT able to be replaced tomorrow: Nepro @ 45 ml/hr. - this regimen will provide 1944 kcal, 87 grams protein, and 785 ml free water. - free water flush, if desired, to be per CCM.   NUTRITION DIAGNOSIS:   Increased nutrient needs related to acute illness as evidenced by estimated needs. -ongoing  GOAL:   Patient will meet greater than or equal to 90% of their needs -unable to meet at this time  MONITOR:   Diet advancement, Labs, Weight trends, Skin, I & O's  ASSESSMENT:   60 y.o. female history of COPD, fibromyalgia, Graves' disease, diabetes here presenting with altered mental status.  Patient discussed in rounds this AM.   She was extubated yesterday at ~1110. In rounds it was shared that she pulled small bore NGT out x2 yesterday. She remains NPO. Plan to re-attempt small bore NGT placement tomorrow. All meds that are able to be given IV have been changed to this route as RN reports patient not appropriate for anything PO.   Re-estimated nutrition needs s/p extubation.   Weight has been fluctuating throughout hospitalization. Non-pitting edema to BUE and mild pitting edema to BLE documented in the edema section of flow sheet.   Orders remain in place for Vital High Protein @ 45 ml/hr which provides 1080 kcal, 94 grams protein, and 903 ml free water.    Patient with AKI on stage 5 CKD. No plan for dialysis. Strict I/Os.    Labs reviewed; CBGs: 116, 97, 61, 97, 67 mg/dl, BUN: 132 mg/dl, creatinine: 4.55 mg/dl, Phos: 8.8 mg/dl, Mg: 2.5 mg/dl, GFR: 11 ml/min. K is WDL.   Medications reviewed; 100 mg colace BID, 120 mg IV lasix x1 dose 10/25, sliding scale novolog, 50 mcg IV synthroid/day, 57 mg solu-medrol/day, 40 mg IV protonix/day, 17 g miralax/day, 650 mg sodium bicarb BID, 100 mg IV thiamine/day.     Diet Order:   Diet Order             Diet NPO time specified  Diet  effective now                   EDUCATION NEEDS:   Not appropriate for education at this time  Skin:  Skin Assessment: Skin Integrity Issues: Skin Integrity Issues:: Stage II, DTI DTI: right heel Stage II: medial coccyx  Last BM:  10/24  Height:   Ht Readings from Last 1 Encounters:  07/17/21 5\' 2"  (1.575 m)    Weight:   Wt Readings from Last 1 Encounters:  07/24/21 67.4 kg     Estimated Nutritional Needs:  Kcal:  1850-2120 kcal Protein:  85-95g Fluid:  >/= 1.7 L/day      Jarome Matin, MS, RD, LDN, CNSC Inpatient Clinical Dietitian RD pager # available in [place]  After hours/weekend pager # available in [facility]

## 2021-07-24 NOTE — Progress Notes (Signed)
[facility] Progress Note Patient Name: Kayla [name] DOB: [birth year] MRN: [id number]   Date of Service  07/24/2021  HPI/Events of Note  Overnight Resp status worsened. On BiPAP. TF aspiration? As per RT assessment, Camera eval done.  Tolerating biPAP. Sats 100%. VS stable. Worsening leukocytosis, on ceftrioxne. Covid +.  eICU Interventions  Discussed with RN. - get CxR and an ABG.  Aspiration precautions Consider changing antibiotics to zosyn for asp pneumonia.      Intervention Category Intermediate Interventions: Respiratory distress - evaluation and management  Elmer Sow 07/24/2021, 5:53 AM

## 2021-07-24 NOTE — Progress Notes (Signed)
NAME:  Kayla [name], MRN:  [id number], DOB:  09-25-1961, LOS: 8 ADMISSION DATE:  07/12/2021, CONSULTATION DATE:  07/28/2021 REFERRING MD:  Darl Householder - EDP  CHIEF COMPLAINT:  AMS, SOB  History of Present Illness:  60 year old woman who presented to [facility] ED 10/17 with AMS x 1 day. PMHx significant for HTN, HLD, T2DM, CKD stage V (baseline 7.0 02/2021), Graves disease, fibromyalgia with chronic pain.  On arrival to ED, patient was found to be hypoxic, hypothermic and anemic with Hgb 3.0. Transfused 5U PRBCs. She was subsequently intubated in the ED. With c/f UGIB in the setting of profound anemia, PPI gtt was initiated though FOBT was negative. CXR demonstrated bilateral lower lung airspace disease (L > R). Broad spectrum antibiotic coverage was initiated. Additionally, patient was COVID positive and treatment was initiated.  Pertinent Medical History:  Arthritis Hx of cellulitis COPD CKD 5 - not on dialysis Deafness (R ear) T2DM  Peripheral neuropathy Fibromyalgia Hld HTN Hypothyroidism Graves disease -> hypothyroidism (untreated) Chronic met acidosis?   Significant Hospital Events: Including procedures, antibiotic start and stop dates in addition to other pertinent events   10/17 - Admitted with pneumonia, sepsis, anemia, and possible myxedema coma - intubated, transfused 5U PRBC, COVID +, MRSA PCR +.  Empiric vanc/cefepime/flagyl. Remdesivir. BCx 10/17 growing diphtheroids (10/23) 10/18 - Agitated on WUA, Hgb stable, changed to limited code, no CPR.  TTE with LVEF 50%, severe LVH, G2DD, LVH + RVH with bullseye LV longitudinal strain pattern concerning for cardiac amyloidosis. Antibiotics narrowed to ceftriaxone/azithromycin 10/19 - Bite block placed for clenching teeth with protruding tongue starting to swell, agitated. Continued issues with agitation whenever sedation weaned. Failed SBT. 10/21 - No acute events overnight. Agitated upon waking. Son at bedside. Fentanyl 100, Precedex 0.7. Net  10 L/24H, 625m UOP. Failed SBT. Cr 5.17. 10/22 - CXR with COVID infiltrates.  Cr improved to 4s after high-dose Lasix.  Remains on the vent, FiO2 30%.  Fentanyl/Precedex, scheduled PO/VT oxycodone and clonazepam.  On tube feeds. +11.6 L since admission on Lasix 821mTID. PMT consult - no CPR. No HD. 10/23 - Loletha Grayern Precedex. Fentanyl PRN. Continues on high-dose Lasix. Bcx 10/17 +diphtheroids 10/24 - Agitated when weaning sedation; not tolerating wean 2/2 ETT. Extubated to BiPAP. Tolerated well initially, when more awake pulling BiPAP off. Sats > 95% on Sal[place]. Overnight, desat to 70s c/f aspiration event. TF held. 10/25 - Remains delirious, mildly agitated, occasionally attempts to remove BiPAP. NTS with moderate yellow-white secretions. O2 sats 94-96% on RA during NTS, 100% on BiPAP.  Interim History / Subjective:  Remains delirious, mildly agitated this morning Occasionally trying to remove BiPAP Continues to state "I can't breathe" Not oriented on mental status exam Daughter, [name], at bedside NTS with moderate yellow-white secretions this AM O2 sats remain WNL Per RN, bladder scan > 50043mvernight, I&O cath with only 36m76moley placed without significant output Suspect bladder scanner picking up abdominal free fluid vs. liquid bowel contents in the setting of ileus  Objective:  Blood pressure (!) 138/57, pulse (!) 58, temperature (!) 97.4 F (36.3 C), temperature source Axillary, resp. rate (!) 22, height '5\' 2"'  (1.575 m), weight 67.4 kg, last menstrual period 01/12/2014, SpO2 100 %.    Vent Mode: PCV;BIPAP FiO2 (%):  [40 %-100 %] 100 % Set Rate:  [15 bmp] 15 bmp Vt Set:  [400 mL] 400 mL PEEP:  [5 cmH20] 5 cmH20 Plateau Pressure:  [23 cmH20] 23 cmH20   Intake/Output Summary (Last 24 hours)  at 07/24/2021 0717 Last data filed at 07/24/2021 0540 Gross per 24 hour  Intake 2286.79 ml  Output 625 ml  Net 1661.79 ml    Filed Weights   07/22/21 0500 07/23/21 0500 07/24/21 0436   Weight: 70.6 kg 69.5 kg 67.4 kg   Physical Examination: General: Chronically ill-appearing middle-aged woman in NAD. HEENT: [place]/AT, anicteric sclera, PERRL 2.37m, exophthalmos noted. Moist mucous membranes. Neuro:  Delirious, at times agitated/others lethargic.  Responds to verbal stimuli. Following commands intermittently. Moves all 4 extremities spontaneously. +Cough and +Gag  CV: Mildly bradycardic to 50s, regular rhythm, no m/g/r. PULM: Breathing even and unlabored on BiPAP. Lung fields with scattered rhonchi, improved from previous exam. GI: Soft, nontender, mildly distended. Hypoactive bowel sounds. Extremities: Trace bilateral symmetric LE edema noted. Skin: Warm/dry, no rashes.  Resolved Hospital Problem List:     Assessment & Plan:   Acute hypoxemic respirtaory failure, severe requiring intubation in the setting of COVID-19 - POA COVID-19 infection Possible superimposed bacterial PNA - Continue BiPAP support PRN + QHS - Wean supplemental O2/FiO2 for O2 sat > 90% - NTS as needed for secretions - Pulmonary hygiene - PAD protocol for sedation: Precedex for goal RASS 0 to -1 - S/p remdesivir x 5-day course - Continue steroid taper - Zosyn for ?aspiration event 120/25KY Acute metabolic encephalopathy - POA Agitation Likely multifactorial in the setting of critical illness, hypoxia, ?med adherence. CT Head 10/17 NAICA, mild diffuse atrophy/mild chronic small vessel ischemic changes, R sphenoid sinusitis. - Precedex infusion for goal RASS 0 to -1 - Versed PRN for agitation - Wean sedation as able to support mental status - Ensure adequate pain control  Chronic pain, opiate-dependent Fibromyalgia Chronic depression Home regimen: Effexor, Dilaudid, ASA/Caffeine, Tylenol - Continue PAD Protocol (Predecex, Versed) - Ensure adequate pain control - Resume home meds as clinically appropriate  Hypertension Home regimen includes hydralazine, amlodipine. - Cleviprex titrated  to goal SBP < 160 - Continue amlodipine, hydral  Acute-on-chronic diastolic heart failure Abnormal echocardiogram Concern for cardiac amyloidosis Baseline Echo 02/2021 with severe LVH, grade 1 DD, hyperdynamic RV, immobile/fixed L coronary cusp. Echo 10/18 similar but progression to grade 2 DD with bullseye LV longitudinal strain c/f cardiac amyloidosis. - Diuresis as tolerated - Cardiac MRI when feasible, may be most appropriate as outpatient - Cardiac monitoring/telemetry  MRSA PCR positive - POA Diphtheroid positive Bcx on admission C/f aspiration event Afebrile on admission, PCT elevated (take into account CKD). Bcx 10/17 positive for diphtheroids. - Ceftriaxone course complete 10/24 - F/u finalized Cx data - Zosyn x 7-day course for possible aspiration event 10/24PM  Chronic kidney disease stage V HD not recommended this admission by Nephrology, given multiple baseline comorbidities/debility, poor medical compliance. Daughter (works in hospice) agrees with no chronic HD (10/22) - Appreciate Nephrology input, patient is not a dialysis candidate - Trend BMP - Replete electrolytes as indicated - Monitor I&Os - Avoid nephrotoxic agents as able - Ensure adequate renal perfusion - F/u vasculitis workup  Severe protein calorie malnutrition, POA Severe low albumin 3.1 and 2.7 on admission.  - Nutrition consulted, appreciate assistance - TF held in the setting of AMS, ?aspiration 10/24PM - Consider NGT/Panda replacement   Profound anemia with active bleeding, POA Anemia of chronic disease 2/2 CKD Thrombocytopenia, POA Hgb 3.0 on admission, s/p 5U PRBCs. Concern for possible UGIB, doubtful, OGT output bilious, stool hemoccult - negative. On Aranesp at baseline. HIT panel negative. - Monitor for signs/symptoms of active bleeding - Trend H&H, Plt - Transfuse  for Hgb < 7.0  T2DM, insulin-dependent HgbA1C was 5.4 in 03/2021. Home regimen includes: Glargine 20U daily.  - CBGs Q4H -  SSI - Resume home medications as appropriate  Hx of Graves diseases Untreated Hypothyroidism with decompensation Patient hypothermic, bradycardic and acutely encephalopathic on admission. Cortisol >100.  TSH 140, Free T4 <0.25. - Continue levothyroxine  Coccyx and right heel - tissue pressure injury, POA Stage 2 sacral decub - present on admit (per RN, daughter denies)  Failure to thrive - POA - WOC consult  [place] - Per Palliative Care Consult - 07/21/21: No CPR and no chronic dialysis, but continue full medical care.  - Ongoing goals and further clarification of Partial Code status [currently desiring intubation, pressors, defibrillation and ACLS/chemical code in the case of an arrest]  Best Practice: (right click and "Reselect all SmartList Selections" daily)  Diet/type: NPO; TF DVT prophylaxis: not indicated - thrombocytopenia GI prophylaxis: PPI Lines: N/A - PICC Foley:  Yes, and it is still needed Code Status:  limited- no CPR Last date of multidisciplinary goals of care discussion: Son/daughter updated 10/25 at bedside.   Critical care time: 40 minutes   Lestine Mount, [place] [facility] 07/24/21 7:17 AM  Please see [email or web address] for pager details.  From 7A-7P if no response, please call [phone] After hours, please call ELink [phone]

## 2021-07-24 NOTE — Progress Notes (Addendum)
[place] Progress Note Patient Name: Kayla [name] DOB: [birth year] MRN: [id number]   Date of Service  07/24/2021  HPI/Events of Note  Patient minimally responsive. Being placed on BiPAP by RT. Currently on a Precedex IV infusion. Sat = 100%.  eICU Interventions  Plan: Hold Precedex.  ABG STAT.     Intervention Category Major Interventions: Change in mental status - evaluation and management  [place] 07/24/2021, 10:49 PM

## 2021-07-24 NOTE — Progress Notes (Signed)
SLP Cancellation Note  Patient Details Name: Kayla [name] MRN: [id number] DOB: [birth year]   Cancelled treatment:       Reason Eval/Treat Not Completed: Medical issues which prohibited therapy  Unable to complete BSE at this time, as pt is currently requiring BiPAP. She "remains delirious, mildly agitated " per PA. Will continue efforts.  Kayla [name] B. Quentin Ore, [facility], [place] Speech Language Pathologist Office: [phone]  [name] 07/24/2021, 10:18 AM

## 2021-07-24 NOTE — Progress Notes (Signed)
[place] Progress Note Patient Name: Kayla [name] DOB: 04/07/1961 MRN: [id number]   Date of Service  07/24/2021  HPI/Events of Note  not voiding since foley removal this evening at 1800.  bladder scan shows >500cc. attempted an in and out cath x2 with only 30cc out.  asking for indwelling foley  eICU Interventions  Ordered foley     Intervention Category Intermediate Interventions: Other: (Urinary rtenstion)  Elmer Sow 07/24/2021, 3:43 AM

## 2021-07-25 DIAGNOSIS — J1282 Pneumonia due to coronavirus disease 2019: Secondary | ICD-10-CM

## 2021-07-25 DIAGNOSIS — Z66 Do not resuscitate: Secondary | ICD-10-CM

## 2021-07-25 DIAGNOSIS — Z515 Encounter for palliative care: Secondary | ICD-10-CM

## 2021-07-25 LAB — ANTI-DNA ANTIBODY, DOUBLE-STRANDED: ds DNA Ab: <1 IU/mL (ref 0–9)

## 2021-07-25 LAB — CYCLIC CITRUL PEPTIDE ANTIBODY, IGG/IGA: CCP Antibodies IgG/IgA: 5 units (ref 0–19)

## 2021-07-25 LAB — BASIC METABOLIC PANEL
Anion gap: >20 — ABNORMAL HIGH (ref 5–15)
BUN: 134 mg/dL — ABNORMAL HIGH (ref 6–20)
CO2: 15 mmol/L — ABNORMAL LOW (ref 22–32)
Calcium: 8.8 mg/dL — ABNORMAL LOW (ref 8.9–10.3)
Chloride: 101 mmol/L (ref 98–111)
Creatinine, Ser: 4.61 mg/dL — ABNORMAL HIGH (ref 0.44–1.00)
GFR, Estimated: 10 mL/min — ABNORMAL LOW (ref >60)
Glucose, Bld: 109 mg/dL — ABNORMAL HIGH (ref 70–99)
Potassium: 5 mmol/L (ref 3.5–5.1)
Sodium: 138 mmol/L (ref 135–145)

## 2021-07-25 LAB — MAGNESIUM: Magnesium: 2.6 mg/dL — ABNORMAL HIGH (ref 1.7–2.4)

## 2021-07-25 LAB — GLUCOSE, CAPILLARY
Glucose-Capillary: 104 mg/dL — ABNORMAL HIGH (ref 70–99)
Glucose-Capillary: 109 mg/dL — ABNORMAL HIGH (ref 70–99)
Glucose-Capillary: 122 mg/dL — ABNORMAL HIGH (ref 70–99)
Glucose-Capillary: 111 mg/dL — ABNORMAL HIGH (ref 70–99)

## 2021-07-25 LAB — GLOMERULAR BASEMENT MEMBRANE ANTIBODIES: GBM Ab: <0.2 units (ref 0.0–0.9)

## 2021-07-25 LAB — ANCA TITERS
Atypical P-ANCA titer: <1:20 {titer}
C-ANCA: <1:20 {titer}
P-ANCA: <1:20 {titer}

## 2021-07-25 LAB — RHEUMATOID FACTOR: Rheumatoid fact SerPl-aCnc: <10 IU/mL (ref <14.0)

## 2021-07-25 LAB — ANTI-SCLERODERMA ANTIBODY: Scleroderma (Scl-70) (ENA) Antibody, IgG: <0.2 AI (ref 0.0–0.9)

## 2021-07-25 LAB — ANTINUCLEAR ANTIBODIES, IFA: ANA Ab, IFA: NEGATIVE

## 2021-07-25 LAB — PHOSPHORUS: Phosphorus: 9.1 mg/dL — ABNORMAL HIGH (ref 2.5–4.6)

## 2021-07-25 LAB — HEPARIN INDUCED PLATELET AB (HIT ANTIBODY): Heparin Induced Plt Ab: 0.09 OD (ref 0.000–0.400)

## 2021-07-25 MED ORDER — MORPHINE SULFATE (PF) 2 MG/ML IV SOLN
INTRAVENOUS | Status: AC
  Filled 2021-07-25: qty 1

## 2021-07-25 MED ORDER — MORPHINE SULFATE (PF) 2 MG/ML IV SOLN
1.0000 mg | Freq: Once | INTRAVENOUS | Status: AC
  Administered 2021-07-25: 1 mg via INTRAVENOUS

## 2021-07-25 MED ORDER — MIDAZOLAM HCL 2 MG/2ML IJ SOLN: 1.0000 mg | INTRAMUSCULAR | Status: DC | PRN

## 2021-07-25 MED ORDER — CLONIDINE HCL 0.1 MG/24HR TD PTWK
0.1000 mg | MEDICATED_PATCH | TRANSDERMAL | Status: DC
  Administered 2021-07-25: 0.1 mg via TRANSDERMAL
  Filled 2021-07-25: qty 1

## 2021-07-31 NOTE — Progress Notes (Signed)
Death Note: Patient's HR had been trending down over the last several hours, this RN called patient's daughter [name] to bedside. Patient's son also at bedside. Patient was made a DNR several hrs prior, and was taken off Bipap at approximately 1820 per family's request. 1 mg morphine was given IVP per CCM orders for comfort. Patient's O2 sat and HR continued to decline until patient expired at 1846. Family was at bedside, as well as Noe Gens, critical care NP at time of death. Patient was pronounced by this RN and Noe Gens, NP. Patient was surrounded by her children, chaplain, and nursing staff. Honor bridge called by [employer]. Night shift RN has been given report and will continue to provide support to patient's family.

## 2021-07-31 NOTE — Progress Notes (Addendum)
NAME:  Kayla [name], MRN:  [id number], DOB:  04/23/1961, LOS: 9 ADMISSION DATE:  07/29/2021, CONSULTATION DATE:  07/24/2021 REFERRING MD:  Darl Householder - EDP  CHIEF COMPLAINT:  AMS, SOB  History of Present Illness:  60 year old woman who presented to [facility] ED 10/17 with AMS x 1 day. PMHx significant for HTN, HLD, T2DM, CKD stage V (baseline 7.0 02/2021), Graves disease, fibromyalgia with chronic pain.  On arrival to ED, patient was found to be hypoxic, hypothermic and anemic with Hgb 3.0. Transfused 5U PRBCs. She was subsequently intubated in the ED. With c/f UGIB in the setting of profound anemia, PPI gtt was initiated though FOBT was negative. CXR demonstrated bilateral lower lung airspace disease (L > R). Broad spectrum antibiotic coverage was initiated. Additionally, patient was COVID positive and treatment was intubated.  AKI on CKD, family expressed no HD (daughter is hospice LPN).  Extubated 10/24 to BiPAP.    Pertinent Medical History:  Arthritis Hx of cellulitis COPD CKD 5 - not on dialysis Deafness (R ear) T2DM  Peripheral neuropathy Fibromyalgia Hld HTN Hypothyroidism Graves disease -> hypothyroidism (untreated) Chronic met acidosis?   Significant Hospital Events: Including procedures, antibiotic start and stop dates in addition to other pertinent events   10/17 Admitted with pneumonia, sepsis, anemia, and possible myxedema coma - intubated, transfused 5U PRBC, COVID +, MRSA PCR +.  Empiric vanc/cefepime/flagyl. Remdesivir. BCx 10/17 growing diphtheroids (10/23) 10/18 Agitated on WUA, Hgb stable, changed to limited code, no CPR.  TTE with LVEF 50%, severe LVH, G2DD, LVH + RVH with bullseye LV longitudinal strain pattern concerning for cardiac amyloidosis. Antibiotics narrowed to ceftriaxone/azithromycin 10/19 Bite block placed for clenching teeth with protruding tongue starting to swell, agitated. Continued issues with agitation whenever sedation weaned. Failed SBT. 10/21 No acute  events overnight. Agitated upon waking. Son at bedside. Fentanyl 100, Precedex 0.7. Net 10 L/24H, 640m UOP. Failed SBT. Cr 5.17. 10/22 CXR with COVID infiltrates.  Cr improved to 4s after high-dose Lasix.  Remains on the vent, FiO2 30%.  Fentanyl/Precedex, scheduled PO/VT oxycodone and clonazepam.  On tube feeds. +11.6 L since admission on Lasix 847mTID. PMT consult - no CPR. No HD. 10/23 BrLoletha Grayern Precedex. Fentanyl PRN. Continues on high-dose Lasix. Bcx 10/17 +diphtheroids 10/24 Agitated when weaning sedation; not tolerating wean 2/2 ETT. Extubated to BiPAP. Tolerated well initially, when more awake pulling BiPAP off. Sats > 95% on Sal[place]. Overnight, desat to 70s c/f aspiration event. TF held. 10/25 Remains delirious, mildly agitated, occasionally attempts to remove BiPAP. NTS with moderate yellow-white secretions. O2 sats 94-96% on RA during NTS, 100% on BiPAP.  Interim History / Subjective:  Afebrile On BiPAP support, 80% FiO2 > O2 reduced to 55% Pt remains on cleviprex   Objective:  Blood pressure (!) 158/128, pulse 69, temperature 97.6 F (36.4 C), temperature source Oral, resp. rate 17, height '5\' 2"'  (1.575 m), weight 68.2 kg, last menstrual period 01/12/2014, SpO2 100 %.    Vent Mode: BIPAP;PCV FiO2 (%):  [80 %-100 %] 80 % Set Rate:  [15 bmp] 15 bmp PEEP:  [5 cmH20] 5 cmH20   Intake/Output Summary (Last 24 hours) at 1011/19/2022806 Last data filed at 1019-Nov-2022800 Gross per 24 hour  Intake 1207.25 ml  Output 995 ml  Net 212.25 ml   Filed Weights   07/23/21 0500 07/24/21 0436 10Nov 19, [id number]  Weight: 69.5 kg 67.4 kg 68.2 kg   Physical Examination: General: adult female lying in bed in NAD on BiPAP HEENT: MM pink/dry,  exophthalmus /peri orbital edema, anicteric  Neuro: awakens to voice, makes eye contact, squeezes hands to commands, moves spontaneously but does not follow other commands  CV: s1s2 RRR, no m/r/g PULM:  non-labored on BiPAP, rhonchi bilaterally  GI: soft, bsx4  active  Extremities: warm/dry, no edema  Skin: no rashes or lesions  Resolved Hospital Problem List:     Assessment & Plan:   Acute hypoxemic respirtaory failure, severe requiring intubation in the setting of COVID-19 - POA COVID-19 infection Possible superimposed bacterial PNA Completed remdesivir.   -BiPAP support QHS & PRN  -NTS PRN  -pulmonary hygiene -IS, mobilize  -solumedrol, taper steroids to off  -zosyn for possible aspiration event on 10/24 PM, S8/5   Acute metabolic encephalopathy - POA Agitated Delirium  Likely multifactorial in the setting of critical illness, hypoxia, ?med adherence. CT Head 10/17 NAICA, mild diffuse atrophy/mild chronic small vessel ischemic changes, R sphenoid sinusitis. -precedex stopped overnight due to decreased mental status, remove from [facility] -discontinue versesd PRN  -promote sleep / wake cycle  -pain control   Chronic pain, opiate-dependent Fibromyalgia Chronic depression Home regimen: Effexor, Dilaudid, ASA/Caffeine, Tylenol -continue PRN fentanyl  -resume home regimen when able   Hypertension Home regimen includes hydralazine, amlodipine. -wean Cleviprex for SBP<160 -add clonidine patch given no oral access (pt pulling out gastric tubes, aspirated on TF) -hold PT amlodipine   Acute-on-chronic diastolic heart failure Abnormal echocardiogram Concern for cardiac amyloidosis Baseline Echo 02/2021 with severe LVH, grade 1 DD, hyperdynamic RV, immobile/fixed L coronary cusp. Echo 10/18 similar but progression to grade 2 DD with bullseye LV longitudinal strain c/f cardiac amyloidosis. -assess daily for diuresis, evolemia goal  -tele monitoring  -will need cardiac MRI as outpatient  MRSA PCR positive - POA Diphtheroid positive Bcx on admission C/f aspiration event Afebrile on admission, PCT elevated (take into account CKD). Bcx 10/17 positive for diphtheroids, likely contaminant. -D3/7 zosyn   Chronic kidney disease stage V HD not  recommended this admission by Nephrology, given multiple baseline comorbidities/debility, poor medical compliance. Daughter (works in hospice) agrees with no chronic HD (10/22) -Appreciate Nephrology input, patient is not an HD candiate  -worrisome clinical trend with rising BUN/Cr on bicarbonate  -Trend BMP / urinary output -Replace electrolytes as indicated -Avoid nephrotoxic agents, ensure adequate renal perfusion -SSA/SSB, CCP< SCL70, RF, dsDNA, GBM, ANCA, ANA negative  Severe protein calorie malnutrition, POA Severe low albumin 3.1 and 2.7 on admission.  -Nutrition orders removed given pt pulling NGT out, aspiration event  -consider replacement & monitor before restarting TF  Profound anemia with active bleeding, POA Anemia of chronic disease 2/2 CKD Thrombocytopenia, POA Hgb 3.0 on admission, s/p 5U PRBCs. Concern for possible UGIB, doubtful, OGT output bilious, stool hemoccult negative. On Aranesp at baseline. HIT panel negative. -follow CBC, platelets  -transfuse for Hgb <7%  T2DM, insulin-dependent HgbA1C was 5.4 in 03/2021. Home regimen includes: Glargine 20U daily.  -CBG Q4 -SSI -resume home meds as clinically appropriate  Hx of Graves diseases Untreated Hypothyroidism with decompensation Patient hypothermic, bradycardic and acutely encephalopathic on admission. Cortisol >100.  TSH 140, Free T4 <0.25. -continue levothyroxine   Coccyx and right heel - tissue pressure injury, POA Stage 2 sacral decub - present on admit (per RN, daughter denies)  Failure to thrive - POA -WOC consult   [place] -Per Palliative Care Consult - 07/21/21: No CPR and no chronic dialysis, but continue full medical care.  Ongoing goals and further clarification of Partial Code status - currently desiring intubation, pressors,  defibrillation and ACLS/chemical code in the case of an arrest.  Best Practice: (right click and "Reselect all SmartList Selections" daily)  Diet/type: NPO DVT prophylaxis:  not indicated - thrombocytopenia GI prophylaxis: PPI Lines: N/A - PICC Foley:  Yes, and it is still needed Code Status:  limited- no CPR Last date of multidisciplinary goals of care discussion: Will update family this afternoon, further discussion regarding renal failure.    Critical care time: 107 minutes    Noe Gens, MSN, APRN, NP-C, AGACNP-BC [place] [place] [facility] 08-15-2021, 8:06 AM   Please see [email or web address] for pager details.   From 7A-7P if no response, please call [phone] After hours, please call ELink [phone]

## 2021-07-31 NOTE — Evaluation (Signed)
SLP Cancellation Note  Patient Details Name: Geisha Abernathy MRN: [id number] DOB: 03-Dec-1960   Cancelled treatment:       Reason Eval/Treat Not Completed: Other (comment) (spoke to RN earlier, pt was on Bipap, will continue efforts)  Request RN contact SLP if pt appropriate for evaluation today.    Kathleen Lime, MS [facility] SLP Acute Rehab Services Office [phone] Pager [phone]  Macario Golds 08/13/2021, 2:34 PM

## 2021-07-31 NOTE — Progress Notes (Signed)
Patient family requested bands be removed from patient and be given to them prior to leaving the hospital. Notified by patient placement that patient would be a medical examiners case. Notified medical examiner at 2100 of patient case, ME declined because cause of death was not related to the reason she was referred. Patient placement notified of this by medical examiner. Patient was cleaned, all foams, and lines were removed. PICC site continues to bleed due to patient status prior to death. Gauze placed on site with tape to hold in place. Patient placed in body bag with toe tag and labels on bag. Notified transport of patient status. Awaiting transport.

## 2021-07-31 NOTE — Death Summary Note (Signed)
DEATH SUMMARY   Patient Details  Name: Kayla [name] MRN: [id number] DOB: 1961-06-10  Admission/Discharge Information   Admit Date:  2021/07/18  Date of Death: Date of Death: 07-27-2021  Time of Death: Time of Death: 1846  Length of Stay: 09-Dec-2022  Referring Physician: Wayland Denis, MD   Reason(s) for Hospitalization   Acute encephalopathy Acute respiratory failure with hypoxia  Diagnoses  Preliminary cause of death:  Secondary Diagnoses (including complications and co-morbidities):  Active Problems:   Anemia   Pressure injury of skin   Acute respiratory failure with hypoxia (HCC)   Renal failure   COVID-19 virus infection   Comfort measures only status   DNR (do not resuscitate) Covid 19 pneumonia Myxedema coma, history of Grave's disease History of COPD DM, type 2 Hypertensive emergency Peripheral neuropathy Fibromyalgia, chronic anemia Hyperlipidemia Severe anemia due to acute blood loss Acute thrombocytopenia AKI on CKD V Acute on chronic HFpEF Concern for cardiac amyloidosis Acute metabolic encephalopathy Agitated delirium Aspiration , dysphagia Severe protein energy malnutrition Pressure injury to coccyx and R heel Sacral decubitus ulcer Failure to thrive   Brief Hospital Course (including significant findings, care, treatment, and services provided and events leading to death)  Kayla [name] is a 60 y.o. year old female who presented to [facility] ED 2023/07/19 with AMS x 1 day. PMHx significant for HTN, HLD, T2DM, CKD stage V (baseline 7.0 02/2021), Graves disease, fibromyalgia with chronic pain.   On arrival to ED, patient was found to be hypoxic, hypothermic and anemic with Hgb 3.0. Transfused 5U PRBCs. She was subsequently intubated in the ED. With c/f UGIB in the setting of profound anemia, PPI gtt was initiated though FOBT was negative. CXR demonstrated bilateral lower lung airspace disease (L > R). Broad spectrum antibiotic coverage was initiated. Additionally, patient  was COVID positive and treatment was intubated.  AKI on CKD, family expressed no HD (daughter is hospice LPN).  Extubated 10/24 to BiPAP.    She was treated with empiric antibiotics, remdesivir, steroids, mechanical ventilation. Agitation limited her progression with vent weaning, but she was able to extubated on 10/24, but continued to have agitation that complicated her care. She had dysphagia and self-removed her NGT, leading to an aspiration episode. She developed progressive renal failure and uncontrolled hypertension this admission. Ultimately she developed hemodynamic instability with significant bradycardia and her children changed her code status to DNR. She passed away shortly after with family at bedside.  Pertinent Labs and Studies  Significant Diagnostic Studies DG Abd 1 View  Result Date: 07/23/2021 CLINICAL DATA:  NG tube EXAM: ABDOMEN - 1 VIEW COMPARISON:  07/23/2021 FINDINGS: Esophageal tube tip overlies the proximal stomach. Airspace disease and effusions at the lung bases. Mild increased small and large bowel gas suggestive of ileus. IMPRESSION: Esophageal tube tip overlies the proximal stomach Electronically Signed   By: Donavan Foil M.D.   On: 07/23/2021 23:28   DG Abd 1 View  Result Date: 07/23/2021 CLINICAL DATA:  Feeding tube placement EXAM: ABDOMEN - 1 VIEW COMPARISON:  X-ray dated July 22, 2021 FINDINGS: Interval placement of enteric feeding tube with tip projecting over the expected area of the stomach. Interval removal of NG tube. Prominent gas-filled loops of colon partially visualized. Partially visualized lung bases demonstrate small bilateral pleural effusions and bibasilar atelectasis. Right arm PICC tip is in the right atrium. No acute osseous abnormality. IMPRESSION: Interval placement of enteric feeding tube with tip projecting over the expected area of the stomach. Electronically Signed   By:  Yetta Glassman M.D.   On: 07/23/2021 20:32   DG Abd 1  View  Result Date: 07/22/2021 CLINICAL DATA:  Confirm endotracheal tube and OG tube. EXAM: ABDOMEN - 1 VIEW COMPARISON:  07/22/2021 FINDINGS: Endotracheal tube is in place, tip approximately 6 centimeters above the carina. Nasogastric tube is in place, tip coiled in the stomach. RIGHT PICC line tip overlies the level of the LOWER superior vena cava or RIGHT atrium. The heart is enlarged, stable in configuration. There are patchy opacities at the lung bases bilaterally, partially obscuring hemidiaphragms and consistent with atelectasis or infiltrate. Gas-filled loops in the UPPER abdomen are likely colon and stomach. IMPRESSION: Interval placement of endotracheal tube. Bibasilar opacities consistent with atelectasis or infiltrates. Electronically Signed   By: Nolon Nations M.D.   On: 07/22/2021 16:04   CT HEAD WO CONTRAST (5MM)  Result Date: 07/21/2021 CLINICAL DATA:  Found down. EXAM: CT HEAD WITHOUT CONTRAST TECHNIQUE: Contiguous axial images were obtained from the base of the skull through the vertex without intravenous contrast. COMPARISON:  MRI brain 08/17/2012. FINDINGS: Brain: No evidence of acute infarction, hemorrhage, hydrocephalus, extra-axial collection or mass lesion/mass effect. Again seen is mild diffuse atrophy. There is mild periventricular white matter hypodensity which likely represents chronic small vessel ischemic change, similar to prior. Vascular: Atherosclerotic calcifications are present within the cavernous internal carotid arteries. Skull: Normal. Negative for fracture or focal lesion. Sinuses/Orbits: There secretions in the nasopharynx. There is opacification of bilateral mastoid air cells. There is an air-fluid level in the right sphenoid sinus. Other: Peripheral vascular calcifications are seen in the soft tissues. IMPRESSION: 1. No acute intracranial abnormality. 2. Mild diffuse atrophy and mild chronic small vessel ischemic change. 3. Right sphenoid sinusitis. 4. Bilateral  mastoid effusions. Electronically Signed   By: Ronney Asters M.D.   On: 07/19/2021 18:35   DG CHEST PORT 1 VIEW  Result Date: 07/24/2021 CLINICAL DATA:  Decreased oxygen sats EXAM: PORTABLE CHEST 1 VIEW COMPARISON:  07/23/2021 FINDINGS: Interval extubation. Central venous line remains. Stable cardiac silhouette. Low lung volumes. RIGHT basilar atelectasis. IMPRESSION: Low lung volumes and RIGHT basilar atelectasis. Interval extubation. Electronically Signed   By: Suzy Bouchard M.D.   On: 07/24/2021 09:47   DG CHEST PORT 1 VIEW  Result Date: 07/23/2021 CLINICAL DATA:  Follow-up bibasilar atelectasis EXAM: PORTABLE CHEST 1 VIEW COMPARISON:  Chest radiograph 07/22/2021 FINDINGS: The endotracheal tube is in similar position at the level of the clavicular heads. The enteric catheter tip and sidehole project over the stomach. A right upper extremity PICC tip terminates at the level of the cavoatrial junction. Lung volumes are significantly low. Aeration of the right upper lung is slightly improved, while patchy bibasilar opacities are overall similar to the study from 1 day prior. There are suspected small bilateral pleural effusions. The left upper lobe remains aerated there is no pneumothorax. The bones are stable. IMPRESSION: Improved aeration of the right upper lung since 07/22/2021. Persistent bibasilar opacities are not significantly changed. Electronically Signed   By: Valetta Mole M.D.   On: 07/23/2021 08:11   DG CHEST PORT 1 VIEW  Result Date: 07/22/2021 CLINICAL DATA:  Endotracheal tube placement. EXAM: PORTABLE CHEST 1 VIEW COMPARISON:  07/22/2021 FINDINGS: Endotracheal tube is in place, tip approximately 7.3 centimeters above the carina. Nasogastric tube is coiled, tip within the region of the gastric fundus. RIGHT-sided PICC line tip overlies the level of the LOWER superior vena cava/RIGHT atrium. Stable appearance of the heart. Bilateral LOWER lobe opacities partially  obscures the  hemidiaphragm, consistent with atelectasis or consolidation. No edema. IMPRESSION: Repositioned endotracheal tube. Bibasilar atelectasis or consolidations, stable. Electronically Signed   By: Nolon Nations M.D.   On: 07/22/2021 16:45   DG CHEST PORT 1 VIEW  Result Date: 07/21/2021 CLINICAL DATA:  Respiratory failure. EXAM: PORTABLE CHEST 1 VIEW COMPARISON:  07/20/2021 FINDINGS: ET tube tip is above the carina. NG tube tip and side port are below the GE junction. Right arm PICC line tip is in the right atrium. Stable cardiomediastinal contours. Left lower lobe airspace consolidation with air bronchograms is unchanged. Bilateral patchy areas of discoid atelectasis noted within the mid and lower lung zones as before. IMPRESSION: 1. No change in aeration to the left lower lobe. 2. Stable support apparatus. Electronically Signed   By: Kerby Moors M.D.   On: 07/21/2021 08:16   DG CHEST PORT 1 VIEW  Result Date: 07/20/2021 CLINICAL DATA:  Acute respiratory failure.  Ventilator. EXAM: PORTABLE CHEST 1 VIEW COMPARISON:  07/17/2021 FINDINGS: Endotracheal tube approximately 3 cm above the carina. NG tube coiled in the stomach with the tip in the fundus. Right arm PICC in the region of the cavoatrial junction. Cardiac enlargement. Vascular congestion with bilateral airspace disease. Mild improvement in aeration since the prior study. Bibasilar atelectasis. Small left effusion. IMPRESSION: Right arm PICC tip near the cavoatrial junction. Cardiac enlargement with diffuse bilateral airspace disease. Possible edema or pneumonia. Improved aeration since the prior study. Electronically Signed   By: Franchot Gallo M.D.   On: 07/20/2021 12:32   DG Chest Port 1 View  Result Date: 07/17/2021 CLINICAL DATA:  Respiratory failure EXAM: PORTABLE CHEST 1 VIEW COMPARISON:  07/30/2021 FINDINGS: Markedly rotated AP portable examination. Within this limitation, no significant change, endotracheal and esophagogastric tubes in  appropriate position. Bibasilar atelectasis and or consolidation. Cardiomegaly. IMPRESSION: 1. Markedly rotated AP portable examination. Within this limitation, no significant change, endotracheal and esophagogastric tubes in appropriate position. 2. Cardiomegaly with bibasilar atelectasis and or consolidation. No new airspace opacity. Electronically Signed   By: Delanna Ahmadi M.D.   On: 07/17/2021 13:07   DG Chest Port 1 View  Result Date: 07/23/2021 CLINICAL DATA:  Questionable sepsis. EXAM: PORTABLE CHEST 1 VIEW COMPARISON:  Chest x-ray 03/16/2021. FINDINGS: Endotracheal tube tip is 4 cm above the carina. Enteric tube is coiled in the stomach with distal tip near the gastric fundus. The heart is enlarged, unchanged. There are bilateral lower lung airspace opacities, left greater than right. There is no pleural effusion or pneumothorax. No acute fractures are seen. IMPRESSION: 1. Endotracheal tube tip 4 cm above carina. 2. Enteric tube is coiled in the stomach with distal tip near the fundus. Consider repositioning. 3. Bilateral lower lung airspace disease, left greater than right. 4. Stable cardiomegaly. Electronically Signed   By: Ronney Asters M.D.   On: 07/09/2021 18:32   DG Chest Port 1V same Day  Result Date: 07/22/2021 CLINICAL DATA:  Follow-up chest x-ray.  Respiratory failure. EXAM: PORTABLE CHEST 1 VIEW COMPARISON:  07/21/2021 FINDINGS: Right arm PICC line tip is in the right atrium. ET tube tip is above the carina. There is an NG tube with tip in side port below GE junction. Stable cardiomediastinal contours. Dense retrocardiac opacification within the left lung base is again noted concerning for pneumonia. Atelectasis within the right midlung and right lung base is unchanged. IMPRESSION: 1. No change in aeration to the lungs compared with previous exam. 2. Stable support apparatus. Electronically Signed   By:  Kerby Moors M.D.   On: 07/22/2021 08:39   ECHOCARDIOGRAM COMPLETE  Result  Date: 07/17/2021    ECHOCARDIOGRAM REPORT   Patient Name:   JAMIYA Nofsinger Date of Exam: 07/17/2021 Medical Rec #:  [id number]    Height:       62.0 in Accession #:    [id number]   Weight:       127.4 lb Date of Birth:  Jan 23, 1961    BSA:          1.578 m Patient Age:    60 years     BP:           116/54 mmHg Patient Gender: F            HR:           49 bpm. Exam Location:  Inpatient Procedure: 2D Echo, Cardiac Doppler and Color Doppler Indications:    CHF  History:        Patient has prior history of Echocardiogram examinations. CHF.  Sonographer:    NA Referring Phys: [id number] [place]  1. Left ventricular ejection fraction, by estimation, is 50%. The left ventricle has mildly decreased function. The left ventricle demonstrates global hypokinesis. There is severe left ventricular hypertrophy. Left ventricular diastolic parameters are consistent with Grade II diastolic dysfunction (pseudonormalization). The average left ventricular global longitudinal strain is -9.1 %. The global longitudinal strain is abnormal. Longitudinal strain at apex is preserved in bullseye pattern (can see with cardiac amyloidosis).  2. Right ventricular systolic function is normal. The right ventricular size is normal. Moderately increased right ventricular wall thickness. There is normal pulmonary artery systolic pressure. The estimated right ventricular systolic pressure is 48.2 mmHg.  3. Left atrial size was moderately dilated.  4. The mitral valve is normal in structure. Trivial mitral valve regurgitation. No evidence of mitral stenosis.  5. The aortic valve is tricuspid. Aortic valve regurgitation is not visualized. Mild to moderate aortic valve sclerosis/calcification is present, without any evidence of aortic stenosis.  6. The inferior vena cava is dilated in size with <50% respiratory variability, suggesting right atrial pressure of 15 mmHg.  7. LVH and RVH with bullseye LV longitudinal strain pattern are  concerning for cardiac amyloidosis. FINDINGS  Left Ventricle: Left ventricular ejection fraction, by estimation, is 50%. The left ventricle has mildly decreased function. The left ventricle demonstrates global hypokinesis. The average left ventricular global longitudinal strain is -9.1 %. The global longitudinal strain is abnormal. The left ventricular internal cavity size was normal in size. There is severe left ventricular hypertrophy. Left ventricular diastolic parameters are consistent with Grade II diastolic dysfunction (pseudonormalization). Right Ventricle: The right ventricular size is normal. Moderately increased right ventricular wall thickness. Right ventricular systolic function is normal. There is normal pulmonary artery systolic pressure. The tricuspid regurgitant velocity is 2.06 m/s, and with an assumed right atrial pressure of 15 mmHg, the estimated right ventricular systolic pressure is 70.7 mmHg. Left Atrium: Left atrial size was moderately dilated. Right Atrium: Right atrial size was normal in size. Pericardium: Trivial pericardial effusion is present. Mitral Valve: The mitral valve is normal in structure. There is mild calcification of the mitral valve leaflet(s). Mild mitral annular calcification. Trivial mitral valve regurgitation. No evidence of mitral valve stenosis. Tricuspid Valve: The tricuspid valve is normal in structure. Tricuspid valve regurgitation is mild. Aortic Valve: The aortic valve is tricuspid. Aortic valve regurgitation is not visualized. Mild to moderate aortic valve sclerosis/calcification is present, without any evidence of  aortic stenosis. Pulmonic Valve: The pulmonic valve was normal in structure. Pulmonic valve regurgitation is not visualized. Aorta: The aortic root is normal in size and structure. Venous: The inferior vena cava is dilated in size with less than 50% respiratory variability, suggesting right atrial pressure of 15 mmHg. IAS/Shunts: No atrial level shunt  detected by color flow Doppler.  LEFT VENTRICLE PLAX 2D LVIDd:         3.30 cm Diastology LVIDs:         2.40 cm LV e' medial:    3.60 cm/s LV PW:         1.70 cm LV E/e' medial:  29.7 LV IVS:        1.70 cm LV e' lateral:   4.80 cm/s                        LV E/e' lateral: 22.3                         2D Longitudinal Strain                        2D Strain GLS Avg:     -9.1 % RIGHT VENTRICLE          IVC RV Basal diam:  3.20 cm  IVC diam: 2.20 cm LEFT ATRIUM             Index        RIGHT ATRIUM           Index LA diam:        3.60 cm 2.28 cm/m   RA Area:     17.70 cm LA Vol (A2C):   72.5 ml 45.93 ml/m  RA Volume:   45.50 ml  28.83 ml/m LA Vol (A4C):   66.4 ml 42.07 ml/m LA Biplane Vol: 71.1 ml 45.05 ml/m   AORTA Ao Root diam: 3.00 cm MITRAL VALVE                TRICUSPID VALVE MV Area (PHT): 3.53 cm     TR Peak grad:   17.0 mmHg MV Decel Time: 215 msec     TR Vmax:        206.00 cm/s MV E velocity: 107.00 cm/s MV A velocity: 56.10 cm/s MV E/A ratio:  1.91 Dalton McleanMD Electronically signed by Franki Monte Signature Date/Time: 07/17/2021/4:20:53 PM    Final    VAS [place] LOWER EXTREMITY VENOUS (DVT)  Result Date: 07/23/2021  Lower Venous DVT Study Patient Name:  MARYFRANCES PORTUGAL  Date of Exam:   07/23/2021 Medical Rec #: [id number]     Accession #:    [id number] Date of Birth: 05/07/1961     Patient Gender: F Patient Age:   63 years Exam Location[facility] Procedure:      VAS [place] LOWER EXTREMITY VENOUS (DVT) Referring Phys: MURALI RAMASWAMY --------------------------------------------------------------------------------  Indications: Edema.  Risk Factors: COVID 19 positive. Limitations: Poor ultrasound/tissue interface, body habitus and patient positioning, patient movement. Comparison Study: No prior studies. Performing Technologist: Oliver Hum RVT  Examination Guidelines: A complete evaluation includes B-mode imaging, spectral Doppler, color Doppler, and power Doppler as needed of all  accessible portions of each vessel. Bilateral testing is considered an integral part of a complete examination. Limited examinations for reoccurring indications may be performed as noted. The reflux portion of the exam is performed with the patient in  reverse Trendelenburg.  +---------+---------------+---------+-----------+----------+--------------+ RIGHT    CompressibilityPhasicitySpontaneityPropertiesThrombus Aging +---------+---------------+---------+-----------+----------+--------------+ CFV      Full           Yes      Yes                                 +---------+---------------+---------+-----------+----------+--------------+ SFJ      Full                                                        +---------+---------------+---------+-----------+----------+--------------+ FV Prox  Full                                                        +---------+---------------+---------+-----------+----------+--------------+ FV Mid   Full                                                        +---------+---------------+---------+-----------+----------+--------------+ FV DistalFull                                                        +---------+---------------+---------+-----------+----------+--------------+ PFV      Full                                                        +---------+---------------+---------+-----------+----------+--------------+ POP      Full           Yes      Yes                                 +---------+---------------+---------+-----------+----------+--------------+ PTV      Full                                                        +---------+---------------+---------+-----------+----------+--------------+ PERO     Full                                                        +---------+---------------+---------+-----------+----------+--------------+   +---------+---------------+---------+-----------+----------+--------------+  LEFT     CompressibilityPhasicitySpontaneityPropertiesThrombus Aging +---------+---------------+---------+-----------+----------+--------------+ CFV      Full           Yes      Yes                                 +---------+---------------+---------+-----------+----------+--------------+  SFJ      Full                                                        +---------+---------------+---------+-----------+----------+--------------+ FV Prox  Full                                                        +---------+---------------+---------+-----------+----------+--------------+ FV Mid   Full                                                        +---------+---------------+---------+-----------+----------+--------------+ FV Distal               Yes      Yes                                 +---------+---------------+---------+-----------+----------+--------------+ PFV      Full                                                        +---------+---------------+---------+-----------+----------+--------------+ POP      Full           Yes      Yes                                 +---------+---------------+---------+-----------+----------+--------------+ PTV      Full                                                        +---------+---------------+---------+-----------+----------+--------------+ PERO     Full                                                        +---------+---------------+---------+-----------+----------+--------------+     Summary: RIGHT: - There is no evidence of deep vein thrombosis in the lower extremity. However, portions of this examination were limited- see technologist comments above.  - No cystic structure found in the popliteal fossa.  LEFT: - There is no evidence of deep vein thrombosis in the lower extremity. However, portions of this examination were limited- see technologist comments above.  - No cystic structure found in the  popliteal fossa.  *See table(s) above for measurements and observations. Electronically signed by Monica Martinez MD on 07/23/2021 at 3:55:03 PM.    Final    [place] EKG SITE RITE  Result Date: 07/18/2021 If Site Rite image  not attached, placement could not be confirmed due to current cardiac rhythm.   Microbiology Recent Results (from the past 240 hour(s))  Resp Panel by RT-PCR (Flu A&B, Covid) Nasopharyngeal Swab     Status: Abnormal   Collection Time: 07/06/2021  6:31 PM   Specimen: Nasopharyngeal Swab; Nasopharyngeal(NP) swabs in vial transport medium  Result Value Ref Range Status   SARS Coronavirus 2 by RT PCR POSITIVE (A) NEGATIVE Final    Comment: RESULT CALLED TO, READ BACK BY AND VERIFIED WITH:  BARKSDALE TROY RN 07/18/2021 @ 2256 VS (NOTE) SARS-CoV-2 target nucleic acids are DETECTED.  The SARS-CoV-2 RNA is generally detectable in upper respiratory specimens during the acute phase of infection. Positive results are indicative of the presence of the identified virus, but do not rule out bacterial infection or co-infection with other pathogens not detected by the test. Clinical correlation with patient history and other diagnostic information is necessary to determine patient infection status. The expected result is Negative.  Fact Sheet for Patients: EntrepreneurPulse.com.au  Fact Sheet for Healthcare Providers: IncredibleEmployment.be  This test is not yet approved or cleared by the [place] FDA and  has been authorized for detection and/or diagnosis of SARS-CoV-2 by FDA under an Emergency Use Authorization (EUA).  This EUA will remain in effect (meaning this test ca n be used) for the duration of  the COVID-19 declaration under Section 564(b)(1) of the Act, 21 U.S.C. section 360bbb-3(b)(1), unless the authorization is terminated or revoked sooner.     Influenza A by PCR NEGATIVE NEGATIVE Final   Influenza B by PCR NEGATIVE  NEGATIVE Final    Comment: (NOTE) The Xpert Xpress SARS-CoV-2/FLU/RSV plus assay is intended as an aid in the diagnosis of influenza from Nasopharyngeal swab specimens and should not be used as a sole basis for treatment. Nasal washings and aspirates are unacceptable for Xpert Xpress SARS-CoV-2/FLU/RSV testing.  Fact Sheet for Patients: EntrepreneurPulse.com.au  Fact Sheet for Healthcare Providers: IncredibleEmployment.be  This test is not yet approved or cleared by the [place] FDA and has been authorized for detection and/or diagnosis of SARS-CoV-2 by FDA under an Emergency Use Authorization (EUA). This EUA will remain in effect (meaning this test can be used) for the duration of the COVID-19 declaration under Section 564(b)(1) of the Act, 21 U.S.C. section 360bbb-3(b)(1), unless the authorization is terminated or revoked.  Performed at [facility], [place] [address] [place], [place] [address]   Blood Culture (routine x 2)     Status: None   Collection Time: 07/02/2021  6:34 PM   Specimen: Site Not Specified; Blood  Result Value Ref Range Status   Specimen Description   Final    SITE NOT SPECIFIED BLOOD Performed at [facility] [address], [place], [place] [address]    Special Requests   Final    Blood Culture adequate volume BOTTLES DRAWN AEROBIC AND ANAEROBIC Performed at [facility] [address] [place], [place] [address]    Culture   Final    NO GROWTH 5 DAYS Performed at [facility], [place] [address], [place], [place] [address]    Report Status 07/21/2021 FINAL  Final  Blood Culture (routine x 2)     Status: Abnormal   Collection Time: 07/19/2021  6:34 PM   Specimen: BLOOD RIGHT FOREARM  Result Value Ref Range Status   Specimen Description   Final    BLOOD RIGHT FOREARM Performed at [facility], [address], [place], [place] [address]     Special Requests   Final    Blood Culture results may not be optimal due to an excessive volume of blood received in culture bottles BOTTLES DRAWN AEROBIC AND ANAEROBIC Performed at [facility], [place] [address], [place], [place] [address]    Culture  Setup Time   Final    GRAM POSITIVE RODS AEROBIC BOTTLE ONLY CRITICAL RESULT CALLED TO, READ BACK BY AND VERIFIED WITH: E JACKSON,[email or web address]  07/18/21 [place]    Culture (A)  Final    DIPHTHEROIDS(CORYNEBACTERIUM SPECIES) Standardized susceptibility testing for this organism is not available. Performed at [facility], [place] [address], [place], [place] [address]    Report Status 07/21/2021 FINAL  Final  MRSA Next Gen by PCR, Nasal     Status: None   Collection Time: 07/08/2021  9:37 PM   Specimen: Nasal Mucosa; Nasal Swab  Result Value Ref Range Status   MRSA by PCR Next Gen NOT DETECTED NOT DETECTED Final    Comment: (NOTE) The GeneXpert MRSA Assay (FDA approved for NASAL specimens only), is one component of a comprehensive MRSA colonization surveillance program. It is not intended to diagnose MRSA infection nor to guide or monitor treatment for MRSA infections. Test performance is not FDA approved in patients less than 51 years old. Performed at [facility], [place] [address] [place], [place] [address]   Culture, Respiratory w Gram Stain     Status: None   Collection Time: 07/18/21 11:56 AM   Specimen: Tracheal Aspirate; Respiratory  Result Value Ref Range Status   Specimen Description   Final    TRACHEAL ASPIRATE Performed at [facility] [address], [place], [place] [address]    Special Requests   Final    NONE Performed at [facility], [place] [address], [place], [place] [address]    Gram Stain   Final    ABUNDANT WBC PRESENT,BOTH PMN AND MONONUCLEAR RARE GRAM POSITIVE COCCI RARE GRAM VARIABLE ROD    Culture   Final    RARE Normal respiratory  flora-no Staph aureus or Pseudomonas seen Performed at [facility], [place] [address], [place], [place] [address]    Report Status 07/20/2021 FINAL  Final  Urine Culture     Status: Abnormal   Collection Time: 07/22/21  2:30 PM   Specimen: Urine, Random  Result Value Ref Range Status   Specimen Description   Final    URINE, RANDOM Performed at [facility] [address], [place], [place] [address]    Special Requests   Final    NONE Performed at [facility], [place] [address], [place], [place] [address]    Culture (A)  Final    <10,000 COLONIES/mL INSIGNIFICANT GROWTH Performed at [facility] [address] [place], [place] [address]    Report Status 07/23/2021 FINAL  Final    Lab Basic Metabolic Panel: Recent Labs  Lab 07/20/21 0304 07/21/21 0530 07/22/21 0446 07/23/21 0435 07/24/21 0425 07/24/21 0636 2021/07/27 0510  NA 136 138 138 138 142 141 138  K 3.8 3.7 4.3 4.5 4.8 4.7 5.0  CL 103 107 104 106 107  --  101  CO2 17* 16* 16* 16* 17*  --  15*  GLUCOSE 194* 106* 187* 211* 90  --  109*  BUN 87* 86* 102* 117* 132*  --  134*  CREATININE 5.17* 4.61* 4.84* 4.79*  4.55*  --  4.61*  CALCIUM 6.9* 7.2* 7.9* 8.5* 9.2  --  8.8*  MG 1.7 1.9 2.4  --  2.5*  --  2.6*  PHOS 7.3*  --   --   --  8.8*  --  9.1*   Liver Function Tests: Recent Labs  Lab 07/20/21 0304 07/22/21 0446 07/23/21 0435  AST 24 15 18   ALT 10 8 11   ALKPHOS 94 86 97  BILITOT 0.9 0.7 0.6  PROT 5.4* 5.1* 5.5*  ALBUMIN 2.6* 2.3* 2.5*   No results for input(s): LIPASE, AMYLASE in the last 168 hours. No results for input(s): AMMONIA in the last 168 hours. CBC: Recent Labs  Lab 07/20/21 0304 07/21/21 0530 07/22/21 0446 07/23/21 0100 07/23/21 0435 07/24/21 0425 07/24/21 0636  WBC 18.1* 13.9* 10.4 12.9*  --  22.1*  --   NEUTROABS  --  11.7*  --   --   --   --   --   HGB 11.2* 10.8* 10.6* 10.4*  --  11.0* 10.5*  HCT 31.6* 30.3* 30.1* 30.2*  --  30.9*  31.0*  MCV 87.5 85.8 85.8 87.0  --  88.0  --   PLT 70* 51* 43* 45* 46* 60*  --    Cardiac Enzymes: No results for input(s): CKTOTAL, CKMB, CKMBINDEX, TROPONINI in the last 168 hours. Sepsis Labs: Recent Labs  Lab 07/21/21 0530 07/22/21 0446 07/23/21 0100 07/24/21 0425  PROCALCITON 15.43  --   --   --   WBC 13.9* 10.4 12.9* 22.1*  LATICACIDVEN  --  1.1  --   --     Procedures/Operations   Endotracheal intubation PICC insertion   Julian Hy 07/26/2021, 6:12 PM

## 2021-07-31 NOTE — Progress Notes (Signed)
Chaplain received a page from [place] that patient's heart rate was dropping quickly.  Chaplain provided emotional and spiritual support to Kayla [name]'s son and daughter as they sat with their mother, talking with her and letting her know that they loved them.  Family requested prayer for Kayla [name], as her faith was very important to her.  Chaplain provided prayer and ministry of presence.  Kayla [name], Kayla [name]'s daughter, works in [employer] and has been receiving support from the chaplain she works with.  That chaplain is coming in this evening to continue to support the family.  If there are additional needs that we can help meet, please page Kayla [name] back at [place], [place] Pager, [phone] 7:15 PM

## 2021-07-31 NOTE — Plan of Care (Signed)
  Wheelchair since June/July weakness Synthroid "made her sick" (nausea, vomiting, out of it, weakness) - months she didn't take it.      Interdisciplinary Goals of Care Family Meeting   Date carried out:: 28-Jul-2021  Location of the meeting: Phone conference  Member's involved: Nurse Practitioner and Family Member or next of kin   Durable Power of Attorney or acting medical decision maker: Daughter - Kayla [name].    Discussion: We discussed goals of care for Kayla [name]. Reviewed her understanding of patients critical illness. 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  She has not worked in some time.  The patient has been essentially wheelchair bound since June / July due to weakness.  She states she has not been taking her medications as she thought the meds were making her sick (causing weakness, nausea/vomiting, feeling "out of it" for months).  She had been declining at home.   Discussed patients clinical course since admission.  We discussed her persistent renal failure in depth and the fact that she is not a dialysis candidate per Nephrology and in addition to that, she previously did not want HD.  We reviewed her HR changes with turning and concern for overall decline in the last week despite being liberated from mechanical ventilation.  [name] does exhibit any signs of distress at this time.  Kayla states she feels her mother is "transitioning".  We reviewed code status and she is in agreement that DNR/DNI is in her mother's best interest.  She does not want to change current level of support at this time but if patient passes, no aggressive interventions.    Code status: Full DNR  Disposition: Continue current acute care.  If patient were to decline further, notify family.   Time spent for the meeting: ~20 minutes   Noe Gens, MSN, APRN, NP-C, AGACNP-BC [facility] Jul 28, 2021, 4:17 PM   Please see [email or web address] for pager details.   From 7A-7P  if no response, please call [phone] After hours, please call ELink [phone]

## 2021-07-31 NOTE — Progress Notes (Signed)
Patient's HR frequently dropping down to 40's, before returning to 60's. SBP remains 150's-170's. Pt is also more lethargic at this time, and is unable to follow commands. Patient's daughter called by this RN and updated, as well as patient's son who is at bedside. CCM aware of changes in patient's condition; pt was made DNR by family. This RN will continue to offer support to family, and assess pt for signs of discomfort.

## 2021-07-31 NOTE — Progress Notes (Signed)
Daughter at bedside.  RN reports patient has had increasing episodes of bradycardia - suspect this is profound metabolic acidosis in the setting of renal failure and inability to compensate from a respiratory standpoint.  Discussed with family at bedside. Patient appears more restless with increased work of breathing.  Reviewed with daughter the concept of pain / air hunger and utilization of pain medications to help with symptoms.  She indicates she would like to stop bipap and is ok for a small dose of pain medications. We reviewed that they are never used to hasten the natural process but only to treat symptom burden.  Pt treated with 1mg  IV morphine, bipap removed for comfort.  Saturations decreased immediately into the 70's with associated bradycardia.  Son and daughter at bedside.  Support offered.  Chaplain at bedside.  Family chaplain called for support.  No heart tones or spontaneous respirations, patient deceased 17. Verified with Kayla Sink, RN.  Stayed with children to offer support.      Kayla Gens, MSN, APRN, NP-C, AGACNP-BC [facility] 08/04/2021, 7:01 PM   Please see [email or web address] for pager details.   From 7A-7P if no response, please call [phone] After hours, please call ELink [phone]

## 2021-07-31 NOTE — Progress Notes (Signed)
When turning patient for bath, patient became increasingly hypoxic, O2 dropped to 70% on 15L HFNC. HR also dropped down to 39 bpm. Cleviprex gtt stopped by this RN d/t bradycardia, and pt was placed back on Bipap, 60% FiO2. SBP remains in 150's, HR now 60 bpm, and O2 sat 93% on bipap. Atropine at bedside, and Noe Gens, NP notified by this RN. This RN will continue to carefully monitor O2 sat and HR.

## 2021-07-31 DEATH — deceased

## 2021-08-06 IMAGING — DX PORTABLE CHEST - 1 VIEW
1 series · 1 of 1 positions shown · non-contrast
Comparison: April 28, 2019

CLINICAL DATA: Shortness of breath.

EXAM:
PORTABLE CHEST 1 VIEW

[chest ap]
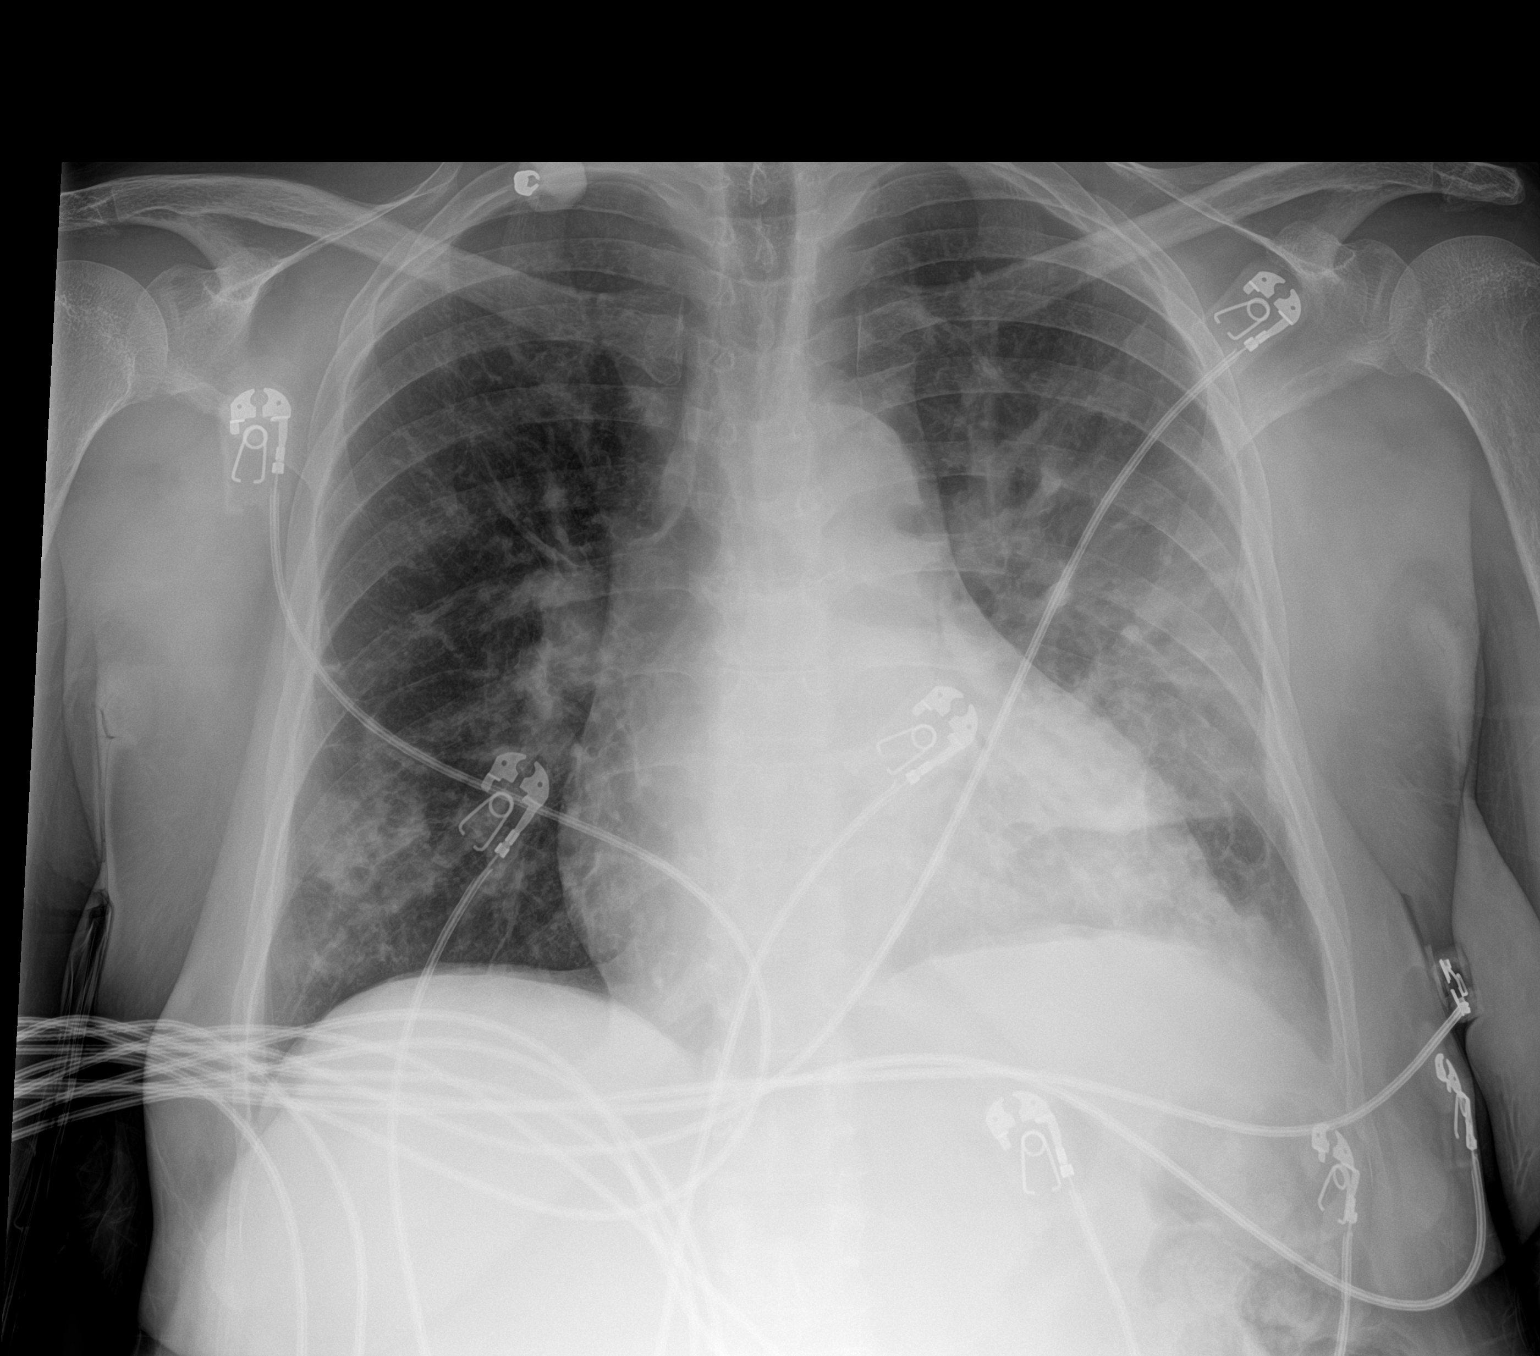

[1 of 1 positions shown; findings below may reference images not displayed]

FINDINGS: The cardiac silhouette is enlarged.

Persistent patchy bilateral airspace consolidations with left lung
and lower lobe predominance. Possible small left pleural effusions.

Osseous structures are without acute abnormality. Soft tissues are
grossly normal.
IMPRESSION: 1. Persistent patchy bilateral airspace consolidations with left
lung and lower lobe predominance.
2. Possible small left pleural effusion.

## 2022-07-08 IMAGING — CT CT ABD-PELV W/O CM
2 of 4 series · 16 of 46 positions shown, 18 images · non-contrast
Comparison: Renal ultrasound 07/07/2018. Right upper quadrant
abdominal ultrasound 05/05/2018. CT pelvis 06/30/2014.

CLINICAL DATA: Epigastric pain and weight loss. Family history of
pancreatic cancer.

EXAM:
CT ABDOMEN AND PELVIS WITHOUT CONTRAST
TECHNIQUE: Multidetector CT imaging of the abdomen and pelvis was performed
following the standard protocol without IV contrast.

[Series 2: axial st · axial · 0.69mm/px · z∈[-399,-59]mm · 13 of 77 slices shown, 15 images]
[im 5/77  soft-tissue]
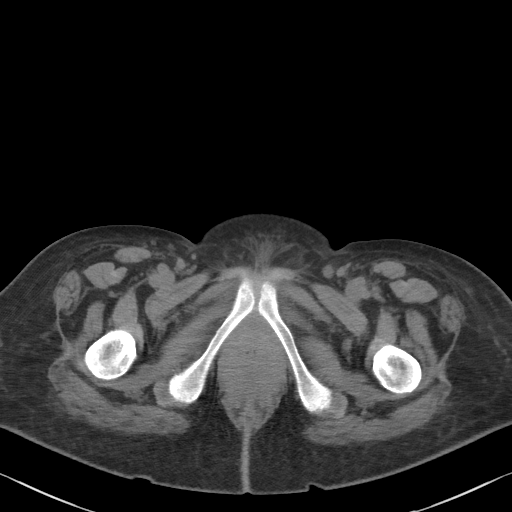
[im 5/77  bone]
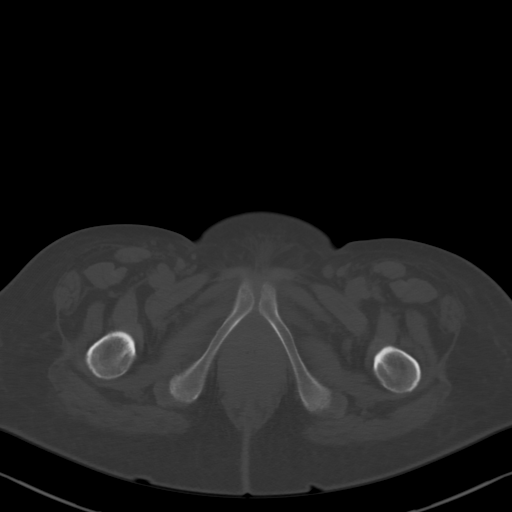
[im 13/77  soft-tissue]
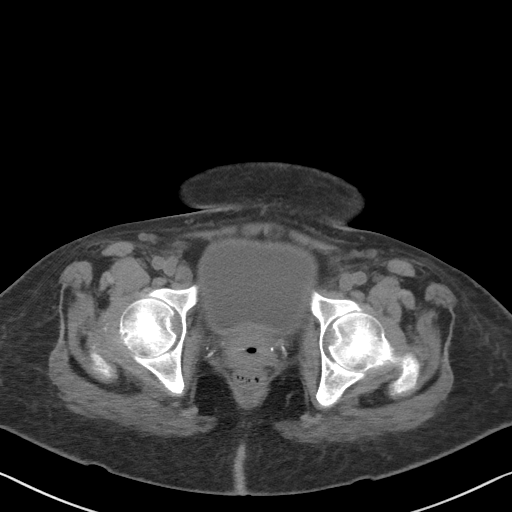
[im 17/77  soft-tissue]
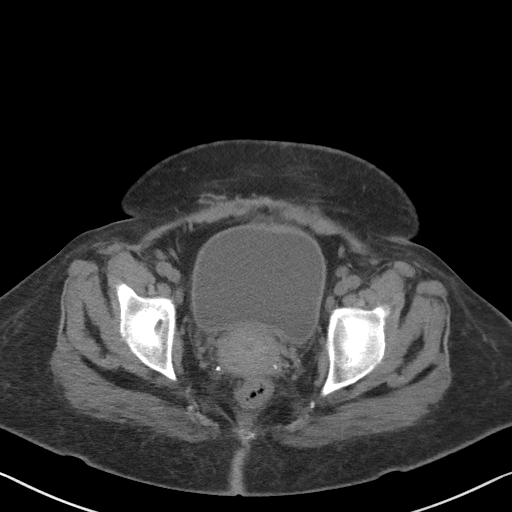
[im 21/77  soft-tissue]
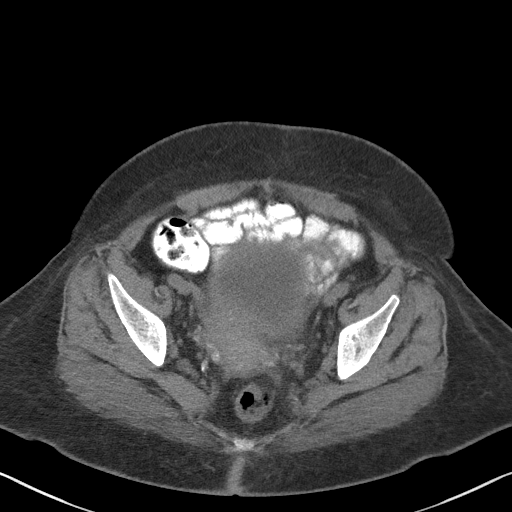
[im 29/77  soft-tissue]
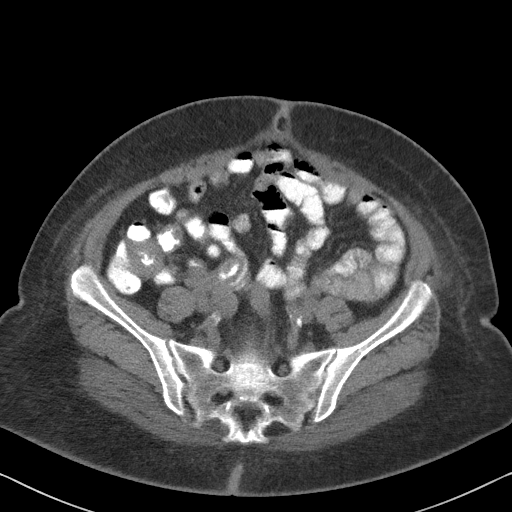
[im 33/77  soft-tissue]
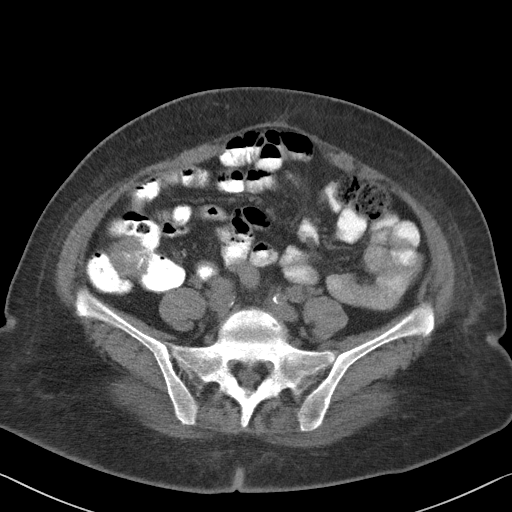
[im 41/77  soft-tissue]
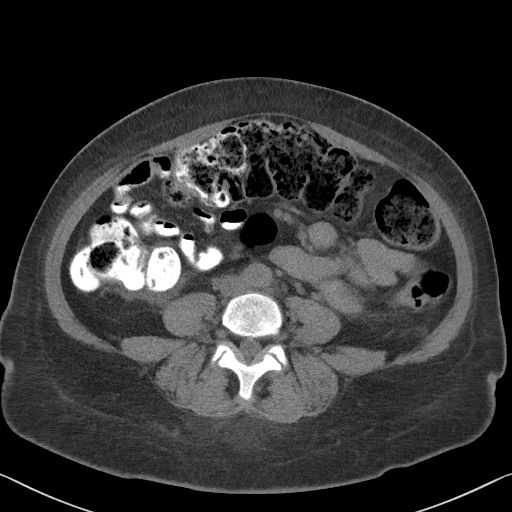
[im 45/77  soft-tissue]
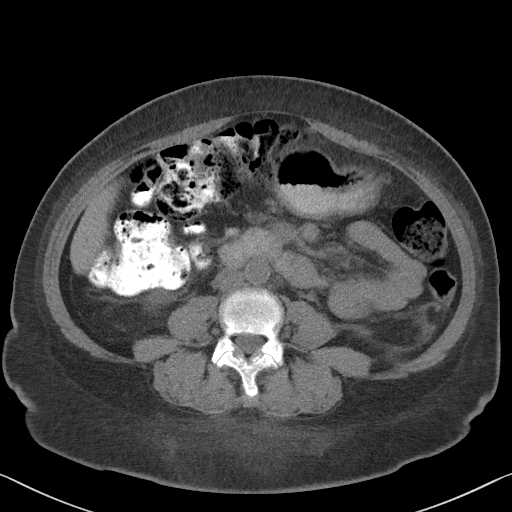
[im 49/77  soft-tissue]
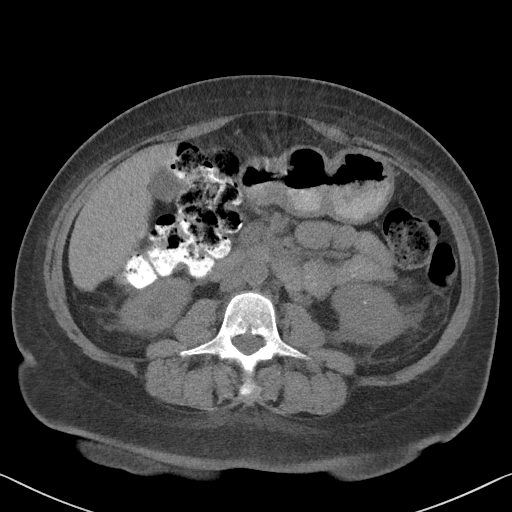
[im 49/77  bone]
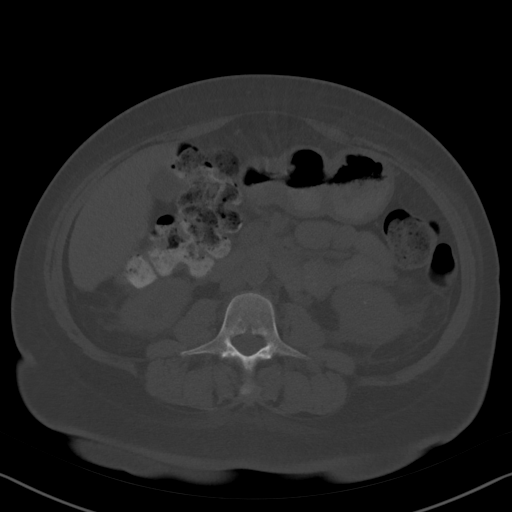
[im 57/77  soft-tissue]
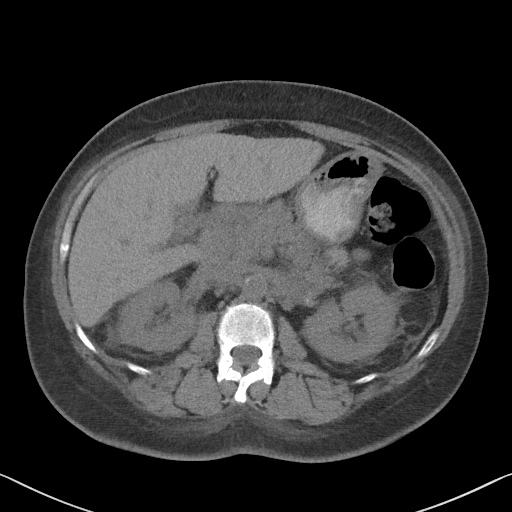
[im 61/77  soft-tissue]
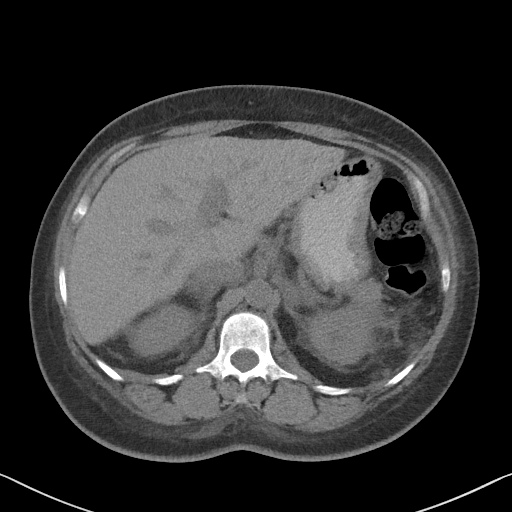
[im 65/77  soft-tissue]
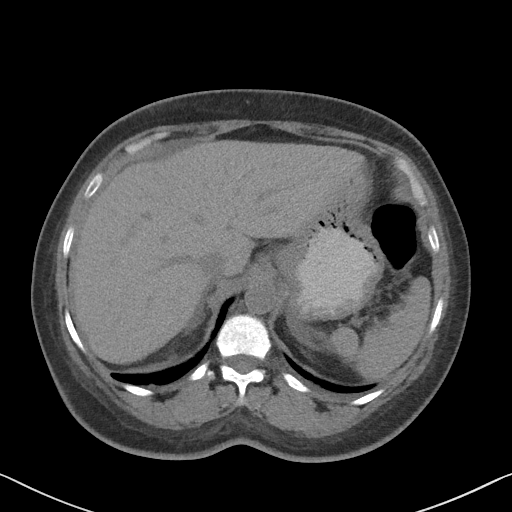
[im 73/77  soft-tissue]
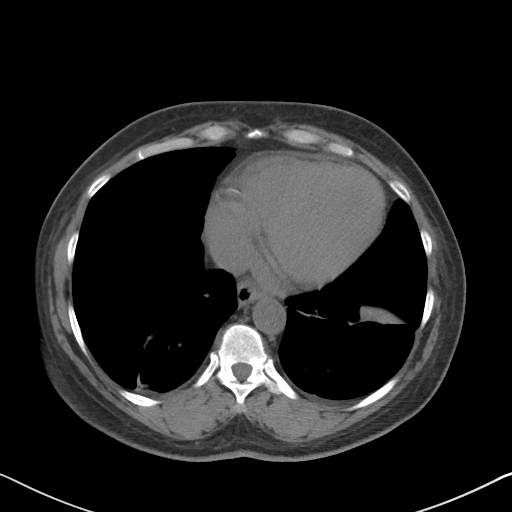

[Series 5: coronal st · coronal · 0.66mm/px · 3 of 89 slices shown]
[im 30/89  soft-tissue]
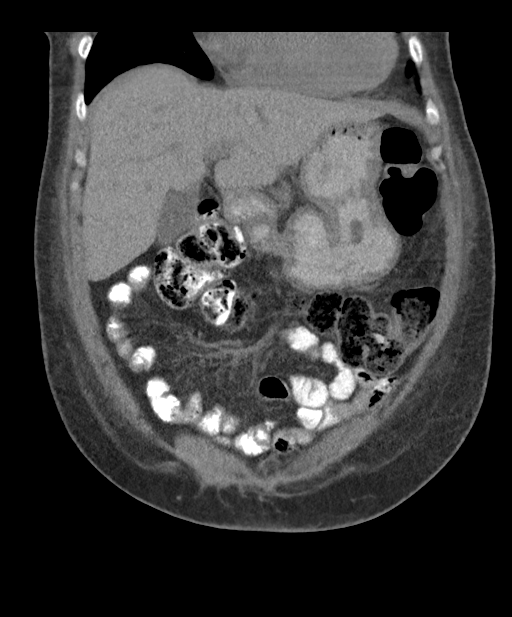
[im 40/89  soft-tissue]
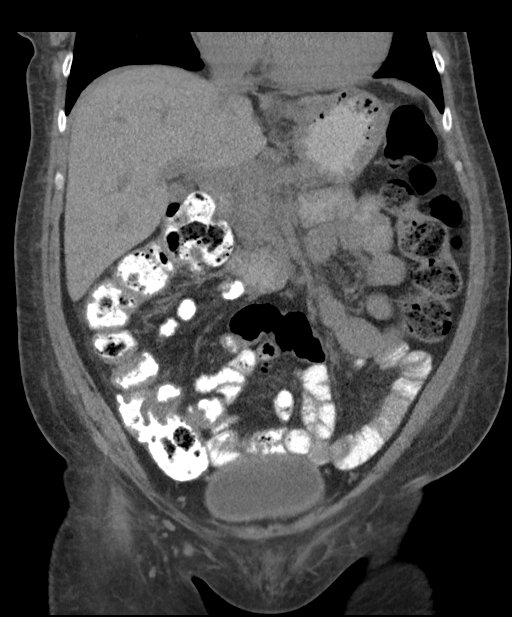
[im 49/89  soft-tissue]
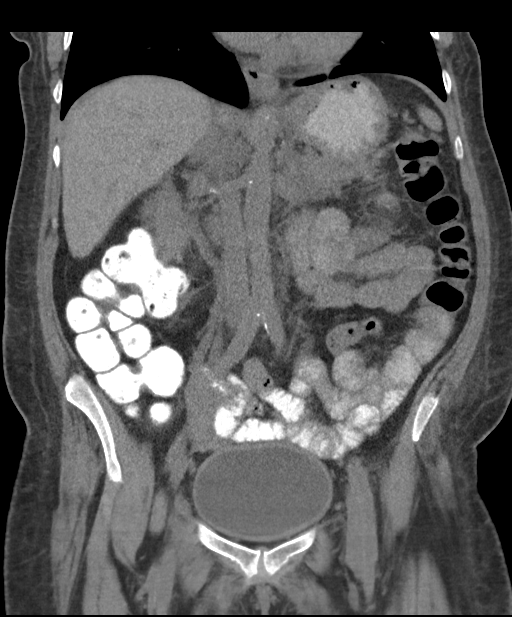

[16 of 46 positions shown; findings below may reference images not displayed]

FINDINGS: Lower chest: Bibasilar scarring including a partially imaged region
of confluent scarring and bronchiectasis in the left lower lobe.
Trace left pleural effusion. Mild cardiomegaly with trace
pericardial fluid.

Hepatobiliary: No focal liver abnormality is seen on this unenhanced
study. There is a punctate stone in the gallbladder. There is no
biliary dilatation.

Pancreas: Limited assessment for a mass on this unenhanced study. No
ductal dilatation or significant peripancreatic inflammation.

Spleen: Unremarkable.

Adrenals/Urinary Tract: Bilateral adrenal gland thickening. Single
punctate calculi in each kidney. No hydronephrosis. Moderate
nonspecific perinephric stranding bilaterally. Unremarkable bladder.

Stomach/Bowel: The stomach is unremarkable. There is a moderate
amount of stool in the ascending and transverse colon. There is no
evidence of bowel obstruction. There is wall thickening versus
underdistention of the terminal ileum without significant
surrounding inflammatory change.

Vascular/Lymphatic: Normal caliber of the abdominal aorta. Iliac
atherosclerosis. No enlarged lymph nodes.

Reproductive: Uterine vascular calcifications.  No adnexal mass.

Other: No ascites or pneumoperitoneum.

Musculoskeletal: No acute osseous abnormality or suspicious osseous
lesion.
IMPRESSION: 1. Possible wall thickening of the terminal ileum without
significant surrounding inflammatory change which may reflect mild
inflammatory or infectious ileitis versus underdistention.
2. Cholelithiasis.
3. Punctate nonobstructing bilateral renal calculi.
4. Nonspecific bilateral perinephric stranding.
5. Aortic Atherosclerosis ([id number]).

## 2023-09-22 NOTE — Telephone Encounter (Signed)
error
